# Patient Record
Sex: Female | Born: 1985 | Race: Black or African American | Hispanic: No | Marital: Single | Smoking: Never smoker
Health system: Southern US, Community
[De-identification: ages and names within clinical notes are randomized; demographics above are authoritative.]

## PROBLEM LIST (undated history)

## (undated) ENCOUNTER — Inpatient Hospital Stay (HOSPITAL_COMMUNITY)

## (undated) ENCOUNTER — Inpatient Hospital Stay (HOSPITAL_COMMUNITY): Payer: Self-pay

## (undated) DIAGNOSIS — R87629 Unspecified abnormal cytological findings in specimens from vagina: Secondary | ICD-10-CM

## (undated) DIAGNOSIS — D510 Vitamin B12 deficiency anemia due to intrinsic factor deficiency: Secondary | ICD-10-CM

## (undated) DIAGNOSIS — D219 Benign neoplasm of connective and other soft tissue, unspecified: Secondary | ICD-10-CM

## (undated) DIAGNOSIS — F419 Anxiety disorder, unspecified: Secondary | ICD-10-CM

## (undated) DIAGNOSIS — A549 Gonococcal infection, unspecified: Secondary | ICD-10-CM

## (undated) DIAGNOSIS — Z8719 Personal history of other diseases of the digestive system: Secondary | ICD-10-CM

## (undated) DIAGNOSIS — IMO0002 Reserved for concepts with insufficient information to code with codable children: Secondary | ICD-10-CM

## (undated) DIAGNOSIS — K219 Gastro-esophageal reflux disease without esophagitis: Secondary | ICD-10-CM

## (undated) DIAGNOSIS — B999 Unspecified infectious disease: Secondary | ICD-10-CM

## (undated) DIAGNOSIS — N83209 Unspecified ovarian cyst, unspecified side: Secondary | ICD-10-CM

## (undated) DIAGNOSIS — R51 Headache: Secondary | ICD-10-CM

## (undated) DIAGNOSIS — G35D Multiple sclerosis, unspecified: Secondary | ICD-10-CM

## (undated) DIAGNOSIS — E669 Obesity, unspecified: Secondary | ICD-10-CM

## (undated) DIAGNOSIS — N390 Urinary tract infection, site not specified: Secondary | ICD-10-CM

## (undated) DIAGNOSIS — O139 Gestational [pregnancy-induced] hypertension without significant proteinuria, unspecified trimester: Secondary | ICD-10-CM

## (undated) DIAGNOSIS — G35 Multiple sclerosis: Secondary | ICD-10-CM

## (undated) DIAGNOSIS — R519 Headache, unspecified: Secondary | ICD-10-CM

## (undated) DIAGNOSIS — G709 Myoneural disorder, unspecified: Secondary | ICD-10-CM

## (undated) DIAGNOSIS — R87619 Unspecified abnormal cytological findings in specimens from cervix uteri: Secondary | ICD-10-CM

## (undated) HISTORY — PX: WISDOM TOOTH EXTRACTION: SHX21

## (undated) HISTORY — DX: Multiple sclerosis: G35

## (undated) HISTORY — DX: Obesity, unspecified: E66.9

## (undated) HISTORY — DX: Multiple sclerosis, unspecified: G35.D

## (undated) HISTORY — DX: Vitamin B12 deficiency anemia due to intrinsic factor deficiency: D51.0

## (undated) HISTORY — PX: COLPOSCOPY: SHX161

## (undated) HISTORY — PX: TUBAL LIGATION: SHX77

---
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
## 2003-01-23 ENCOUNTER — Ambulatory Visit (HOSPITAL_COMMUNITY): Admission: RE | Admit: 2003-01-23 | Discharge: 2003-01-23 | Payer: Self-pay | Admitting: Family Medicine

## 2003-01-25 ENCOUNTER — Encounter: Admission: RE | Admit: 2003-01-25 | Discharge: 2003-01-25 | Payer: Self-pay | Admitting: Family Medicine

## 2003-02-26 ENCOUNTER — Encounter: Admission: RE | Admit: 2003-02-26 | Discharge: 2003-02-26 | Payer: Self-pay | Admitting: Family Medicine

## 2003-03-28 ENCOUNTER — Encounter: Admission: RE | Admit: 2003-03-28 | Discharge: 2003-03-28 | Payer: Self-pay | Admitting: Family Medicine

## 2003-04-10 ENCOUNTER — Encounter: Admission: RE | Admit: 2003-04-10 | Discharge: 2003-04-10 | Payer: Self-pay | Admitting: Sports Medicine

## 2003-04-17 ENCOUNTER — Encounter: Admission: RE | Admit: 2003-04-17 | Discharge: 2003-04-17 | Payer: Self-pay | Admitting: Family Medicine

## 2003-04-20 ENCOUNTER — Inpatient Hospital Stay (HOSPITAL_COMMUNITY): Admission: AD | Admit: 2003-04-20 | Discharge: 2003-04-20 | Payer: Self-pay | Admitting: Family Medicine

## 2003-04-21 ENCOUNTER — Inpatient Hospital Stay (HOSPITAL_COMMUNITY): Admission: AD | Admit: 2003-04-21 | Discharge: 2003-04-22 | Payer: Self-pay | Admitting: *Deleted

## 2003-04-27 ENCOUNTER — Encounter: Admission: RE | Admit: 2003-04-27 | Discharge: 2003-04-27 | Payer: Self-pay | Admitting: Family Medicine

## 2003-05-09 ENCOUNTER — Encounter: Admission: RE | Admit: 2003-05-09 | Discharge: 2003-05-09 | Payer: Self-pay | Admitting: Family Medicine

## 2003-05-24 ENCOUNTER — Encounter: Admission: RE | Admit: 2003-05-24 | Discharge: 2003-05-24 | Payer: Self-pay | Admitting: Family Medicine

## 2003-05-27 ENCOUNTER — Inpatient Hospital Stay (HOSPITAL_COMMUNITY): Admission: AD | Admit: 2003-05-27 | Discharge: 2003-05-27 | Payer: Self-pay | Admitting: Family Medicine

## 2003-05-30 ENCOUNTER — Inpatient Hospital Stay (HOSPITAL_COMMUNITY): Admission: AD | Admit: 2003-05-30 | Discharge: 2003-05-31 | Payer: Self-pay | Admitting: Obstetrics and Gynecology

## 2003-05-31 ENCOUNTER — Inpatient Hospital Stay (HOSPITAL_COMMUNITY): Admission: AD | Admit: 2003-05-31 | Discharge: 2003-05-31 | Payer: Self-pay | Admitting: Family Medicine

## 2003-05-31 ENCOUNTER — Encounter: Admission: RE | Admit: 2003-05-31 | Discharge: 2003-05-31 | Payer: Self-pay | Admitting: Family Medicine

## 2003-06-02 ENCOUNTER — Inpatient Hospital Stay (HOSPITAL_COMMUNITY): Admission: AD | Admit: 2003-06-02 | Discharge: 2003-06-06 | Payer: Self-pay | Admitting: Family Medicine

## 2003-06-08 ENCOUNTER — Inpatient Hospital Stay (HOSPITAL_COMMUNITY): Admission: AD | Admit: 2003-06-08 | Discharge: 2003-06-08 | Payer: Self-pay | Admitting: *Deleted

## 2003-06-11 ENCOUNTER — Inpatient Hospital Stay (HOSPITAL_COMMUNITY): Admission: AD | Admit: 2003-06-11 | Discharge: 2003-06-15 | Payer: Self-pay | Admitting: Obstetrics & Gynecology

## 2003-07-31 ENCOUNTER — Encounter (INDEPENDENT_AMBULATORY_CARE_PROVIDER_SITE_OTHER): Payer: Self-pay | Admitting: *Deleted

## 2003-07-31 ENCOUNTER — Encounter: Admission: RE | Admit: 2003-07-31 | Discharge: 2003-07-31 | Payer: Self-pay | Admitting: Sports Medicine

## 2003-07-31 ENCOUNTER — Other Ambulatory Visit: Admission: RE | Admit: 2003-07-31 | Discharge: 2003-07-31 | Payer: Self-pay | Admitting: Family Medicine

## 2003-09-17 ENCOUNTER — Encounter: Admission: RE | Admit: 2003-09-17 | Discharge: 2003-09-17 | Payer: Self-pay | Admitting: Family Medicine

## 2003-09-27 ENCOUNTER — Encounter: Admission: RE | Admit: 2003-09-27 | Discharge: 2003-09-27 | Payer: Self-pay | Admitting: Family Medicine

## 2004-01-17 ENCOUNTER — Encounter (INDEPENDENT_AMBULATORY_CARE_PROVIDER_SITE_OTHER): Payer: Self-pay | Admitting: Specialist

## 2004-01-17 ENCOUNTER — Other Ambulatory Visit: Admission: RE | Admit: 2004-01-17 | Discharge: 2004-01-17 | Payer: Self-pay | Admitting: Family Medicine

## 2004-01-17 ENCOUNTER — Encounter: Admission: RE | Admit: 2004-01-17 | Discharge: 2004-01-17 | Payer: Self-pay | Admitting: Family Medicine

## 2004-07-07 ENCOUNTER — Ambulatory Visit: Payer: Self-pay | Admitting: Family Medicine

## 2004-07-09 ENCOUNTER — Ambulatory Visit: Payer: Self-pay | Admitting: Family Medicine

## 2004-09-08 ENCOUNTER — Inpatient Hospital Stay (HOSPITAL_COMMUNITY): Admission: AD | Admit: 2004-09-08 | Discharge: 2004-09-08 | Payer: Self-pay | Admitting: Obstetrics & Gynecology

## 2004-09-11 ENCOUNTER — Inpatient Hospital Stay (HOSPITAL_COMMUNITY): Admission: AD | Admit: 2004-09-11 | Discharge: 2004-09-11 | Payer: Self-pay | Admitting: Obstetrics and Gynecology

## 2004-09-12 ENCOUNTER — Ambulatory Visit: Payer: Self-pay | Admitting: Sports Medicine

## 2004-09-26 ENCOUNTER — Emergency Department (HOSPITAL_COMMUNITY): Admission: EM | Admit: 2004-09-26 | Discharge: 2004-09-26 | Payer: Self-pay | Admitting: Emergency Medicine

## 2005-04-15 ENCOUNTER — Other Ambulatory Visit: Admission: RE | Admit: 2005-04-15 | Discharge: 2005-04-15 | Payer: Self-pay | Admitting: Family Medicine

## 2005-04-15 ENCOUNTER — Encounter (INDEPENDENT_AMBULATORY_CARE_PROVIDER_SITE_OTHER): Payer: Self-pay | Admitting: *Deleted

## 2005-04-15 ENCOUNTER — Ambulatory Visit: Payer: Self-pay | Admitting: Family Medicine

## 2005-04-15 LAB — CONVERTED CEMR LAB

## 2005-04-25 ENCOUNTER — Inpatient Hospital Stay (HOSPITAL_COMMUNITY): Admission: AD | Admit: 2005-04-25 | Discharge: 2005-04-25 | Payer: Self-pay | Admitting: Obstetrics and Gynecology

## 2005-08-21 ENCOUNTER — Inpatient Hospital Stay (HOSPITAL_COMMUNITY): Admission: AD | Admit: 2005-08-21 | Discharge: 2005-08-21 | Payer: Self-pay | Admitting: Obstetrics and Gynecology

## 2005-09-10 ENCOUNTER — Emergency Department (HOSPITAL_COMMUNITY): Admission: EM | Admit: 2005-09-10 | Discharge: 2005-09-10 | Payer: Self-pay | Admitting: Family Medicine

## 2005-10-15 ENCOUNTER — Inpatient Hospital Stay (HOSPITAL_COMMUNITY): Admission: AD | Admit: 2005-10-15 | Discharge: 2005-10-16 | Payer: Self-pay | Admitting: Gynecology

## 2005-10-30 ENCOUNTER — Ambulatory Visit: Payer: Self-pay | Admitting: Sports Medicine

## 2006-01-18 ENCOUNTER — Inpatient Hospital Stay (HOSPITAL_COMMUNITY): Admission: AD | Admit: 2006-01-18 | Discharge: 2006-01-18 | Payer: Self-pay | Admitting: Obstetrics & Gynecology

## 2006-01-22 ENCOUNTER — Ambulatory Visit: Payer: Self-pay | Admitting: Family Medicine

## 2006-08-12 DIAGNOSIS — E669 Obesity, unspecified: Secondary | ICD-10-CM | POA: Insufficient documentation

## 2006-08-13 ENCOUNTER — Encounter (INDEPENDENT_AMBULATORY_CARE_PROVIDER_SITE_OTHER): Payer: Self-pay | Admitting: *Deleted

## 2006-10-13 ENCOUNTER — Encounter (INDEPENDENT_AMBULATORY_CARE_PROVIDER_SITE_OTHER): Payer: Self-pay | Admitting: Specialist

## 2006-10-13 ENCOUNTER — Ambulatory Visit: Payer: Self-pay | Admitting: Family Medicine

## 2006-10-13 ENCOUNTER — Other Ambulatory Visit: Admission: RE | Admit: 2006-10-13 | Discharge: 2006-10-13 | Payer: Self-pay | Admitting: Family Medicine

## 2006-10-13 ENCOUNTER — Encounter (INDEPENDENT_AMBULATORY_CARE_PROVIDER_SITE_OTHER): Payer: Self-pay | Admitting: Family Medicine

## 2006-10-13 LAB — CONVERTED CEMR LAB
FSH: 3 milliintl units/mL
GC Probe Amp, Genital: NEGATIVE
LH: 2.2 milliintl units/mL
RBC: 3.86 M/uL
WBC: 5.4 10*3/uL
Whiff Test: POSITIVE

## 2006-10-14 ENCOUNTER — Encounter: Payer: Self-pay | Admitting: *Deleted

## 2006-10-15 ENCOUNTER — Ambulatory Visit: Payer: Self-pay | Admitting: Family Medicine

## 2006-10-15 ENCOUNTER — Encounter (INDEPENDENT_AMBULATORY_CARE_PROVIDER_SITE_OTHER): Payer: Self-pay | Admitting: Family Medicine

## 2006-10-15 ENCOUNTER — Encounter (INDEPENDENT_AMBULATORY_CARE_PROVIDER_SITE_OTHER): Payer: Self-pay | Admitting: *Deleted

## 2006-10-19 ENCOUNTER — Encounter (INDEPENDENT_AMBULATORY_CARE_PROVIDER_SITE_OTHER): Payer: Self-pay | Admitting: Family Medicine

## 2006-10-20 ENCOUNTER — Telehealth (INDEPENDENT_AMBULATORY_CARE_PROVIDER_SITE_OTHER): Payer: Self-pay | Admitting: *Deleted

## 2006-10-26 ENCOUNTER — Encounter: Admission: RE | Admit: 2006-10-26 | Discharge: 2006-10-26 | Payer: Self-pay | Admitting: Sports Medicine

## 2006-10-31 ENCOUNTER — Inpatient Hospital Stay (HOSPITAL_COMMUNITY): Admission: AD | Admit: 2006-10-31 | Discharge: 2006-10-31 | Payer: Self-pay | Admitting: Family Medicine

## 2006-11-01 ENCOUNTER — Ambulatory Visit: Payer: Self-pay | Admitting: Family Medicine

## 2006-11-01 ENCOUNTER — Telehealth: Payer: Self-pay | Admitting: *Deleted

## 2006-11-19 ENCOUNTER — Ambulatory Visit: Payer: Self-pay | Admitting: Family Medicine

## 2006-11-22 ENCOUNTER — Telehealth: Payer: Self-pay | Admitting: *Deleted

## 2006-11-29 ENCOUNTER — Encounter (INDEPENDENT_AMBULATORY_CARE_PROVIDER_SITE_OTHER): Payer: Self-pay | Admitting: *Deleted

## 2006-11-29 ENCOUNTER — Telehealth (INDEPENDENT_AMBULATORY_CARE_PROVIDER_SITE_OTHER): Payer: Self-pay | Admitting: *Deleted

## 2006-11-29 ENCOUNTER — Encounter (INDEPENDENT_AMBULATORY_CARE_PROVIDER_SITE_OTHER): Payer: Self-pay | Admitting: Family Medicine

## 2007-01-13 ENCOUNTER — Inpatient Hospital Stay (HOSPITAL_COMMUNITY): Admission: AD | Admit: 2007-01-13 | Discharge: 2007-01-13 | Payer: Self-pay | Admitting: Obstetrics and Gynecology

## 2007-06-27 ENCOUNTER — Inpatient Hospital Stay (HOSPITAL_COMMUNITY): Admission: AD | Admit: 2007-06-27 | Discharge: 2007-06-27 | Payer: Self-pay | Admitting: Family Medicine

## 2007-07-07 ENCOUNTER — Ambulatory Visit: Payer: Self-pay | Admitting: Family Medicine

## 2007-07-10 ENCOUNTER — Inpatient Hospital Stay (HOSPITAL_COMMUNITY): Admission: AD | Admit: 2007-07-10 | Discharge: 2007-07-10 | Payer: Self-pay | Admitting: Obstetrics & Gynecology

## 2007-07-29 ENCOUNTER — Ambulatory Visit: Payer: Self-pay | Admitting: Family Medicine

## 2007-07-29 DIAGNOSIS — R109 Unspecified abdominal pain: Secondary | ICD-10-CM | POA: Insufficient documentation

## 2007-08-01 ENCOUNTER — Telehealth: Payer: Self-pay | Admitting: *Deleted

## 2007-08-02 ENCOUNTER — Telehealth: Payer: Self-pay | Admitting: Family Medicine

## 2007-08-17 ENCOUNTER — Inpatient Hospital Stay (HOSPITAL_COMMUNITY): Admission: AD | Admit: 2007-08-17 | Discharge: 2007-08-18 | Payer: Self-pay | Admitting: Obstetrics & Gynecology

## 2007-08-18 ENCOUNTER — Telehealth: Payer: Self-pay | Admitting: Family Medicine

## 2007-08-19 ENCOUNTER — Ambulatory Visit: Payer: Self-pay | Admitting: Family Medicine

## 2007-09-05 ENCOUNTER — Encounter: Payer: Self-pay | Admitting: *Deleted

## 2007-10-11 ENCOUNTER — Inpatient Hospital Stay (HOSPITAL_COMMUNITY): Admission: AD | Admit: 2007-10-11 | Discharge: 2007-10-11 | Payer: Self-pay | Admitting: Gynecology

## 2007-10-14 ENCOUNTER — Telehealth: Payer: Self-pay | Admitting: *Deleted

## 2007-10-18 ENCOUNTER — Inpatient Hospital Stay (HOSPITAL_COMMUNITY): Admission: AD | Admit: 2007-10-18 | Discharge: 2007-10-18 | Payer: Self-pay | Admitting: Family Medicine

## 2007-10-20 ENCOUNTER — Telehealth: Payer: Self-pay | Admitting: *Deleted

## 2007-10-20 ENCOUNTER — Inpatient Hospital Stay (HOSPITAL_COMMUNITY): Admission: AD | Admit: 2007-10-20 | Discharge: 2007-10-20 | Payer: Self-pay | Admitting: Family Medicine

## 2007-10-25 ENCOUNTER — Encounter: Payer: Self-pay | Admitting: Family Medicine

## 2007-10-25 ENCOUNTER — Ambulatory Visit: Payer: Self-pay | Admitting: Family Medicine

## 2007-10-25 LAB — CONVERTED CEMR LAB
Antibody Screen: NEGATIVE
Basophils Relative: 0 % (ref 0–1)
Eosinophils Absolute: 0 10*3/uL (ref 0.0–0.7)
Eosinophils Relative: 1 % (ref 0–5)
HCT: 37.8 % (ref 36.0–46.0)
Lymphs Abs: 2.5 10*3/uL (ref 0.7–4.0)
MCHC: 31.7 g/dL (ref 30.0–36.0)
MCV: 96.4 fL (ref 78.0–100.0)
Monocytes Relative: 10 % (ref 3–12)
Platelets: 479 10*3/uL — ABNORMAL HIGH (ref 150–400)
RBC: 3.92 M/uL (ref 3.87–5.11)
Rh Type: POSITIVE
WBC: 7.4 10*3/uL (ref 4.0–10.5)

## 2007-10-29 ENCOUNTER — Inpatient Hospital Stay (HOSPITAL_COMMUNITY): Admission: AD | Admit: 2007-10-29 | Discharge: 2007-10-29 | Payer: Self-pay | Admitting: Gynecology

## 2007-11-01 ENCOUNTER — Encounter: Payer: Self-pay | Admitting: Family Medicine

## 2007-11-01 ENCOUNTER — Other Ambulatory Visit: Admission: RE | Admit: 2007-11-01 | Discharge: 2007-11-01 | Payer: Self-pay | Admitting: Family Medicine

## 2007-11-01 ENCOUNTER — Ambulatory Visit: Payer: Self-pay | Admitting: Family Medicine

## 2007-11-01 LAB — CONVERTED CEMR LAB: Pap Smear: NORMAL

## 2007-11-02 ENCOUNTER — Ambulatory Visit: Payer: Self-pay | Admitting: Family Medicine

## 2007-11-02 LAB — CONVERTED CEMR LAB
GC Probe Amp, Genital: POSITIVE — AB
hCG, Beta Chain, Quant, S: 66400.1 milliintl units/mL

## 2007-11-04 ENCOUNTER — Telehealth: Payer: Self-pay | Admitting: *Deleted

## 2007-11-04 ENCOUNTER — Encounter: Payer: Self-pay | Admitting: Family Medicine

## 2007-11-05 ENCOUNTER — Telehealth: Payer: Self-pay | Admitting: Family Medicine

## 2007-11-06 ENCOUNTER — Ambulatory Visit: Payer: Self-pay | Admitting: Family Medicine

## 2007-11-06 ENCOUNTER — Inpatient Hospital Stay (HOSPITAL_COMMUNITY): Admission: AD | Admit: 2007-11-06 | Discharge: 2007-11-09 | Payer: Self-pay | Admitting: Family Medicine

## 2007-11-15 ENCOUNTER — Telehealth: Payer: Self-pay | Admitting: *Deleted

## 2007-11-15 ENCOUNTER — Inpatient Hospital Stay (HOSPITAL_COMMUNITY): Admission: AD | Admit: 2007-11-15 | Discharge: 2007-11-22 | Payer: Self-pay | Admitting: Gynecology

## 2007-11-15 ENCOUNTER — Ambulatory Visit: Payer: Self-pay | Admitting: *Deleted

## 2007-11-21 ENCOUNTER — Telehealth: Payer: Self-pay | Admitting: Family Medicine

## 2007-11-22 ENCOUNTER — Encounter: Payer: Self-pay | Admitting: Family Medicine

## 2007-11-24 ENCOUNTER — Ambulatory Visit: Payer: Self-pay | Admitting: Family Medicine

## 2007-11-30 ENCOUNTER — Inpatient Hospital Stay (HOSPITAL_COMMUNITY): Admission: AD | Admit: 2007-11-30 | Discharge: 2007-11-30 | Payer: Self-pay | Admitting: Family Medicine

## 2007-11-30 ENCOUNTER — Ambulatory Visit: Payer: Self-pay | Admitting: Physician Assistant

## 2007-12-01 ENCOUNTER — Telehealth: Payer: Self-pay | Admitting: *Deleted

## 2007-12-01 ENCOUNTER — Ambulatory Visit: Payer: Self-pay | Admitting: Obstetrics & Gynecology

## 2007-12-06 ENCOUNTER — Ambulatory Visit: Payer: Self-pay | Admitting: Family Medicine

## 2007-12-06 LAB — CONVERTED CEMR LAB: Protein, U semiquant: NEGATIVE

## 2007-12-07 ENCOUNTER — Inpatient Hospital Stay (HOSPITAL_COMMUNITY): Admission: AD | Admit: 2007-12-07 | Discharge: 2007-12-08 | Payer: Self-pay | Admitting: Obstetrics and Gynecology

## 2007-12-15 ENCOUNTER — Inpatient Hospital Stay (HOSPITAL_COMMUNITY): Admission: AD | Admit: 2007-12-15 | Discharge: 2007-12-15 | Payer: Self-pay | Admitting: Obstetrics & Gynecology

## 2007-12-15 ENCOUNTER — Ambulatory Visit: Payer: Self-pay | Admitting: Obstetrics & Gynecology

## 2007-12-29 ENCOUNTER — Ambulatory Visit: Payer: Self-pay | Admitting: *Deleted

## 2008-01-09 ENCOUNTER — Ambulatory Visit: Payer: Self-pay | Admitting: Family Medicine

## 2008-01-09 ENCOUNTER — Encounter: Payer: Self-pay | Admitting: Family Medicine

## 2008-01-10 ENCOUNTER — Telehealth: Payer: Self-pay | Admitting: Family Medicine

## 2008-01-11 ENCOUNTER — Encounter: Payer: Self-pay | Admitting: Family Medicine

## 2008-01-12 ENCOUNTER — Encounter: Payer: Self-pay | Admitting: Family Medicine

## 2008-01-19 ENCOUNTER — Ambulatory Visit (HOSPITAL_COMMUNITY): Admission: RE | Admit: 2008-01-19 | Discharge: 2008-01-19 | Payer: Self-pay | Admitting: Family Medicine

## 2008-01-19 ENCOUNTER — Telehealth: Payer: Self-pay | Admitting: Family Medicine

## 2008-01-19 ENCOUNTER — Encounter: Payer: Self-pay | Admitting: Family Medicine

## 2008-01-20 ENCOUNTER — Ambulatory Visit: Payer: Self-pay | Admitting: Family Medicine

## 2008-01-20 ENCOUNTER — Encounter (INDEPENDENT_AMBULATORY_CARE_PROVIDER_SITE_OTHER): Payer: Self-pay | Admitting: *Deleted

## 2008-01-22 ENCOUNTER — Inpatient Hospital Stay (HOSPITAL_COMMUNITY): Admission: AD | Admit: 2008-01-22 | Discharge: 2008-01-22 | Payer: Self-pay | Admitting: Gynecology

## 2008-01-24 ENCOUNTER — Encounter: Payer: Self-pay | Admitting: Family Medicine

## 2008-02-02 ENCOUNTER — Ambulatory Visit (HOSPITAL_COMMUNITY): Admission: RE | Admit: 2008-02-02 | Discharge: 2008-02-02 | Payer: Self-pay | Admitting: Family Medicine

## 2008-02-07 ENCOUNTER — Ambulatory Visit: Payer: Self-pay | Admitting: Family Medicine

## 2008-02-07 LAB — CONVERTED CEMR LAB: Glucose, Urine, Semiquant: NEGATIVE

## 2008-02-24 ENCOUNTER — Ambulatory Visit: Payer: Self-pay | Admitting: Family Medicine

## 2008-02-24 LAB — CONVERTED CEMR LAB
Bilirubin Urine: NEGATIVE
Glucose, Urine, Semiquant: 100
Ketones, urine, test strip: NEGATIVE
pH: 7

## 2008-03-07 ENCOUNTER — Ambulatory Visit: Payer: Self-pay | Admitting: Family Medicine

## 2008-03-07 LAB — CONVERTED CEMR LAB: Glucose, Urine, Semiquant: NEGATIVE

## 2008-03-19 ENCOUNTER — Telehealth: Payer: Self-pay | Admitting: Family Medicine

## 2008-03-20 ENCOUNTER — Telehealth: Payer: Self-pay | Admitting: Family Medicine

## 2008-03-21 ENCOUNTER — Encounter: Payer: Self-pay | Admitting: Family Medicine

## 2008-03-21 ENCOUNTER — Ambulatory Visit: Payer: Self-pay | Admitting: Family Medicine

## 2008-03-21 DIAGNOSIS — IMO0002 Reserved for concepts with insufficient information to code with codable children: Secondary | ICD-10-CM | POA: Insufficient documentation

## 2008-03-21 LAB — CONVERTED CEMR LAB
Albumin: 3.5 g/dL (ref 3.5–5.2)
CO2: 21 meq/L (ref 19–32)
Glucose, Bld: 96 mg/dL (ref 70–99)
LDH: 124 units/L (ref 94–250)
MCV: 95.3 fL (ref 78.0–100.0)
Potassium: 4.3 meq/L (ref 3.5–5.3)
RBC: 3.22 M/uL — ABNORMAL LOW (ref 3.87–5.11)
Sodium: 138 meq/L (ref 135–145)
Total Bilirubin: 0.3 mg/dL (ref 0.3–1.2)
Total Protein: 6.5 g/dL (ref 6.0–8.3)
Uric Acid, Serum: 2.9 mg/dL (ref 2.4–7.0)
WBC: 8.7 10*3/uL (ref 4.0–10.5)

## 2008-03-23 ENCOUNTER — Encounter: Payer: Self-pay | Admitting: Family Medicine

## 2008-03-28 ENCOUNTER — Ambulatory Visit: Payer: Self-pay | Admitting: Family Medicine

## 2008-03-28 ENCOUNTER — Encounter: Payer: Self-pay | Admitting: Family Medicine

## 2008-04-03 ENCOUNTER — Inpatient Hospital Stay (HOSPITAL_COMMUNITY): Admission: AD | Admit: 2008-04-03 | Discharge: 2008-04-04 | Payer: Self-pay | Admitting: Obstetrics & Gynecology

## 2008-04-03 ENCOUNTER — Ambulatory Visit: Payer: Self-pay | Admitting: Advanced Practice Midwife

## 2008-04-04 ENCOUNTER — Telehealth: Payer: Self-pay | Admitting: *Deleted

## 2008-04-05 ENCOUNTER — Inpatient Hospital Stay (HOSPITAL_COMMUNITY): Admission: AD | Admit: 2008-04-05 | Discharge: 2008-04-05 | Payer: Self-pay | Admitting: Obstetrics and Gynecology

## 2008-04-09 ENCOUNTER — Telehealth: Payer: Self-pay | Admitting: *Deleted

## 2008-04-11 ENCOUNTER — Ambulatory Visit: Payer: Self-pay | Admitting: Family Medicine

## 2008-04-26 ENCOUNTER — Ambulatory Visit: Payer: Self-pay | Admitting: Family Medicine

## 2008-04-26 ENCOUNTER — Encounter: Payer: Self-pay | Admitting: Family Medicine

## 2008-04-26 LAB — CONVERTED CEMR LAB
ALT: <8 units/L (ref 0–35)
Albumin: 3.6 g/dL (ref 3.5–5.2)
Bilirubin Urine: NEGATIVE
Bilirubin, Direct: 0.1 mg/dL (ref 0.0–0.3)
Glucose, Urine, Semiquant: NEGATIVE
Hemoglobin: 9.7 g/dL — ABNORMAL LOW (ref 12.0–15.0)
Ketones, urine, test strip: NEGATIVE
MCHC: 30.1 g/dL (ref 30.0–36.0)
Nitrite: NEGATIVE
RDW: 13.5 % (ref 11.5–15.5)
Total Bilirubin: 0.5 mg/dL (ref 0.3–1.2)
Urobilinogen, UA: 1
WBC Urine, dipstick: NEGATIVE

## 2008-04-27 ENCOUNTER — Encounter: Payer: Self-pay | Admitting: Family Medicine

## 2008-04-27 ENCOUNTER — Inpatient Hospital Stay (HOSPITAL_COMMUNITY): Admission: AD | Admit: 2008-04-27 | Discharge: 2008-04-29 | Payer: Self-pay | Admitting: Obstetrics & Gynecology

## 2008-04-27 ENCOUNTER — Ambulatory Visit: Payer: Self-pay | Admitting: Obstetrics and Gynecology

## 2008-05-01 ENCOUNTER — Telehealth: Payer: Self-pay | Admitting: *Deleted

## 2008-05-01 ENCOUNTER — Ambulatory Visit: Payer: Self-pay | Admitting: Family Medicine

## 2008-05-08 ENCOUNTER — Encounter: Payer: Self-pay | Admitting: Family Medicine

## 2008-05-08 ENCOUNTER — Ambulatory Visit: Payer: Self-pay | Admitting: Family Medicine

## 2008-05-15 ENCOUNTER — Ambulatory Visit: Payer: Self-pay | Admitting: Advanced Practice Midwife

## 2008-05-15 ENCOUNTER — Telehealth: Payer: Self-pay | Admitting: *Deleted

## 2008-05-15 ENCOUNTER — Ambulatory Visit: Payer: Self-pay | Admitting: Family Medicine

## 2008-05-15 ENCOUNTER — Inpatient Hospital Stay (HOSPITAL_COMMUNITY): Admission: AD | Admit: 2008-05-15 | Discharge: 2008-05-15 | Payer: Self-pay | Admitting: Family Medicine

## 2008-05-17 ENCOUNTER — Telehealth: Payer: Self-pay | Admitting: *Deleted

## 2008-05-17 ENCOUNTER — Ambulatory Visit: Payer: Self-pay | Admitting: Physician Assistant

## 2008-05-17 ENCOUNTER — Inpatient Hospital Stay (HOSPITAL_COMMUNITY): Admission: AD | Admit: 2008-05-17 | Discharge: 2008-05-17 | Payer: Self-pay | Admitting: Obstetrics & Gynecology

## 2008-05-17 ENCOUNTER — Telehealth: Payer: Self-pay | Admitting: Family Medicine

## 2008-05-18 ENCOUNTER — Telehealth: Payer: Self-pay | Admitting: Family Medicine

## 2008-05-21 ENCOUNTER — Inpatient Hospital Stay (HOSPITAL_COMMUNITY): Admission: AD | Admit: 2008-05-21 | Discharge: 2008-05-23 | Payer: Self-pay | Admitting: Obstetrics & Gynecology

## 2008-05-21 ENCOUNTER — Ambulatory Visit: Payer: Self-pay | Admitting: Family Medicine

## 2008-05-21 ENCOUNTER — Encounter: Payer: Self-pay | Admitting: Family Medicine

## 2008-05-21 LAB — CONVERTED CEMR LAB

## 2008-05-22 LAB — CONVERTED CEMR LAB
BUN: 5 mg/dL — ABNORMAL LOW (ref 6–23)
CO2: 21 meq/L (ref 19–32)
Calcium: 8.7 mg/dL (ref 8.4–10.5)
Chloride: 105 meq/L (ref 96–112)
Creatinine, Ser: 0.56 mg/dL (ref 0.40–1.20)
HCT: 30 % — ABNORMAL LOW (ref 36.0–46.0)
Hemoglobin: 8.9 g/dL — ABNORMAL LOW (ref 12.0–15.0)
MCV: 98 fL (ref 78.0–100.0)
RDW: 13.9 % (ref 11.5–15.5)
Total Bilirubin: 0.4 mg/dL (ref 0.3–1.2)
Uric Acid, Serum: 3.5 mg/dL (ref 2.4–7.0)
WBC: 5.8 10*3/uL (ref 4.0–10.5)

## 2008-05-25 ENCOUNTER — Ambulatory Visit (HOSPITAL_COMMUNITY): Admission: RE | Admit: 2008-05-25 | Discharge: 2008-05-25 | Payer: Self-pay | Admitting: Obstetrics & Gynecology

## 2008-05-28 ENCOUNTER — Ambulatory Visit: Payer: Self-pay | Admitting: Family Medicine

## 2008-05-30 ENCOUNTER — Inpatient Hospital Stay (HOSPITAL_COMMUNITY): Admission: AD | Admit: 2008-05-30 | Discharge: 2008-06-02 | Payer: Self-pay | Admitting: Obstetrics and Gynecology

## 2008-05-30 ENCOUNTER — Ambulatory Visit: Payer: Self-pay | Admitting: Physician Assistant

## 2008-06-18 ENCOUNTER — Telehealth (INDEPENDENT_AMBULATORY_CARE_PROVIDER_SITE_OTHER): Payer: Self-pay | Admitting: *Deleted

## 2008-07-09 ENCOUNTER — Telehealth: Payer: Self-pay | Admitting: Family Medicine

## 2008-07-09 ENCOUNTER — Encounter: Payer: Self-pay | Admitting: Family Medicine

## 2008-07-10 ENCOUNTER — Encounter: Payer: Self-pay | Admitting: Family Medicine

## 2008-07-10 ENCOUNTER — Telehealth (INDEPENDENT_AMBULATORY_CARE_PROVIDER_SITE_OTHER): Payer: Self-pay | Admitting: *Deleted

## 2008-07-19 ENCOUNTER — Ambulatory Visit: Payer: Self-pay | Admitting: Family Medicine

## 2008-07-20 ENCOUNTER — Telehealth: Payer: Self-pay | Admitting: Family Medicine

## 2008-07-23 ENCOUNTER — Encounter: Payer: Self-pay | Admitting: Family Medicine

## 2008-07-23 ENCOUNTER — Ambulatory Visit: Payer: Self-pay | Admitting: Family Medicine

## 2008-07-23 DIAGNOSIS — R1115 Cyclical vomiting syndrome unrelated to migraine: Secondary | ICD-10-CM

## 2008-07-23 LAB — CONVERTED CEMR LAB: Beta hcg, urine, semiquantitative: NEGATIVE

## 2008-10-19 ENCOUNTER — Telehealth: Payer: Self-pay | Admitting: *Deleted

## 2008-10-29 ENCOUNTER — Telehealth: Payer: Self-pay | Admitting: Family Medicine

## 2008-11-26 ENCOUNTER — Inpatient Hospital Stay (HOSPITAL_COMMUNITY): Admission: AD | Admit: 2008-11-26 | Discharge: 2008-11-26 | Payer: Self-pay | Admitting: Obstetrics & Gynecology

## 2008-12-21 ENCOUNTER — Encounter: Payer: Self-pay | Admitting: Family Medicine

## 2008-12-25 ENCOUNTER — Encounter: Payer: Self-pay | Admitting: Family Medicine

## 2009-04-02 ENCOUNTER — Inpatient Hospital Stay (HOSPITAL_COMMUNITY): Admission: AD | Admit: 2009-04-02 | Discharge: 2009-04-02 | Payer: Self-pay | Admitting: Obstetrics & Gynecology

## 2009-05-07 ENCOUNTER — Encounter: Payer: Self-pay | Admitting: Family Medicine

## 2009-05-07 ENCOUNTER — Ambulatory Visit: Payer: Self-pay | Admitting: Family Medicine

## 2009-05-07 LAB — CONVERTED CEMR LAB: Beta hcg, urine, semiquantitative: NEGATIVE

## 2009-05-20 ENCOUNTER — Telehealth: Payer: Self-pay | Admitting: Family Medicine

## 2009-05-22 ENCOUNTER — Inpatient Hospital Stay (HOSPITAL_COMMUNITY): Admission: AD | Admit: 2009-05-22 | Discharge: 2009-05-22 | Payer: Self-pay | Admitting: Obstetrics & Gynecology

## 2009-05-22 ENCOUNTER — Encounter: Payer: Self-pay | Admitting: Family Medicine

## 2009-06-04 ENCOUNTER — Encounter: Payer: Self-pay | Admitting: Family Medicine

## 2009-06-11 ENCOUNTER — Telehealth: Payer: Self-pay | Admitting: Family Medicine

## 2009-07-25 ENCOUNTER — Ambulatory Visit: Payer: Self-pay | Admitting: Family Medicine

## 2009-07-25 DIAGNOSIS — N83209 Unspecified ovarian cyst, unspecified side: Secondary | ICD-10-CM

## 2009-07-30 ENCOUNTER — Emergency Department (HOSPITAL_COMMUNITY): Admission: EM | Admit: 2009-07-30 | Discharge: 2009-07-30 | Payer: Self-pay | Admitting: Emergency Medicine

## 2009-10-08 ENCOUNTER — Encounter: Payer: Self-pay | Admitting: Family Medicine

## 2009-10-23 ENCOUNTER — Inpatient Hospital Stay (HOSPITAL_COMMUNITY): Admission: AD | Admit: 2009-10-23 | Discharge: 2009-10-23 | Payer: Self-pay | Admitting: Obstetrics & Gynecology

## 2009-10-25 ENCOUNTER — Encounter: Payer: Self-pay | Admitting: Family Medicine

## 2009-10-25 ENCOUNTER — Ambulatory Visit: Payer: Self-pay | Admitting: Family Medicine

## 2009-10-25 LAB — CONVERTED CEMR LAB
AST: 16 units/L (ref 0–37)
Albumin: 4.5 g/dL (ref 3.5–5.2)
Alkaline Phosphatase: 38 units/L — ABNORMAL LOW (ref 39–117)
BUN: 10 mg/dL (ref 6–23)
Hemoglobin: 12.1 g/dL (ref 12.0–15.0)
MCHC: 30.6 g/dL (ref 30.0–36.0)
MCV: 93.4 fL (ref 78.0–100.0)
Potassium: 4.3 meq/L (ref 3.5–5.3)
RDW: 12.4 % (ref 11.5–15.5)
Total Bilirubin: 0.5 mg/dL (ref 0.3–1.2)

## 2009-10-31 ENCOUNTER — Ambulatory Visit (HOSPITAL_COMMUNITY): Admission: RE | Admit: 2009-10-31 | Discharge: 2009-10-31 | Payer: Self-pay | Admitting: Family Medicine

## 2009-10-31 ENCOUNTER — Telehealth: Payer: Self-pay | Admitting: *Deleted

## 2009-12-17 ENCOUNTER — Telehealth (INDEPENDENT_AMBULATORY_CARE_PROVIDER_SITE_OTHER): Payer: Self-pay | Admitting: Family Medicine

## 2010-01-04 ENCOUNTER — Emergency Department (HOSPITAL_COMMUNITY): Admission: EM | Admit: 2010-01-04 | Discharge: 2010-01-04 | Payer: Self-pay | Admitting: Emergency Medicine

## 2010-01-13 ENCOUNTER — Ambulatory Visit: Payer: Self-pay | Admitting: Nurse Practitioner

## 2010-01-13 ENCOUNTER — Inpatient Hospital Stay (HOSPITAL_COMMUNITY): Admission: AD | Admit: 2010-01-13 | Discharge: 2010-01-13 | Payer: Self-pay | Admitting: Obstetrics & Gynecology

## 2010-02-11 ENCOUNTER — Inpatient Hospital Stay (HOSPITAL_COMMUNITY): Admission: AD | Admit: 2010-02-11 | Discharge: 2010-02-11 | Payer: Self-pay | Admitting: Obstetrics & Gynecology

## 2010-02-11 ENCOUNTER — Ambulatory Visit: Payer: Self-pay | Admitting: Obstetrics and Gynecology

## 2010-02-13 ENCOUNTER — Encounter: Payer: Self-pay | Admitting: Family Medicine

## 2010-02-13 ENCOUNTER — Ambulatory Visit: Payer: Self-pay | Admitting: Family Medicine

## 2010-02-13 DIAGNOSIS — O30009 Twin pregnancy, unspecified number of placenta and unspecified number of amniotic sacs, unspecified trimester: Secondary | ICD-10-CM | POA: Insufficient documentation

## 2010-02-13 LAB — CONVERTED CEMR LAB
Antibody Screen: NEGATIVE
Basophils Relative: 0 % (ref 0–1)
Eosinophils Absolute: 0 10*3/uL (ref 0.0–0.7)
Eosinophils Relative: 0 % (ref 0–5)
HCT: 35.3 % — ABNORMAL LOW (ref 36.0–46.0)
Hemoglobin: 11.5 g/dL — ABNORMAL LOW (ref 12.0–15.0)
Lymphs Abs: 2.4 10*3/uL (ref 0.7–4.0)
MCHC: 32.6 g/dL (ref 30.0–36.0)
MCV: 94.1 fL (ref 78.0–100.0)
Monocytes Absolute: 0.4 10*3/uL (ref 0.1–1.0)
Monocytes Relative: 6 % (ref 3–12)
Neutrophils Relative %: 62 % (ref 43–77)
RBC: 3.75 M/uL — ABNORMAL LOW (ref 3.87–5.11)
Rh Type: POSITIVE
Sickle Cell Screen: NEGATIVE

## 2010-02-20 ENCOUNTER — Inpatient Hospital Stay (HOSPITAL_COMMUNITY): Admission: AD | Admit: 2010-02-20 | Discharge: 2010-02-20 | Payer: Self-pay | Admitting: Obstetrics & Gynecology

## 2010-02-24 ENCOUNTER — Ambulatory Visit: Payer: Self-pay | Admitting: Family Medicine

## 2010-02-24 ENCOUNTER — Telehealth: Payer: Self-pay | Admitting: Family Medicine

## 2010-02-24 LAB — CONVERTED CEMR LAB
Bilirubin Urine: NEGATIVE
Glucose, Urine, Semiquant: NEGATIVE
WBC Urine, dipstick: NEGATIVE
pH: 8.5

## 2010-02-26 ENCOUNTER — Observation Stay (HOSPITAL_COMMUNITY): Admission: AD | Admit: 2010-02-26 | Discharge: 2010-02-28 | Payer: Self-pay | Admitting: Obstetrics & Gynecology

## 2010-02-26 ENCOUNTER — Encounter: Payer: Self-pay | Admitting: Family Medicine

## 2010-02-26 LAB — CONVERTED CEMR LAB
Chlamydia, DNA Probe: NEGATIVE
Pap Smear: NEGATIVE

## 2010-02-28 ENCOUNTER — Encounter: Payer: Self-pay | Admitting: Family Medicine

## 2010-03-03 ENCOUNTER — Inpatient Hospital Stay (HOSPITAL_COMMUNITY): Admission: AD | Admit: 2010-03-03 | Discharge: 2010-03-05 | Payer: Self-pay | Admitting: Obstetrics & Gynecology

## 2010-03-03 ENCOUNTER — Telehealth: Payer: Self-pay | Admitting: Family Medicine

## 2010-03-06 ENCOUNTER — Ambulatory Visit: Payer: Self-pay | Admitting: Obstetrics and Gynecology

## 2010-03-06 ENCOUNTER — Encounter (INDEPENDENT_AMBULATORY_CARE_PROVIDER_SITE_OTHER): Payer: Self-pay | Admitting: Family Medicine

## 2010-03-09 ENCOUNTER — Inpatient Hospital Stay (HOSPITAL_COMMUNITY): Admission: AD | Admit: 2010-03-09 | Discharge: 2010-03-09 | Payer: Self-pay | Admitting: Obstetrics and Gynecology

## 2010-03-12 ENCOUNTER — Encounter: Payer: Self-pay | Admitting: Family Medicine

## 2010-03-12 ENCOUNTER — Ambulatory Visit: Payer: Self-pay | Admitting: Obstetrics and Gynecology

## 2010-03-12 ENCOUNTER — Inpatient Hospital Stay (HOSPITAL_COMMUNITY): Admission: AD | Admit: 2010-03-12 | Discharge: 2010-03-14 | Payer: Self-pay | Admitting: Family Medicine

## 2010-03-16 ENCOUNTER — Inpatient Hospital Stay (HOSPITAL_COMMUNITY): Admission: AD | Admit: 2010-03-16 | Discharge: 2010-03-17 | Payer: Self-pay | Admitting: Obstetrics & Gynecology

## 2010-03-16 ENCOUNTER — Ambulatory Visit: Payer: Self-pay | Admitting: Family

## 2010-03-18 ENCOUNTER — Ambulatory Visit: Payer: Self-pay | Admitting: Obstetrics and Gynecology

## 2010-03-18 ENCOUNTER — Ambulatory Visit: Payer: Self-pay | Admitting: Advanced Practice Midwife

## 2010-03-18 ENCOUNTER — Inpatient Hospital Stay (HOSPITAL_COMMUNITY): Admission: AD | Admit: 2010-03-18 | Discharge: 2010-03-21 | Payer: Self-pay | Admitting: Obstetrics & Gynecology

## 2010-03-24 ENCOUNTER — Ambulatory Visit: Payer: Self-pay | Admitting: Nurse Practitioner

## 2010-03-24 ENCOUNTER — Encounter (INDEPENDENT_AMBULATORY_CARE_PROVIDER_SITE_OTHER): Payer: Self-pay | Admitting: Family Medicine

## 2010-03-24 ENCOUNTER — Ambulatory Visit: Payer: Self-pay | Admitting: Obstetrics and Gynecology

## 2010-03-24 ENCOUNTER — Inpatient Hospital Stay (HOSPITAL_COMMUNITY): Admission: AD | Admit: 2010-03-24 | Discharge: 2010-03-24 | Payer: Self-pay | Admitting: Obstetrics & Gynecology

## 2010-03-26 ENCOUNTER — Inpatient Hospital Stay (HOSPITAL_COMMUNITY): Admission: AD | Admit: 2010-03-26 | Discharge: 2010-03-26 | Payer: Self-pay | Admitting: Obstetrics & Gynecology

## 2010-03-26 ENCOUNTER — Ambulatory Visit: Payer: Self-pay | Admitting: Obstetrics & Gynecology

## 2010-03-31 ENCOUNTER — Ambulatory Visit: Payer: Self-pay | Admitting: Obstetrics & Gynecology

## 2010-04-01 ENCOUNTER — Encounter: Payer: Self-pay | Admitting: Obstetrics & Gynecology

## 2010-04-14 ENCOUNTER — Ambulatory Visit: Payer: Self-pay | Admitting: Family Medicine

## 2010-04-21 ENCOUNTER — Ambulatory Visit: Payer: Self-pay | Admitting: Obstetrics & Gynecology

## 2010-04-29 ENCOUNTER — Ambulatory Visit (HOSPITAL_COMMUNITY): Admission: RE | Admit: 2010-04-29 | Discharge: 2010-04-29 | Payer: Self-pay | Admitting: Family Medicine

## 2010-04-29 ENCOUNTER — Encounter: Payer: Self-pay | Admitting: Family Medicine

## 2010-05-05 ENCOUNTER — Ambulatory Visit: Payer: Self-pay | Admitting: Obstetrics & Gynecology

## 2010-05-13 ENCOUNTER — Ambulatory Visit (HOSPITAL_COMMUNITY)
Admission: RE | Admit: 2010-05-13 | Discharge: 2010-05-13 | Payer: Self-pay | Source: Home / Self Care | Admitting: Family Medicine

## 2010-05-19 ENCOUNTER — Ambulatory Visit: Payer: Self-pay | Admitting: Obstetrics & Gynecology

## 2010-05-26 ENCOUNTER — Ambulatory Visit (HOSPITAL_COMMUNITY)
Admission: RE | Admit: 2010-05-26 | Discharge: 2010-05-26 | Payer: Self-pay | Source: Home / Self Care | Attending: Family Medicine | Admitting: Family Medicine

## 2010-06-02 ENCOUNTER — Ambulatory Visit: Payer: Self-pay | Admitting: Obstetrics and Gynecology

## 2010-06-02 ENCOUNTER — Encounter: Payer: Self-pay | Admitting: Obstetrics & Gynecology

## 2010-06-06 ENCOUNTER — Encounter: Payer: Self-pay | Admitting: Family Medicine

## 2010-06-06 ENCOUNTER — Ambulatory Visit (HOSPITAL_COMMUNITY)
Admission: RE | Admit: 2010-06-06 | Discharge: 2010-06-06 | Payer: Self-pay | Source: Home / Self Care | Attending: Family Medicine | Admitting: Family Medicine

## 2010-06-13 ENCOUNTER — Inpatient Hospital Stay (HOSPITAL_COMMUNITY)
Admission: AD | Admit: 2010-06-13 | Discharge: 2010-06-14 | Payer: Self-pay | Source: Home / Self Care | Attending: Obstetrics & Gynecology | Admitting: Obstetrics & Gynecology

## 2010-06-16 ENCOUNTER — Ambulatory Visit: Payer: Self-pay | Admitting: Obstetrics & Gynecology

## 2010-06-17 ENCOUNTER — Encounter (INDEPENDENT_AMBULATORY_CARE_PROVIDER_SITE_OTHER): Payer: Self-pay | Admitting: *Deleted

## 2010-06-17 ENCOUNTER — Encounter: Payer: Self-pay | Admitting: Obstetrics & Gynecology

## 2010-06-17 LAB — CONVERTED CEMR LAB
BUN: 4 mg/dL — ABNORMAL LOW (ref 6–23)
CO2: 21 meq/L (ref 19–32)
Calcium: 9.1 mg/dL (ref 8.4–10.5)
Collection Interval-CRCL: 24 hr
Creatinine Clearance: 188 mL/min — ABNORMAL HIGH (ref 75–115)
Creatinine, Ser: 0.65 mg/dL (ref 0.40–1.20)
Creatinine, Urine: 127.8 mg/dL
Glucose, Bld: 120 mg/dL — ABNORMAL HIGH (ref 70–99)
HCT: 29.3 % — ABNORMAL LOW (ref 36.0–46.0)
Hemoglobin: 8.9 g/dL — ABNORMAL LOW (ref 12.0–15.0)
MCV: 97.3 fL (ref 78.0–100.0)
Protein, 24H Urine: 83 mg/24hr (ref 50–100)
RBC: 3.01 M/uL — ABNORMAL LOW (ref 3.87–5.11)
Total Bilirubin: 0.3 mg/dL (ref 0.3–1.2)
WBC: 8.3 10*3/uL (ref 4.0–10.5)

## 2010-06-19 ENCOUNTER — Ambulatory Visit
Admission: RE | Admit: 2010-06-19 | Discharge: 2010-06-19 | Payer: Self-pay | Source: Home / Self Care | Attending: Obstetrics & Gynecology | Admitting: Obstetrics & Gynecology

## 2010-06-19 LAB — POCT URINALYSIS DIPSTICK
Bilirubin Urine: NEGATIVE
Ketones, ur: NEGATIVE mg/dL
Nitrite: NEGATIVE
Protein, ur: NEGATIVE mg/dL
Specific Gravity, Urine: 1.02 (ref 1.005–1.030)
Urine Glucose, Fasting: NEGATIVE mg/dL
Urobilinogen, UA: 0.2 mg/dL (ref 0.0–1.0)
pH: 7 (ref 5.0–8.0)

## 2010-06-20 ENCOUNTER — Encounter (INDEPENDENT_AMBULATORY_CARE_PROVIDER_SITE_OTHER): Payer: Self-pay | Admitting: Family Medicine

## 2010-06-23 ENCOUNTER — Encounter: Payer: Self-pay | Admitting: Family Medicine

## 2010-06-23 ENCOUNTER — Inpatient Hospital Stay (HOSPITAL_COMMUNITY)
Admission: AD | Admit: 2010-06-23 | Discharge: 2010-06-25 | Payer: Self-pay | Source: Home / Self Care | Attending: Obstetrics and Gynecology | Admitting: Obstetrics and Gynecology

## 2010-06-23 ENCOUNTER — Ambulatory Visit (HOSPITAL_COMMUNITY)
Admission: RE | Admit: 2010-06-23 | Discharge: 2010-06-23 | Payer: Self-pay | Source: Home / Self Care | Attending: Family Medicine | Admitting: Family Medicine

## 2010-06-25 ENCOUNTER — Encounter: Payer: Self-pay | Admitting: Family Medicine

## 2010-06-25 ENCOUNTER — Ambulatory Visit (HOSPITAL_COMMUNITY)
Admission: RE | Admit: 2010-06-25 | Discharge: 2010-06-25 | Payer: Self-pay | Source: Home / Self Care | Attending: Obstetrics and Gynecology | Admitting: Obstetrics and Gynecology

## 2010-06-26 ENCOUNTER — Ambulatory Visit: Admit: 2010-06-26 | Payer: Self-pay | Admitting: Obstetrics and Gynecology

## 2010-06-26 ENCOUNTER — Ambulatory Visit
Admission: RE | Admit: 2010-06-26 | Discharge: 2010-06-26 | Payer: Self-pay | Source: Home / Self Care | Attending: Obstetrics & Gynecology | Admitting: Obstetrics & Gynecology

## 2010-06-28 ENCOUNTER — Inpatient Hospital Stay (HOSPITAL_COMMUNITY)
Admission: AD | Admit: 2010-06-28 | Discharge: 2010-06-28 | Payer: Self-pay | Source: Home / Self Care | Attending: Obstetrics & Gynecology | Admitting: Obstetrics & Gynecology

## 2010-06-30 LAB — WET PREP, GENITAL
Trich, Wet Prep: NONE SEEN
Yeast Wet Prep HPF POC: NONE SEEN

## 2010-06-30 LAB — DIFFERENTIAL
Basophils Absolute: 0 10*3/uL (ref 0.0–0.1)
Basophils Relative: 0 % (ref 0–1)
Eosinophils Absolute: 0 10*3/uL (ref 0.0–0.7)
Eosinophils Relative: 0 % (ref 0–5)
Lymphocytes Relative: 18 % (ref 12–46)
Lymphs Abs: 1.7 10*3/uL (ref 0.7–4.0)
Monocytes Absolute: 0.6 10*3/uL (ref 0.1–1.0)
Monocytes Relative: 6 % (ref 3–12)
Neutro Abs: 7.2 10*3/uL (ref 1.7–7.7)
Neutrophils Relative %: 76 % (ref 43–77)

## 2010-06-30 LAB — PROTEIN, URINE, 24 HOUR
Collection Interval-UPROT: 24 hours
Protein, Ur: 117 mg/d — ABNORMAL HIGH (ref 50–100)
Protein, Urine: 6 mg/dL
Urine Total Volume-UPROT: 1950 mL

## 2010-06-30 LAB — CREATININE CLEARANCE, URINE, 24 HOUR
Collection Interval-CRCL: 24 hours
Creatinine Clearance: 196 mL/min — ABNORMAL HIGH (ref 75–115)
Creatinine, 24H Ur: 1661 mg/d (ref 700–1800)
Creatinine, Urine: 85.2 mg/dL
Creatinine: 0.59 mg/dL (ref 0.4–1.2)
Urine Total Volume-CRCL: 1950 mL

## 2010-06-30 LAB — COMPREHENSIVE METABOLIC PANEL
ALT: 10 U/L (ref 0–35)
AST: 19 U/L (ref 0–37)
Albumin: 2.7 g/dL — ABNORMAL LOW (ref 3.5–5.2)
Alkaline Phosphatase: 41 U/L (ref 39–117)
BUN: 3 mg/dL — ABNORMAL LOW (ref 6–23)
CO2: 23 mEq/L (ref 19–32)
Calcium: 8.9 mg/dL (ref 8.4–10.5)
Chloride: 101 mEq/L (ref 96–112)
Creatinine, Ser: 0.59 mg/dL (ref 0.4–1.2)
GFR calc Af Amer: >60 mL/min (ref >60)
GFR calc non Af Amer: >60 mL/min (ref >60)
Glucose, Bld: 116 mg/dL — ABNORMAL HIGH (ref 70–99)
Potassium: 3.7 mEq/L (ref 3.5–5.1)
Sodium: 133 mEq/L — ABNORMAL LOW (ref 135–145)
Total Bilirubin: 0.4 mg/dL (ref 0.3–1.2)
Total Protein: 6 g/dL (ref 6.0–8.3)

## 2010-06-30 LAB — POCT URINALYSIS DIPSTICK
Bilirubin Urine: NEGATIVE
Ketones, ur: NEGATIVE mg/dL
Nitrite: NEGATIVE
Protein, ur: NEGATIVE mg/dL
Specific Gravity, Urine: 1.02 (ref 1.005–1.030)
Urine Glucose, Fasting: NEGATIVE mg/dL
Urobilinogen, UA: 0.2 mg/dL (ref 0.0–1.0)
pH: 8.5 — ABNORMAL HIGH (ref 5.0–8.0)

## 2010-06-30 LAB — CBC
HCT: 27.3 % — ABNORMAL LOW (ref 36.0–46.0)
Hemoglobin: 8.6 g/dL — ABNORMAL LOW (ref 12.0–15.0)
MCH: 29.5 pg (ref 26.0–34.0)
MCHC: 31.5 g/dL (ref 30.0–36.0)
MCV: 93.5 fL (ref 78.0–100.0)
Platelets: 327 10*3/uL (ref 150–400)
RBC: 2.92 MIL/uL — ABNORMAL LOW (ref 3.87–5.11)
RDW: 13.3 % (ref 11.5–15.5)
WBC: 9.5 10*3/uL (ref 4.0–10.5)

## 2010-07-03 ENCOUNTER — Ambulatory Visit (HOSPITAL_COMMUNITY)
Admission: RE | Admit: 2010-07-03 | Discharge: 2010-07-03 | Payer: Self-pay | Source: Home / Self Care | Attending: Obstetrics and Gynecology | Admitting: Obstetrics and Gynecology

## 2010-07-03 ENCOUNTER — Inpatient Hospital Stay (HOSPITAL_COMMUNITY)
Admission: AD | Admit: 2010-07-03 | Discharge: 2010-07-03 | Payer: Self-pay | Source: Home / Self Care | Attending: Obstetrics & Gynecology | Admitting: Obstetrics & Gynecology

## 2010-07-03 ENCOUNTER — Ambulatory Visit
Admission: RE | Admit: 2010-07-03 | Discharge: 2010-07-03 | Payer: Self-pay | Source: Home / Self Care | Attending: Family Medicine | Admitting: Family Medicine

## 2010-07-05 ENCOUNTER — Encounter: Payer: Self-pay | Admitting: *Deleted

## 2010-07-05 ENCOUNTER — Other Ambulatory Visit: Payer: Self-pay | Admitting: Obstetrics and Gynecology

## 2010-07-05 ENCOUNTER — Encounter: Payer: Self-pay | Admitting: Sports Medicine

## 2010-07-05 DIAGNOSIS — O30009 Twin pregnancy, unspecified number of placenta and unspecified number of amniotic sacs, unspecified trimester: Secondary | ICD-10-CM

## 2010-07-06 ENCOUNTER — Inpatient Hospital Stay (HOSPITAL_COMMUNITY)
Admission: AD | Admit: 2010-07-06 | Discharge: 2010-07-15 | Disposition: A | Discharge status: Home / Self Care | Payer: Self-pay | Attending: Obstetrics and Gynecology | Admitting: Obstetrics and Gynecology

## 2010-07-06 ENCOUNTER — Encounter: Payer: Self-pay | Admitting: Family Medicine

## 2010-07-06 ENCOUNTER — Encounter: Payer: Self-pay | Admitting: Sports Medicine

## 2010-07-07 ENCOUNTER — Ambulatory Visit: Admit: 2010-07-07 | Payer: Self-pay | Admitting: Obstetrics & Gynecology

## 2010-07-07 LAB — CBC
HCT: 26.8 % — ABNORMAL LOW (ref 36.0–46.0)
MCH: 29.1 pg (ref 26.0–34.0)
MCV: 91.8 fL (ref 78.0–100.0)
Platelets: 349 10*3/uL (ref 150–400)
RDW: 13.9 % (ref 11.5–15.5)

## 2010-07-07 LAB — COMPREHENSIVE METABOLIC PANEL
Albumin: 2.8 g/dL — ABNORMAL LOW (ref 3.5–5.2)
BUN: 3 mg/dL — ABNORMAL LOW (ref 6–23)
Creatinine, Ser: 0.79 mg/dL (ref 0.4–1.2)
Glucose, Bld: 100 mg/dL — ABNORMAL HIGH (ref 70–99)
Total Bilirubin: 0.2 mg/dL — ABNORMAL LOW (ref 0.3–1.2)
Total Protein: 6.7 g/dL (ref 6.0–8.3)

## 2010-07-07 LAB — PROTEIN / CREATININE RATIO, URINE
Creatinine, Urine: 242.9 mg/dL
Protein Creatinine Ratio: 0.1 (ref 0.00–0.15)
Total Protein, Urine: 25 mg/dL

## 2010-07-07 LAB — POCT URINALYSIS DIPSTICK
Protein, ur: 30 mg/dL — AB
Specific Gravity, Urine: 1.02 (ref 1.005–1.030)
Urobilinogen, UA: 1 mg/dL (ref 0.0–1.0)
pH: 7 (ref 5.0–8.0)

## 2010-07-08 LAB — COMPREHENSIVE METABOLIC PANEL
Albumin: 2.6 g/dL — ABNORMAL LOW (ref 3.5–5.2)
Alkaline Phosphatase: 54 U/L (ref 39–117)
BUN: 5 mg/dL — ABNORMAL LOW (ref 6–23)
CO2: 24 mEq/L (ref 19–32)
Chloride: 101 mEq/L (ref 96–112)
Creatinine, Ser: 0.64 mg/dL (ref 0.4–1.2)
GFR calc non Af Amer: >60 mL/min (ref >60)
Glucose, Bld: 131 mg/dL — ABNORMAL HIGH (ref 70–99)
Potassium: 3.5 mEq/L (ref 3.5–5.1)
Total Bilirubin: 0.4 mg/dL (ref 0.3–1.2)

## 2010-07-08 LAB — URINALYSIS, ROUTINE W REFLEX MICROSCOPIC
Bilirubin Urine: NEGATIVE
Nitrite: NEGATIVE
Protein, ur: NEGATIVE mg/dL
Specific Gravity, Urine: 1.025 (ref 1.005–1.030)
Urobilinogen, UA: 0.2 mg/dL (ref 0.0–1.0)

## 2010-07-08 LAB — URINE MICROSCOPIC-ADD ON

## 2010-07-08 LAB — CREATININE CLEARANCE, URINE, 24 HOUR
Collection Interval-CRCL: 24 hours
Creatinine Clearance: 210 mL/min — ABNORMAL HIGH (ref 75–115)
Creatinine, Urine: 138 mg/dL
Urine Total Volume-CRCL: 1400 mL

## 2010-07-08 LAB — PROTEIN, URINE, 24 HOUR
Protein, Ur: 126 mg/d — ABNORMAL HIGH (ref 50–100)
Protein, Urine: 9 mg/dL

## 2010-07-08 LAB — CBC
HCT: 27 % — ABNORMAL LOW (ref 36.0–46.0)
Hemoglobin: 8.5 g/dL — ABNORMAL LOW (ref 12.0–15.0)
MCH: 28.6 pg (ref 26.0–34.0)
MCV: 90.9 fL (ref 78.0–100.0)
Platelets: 358 10*3/uL (ref 150–400)
RBC: 2.97 MIL/uL — ABNORMAL LOW (ref 3.87–5.11)
WBC: 10.2 10*3/uL (ref 4.0–10.5)

## 2010-07-10 ENCOUNTER — Ambulatory Visit: Admit: 2010-07-10 | Payer: Self-pay | Admitting: Family Medicine

## 2010-07-12 LAB — WET PREP, GENITAL
Trich, Wet Prep: NONE SEEN
Yeast Wet Prep HPF POC: NONE SEEN

## 2010-07-14 LAB — CBC
HCT: 27.7 % — ABNORMAL LOW (ref 36.0–46.0)
Hemoglobin: 8.7 g/dL — ABNORMAL LOW (ref 12.0–15.0)
MCH: 28.3 pg (ref 26.0–34.0)
MCHC: 31.4 g/dL (ref 30.0–36.0)
MCV: 90.2 fL (ref 78.0–100.0)
Platelets: 369 10*3/uL (ref 150–400)
RBC: 3.07 MIL/uL — ABNORMAL LOW (ref 3.87–5.11)
RDW: 14.8 % (ref 11.5–15.5)
WBC: 9.4 10*3/uL (ref 4.0–10.5)

## 2010-07-15 ENCOUNTER — Encounter (HOSPITAL_COMMUNITY): Payer: Self-pay | Admitting: Obstetrics and Gynecology

## 2010-07-15 LAB — RETICULOCYTES
RBC.: 2.64 MIL/uL — ABNORMAL LOW (ref 3.87–5.11)
Retic Count, Absolute: 95 10*3/uL (ref 19.0–186.0)
Retic Ct Pct: 3.6 % — ABNORMAL HIGH (ref 0.4–3.1)

## 2010-07-15 LAB — IRON AND TIBC
Iron: 121 ug/dL (ref 42–135)
Saturation Ratios: 25 % (ref 20–55)
TIBC: 475 ug/dL — ABNORMAL HIGH (ref 250–470)
UIBC: 354 ug/dL

## 2010-07-15 LAB — FERRITIN: Ferritin: 7 ng/mL — ABNORMAL LOW (ref 10–291)

## 2010-07-15 NOTE — Progress Notes (Signed)
Summary: Letter Needed  Phone Note Call from Patient Call back at Home Phone [phone]   Caller: Patient Summary of Call: Needs letter for medicaid today stating how far along she is and when she is due. Initial call taken by: Clydell Hakim,  February 24, 2010 1:48 PM  Follow-up for Phone Call        forwarded to pcp Follow-up by: Loralee Pacas CMA,  February 24, 2010 3:37 PM  Additional Follow-up for Phone Call Additional follow up Details #1::        LEtter done Additional Follow-up by: Sarah Swaziland MD,  February 24, 2010 10:43 PM

## 2010-07-15 NOTE — Progress Notes (Signed)
  Phone Note Outgoing Call   Call placed by: Lequita Asal  MD,  Oct 31, 2009 12:57 PM Summary of Call: patient doesnt have IUD any more. needs to continue depo or decide on alternative contraception. no gallstones or sludge. previous cyst has resolved. there is a small one on L, but likely doesnt explain pain.  Initial call taken by: Lequita Asal  MD,  Oct 31, 2009 12:57 PM  Follow-up for Phone Call        called and informed pt of results. also spoke to her about implanon Follow-up by: Loralee Pacas CMA,  Oct 31, 2009 5:06 PM

## 2010-07-15 NOTE — Letter (Signed)
Summary: Out of Work  [facility]  [address]   [place], [place] [address]   Phone: [phone]  Fax: [phone]    February 24, 2010   Employee:  AUDRIANNA DRISKILL    To Whom It May Concern:   For Medical reasons, please excuse the above named employee from work for the following dates:  Start:   02/24/10  End:   03/08/10  If you need additional information, please feel free to contact our office.         Sincerely,    Sarah Swaziland MD

## 2010-07-15 NOTE — Assessment & Plan Note (Signed)
 Summary: HA & spots before her eyes-ED last weekend   Vital Signs:  Patient Profile:   25 Years Old Female Height:     65 inches Weight:      234.7 pounds Temp:     98.0 degrees F oral Pulse rate:   127 / minute BP sitting:   122 / 73  (left arm)  Vitals Entered By: LETITIA REUSING (May 01, 2008 11:19 AM)                 PCP:  PRESTON BLOOD  MD  Chief Complaint:  h/a and blurred vision.  History of Present Illness: 57F at [redacted] weeks EGA has same visual disturbance as present at prior visit.  Also has HA similar to last visit.  Has tried OTC tylenol  to no avail.  Went to MAU 2 days ago with HA and visual disturbance and had negative 24h Urine protein, negative PIH labs as drawn previously here at [facility].  Feels baby moving well, no vaginal bleeding or discharge or fluid leakage, no contractions.    Current Allergies: No known allergies      Review of Systems  The patient denies vision loss, chest pain, syncope, peripheral edema, abdominal pain, difficulty walking, and abnormal bleeding.     Physical Exam  General:     Well-developed,well-nourished,in no acute distress; alert,appropriate and cooperative throughout examination Head:     Normocephalic and atraumatic without obvious abnormalities.  Eyes:     No corneal or conjunctival inflammation noted. EOMI. Perrla. Funduscopic exam benign, without hemorrhages, exudates or papilledema. Vision grossly normal. Lungs:     Normal respiratory effort, chest expands symmetrically. Lungs are clear to auscultation, no crackles or wheezes. Heart:     Normal rate and regular rhythm. S1 and S2 normal without gallop, murmur, click, rub or other extra sounds. Extremities:     No clubbing, cyanosis, or deformity noted.  Trace edema in lower ext. Neurologic:     Grossly Non-focal    Impression & Recommendations:  Problem # 1:  VISUAL SCOTOMATA (ICD-368.44) With normal BP, negative PIH labs, negative 24h urine protein, normal  fundoscopy, trace edema, this is unlikely pre-eclampsia and more likely normal symptoms during pregnancy.  Will likely persist until delivery.  Will recommend that she come back in one weel for routine 2 week visit. Orders: [facility]- Est Level  3 (00786)   Problem # 2:  HEADACHE (ICD-784.0) Nonfocal neuro and eye exam, no N/V.   Will stop Vicodin and try tylenol  650 for headache.  The following medications were removed from the medication list:    Hydrocodone -acetaminophen  5-325 Mg Tabs (Hydrocodone -acetaminophen ) ..... One tab by mouth q4-q6 as needed pain  Her updated medication list for this problem includes:    Tylenol  8 Hour 650 Mg Cr-tabs (Acetaminophen ) ..... One tab by mouth q8h as needed headache  Orders: [facility]- Est Level  3 (00786)   Problem # 3:  PREGNANCY, NORMAL (ICD-V22.2) No warning symptoms, bleeding, fluid leakage, feels baby move daily, no CTX.  Will proceed with routine prenatal care. Orders: [facility]- Est Level  3 (00786)   Complete Medication List: 1)  Omeprazole  20 Mg Tbec (Omeprazole ) .SABRA.. 1 tab by mouth daily for reflux 2)  Prenatal Vitamins 60-0.8 Mg Tabs (Prenatal multivit-min-fe-fa) .... Take one tab by mouth daily. may substitute for generic or patient choice of tablet. 3)  Promethazine  Hcl 25 Mg Supp (Promethazine  hcl) .... One per rectum q4-q6 hours as needed nausea. 4)  Zofran  Odt 4 Mg  Tbdp (Ondansetron ) .... One tab by mouth q6 hours as needed nausea 5)  Miralax  Powd (Polyethylene glycol 3350 ) .SABRA.. 17g mixed in 8 oz of fluid tid as needed constipation. dispense 10 packets 6)  Tylenol  8 Hour 650 Mg Cr-tabs (Acetaminophen ) .... One tab by mouth q8h as needed headache   Patient Instructions: 1)  Good to see you again today.  Your exam was normal today and it seems your visual changes are a normal symptom of this pregnancy.  You do not have pre-eclampsia at this time. 2)   We will try a stronger tylenol  for your headaches.  Come back in one week for your regular  34wk prenatal visit. 3)  -Dr. ONEIDA.   Prescriptions: TYLENOL  8 HOUR 650 MG CR-TABS (ACETAMINOPHEN ) One tab by mouth q8h as needed headache  #90 x 1   Entered by:   DEBBY PETTIES MD   Authorized by:   CAMIE MULCH MD   Signed by:   DEBBY PETTIES MD on 05/01/2008   Method used:   Electronically to        [facility] [phone]* (retail)       [address]       [place], [place]  [address]       Ph: [phone]       Fax: [phone]   RxID:   [id number]  ]

## 2010-07-15 NOTE — Letter (Signed)
Summary: Generic Letter  [facility]  [address]   [place], [place] [address]   Phone: [phone]  Fax: [phone]    02/24/2010  EDOM SCHMUHL [address] [place], [place]  [address]  To Whom It May Concern: Ms. Siebels is a patient at our practice.  She is currently 7 weeks and 6 days pregnant with a EDD of 10/06/09.  Please contact us if you have any questions.   Sincerely,   Sarah Swaziland MD

## 2010-07-15 NOTE — Progress Notes (Signed)
Summary: Ref Req  Phone Note Call from Patient Call back at Home Phone [phone]   Caller: Patient Summary of Call: Would like to go to the dental clinic for her wisdom teeth. Initial call taken by: Clydell Hakim,  December 17, 2009 8:33 AM  Follow-up for Phone Call        pt notified to call for apt to discuss with Provider, voiced understanding Follow-up by: Gladstone Pih,  December 17, 2009 9:15 AM

## 2010-07-15 NOTE — Progress Notes (Signed)
Summary: still with nausea and vomiting.   Phone Note Other Incoming Call back at [phone]   Summary of Call: [name] with [facility] went to patients home on Saturday and weighed her at 221.  Today came back to house and patient'sweight now is 213.2   Patient has been c/o of vomitting since Saturday night.  Has not eaten or drank anything since Sunday afternoon.  Have been cramping since Saturday.  Will have her place on MAU  to be eval.  Please contact [name] at number above for further instructions. Initial call taken by: Britta Mccreedy mcgregor  Follow-up for Phone Call        LM for rn Follow-up by: Golden Circle RN,  March 03, 2010 9:48 AM  Additional Follow-up for Phone Call Additional follow up Details #1::        Returned call. Additional Follow-up by: Clydell Hakim,  March 03, 2010 9:57 AM    Additional Follow-up for Phone Call Additional follow up Details #2::    sent to [facility] this am. she is unable to keep anything down. told rn I will let her md know Follow-up by: Golden Circle RN,  March 03, 2010 9:59 AM

## 2010-07-15 NOTE — Letter (Signed)
Summary: *Referral Letter  [facility]  [address]   [place], [place] [address]   Phone: [phone]  Fax: [phone]    02/26/2010  Thank you in advance for agreeing to see my patient:  [name] [address] [place], [place]  [address]  Phone: [phone]  Reason for Referral: Twin gestation, severe hyperemesis Ms Raczkowski is a 25 yo [id number] with a twin gestation at 21 and 1/7 by a 6 week sono, who also has severe nausea and vomiting and an 11 lb weight loss since the start of the pregnancy.  She has been seen in the MAU for IV fluids and meds, and her outpatient oral regimen is optimized.  Thank you for assuming care of this pt.  Records are attached, but please feel free to contact me if I can provide further information. Procedures Requested:   Current Medical Problems:   Current Medications:   Past Medical History: 1)  h/o multiple STIs in the past 2)  [id number] - delivery on 06/13/03, 06/01/08 (TABs x3) 3)  Hyperemesis and Preeclampsia with 1st completed pregnancy, Hyperemesis and PIH with 2nd completed pregnancy   Prior History of Blood Transfusions:   Pertinent Labs:    Thank you again for agreeing to see our patient; please contact us if you have any further questions or need additional information.  Sincerely,  Jahlen Bollman Swazil[place] MD

## 2010-07-15 NOTE — Letter (Signed)
Summary: Generic Letter  [facility]  [address]   [place], [place] [address]   Phone: [phone]  Fax: [phone]    10/08/2009  [name] [address] [place], [place]  [address]  Dear [name],  It has been my pleasure to take care of you, [name], and [name] over the last 3 years. I truly have enjoyed getting to know you and helping you with your healthcare needs. Over the next several weeks, you will be assigned a new provider at [facility]. He/She will have access to all of your records and continue to provide you with excellent care. If you have any questions, please feel free to contact our office.  Sincerely,   Lequita Asal  MD  Appended Document: Generic Letter mailed

## 2010-07-15 NOTE — Miscellaneous (Signed)
Summary: HR appt 9/22 at 8:45  Clinical Lists Changes Appt scheduled with High Risk for 03/06/10 at 8:45. Pt aware.  Sarah Swaziland MD  February 26, 2010 8:58 AM

## 2010-07-15 NOTE — Assessment & Plan Note (Signed)
Summary: NOB/TWINS/REFER TO HIGH RISK/DSL   Vital Signs:  Patient profile:   25 year old female LMP:     01/19/2010 Weight:      219.4 pounds Temp:     99.1 degrees F oral Pulse rate:   89 / minute Pulse rhythm:   regular BP sitting:   133 / 84  (right arm) Cuff size:   large  Vitals Entered By: Loralee Pacas CMA (February 24, 2010 10:07 AM) CC: NOB Is Patient Diabetic? No LMP (date): 01/19/2010 EDC by LMP==> 10/26/2010 EDC 10/07/2010 LMP - Character: normal LMP - Reliable? Yes Menarche (age onset years): 12   Menses interval (days): 28 Menstrual flow (days): 5 On BCP's at conception: no Enter LMP: 01/19/2010 Last PAP Result normal   CC:  NOB.  Habits & Providers  Alcohol-Tobacco-Diet     Alcohol drinks/day: 0     Alcohol type: beer     Tobacco Status: never     Tobacco Counseling: not indicated; no tobacco use     Cigarette Packs/Day: n/a     Passive Smoke Exposure: yes  Allergies: No Known Drug Allergies  Social History: Occupation:  drive Education:  1 year college  Physical Exam  General:  Obese,in no acute distress; alert,appropriate and cooperative throughout examination Eyes:  No conjunctival injection or discharge.  Vision grossly normal. Mouth:  Oral mucosa and oropharynx without lesions or exudates.  Teeth in good repair. MM slightly dry. Breasts:  No mass, nodules, thickening, tenderness, bulging, retraction, inflamation, nipple discharge or skin changes noted.   Lungs:  Normal respiratory effort, chest expands symmetrically. Lungs are clear to auscultation, no crackles or wheezes. Heart:  Normal rate and regular rhythm. S1 and S2 normal without gallop, murmur, click, rub or other extra sounds. Abdomen:  ,abdomen soft and non-tender without masses, organomegaly or hernias noted. Genitalia:  Pelvic Exam:        External: normal female genitalia without lesions or masses        Vagina: normal without lesions or masses        Cervix: normal  without lesions or masses        Adnexa: normal bimanual exam without masses or fullness        Uterus: Slightly enlarged, but exam limited due to body habitus.          Pap smear: performed Extremities:  N edema Psych:  Cognition and judgment appear intact. Alert and cooperative with normal attention span and concentration. No apparent delusions, illusions, hallucinations   Impression & Recommendations:  Problem # 1:  TWIN GESTATION (ICD-651.00) Pt noted to have mono-di twin gestation on ultrasound 02/11/10. Repeat ultrasound orders to assess viability given the low heart rates noted on that ultrasound.  Will refer to high risk for management of pregnancy. Left message for [facility].  Prenatal labs unremarkable.   Pt needs glucola, but unable to tolerate anything by mouth.  Will defer glucola for now.  May need to try jelly bean protocol if still with severe vomiting.   Declined genetic testing, Desires post partum tubal ligation.   Orders: Prenatal U/S < 14 weeks - [address]  (Prenatal U/S)  Problem # 2:  Hx of HYPEREMESIS, SEVERE (ICD-536.2) Pt maxed out on outpatient meds, and states she is taking them without relief.  MM slightly dry, but UA with normal spec grav and no ketones, so pt must be tolerating some by mouth intake.  However, with her last pregnancy, she required a Zofran pump to  control nausea, and pt with significant weight loss already in this pregnancy.  Will be followed by high risk. No emergent intervention required today, but will try to get to high risk by next week so weight and symptoms can be followed. Orders: Urinalysis-[facility] (00000)  Problem # 3:  Hx of PRE-ECLAMPSIA (ICD-642.40) Will need close follow up of BP during this pregnancy as she has a history of being induced with each term pregnancy for pre-x.    Other Orders: GC/Chlamydia-[facility] (87591/[id number]) Pap Smear-[facility] ([id number])  Prenatal Visit    FOB name: Jamar Capers Concerns noted: Vomiting all day.   Anything that she tries to eat or drink comes back up.  Went to MAU last week given Zofran, phenergan, reglan.   Taking all those meds, but they aren't staying down and don't help much.   EDC Confirmation:    New working [facility]: 10/07/2010    LMP reliable? Yes    Last menses onset (LMP) date: 01/19/2010    EDC by LMP: 10/26/2010 Ultrasound Dating Information:    First U/S on 02/11/2010   Gest age:  6 weeks 0 days   EDC: 10/07/2010.   Flowsheet View for Follow-up Visit    Estimated weeks of       gestation:     7 6/7    Weight:     219.4    Blood pressure:   133 / 84    Urine Protein:     trace    Urine Glucose:   negative    Urine Nitrite:     negative    Headache:     daily    Nausea/vomiting:   freq    Edema:     0    Vaginal bleeding:   no    Vaginal discharge:   no    Cx Dilation:     0    Cx Effacement:   0%    Cx Station:     high    Taking prenatal vits?   Y    Smoking:     n/a    Comment:     Needs glucola, but unable to tolerate until vomiting improves   Past Pregnancy History    Gravida:     6    Term Births:     2    Living Children:   2    Para:       2    Aborta:     3    Elect. 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 No DV.  Father of her son  Menstrual History    LMP (date): 01/19/2010    EDC by LMP: 10/26/2010    [facility] Working EDC: 10/07/2010    LMP - Character: normal    LMP - Reliable? : Yes    Menarche: 12 years    Menses interval: 28 days    Menstrual flow 5 days    On BCP's at conception: no    Pre Pregnancy Weight: 230 lbs.    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  Not close contact with them.  Laboratory Results   Urine Tests  Date/Time Received: February 24, 2010 11:59 AM  Date/Time Reported: February 24, 2010 12:21 PM   Routine Urinalysis   Color: yellow Appearance: Clear Glucose: negative   (Normal Range: Negative) Bilirubin: negative   (Normal Range: Negative) Ketone: negative   (Normal Range: Negative) Spec. Gravity: 1.020   (Normal Range: 1.003-1.035) Blood: trace-intact   (Normal Range: Negative) pH: 8.5   (Normal Range: 5.0-8.0) Protein: trace   (Normal Range: Negative) Urobilinogen: 1.0   (Normal Range: 0-1) Nitrite: negative   (Normal Range: Negative) Leukocyte Esterace: negative   (Normal Range: Negative)  Urine Microscopic WBC/HPF: rare RBC/HPF: 1-5 Bacteria/HPF: trace Epithelial/HPF: occ    Comments: ...............test performed by......Marland KitchenBonnie A. Swaziland, MLS (ASCP)cm      Appended Document: NOB/TWINS/REFER TO HIGH RISK/DSL PHQ 9: 6 (3 points for each anhedonia, decreased energy, pt reports due to nausea and vomiting) PMH Risk screening form completed and negative by pt.  Triggers for referral for twin gestation.   Appended Document: NOB/TWINS/REFER TO HIGH  RISK/DSL    Clinical Lists Changes  Orders: Added new Test order of Medicaid OB visit - [facility] [phone]) - Signed

## 2010-07-15 NOTE — Assessment & Plan Note (Signed)
Summary: f/up,tcb   Vital Signs:  Patient profile:   25 year old female Height:      65 inches Weight:      227.1 pounds BMI:     37.93 Pulse rate:   76 / minute BP sitting:   123 / 80  (right arm)  Vitals Entered By: Arlyss Repress CMA, (July 25, 2009 10:05 AM) CC: check IUD. can not feel strings. Is Patient Diabetic? No Pain Assessment Patient in pain? no        Primary Care Provider:  Lequita Asal  MD  CC:  check IUD. can not feel strings..  History of Present Illness: 25 y/o [id number] who presents for contraceptive management. 3rd IUD placed 04/2009. patient seen in ED in December for abdominal pain/bleeding. IUD visualized on ultrasound. some time thereafter patient reported being unable to feel IUD strings. LMP 06/21/09. unhappy with IUD and wants it removed 2/2 heavy bleeding. at same visit, patient noted to have 4 cm R ovarian cyst.   Habits & Providers  Alcohol-Tobacco-Diet     Tobacco Status: never  Current Medications (verified): 1)  None  Allergies (verified): No Known Drug Allergies  Past History:  Past Medical History: h/o multiple STIs in the past [id number] - delivery on 06/13/03, 06/01/08 (TABs x3) Hyperemesis and Preeclampsia with 1st completed pregnancy, Hyperemesis and PIH with 2nd completed pregnancy  Social History: [id number]. No smoking, no drug use. Social drinker.  Lives with daughter and son Colletta [place], [place]). currently involved with [employer]. Works at [employer].  Physical Exam  General:  obese female, NAD. vitals reviewed.  Genitalia:  Normal introitus for age, no external lesions, no vaginal discharge, mucosa pink and moist, no vaginal or cervical lesions, no vaginal atrophyunable to visualize IUD strings.    Impression & Recommendations:  Problem # 1:  CONTRACEPTIVE MANAGEMENT (ICD-V25.09) Assessment Unchanged u/s to confirm IUD placement. patient desires to have IUD removed. if IUD in uterus, will refer to gyn. if not,  then will refer for Implanon placement. given depo provera today.   Orders: U Preg-[facility] (81025) Ultrasound (Ultrasound) [facility]- Est Level  3 (95638)  Problem # 2:  OVARIAN CYST, RIGHT (ICD-620.2) Assessment: New U/S to eval R ovarian cyst per OB/GYN.   Orders: Ultrasound (Ultrasound) [facility]- Est Level  3 (75643)  Laboratory Results   Urine Tests  Date/Time Received: July 25, 2009 10:42 AM  Date/Time Reported: July 25, 2009 10:52 AM     Urine HCG: negative Comments: ...........test performed by...........Marland KitchenTerese Door, CMA     Appended Document: f/up,tcb     Allergies: No Known Drug Allergies   Other Orders: Depo-Provera 150mg  (P2951)    Medication Administration  Injection # 1:    Medication: Depo-Provera 150mg     Diagnosis: CONTRACEPTIVE MANAGEMENT (ICD-V25.09)    Route: IM    Site: R deltoid    Exp Date: 10/14/2011    Lot #: [id number]    Mfr: greenstone    Comments: pt to rtc 10/10/2009-10/24/2009    Patient tolerated injection without complications    Given by: Loralee Pacas CMA (July 26, 2009 5:06 PM)  Orders Added: 1)  Depo-Provera 150mg  [[id number]]

## 2010-07-15 NOTE — Assessment & Plan Note (Signed)
Summary: fu gall bladder/kh   Vital Signs:  Patient profile:   25 year old female Weight:      220 pounds Temp:     98.1 degrees F oral Pulse rate:   74 / minute Pulse rhythm:   regular BP sitting:   118 / 76 Cuff size:   large  Vitals Entered By: Loralee Pacas CMA (Oct 25, 2009 4:17 PM) CC: f/u abdominal pain Is Patient Diabetic? No   Primary Care Provider:  Lequita Asal  MD  CC:  f/u abdominal pain.  History of Present Illness: abdominal pain- chronic issue. seen in MAU recently, here for f/u. h/o gallbladder sludge. states this feels similar. unable to tolerate by mouth x 3 days. +N/V. R sided pain. h/o R ovarian cyst also.     Current Medications (verified): 1)  None  Allergies (verified): No Known Drug Allergies  Physical Exam  General:  obese female, NAD. vitals reviewed.  Lungs:  Normal respiratory effort, chest expands symmetrically. Lungs are clear to auscultation, no crackles or wheezes. Heart:  Normal rate and regular rhythm. S1 and S2 normal without gallop, murmur, click, rub or other extra sounds. Abdomen:  obese, mild RUQ and suprapubic TTP. no rebound or guarding. +BS   Impression & Recommendations:  Problem # 1:  ABDOMINAL PAIN, CHRONIC (ICD-789.00) Assessment Deteriorated  schedule for u/s. check labs. f/u in 2 weeks.   Orders: Comp Met-[facility] [phone]) Lipase-[facility] [phone]) CBC-[facility] (30865) Ultrasound (Ultrasound) [facility]- Est Level  3 (78469)  Problem # 2:  OVARIAN CYST, RIGHT (ICD-620.2) Assessment: Unchanged u/s

## 2010-07-15 NOTE — Letter (Signed)
Summary: Out of Work  [facility]  [address]   [place], [place] [address]   Phone: [phone]  Fax: [phone]    February 24, 2010   Employee:  LAWREN SEXSON    To Whom It May Concern:   For Medical reasons, please excuse the above named employee from work for the following dates:  Start:    End:    If you need additional information, please feel free to contact our office.         Sincerely,    Sarah Swazil[place] MD

## 2010-07-15 NOTE — Letter (Signed)
Summary: *Referral Letter  [facility]  [address]   [place], [place] [address]   Phone: [phone]  Fax: [phone]    02/26/2010  Thank you in advance for agreeing to see my patient:  [name] [address] [place], [place]  [address]  Phone: [phone]  Reason for Referral:   Procedures Requested:   Current Medical Problems: 1)  SCREENING FOR MALIGNANT NEOPLASM OF THE CERVIX (ICD-V76.2) 2)  SUPERVISION OF OTHER NORMAL PREGNANCY (ICD-V22.1) 3)  TWIN GESTATION (ICD-651.00) 4)  OVARIAN CYST, RIGHT (ICD-620.2) 5)  CONTRACEPTIVE MANAGEMENT (ICD-V25.09) 6)  Hx of HYPEREMESIS, SEVERE (ICD-536.2) 7)  Hx of PRE-ECLAMPSIA (ICD-642.40) 8)  Hx of PREGNANCY-INDUCED HYPERTENSION (ICD-642.90) 9)  ABDOMINAL PAIN, CHRONIC (ICD-789.00) 10)  OBESITY, NOS (ICD-278.00)   Current Medications:   Past Medical History: 1)  h/o multiple STIs in the past 2)  [id number] - delivery on 06/13/03, 06/01/08 (TABs x3) 3)  Hyperemesis and Preeclampsia with 1st completed pregnancy, Hyperemesis and PIH with 2nd completed pregnancy   Prior History of Blood Transfusions:   Pertinent Labs:    Thank you again for agreeing to see our patient; please contact us if you have any further questions or need additional information.  Sincerely,  Aleiya Rye [place] MD

## 2010-07-16 LAB — URINE CULTURE: Culture  Setup Time: 201201310321

## 2010-07-17 ENCOUNTER — Encounter: Payer: Self-pay | Admitting: Obstetrics & Gynecology

## 2010-07-17 ENCOUNTER — Other Ambulatory Visit: Payer: Self-pay

## 2010-07-17 DIAGNOSIS — O211 Hyperemesis gravidarum with metabolic disturbance: Secondary | ICD-10-CM

## 2010-07-17 DIAGNOSIS — O169 Unspecified maternal hypertension, unspecified trimester: Secondary | ICD-10-CM

## 2010-07-17 DIAGNOSIS — O26879 Cervical shortening, unspecified trimester: Secondary | ICD-10-CM

## 2010-07-17 DIAGNOSIS — O30009 Twin pregnancy, unspecified number of placenta and unspecified number of amniotic sacs, unspecified trimester: Secondary | ICD-10-CM

## 2010-07-17 LAB — CONVERTED CEMR LAB
HCT: 26.3 % — ABNORMAL LOW (ref 36.0–46.0)
Hemoglobin: 8.2 g/dL — ABNORMAL LOW (ref 12.0–15.0)
MCV: 91 fL (ref 78.0–100.0)
Platelets: 336 10*3/uL (ref 150–400)
WBC: 8.2 10*3/uL (ref 4.0–10.5)

## 2010-07-17 LAB — POCT URINALYSIS DIPSTICK
Protein, ur: 30 mg/dL — AB
Specific Gravity, Urine: >=1.03 (ref 1.005–1.030)
pH: 6 (ref 5.0–8.0)

## 2010-07-21 ENCOUNTER — Ambulatory Visit (HOSPITAL_COMMUNITY)
Admission: RE | Admit: 2010-07-21 | Discharge: 2010-07-21 | Disposition: A | Discharge status: Home / Self Care | Payer: Medicaid Other | Source: Ambulatory Visit | Attending: Obstetrics and Gynecology | Admitting: Obstetrics and Gynecology

## 2010-07-21 ENCOUNTER — Other Ambulatory Visit (HOSPITAL_COMMUNITY): Payer: Self-pay | Admitting: Maternal and Fetal Medicine

## 2010-07-21 ENCOUNTER — Other Ambulatory Visit (HOSPITAL_COMMUNITY): Payer: Self-pay | Admitting: Obstetrics and Gynecology

## 2010-07-21 DIAGNOSIS — IMO0001 Reserved for inherently not codable concepts without codable children: Secondary | ICD-10-CM

## 2010-07-21 DIAGNOSIS — O30009 Twin pregnancy, unspecified number of placenta and unspecified number of amniotic sacs, unspecified trimester: Secondary | ICD-10-CM

## 2010-07-21 DIAGNOSIS — O26879 Cervical shortening, unspecified trimester: Secondary | ICD-10-CM | POA: Insufficient documentation

## 2010-07-21 NOTE — Discharge Summary (Addendum)
NAMEKIMBERLLY, [name]               ACCOUNT NO.:  [id number]  MEDICAL RECORD NO.:  [id number]          PATIENT TYPE:  INP  LOCATION:  9155                          [facility]ILITY:  [facility]  PHYSICIAN:  Tanya S. Shawnie Pons, M.D.   DATE OF BIRTH:  December 23, 1985  DATE OF ADMISSION:  07/06/2010 DATE OF DISCHARGE:  07/15/2010                              DISCHARGE SUMMARY   ADMISSION DIAGNOSES: 1. Intrauterine pregnancy at 26 weeks and 5 days. 2. Cervical shortening/cervical incompetency.  ADDITIONAL ADMISSION DIAGNOSES: 1. Chronic hypertension. 2. Threatened preterm labor.  DISCHARGE DIAGNOSES: 1. Intrauterine pregnancy at 26 weeks and 5 days. 2. Cervical shortening/cervical incompetency. 3. Bacterial vaginosis treated with Flagyl. 4. Threatened preterm labor status post betamethasone. 5. A viable twin. 6. Intrauterine pregnancy at 28 weeks, the patient's cervix remained,     2 cm dilation.  FELLOW:  [name], MDDISCHARGE MEDICATIONS: 1. Labetalol 100 mg 1 tab p.o. b.i.d. for hypertension. 2. Flagyl 500 mg 1 tab p.o. b.i.d. x4 days to complete the course of 7-     day course for bacterial vaginosis. 3. Continue Prometrium 200 mg 1 tab p.o. per vagina nightly for     cervical shortening/incompetency. 4. The patient can continue otherwise her other home medications.  PERTINENT LABORATORY DATA:  On admission, the patient had a UA that was negative.  She had a fetal fibronectin which was positive.  CBC on admission was notable for white count of 10.2, hemoglobin was 8.5, platelets were 358.  Complete metabolic panel was normal except for glucose of 131.  The patient also had a 24-hour urine that was collected and a protein of 126 which was slightly elevated from previous 117.  She also had a wet prep during this hospitalization which was notable for a few clue cells which was treated with Flagyl and most recent CBC with a hemoglobin of 8.7 as of yesterday, July 14, 2010.  HOSPITAL  COURSE:  This was a 25 year old gravida 6, para 1-1-3-2, who presented at 26 weeks and 5 days with mono-di twins with cervical dilation as well as cervical incompetency.  The patient was not compliant with her bedrest and so the patient was admitted for continued observation and strict bedrest.  The patient was observed in that regard.  She did collect a 24-hour urine which is resulted as above. Her hospital course was essentially stable.  She was otherwise chronically stable.  Repeat cervical length was 1.4 cm which is essentially stable from the 19th as well as from the 9th which states that her cervix is 1.2-1.4 cm at that time and so, spoke with the patient, she is otherwise doing well and discussed with patient discharge home on close clinic followup, precautions, and strict bedrest and patient is agreeable to discharge.  DISPOSITION:  Discharge home.  DISCHARGE CONDITION:  Stable.  FOLLOWUP:  The patient is to follow up in the [facility] on Thursday, July 17, 2010, appointment is pending at this time. She also has a maternal fetal medicine appointment on Monday for which she is to keep.  ER WARNINGS:  The patient to return if she has any  fever, chills, nausea, vomiting, contractions, bleeding, spotting, rupture of membranes, or any other concerning symptoms or if she is not compliant with her medications or any type of abdominal or lower abdominal pressure.    ______________________________ [name], MD   ______________________________ Shelbie Proctor. Shawnie Pons, M.D.    LC/MEDQ  D:  07/15/2010  T:  07/16/2010  Job:  [id number]  Electronically Signed by Tinnie Gens M.D. on 07/21/2010 08:54:54 AM Electronically Signed by [name] MD on 07/29/2010 09:21:59 AM

## 2010-07-22 ENCOUNTER — Other Ambulatory Visit: Payer: Medicaid Other

## 2010-07-22 ENCOUNTER — Encounter: Payer: Self-pay | Admitting: Obstetrics & Gynecology

## 2010-07-22 DIAGNOSIS — Z0189 Encounter for other specified special examinations: Secondary | ICD-10-CM

## 2010-07-24 ENCOUNTER — Other Ambulatory Visit: Payer: Self-pay

## 2010-07-24 DIAGNOSIS — O30009 Twin pregnancy, unspecified number of placenta and unspecified number of amniotic sacs, unspecified trimester: Secondary | ICD-10-CM

## 2010-07-26 ENCOUNTER — Other Ambulatory Visit: Payer: Self-pay | Admitting: Obstetrics & Gynecology

## 2010-07-26 ENCOUNTER — Inpatient Hospital Stay (HOSPITAL_COMMUNITY)
Admission: AD | Admit: 2010-07-26 | Discharge: 2010-07-26 | Disposition: A | Discharge status: Home / Self Care | Payer: Medicaid Other | Source: Ambulatory Visit | Attending: Obstetrics & Gynecology | Admitting: Obstetrics & Gynecology

## 2010-07-26 DIAGNOSIS — R109 Unspecified abdominal pain: Secondary | ICD-10-CM

## 2010-07-26 DIAGNOSIS — O9989 Other specified diseases and conditions complicating pregnancy, childbirth and the puerperium: Secondary | ICD-10-CM

## 2010-07-26 DIAGNOSIS — O99891 Other specified diseases and conditions complicating pregnancy: Secondary | ICD-10-CM | POA: Insufficient documentation

## 2010-07-28 LAB — POCT URINALYSIS DIPSTICK
Nitrite: NEGATIVE
Protein, ur: 30 mg/dL — AB
Specific Gravity, Urine: >=1.03 (ref 1.005–1.030)
Specific Gravity, Urine: >=1.03 (ref 1.005–1.030)
Urine Glucose, Fasting: 100 mg/dL — AB
pH: 6 (ref 5.0–8.0)
pH: 5.5 (ref 5.0–8.0)

## 2010-07-31 ENCOUNTER — Inpatient Hospital Stay (HOSPITAL_COMMUNITY)
Admission: AD | Admit: 2010-07-31 | Discharge: 2010-07-31 | Disposition: A | Discharge status: Home / Self Care | Payer: Medicaid Other | Source: Ambulatory Visit | Attending: Obstetrics and Gynecology | Admitting: Obstetrics and Gynecology

## 2010-07-31 ENCOUNTER — Inpatient Hospital Stay (HOSPITAL_COMMUNITY): Payer: Medicaid Other

## 2010-07-31 DIAGNOSIS — O139 Gestational [pregnancy-induced] hypertension without significant proteinuria, unspecified trimester: Secondary | ICD-10-CM

## 2010-07-31 DIAGNOSIS — R52 Pain, unspecified: Secondary | ICD-10-CM

## 2010-07-31 LAB — COMPREHENSIVE METABOLIC PANEL
AST: 16 U/L (ref 0–37)
CO2: 22 mEq/L (ref 19–32)
Calcium: 9.8 mg/dL (ref 8.4–10.5)
Creatinine, Ser: 0.56 mg/dL (ref 0.4–1.2)
GFR calc Af Amer: >60 mL/min (ref >60)
GFR calc non Af Amer: >60 mL/min (ref >60)
Glucose, Bld: 68 mg/dL — ABNORMAL LOW (ref 70–99)
Total Protein: 7 g/dL (ref 6.0–8.3)

## 2010-07-31 LAB — URINALYSIS, ROUTINE W REFLEX MICROSCOPIC
Specific Gravity, Urine: 1.025 (ref 1.005–1.030)
Urine Glucose, Fasting: NEGATIVE mg/dL
Urobilinogen, UA: 0.2 mg/dL (ref 0.0–1.0)
pH: 8 (ref 5.0–8.0)

## 2010-07-31 LAB — PROTEIN / CREATININE RATIO, URINE
Creatinine, Urine: 158.7 mg/dL
Protein Creatinine Ratio: 0.09 (ref 0.00–0.15)

## 2010-07-31 LAB — WET PREP, GENITAL
Clue Cells Wet Prep HPF POC: NONE SEEN
Yeast Wet Prep HPF POC: NONE SEEN

## 2010-07-31 LAB — CBC
Hemoglobin: 8.6 g/dL — ABNORMAL LOW (ref 12.0–15.0)
MCH: 27.1 pg (ref 26.0–34.0)
MCHC: 30.6 g/dL (ref 30.0–36.0)
RDW: 15 % (ref 11.5–15.5)

## 2010-07-31 LAB — URINE MICROSCOPIC-ADD ON

## 2010-08-01 ENCOUNTER — Ambulatory Visit (HOSPITAL_COMMUNITY)
Admission: AD | Admit: 2010-08-01 | Discharge: 2010-08-01 | Disposition: A | Discharge status: Home / Self Care | Payer: Medicaid Other | Source: Ambulatory Visit | Attending: Obstetrics & Gynecology | Admitting: Obstetrics & Gynecology

## 2010-08-02 LAB — URINE CULTURE: Colony Count: 45000

## 2010-08-03 ENCOUNTER — Inpatient Hospital Stay (HOSPITAL_COMMUNITY)
Admission: AD | Admit: 2010-08-03 | Discharge: 2010-08-03 | Disposition: A | Discharge status: Home / Self Care | Payer: Medicaid Other | Source: Ambulatory Visit | Attending: Obstetrics & Gynecology | Admitting: Obstetrics & Gynecology

## 2010-08-03 DIAGNOSIS — O4700 False labor before 37 completed weeks of gestation, unspecified trimester: Secondary | ICD-10-CM | POA: Insufficient documentation

## 2010-08-03 LAB — URINALYSIS, ROUTINE W REFLEX MICROSCOPIC
Bilirubin Urine: NEGATIVE
Specific Gravity, Urine: 1.015 (ref 1.005–1.030)
Urine Glucose, Fasting: NEGATIVE mg/dL
pH: 6.5 (ref 5.0–8.0)

## 2010-08-03 LAB — AMNISURE RUPTURE OF MEMBRANE (ROM) NOT AT ARMC: Amnisure ROM: NEGATIVE

## 2010-08-03 LAB — URINE MICROSCOPIC-ADD ON

## 2010-08-04 ENCOUNTER — Ambulatory Visit (HOSPITAL_COMMUNITY)
Admission: RE | Admit: 2010-08-04 | Discharge: 2010-08-04 | Disposition: A | Discharge status: Home / Self Care | Payer: Medicaid Other | Source: Ambulatory Visit | Attending: Maternal and Fetal Medicine | Admitting: Maternal and Fetal Medicine

## 2010-08-04 ENCOUNTER — Other Ambulatory Visit: Payer: Self-pay | Admitting: Obstetrics & Gynecology

## 2010-08-04 DIAGNOSIS — O09299 Supervision of pregnancy with other poor reproductive or obstetric history, unspecified trimester: Secondary | ICD-10-CM | POA: Insufficient documentation

## 2010-08-04 DIAGNOSIS — IMO0001 Reserved for inherently not codable concepts without codable children: Secondary | ICD-10-CM

## 2010-08-04 DIAGNOSIS — O26879 Cervical shortening, unspecified trimester: Secondary | ICD-10-CM | POA: Insufficient documentation

## 2010-08-04 DIAGNOSIS — O30009 Twin pregnancy, unspecified number of placenta and unspecified number of amniotic sacs, unspecified trimester: Secondary | ICD-10-CM | POA: Insufficient documentation

## 2010-08-04 DIAGNOSIS — Z8751 Personal history of pre-term labor: Secondary | ICD-10-CM | POA: Insufficient documentation

## 2010-08-04 DIAGNOSIS — O3440 Maternal care for other abnormalities of cervix, unspecified trimester: Secondary | ICD-10-CM | POA: Insufficient documentation

## 2010-08-06 LAB — HERPES SIMPLEX VIRUS CULTURE

## 2010-08-06 LAB — CREATININE CLEARANCE, URINE, 24 HOUR
Collection Interval-CRCL: 24 hours
Urine Total Volume-CRCL: 1300 mL

## 2010-08-07 ENCOUNTER — Other Ambulatory Visit: Payer: Self-pay

## 2010-08-07 DIAGNOSIS — O169 Unspecified maternal hypertension, unspecified trimester: Secondary | ICD-10-CM

## 2010-08-07 DIAGNOSIS — O211 Hyperemesis gravidarum with metabolic disturbance: Secondary | ICD-10-CM

## 2010-08-07 DIAGNOSIS — O30009 Twin pregnancy, unspecified number of placenta and unspecified number of amniotic sacs, unspecified trimester: Secondary | ICD-10-CM

## 2010-08-07 DIAGNOSIS — O26879 Cervical shortening, unspecified trimester: Secondary | ICD-10-CM

## 2010-08-07 LAB — POCT URINALYSIS DIPSTICK
Nitrite: NEGATIVE
pH: 6 (ref 5.0–8.0)

## 2010-08-08 ENCOUNTER — Encounter (INDEPENDENT_AMBULATORY_CARE_PROVIDER_SITE_OTHER): Payer: Self-pay | Admitting: *Deleted

## 2010-08-10 ENCOUNTER — Observation Stay (HOSPITAL_COMMUNITY)
Admission: AD | Admit: 2010-08-10 | Discharge: 2010-08-12 | Disposition: A | Discharge status: Home / Self Care | Payer: Medicaid Other | Source: Ambulatory Visit | Attending: Obstetrics and Gynecology | Admitting: Obstetrics and Gynecology

## 2010-08-10 DIAGNOSIS — O30039 Twin pregnancy, monochorionic/diamniotic, unspecified trimester: Secondary | ICD-10-CM | POA: Insufficient documentation

## 2010-08-10 DIAGNOSIS — O30009 Twin pregnancy, unspecified number of placenta and unspecified number of amniotic sacs, unspecified trimester: Secondary | ICD-10-CM | POA: Insufficient documentation

## 2010-08-10 DIAGNOSIS — O139 Gestational [pregnancy-induced] hypertension without significant proteinuria, unspecified trimester: Secondary | ICD-10-CM

## 2010-08-10 DIAGNOSIS — A6000 Herpesviral infection of urogenital system, unspecified: Secondary | ICD-10-CM

## 2010-08-10 DIAGNOSIS — O98519 Other viral diseases complicating pregnancy, unspecified trimester: Secondary | ICD-10-CM | POA: Insufficient documentation

## 2010-08-10 DIAGNOSIS — O4700 False labor before 37 completed weeks of gestation, unspecified trimester: Principal | ICD-10-CM | POA: Insufficient documentation

## 2010-08-10 LAB — URINALYSIS, ROUTINE W REFLEX MICROSCOPIC
Nitrite: NEGATIVE
Specific Gravity, Urine: <1.005 — ABNORMAL LOW (ref 1.005–1.030)
pH: 6 (ref 5.0–8.0)

## 2010-08-10 LAB — CBC
Hemoglobin: 8.6 g/dL — ABNORMAL LOW (ref 12.0–15.0)
MCH: 26.4 pg (ref 26.0–34.0)
MCHC: 30.1 g/dL (ref 30.0–36.0)

## 2010-08-10 LAB — URINE MICROSCOPIC-ADD ON

## 2010-08-10 LAB — COMPREHENSIVE METABOLIC PANEL
AST: 48 U/L — ABNORMAL HIGH (ref 0–37)
CO2: 23 mEq/L (ref 19–32)
Calcium: 9.3 mg/dL (ref 8.4–10.5)
Creatinine, Ser: 0.57 mg/dL (ref 0.4–1.2)
GFR calc Af Amer: >60 mL/min (ref >60)
GFR calc non Af Amer: >60 mL/min (ref >60)
Glucose, Bld: 73 mg/dL (ref 70–99)

## 2010-08-11 DIAGNOSIS — O98519 Other viral diseases complicating pregnancy, unspecified trimester: Secondary | ICD-10-CM

## 2010-08-11 DIAGNOSIS — A6000 Herpesviral infection of urogenital system, unspecified: Secondary | ICD-10-CM

## 2010-08-11 DIAGNOSIS — O4700 False labor before 37 completed weeks of gestation, unspecified trimester: Secondary | ICD-10-CM

## 2010-08-11 DIAGNOSIS — O30009 Twin pregnancy, unspecified number of placenta and unspecified number of amniotic sacs, unspecified trimester: Secondary | ICD-10-CM

## 2010-08-11 LAB — CBC
MCH: 26.1 pg (ref 26.0–34.0)
MCHC: 29.8 g/dL — ABNORMAL LOW (ref 30.0–36.0)
Platelets: 288 10*3/uL (ref 150–400)

## 2010-08-11 LAB — COMPREHENSIVE METABOLIC PANEL
AST: 44 U/L — ABNORMAL HIGH (ref 0–37)
Albumin: 2.3 g/dL — ABNORMAL LOW (ref 3.5–5.2)
Calcium: 7.6 mg/dL — ABNORMAL LOW (ref 8.4–10.5)
Creatinine, Ser: 0.55 mg/dL (ref 0.4–1.2)
GFR calc Af Amer: >60 mL/min (ref >60)
GFR calc non Af Amer: >60 mL/min (ref >60)

## 2010-08-11 LAB — CREATININE CLEARANCE, URINE, 24 HOUR
Collection Interval-CRCL: 24 hours
Creatinine, Urine: 55.1 mg/dL
Creatinine: 0.55 mg/dL (ref 0.4–1.2)

## 2010-08-12 DIAGNOSIS — O4700 False labor before 37 completed weeks of gestation, unspecified trimester: Secondary | ICD-10-CM

## 2010-08-12 DIAGNOSIS — A6000 Herpesviral infection of urogenital system, unspecified: Secondary | ICD-10-CM

## 2010-08-12 DIAGNOSIS — O98519 Other viral diseases complicating pregnancy, unspecified trimester: Secondary | ICD-10-CM

## 2010-08-12 DIAGNOSIS — O30009 Twin pregnancy, unspecified number of placenta and unspecified number of amniotic sacs, unspecified trimester: Secondary | ICD-10-CM

## 2010-08-12 LAB — PROTEIN, URINE, 24 HOUR: Urine Total Volume-UPROT: 2400 mL

## 2010-08-14 ENCOUNTER — Other Ambulatory Visit: Payer: Self-pay

## 2010-08-14 ENCOUNTER — Inpatient Hospital Stay (HOSPITAL_COMMUNITY)
Admission: AD | Admit: 2010-08-14 | Discharge: 2010-08-16 | DRG: 778 | Disposition: A | Discharge status: Home / Self Care | Payer: Medicaid Other | Source: Ambulatory Visit | Attending: Obstetrics & Gynecology | Admitting: Obstetrics & Gynecology

## 2010-08-14 DIAGNOSIS — R030 Elevated blood-pressure reading, without diagnosis of hypertension: Secondary | ICD-10-CM | POA: Diagnosis present

## 2010-08-14 DIAGNOSIS — O30009 Twin pregnancy, unspecified number of placenta and unspecified number of amniotic sacs, unspecified trimester: Secondary | ICD-10-CM | POA: Diagnosis present

## 2010-08-14 DIAGNOSIS — O9989 Other specified diseases and conditions complicating pregnancy, childbirth and the puerperium: Secondary | ICD-10-CM

## 2010-08-14 DIAGNOSIS — O99891 Other specified diseases and conditions complicating pregnancy: Secondary | ICD-10-CM | POA: Diagnosis present

## 2010-08-14 DIAGNOSIS — O4700 False labor before 37 completed weeks of gestation, unspecified trimester: Principal | ICD-10-CM | POA: Diagnosis present

## 2010-08-14 LAB — CBC
MCH: 25.8 pg — ABNORMAL LOW (ref 26.0–34.0)
MCV: 86.8 fL (ref 78.0–100.0)
Platelets: 339 10*3/uL (ref 150–400)
RBC: 3.26 MIL/uL — ABNORMAL LOW (ref 3.87–5.11)
RDW: 15.9 % — ABNORMAL HIGH (ref 11.5–15.5)

## 2010-08-14 LAB — POCT URINALYSIS DIPSTICK
Ketones, ur: NEGATIVE mg/dL
Specific Gravity, Urine: 1.02 (ref 1.005–1.030)
Urine Glucose, Fasting: NEGATIVE mg/dL
Urobilinogen, UA: 0.2 mg/dL (ref 0.0–1.0)

## 2010-08-14 LAB — URINALYSIS, ROUTINE W REFLEX MICROSCOPIC
Bilirubin Urine: NEGATIVE
Ketones, ur: 15 mg/dL — AB
Leukocytes, UA: NEGATIVE
Nitrite: NEGATIVE
Protein, ur: NEGATIVE mg/dL
Urobilinogen, UA: 1 mg/dL (ref 0.0–1.0)
pH: 7.5 (ref 5.0–8.0)

## 2010-08-14 LAB — TYPE AND SCREEN
ABO/RH(D): A POS
Antibody Screen: NEGATIVE

## 2010-08-14 LAB — MAGNESIUM: Magnesium: 5.3 mg/dL — ABNORMAL HIGH (ref 1.5–2.5)

## 2010-08-15 LAB — MAGNESIUM: Magnesium: 7 mg/dL (ref 1.5–2.5)

## 2010-08-16 DIAGNOSIS — R030 Elevated blood-pressure reading, without diagnosis of hypertension: Secondary | ICD-10-CM

## 2010-08-16 DIAGNOSIS — O4700 False labor before 37 completed weeks of gestation, unspecified trimester: Secondary | ICD-10-CM

## 2010-08-16 DIAGNOSIS — O9989 Other specified diseases and conditions complicating pregnancy, childbirth and the puerperium: Secondary | ICD-10-CM

## 2010-08-16 DIAGNOSIS — O30009 Twin pregnancy, unspecified number of placenta and unspecified number of amniotic sacs, unspecified trimester: Secondary | ICD-10-CM

## 2010-08-16 LAB — COMPREHENSIVE METABOLIC PANEL
ALT: 54 U/L — ABNORMAL HIGH (ref 0–35)
AST: 61 U/L — ABNORMAL HIGH (ref 0–37)
CO2: 22 mEq/L (ref 19–32)
Chloride: 104 mEq/L (ref 96–112)
Creatinine, Ser: 0.58 mg/dL (ref 0.4–1.2)
GFR calc Af Amer: >60 mL/min (ref >60)
GFR calc non Af Amer: >60 mL/min (ref >60)
Glucose, Bld: 76 mg/dL (ref 70–99)
Sodium: 135 mEq/L (ref 135–145)
Total Bilirubin: 1 mg/dL (ref 0.3–1.2)

## 2010-08-16 LAB — URINALYSIS, MICROSCOPIC ONLY
Nitrite: NEGATIVE
Protein, ur: NEGATIVE mg/dL
Specific Gravity, Urine: 1.025 (ref 1.005–1.030)
Urobilinogen, UA: 0.2 mg/dL (ref 0.0–1.0)

## 2010-08-16 LAB — CBC
HCT: 25.9 % — ABNORMAL LOW (ref 36.0–46.0)
MCHC: 29.3 g/dL — ABNORMAL LOW (ref 30.0–36.0)
Platelets: 298 10*3/uL (ref 150–400)
RDW: 16.2 % — ABNORMAL HIGH (ref 11.5–15.5)
WBC: 5.6 10*3/uL (ref 4.0–10.5)

## 2010-08-16 LAB — PROTEIN / CREATININE RATIO, URINE: Creatinine, Urine: 118.9 mg/dL

## 2010-08-17 ENCOUNTER — Inpatient Hospital Stay (HOSPITAL_COMMUNITY)
Admission: AD | Admit: 2010-08-17 | Discharge: 2010-08-19 | DRG: 778 | Disposition: A | Discharge status: Home / Self Care | Payer: Medicaid Other | Source: Ambulatory Visit | Attending: Obstetrics and Gynecology | Admitting: Obstetrics and Gynecology

## 2010-08-17 DIAGNOSIS — O4700 False labor before 37 completed weeks of gestation, unspecified trimester: Principal | ICD-10-CM | POA: Diagnosis present

## 2010-08-17 DIAGNOSIS — O139 Gestational [pregnancy-induced] hypertension without significant proteinuria, unspecified trimester: Secondary | ICD-10-CM | POA: Diagnosis present

## 2010-08-17 DIAGNOSIS — O30009 Twin pregnancy, unspecified number of placenta and unspecified number of amniotic sacs, unspecified trimester: Secondary | ICD-10-CM | POA: Diagnosis present

## 2010-08-17 DIAGNOSIS — R1013 Epigastric pain: Secondary | ICD-10-CM | POA: Diagnosis present

## 2010-08-17 LAB — COMPREHENSIVE METABOLIC PANEL
ALT: 54 U/L — ABNORMAL HIGH (ref 0–35)
AST: 62 U/L — ABNORMAL HIGH (ref 0–37)
Albumin: 2.4 g/dL — ABNORMAL LOW (ref 3.5–5.2)
Alkaline Phosphatase: 130 U/L — ABNORMAL HIGH (ref 39–117)
BUN: 2 mg/dL — ABNORMAL LOW (ref 6–23)
CO2: 22 mEq/L (ref 19–32)
Calcium: 8.9 mg/dL (ref 8.4–10.5)
Chloride: 103 mEq/L (ref 96–112)
Creatinine, Ser: 0.62 mg/dL (ref 0.4–1.2)
GFR calc Af Amer: >60 mL/min (ref >60)
GFR calc non Af Amer: >60 mL/min (ref >60)
Glucose, Bld: 93 mg/dL (ref 70–99)
Potassium: 3.8 mEq/L (ref 3.5–5.1)
Sodium: 135 mEq/L (ref 135–145)
Total Bilirubin: 1 mg/dL (ref 0.3–1.2)
Total Protein: 5.9 g/dL — ABNORMAL LOW (ref 6.0–8.3)

## 2010-08-17 LAB — CBC
HCT: 27.7 % — ABNORMAL LOW (ref 36.0–46.0)
Hemoglobin: 8.3 g/dL — ABNORMAL LOW (ref 12.0–15.0)
MCH: 25.7 pg — ABNORMAL LOW (ref 26.0–34.0)
MCHC: 30 g/dL (ref 30.0–36.0)
MCV: 85.8 fL (ref 78.0–100.0)
Platelets: 324 10*3/uL (ref 150–400)
RBC: 3.23 MIL/uL — ABNORMAL LOW (ref 3.87–5.11)
RDW: 16.2 % — ABNORMAL HIGH (ref 11.5–15.5)
WBC: 7.1 10*3/uL (ref 4.0–10.5)

## 2010-08-17 LAB — URINALYSIS, ROUTINE W REFLEX MICROSCOPIC
Glucose, UA: 100 mg/dL — AB
Ketones, ur: NEGATIVE mg/dL
pH: 6.5 (ref 5.0–8.0)

## 2010-08-17 LAB — URINE MICROSCOPIC-ADD ON

## 2010-08-18 ENCOUNTER — Inpatient Hospital Stay (HOSPITAL_COMMUNITY): Payer: Medicaid Other

## 2010-08-18 LAB — COMPREHENSIVE METABOLIC PANEL
ALT: 43 U/L — ABNORMAL HIGH (ref 0–35)
Alkaline Phosphatase: 115 U/L (ref 39–117)
CO2: 24 mEq/L (ref 19–32)
Calcium: 8.6 mg/dL (ref 8.4–10.5)
GFR calc non Af Amer: >60 mL/min (ref >60)
Glucose, Bld: 97 mg/dL (ref 70–99)
Sodium: 134 mEq/L — ABNORMAL LOW (ref 135–145)

## 2010-08-18 LAB — LIPASE, BLOOD: Lipase: 32 U/L (ref 11–59)

## 2010-08-18 NOTE — Discharge Summary (Signed)
[name], [name]               ACCOUNT NO.:  [id number]  MEDICAL RECORD NO.:  [id number]           PATIENT TYPE:  LOCATION:                                 FACILITY:  PHYSICIAN:  Hernandez Losasso S. Shawnie Pons, M.D.   DATE OF BIRTH:  Oct 04, 1985  DATE OF ADMISSION:  08/14/2010 DATE OF DISCHARGE:  08/16/2010                              DISCHARGE SUMMARY   FINAL DIAGNOSES: 1. Monochorionic-diamniotic twin intrauterine pregnancy 32-2/7 weeks. 2. Preterm labor with advanced cervical dilation. 3. Hypertension. 4. Urinary tract infection with Escherichia coli. 5. History of herpes simplex virus outbreak 3 weeks ago. 6. History of preeclampsia x2. 7. Chronic anemia. 8. Abnormal LFTs, chronic.  PERTINENT LABORATORY FINDINGS:  Hemoglobin of 7.6, platelet count 298. A CMP that revealed a creatinine of 0.58 and elevated LFTs at 61 and 54. Urinalysis that revealed no protein, small amount of blood.  Blood type A positive.  Protein and creatinine ratio 0.13.  She had a mag level of 7.0.    OTHER PERTINENT FINDINGS:  include advanced cervical dilation of 4 cm on admission and contractions that were every 5 minutes.  REASON FOR HOSPITALIZATION:  Briefly, the patient is a 25 year old G6, P 1-1-3-2 who is at 32-2/7 weeks monochorionic-diamniotic twins.  She previously had been admitted the week prior to this admission and was found to be 2 cm dilated.  She is also chronically hypertensive, has a history of preeclampsia x2 and her blood pressures have been somewhat elevated and so she had been ruled out for superimposed preeclampsia x2 as well.  She presented on the morning of admission to the outpatient clinic having contractions every 5 minutes and her cervix was noted to be 4 cm, 70%, -1 with bulging bag of water and decision was made to admit her.  HOSPITAL COURSE:  The patient was admitted.  Her Procardia that she had been on did not seem to be holding her, so she was started on  magnesium sulfate to make her contractions stop.  She was also started on Rocephin for urinary tract infection.  She also has known GBS bacteriuria and have obtained penicillin prior to coming into the hospital.  She was restarted on her labetalol.  She was given IV fluid bolus.  Her magnesium had to be increased to 3 g per hour to stop her contractions but they did stop and so on hospital day #2, her magnesium was discontinued approximately 24 hours after starting the medicine and she was resumed back on her Procardia.  The next morning her blood pressure was mildly elevated.  She had not been contracting.  She was desiring to go home.  Repeat labs were done and are as noted above.  Her LFTs had been abnormal 2 weeks ago and had come back toward normal but not completely normalized and then were abnormal again.  She did not have low platelets.  She did not have an abnormal creatinine.  Her blood pressures were stable over the previous 4 hours.  She had no protein on urine dip and normal protein and creatinine ratio and her babies were reactive x2.  Her cervix  was reexamined.  She was now 3, 50, posterior and high and felt like she was improved enough for close outpatient followup.  She will return on Monday which is in 2 days with a 24-hour urine in hand, repeat labs and NST.  DISCHARGE DISPOSITION AND CONDITION:  The patient was discharged home in improved condition.  Followup will be in the [facility] on March 5 at 0900 hours.  She is given a 24-urine juf to collect prior to coming and repeat labs will be obtained at that time.  Additionally, PIH precautions are reviewed as well as preterm labor precautions.  DISCHARGE MEDICATIONS: 1. Valtrex 500 mg twice daily. 2. Lidocaine 2% cream apply to HSV lesions if needed. 3. Tylenol 325 mg for headache. 4. Protonix 40 mg daily for heartburn. 5. Prometrium 200 vaginally at nightly. 6. Prenatal vitamins daily. 7. Labetalol 100 mg  twice daily. 8. She will stop the amoxicillin.  She had previously been on for GBS     bacteria. New medications include Keflex 500 mg 3 times daily for 5 additional days to complete treatment of the E. coli UTI and Procardia, we will increase this to 60 mg XL twice daily for the next 3-4 weeks to keep her contractions away.     Shelbie Proctor. Shawnie Pons, M.D.     TSP/MEDQ  D:  08/16/2010  T:  08/17/2010  Job:  [id number]  Electronically Signed by Tinnie Gens M.D. on 08/18/2010 12:53:09 PM

## 2010-08-19 LAB — COMPREHENSIVE METABOLIC PANEL
ALT: 50 U/L — ABNORMAL HIGH (ref 0–35)
AST: 58 U/L — ABNORMAL HIGH (ref 0–37)
Alkaline Phosphatase: 127 U/L — ABNORMAL HIGH (ref 39–117)
CO2: 24 mEq/L (ref 19–32)
Chloride: 102 mEq/L (ref 96–112)
GFR calc Af Amer: >60 mL/min (ref >60)
GFR calc non Af Amer: >60 mL/min (ref >60)
Potassium: 3.7 mEq/L (ref 3.5–5.1)
Sodium: 132 mEq/L — ABNORMAL LOW (ref 135–145)
Total Bilirubin: 1 mg/dL (ref 0.3–1.2)

## 2010-08-19 LAB — CREATININE CLEARANCE, URINE, 24 HOUR: Creatinine, Urine: 166.4 mg/dL

## 2010-08-21 ENCOUNTER — Other Ambulatory Visit: Payer: Self-pay

## 2010-08-21 DIAGNOSIS — O30009 Twin pregnancy, unspecified number of placenta and unspecified number of amniotic sacs, unspecified trimester: Secondary | ICD-10-CM

## 2010-08-21 LAB — POCT URINALYSIS DIPSTICK
Bilirubin Urine: NEGATIVE
Glucose, UA: NEGATIVE mg/dL
Specific Gravity, Urine: 1.015 (ref 1.005–1.030)

## 2010-08-21 NOTE — Discharge Summary (Signed)
  NAMECASSIOPEIA, [name]               ACCOUNT NO.:  [id number]  MEDICAL RECORD NO.:  [id number]           PATIENT TYPE:  LOCATION:                                 FACILITY:  PHYSICIAN:  Horton Chin, MD DATE OF BIRTH:  [birth year]  DATE OF ADMISSION:  08/10/2010 DATE OF DISCHARGE:  08/12/2010                              DISCHARGE SUMMARY   ADMISSION DIAGNOSES: 1. Intrauterine pregnancy at 31 weeks and 6 days. 2. Monochorionic diamniotic twin gestation. 3. Threatened preterm labor. 4. Active genital herpes infection. 5. Gestational hypertension.  DISCHARGE DIAGNOSES: 1. Intrauterine twin gestation at 32-1/7 weeks' gestation. 2. No further evidence of preterm labor. 3. No evidence of superimposed preeclampsia.  PROCEDURES: 1. Tocolysis initially with magnesium sulfate then Procardia. 2. Treatment with Valtrex for her active genital herpes infection. 3. Treatment with amoxicillin for her urinary tract infection.  PERTINENT STUDIES:  White blood cell count of 8.0, hemoglobin of 7.4, platelet count of 288, ALT initially of 48, AST 41.  This decreased to AST of 44 and 38 at time of discharge.  24-hour urine protein of 72 mg.  BRIEF HOSPITAL COURSE:  The patient is a 25 year old, gravida 6, para 2- 0-3-2, who was admitted at 31-6/7 weeks' for a concern about preterm contraction.  The patient was initially evaluated and found to be 2-cm dilated.  She was immediately started on magnesium sulfate for tocolysis and cerebral palsy protection.  The patient was continued on the magnesium sulfate for 12 hours.  During this time, she also received penicillin for unknown GBS and she was carefully observed and had no further evidence of cervical change.  The patient did have some elevated blood pressures and had labs remarkable for elevated liver function tests.  A followup 24-hour urine protein that was done returned with a value of 72 mg and her LFTs were noted to be trending  down. The patient had been on Labetalol for a diagnosis of gestational hypertension, but had no signs or symptoms of superimposed preeclampsia during her stay.  The patient had a reactive fetal tracing for both twins during her stay and had no other problems.  A recent ultrasound that was done on August 04, 2010, showed that the babies were concordant.  After 48 hours of observation, no progress in preterm labor or any other concerning symptoms,  the patient was deemed stable for discharge to home.  DISCHARGE INSTRUCTIONS:  The patient was given routine prenatal discharge instructions including preterm labor precautions.  She was given a prescription of Procardia XL 30 mg b.i.d. to use as instructed and also told to follow up in the [facility] on August 14, 2010, at 9 a.m. for followup visit.  She was told to call or come back in if she has any concerns prior to this visit.     Horton Chin, MD     UAA/MEDQ  D:  08/12/2010  T:  08/13/2010  Job:  [id number]  Electronically Signed by Jaynie Collins MD on 08/20/2010 12:13:58 PM

## 2010-08-22 LAB — BILE ACIDS, TOTAL: Bile Acids Total: 33 umol/L — ABNORMAL HIGH (ref 0–19)

## 2010-08-23 ENCOUNTER — Inpatient Hospital Stay (HOSPITAL_COMMUNITY)
Admission: AD | Admit: 2010-08-23 | Discharge: 2010-08-26 | DRG: 781 | Disposition: A | Discharge status: Home / Self Care | Payer: Medicaid Other | Source: Ambulatory Visit | Attending: Obstetrics & Gynecology | Admitting: Obstetrics & Gynecology

## 2010-08-23 ENCOUNTER — Inpatient Hospital Stay (HOSPITAL_COMMUNITY)
Admission: AD | Admit: 2010-08-23 | Discharge: 2010-08-23 | Disposition: A | Discharge status: Home / Self Care | Payer: Medicaid Other | Source: Ambulatory Visit | Attending: Obstetrics & Gynecology | Admitting: Obstetrics & Gynecology

## 2010-08-23 DIAGNOSIS — O10019 Pre-existing essential hypertension complicating pregnancy, unspecified trimester: Principal | ICD-10-CM | POA: Diagnosis present

## 2010-08-23 DIAGNOSIS — O30009 Twin pregnancy, unspecified number of placenta and unspecified number of amniotic sacs, unspecified trimester: Secondary | ICD-10-CM

## 2010-08-23 DIAGNOSIS — O4700 False labor before 37 completed weeks of gestation, unspecified trimester: Secondary | ICD-10-CM

## 2010-08-23 LAB — CBC
HCT: 30.1 % — ABNORMAL LOW (ref 36.0–46.0)
MCV: 85.5 fL (ref 78.0–100.0)
Platelets: 310 10*3/uL (ref 150–400)
RBC: 3.52 MIL/uL — ABNORMAL LOW (ref 3.87–5.11)
WBC: 8.7 10*3/uL (ref 4.0–10.5)

## 2010-08-24 ENCOUNTER — Inpatient Hospital Stay (HOSPITAL_COMMUNITY): Payer: Medicaid Other

## 2010-08-24 LAB — URIC ACID: Uric Acid, Serum: 5.1 mg/dL (ref 2.4–7.0)

## 2010-08-24 LAB — COMPREHENSIVE METABOLIC PANEL
ALT: 35 U/L (ref 0–35)
Albumin: 2.4 g/dL — ABNORMAL LOW (ref 3.5–5.2)
Alkaline Phosphatase: 150 U/L — ABNORMAL HIGH (ref 39–117)
BUN: 3 mg/dL — ABNORMAL LOW (ref 6–23)
Chloride: 104 mEq/L (ref 96–112)
Glucose, Bld: 66 mg/dL — ABNORMAL LOW (ref 70–99)
Potassium: 3.9 mEq/L (ref 3.5–5.1)
Total Bilirubin: 1.8 mg/dL — ABNORMAL HIGH (ref 0.3–1.2)

## 2010-08-24 LAB — URINALYSIS, ROUTINE W REFLEX MICROSCOPIC
Bilirubin Urine: NEGATIVE
Nitrite: NEGATIVE
Specific Gravity, Urine: >1.03 — ABNORMAL HIGH (ref 1.005–1.030)
pH: 6 (ref 5.0–8.0)

## 2010-08-24 LAB — URINALYSIS, DIPSTICK ONLY
Nitrite: NEGATIVE
Protein, ur: NEGATIVE mg/dL
Specific Gravity, Urine: >1.03 — ABNORMAL HIGH (ref 1.005–1.030)
Urobilinogen, UA: 1 mg/dL (ref 0.0–1.0)

## 2010-08-24 LAB — PROTEIN / CREATININE RATIO, URINE: Creatinine, Urine: 150.2 mg/dL

## 2010-08-25 ENCOUNTER — Ambulatory Visit (HOSPITAL_COMMUNITY)
Admission: RE | Admit: 2010-08-25 | Discharge: 2010-08-25 | DRG: 778 | Disposition: A | Discharge status: Home / Self Care | Payer: Medicaid Other | Source: Ambulatory Visit | Attending: Obstetrics & Gynecology | Admitting: Obstetrics & Gynecology

## 2010-08-25 ENCOUNTER — Ambulatory Visit (HOSPITAL_COMMUNITY)
Admission: RE | Admit: 2010-08-25 | Discharge: 2010-08-25 | Disposition: A | Discharge status: Home / Self Care | Payer: Medicaid Other | Source: Ambulatory Visit | Attending: Obstetrics & Gynecology | Admitting: Obstetrics & Gynecology

## 2010-08-25 DIAGNOSIS — O3440 Maternal care for other abnormalities of cervix, unspecified trimester: Secondary | ICD-10-CM | POA: Diagnosis present

## 2010-08-25 DIAGNOSIS — O30009 Twin pregnancy, unspecified number of placenta and unspecified number of amniotic sacs, unspecified trimester: Secondary | ICD-10-CM | POA: Diagnosis present

## 2010-08-25 DIAGNOSIS — IMO0001 Reserved for inherently not codable concepts without codable children: Secondary | ICD-10-CM

## 2010-08-25 DIAGNOSIS — O10019 Pre-existing essential hypertension complicating pregnancy, unspecified trimester: Secondary | ICD-10-CM | POA: Diagnosis present

## 2010-08-25 DIAGNOSIS — O26879 Cervical shortening, unspecified trimester: Secondary | ICD-10-CM | POA: Diagnosis present

## 2010-08-25 DIAGNOSIS — O4700 False labor before 37 completed weeks of gestation, unspecified trimester: Secondary | ICD-10-CM | POA: Diagnosis present

## 2010-08-25 LAB — COMPREHENSIVE METABOLIC PANEL
ALT: 22 U/L (ref 0–35)
AST: 31 U/L (ref 0–37)
AST: 15 U/L (ref 0–37)
Albumin: 2.7 g/dL — ABNORMAL LOW (ref 3.5–5.2)
Alkaline Phosphatase: 109 U/L (ref 39–117)
BUN: 5 mg/dL — ABNORMAL LOW (ref 6–23)
CO2: 26 mEq/L (ref 19–32)
Calcium: 8.2 mg/dL — ABNORMAL LOW (ref 8.4–10.5)
Calcium: 8.6 mg/dL (ref 8.4–10.5)
Chloride: 103 mEq/L (ref 96–112)
Chloride: 102 mEq/L (ref 96–112)
Creatinine, Ser: 0.62 mg/dL (ref 0.4–1.2)
GFR calc Af Amer: >60 mL/min (ref >60)
GFR calc non Af Amer: >60 mL/min (ref >60)
Glucose, Bld: 92 mg/dL (ref 70–99)
Sodium: 134 mEq/L — ABNORMAL LOW (ref 135–145)
Total Bilirubin: 0.9 mg/dL (ref 0.3–1.2)
Total Bilirubin: 0.5 mg/dL (ref 0.3–1.2)

## 2010-08-25 LAB — POCT URINALYSIS DIPSTICK
Bilirubin Urine: NEGATIVE
Glucose, UA: NEGATIVE mg/dL
Glucose, UA: NEGATIVE mg/dL
Ketones, ur: NEGATIVE mg/dL
Nitrite: NEGATIVE
Protein, ur: 30 mg/dL — AB
Protein, ur: NEGATIVE mg/dL
Specific Gravity, Urine: 1.02 (ref 1.005–1.030)
Specific Gravity, Urine: 1.025 (ref 1.005–1.030)
Specific Gravity, Urine: 1.025 (ref 1.005–1.030)
Urobilinogen, UA: 0.2 mg/dL (ref 0.0–1.0)
pH: 7 (ref 5.0–8.0)
pH: 7 (ref 5.0–8.0)

## 2010-08-25 LAB — URINALYSIS, ROUTINE W REFLEX MICROSCOPIC
Bilirubin Urine: NEGATIVE
Nitrite: NEGATIVE
Specific Gravity, Urine: 1.025 (ref 1.005–1.030)
Urobilinogen, UA: 1 mg/dL (ref 0.0–1.0)
pH: 6.5 (ref 5.0–8.0)

## 2010-08-25 LAB — CREATININE CLEARANCE, URINE, 24 HOUR
Collection Interval-CRCL: 24 hours
Creatinine Clearance: 151 mL/min — ABNORMAL HIGH (ref 75–115)
Creatinine, 24H Ur: 1409 mg/d (ref 700–1800)
Creatinine, Urine: 67.1 mg/dL
Creatinine: 0.65 mg/dL (ref 0.4–1.2)

## 2010-08-25 LAB — CBC
MCH: 29.9 pg (ref 26.0–34.0)
MCHC: 31.8 g/dL (ref 30.0–36.0)
MCV: 94 fL (ref 78.0–100.0)
Platelets: 321 10*3/uL (ref 150–400)
RDW: 13 % (ref 11.5–15.5)

## 2010-08-25 LAB — PROTEIN / CREATININE RATIO, URINE
Creatinine, Urine: 252.1 mg/dL
Total Protein, Urine: 25 mg/dL

## 2010-08-25 LAB — URINE MICROSCOPIC-ADD ON

## 2010-08-26 LAB — POCT URINALYSIS DIPSTICK
Bilirubin Urine: NEGATIVE
Glucose, UA: NEGATIVE mg/dL
Nitrite: NEGATIVE
Protein, ur: NEGATIVE mg/dL
Protein, ur: NEGATIVE mg/dL
Specific Gravity, Urine: 1.025 (ref 1.005–1.030)
Urobilinogen, UA: 1 mg/dL (ref 0.0–1.0)
pH: 6 (ref 5.0–8.0)
pH: 7 (ref 5.0–8.0)

## 2010-08-26 LAB — PROTEIN, URINE, 24 HOUR: Urine Total Volume-UPROT: 2100 mL

## 2010-08-27 ENCOUNTER — Inpatient Hospital Stay (HOSPITAL_COMMUNITY)
Admission: AD | Admit: 2010-08-27 | Discharge: 2010-08-27 | Disposition: A | Discharge status: Home / Self Care | Payer: Medicaid Other | Source: Ambulatory Visit | Attending: Obstetrics & Gynecology | Admitting: Obstetrics & Gynecology

## 2010-08-27 DIAGNOSIS — O4700 False labor before 37 completed weeks of gestation, unspecified trimester: Secondary | ICD-10-CM | POA: Insufficient documentation

## 2010-08-27 LAB — URINE CULTURE
Colony Count: >=100000
Culture  Setup Time: 201110032154

## 2010-08-27 LAB — CBC
HCT: 28 % — ABNORMAL LOW (ref 36.0–46.0)
MCV: 92.9 fL (ref 78.0–100.0)
Platelets: 274 10*3/uL (ref 150–400)
RBC: 3.01 MIL/uL — ABNORMAL LOW (ref 3.87–5.11)
WBC: 7.9 10*3/uL (ref 4.0–10.5)

## 2010-08-27 LAB — URINALYSIS, ROUTINE W REFLEX MICROSCOPIC
Glucose, UA: NEGATIVE mg/dL
Glucose, UA: NEGATIVE mg/dL
Ketones, ur: >80 mg/dL — AB
Ketones, ur: >80 mg/dL — AB
Ketones, ur: NEGATIVE mg/dL
Nitrite: NEGATIVE
Protein, ur: 100 mg/dL — AB
Protein, ur: 30 mg/dL — AB
Protein, ur: 30 mg/dL — AB
Specific Gravity, Urine: >1.03 — ABNORMAL HIGH (ref 1.005–1.030)
Urobilinogen, UA: 4 mg/dL — ABNORMAL HIGH (ref 0.0–1.0)

## 2010-08-27 LAB — COMPREHENSIVE METABOLIC PANEL
Albumin: 3 g/dL — ABNORMAL LOW (ref 3.5–5.2)
Alkaline Phosphatase: 36 U/L — ABNORMAL LOW (ref 39–117)
BUN: 4 mg/dL — ABNORMAL LOW (ref 6–23)
Chloride: 101 mEq/L (ref 96–112)
Glucose, Bld: 96 mg/dL (ref 70–99)
Potassium: 3.3 mEq/L — ABNORMAL LOW (ref 3.5–5.1)
Total Bilirubin: 0.7 mg/dL (ref 0.3–1.2)

## 2010-08-27 LAB — POCT URINALYSIS DIPSTICK
Ketones, ur: 15 mg/dL — AB
Nitrite: NEGATIVE
Protein, ur: 100 mg/dL — AB
Protein, ur: NEGATIVE mg/dL
Specific Gravity, Urine: >=1.03 (ref 1.005–1.030)
Urobilinogen, UA: 0.2 mg/dL (ref 0.0–1.0)
Urobilinogen, UA: 1 mg/dL (ref 0.0–1.0)
Urobilinogen, UA: 2 mg/dL — ABNORMAL HIGH (ref 0.0–1.0)
pH: 6 (ref 5.0–8.0)
pH: 7.5 (ref 5.0–8.0)

## 2010-08-27 LAB — URINE MICROSCOPIC-ADD ON

## 2010-08-28 ENCOUNTER — Other Ambulatory Visit: Payer: Self-pay

## 2010-08-28 DIAGNOSIS — O169 Unspecified maternal hypertension, unspecified trimester: Secondary | ICD-10-CM

## 2010-08-28 DIAGNOSIS — O26879 Cervical shortening, unspecified trimester: Secondary | ICD-10-CM

## 2010-08-28 DIAGNOSIS — O30009 Twin pregnancy, unspecified number of placenta and unspecified number of amniotic sacs, unspecified trimester: Secondary | ICD-10-CM

## 2010-08-28 LAB — CBC
HCT: 29.6 % — ABNORMAL LOW (ref 36.0–46.0)
HCT: 35.6 % — ABNORMAL LOW (ref 36.0–46.0)
HCT: 34.3 % — ABNORMAL LOW (ref 36.0–46.0)
Hemoglobin: 10.4 g/dL — ABNORMAL LOW (ref 12.0–15.0)
Hemoglobin: 12.1 g/dL (ref 12.0–15.0)
Hemoglobin: 11.6 g/dL — ABNORMAL LOW (ref 12.0–15.0)
MCH: 32.3 pg (ref 26.0–34.0)
MCH: 31.6 pg (ref 26.0–34.0)
MCH: 31.7 pg (ref 26.0–34.0)
MCHC: 34.3 g/dL (ref 30.0–36.0)
MCHC: 35 g/dL (ref 30.0–36.0)
MCHC: 33.2 g/dL (ref 30.0–36.0)
MCHC: 33.7 g/dL (ref 30.0–36.0)
MCV: 93.4 fL (ref 78.0–100.0)
MCV: 93.8 fL (ref 78.0–100.0)
MCV: 94.1 fL (ref 78.0–100.0)
Platelets: 321 10*3/uL (ref 150–400)
Platelets: 355 10*3/uL (ref 150–400)
Platelets: 337 10*3/uL (ref 150–400)
Platelets: 344 10*3/uL (ref 150–400)
RBC: 3.65 MIL/uL — ABNORMAL LOW (ref 3.87–5.11)
RBC: 3.64 MIL/uL — ABNORMAL LOW (ref 3.87–5.11)
RDW: 12.6 % (ref 11.5–15.5)
RDW: 12.7 % (ref 11.5–15.5)
RDW: 12.9 % (ref 11.5–15.5)
RDW: 12.8 % (ref 11.5–15.5)
RDW: 12.9 % (ref 11.5–15.5)
WBC: 7.6 10*3/uL (ref 4.0–10.5)
WBC: 7.2 10*3/uL (ref 4.0–10.5)
WBC: 8.2 10*3/uL (ref 4.0–10.5)

## 2010-08-28 LAB — URINALYSIS, ROUTINE W REFLEX MICROSCOPIC
Bilirubin Urine: NEGATIVE
Bilirubin Urine: NEGATIVE
Bilirubin Urine: NEGATIVE
Glucose, UA: NEGATIVE mg/dL
Glucose, UA: NEGATIVE mg/dL
Glucose, UA: NEGATIVE mg/dL
Glucose, UA: NEGATIVE mg/dL
Ketones, ur: 15 mg/dL — AB
Ketones, ur: NEGATIVE mg/dL
Ketones, ur: 15 mg/dL — AB
Ketones, ur: NEGATIVE mg/dL
Leukocytes, UA: NEGATIVE
Leukocytes, UA: NEGATIVE
Leukocytes, UA: NEGATIVE
Leukocytes, UA: NEGATIVE
Nitrite: NEGATIVE
Nitrite: NEGATIVE
Nitrite: NEGATIVE
Nitrite: NEGATIVE
Nitrite: NEGATIVE
Protein, ur: NEGATIVE mg/dL
Protein, ur: NEGATIVE mg/dL
Protein, ur: NEGATIVE mg/dL
Specific Gravity, Urine: 1.02 (ref 1.005–1.030)
Specific Gravity, Urine: <1.005 — ABNORMAL LOW (ref 1.005–1.030)
Specific Gravity, Urine: 1.02 (ref 1.005–1.030)
Specific Gravity, Urine: 1.03 (ref 1.005–1.030)
Urobilinogen, UA: 1 mg/dL (ref 0.0–1.0)
pH: 5 (ref 5.0–8.0)
pH: 6.5 (ref 5.0–8.0)
pH: 7 (ref 5.0–8.0)
pH: 7.5 (ref 5.0–8.0)
pH: 8 (ref 5.0–8.0)

## 2010-08-28 LAB — HEPATIC FUNCTION PANEL
ALT: 23 U/L (ref 0–35)
AST: 22 U/L (ref 0–37)
Albumin: 3.7 g/dL (ref 3.5–5.2)
Alkaline Phosphatase: 36 U/L — ABNORMAL LOW (ref 39–117)
Bilirubin, Direct: 0.2 mg/dL (ref 0.0–0.3)
Indirect Bilirubin: 0.6 mg/dL (ref 0.3–0.9)
Total Bilirubin: 0.8 mg/dL (ref 0.3–1.2)
Total Protein: 6.9 g/dL (ref 6.0–8.3)

## 2010-08-28 LAB — COMPREHENSIVE METABOLIC PANEL
ALT: 20 U/L (ref 0–35)
ALT: 29 U/L (ref 0–35)
ALT: 14 U/L (ref 0–35)
AST: 15 U/L (ref 0–37)
AST: 17 U/L (ref 0–37)
AST: 22 U/L (ref 0–37)
Albumin: 3.7 g/dL (ref 3.5–5.2)
Albumin: 3.7 g/dL (ref 3.5–5.2)
Albumin: 4 g/dL (ref 3.5–5.2)
Alkaline Phosphatase: 38 U/L — ABNORMAL LOW (ref 39–117)
Alkaline Phosphatase: 41 U/L (ref 39–117)
BUN: 4 mg/dL — ABNORMAL LOW (ref 6–23)
CO2: 25 mEq/L (ref 19–32)
Calcium: 9.5 mg/dL (ref 8.4–10.5)
Calcium: 9.5 mg/dL (ref 8.4–10.5)
Chloride: 100 mEq/L (ref 96–112)
Chloride: 100 mEq/L (ref 96–112)
Chloride: 101 mEq/L (ref 96–112)
Creatinine, Ser: 0.64 mg/dL (ref 0.4–1.2)
Creatinine, Ser: 0.79 mg/dL (ref 0.4–1.2)
GFR calc Af Amer: >60 mL/min (ref >60)
GFR calc Af Amer: >60 mL/min (ref >60)
GFR calc Af Amer: >60 mL/min (ref >60)
GFR calc non Af Amer: >60 mL/min (ref >60)
GFR calc non Af Amer: >60 mL/min (ref >60)
Glucose, Bld: 84 mg/dL (ref 70–99)
Potassium: 4.2 mEq/L (ref 3.5–5.1)
Potassium: 3.7 mEq/L (ref 3.5–5.1)
Sodium: 133 mEq/L — ABNORMAL LOW (ref 135–145)
Sodium: 133 mEq/L — ABNORMAL LOW (ref 135–145)
Sodium: 133 mEq/L — ABNORMAL LOW (ref 135–145)
Total Bilirubin: 0.8 mg/dL (ref 0.3–1.2)
Total Protein: 7.2 g/dL (ref 6.0–8.3)
Total Protein: 7.5 g/dL (ref 6.0–8.3)

## 2010-08-28 LAB — POCT URINALYSIS DIPSTICK
Bilirubin Urine: NEGATIVE
Ketones, ur: 15 mg/dL — AB
Ketones, ur: NEGATIVE mg/dL
Protein, ur: NEGATIVE mg/dL
Specific Gravity, Urine: 1.015 (ref 1.005–1.030)
Specific Gravity, Urine: 1.025 (ref 1.005–1.030)
Urobilinogen, UA: 1 mg/dL (ref 0.0–1.0)
pH: 6 (ref 5.0–8.0)
pH: 6 (ref 5.0–8.0)

## 2010-08-28 LAB — URINE MICROSCOPIC-ADD ON

## 2010-08-28 LAB — BASIC METABOLIC PANEL WITH GFR
BUN: 3 mg/dL — ABNORMAL LOW (ref 6–23)
CO2: 26 meq/L (ref 19–32)
Calcium: 9.2 mg/dL (ref 8.4–10.5)
Chloride: 101 meq/L (ref 96–112)
Creatinine, Ser: 0.74 mg/dL (ref 0.4–1.2)
GFR calc non Af Amer: >60 mL/min
Glucose, Bld: 82 mg/dL (ref 70–99)
Potassium: 3.7 meq/L (ref 3.5–5.1)
Sodium: 132 meq/L — ABNORMAL LOW (ref 135–145)

## 2010-08-28 LAB — TSH: TSH: 0.313 u[IU]/mL — ABNORMAL LOW (ref 0.350–4.500)

## 2010-08-28 LAB — LIPASE, BLOOD: Lipase: 26 U/L (ref 11–59)

## 2010-08-28 LAB — AMYLASE: Amylase: 106 U/L — ABNORMAL HIGH (ref 0–105)

## 2010-08-28 LAB — DIFFERENTIAL
Basophils Absolute: 0 10*3/uL (ref 0.0–0.1)
Basophils Relative: 1 % (ref 0–1)
Eosinophils Relative: 2 % (ref 0–5)
Lymphocytes Relative: 41 % (ref 12–46)
Monocytes Absolute: 0.5 10*3/uL (ref 0.1–1.0)
Monocytes Relative: 9 % (ref 3–12)

## 2010-08-28 LAB — BASIC METABOLIC PANEL
BUN: 2 mg/dL — ABNORMAL LOW (ref 6–23)
Chloride: 104 mEq/L (ref 96–112)
Glucose, Bld: 74 mg/dL (ref 70–99)
Potassium: 3.8 mEq/L (ref 3.5–5.1)
Sodium: 135 mEq/L (ref 135–145)

## 2010-08-28 LAB — URINE CULTURE

## 2010-08-28 LAB — HCG, QUANTITATIVE, PREGNANCY

## 2010-08-28 LAB — GLUCOSE, CAPILLARY: Glucose-Capillary: 124 mg/dL — ABNORMAL HIGH (ref 70–99)

## 2010-08-29 ENCOUNTER — Inpatient Hospital Stay (HOSPITAL_COMMUNITY)
Admission: AD | Admit: 2010-08-29 | Discharge: 2010-09-01 | DRG: 767 | Disposition: A | Discharge status: Home / Self Care | Payer: Medicaid Other | Source: Ambulatory Visit | Attending: Obstetrics & Gynecology | Admitting: Obstetrics & Gynecology

## 2010-08-29 ENCOUNTER — Encounter (HOSPITAL_COMMUNITY): Payer: Medicaid Other

## 2010-08-29 ENCOUNTER — Inpatient Hospital Stay (HOSPITAL_COMMUNITY): Payer: Medicaid Other

## 2010-08-29 ENCOUNTER — Ambulatory Visit (HOSPITAL_COMMUNITY): Payer: Medicaid Other

## 2010-08-29 DIAGNOSIS — O30009 Twin pregnancy, unspecified number of placenta and unspecified number of amniotic sacs, unspecified trimester: Secondary | ICD-10-CM | POA: Diagnosis present

## 2010-08-29 DIAGNOSIS — Z302 Encounter for sterilization: Secondary | ICD-10-CM

## 2010-08-29 DIAGNOSIS — O411090 Infection of amniotic sac and membranes, unspecified, unspecified trimester, not applicable or unspecified: Principal | ICD-10-CM | POA: Diagnosis present

## 2010-08-29 DIAGNOSIS — O30039 Twin pregnancy, monochorionic/diamniotic, unspecified trimester: Secondary | ICD-10-CM

## 2010-08-29 LAB — URINALYSIS, ROUTINE W REFLEX MICROSCOPIC
Glucose, UA: NEGATIVE mg/dL
Glucose, UA: NEGATIVE mg/dL
Ketones, ur: NEGATIVE mg/dL
Ketones, ur: >80 mg/dL — AB
Leukocytes, UA: NEGATIVE
Leukocytes, UA: NEGATIVE
Nitrite: NEGATIVE
Nitrite: NEGATIVE
Protein, ur: NEGATIVE mg/dL
Specific Gravity, Urine: >1.03 — ABNORMAL HIGH (ref 1.005–1.030)
Specific Gravity, Urine: 1.02 (ref 1.005–1.030)
Urobilinogen, UA: 1 mg/dL (ref 0.0–1.0)
pH: 6 (ref 5.0–8.0)
pH: 7.5 (ref 5.0–8.0)

## 2010-08-29 LAB — INFLUENZA PANEL BY PCR (TYPE A & B)
H1N1 flu by pcr: NOT DETECTED — AB
Influenza A By PCR: NEGATIVE
Influenza B By PCR: NEGATIVE

## 2010-08-29 LAB — CBC
HCT: 26.8 % — ABNORMAL LOW (ref 36.0–46.0)
Hemoglobin: 8 g/dL — ABNORMAL LOW (ref 12.0–15.0)
MCH: 31.3 pg (ref 26.0–34.0)
MCH: 24.8 pg — ABNORMAL LOW (ref 26.0–34.0)
MCHC: 33.3 g/dL (ref 30.0–36.0)
MCHC: 29.9 g/dL — ABNORMAL LOW (ref 30.0–36.0)
MCV: 94 fL (ref 78.0–100.0)
MCV: 83.2 fL (ref 78.0–100.0)
Platelets: 463 10*3/uL — ABNORMAL HIGH (ref 150–400)
Platelets: 313 K/uL (ref 150–400)
RBC: 3.58 MIL/uL — ABNORMAL LOW (ref 3.87–5.11)
RBC: 3.22 MIL/uL — ABNORMAL LOW (ref 3.87–5.11)
RDW: 12.3 % (ref 11.5–15.5)
RDW: 17.2 % — ABNORMAL HIGH (ref 11.5–15.5)
WBC: 12.9 K/uL — ABNORMAL HIGH (ref 4.0–10.5)

## 2010-08-29 LAB — COMPREHENSIVE METABOLIC PANEL
AST: 34 U/L (ref 0–37)
BUN: 1 mg/dL — ABNORMAL LOW (ref 6–23)
CO2: 21 mEq/L (ref 19–32)
Calcium: 8.4 mg/dL (ref 8.4–10.5)
Chloride: 101 mEq/L (ref 96–112)
Creatinine, Ser: 0.72 mg/dL (ref 0.4–1.2)
GFR calc Af Amer: >60 mL/min (ref >60)
GFR calc non Af Amer: >60 mL/min (ref >60)
Total Bilirubin: 1.5 mg/dL — ABNORMAL HIGH (ref 0.3–1.2)

## 2010-08-29 LAB — URINE MICROSCOPIC-ADD ON

## 2010-08-29 LAB — GC/CHLAMYDIA PROBE AMP, GENITAL: GC Probe Amp, Genital: NEGATIVE

## 2010-08-30 ENCOUNTER — Other Ambulatory Visit: Payer: Self-pay | Admitting: Obstetrics & Gynecology

## 2010-08-30 DIAGNOSIS — O411090 Infection of amniotic sac and membranes, unspecified, unspecified trimester, not applicable or unspecified: Secondary | ICD-10-CM

## 2010-08-30 DIAGNOSIS — O30009 Twin pregnancy, unspecified number of placenta and unspecified number of amniotic sacs, unspecified trimester: Secondary | ICD-10-CM

## 2010-08-30 LAB — RAPID STREP SCREEN (MED CTR MEBANE ONLY): Streptococcus, Group A Screen (Direct): POSITIVE — AB

## 2010-08-30 LAB — CBC
HCT: 23.8 % — ABNORMAL LOW (ref 36.0–46.0)
Hemoglobin: 7.1 g/dL — ABNORMAL LOW (ref 12.0–15.0)
MCV: 82.6 fL (ref 78.0–100.0)
RBC: 2.88 MIL/uL — ABNORMAL LOW (ref 3.87–5.11)
RDW: 17.4 % — ABNORMAL HIGH (ref 11.5–15.5)
WBC: 16.1 10*3/uL — ABNORMAL HIGH (ref 4.0–10.5)

## 2010-08-30 LAB — DIFFERENTIAL
Basophils Absolute: 0 10*3/uL (ref 0.0–0.1)
Eosinophils Relative: 0 % (ref 0–5)
Lymphocytes Relative: 10 % — ABNORMAL LOW (ref 12–46)
Lymphs Abs: 1.7 10*3/uL (ref 0.7–4.0)
Neutro Abs: 13.3 10*3/uL — ABNORMAL HIGH (ref 1.7–7.7)
Neutrophils Relative %: 83 % — ABNORMAL HIGH (ref 43–77)

## 2010-08-30 LAB — TYPE AND SCREEN: Antibody Screen: NEGATIVE

## 2010-08-31 DIAGNOSIS — Z302 Encounter for sterilization: Secondary | ICD-10-CM

## 2010-08-31 LAB — CBC
HCT: 25 % — ABNORMAL LOW (ref 36.0–46.0)
Hemoglobin: 7.2 g/dL — ABNORMAL LOW (ref 12.0–15.0)
MCH: 24.1 pg — ABNORMAL LOW (ref 26.0–34.0)
RBC: 2.99 MIL/uL — ABNORMAL LOW (ref 3.87–5.11)

## 2010-08-31 LAB — URINE CULTURE
Colony Count: NO GROWTH
Special Requests: NEGATIVE

## 2010-09-02 LAB — URINALYSIS, ROUTINE W REFLEX MICROSCOPIC
Bilirubin Urine: NEGATIVE
Ketones, ur: NEGATIVE mg/dL
Nitrite: NEGATIVE
Protein, ur: NEGATIVE mg/dL
Urobilinogen, UA: 2 mg/dL — ABNORMAL HIGH (ref 0.0–1.0)

## 2010-09-04 LAB — CULTURE, BLOOD (ROUTINE X 2)
Culture  Setup Time: 201203161728
Culture: NO GROWTH

## 2010-09-05 LAB — CULTURE, BLOOD (ROUTINE X 2): Culture: NO GROWTH

## 2010-09-15 NOTE — Discharge Summary (Addendum)
  NAMECHANNING, [name]:  [id number]  MEDICAL RECORD NO.:  [id number]           PATIENT TYPE:  O  LOCATION:  MFM                           [facility]  [facility]  PHYSICIAN:  Scheryl Darter, MD       DATE OF BIRTH:  08/08/1985  DATE OF ADMISSION:  08/25/2010 DATE OF DISCHARGE:  08/25/2010                              DISCHARGE SUMMARY   DIAGNOSES: 1. Intrauterine pregnancy with twins of 34 weeks' gestation. 2. Threatened preterm labor.  HISTORY OF PRESENT ILLNESS:  The patient is a 25 year old black female gravida 6, para 1-1-3-2, last menstrual period January 19, 2010, estimated date of confinement October 07, 2010 by ultrasound.  The patient presented complaining of contractions on August 24, 2010 at 33 weeks 5 days gestation.  She has had contractions for every 2 minutes.  No rupture of membranes or bleeding.  She noted good fetal movement.  She had been admitted on several occasions in this pregnancy due to threatened preterm labor and she received betamethasone injections and tocolysis had been achieved using with magnesium sulfate and Procardia.  PAST MEDICAL HISTORY:  Chronic hypertension.  GYNECOLOGIC HISTORY:  History of genital herpes and gonorrhea.  PAST SURGICAL HISTORY:  Dilation and curettage in 2010.  SOCIAL HISTORY:  The patient is single.  LABORATORY DATA:  Blood type is A positive.  Group B strep is negative.  PHYSICAL EXAMINATION:  VITAL SIGNS:  Blood pressure 137-140/73-75. CHEST:  Clear. HEART:  Regular rhythm. ABDOMEN:  Gravid consistent with dates and nontender. GENITOURINARY:  Cervix was completely effaced, 4 cm dilated, cephalic -2 station.  HOSPITAL COURSE:  The patient was admitted for observation for preterm labor.  She was given nifedipine 30 mg p.o. q.12 h.  During hospitalization, her blood pressure was normal and her labetalol was held.  At 34 weeks, nifedipine was stopped.  Contractions had abated. She has no complaints of  regular contractions and no leaking of fluid.  She wished to be discharged home.  The patient was discharged on August 26, 2010.  She is to stop her Procardia.  She was given preterm labor precautions.  The patient is scheduled to follow up in [facility] on August 28, 2010.  Questions were answered.     Scheryl Darter, MD     JA/MEDQ  D:  08/26/2010  T:  08/27/2010  Job:  [id number]  Electronically Signed by Scheryl Darter MD on 10/14/2010 10:54:18 AM

## 2010-09-15 NOTE — Discharge Summary (Signed)
  [name], BYE NO.:  [id number]  MEDICAL RECORD NO.:  [id number]           PATIENT TYPE:  LOCATION:                                 FACILITY:  PHYSICIAN:  Scheryl Darter, MD       DATE OF BIRTH:  01-May-1986  DATE OF ADMISSION:  08/17/2010 DATE OF DISCHARGE:  08/19/3010                              DISCHARGE SUMMARY   DIAGNOSES: 1. Intrauterine pregnancy with twins 83 weeks' gestation. 2. Threatened preterm labor. 3. Chronic hypertension.  The patient is a 25 year old black female, gravida 6, para 2-0-3-2, admitted on August 17, 2010, presented at 32 weeks 5 days' gestation with twins.  She has previous episodes of preterm labor and premature cervical dilatation.  She had a problem with elevated blood pressure and is being treated for chronic hypertension.  She complained of headache, nausea and vomiting, blurry vision, and right upper quadrant pain.  She noted good fetal movement.  She also had some contractions up to every 5 minutes with no leaking fluid or bleeding.  Prenatal care was at [facility]. Ultrasound showed twin gestation.  MEDICATIONS: 1. Procardia XL 30 mg p.o. b.i.d. 2. Protonix 40 mg a day. 3. Amoxicillin. 4. Prenatal vitamins. 5. Labetalol 100 mg p.o. b.i.d.  ALLERGIES:  No known drug allergies.  SOCIAL HISTORY:  The patient does not smoke and does not drink.  PHYSICAL EXAMINATION:  VITAL SIGNS:  Blood pressure 146/80, pulse 104, and temperature 97.9. CHEST:  Clear. HEART:  Regular rate and rhythm. ABDOMEN:  Gravid.  Cervix was 4 cm dilated, 70% effaced,-2 station, with normal fetal heart tones, baby A and B, moderate variability.  Urinalysis positive for nitrites, 2-6 white blood cells.    The patient was admitted for evaluation of possible preterm labor with possible preeclampsia.  Liver function tests were elevated in the 50s. A 24-hour urine normal.  She was continued on her Procardia and contractions were  irregular and not painful.  Blood pressure was normal on her current medications.  She requested to be discharged home at 33 weeks' gestation.  She was discharged.  Because she was complaining of some itching, bile acid test was pending at the time of discharge.  She was to continue with her medications that she was taking this admission.  She was given precautions for preterm labor or preeclampsia.  We have instructed her to follow up at the [facility] in 2 days.     Scheryl Darter, MD     JA/MEDQ  D:  08/19/2010  T:  08/20/2010  Job:  [id number]  Electronically Signed by Scheryl Darter MD on 09/15/2010 11:34:38 AM

## 2010-09-15 NOTE — Op Note (Signed)
  [name], [name]:  [id number]  MEDICAL RECORD NO.:  [id number]         PATIENT TYPE:  WINP  LOCATION:  304                           [facility]ILITY:  [facility]  PHYSICIAN:  Scheryl Darter, MD       DATE OF BIRTH:  Dec 13, 1985  DATE OF PROCEDURE: DATE OF DISCHARGE:                              OPERATIVE REPORT   PROCEDURE:  Postpartum bilateral tubal ligation.  PREOPERATIVE DIAGNOSIS:  Undesired fertility.  POSTOPERATIVE DIAGNOSIS:  Tubal sterilization.  SURGEON:  Scheryl Darter, MD.  ANESTHESIA:  Epidural with local.  COMPLICATIONS:  None.  ESTIMATED BLOOD LOSS:  Minimal.  DRAINS:  None.  COUNTS:  Correct.  OPERATIVE COURSE:  The patient gave a written consent for postpartum bilateral tubal ligation after of twin delivery on August 30, 2010, at 34 weeks 4 days gestation.  The patient had epidural in place.  The patient's identification was confirmed.  She was brought to the OR and adequate epidural anesthesia was induced.  She was in dorsal supine position.  Bladder was drained with a red rubber catheter and abdomen was sterilely prepped and draped.  Marcaine 0.5% was infiltrated at the umbilicus.  A transverse infraumbilical skin incision was made with #11 blade approximately 2.5 cm across.  The fascia was elevated and incised with scissors and peritoneal cavity was entered.  Retractors were placed and a sponge was placed to pack the omentum.  The patient was placed in Trendelenburg position.  The right fallopian tube was identified and traced to its distal fimbriated end.  Midportion of the tube was elevated and Filshie clip was applied with good positioning seen.  Same procedure was done to ligate the left fallopian tube.  Both adnexa appeared normal except for some edema.  The fascia and peritoneum were closed after removal of sponge.  0 Vicryl suture was used in a running suture.  Skin was closed with interrupted subcuticular sutures with 4-0 Vicryl.   Sterile dressing was applied.  The patient tolerated the procedure well without complications, and she was brought in stable condition to the recovery room.     Scheryl Darter, MD     JA/MEDQ  D:  08/31/2010  T:  09/01/2010  Job:  [id number]  Electronically Signed by Scheryl Darter MD on 09/15/2010 11:34:58 AM

## 2010-09-16 LAB — GC/CHLAMYDIA PROBE AMP, GENITAL
Chlamydia, DNA Probe: NEGATIVE
GC Probe Amp, Genital: NEGATIVE

## 2010-09-16 LAB — WET PREP, GENITAL: Yeast Wet Prep HPF POC: NONE SEEN

## 2010-09-16 LAB — CBC
HCT: 35.6 % — ABNORMAL LOW (ref 36.0–46.0)
Hemoglobin: 11.9 g/dL — ABNORMAL LOW (ref 12.0–15.0)
MCHC: 33.4 g/dL (ref 30.0–36.0)
MCV: 93.9 fL (ref 78.0–100.0)
RBC: 3.79 MIL/uL — ABNORMAL LOW (ref 3.87–5.11)

## 2010-09-18 LAB — URINALYSIS, ROUTINE W REFLEX MICROSCOPIC
Glucose, UA: NEGATIVE mg/dL
Ketones, ur: NEGATIVE mg/dL
Leukocytes, UA: NEGATIVE
Protein, ur: NEGATIVE mg/dL

## 2010-09-18 LAB — WET PREP, GENITAL
Clue Cells Wet Prep HPF POC: NONE SEEN
Trich, Wet Prep: NONE SEEN
Yeast Wet Prep HPF POC: NONE SEEN

## 2010-09-18 LAB — GC/CHLAMYDIA PROBE AMP, GENITAL
Chlamydia, DNA Probe: NEGATIVE
GC Probe Amp, Genital: NEGATIVE

## 2010-09-18 LAB — URINE MICROSCOPIC-ADD ON

## 2010-09-18 LAB — POCT PREGNANCY, URINE: Preg Test, Ur: NEGATIVE

## 2010-09-22 LAB — WET PREP, GENITAL
Trich, Wet Prep: NONE SEEN
Yeast Wet Prep HPF POC: NONE SEEN

## 2010-09-22 LAB — URINALYSIS, ROUTINE W REFLEX MICROSCOPIC
Bilirubin Urine: NEGATIVE
Glucose, UA: NEGATIVE mg/dL
Ketones, ur: NEGATIVE mg/dL
Protein, ur: NEGATIVE mg/dL
Urobilinogen, UA: 0.2 mg/dL (ref 0.0–1.0)

## 2010-09-22 LAB — URINE MICROSCOPIC-ADD ON

## 2010-09-22 LAB — POCT PREGNANCY, URINE: Preg Test, Ur: NEGATIVE

## 2010-09-22 LAB — GC/CHLAMYDIA PROBE AMP, GENITAL: GC Probe Amp, Genital: NEGATIVE

## 2010-10-28 NOTE — Discharge Summary (Signed)
NAMEBENNY, [name]               ACCOUNT NO.:  [id number]   MEDICAL RECORD NO.:  [id number]          PATIENT TYPE:  INP   LOCATION:  9310                          [facility]   PHYSICIAN:  Norton Blizzard, MD    DATE OF BIRTH:  [birth year]   DATE OF ADMISSION:  11/06/2007  DATE OF DISCHARGE:  11/09/2007                               DISCHARGE SUMMARY   ADMISSION DIAGNOSES:  1. Intrauterine pregnancy at 7-4/7 weeks' gestation.  2. Hyperemesis gravidarum.   DISCHARGE DIAGNOSES:  1. Intrauterine pregnancy at 41 weeks' gestational age.  2. Hyperemesis gravidarum.   PROCEDURES:  None.   PERTINENT LABORATORY STUDIES:  On admission, the patient was noted to  have a potassium of 3.7, ALT of 113, AST of 70, creatinine of 0.96,  amylase of 194, lipase 31, TSH of 0.203, but normal T3 and T4, T3 was  2.7 and T4 1.07.  H. pylori antibody was negative.  At the time of  discharge, the patient's potassium was stable at 3.75, her ALT was down  to 94 and AST was 41, and creatinine was 0.87.   RADIOLOGY:  The patient had an OB ultrasound on Nov 06, 2007, which  showed single living intrauterine pregnancy, and this was consistent  with a possible vanishing twin gestation given that double yolk sacs  were seen on a prior ultrasound that was done around 4 weeks' gestation.  The patient also had a right upper quadrant scan, which was normal.   HOSPITAL COURSE:  The patient is a 25 year old G4, P 1-0-2-1, who  presented at 7 weeks and 4 days with hyperemesis gravidarum.  The  patient was admitted for observation and treated with initially  intravenous antiemetics, which were switched over to oral antiemetics by  hospital day #3.  She had the laboratory values as previously noted and  also had the ultrasound, which was negative for any abnormalities with  her liver or gallbladder.  Of note, on admission, the patient was noted  to weigh 208 pounds, and this went up to 213 pounds by discharge.  The  patient was seen by a nutritional consult and was doing well as per the  nutritional consult.  By hospital day #3, the patient was tolerating a  small amount of regular diet and fluid, and had minimal nausea and  emesis.  The patient was deemed stable for discharge to home.   DISCHARGE MEDICATIONS:  The patient was told to continue her Phenergan  and Zofran as previously prescribed.  She was also given a prescription  for Robinul Forte 1-2 mg p.o. t.i.d. p.r.n. nausea.   DISCHARGE INSTRUCTIONS:  The patient was told to call or return to the  MAU with any concerning symptoms or if she is unable to tolerate any  food or liquids.  She was told that when she comes back the next step if  she does  fail a trial of antiemetics would be a steroid taper and a possible  nasogastric tube placement.  She does understand this plan.  The patient  is followed at [facility] and has an  appointment at [facility]  with Dr. Lequita Asal on November 24, 2007.  Of note, she also has an  appointment on November 21, 2007, for a followup ultrasound.      Norton Blizzard, MD  Electronically Signed     UAD/MEDQ  D:  11/09/2007  T:  11/10/2007  Job:  [id number]

## 2010-10-28 NOTE — Discharge Summary (Signed)
[name], [name]               ACCOUNT NO.:  [id number]   MEDICAL RECORD NO.:  [id number]          PATIENT TYPE:  INP   LOCATION:  9308                          [facility]   PHYSICIAN:  Ginger Carne, MD  DATE OF BIRTH:  23-May-1986   DATE OF ADMISSION:  11/15/2007  DATE OF DISCHARGE:  11/22/2007                               DISCHARGE SUMMARY   DISCHARGE DIAGNOSES:  1. Intrauterine pregnancy at 9 weeks and 6 days gestational age.  2. Hyperemesis gravidarum.   PROCEDURES:  The patient is status post Zofran pump placement and  feeding tube placement on November 17, 2007.   PERTINENT LABS:  On admission, the patient had a urinalysis with 100 of  glucose, moderate bili, greater than 80 ketones, moderate blood, protein  of 100, urobilinogen of 2.0, many bacteria.  Hemoglobin 12.6 and  hematocrit 36.5.  Sodium 133, potassium 3.6, AST 46, ALT 69, and albumin  3.9.   DISCHARGE LABS:  Sodium 132, potassium 4.0, glucose 101, BUN 4,  creatinine 0.72, AST 18, ALT 92, and albumin 3.1.   RADIOGRAPHIC STUDIES:  On November 16, 2007, the patient had an ultrasound  done which showed a single living intrauterine pregnancy and near  complete resolution of previously seen hypoechoic area in uterus.   HOSPITAL COURSE:  The patient is a 25 year old G4, P1-0-2-1 who  presented at 8 weeks and 6 days gestation with hyperemesis.  She has  recently been seen in discharge for the same diagnosis on Nov 09, 2007.  She is admitted for observation treated initially with IV antiemetics  and IV fluids.  The patient did not seem to be tolerating this well and  had a Dobbhoff tube placed.  Two Jevity feeds were initiated; however,  the patient subsequently vomited up her feeding tube and decision made  not to replace it.  By hospital day #6, the patient had continued to be  fairly unable to tolerate p.o. and not had significant weight gain;  therefore, a Zofran pump was ordered.  This was placed by [facility] and the patient reported significant improvement in her  nausea and vomiting and was able to tolerate a normal diet p.o.  The  patient's lab values were as noted, and as in the past, she had elevated  transaminases without any evidence of acute hepatitis or gallbladder  inflammation.  It resolved or trended down as her nausea and vomiting  improved.  The patient's weight on admission was 198 pounds and by the  day of discharge had increased to approximately 204 pounds.  By the day  of discharge, the patient was tolerating a regular diet and had minimal  nausea and vomiting.  She is being stable for discharge home with follow  up with [facility] for Zofran pump management.   DISCHARGE MEDICATIONS:  1. Zofran pump 1 mg per hour.  The patient may bolus 0.5 mg every 3      hours as needed.  2. Phenergan 25 mg suppository per rectum, q.4-6 h. as needed.  3. Medrol taper until November 28, 2007.   DISCHARGE INSTRUCTIONS:  The patient has been counseled that should she  begin to have any difficulty with nausea and vomiting or such that she  is unable to tolerate significant oral fluids, she is to either call the  family practice center or return to the MAU for IV fluid and IV  antiemetics in order to potentially prevent necessity of further  hospitalizations.  The patient is also instructed to advance her diet  slowly with bland foods and liquids at  first, and to progress to a more normal diet as she is able to tolerate.  The patient has been given a note for the job she missed at work and is  to follow up with Dr. Lequita Asal at [facility] on  November 24, 2007, at 10:30.      Lauro Franklin, MD      Ginger Carne, MD  Electronically Signed    TCB/MEDQ  D:  11/22/2007  T:  11/22/2007  Job:  [id number]

## 2010-10-28 NOTE — Discharge Summary (Signed)
[name], [name]               ACCOUNT NO.:  [id number]   MEDICAL RECORD NO.:  [id number]          PATIENT TYPE:  INP   LOCATION:  9155                          [facility]   PHYSICIAN:  Norton Blizzard, MD    DATE OF BIRTH:  01-Jan-1986   DATE OF ADMISSION:  05/21/2008  DATE OF DISCHARGE:  05/23/2008                               DISCHARGE SUMMARY   DISCHARGE DIAGNOSES:  1. Intrauterine pregnancy at 47 weeks' gestational age.  2. Intrauterine growth restriction.  3. History of preeclampsia, previous pregnancy.  4. Intermittently elevated blood pressures.   REASON FOR ADMISSION:  Mrs. [name] is a 25 year old, gravida 4,  para 1-0-2-1, who was admitted 35-5/7th weeks' gestational age after an  evaluation from an elevated blood pressure on a clinic visit revealed an  IUGR, the estimated fetal weight less than 10%.  Further evaluation  including PIH labs were negative; however, given IUGR and the patient's  report of intermittent headaches, the patient was admitted for  observation and 24-hour urine collection.   HOSPITAL COURSE:  During the patient's hospital course, 24-hour urine  was collected which revealed a total protein of 75.  Her blood pressures  on serial monitoring and serial collections were essentially all normal  except for one at the very beginning of her admission which was 142/73.  She had continuous fetal monitoring during her stay, and the fetal heart  tracing was consistently reactive with baseline weight of 130 with  regular accelerations greater than 15 beats per minute, moderate  variability, and no decelerations.  The patient does have this  consistently intermittent headaches with reported scotoma.  However, she  does not have any epigastric pain.  After further discussion with the  service given her actually normal blood pressures and normal 24-hour  protein, decision was made to discharge the patient to home with close  monitoring and followup.   The patient will have NST, AFI, and Dopplers  in Internal Fetal Medicine on Friday May 25, 2008.  She will have a  followup appointment in the [facility] on Monday, May 28, 2008, and then plan is for induction on Wednesday, May 30, 2008,  when the patient is 25 weeks' gestational age.   PROCEDURES:  None.   FINDINGS:  As reported in the hospital course.   CONDITION AT DISCHARGE:  Good.   INSTRUCTIONS TO THE PATIENT:  Followup appointments as related in the  hospital course.  There were no medications prescribed.  The patient  will continue her prenatal vitamins.  The above instructions were  discussed with the patient who voiced understanding and all her  questions were answered.      Odie Sera, DO  Electronically Signed     ______________________________  Norton Blizzard, MD    MC/MEDQ  D:  05/23/2008  T:  05/23/2008  Job:  [id number]

## 2010-10-28 NOTE — Discharge Summary (Signed)
[name], ROADCAP               ACCOUNT NO.:  [id number]   MEDICAL RECORD NO.:  [id number]          PATIENT TYPE:  INP   LOCATION:  9156                          [facility]   PHYSICIAN:  Lazaro Arms, M.D.   DATE OF BIRTH:  February 28, 1986   DATE OF ADMISSION:  04/27/2008  DATE OF DISCHARGE:  04/29/2008                               DISCHARGE SUMMARY   ADMITTING DIAGNOSIS:  Pregnancy at 32 weeks and 2 days with  hypertension, rule out preeclampsia.   DISCHARGE DIAGNOSIS:  Headaches, normotensive.   HOSPITAL COURSE:  Has been uneventful.  The patient's blood pressures  have stabilized to 110-120/60-70.  Total 24-hour urine protein is 120.  Darvocet was given to the patient, seems to be working well for her  headaches.   PLAN:  In review, her vital signs were stable and her 24-hour urine  total protein is 120.  I am going to discharge her home to follow up in  office in 1 week, and gave her Darvocet-N 100 one to two p.o. q.4 h.  p.r.n. headache.  She should notify us if any problems or call the  office.      Zerita Boers, N.M.      Lazaro Arms, M.D.  Electronically Signed    DL/MEDQ  D:  86/57/8469  T:  04/30/2008  Job:  [id number]   cc:   Lazaro Arms, M.D.  Fax: [phone]

## 2010-10-31 NOTE — Discharge Summary (Signed)
[name], [name]                         ACCOUNT NO.:  [id number]   MEDICAL RECORD NO.:  [id number]                   PATIENT TYPE:  INP   LOCATION:  9302                                 [facility]   PHYSICIAN:  Lorne Skeens, D.O.                   DATE OF BIRTH:  [birth year]   DATE OF ADMISSION:  11/22/2002  DATE OF DISCHARGE:                                 DISCHARGE SUMMARY   DISCHARGE DIAGNOSES:  1. Pregnancy, currently 8 weeks and 4 days estimated gestational age.  2. Hyperemesis gravidarum.  3. Dehydration.   DISCHARGE MEDICATIONS:  Include:  1. Phenergan 12.5 mg both p.r. and p.o. q. 4h p.r.n. nausea.  2. Prenatal vitamins one tablet p.o. daily.   HOSPITAL COURSE:  [name] is an 25 year old African-American female,  currently G3, P0-0-2-0, that presented at 8 weeks and 3 days estimated  gestational age with hyperemesis and dehydration.  She had been seen in the  clinic for this problem and given prescription for p.o. Phenergan to use  p.r.n. for nausea.  She was unable to hold the pills down and continued to  have multiple episodes of nausea and vomiting.  The patient was seen in MAU  on November 14, 2002 for similar symptoms and given I.V. fluids and sent home  with p.o. Phenergan.   EXAM:  She was afebrile with a normal pulse of 87 and blood pressure of  127/80.  She had positive orthostatic blood pressures.  ABDOMEN:  Soft with normoactive bowel sounds, tender to palpation in the  epigastric area and right upper quadrant.  Speculum exam revealed white  discharge and uterus was about 8 weeks in size.  She had no lesions on the  external genitalia.  Cervix was long and closed.   LABS:  CBC showed a white count of 7.3.  Platelet count of 379.  Hemoglobin  12.1.  Hematocrit 35.4.  Her urinalysis on admission showed a specific  gravity greater than 1.30, small amount of blood, small bilirubin.  Ketones  40.  Protein 30.  Her H pylori was negative.  Her wet probe  showed moderate  clue cells, moderate white blood cells, and many bacteria.  Her amylase was  280 and lipase 43.  On recheck, lipase went up to 55.  Her CMET showed AST  of 54 and an ALT of 66.   For her hyperemesis with epigastric and right upper quadrant pain, the  patient was given I.V. fluid rehydration, antiemetics, and a gallbladder  ultrasound was performed showing biliary sludge.  There was no biliary duct  dilatation or stones visualized.  The patient was admitted for approximately  24 hours with I.V. fluid rehydration and repeat orthostatics are negative.  The patient has been able to hold down both clear and solid food.  Has no  complaints of weakness, dizziness, fevers, or chills.   Followup appointment is  to be with Dr. Alvira Philips on November 28, 2002 at  2:15 p.m.  She likely needs a followup appointment with surgery to discuss  biliary sludge of symptoms continue and labs do not improve.                                               Lorne Skeens, D.O.    KL/MEDQ  D:  11/24/2002  T:  11/24/2002  Job:  [id number]   cc:   Alvira Philips, M.D.  [facility]  Family Prac. Resident  [place]  [place] [address]  Fax: [phone]

## 2010-10-31 NOTE — Discharge Summary (Signed)
[name], [name]                         ACCOUNT NO.:  [id number]   MEDICAL RECORD NO.:  [id number]                   PATIENT TYPE:  INP   LOCATION:  9105                                 [facility]   PHYSICIAN:  Burnadette Peter, M.D.             DATE OF BIRTH:  1985-09-25   DATE OF ADMISSION:  12/02/2002  DATE OF DISCHARGE:  12/05/2002                                 DISCHARGE SUMMARY   DISCHARGE DIAGNOSES:  1. Hyperemesis gravidarum.  2. Urinary tract infection.  3. Dehydration.  4. First trimester pregnancy.   DISCHARGE MEDICATIONS:  1. Phenergan 25 mg suppositories one p.r. q.6h. p.r.n.  2. Macrobid 100 mg SR one tablet p.o. b.i.d. x7 days.   DISCHARGE INSTRUCTIONS:  The patient is encouraged to drink small amounts of  liquid throughout the day and stick to a bland diet.   HOSPITAL COURSE:  An 25 year old G3, P0-0-2-0 presented at approximately [redacted]  weeks gestation with persistent nausea and vomiting.  She had had numerous  visits to the MAU and a recent hospitalization for similar complaints of  nausea and vomiting and by laboratory work done at her recent admission this  month was found to have elevated liver enzymes and biliary sludge on a right  upper quadrant ultrasound.  She had been back to see her primary M.D. and  was to see a surgeon possibly in the second trimester for surgery.  She had  been taking Phenergan and Zofran at home without help with her nausea.  On  admission she was slightly tachycardic with a pulse of 102.  She was  afebrile and blood pressures were mildly elevated at 148/76.  She appeared  dehydrated on presentation.  Her abdomen was soft and nontender.  She had  mild left CVA tenderness.  Laboratory work showed a white blood cell count  5.2.  Her urine showed 100 glucose, small hemoglobin, large bilirubin, 40  ketones, and positive nitrite.  Her CMET showed an increase in SGOT at 89  and ALT of 110.  Her amylase and lipase were  obtained.  Lipase was normal.  Amylase is elevated same as her last presentation.  From June 13 GC and  Chlamydia were both negative.  An informal ultrasound showed an intrauterine  pregnancy with positive cardiac and fetal movement.  The patient was  admitted for persistent nausea and vomiting, untreated UTI, IV fluid  rehydration, and IV antibiotics.  His urine was sent to culture and grew out  multiple contaminants.  The patient was placed on IV cefotetan and is  currently on day three.  She had no fevers during her hospitalization.  Currently, has no CVA tenderness or abdominal pain or complaints of dysuria  or hematuria.  She received D5 LR at 150 mL/hour which was decreased each  hospital day.  She has been able to hold down food for greater than 48 hours  with the  assistance of antiemetics.  She was also seen by the nutritionist  who suggested changing to a bland diet, snacks t.i.d., push clear fluids,  consider multivitamins and IV fluids.  However, she was able to hold down  her prenatal vitamins during her stay.  On discharge day patient does appear  more comfortable.  Appears to be well hydrated.  Is comfortable and sleeping  well and intermittent Dopplers on the fetal heart rate are normal.  Her  blood pressure also came down to a normal range of 114/40 during her  admission.   FOLLOWUP ON ADMISSION:  1. Keeping regular follow-up appointments with her primary OB.  2. Request surgery consult in the future for biliary sludge and persistent     vomiting.  3.     Recommend slow p.o. hydration and a bland diet, possibly adding a PPI in the     future.  4. Compliance with antibiotics as patient was given antibiotics on June 17     in MAU for UTI and did not comply with taking these.  She should complete     seven days of Macrobid at discharge.     [name], D.O.                         Burnadette Peter, M.D.    [name]  D:  12/05/2002  T:  12/05/2002  Job:  [id number]    cc:   Alvira Philips, M.D.  [facility].  Family Prac. Resident  [place]  [place] [address]  Fax: [phone]

## 2010-10-31 NOTE — Discharge Summary (Signed)
[name], [name]                         ACCOUNT NO.:  [id number]   MEDICAL RECORD NO.:  [id number]                   PATIENT TYPE:  INP   LOCATION:  9167                                 [facility]   PHYSICIAN:  Alvira Philips, M.D.                DATE OF BIRTH:  01-Feb-1986   DATE OF ADMISSION:  06/02/2003  DATE OF DISCHARGE:  06/06/2003                                 DISCHARGE SUMMARY   DISCHARGE DIAGNOSES:  1. Intrauterine pregnancy.  2. Intrauterine growth retardation.  3. Transient hypertension.  4. Failed induction.   BRIEF HOSPITAL COURSE:  This is a 25 year old, G3, P0-0-2-0, who presented  at 35-5/7 weeks complaining of contractions, blurred vision, and headaches.  She said that she still noted good fetal activity, denied any vaginal  bleeding, denied any loss of fluids. She stated that these symptoms started  this morning and have gotten closer together, and she notes nausea and  vomiting.  On physical examination she was noted on digital cervix exam of  2+ and thick. Fetal heart rate in the 140s with average long-term  variabilities with positive accelerations occasionally, and some mild  variable decelerations. On ultrasound it was noted that estimated fetal  weight was 1976 gm which was less than the 5th percentile and the abdomen  circumference likened about 4 weeks was consistent with asymmetric IUGR. The  patient was admitted and had blood pressure of 140/82. Preeclamptic labs  were negative. The patient was admitted for IUGR for induction of labor.  The patient's blood pressure slowly returned to normal throughout the  hospital course.  The patient was brought in for induction of labor  secondary to IUGR and normal AFI. The patient was started on Cervidil for  cervical ripening and received several Cervidil placements over the next 24  to 48 hours. All in all the patient received three Cervidils before being  switched over to Cytotec 25 mcg times four.   The patient's ultrasound and  dates were reviewed. The patient showed appropriate growth on a 33 week and  now at 36 weeks with less than 50th percentile on [facility] and BPD for less than  one week difference, but growth at 33 weeks. The patient's cervix, after  receiving Cytotec times four was only 2 cm, 60, 75%, soft, and -3. The  patient's preeclamptic labs remained negative throughout hospital course.  The patient then underwent a repeat ultrasound that showed estimated fetal  weight of 2490 gm at 37 weeks, AFI of 11.6, AC 35 weeks and 0 days, FL 34  weeks and 4 days, HC 34 weeks and 2 days, mean gestational age of [redacted] weeks  and 5 days. UA Doppler S to D ratio of 3.44 and MCA PI of 1.82. These were  reassuring now based on fetal biometry and appropriate dating. At this point  in time we are considering all measures are now more congruently delayed  approximately two weeks, consider failed induction. The patient will  discharged on June 06, 2003. She will get an NST in two days to re-  evaluate and monitor fetal well being. The patient will return in  approximately five days for repeat induction at that point in time. The  patient was given labor precautions at the time of discharge. The patient  verbalized and agreed with assessment and plan. The case was discussed with  Dr. Elinor Dodge.                                               Alvira Philips, M.D.    RM/MEDQ  D:  08/27/2003  T:  08/28/2003  Job:  [id number]

## 2011-01-29 ENCOUNTER — Encounter (HOSPITAL_COMMUNITY): Payer: Self-pay

## 2011-01-29 ENCOUNTER — Inpatient Hospital Stay (HOSPITAL_COMMUNITY): Payer: Self-pay

## 2011-01-29 ENCOUNTER — Inpatient Hospital Stay (HOSPITAL_COMMUNITY)
Admission: AD | Admit: 2011-01-29 | Discharge: 2011-01-29 | Disposition: A | Discharge status: Home / Self Care | Payer: Self-pay | Source: Ambulatory Visit | Attending: Obstetrics and Gynecology | Admitting: Obstetrics and Gynecology

## 2011-01-29 DIAGNOSIS — N912 Amenorrhea, unspecified: Secondary | ICD-10-CM | POA: Insufficient documentation

## 2011-01-29 DIAGNOSIS — N910 Primary amenorrhea: Secondary | ICD-10-CM

## 2011-01-29 DIAGNOSIS — N925 Other specified irregular menstruation: Secondary | ICD-10-CM

## 2011-01-29 DIAGNOSIS — R102 Pelvic and perineal pain: Secondary | ICD-10-CM

## 2011-01-29 DIAGNOSIS — N949 Unspecified condition associated with female genital organs and menstrual cycle: Secondary | ICD-10-CM

## 2011-01-29 LAB — WET PREP, GENITAL
Trich, Wet Prep: NONE SEEN
Yeast Wet Prep HPF POC: NONE SEEN

## 2011-01-29 LAB — POCT PREGNANCY, URINE: Preg Test, Ur: NEGATIVE

## 2011-01-29 LAB — URINALYSIS, ROUTINE W REFLEX MICROSCOPIC
Glucose, UA: NEGATIVE mg/dL
Ketones, ur: NEGATIVE mg/dL
Leukocytes, UA: NEGATIVE
Protein, ur: NEGATIVE mg/dL
pH: 6 (ref 5.0–8.0)

## 2011-01-29 LAB — URINE MICROSCOPIC-ADD ON

## 2011-01-29 MED ORDER — MEDROXYPROGESTERONE ACETATE 10 MG PO TABS: 10.0000 mg | ORAL_TABLET | Freq: Every day | ORAL | Status: DC

## 2011-01-29 MED ORDER — KETOROLAC TROMETHAMINE 60 MG/2ML IM SOLN
60.0000 mg | Freq: Once | INTRAMUSCULAR | Status: AC
  Administered 2011-01-29: 60 mg via INTRAMUSCULAR
  Filled 2011-01-29: qty 2

## 2011-01-29 NOTE — Progress Notes (Signed)
Onset of abdominal pain x 1 week with vaginal discharge, had tubal ligation 4 months ago, no period in 3 months, has ?lump on abdomen, vaginal discharge no odor

## 2011-01-29 NOTE — ED Provider Notes (Signed)
History     Chief Complaint  Patient presents with  . Abdominal Pain  . Vaginal Discharge   HPI 25 y.o. 4 months s/p BTL following SVD of twins. Cramping pain x 1 week, little discharge, no period x 3 months, hasn't tried anything for pain. No n/v, constipation, diarrhea.    No past medical history on file.  No past surgical history on file.  No family history on file.  History  Substance Use Topics  . Smoking status: Not on file  . Smokeless tobacco: Not on file  . Alcohol Use: Not on file    Allergies: No Known Allergies  No prescriptions prior to admission    Review of Systems  Constitutional: Negative.   Respiratory: Negative.   Cardiovascular: Negative.   Gastrointestinal: Positive for abdominal pain. Negative for nausea, vomiting, diarrhea and constipation.  Genitourinary: Negative for dysuria, urgency, frequency, hematuria and flank pain.       Negative for vaginal bleeding, positive for vaginal discharge  Musculoskeletal: Negative.   Neurological: Negative.   Psychiatric/Behavioral: Negative.    Physical Exam   Blood pressure 122/78, pulse 63, temperature 99 F (37.2 C), temperature source Oral, resp. rate 16, height 5\' 4"  (1.626 m), weight 106.505 kg (234 lb 12.8 oz), not currently breastfeeding.  Physical Exam  Constitutional: She is oriented to person, place, and time. She appears well-developed and well-nourished. No distress.  HENT:  Head: Normocephalic and atraumatic.  Cardiovascular: Normal rate, regular rhythm and normal heart sounds.   Respiratory: Effort normal and breath sounds normal. No respiratory distress.  GI: Soft. Bowel sounds are normal. She exhibits no distension and no mass. There is no tenderness. There is no rebound and no guarding.  Genitourinary: There is no rash or lesion on the right labia. There is no rash or lesion on the left labia. Uterus is not deviated, not enlarged, not fixed and not tender. Cervix exhibits no motion  tenderness, no discharge and no friability. Right adnexum displays tenderness. Right adnexum displays no mass and no fullness. Left adnexum displays no mass, no tenderness and no fullness. No erythema, tenderness or bleeding around the vagina. No vaginal discharge found.  Neurological: She is alert and oriented to person, place, and time.  Skin: Skin is warm and dry.  Psychiatric: She has a normal mood and affect.    MAU Course  Procedures    Assessment and Plan  U/S ordered Toradol for pain Care assumed by E. Rice, PA  FRAZIER,NATALIE 01/29/2011, 7:48 AM

## 2011-01-29 NOTE — ED Provider Notes (Signed)
History   I accepted care of this pt from Georges Mouse, CNM.  Chief Complaint  Patient presents with  . Abdominal Pain  . Vaginal Discharge   HPI  OB History    Grav Para Term Preterm Abortions TAB SAB Ect Mult Living   1               No past medical history on file.  No past surgical history on file.  No family history on file.  History  Substance Use Topics  . Smoking status: Not on file  . Smokeless tobacco: Not on file  . Alcohol Use: Not on file    Allergies: No Known Allergies  No prescriptions prior to admission    ROS Physical Exam   Blood pressure 122/78, pulse 63, temperature 99 F (37.2 C), temperature source Oral, resp. rate 16, height 5\' 4"  (1.626 m), weight 234 lb 12.8 oz (106.505 kg), not currently breastfeeding.  Physical Exam  MAU Course  Procedures  Results for orders placed during the hospital encounter of 01/29/11 (from the past 24 hour(s))  URINALYSIS, ROUTINE W REFLEX MICROSCOPIC     Status: Abnormal   Collection Time   01/29/11  7:30 AM      Component Value Range   Color, Urine YELLOW  YELLOW    Appearance CLEAR  CLEAR    Specific Gravity, Urine 1.020  1.005 - 1.030    pH 6.0  5.0 - 8.0    Glucose, UA NEGATIVE  NEGATIVE (mg/dL)   Hgb urine dipstick SMALL (*) NEGATIVE    Bilirubin Urine NEGATIVE  NEGATIVE    Ketones, ur NEGATIVE  NEGATIVE (mg/dL)   Protein, ur NEGATIVE  NEGATIVE (mg/dL)   Urobilinogen, UA 0.2  0.0 - 1.0 (mg/dL)   Nitrite NEGATIVE  NEGATIVE    Leukocytes, UA NEGATIVE  NEGATIVE   URINE MICROSCOPIC-ADD ON     Status: Abnormal   Collection Time   01/29/11  7:30 AM      Component Value Range   Squamous Epithelial / LPF FEW (*) RARE    RBC / HPF 0-2  <3 (RBC/hpf)  POCT PREGNANCY, URINE     Status: Normal   Collection Time   01/29/11  7:41 AM      Component Value Range   Preg Test, Ur NEGATIVE    WET PREP, GENITAL     Status: Abnormal   Collection Time   01/29/11  7:50 AM      Component Value Range   Yeast,  Wet Prep NONE SEEN  NONE SEEN    Trich, Wet Prep NONE SEEN  NONE SEEN    Clue Cells, Wet Prep NONE SEEN  NONE SEEN    WBC, Wet Prep HPF POC FEW (*) NONE SEEN    US Transvaginal Non-ob  01/29/2011  *RADIOLOGY REPORT*  Clinical Data: Pelvic pain and right adnexal tenderness. TAB x 3 and BTL.  LMP 11/07/2010  TRANSABDOMINAL AND TRANSVAGINAL ULTRASOUND OF PELVIS Technique:  Both transabdominal and transvaginal ultrasound examinations of the pelvis were performed. Transabdominal technique was performed for global imaging of the pelvis including uterus, ovaries, adnexal regions, and pelvic cul-de-sac.  Comparison: None.   It was necessary to proceed with endovaginal exam following the transabdominal exam to visualize the endometrium and ovaries.  Findings:  Uterus: Demonstrates a sagittal length of 9.5 cm, AP depth of 4.3 cm and a transverse width of 5.5 cm.  The myometrium appears mildly heterogeneous.  Endometrium: Is echogenic with an AP width  of 12.3 mm.  A poorly defined endometrial myometrial interface and junctional zone are seen and this may indicate underlying adenomyosis or may be the result of prior instrumentation given the history of TAB x3  Right ovary:  Measures 2.9 x 2.2 x 1.4 cm and has a normal appearance  Left ovary: Measures 4.2 x 3.4 x 4.7 cm and contains a unilocular thin-walled simple cyst measuring 3.8 x 2.8 x 4.2 cm.  Other findings: A trace of simple free fluid is noted in the cul-de- sac.  No separate adnexal masses are seen.  IMPRESSION: Mildly heterogeneous uterine myometrium and poor endometrial myometrial interface with a poorly visualized junctional zone may indicate underlying adenomyosis.  The poorly visualized endometrial myometrial interface may also be the result of repeated episodes of instrumentation.  Benign simple appearing left ovarian cyst.  This is likely functional in nature but given the patient's history of pelvic pain, reassessment in the immediate postsecretory phase  of the cycle would be recommended to confirm resolution of this finding. Reassessment of the endometrial lining can also be undertaken at that time.  Original Report Authenticated By: Bertha Stakes, M.D.   US Pelvis Complete  01/29/2011  *RADIOLOGY REPORT*  Clinical Data: Pelvic pain and right adnexal tenderness. TAB x 3 and BTL.  LMP 11/07/2010  TRANSABDOMINAL AND TRANSVAGINAL ULTRASOUND OF PELVIS Technique:  Both transabdominal and transvaginal ultrasound examinations of the pelvis were performed. Transabdominal technique was performed for global imaging of the pelvis including uterus, ovaries, adnexal regions, and pelvic cul-de-sac.  Comparison: None.   It was necessary to proceed with endovaginal exam following the transabdominal exam to visualize the endometrium and ovaries.  Findings:  Uterus: Demonstrates a sagittal length of 9.5 cm, AP depth of 4.3 cm and a transverse width of 5.5 cm.  The myometrium appears mildly heterogeneous.  Endometrium: Is echogenic with an AP width of 12.3 mm.  A poorly defined endometrial myometrial interface and junctional zone are seen and this may indicate underlying adenomyosis or may be the result of prior instrumentation given the history of TAB x3  Right ovary:  Measures 2.9 x 2.2 x 1.4 cm and has a normal appearance  Left ovary: Measures 4.2 x 3.4 x 4.7 cm and contains a unilocular thin-walled simple cyst measuring 3.8 x 2.8 x 4.2 cm.  Other findings: A trace of simple free fluid is noted in the cul-de- sac.  No separate adnexal masses are seen.  IMPRESSION: Mildly heterogeneous uterine myometrium and poor endometrial myometrial interface with a poorly visualized junctional zone may indicate underlying adenomyosis.  The poorly visualized endometrial myometrial interface may also be the result of repeated episodes of instrumentation.  Benign simple appearing left ovarian cyst.  This is likely functional in nature but given the patient's history of pelvic pain,  reassessment in the immediate postsecretory phase of the cycle would be recommended to confirm resolution of this finding. Reassessment of the endometrial lining can also be undertaken at that time.  Original Report Authenticated By: Bertha Stakes, M.D.    Assessment and Plan  Pelvic pain/delayed menses: discussed with pt at length. Will start progesterone challenge. Will give Rx for provera. She will f/u in the GYN clinic. Discussed diet, activity, risks, and precautions.  Clinton Gallant. Aura Bibby III, DrHSc, MPAS, PA-C  01/29/2011, 9:11 AM

## 2011-01-30 LAB — GC/CHLAMYDIA PROBE AMP, GENITAL: Chlamydia, DNA Probe: NEGATIVE

## 2011-02-27 ENCOUNTER — Encounter: Payer: Medicaid Other | Admitting: Obstetrics and Gynecology

## 2011-03-04 LAB — CBC
Hemoglobin: 11.8 — ABNORMAL LOW
RBC: 3.73 — ABNORMAL LOW
WBC: 7.2

## 2011-03-04 LAB — WET PREP, GENITAL

## 2011-03-05 LAB — POCT PREGNANCY, URINE
Operator id: 202651
Preg Test, Ur: NEGATIVE

## 2011-03-05 LAB — CBC
HCT: 34.5 — ABNORMAL LOW
MCV: 92.3
Platelets: 382
RDW: 12.2
WBC: 3.9 — ABNORMAL LOW

## 2011-03-05 LAB — WET PREP, GENITAL
Trich, Wet Prep: NONE SEEN
Yeast Wet Prep HPF POC: NONE SEEN

## 2011-03-05 LAB — GC/CHLAMYDIA PROBE AMP, GENITAL
Chlamydia, DNA Probe: NEGATIVE
GC Probe Amp, Genital: NEGATIVE

## 2011-03-09 LAB — URINALYSIS, ROUTINE W REFLEX MICROSCOPIC
Bilirubin Urine: NEGATIVE
Ketones, ur: NEGATIVE
Leukocytes, UA: NEGATIVE
Nitrite: NEGATIVE
Urobilinogen, UA: 0.2
pH: 6

## 2011-03-09 LAB — CBC
HCT: 33.9 — ABNORMAL LOW
Hemoglobin: 11.9 — ABNORMAL LOW
MCV: 91.3
RBC: 3.72 — ABNORMAL LOW
WBC: 4.7

## 2011-03-09 LAB — POCT PREGNANCY, URINE: Operator id: 22333

## 2011-03-10 LAB — WET PREP, GENITAL
Clue Cells Wet Prep HPF POC: NONE SEEN
Trich, Wet Prep: NONE SEEN

## 2011-03-10 LAB — CBC
HCT: 37.6
Hemoglobin: 12.8
MCHC: 33.9
MCV: 92.8
RBC: 4.05
RDW: 12.5

## 2011-03-10 LAB — GC/CHLAMYDIA PROBE AMP, GENITAL: GC Probe Amp, Genital: NEGATIVE

## 2011-03-11 LAB — COMPREHENSIVE METABOLIC PANEL
ALT: 136 — ABNORMAL HIGH
ALT: 83 — ABNORMAL HIGH
AST: 41 — ABNORMAL HIGH
AST: 46 — ABNORMAL HIGH
Albumin: 3.9
Alkaline Phosphatase: 40
Alkaline Phosphatase: 46
BUN: 3 — ABNORMAL LOW
BUN: 6
CO2: 24
CO2: 25
CO2: 27
Calcium: 8.7
Calcium: 9
Calcium: 9.7
Chloride: 102
Chloride: 107
Creatinine, Ser: 0.87
Creatinine, Ser: 0.96
GFR calc Af Amer: >60
GFR calc non Af Amer: >60
GFR calc non Af Amer: >60
GFR calc non Af Amer: >60
Glucose, Bld: 87
Potassium: 3 — ABNORMAL LOW
Potassium: 3.6
Sodium: 135
Sodium: 135
Total Bilirubin: 0.8
Total Bilirubin: 1.2
Total Protein: 7.5

## 2011-03-11 LAB — URINALYSIS, ROUTINE W REFLEX MICROSCOPIC
Bilirubin Urine: NEGATIVE
Ketones, ur: NEGATIVE
Nitrite: NEGATIVE
Nitrite: NEGATIVE
Protein, ur: NEGATIVE
Specific Gravity, Urine: >1.03 — ABNORMAL HIGH
Urobilinogen, UA: 2 — ABNORMAL HIGH
pH: 5.5
pH: 6

## 2011-03-11 LAB — HCG, QUANTITATIVE, PREGNANCY: hCG, Beta Chain, Quant, S: 3260 — ABNORMAL HIGH

## 2011-03-11 LAB — URINE MICROSCOPIC-ADD ON

## 2011-03-11 LAB — CBC
HCT: 35.7 — ABNORMAL LOW
MCV: 91.2
Platelets: 354
RDW: 12.3

## 2011-03-11 LAB — HEPATITIS PANEL, ACUTE
HCV Ab: NEGATIVE
Hepatitis B Surface Ag: NEGATIVE

## 2011-03-12 LAB — CBC
HCT: 31.1 — ABNORMAL LOW
HCT: 36.5
Platelets: 348
Platelets: 391
RBC: 3.33 — ABNORMAL LOW
RDW: 12.2
WBC: 11.3 — ABNORMAL HIGH
WBC: 6.3

## 2011-03-12 LAB — COMPREHENSIVE METABOLIC PANEL
ALT: 127 — ABNORMAL HIGH
ALT: 56 — ABNORMAL HIGH
ALT: 69 — ABNORMAL HIGH
ALT: 92 — ABNORMAL HIGH
AST: 121 — ABNORMAL HIGH
AST: 27
AST: 46 — ABNORMAL HIGH
AST: 23
Albumin: 3 — ABNORMAL LOW
Albumin: 3 — ABNORMAL LOW
Albumin: 3.9
Albumin: 3.1 — ABNORMAL LOW
Alkaline Phosphatase: 32 — ABNORMAL LOW
Alkaline Phosphatase: 41
Alkaline Phosphatase: 43
BUN: 2 — ABNORMAL LOW
BUN: 3 — ABNORMAL LOW
BUN: 7
CO2: 24
CO2: 25
CO2: 27
CO2: 27
Calcium: 9.3
Calcium: 9.4
Calcium: 9.5
Calcium: 9.3
Chloride: 105
Chloride: 99
Creatinine, Ser: 0.62
Creatinine, Ser: 0.72
GFR calc Af Amer: >60
GFR calc Af Amer: >60
GFR calc Af Amer: >60
GFR calc Af Amer: >60
GFR calc non Af Amer: >60
GFR calc non Af Amer: >60
GFR calc non Af Amer: >60
GFR calc non Af Amer: >60
Glucose, Bld: 101 — ABNORMAL HIGH
Glucose, Bld: 116 — ABNORMAL HIGH
Glucose, Bld: 146 — ABNORMAL HIGH
Potassium: 3.5
Potassium: 3.6
Potassium: 3.7
Sodium: 131 — ABNORMAL LOW
Sodium: 132 — ABNORMAL LOW
Sodium: 133 — ABNORMAL LOW
Sodium: 134 — ABNORMAL LOW
Total Bilirubin: 0.8
Total Bilirubin: 1
Total Protein: 6.2
Total Protein: 6.5
Total Protein: 7.6
Total Protein: 6.3

## 2011-03-12 LAB — URINALYSIS, ROUTINE W REFLEX MICROSCOPIC
Bilirubin Urine: NEGATIVE
Glucose, UA: 100 — AB
Specific Gravity, Urine: 1.025
Specific Gravity, Urine: 1.01
Urobilinogen, UA: 2 — ABNORMAL HIGH
pH: 6

## 2011-03-12 LAB — URINE CULTURE

## 2011-03-12 LAB — URINE MICROSCOPIC-ADD ON

## 2011-03-12 LAB — POCT URINALYSIS DIP (DEVICE)
Ketones, ur: NEGATIVE
Ketones, ur: 15 — AB
Nitrite: POSITIVE — AB
Operator id: 297281
Operator id: 15968
Protein, ur: NEGATIVE
Protein, ur: 100 — AB
Urobilinogen, UA: 1
Urobilinogen, UA: 1

## 2011-03-12 LAB — PHOSPHORUS: Phosphorus: 4.4

## 2011-03-13 LAB — URINALYSIS, ROUTINE W REFLEX MICROSCOPIC
Ketones, ur: NEGATIVE
Leukocytes, UA: NEGATIVE
Nitrite: NEGATIVE
Specific Gravity, Urine: >1.03 — ABNORMAL HIGH
Urobilinogen, UA: 0.2

## 2011-03-13 LAB — POCT URINALYSIS DIP (DEVICE)
Bilirubin Urine: NEGATIVE
Nitrite: NEGATIVE
Operator id: 120861
Protein, ur: NEGATIVE
pH: 7

## 2011-03-16 LAB — URINALYSIS, ROUTINE W REFLEX MICROSCOPIC
Leukocytes, UA: NEGATIVE
Nitrite: NEGATIVE
Specific Gravity, Urine: 1.02
Urobilinogen, UA: 0.2
pH: 7

## 2011-03-16 LAB — STREP B DNA PROBE: Strep Group B Ag: NEGATIVE

## 2011-03-16 LAB — URINE MICROSCOPIC-ADD ON

## 2011-03-16 LAB — GLUCOSE, CAPILLARY: Glucose-Capillary: 94

## 2011-03-16 LAB — KLEIHAUER-BETKE STAIN: Quantitation Fetal Hemoglobin: 0

## 2011-03-16 LAB — GC/CHLAMYDIA PROBE AMP, GENITAL: GC Probe Amp, Genital: NEGATIVE

## 2011-03-17 LAB — URINE MICROSCOPIC-ADD ON

## 2011-03-17 LAB — URINALYSIS, ROUTINE W REFLEX MICROSCOPIC
Bilirubin Urine: NEGATIVE
Glucose, UA: 500 — AB
Ketones, ur: NEGATIVE
Leukocytes, UA: NEGATIVE
Nitrite: NEGATIVE
Protein, ur: 100 — AB

## 2011-03-17 LAB — COMPREHENSIVE METABOLIC PANEL
ALT: 9
Alkaline Phosphatase: 42
BUN: 4 — ABNORMAL LOW
CO2: 26
Chloride: 104
GFR calc non Af Amer: >60
Glucose, Bld: 146 — ABNORMAL HIGH
Potassium: 3.5
Sodium: 137
Total Bilirubin: 0.4
Total Protein: 6

## 2011-03-17 LAB — CBC
HCT: 27.1 — ABNORMAL LOW
Hemoglobin: 9.2 — ABNORMAL LOW
RBC: 2.88 — ABNORMAL LOW
RDW: 13.5

## 2011-03-17 LAB — PROTEIN, URINE, 24 HOUR
Collection Interval-UPROT: 24
Protein, 24H Urine: 120 — ABNORMAL HIGH
Protein, Urine: 5

## 2011-03-17 LAB — CREATININE CLEARANCE, URINE, 24 HOUR
Collection Interval-CRCL: 24
Creatinine, 24H Ur: 2004 — ABNORMAL HIGH
Urine Total Volume-CRCL: 2400

## 2011-03-19 LAB — POCT URINALYSIS DIP (DEVICE)
Bilirubin Urine: NEGATIVE
Glucose, UA: NEGATIVE mg/dL
Nitrite: NEGATIVE
pH: 8.5 — ABNORMAL HIGH (ref 5.0–8.0)

## 2011-03-19 LAB — URINALYSIS, ROUTINE W REFLEX MICROSCOPIC
Glucose, UA: 100 mg/dL — AB
Ketones, ur: 15 mg/dL — AB
Nitrite: NEGATIVE
Protein, ur: NEGATIVE mg/dL
Specific Gravity, Urine: >1.03 — ABNORMAL HIGH (ref 1.005–1.030)
Urobilinogen, UA: 0.2 mg/dL (ref 0.0–1.0)
pH: 6 (ref 5.0–8.0)

## 2011-03-19 LAB — CREATININE, SERUM
Creatinine, Ser: 0.55 mg/dL (ref 0.4–1.2)
GFR calc Af Amer: >60 mL/min (ref >60)
GFR calc non Af Amer: >60 mL/min (ref >60)

## 2011-03-19 LAB — CBC
HCT: 27.3 % — ABNORMAL LOW (ref 36.0–46.0)
Hemoglobin: 8.8 g/dL — ABNORMAL LOW (ref 12.0–15.0)
Hemoglobin: 9.2 g/dL — ABNORMAL LOW (ref 12.0–15.0)
Hemoglobin: 9.5 g/dL — ABNORMAL LOW (ref 12.0–15.0)
MCHC: 33.6 g/dL (ref 30.0–36.0)
MCHC: 34.3 g/dL (ref 30.0–36.0)
MCV: 91.1 fL (ref 78.0–100.0)
Platelets: 300 10*3/uL (ref 150–400)
RBC: 3.05 MIL/uL — ABNORMAL LOW (ref 3.87–5.11)
RDW: 14.1 % (ref 11.5–15.5)
RDW: 14.9 % (ref 11.5–15.5)
RDW: 14.9 % (ref 11.5–15.5)

## 2011-03-19 LAB — URINE CULTURE

## 2011-03-19 LAB — COMPREHENSIVE METABOLIC PANEL
ALT: 10 U/L (ref 0–35)
AST: 24 U/L (ref 0–37)
BUN: <1 mg/dL — ABNORMAL LOW (ref 6–23)
CO2: 22 mEq/L (ref 19–32)
CO2: 23 mEq/L (ref 19–32)
Calcium: 8.8 mg/dL (ref 8.4–10.5)
Calcium: 8.9 mg/dL (ref 8.4–10.5)
Creatinine, Ser: 0.58 mg/dL (ref 0.4–1.2)
GFR calc Af Amer: >60 mL/min (ref >60)
GFR calc non Af Amer: >60 mL/min (ref >60)
GFR calc non Af Amer: >60 mL/min (ref >60)
Glucose, Bld: 110 mg/dL — ABNORMAL HIGH (ref 70–99)
Sodium: 131 mEq/L — ABNORMAL LOW (ref 135–145)
Total Protein: 6.3 g/dL (ref 6.0–8.3)

## 2011-03-19 LAB — URINE MICROSCOPIC-ADD ON

## 2011-03-19 LAB — DIFFERENTIAL
Basophils Absolute: 0 10*3/uL (ref 0.0–0.1)
Basophils Relative: 1 % (ref 0–1)
Eosinophils Absolute: 0 10*3/uL (ref 0.0–0.7)
Eosinophils Relative: 0 % (ref 0–5)
Lymphocytes Relative: 24 % (ref 12–46)
Monocytes Absolute: 0.7 10*3/uL (ref 0.1–1.0)

## 2011-03-19 LAB — CREATININE CLEARANCE, URINE, 24 HOUR
Collection Interval-CRCL: 24 hours
Creatinine, Urine: 104.9 mg/dL
Creatinine: 0.55 mg/dL (ref 0.40–1.20)
Urine Total Volume-CRCL: 1500 mL

## 2011-03-19 LAB — PROTEIN, URINE, 24 HOUR: Collection Interval-UPROT: 24 hours

## 2011-03-30 LAB — URINALYSIS, ROUTINE W REFLEX MICROSCOPIC
Bilirubin Urine: NEGATIVE
Ketones, ur: NEGATIVE
Leukocytes, UA: NEGATIVE
Nitrite: NEGATIVE
Protein, ur: NEGATIVE
Urobilinogen, UA: 1

## 2011-03-30 LAB — WET PREP, GENITAL
Clue Cells Wet Prep HPF POC: NONE SEEN
Trich, Wet Prep: NONE SEEN
Yeast Wet Prep HPF POC: NONE SEEN

## 2011-03-30 LAB — GC/CHLAMYDIA PROBE AMP, GENITAL: Chlamydia, DNA Probe: NEGATIVE

## 2011-03-30 LAB — POCT PREGNANCY, URINE: Preg Test, Ur: NEGATIVE

## 2011-07-15 ENCOUNTER — Inpatient Hospital Stay (HOSPITAL_COMMUNITY)
Admission: AD | Admit: 2011-07-15 | Discharge: 2011-07-15 | Disposition: A | Discharge status: Home / Self Care | Payer: Self-pay | Source: Ambulatory Visit | Attending: Obstetrics and Gynecology | Admitting: Obstetrics and Gynecology

## 2011-07-15 ENCOUNTER — Encounter (HOSPITAL_COMMUNITY): Payer: Self-pay

## 2011-07-15 DIAGNOSIS — R112 Nausea with vomiting, unspecified: Secondary | ICD-10-CM | POA: Insufficient documentation

## 2011-07-15 DIAGNOSIS — R197 Diarrhea, unspecified: Secondary | ICD-10-CM | POA: Insufficient documentation

## 2011-07-15 DIAGNOSIS — R109 Unspecified abdominal pain: Secondary | ICD-10-CM | POA: Insufficient documentation

## 2011-07-15 LAB — URINE MICROSCOPIC-ADD ON

## 2011-07-15 LAB — CBC
Hemoglobin: 11 g/dL — ABNORMAL LOW (ref 12.0–15.0)
MCH: 29.3 pg (ref 26.0–34.0)
MCHC: 31.5 g/dL (ref 30.0–36.0)
MCV: 93.1 fL (ref 78.0–100.0)
RBC: 3.75 MIL/uL — ABNORMAL LOW (ref 3.87–5.11)

## 2011-07-15 LAB — WET PREP, GENITAL
Trich, Wet Prep: NONE SEEN
Yeast Wet Prep HPF POC: NONE SEEN

## 2011-07-15 LAB — URINALYSIS, ROUTINE W REFLEX MICROSCOPIC
Bilirubin Urine: NEGATIVE
Ketones, ur: NEGATIVE mg/dL
Nitrite: NEGATIVE
Protein, ur: NEGATIVE mg/dL
pH: 6 (ref 5.0–8.0)

## 2011-07-15 MED ORDER — NAPROXEN SODIUM 550 MG PO TABS: 550.0000 mg | ORAL_TABLET | Freq: Two times a day (BID) | ORAL | Status: DC

## 2011-07-15 NOTE — ED Provider Notes (Signed)
History   Patient is a 26 year old, non-pregnant female, with BTL in March 2012 who presents with chief complaint of vaginal bleeding and cramps x 2 days and white mucous discharge x 1 week.  LMP was 06/25/2011.  Vaginal bleeding is described as alternating between, spotting, light, and heavy, though she has not filled a pad or panty liner over the past 2 days.  Cramping is constant x 2 days and she points to both sides of her umbilicus when asked to locate the cramping.  Cramps are not relieved by Advil or warm shower.  White discharge is thin and mucous like, has no odor, and is not enough to fill a pad.  Patient has a history of irregular menses, but says her periods have been regular since BTL.  Reports genital herpes but denies current outbreak and antiviral-use and uses condoms with her sexual partner.  Reports vomiting 3x this morning and diarrhea last night.  Denies fever, headache, chest pain, shortness of breath.    Chief Complaint  Patient presents with  . Vaginal Bleeding   HPI  Past Medical History  Diagnosis Date  . Preterm labor     Past Surgical History  Procedure Date  . Tubal ligation     Family History  Problem Relation Age of Onset  . Anesthesia problems Neg Hx     History  Substance Use Topics  . Smoking status: Former Games developer  . Smokeless tobacco: Never Used  . Alcohol Use: No    Allergies: No Known Allergies  No prescriptions prior to admission    Review of Systems  Constitutional: Negative for fever, chills and diaphoresis.  Eyes: Negative for blurred vision.  Respiratory: Negative for shortness of breath.   Cardiovascular: Negative for chest pain.  Gastrointestinal: Positive for nausea, vomiting (see HPI) and diarrhea (see HPI). Negative for blood in stool.  Genitourinary: Positive for frequency. Negative for dysuria and urgency.  Neurological: Negative for loss of consciousness and headaches.   Physical Exam   Blood pressure 125/90, pulse 72,  temperature 98.4 F (36.9 C), temperature source Oral, resp. rate 16, height 5\' 4"  (1.626 m), weight 104.497 kg (230 lb 6 oz), last menstrual period 06/25/2011, SpO2 99.00%.  Physical Exam  Constitutional: She is oriented to person, place, and time. She appears well-developed and well-nourished. No distress.  HENT:  Head: Normocephalic and atraumatic.  Cardiovascular: Normal rate, regular rhythm and normal heart sounds.   Respiratory: Effort normal and breath sounds normal. No respiratory distress.  GI: Soft. Bowel sounds are normal. She exhibits no distension and no mass. There is tenderness (mild periumbilical). There is no rebound and no guarding.  Genitourinary: There is no rash, tenderness or lesion on the right labia. There is no rash, tenderness or lesion on the left labia. Cervix exhibits no motion tenderness, no discharge and no friability. Right adnexum displays no mass, no tenderness and no fullness. Left adnexum displays no mass, no tenderness and no fullness. There is bleeding (light bleeding and small pool in vault) around the vagina. No erythema or tenderness around the vagina. No foreign body around the vagina. No signs of injury around the vagina. No vaginal discharge found.  Neurological: She is alert and oriented to person, place, and time.  Skin: Skin is warm and dry. She is not diaphoretic.  Psychiatric: She has a normal mood and affect. Her behavior is normal.    MAU Course  Procedures Urinalysis UPT CBC Wet prep GC/Chlamydia Urine culture  Assessment and  Plan  Assessment 1) Abdominal cramps likely due to combination of viral gastroenteritis (nausea, vomiting, diarrhea) and menstrual cycle 2) Early menstrual cycle - 20 days since LMP 3) Many bacteria on UA Plan 1) Discussed symptoms of viral gastroenteritis with patient and importance of rest and fluids 2) Early menses discussed as source of abdominal cramps and vaginal bleeding 3) Rx Anaprox given for pain 4)  Urine sent for culture - results pending 5) Follow up with primary care provider for recurrent symptoms  PIERCE, BENJAMIN 07/15/2011, 2:04 PM   I was present for the exam and agree with the assessment and plan.  Clinton Gallant. Rice III, DrHSc, MPAS, PA-C   Henrietta Hoover, PA 07/15/11 1537

## 2011-07-15 NOTE — Progress Notes (Signed)
Pt states abnormal vaginal bleeding x2days. Cramping at present. Pain constant. White vag d/c. LMP 06/25/2011.

## 2011-07-17 LAB — URINE CULTURE: Colony Count: >=100000

## 2011-07-17 NOTE — ED Provider Notes (Signed)
Agree with above note.  [name] 07/17/2011 5:51 AM

## 2011-11-06 ENCOUNTER — Encounter (HOSPITAL_COMMUNITY): Payer: Self-pay | Admitting: *Deleted

## 2011-11-06 ENCOUNTER — Inpatient Hospital Stay (HOSPITAL_COMMUNITY)
Admission: AD | Admit: 2011-11-06 | Discharge: 2011-11-06 | Disposition: A | Discharge status: Home / Self Care | Payer: Self-pay | Source: Ambulatory Visit | Attending: Obstetrics and Gynecology | Admitting: Obstetrics and Gynecology

## 2011-11-06 DIAGNOSIS — N949 Unspecified condition associated with female genital organs and menstrual cycle: Secondary | ICD-10-CM | POA: Insufficient documentation

## 2011-11-06 DIAGNOSIS — R102 Pelvic and perineal pain: Secondary | ICD-10-CM

## 2011-11-06 DIAGNOSIS — R109 Unspecified abdominal pain: Secondary | ICD-10-CM | POA: Insufficient documentation

## 2011-11-06 HISTORY — DX: Gonococcal infection, unspecified: A54.9

## 2011-11-06 HISTORY — DX: Unspecified abnormal cytological findings in specimens from cervix uteri: R87.619

## 2011-11-06 HISTORY — DX: Reserved for concepts with insufficient information to code with codable children: IMO0002

## 2011-11-06 LAB — URINALYSIS, ROUTINE W REFLEX MICROSCOPIC
Leukocytes, UA: NEGATIVE
Nitrite: NEGATIVE
Protein, ur: NEGATIVE mg/dL
Specific Gravity, Urine: 1.01 (ref 1.005–1.030)
Urobilinogen, UA: 1 mg/dL (ref 0.0–1.0)

## 2011-11-06 LAB — POCT PREGNANCY, URINE: Preg Test, Ur: NEGATIVE

## 2011-11-06 MED ORDER — KETOROLAC TROMETHAMINE 60 MG/2ML IM SOLN
60.0000 mg | Freq: Once | INTRAMUSCULAR | Status: AC
  Administered 2011-11-06: 60 mg via INTRAMUSCULAR
  Filled 2011-11-06: qty 2

## 2011-11-06 NOTE — MAU Provider Note (Signed)
History     CSN: [id number]  Arrival date and time: 11/06/11 1711   First Provider Initiated Contact with Patient 11/06/11 1805      Chief Complaint  Patient presents with  . Abdominal Cramping   HPI  Pt is here with report of increased cramping that started three days ago.  Pain is rated 5/10.  Taking Ibuprofen with some relief.  No vaginal bleeding.   Patient's last menstrual period was 09/28/2011.  Menstrual cycles regular to tubal ligation, reports cycles last longer now, approximately 10 days.     Past Medical History  Diagnosis Date  . Preterm labor   . Gonorrhea   . Abnormal Pap smear     Colpo>normal    Past Surgical History  Procedure Date  . Tubal ligation     2012    Family History  Problem Relation Age of Onset  . Anesthesia problems Neg Hx   . Diabetes Mother   . Hypertension Mother   . Diabetes Father   . Hypertension Father     History  Substance Use Topics  . Smoking status: Never Smoker   . Smokeless tobacco: Never Used  . Alcohol Use: No    Allergies: No Known Allergies  No prescriptions prior to admission    Review of Systems  Gastrointestinal: Positive for abdominal pain (cramping).  All other systems reviewed and are negative.   Physical Exam   Blood pressure 123/58, pulse 73, temperature 98.8 F (37.1 C), temperature source Oral, resp. rate 16, height 5\' 4"  (1.626 m), weight 102.241 kg (225 lb 6.4 oz), last menstrual period 09/28/2011.  Physical Exam  Constitutional: She is oriented to person, place, and time. She appears well-developed and well-nourished. No distress.  HENT:  Head: Normocephalic.  Neck: Normal range of motion. Neck supple.  Cardiovascular: Normal rate and regular rhythm.   Respiratory: Effort normal and breath sounds normal. No respiratory distress.  GI: Soft. There is no tenderness.  Neurological: She is alert and oriented to person, place, and time. She has normal reflexes.  Skin: Skin is warm and dry.    Pt declines pelvic exam>last check in January negative and has not had intercourse since.  MAU Course  Procedures  Results for orders placed during the hospital encounter of 11/06/11 (from the past 24 hour(s))  URINALYSIS, ROUTINE W REFLEX MICROSCOPIC     Status: Abnormal   Collection Time   11/06/11  5:20 PM      Component Value Range   Color, Urine YELLOW  YELLOW    APPearance CLEAR  CLEAR    Specific Gravity, Urine 1.010  1.005 - 1.030    pH 7.0  5.0 - 8.0    Glucose, UA NEGATIVE  NEGATIVE (mg/dL)   Hgb urine dipstick SMALL (*) NEGATIVE    Bilirubin Urine NEGATIVE  NEGATIVE    Ketones, ur NEGATIVE  NEGATIVE (mg/dL)   Protein, ur NEGATIVE  NEGATIVE (mg/dL)   Urobilinogen, UA 1.0  0.0 - 1.0 (mg/dL)   Nitrite NEGATIVE  NEGATIVE    Leukocytes, UA NEGATIVE  NEGATIVE   URINE MICROSCOPIC-ADD ON     Status: Abnormal   Collection Time   11/06/11  5:20 PM      Component Value Range   Squamous Epithelial / LPF FEW (*) RARE    WBC, UA 0-2  <3 (WBC/hpf)   RBC / HPF 7-10  <3 (RBC/hpf)   Bacteria, UA MANY (*) RARE    Urine-Other MUCOUS PRESENT    POCT  PREGNANCY, URINE     Status: Normal   Collection Time   11/06/11  5:53 PM      Component Value Range   Preg Test, Ur NEGATIVE  NEGATIVE       Assessment and Plan  Pelvic Pain  Plan: DC home Ibuprofen as needed F/U prn  [facility] 11/06/2011, 6:10 PM

## 2011-11-06 NOTE — MAU Note (Signed)
Had tubal ligation about 1 year ago has a lot of cramping has irregular bleeding LMP 09/28/11 has not a period this morning, having cramping onset 2 days.

## 2011-11-06 NOTE — Discharge Instructions (Signed)
Abdominal Pain, Women Abdominal (stomach, pelvic, or belly) pain can be caused by many things. It is important to tell your doctor:  The location of the pain.   Does it come and go or is it present all the time?   Are there things that start the pain (eating certain foods, exercise)?   Are there other symptoms associated with the pain (fever, nausea, vomiting, diarrhea)?  All of this is helpful to know when trying to find the cause of the pain. CAUSES   Stomach: virus or bacteria infection, or ulcer.   Intestine: appendicitis (inflamed appendix), regional ileitis (Crohn's disease), ulcerative colitis (inflamed colon), irritable bowel syndrome, diverticulitis (inflamed diverticulum of the colon), or cancer of the stomach or intestine.   Gallbladder disease or stones in the gallbladder.   Kidney disease, kidney stones, or infection.   Pancreas infection or cancer.   Fibromyalgia (pain disorder).   Diseases of the female organs:   Uterus: fibroid (non-cancerous) tumors or infection.   Fallopian tubes: infection or tubal pregnancy.   Ovary: cysts or tumors.   Pelvic adhesions (scar tissue).   Endometriosis (uterus lining tissue growing in the pelvis and on the pelvic organs).   Pelvic congestion syndrome (female organs filling up with blood just before the menstrual period).   Pain with the menstrual period.   Pain with ovulation (producing an egg).   Pain with an IUD (intrauterine device, birth control) in the uterus.   Cancer of the female organs.   Functional pain (pain not caused by a disease, may improve without treatment).   Psychological pain.   Depression.  DIAGNOSIS  Your doctor will decide the seriousness of your pain by doing an examination.  Blood tests.   X-rays.   Ultrasound.   CT scan (computed tomography, special type of X-ray).   MRI (magnetic resonance imaging).   Cultures, for infection.   Barium enema (dye inserted in the large  intestine, to better view it with X-rays).   Colonoscopy (looking in intestine with a lighted tube).   Laparoscopy (minor surgery, looking in abdomen with a lighted tube).   Major abdominal exploratory surgery (looking in abdomen with a large incision).  TREATMENT  The treatment will depend on the cause of the pain.   Many cases can be observed and treated at home.   Over-the-counter medicines recommended by your caregiver.   Prescription medicine.   Antibiotics, for infection.   Birth control pills, for painful periods or for ovulation pain.   Hormone treatment, for endometriosis.   Nerve blocking injections.   Physical therapy.   Antidepressants.   Counseling with a psychologist or psychiatrist.   Minor or major surgery.  HOME CARE INSTRUCTIONS   Do not take laxatives, unless directed by your caregiver.   Take over-the-counter pain medicine only if ordered by your caregiver. Do not take aspirin because it can cause an upset stomach or bleeding.   Try a clear liquid diet (broth or water) as ordered by your caregiver. Slowly move to a bland diet, as tolerated, if the pain is related to the stomach or intestine.   Have a thermometer and take your temperature several times a day, and record it.   Bed rest and sleep, if it helps the pain.   Avoid sexual intercourse, if it causes pain.   Avoid stressful situations.   Keep your follow-up appointments and tests, as your caregiver orders.   If the pain does not go away with medicine or surgery, you may   try:   Acupuncture.   Relaxation exercises (yoga, meditation).   Group therapy.   Counseling.  SEEK MEDICAL CARE IF:   You notice certain foods cause stomach pain.   Your home care treatment is not helping your pain.   You need stronger pain medicine.   You want your IUD removed.   You feel faint or lightheaded.   You develop nausea and vomiting.   You develop a rash.   You are having side effects or  an allergy to your medicine.  SEEK IMMEDIATE MEDICAL CARE IF:   Your pain does not go away or gets worse.   You have a fever.   Your pain is felt only in portions of the abdomen. The right side could possibly be appendicitis. The left lower portion of the abdomen could be colitis or diverticulitis.   You are passing blood in your stools (bright red or black tarry stools, with or without vomiting).   You have blood in your urine.   You develop chills, with or without a fever.   You pass out.  MAKE SURE YOU:   Understand these instructions.   Will watch your condition.   Will get help right away if you are not doing well or get worse.  Document Released: 03/29/2007 Document Revised: 05/21/2011 Document Reviewed: 04/18/2009 [employer] Patient Information 2012 [employer]. 

## 2011-11-15 NOTE — MAU Provider Note (Signed)
Agree with above note.  [name] 11/15/2011 6:03 AM

## 2012-03-04 ENCOUNTER — Encounter (HOSPITAL_COMMUNITY): Payer: Self-pay | Admitting: *Deleted

## 2012-03-04 ENCOUNTER — Inpatient Hospital Stay (HOSPITAL_COMMUNITY)
Admission: AD | Admit: 2012-03-04 | Discharge: 2012-03-04 | Disposition: A | Discharge status: Home / Self Care | Payer: Self-pay | Source: Ambulatory Visit | Attending: Obstetrics and Gynecology | Admitting: Obstetrics and Gynecology

## 2012-03-04 DIAGNOSIS — M545 Low back pain, unspecified: Secondary | ICD-10-CM | POA: Insufficient documentation

## 2012-03-04 DIAGNOSIS — A499 Bacterial infection, unspecified: Secondary | ICD-10-CM | POA: Insufficient documentation

## 2012-03-04 DIAGNOSIS — R05 Cough: Secondary | ICD-10-CM | POA: Insufficient documentation

## 2012-03-04 DIAGNOSIS — R059 Cough, unspecified: Secondary | ICD-10-CM | POA: Insufficient documentation

## 2012-03-04 DIAGNOSIS — R109 Unspecified abdominal pain: Secondary | ICD-10-CM | POA: Insufficient documentation

## 2012-03-04 DIAGNOSIS — N760 Acute vaginitis: Secondary | ICD-10-CM | POA: Insufficient documentation

## 2012-03-04 DIAGNOSIS — J069 Acute upper respiratory infection, unspecified: Secondary | ICD-10-CM | POA: Insufficient documentation

## 2012-03-04 DIAGNOSIS — B9689 Other specified bacterial agents as the cause of diseases classified elsewhere: Secondary | ICD-10-CM | POA: Insufficient documentation

## 2012-03-04 DIAGNOSIS — N72 Inflammatory disease of cervix uteri: Secondary | ICD-10-CM | POA: Insufficient documentation

## 2012-03-04 HISTORY — DX: Urinary tract infection, site not specified: N39.0

## 2012-03-04 HISTORY — DX: Unspecified ovarian cyst, unspecified side: N83.209

## 2012-03-04 LAB — WET PREP, GENITAL: Yeast Wet Prep HPF POC: NONE SEEN

## 2012-03-04 LAB — URINALYSIS, ROUTINE W REFLEX MICROSCOPIC
Glucose, UA: NEGATIVE mg/dL
Specific Gravity, Urine: 1.025 (ref 1.005–1.030)
Urobilinogen, UA: 1 mg/dL (ref 0.0–1.0)
pH: 7 (ref 5.0–8.0)

## 2012-03-04 LAB — URINE MICROSCOPIC-ADD ON

## 2012-03-04 LAB — POCT PREGNANCY, URINE: Preg Test, Ur: NEGATIVE

## 2012-03-04 MED ORDER — AZITHROMYCIN 250 MG PO TABS
1000.0000 mg | ORAL_TABLET | Freq: Once | ORAL | Status: AC
  Administered 2012-03-04: 1000 mg via ORAL
  Filled 2012-03-04: qty 4

## 2012-03-04 MED ORDER — DM-GUAIFENESIN ER 30-600 MG PO TB12
1.0000 | ORAL_TABLET | Freq: Once | ORAL | Status: AC
  Administered 2012-03-04: 1 via ORAL
  Filled 2012-03-04: qty 1

## 2012-03-04 MED ORDER — KETOROLAC TROMETHAMINE 60 MG/2ML IM SOLN
60.0000 mg | Freq: Once | INTRAMUSCULAR | Status: AC
  Administered 2012-03-04: 60 mg via INTRAMUSCULAR
  Filled 2012-03-04: qty 2

## 2012-03-04 MED ORDER — METRONIDAZOLE 500 MG PO TABS: 500.0000 mg | ORAL_TABLET | Freq: Two times a day (BID) | ORAL | Status: DC

## 2012-03-04 MED ORDER — CEFTRIAXONE SODIUM 250 MG IJ SOLR
250.0000 mg | Freq: Once | INTRAMUSCULAR | Status: AC
  Administered 2012-03-04: 250 mg via INTRAMUSCULAR
  Filled 2012-03-04: qty 250

## 2012-03-04 MED ORDER — DM-GUAIFENESIN ER 30-600 MG PO TB12
1.0000 | ORAL_TABLET | Freq: Two times a day (BID) | ORAL | Status: DC
  Filled 2012-03-04: qty 1

## 2012-03-04 NOTE — MAU Note (Signed)
Called pharmacy regarding medication. 

## 2012-03-04 NOTE — MAU Provider Note (Signed)
History     CSN: [id number]  Arrival date and time: 03/04/12 4098   None     Chief Complaint  Patient presents with  . Abdominal Pain   HPI  Pt is not pregnant with complaint with low back pain and also lower abdominal pain for 2 days.  She denies fever, chills, constipation or diarrhea.  She has a new partner for 2 months.  She denies vaginal discharge. She also has cough and congestion- she is taking Dayquil and Nyquil.  Past Medical History  Diagnosis Date  . Preterm labor   . Gonorrhea   . Abnormal Pap smear     Colpo>normal  . Urinary tract infection   . Ovarian cyst     Past Surgical History  Procedure Date  . Tubal ligation     2012    Family History  Problem Relation Age of Onset  . Anesthesia problems Neg Hx   . Other Neg Hx   . Diabetes Mother   . Hypertension Mother   . Diabetes Father   . Hypertension Father     History  Substance Use Topics  . Smoking status: Never Smoker   . Smokeless tobacco: Never Used  . Alcohol Use: No    Allergies: No Known Allergies  No prescriptions prior to admission    Review of Systems  Constitutional: Negative for fever and chills.  Respiratory: Positive for cough. Negative for sputum production and wheezing.   Gastrointestinal: Positive for abdominal pain. Negative for nausea, vomiting, diarrhea and constipation.  Genitourinary: Negative for dysuria, urgency and frequency.  Musculoskeletal: Positive for back pain.   Physical Exam   Blood pressure 122/75, pulse 74, temperature 98.4 F (36.9 C), temperature source Oral, resp. rate 20, height 5' 4.5" (1.638 m), weight 97.07 kg (214 lb), last menstrual period 01/28/2012, SpO2 99.00%.  Physical Exam  Nursing note and vitals reviewed. Constitutional: She is oriented to person, place, and time. She appears well-developed and well-nourished.  HENT:  Head: Normocephalic.  Eyes: Pupils are equal, round, and reactive to light.  Neck: Normal range of motion.  Neck supple.  Cardiovascular: Normal rate, regular rhythm and normal heart sounds.   Respiratory: Effort normal and breath sounds normal.       Dry non productive cough  GI: Soft. She exhibits no distension. There is no tenderness. There is no rebound and no guarding.  Genitourinary:       Small amount of dark red blood in vault; cervix not visualized with Vonita Moss Speculum; bimanual cervix parous somewhat tender; bimanual diffusely tender- no palpable enlargement  Musculoskeletal: Normal range of motion.  Neurological: She is alert and oriented to person, place, and time.  Skin: Skin is warm and dry.  Psychiatric: She has a normal mood and affect.    MAU Course  Procedures Toradol 60mg  IM given for pain with relief Mucinex DM given for cough Since pt has new partner, has started menses and is tender with exam, will treat for cervicitis Rocephin and Zithromax Results for orders placed during the hospital encounter of 03/04/12 (from the past 24 hour(s))  URINALYSIS, ROUTINE W REFLEX MICROSCOPIC     Status: Abnormal   Collection Time   03/04/12  7:48 AM      Component Value Range   Color, Urine YELLOW  YELLOW   APPearance CLEAR  CLEAR   Specific Gravity, Urine 1.025  1.005 - 1.030   pH 7.0  5.0 - 8.0   Glucose, UA NEGATIVE  NEGATIVE mg/dL  Hgb urine dipstick MODERATE (*) NEGATIVE   Bilirubin Urine NEGATIVE  NEGATIVE   Ketones, ur NEGATIVE  NEGATIVE mg/dL   Protein, ur NEGATIVE  NEGATIVE mg/dL   Urobilinogen, UA 1.0  0.0 - 1.0 mg/dL   Nitrite NEGATIVE  NEGATIVE   Leukocytes, UA NEGATIVE  NEGATIVE  URINE MICROSCOPIC-ADD ON     Status: Normal   Collection Time   03/04/12  7:48 AM      Component Value Range   Squamous Epithelial / LPF RARE  RARE   RBC / HPF 7-10  <3 RBC/hpf  POCT PREGNANCY, URINE     Status: Normal   Collection Time   03/04/12  8:00 AM      Component Value Range   Preg Test, Ur NEGATIVE  NEGATIVE  WET PREP, GENITAL     Status: Abnormal   Collection Time    03/04/12  8:25 AM      Component Value Range   Yeast Wet Prep HPF POC NONE SEEN  NONE SEEN   Trich, Wet Prep NONE SEEN  NONE SEEN   Clue Cells Wet Prep HPF POC FEW (*) NONE SEEN   WBC, Wet Prep HPF POC FEW (*) NONE SEEN  GC/Chlamdyia pending Assessment and Plan  Cervicitis- treated in MAU Bacterial vaginosis- prescription for Flagyl 500mg  BID for 7 days URI- OTC preparations including but not limited to Mucinex DM, NSAIDs  Roshan Salamon 03/04/2012, 8:13 AM

## 2012-03-04 NOTE — MAU Note (Signed)
Real bad lower cramps, past 2 days.  Feel like has a urinary tract infection.  Starting to tingle when she pees.

## 2012-03-05 LAB — GC/CHLAMYDIA PROBE AMP, GENITAL: GC Probe Amp, Genital: NEGATIVE

## 2012-03-07 NOTE — MAU Provider Note (Signed)
Attestation of Attending Supervision of Advanced Practitioner (CNM/NP): Evaluation and management procedures were performed by the Advanced Practitioner under my supervision and collaboration.  I have reviewed the Advanced Practitioner's note and chart, and I agree with the management and plan.  Dosha Broshears 03/07/2012 9:00 AM

## 2012-10-14 ENCOUNTER — Encounter: Payer: Self-pay | Admitting: *Deleted

## 2012-11-14 ENCOUNTER — Inpatient Hospital Stay (HOSPITAL_COMMUNITY)
Admission: AD | Admit: 2012-11-14 | Discharge: 2012-11-14 | Disposition: A | Discharge status: Home / Self Care | Payer: 59 | Source: Ambulatory Visit | Attending: Obstetrics & Gynecology | Admitting: Obstetrics & Gynecology

## 2012-11-14 ENCOUNTER — Encounter (HOSPITAL_COMMUNITY): Payer: Self-pay

## 2012-11-14 DIAGNOSIS — N390 Urinary tract infection, site not specified: Secondary | ICD-10-CM | POA: Insufficient documentation

## 2012-11-14 DIAGNOSIS — N949 Unspecified condition associated with female genital organs and menstrual cycle: Secondary | ICD-10-CM | POA: Insufficient documentation

## 2012-11-14 DIAGNOSIS — B9689 Other specified bacterial agents as the cause of diseases classified elsewhere: Secondary | ICD-10-CM | POA: Insufficient documentation

## 2012-11-14 DIAGNOSIS — N760 Acute vaginitis: Secondary | ICD-10-CM | POA: Insufficient documentation

## 2012-11-14 DIAGNOSIS — A499 Bacterial infection, unspecified: Secondary | ICD-10-CM | POA: Insufficient documentation

## 2012-11-14 DIAGNOSIS — R109 Unspecified abdominal pain: Secondary | ICD-10-CM | POA: Insufficient documentation

## 2012-11-14 LAB — URINE MICROSCOPIC-ADD ON

## 2012-11-14 LAB — URINALYSIS, ROUTINE W REFLEX MICROSCOPIC
Nitrite: NEGATIVE
Specific Gravity, Urine: >1.03 — ABNORMAL HIGH (ref 1.005–1.030)
Urobilinogen, UA: 0.2 mg/dL (ref 0.0–1.0)
pH: 6 (ref 5.0–8.0)

## 2012-11-14 LAB — WET PREP, GENITAL
Trich, Wet Prep: NONE SEEN
Yeast Wet Prep HPF POC: NONE SEEN

## 2012-11-14 LAB — POCT PREGNANCY, URINE: Preg Test, Ur: NEGATIVE

## 2012-11-14 MED ORDER — KETOROLAC TROMETHAMINE 60 MG/2ML IM SOLN
60.0000 mg | Freq: Once | INTRAMUSCULAR | Status: AC
  Administered 2012-11-14: 60 mg via INTRAMUSCULAR
  Filled 2012-11-14: qty 2

## 2012-11-14 MED ORDER — CIPROFLOXACIN HCL 500 MG PO TABS: 500.0000 mg | ORAL_TABLET | Freq: Two times a day (BID) | ORAL | Status: AC

## 2012-11-14 MED ORDER — PHENAZOPYRIDINE HCL 200 MG PO TABS: 200.0000 mg | ORAL_TABLET | Freq: Three times a day (TID) | ORAL | Status: DC | PRN

## 2012-11-14 MED ORDER — PHENAZOPYRIDINE HCL 100 MG PO TABS
200.0000 mg | ORAL_TABLET | Freq: Once | ORAL | Status: AC
  Administered 2012-11-14: 200 mg via ORAL
  Filled 2012-11-14: qty 2

## 2012-11-14 MED ORDER — KETOROLAC TROMETHAMINE 10 MG PO TABS: 10.0000 mg | ORAL_TABLET | Freq: Four times a day (QID) | ORAL | Status: DC | PRN

## 2012-11-14 NOTE — MAU Provider Note (Signed)
Chief Complaint: Abdominal Cramping  First Provider Initiated Contact with Patient 11/14/12 2130     SUBJECTIVE HPI: [name] is a 27 y.o. [id number] female who presents with low abdominal cramping x3 days and white vaginal discharge with odor. LMP 10/30/2012.    Past Medical History  Diagnosis Date  . Preterm labor   . Gonorrhea   . Abnormal Pap smear     Colpo>normal  . Urinary tract infection   . Ovarian cyst    OB History   Grav Para Term Preterm Abortions TAB SAB Ect Mult Living   6 3 1 2 3 3   1 4      # Outc Date GA Lbr Len/2nd Wgt Sex Del Anes PTL Lv   1 TAB            2 TAB            3 TAB            4 PRE            5A PRE         Yes   5B          Yes   6 TRM              Past Surgical History  Procedure Laterality Date  . Tubal ligation      2012  . Colposcopy     History   Social History  . Marital Status: Single    Spouse Name: N/A    Number of Children: N/A  . Years of Education: N/A   Occupational History  . Not on file.   Social History Main Topics  . Smoking status: Never Smoker   . Smokeless tobacco: Never Used  . Alcohol Use: No  . Drug Use: No  . Sexually Active: Yes    Birth Control/ Protection: Surgical   Other Topics Concern  . Not on file   Social History Narrative  . No narrative on file   No current facility-administered medications on file prior to encounter.   No current outpatient prescriptions on file prior to encounter.   No Known Allergies  ROS: Pertinent items in HPI  OBJECTIVE Blood pressure 134/85, pulse 78, temperature 98.9 F (37.2 C), temperature source Oral, resp. rate 18, height 5\' 4"  (1.626 m), weight 100.245 kg (221 lb), last menstrual period 10/30/2012. GENERAL: Well-developed, well-nourished female in no acute distress.  HEENT: Normocephalic HEART: normal rate RESP: normal effort ABDOMEN: Soft, mild suprapubic tenderness. Positive bowel sounds x4. No rebound tenderness or mass. BACK: No CVA  tenderness EXTREMITIES: Nontender, no edema NEURO: Alert and oriented SPECULUM EXAM: NEFG, small amount of clear and white mildly malodorous discharge, no blood noted, cervix clean BIMANUAL: cervix closed; uterus normal size, no adnexal tenderness or masses. No cervical motion tenderness. Mild bladder and tenderness.  LAB RESULTS Results for orders placed during the hospital encounter of 11/14/12 (from the past 24 hour(s))  URINALYSIS, ROUTINE W REFLEX MICROSCOPIC     Status: Abnormal   Collection Time    11/14/12  8:10 PM      Result Value Range   Color, Urine YELLOW  YELLOW   APPearance CLEAR  CLEAR   Specific Gravity, Urine >1.030 (*) 1.005 - 1.030   pH 6.0  5.0 - 8.0   Glucose, UA NEGATIVE  NEGATIVE mg/dL   Hgb urine dipstick LARGE (*) NEGATIVE   Bilirubin Urine NEGATIVE  NEGATIVE   Ketones, ur NEGATIVE  NEGATIVE mg/dL   Protein, ur NEGATIVE  NEGATIVE mg/dL   Urobilinogen, UA 0.2  0.0 - 1.0 mg/dL   Nitrite NEGATIVE  NEGATIVE   Leukocytes, UA NEGATIVE  NEGATIVE  URINE MICROSCOPIC-ADD ON     Status: Abnormal   Collection Time    11/14/12  8:10 PM      Result Value Range   Squamous Epithelial / LPF FEW (*) RARE   WBC, UA 0-2  <3 WBC/hpf   RBC / HPF 3-6  <3 RBC/hpf   Bacteria, UA RARE  RARE   Urine-Other MUCOUS PRESENT    POCT PREGNANCY, URINE     Status: None   Collection Time    11/14/12  8:19 PM      Result Value Range   Preg Test, Ur NEGATIVE  NEGATIVE  WET PREP, GENITAL     Status: Abnormal   Collection Time    11/14/12  9:42 PM      Result Value Range   Yeast Wet Prep HPF POC NONE SEEN  NONE SEEN   Trich, Wet Prep NONE SEEN  NONE SEEN   Clue Cells Wet Prep HPF POC FEW (*) NONE SEEN   WBC, Wet Prep HPF POC FEW (*) NONE SEEN    IMAGING No results found.  MAU COURSE Pain resolved with Toradol and Pyridium.  ASSESSMENT 1. UTI (lower urinary tract infection)   2. BV (bacterial vaginosis)     PLAN Discharge home in stable condition. Increase fluids.      Follow-up Information   Follow up with primary care provider. (As needed if symptoms worsen)        Medication List    STOP taking these medications       ibuprofen 200 MG tablet  Commonly known as:  ADVIL,MOTRIN      TAKE these medications       ciprofloxacin 500 MG tablet  Commonly known as:  CIPRO  Take 1 tablet (500 mg total) by mouth 2 (two) times daily.     ketorolac 10 MG tablet  Commonly known as:  TORADOL  Take 1 tablet (10 mg total) by mouth every 6 (six) hours as needed for pain.     phenazopyridine 200 MG tablet  Commonly known as:  PYRIDIUM  Take 1 tablet (200 mg total) by mouth 3 (three) times daily as needed for pain.       [place], [place] 11/14/2012  10:54 PM

## 2012-11-14 NOTE — MAU Note (Signed)
Pt LMP 10/30/2012, cramping x 3 days.  White vaginal discharge with slight odor.

## 2012-11-30 NOTE — MAU Provider Note (Signed)
Attestation of Attending Supervision of Advanced Practitioner (CNM/NP): Evaluation and management procedures were performed by the Advanced Practitioner under my supervision and collaboration. I have reviewed the Advanced Practitioner's note and chart, and I agree with the management and plan.  LEGGETT,KELLY H. 11:47 AM   

## 2013-02-01 ENCOUNTER — Encounter (HOSPITAL_COMMUNITY): Payer: Self-pay | Admitting: Emergency Medicine

## 2013-02-01 ENCOUNTER — Inpatient Hospital Stay (HOSPITAL_COMMUNITY)
Admission: EM | Admit: 2013-02-01 | Discharge: 2013-02-04 | DRG: 059 | Disposition: A | Discharge status: Home Health | Payer: 59 | Attending: Internal Medicine | Admitting: Internal Medicine

## 2013-02-01 ENCOUNTER — Emergency Department (HOSPITAL_COMMUNITY): Payer: 59

## 2013-02-01 DIAGNOSIS — R209 Unspecified disturbances of skin sensation: Secondary | ICD-10-CM | POA: Diagnosis present

## 2013-02-01 DIAGNOSIS — R109 Unspecified abdominal pain: Secondary | ICD-10-CM

## 2013-02-01 DIAGNOSIS — D509 Iron deficiency anemia, unspecified: Secondary | ICD-10-CM | POA: Diagnosis present

## 2013-02-01 DIAGNOSIS — G35 Multiple sclerosis: Principal | ICD-10-CM | POA: Diagnosis present

## 2013-02-01 DIAGNOSIS — F411 Generalized anxiety disorder: Secondary | ICD-10-CM | POA: Diagnosis present

## 2013-02-01 DIAGNOSIS — R5381 Other malaise: Secondary | ICD-10-CM | POA: Diagnosis present

## 2013-02-01 DIAGNOSIS — G35D Multiple sclerosis, unspecified: Secondary | ICD-10-CM | POA: Diagnosis present

## 2013-02-01 DIAGNOSIS — R0789 Other chest pain: Secondary | ICD-10-CM | POA: Diagnosis present

## 2013-02-01 DIAGNOSIS — N390 Urinary tract infection, site not specified: Secondary | ICD-10-CM | POA: Diagnosis present

## 2013-02-01 DIAGNOSIS — E669 Obesity, unspecified: Secondary | ICD-10-CM

## 2013-02-01 HISTORY — DX: Myoneural disorder, unspecified: G70.9

## 2013-02-01 LAB — COMPREHENSIVE METABOLIC PANEL
AST: 18 U/L (ref 0–37)
Albumin: 3.9 g/dL (ref 3.5–5.2)
Alkaline Phosphatase: 42 U/L (ref 39–117)
Chloride: 102 mEq/L (ref 96–112)
Creatinine, Ser: 0.9 mg/dL (ref 0.50–1.10)
Potassium: 3.8 mEq/L (ref 3.5–5.1)
Sodium: 138 mEq/L (ref 135–145)
Total Bilirubin: 0.7 mg/dL (ref 0.3–1.2)

## 2013-02-01 LAB — POCT PREGNANCY, URINE: Preg Test, Ur: NEGATIVE

## 2013-02-01 LAB — CBC WITH DIFFERENTIAL/PLATELET
Basophils Absolute: 0.1 10*3/uL (ref 0.0–0.1)
Lymphocytes Relative: 37 % (ref 12–46)
Monocytes Relative: 9 % (ref 3–12)
Neutrophils Relative %: 52 % (ref 43–77)
Platelets: 384 10*3/uL (ref 150–400)
RDW: 12.1 % (ref 11.5–15.5)
WBC: 7.3 10*3/uL (ref 4.0–10.5)

## 2013-02-01 MED ORDER — GADOBENATE DIMEGLUMINE 529 MG/ML IV SOLN
20.0000 mL | Freq: Once | INTRAVENOUS | Status: AC | PRN
  Administered 2013-02-01: 20 mL via INTRAVENOUS

## 2013-02-01 MED ORDER — SODIUM CHLORIDE 0.9 % IV SOLN
500.0000 mg | Freq: Two times a day (BID) | INTRAVENOUS | Status: DC
  Administered 2013-02-02 – 2013-02-04 (×6): 500 mg via INTRAVENOUS
  Filled 2013-02-01 (×6): qty 4

## 2013-02-01 NOTE — ED Notes (Addendum)
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 States that about a week ago, she noticed that her feet would go to sleep.  States that now, when she bends her head down (chin to chest), from her knee to her toes, goes tingly.  States she missed her period.

## 2013-02-01 NOTE — ED Provider Notes (Signed)
TIME SEEN: 8:07 PM  CHIEF COMPLAINT: Lower extremity numbness and tingling  HPI: Patient is a 27 year old female with no significant past medical history who presents to the emergency department with one week of numbness and tingling in her bilateral lower extremities from her knees down. 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 She states it has made it difficult for her to drive. She states that also when she bends her neck and put her chin to her chest she begins to feel sharp shooting electric pains down the front of her legs bilaterally from the knee down. She denies any other numbness or tingling. She denies any focal weakness. No history of head injury or neck or back injury. She denies any neck or back pain. She has had a mild diffuse gradual onset headache that she has had in the past. She also reports she has had mild blurry vision and urinary frequency and incontinence. No fecal incontinence. No recent fever. No history of any neurologic illness. No family history of any neurologic illness.  ROS: See HPI Constitutional: no fever  Eyes: no drainage  ENT: no runny nose   Cardiovascular:  no chest pain  Resp: no SOB  GI: no vomiting GU: no dysuria Integumentary: no rash  Allergy: no hives  Musculoskeletal: no leg swelling  Neurological: no slurred speech ROS otherwise negative  PAST MEDICAL HISTORY/PAST SURGICAL HISTORY:  Past Medical History  Diagnosis Date  . Preterm labor   . Gonorrhea   . Abnormal Pap smear     Colpo>normal  . Urinary tract infection   . Ovarian cyst     MEDICATIONS:  Prior to Admission medications   Medication Sig Start Date End Date Taking? Authorizing Provider  ibuprofen (ADVIL,MOTRIN) 200 MG tablet Take by mouth every 6 (six) hours as needed for pain.   Yes Historical Provider, MD    ALLERGIES:  No Known  Allergies  SOCIAL HISTORY:  History  Substance Use Topics  . Smoking status: Never Smoker   . Smokeless tobacco: Never Used  . Alcohol Use: No    FAMILY HISTORY: Family History  Problem Relation Age of Onset  . Anesthesia problems Neg Hx   . Other Neg Hx   . Diabetes Mother   . Hypertension Mother   . Diabetes Father   . Hypertension Father     EXAM: BP 154/105  Pulse 80  Temp(Src) 99 F (37.2 C) (Oral)  Resp 18  SpO2 100%  LMP 01/22/2013 CONSTITUTIONAL: Alert and oriented and responds appropriately to questions. Well-appearing; well-nourished HEAD: Normocephalic EYES: Conjunctivae clear, PERRL, extraocular movements intact ENT: normal nose; no rhinorrhea; moist mucous membranes; pharynx without lesions noted NECK: Supple, no meningismus, no LAD , when patient flexes her neck she experiences electric pains in her lower extremities from the knees down, no midline spinal tenderness, step-off or deformity CARD: RRR; S1 and S2 appreciated; no murmurs, no clicks, no rubs, no gallops RESP: Normal chest excursion without splinting or tachypnea; breath sounds clear and equal bilaterally; no wheezes, no rhonchi, no rales,  ABD/GI: Normal bowel sounds; non-distended; soft, non-tender, no rebound, no guarding BACK:  The back appears normal and is non-tender to palpation, there is no CVA tenderness EXT: Normal ROM in all joints; non-tender to palpation; no edema; normal capillary refill; no cyanosis    SKIN: Normal  color for age and race; warm NEURO: Moves all extremities equally strength 5/5 in all 4 extremities, sensation to light touch slightly diminished from her knees to her toes in bilateral lower extremities, no footdrop, normal gait, reflexes in all 4 extremities are 4+, no clonus PSYCH: The patient's mood and manner are appropriate. Grooming and personal hygiene are appropriate.  MEDICAL DECISION MAKING: Patient was concerning symptoms for possible for cervical myelopathy. No  difficulty with proprioception. Given her hyper-reflexia, obtain an MRI of her brain and cervical spine. Less likely cauda equina given she is hyperreflexic. No recent infectious symptoms. No neck or back pain. No history of trauma. Discussed MRI technician that if they find anything abnormal on her imaging to perform a thoracic and lumbar MRI without contrast. We'll also obtain basic labs, urinalysis.    ED PROGRESS: Labs unremarkable. Urine pending. Patient is currently in MRI.  11:46 PM  Pt MRA of brain and cervical spine show enhancing lesions consistent with multiple sclerosis. Will discuss with neurology for management.  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 Recommends Solu-Medrol 500 mg twice daily.  Will admit to hospitatlist.  Pt has no PCP.  Layla Maw Rodman Recupero, DO 02/02/13 0001

## 2013-02-01 NOTE — ED Notes (Signed)
Pt to MRI at this time.

## 2013-02-02 ENCOUNTER — Encounter (HOSPITAL_COMMUNITY): Payer: Self-pay | Admitting: *Deleted

## 2013-02-02 ENCOUNTER — Inpatient Hospital Stay (HOSPITAL_COMMUNITY): Payer: 59

## 2013-02-02 DIAGNOSIS — G35 Multiple sclerosis: Principal | ICD-10-CM

## 2013-02-02 LAB — CBC
HCT: 37.9 % (ref 36.0–46.0)
Hemoglobin: 12.3 g/dL (ref 12.0–15.0)
MCH: 29.8 pg (ref 26.0–34.0)
RBC: 4.13 MIL/uL (ref 3.87–5.11)

## 2013-02-02 LAB — COMPREHENSIVE METABOLIC PANEL
AST: 16 U/L (ref 0–37)
CO2: 25 mEq/L (ref 19–32)
Calcium: 9.6 mg/dL (ref 8.4–10.5)
Creatinine, Ser: 0.84 mg/dL (ref 0.50–1.10)
GFR calc non Af Amer: >90 mL/min (ref >90)
Sodium: 135 mEq/L (ref 135–145)
Total Protein: 7.5 g/dL (ref 6.0–8.3)

## 2013-02-02 LAB — GRAM STAIN

## 2013-02-02 LAB — CSF CELL COUNT WITH DIFFERENTIAL
RBC Count, CSF: 0 /mm3
Tube #: 1
WBC, CSF: 13 /mm3 (ref 0–5)

## 2013-02-02 LAB — VITAMIN B12: Vitamin B-12: 309 pg/mL (ref 211–911)

## 2013-02-02 LAB — RETICULOCYTES
RBC.: 3.68 MIL/uL — ABNORMAL LOW (ref 3.87–5.11)
Retic Count, Absolute: 81 10*3/uL (ref 19.0–186.0)

## 2013-02-02 LAB — PROTEIN AND GLUCOSE, CSF: Glucose, CSF: 68 mg/dL (ref 43–76)

## 2013-02-02 LAB — IRON AND TIBC
Saturation Ratios: 25 % (ref 20–55)
TIBC: 350 ug/dL (ref 250–470)
UIBC: 261 ug/dL (ref 125–400)

## 2013-02-02 LAB — TROPONIN I
Troponin I: <0.3 ng/mL (ref <0.30)
Troponin I: <0.3 ng/mL (ref <0.30)

## 2013-02-02 LAB — FOLATE: Folate: 9.7 ng/mL

## 2013-02-02 MED ORDER — TRAMADOL HCL 50 MG PO TABS
50.0000 mg | ORAL_TABLET | Freq: Four times a day (QID) | ORAL | Status: DC | PRN
  Administered 2013-02-02: 100 mg via ORAL
  Administered 2013-02-02: 50 mg via ORAL
  Administered 2013-02-03 – 2013-02-04 (×2): 100 mg via ORAL
  Filled 2013-02-02: qty 2
  Filled 2013-02-02: qty 1
  Filled 2013-02-02 (×2): qty 2

## 2013-02-02 MED ORDER — HEPARIN SODIUM (PORCINE) 5000 UNIT/ML IJ SOLN
5000.0000 [IU] | Freq: Three times a day (TID) | INTRAMUSCULAR | Status: DC
  Filled 2013-02-02: qty 1

## 2013-02-02 MED ORDER — ACETAMINOPHEN 325 MG PO TABS
650.0000 mg | ORAL_TABLET | Freq: Four times a day (QID) | ORAL | Status: DC | PRN
  Administered 2013-02-02: 650 mg via ORAL
  Filled 2013-02-02: qty 2

## 2013-02-02 MED ORDER — ONDANSETRON HCL 4 MG/2ML IJ SOLN: 4.0000 mg | Freq: Four times a day (QID) | INTRAMUSCULAR | Status: DC | PRN

## 2013-02-02 MED ORDER — ACETAMINOPHEN 650 MG RE SUPP: 650.0000 mg | Freq: Four times a day (QID) | RECTAL | Status: DC | PRN

## 2013-02-02 NOTE — H&P (Signed)
Hospitalist Admission History and Physical  Patient name: [name] Medical record number: [id number] Date of birth: [birth year] Age: 27 y.o. Gender: female  Primary Care Provider: No primary provider on file.  Chief Complaint: MS  History of Present Illness:This is a 27 y.o. year old female with no significant prior medical history apart from preeclampsia her last pregnancy in 2009 presenting with recurrent lotion the tingling numbness weakness of the past 2 weeks with new onset diagnosis of multiple sclerosis. Patient drives a bus for work. Patient has noticed recurrent episodes of distal extremity tingling numbness and weakness. Patient is also has a mild numbness in her hands though she's not sure this is related to her driving. Patient states that she noticed some worsening leg pain and weakness whenever she flexes her neck over the past 24 hours and presented to the ER for further evaluation. Patient denies any shortness of breath or recent infections. Patient does report some intermittent chest pains/discomfort that she feels are related to anxiety. No chest pain currently or the past 24-48 hours.  Upon presentation to the ER an MRI of the brain was obtained showing hyperintense lesions had C1, C5, C6 concern for multiple sclerosis. Neurology is consult with recommendation for high dose steroids and inpatient admission.  Patient Active Problem List   Diagnosis Date Noted  . 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 OVARIAN CYST, RIGHT 07/25/2009  . HYPEREMESIS, SEVERE 07/23/2008  . PRE-ECLAMPSIA 03/21/2008  . PREGNANCY-INDUCED HYPERTENSION 03/21/2008  . ABDOMINAL PAIN, CHRONIC 07/29/2007  . OBESITY, NOS 08/12/2006   Past Medical History: Past Medical History  Diagnosis Date  . Preterm labor   . Gonorrhea   . Abnormal Pap smear     Colpo>normal  . Urinary tract infection   . Ovarian cyst     Past Surgical History: Past Surgical History  Procedure Laterality Date  . Tubal ligation       2012  . Colposcopy      Social History: History   Social History  . Marital Status: Single    Spouse Name: N/A    Number of Children: N/A  . Years of Education: N/A   Social History Main Topics  . Smoking status: Never Smoker   . Smokeless tobacco: Never Used  . Alcohol Use: No  . Drug Use: No  . Sexual Activity: Yes    Birth Control/ Protection: Surgical   Other Topics Concern  . None   Social History Narrative  . None    Family History: Family History  Problem Relation Age of Onset  . Anesthesia problems Neg Hx   . Other Neg Hx   . Diabetes Mother   . Hypertension Mother   . Diabetes Father   . Hypertension Father     Allergies: No Known Allergies  Current Facility-Administered Medications  Medication Dose Route Frequency Provider Last Rate Last Dose  . acetaminophen (TYLENOL) tablet 650 mg  650 mg Oral Q6H PRN Doree Albee, MD       Or  . acetaminophen (TYLENOL) suppository 650 mg  650 mg Rectal Q6H PRN Doree Albee, MD      . heparin injection 5,000 Units  5,000 Units Subcutaneous Q8H Doree Albee, MD      . methylPREDNISolone sodium succinate (SOLU-MEDROL) 500 mg in sodium chloride 0.9 % 50 mL IVPB  500 mg Intravenous Q12H Kristen N Ward, DO       Current Outpatient Prescriptions  Medication Sig Dispense Refill  . ibuprofen (ADVIL,MOTRIN) 200 MG tablet Take  by mouth every 6 (six) hours as needed for pain.       Review Of Systems: 12 point ROS negative except as noted above in HPI.  Physical Exam: Filed Vitals:   02/01/13 1845  BP: 154/105  Pulse: 80  Temp: 99 F (37.2 C)  Resp: 18    General: obese, NAD  HEENT: PERRLA and extra ocular movement intact Heart: S1, S2 normal, no murmur, rub or gallop, regular rate and rhythm Lungs: clear to auscultation, no wheezes or rales and unlabored breathing Abdomen: Obese abdomen, nontender Extremities: extremities normal, atraumatic, no cyanosis or edema Skin:no rashes Neurology: Positive  hyperreflexia, left greater than right. Strength intact. No distal numbness. Faint paresthesias and lotion knee distally bilaterally. Positive radiating distal lotion the pain bilaterally with passive neck flexion.  Labs and Imaging: Lab Results  Component Value Date/Time   NA 138 02/01/2013  8:15 PM   K 3.8 02/01/2013  8:15 PM   CL 102 02/01/2013  8:15 PM   CO2 27 02/01/2013  8:15 PM   BUN 9 02/01/2013  8:15 PM   CREATININE 0.90 02/01/2013  8:15 PM   CREATININE 0.65 08/24/2010  4:23 PM   GLUCOSE 85 02/01/2013  8:15 PM   Lab Results  Component Value Date   WBC 7.3 02/01/2013   HGB 11.2* 02/01/2013   HCT 35.2* 02/01/2013   MCV 93.1 02/01/2013   PLT 384 02/01/2013    Mr Brain W Wo Contrast  02/01/2013   *RADIOLOGY REPORT*  Clinical Data:  Bilateral lower extremity numbness and tingling  MRI HEAD WITHOUT AND WITH CONTRAST MRI CERVICAL SPINE WITHOUT AND WITH CONTRAST  Technique:  Multiplanar, multiecho pulse sequences of the brain and surrounding structures, and cervical spine, to include the craniocervical junction and cervicothoracic junction, were obtained without and with intravenous contrast.  Contrast: 20mL MULTIHANCE GADOBENATE DIMEGLUMINE 529 MG/ML IV SOLN,  Comparison:   None.  MRI HEAD  Findings:  A few small foci of hyperintensity T / FLAIR signal are seen involving the periventricular white matter.  Specifically, these are seen adjacent to the posterior horn of the right lateral ventricle (series 6, image 14), and adjacent to the lateral aspect of the left lateral ventricle (series 6, image 14).  The right- sided lesion is oriented perpendicular to the ventricles.  No juxta cortical lesions are identified.  No infratentorial lesions are seen.  These are nonspecific, but may be related to possible underlying demyelinating process given the patient's young age. A lesion located in the left centrum semi ovale demonstrates enhancement on post contrast sequences ( series 3, image 34).  No other  enhancing lesions identified.  The CSF containing spaces are normal without evidence of hydrocephalous.  There is no acute intracranial hemorrhage or infarct.  No diffusion weighted signal abnormality.  No extra-axial fluid collection.  The midline structures are intact.  Corpus callosum is normal. Pituitary gland and pituitary stalk are normal.  The craniocervical junction is within normal limits.  IMPRESSION: Periventricular T2 / FLAIR hyperintense lesions.  These findings are most consistent with multiple sclerosis. Subtly enhancing lesion within the left centrum semiovale most likely reflects active demyelination.  MRI CERVICAL SPINE  Findings: The vertebral bodies are normally aligned with preservation of the normal cervical lordosis.  The vertebral body heights are preserved.  Signal intensity from the vertebral body bone marrow is normal.  There is no significant canal or neural foraminal stenosis identified.  Patchy T2 hyperintense signal intensity is seen within the dorsal aspect  of the cord at the level of the the craniocervical junction and C1, as well as distally at C5-6.  A 7 mm mm enhancing lesion within the dorsal aspect of the spinal cord at the level of C1 (series 8, image 10).  A second 4 mm enhancing lesion seen within the dorsal aspect of the cord. These findings are consistent with underlying demyelinating process such as multiple sclerosis.  IMPRESSION:  Patchy T2 hyperintense signal intensity at the craniocervical junction and C5-6, worrisome for multiple sclerosis.  Enhancing lesions at C1 and C6 likely reflect active demyelination.   Original Report Authenticated By: Rise Mu, M.D.   Mr Cervical Spine W Wo Contrast  02/01/2013   *RADIOLOGY REPORT*  Clinical Data:  Bilateral lower extremity numbness and tingling  MRI HEAD WITHOUT AND WITH CONTRAST MRI CERVICAL SPINE WITHOUT AND WITH CONTRAST  Technique:  Multiplanar, multiecho pulse sequences of the brain and surrounding  structures, and cervical spine, to include the craniocervical junction and cervicothoracic junction, were obtained without and with intravenous contrast.  Contrast: 20mL MULTIHANCE GADOBENATE DIMEGLUMINE 529 MG/ML IV SOLN,  Comparison:   None.  MRI HEAD  Findings:  A few small foci of hyperintensity T / FLAIR signal are seen involving the periventricular white matter.  Specifically, these are seen adjacent to the posterior horn of the right lateral ventricle (series 6, image 14), and adjacent to the lateral aspect of the left lateral ventricle (series 6, image 14).  The right- sided lesion is oriented perpendicular to the ventricles.  No juxta cortical lesions are identified.  No infratentorial lesions are seen.  These are nonspecific, but may be related to possible underlying demyelinating process given the patient's young age. A lesion located in the left centrum semi ovale demonstrates enhancement on post contrast sequences ( series 3, image 34).  No other enhancing lesions identified.  The CSF containing spaces are normal without evidence of hydrocephalous.  There is no acute intracranial hemorrhage or infarct.  No diffusion weighted signal abnormality.  No extra-axial fluid collection.  The midline structures are intact.  Corpus callosum is normal. Pituitary gland and pituitary stalk are normal.  The craniocervical junction is within normal limits.  IMPRESSION: Periventricular T2 / FLAIR hyperintense lesions.  These findings are most consistent with multiple sclerosis. Subtly enhancing lesion within the left centrum semiovale most likely reflects active demyelination.  MRI CERVICAL SPINE  Findings: The vertebral bodies are normally aligned with preservation of the normal cervical lordosis.  The vertebral body heights are preserved.  Signal intensity from the vertebral body bone marrow is normal.  There is no significant canal or neural foraminal stenosis identified.  Patchy T2 hyperintense signal intensity is  seen within the dorsal aspect of the cord at the level of the the craniocervical junction and C1, as well as distally at C5-6.  A 7 mm mm enhancing lesion within the dorsal aspect of the spinal cord at the level of C1 (series 8, image 10).  A second 4 mm enhancing lesion seen within the dorsal aspect of the cord. These findings are consistent with underlying demyelinating process such as multiple sclerosis.  IMPRESSION:  Patchy T2 hyperintense signal intensity at the craniocervical junction and C5-6, worrisome for multiple sclerosis.  Enhancing lesions at C1 and C6 likely reflect active demyelination.   Original Report Authenticated By: Rise Mu, M.D.     Assessment and Plan: PARYS ELENBAAS is a 27 y.o. year old female presenting with MS  MS: Management plan per neuro  recs. High-dose Solu-Medrol at 500 mg IV every 12. Plan for LP given questionable nuchal rigidity. Discussed plan with neurology. No current clinical indications for antibiotics. Will defer to neuro.  Anemia: Likely chronic iron deficiency. We'll check anemia panel. No active signs of bleeding.  Chest discomfort: Likely secondary to anxiety. Very atypical. However, will cycle cardiac enzymes. EKG x1. No chest pain currently.   FEN/GI: Heart healthy diet Prophylaxis: PPI Disposition: Pending further Code Status: Full code       Doree Albee MD  Pager: [phone]

## 2013-02-02 NOTE — Care Management Note (Signed)
   CARE MANAGEMENT NOTE 02/02/2013  Patient:  [name], [name]   Account Number:  [id number]  Date Initiated:  02/02/2013  Documentation initiated by:  Mistey Hoffert  Subjective/Objective Assessment:   27 yo female admitted with acute flare of multiple sclerosis.     Action/Plan:   Home when stable   Anticipated DC Date:     Anticipated DC Plan:  HOME/SELF CARE      DC Planning Services  CM consult      Choice offered to / List presented to:  NA   DME arranged  NA      DME agency  NA     HH arranged  NA      HH agency  NA   Status of service:  Completed, signed off Medicare Important Message given?   (If response is "NO", the following Medicare IM given date fields will be blank) Date Medicare IM given:   Date Additional Medicare IM given:    Discharge Disposition:    Per UR Regulation:  Reviewed for med. necessity/level of care/duration of stay  If discussed at Long Length of Stay Meetings, dates discussed:    Comments:  02/02/13 1146 Serenitee Fuertes,RN,MSN [phone] Chart reviewed. PT recommends no further PT follow-up. NO PCP on record. Cm to provide pt with information concerning [facility] & other PCP providers within payor network.

## 2013-02-02 NOTE — Progress Notes (Signed)
CRITICAL VALUE ALERT  Critical value received:  CSF WBC-13.0  Date of notification:  02/02/13  Time of notification:  1130  Critical value read back:yes  Nurse who received alert:  Farley Ly  MD notified (1st page):  [place]  Time of first page:  1130

## 2013-02-02 NOTE — Procedures (Signed)
Lumbar puncture under fluoro guidance. Opening pressure 13 cm water. 11 cc clear csf collected.

## 2013-02-02 NOTE — Consult Note (Signed)
Neurology Consultation Reason for Consult: Lower extremity numbness Referring Physician: Zettie Cooley  CC: Lower extremity numbness  History is obtained from: Patient  HPI: RAYNA BRENNER is a 27 y.o. female with numbness of the lower extremities for the past week. She states that it recently got worse, and now when she looks down she has a sharp shooting pain that shoots down her legs. 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 She denies any previous episodes of numbness or weakness. She denies any history of vision loss. She denies any problems with her arms. She does feel subjectively that her legs have been weaker.  ROS: A 14 point ROS was performed and is negative except as noted in the HPI.  Past Medical History  Diagnosis Date  . Preterm labor   . Gonorrhea   . Abnormal Pap smear     Colpo>normal  . Urinary tract infection   . Ovarian cyst     Family History: No history of multiple sclerosis or other autoimmune conditions Positive for diabetes  Social History: Tob: Denies  Exam: Current vital signs: BP 154/105  Pulse 80  Temp(Src) 99 F (37.2 C) (Oral)  Resp 18  SpO2 100%  LMP 01/22/2013 Vital signs in last 24 hours: Temp:  [99 F (37.2 C)] 99 F (37.2 C) (08/20 1845) Pulse Rate:  [80] 80 (08/20 1845) Resp:  [18] 18 (08/20 1845) BP: (154)/(105) 154/105 mmHg (08/20 1845) SpO2:  [100 %] 100 % (08/20 1845)  General: In bed, NAD CV: Regular rate and rhythm Mental Status: Patient is awake, alert, oriented to person, place, month, year, and situation. Immediate and remote memory are intact. Patient is able to give a clear and coherent history. Able to spell world backwards without difficulty Able to give # of quarters in $2.75 Cranial Nerves: II: Visual Fields are full. Pupils are equal, round, and reactive to light.  Discs are difficult to visualize. III,IV, VI: EOMI without ptosis or diploplia.  V:  Facial sensation is symmetric to temperature VII: Facial movement is symmetric.  VIII: hearing is intact to voice X: Uvula elevates symmetrically XI: Shoulder shrug is symmetric. XII: tongue is midline without atrophy or fasciculations.  Motor: Tone is normal. Bulk is normal. 5/5 strength was present in all four extremities including proximal legs as well as distal eversion and eversion of the feet. Sensory: Sensation is symmetric to light touch and temperature in the arms. I nt he legs, she has paresthesia to palpation below the knees bilaterally, but is able to sense touch and has intact propriocetpion.  Deep Tendon Reflexes: 3+ in the arms and legs.   Cerebellar: FNF intact bialterally Gait: Patient has a stable casual gait.   I have reviewed labs in epic and the results pertinent to this consultation are: CMP and CBC unremarkable.   I have reviewed the images obtained: MRI brain and C-spine - there are T2 lesions both in bed and hands as well as nonenhancing lesions. These involve both the C-spine as well as brain.  Impression: 27 year old female with numbness of her lower extremities likely attributable to her C-spine enhancing lesion. Given the presence of both enhancing as well as nonenhancing lesions of the brain and spinal cord, multiple sclerosis is by far the most likely diagnosis. He may be helpful to check for oligoclonal bands as well and her CSF, but for now I would treat as an acute multiple sclerosis flare.  Given that her  symptoms are preventing her from a, she her job, I would favor admitting for IV steroids and physical therapy evaluation.  Recommendations: 1) high-dose Solu-Medrol, 500 mg twice a day x3 days 2) physical therapy evaluation 3)  Lumbar puncture for cells, protein glucose, oligoclonal bands, IgG index   Ritta Slot, MD [facility] [phone]  If 7pm- 7am, please page neurology on call at [phone].

## 2013-02-02 NOTE — Evaluation (Signed)
Physical Therapy Evaluation Patient Details Name: [name] MRN: [id number] DOB: April 21, 1986 Today's Date: 02/02/2013 Time: 7829-5621 PT Time Calculation (min): 9 min  PT Assessment / Plan / Recommendation History of Present Illness  History of Present Illness:This is a 27 y.o. year old female admitted 02/01/13 presenting with ttingling and numbness, weakness ofboth legs.. Patient has noticed recurrent episodes of distal extremity tingling numbness and weakness. Patient is also has a mild numbness in her hands though she's not sure this is related to her driving.. Pt has new diagnosis of possible multiple sclerosis.  Clinical Impression  Evaluation cut short  For trip to radiology for LP. Will further  Evaluate next visit, although pt is  Ambulating without an assistive device. Pt does request a RW for home in the event that she would need it.Pt will benefit from PT while in acute care. No f/u PT indicated after DC at this time.    PT Assessment  Patient needs continued PT services    Follow Up Recommendations  No PT follow up    Does the patient have the potential to tolerate intense rehabilitation      Barriers to Discharge        Equipment Recommendations  Rolling walker with 5" wheels (pt requests for DC.)    Recommendations for Other Services     Frequency Min 3X/week    Precautions / Restrictions Precautions Precautions: Fall Restrictions Weight Bearing Restrictions: No   Pertinent Vitals/Pain Reports pain related to tingling of feet.      Mobility  Bed Mobility Bed Mobility: Supine to Sit;Sit to Supine Supine to Sit: 7: Independent Sit to Supine: 7: Independent Transfers Transfers: Sit to Stand;Stand to Sit Sit to Stand: 5: Supervision Stand to Sit: 7: Independent Ambulation/Gait Ambulation/Gait Assistance: 5: Supervision Ambulation Distance (Feet): 50 Feet Assistive device: None Ambulation/Gait Assistance Details: Pt noted to  have weight tendency on   lateral R foot during  stance. Gait Pattern: Step-through pattern    Exercises     PT Diagnosis: Abnormality of gait  PT Problem List: Impaired sensation PT Treatment Interventions: DME instruction;Gait training;Stair training;Functional mobility training;Therapeutic exercise     PT Goals(Current goals can be found in the care plan section) Acute Rehab PT Goals Patient Stated Goal: I want to not have this tingling. PT Goal Formulation: With patient/family Time For Goal Achievement: 02/16/13 Potential to Achieve Goals: Good  Visit Information  Last PT Received On: 02/02/13 Assistance Needed: +1 History of Present Illness: History of Present Illness:This is a 27 y.o. year old female admitted 02/01/13 presenting with ttingling and numbness, weakness ofboth legs.. Patient has noticed recurrent episodes of distal extremity tingling numbness and weakness. Patient is also has a mild numbness in her hands though she's not sure this is related to her driving.. Pt has new diagnosis of possible multiple sclerosis.       Prior Functioning  Home Living Family/patient expects to be discharged to:: Private residence Living Arrangements: Spouse/significant other Available Help at Discharge: Family Type of Home: House Home Access: Stairs to enter Secretary/administrator of Steps: 3 Entrance Stairs-Rails: None Home Layout: One level Home Equipment: None Prior Function Level of Independence: Independent Comments: drives a bus. Communication Communication: No difficulties    Cognition  Cognition Arousal/Alertness: Awake/alert Behavior During Therapy: WFL for tasks assessed/performed Overall Cognitive Status: Within Functional Limits for tasks assessed    Extremity/Trunk Assessment Upper Extremity Assessment Upper Extremity Assessment: Overall WFL for tasks assessed Lower Extremity Assessment Lower Extremity Assessment:  RLE deficits/detail;LLE deficits/detail RLE Deficits / Details:  strength WFL, relates that foot and leg feels like it is asleep and waking up constnatly. LLE Deficits / Details: same as R Cervical / Trunk Assessment Cervical / Trunk Assessment: Normal   Balance Balance Balance Assessed: Yes Static Standing Balance Static Standing - Balance Support: No upper extremity supported Static Standing - Level of Assistance: 7: Independent Dynamic Standing Balance Dynamic Standing - Balance Support: No upper extremity supported Dynamic Standing - Level of Assistance: 5: Stand by assistance  End of Session PT - End of Session Activity Tolerance: Patient tolerated treatment well Patient left: in bed;with family/visitor present Nurse Communication: Mobility status  GP     Rada Hay 02/02/2013, 9:09 AM Blanchard Kelch PT [phone]

## 2013-02-02 NOTE — Progress Notes (Signed)
[facility] PROGRESS NOTE  [name] WUJ:[id number] DOB: 12-Nov-1985 DOA: 14-Feb-2013 PCP: No primary provider on file.  Assessment/Plan: 1-MS: Continue with IV solumedrol. Follow CSF fluids results. CSF with 13 WBC. No fever or clinical evidence of meningitis. Will hold on IV antibiotic, discussed with Dr Lu Duffel. If patient spike fever will start antibiotics.  2-Anemia: Likely chronic iron deficiency. anemia panel pending. No active signs of bleeding.  3-Chest discomfort: Very atypical. cardiac enzymes times 2 negative.. EKG x1. No chest pain currently.   Code Status: Full Family Communication:  Disposition Plan:    Consultants:  Neurology  Procedures:  LP 8-21  Antibiotics: none   HPI/Subjective: She is lying in bed. She just came from LP. She is still complaining of lower extremities numbness.  She was alert, appropriate.   Objective: Filed Vitals:   02/02/13 0604  BP: 115/63  Pulse: 84  Temp: 97.9 F (36.6 C)  Resp: 18    Intake/Output Summary (Last 24 hours) at 02/02/13 1004 Last data filed at 02/02/13 0200  Gross per 24 hour  Intake      0 ml  Output    100 ml  Net   -100 ml   Filed Weights   02/02/13 0200  Weight: 100.426 kg (221 lb 6.4 oz)    Exam:   General:  No distress.   Cardiovascular: S 1, S 2 RRR  Respiratory: CTA  Abdomen: Bs present, soft, NT  Musculoskeletal: no edema.   Data Reviewed: Basic Metabolic Panel:  Recent Labs Lab February 14, 2013 2015 02/02/13 0705  NA 138 135  K 3.8 3.9  CL 102 99  CO2 27 25  GLUCOSE 85 132*  BUN 9 9  CREATININE 0.90 0.84  CALCIUM 9.4 9.6  MG 1.8  --    Liver Function Tests:  Recent Labs Lab 2013-02-14 2015 02/02/13 0705  AST 18 16  ALT 21 19  ALKPHOS 42 46  BILITOT 0.7 0.7  PROT 7.3 7.5  ALBUMIN 3.9 3.9   No results found for this basename: LIPASE, AMYLASE,  in the last 168 hours No results found for this basename: AMMONIA,  in the last 168 hours CBC:  Recent Labs Lab  02-14-13 2015 02/02/13 0705  WBC 7.3 6.8  NEUTROABS 3.7  --   HGB 11.2* 12.3  HCT 35.2* 37.9  MCV 93.1 91.8  PLT 384 404*   Cardiac Enzymes:  Recent Labs Lab 02/02/13 0135 02/02/13 0705  TROPONINI <0.30 <0.30   BNP (last 3 results) No results found for this basename: PROBNP,  in the last 8760 hours CBG: No results found for this basename: GLUCAP,  in the last 168 hours  No results found for this or any previous visit (from the past 240 hour(s)).   Studies: Mr Laqueta Jean Wo Contrast  Feb 14, 2013   *RADIOLOGY REPORT*  Clinical Data:  Bilateral lower extremity numbness and tingling  MRI HEAD WITHOUT AND WITH CONTRAST MRI CERVICAL SPINE WITHOUT AND WITH CONTRAST  Technique:  Multiplanar, multiecho pulse sequences of the brain and surrounding structures, and cervical spine, to include the craniocervical junction and cervicothoracic junction, were obtained without and with intravenous contrast.  Contrast: 20mL MULTIHANCE GADOBENATE DIMEGLUMINE 529 MG/ML IV SOLN,  Comparison:   None.  MRI HEAD  Findings:  A few small foci of hyperintensity T / FLAIR signal are seen involving the periventricular white matter.  Specifically, these are seen adjacent to the posterior horn of the right lateral ventricle (series 6, image 14), and adjacent to  the lateral aspect of the left lateral ventricle (series 6, image 14).  The right- sided lesion is oriented perpendicular to the ventricles.  No juxta cortical lesions are identified.  No infratentorial lesions are seen.  These are nonspecific, but may be related to possible underlying demyelinating process given the patient's young age. A lesion located in the left centrum semi ovale demonstrates enhancement on post contrast sequences ( series 3, image 34).  No other enhancing lesions identified.  The CSF containing spaces are normal without evidence of hydrocephalous.  There is no acute intracranial hemorrhage or infarct.  No diffusion weighted signal abnormality.   No extra-axial fluid collection.  The midline structures are intact.  Corpus callosum is normal. Pituitary gland and pituitary stalk are normal.  The craniocervical junction is within normal limits.  IMPRESSION: Periventricular T2 / FLAIR hyperintense lesions.  These findings are most consistent with multiple sclerosis. Subtly enhancing lesion within the left centrum semiovale most likely reflects active demyelination.  MRI CERVICAL SPINE  Findings: The vertebral bodies are normally aligned with preservation of the normal cervical lordosis.  The vertebral body heights are preserved.  Signal intensity from the vertebral body bone marrow is normal.  There is no significant canal or neural foraminal stenosis identified.  Patchy T2 hyperintense signal intensity is seen within the dorsal aspect of the cord at the level of the the craniocervical junction and C1, as well as distally at C5-6.  A 7 mm mm enhancing lesion within the dorsal aspect of the spinal cord at the level of C1 (series 8, image 10).  A second 4 mm enhancing lesion seen within the dorsal aspect of the cord. These findings are consistent with underlying demyelinating process such as multiple sclerosis.  IMPRESSION:  Patchy T2 hyperintense signal intensity at the craniocervical junction and C5-6, worrisome for multiple sclerosis.  Enhancing lesions at C1 and C6 likely reflect active demyelination.   Original Report Authenticated By: Rise Mu, M.D.   Mr Cervical Spine W Wo Contrast  02/01/2013   *RADIOLOGY REPORT*  Clinical Data:  Bilateral lower extremity numbness and tingling  MRI HEAD WITHOUT AND WITH CONTRAST MRI CERVICAL SPINE WITHOUT AND WITH CONTRAST  Technique:  Multiplanar, multiecho pulse sequences of the brain and surrounding structures, and cervical spine, to include the craniocervical junction and cervicothoracic junction, were obtained without and with intravenous contrast.  Contrast: 20mL MULTIHANCE GADOBENATE DIMEGLUMINE 529  MG/ML IV SOLN,  Comparison:   None.  MRI HEAD  Findings:  A few small foci of hyperintensity T / FLAIR signal are seen involving the periventricular white matter.  Specifically, these are seen adjacent to the posterior horn of the right lateral ventricle (series 6, image 14), and adjacent to the lateral aspect of the left lateral ventricle (series 6, image 14).  The right- sided lesion is oriented perpendicular to the ventricles.  No juxta cortical lesions are identified.  No infratentorial lesions are seen.  These are nonspecific, but may be related to possible underlying demyelinating process given the patient's young age. A lesion located in the left centrum semi ovale demonstrates enhancement on post contrast sequences ( series 3, image 34).  No other enhancing lesions identified.  The CSF containing spaces are normal without evidence of hydrocephalous.  There is no acute intracranial hemorrhage or infarct.  No diffusion weighted signal abnormality.  No extra-axial fluid collection.  The midline structures are intact.  Corpus callosum is normal. Pituitary gland and pituitary stalk are normal.  The craniocervical junction is  within normal limits.  IMPRESSION: Periventricular T2 / FLAIR hyperintense lesions.  These findings are most consistent with multiple sclerosis. Subtly enhancing lesion within the left centrum semiovale most likely reflects active demyelination.  MRI CERVICAL SPINE  Findings: The vertebral bodies are normally aligned with preservation of the normal cervical lordosis.  The vertebral body heights are preserved.  Signal intensity from the vertebral body bone marrow is normal.  There is no significant canal or neural foraminal stenosis identified.  Patchy T2 hyperintense signal intensity is seen within the dorsal aspect of the cord at the level of the the craniocervical junction and C1, as well as distally at C5-6.  A 7 mm mm enhancing lesion within the dorsal aspect of the spinal cord at the  level of C1 (series 8, image 10).  A second 4 mm enhancing lesion seen within the dorsal aspect of the cord. These findings are consistent with underlying demyelinating process such as multiple sclerosis.  IMPRESSION:  Patchy T2 hyperintense signal intensity at the craniocervical junction and C5-6, worrisome for multiple sclerosis.  Enhancing lesions at C1 and C6 likely reflect active demyelination.   Original Report Authenticated By: Rise Mu, M.D.    Scheduled Meds: . [START ON 02/03/2013] heparin  5,000 Units Subcutaneous Q8H  . methylPREDNISolone (SOLU-MEDROL) injection  500 mg Intravenous Q12H   Continuous Infusions:   Active Problems:   * No active hospital problems. *    Time spent: 35 minutes.     Larie Mathes  [facility] Pager [phone]. If 7PM-7AM, please contact night-coverage at [email or web address], password [facility] 02/02/2013, 10:04 AM  LOS: 1 day

## 2013-02-03 DIAGNOSIS — E669 Obesity, unspecified: Secondary | ICD-10-CM

## 2013-02-03 LAB — URINALYSIS, ROUTINE W REFLEX MICROSCOPIC
Leukocytes, UA: NEGATIVE
Nitrite: NEGATIVE
Protein, ur: NEGATIVE mg/dL
Urobilinogen, UA: 0.2 mg/dL (ref 0.0–1.0)

## 2013-02-03 MED ORDER — ENOXAPARIN SODIUM 60 MG/0.6ML ~~LOC~~ SOLN
50.0000 mg | Freq: Every day | SUBCUTANEOUS | Status: DC
  Administered 2013-02-04: 50 mg via SUBCUTANEOUS
  Filled 2013-02-03: qty 0.6

## 2013-02-03 MED ORDER — SENNOSIDES-DOCUSATE SODIUM 8.6-50 MG PO TABS
1.0000 | ORAL_TABLET | Freq: Two times a day (BID) | ORAL | Status: DC
  Administered 2013-02-03 – 2013-02-04 (×3): 1 via ORAL
  Filled 2013-02-03 (×4): qty 1

## 2013-02-03 MED ORDER — HYDROCODONE-ACETAMINOPHEN 5-325 MG PO TABS
1.0000 | ORAL_TABLET | Freq: Four times a day (QID) | ORAL | Status: DC | PRN
  Administered 2013-02-03 – 2013-02-04 (×4): 1 via ORAL
  Filled 2013-02-03 (×4): qty 1

## 2013-02-03 MED ORDER — ENOXAPARIN SODIUM 40 MG/0.4ML ~~LOC~~ SOLN
40.0000 mg | SUBCUTANEOUS | Status: DC
  Administered 2013-02-03: 40 mg via SUBCUTANEOUS
  Filled 2013-02-03: qty 0.4

## 2013-02-03 NOTE — Progress Notes (Signed)
Physical Therapy Treatment Patient Details Name: [name] MRN: [id number] DOB: [birth year] Today's Date: 02/03/2013 Time: 6962-9528 PT Time Calculation (min): 25 min  PT Assessment / Plan / Recommendation  History of Present Illness pt reports she has pain in legs and has recently had a vicodin to help control the pain   PT Comments   Pt sleepy from pain med, but able to participate with exercise and begin to isolate core activation.  She understood that she would benefit from increasing core strength, but admits that committing to exercise program will be difficulty for her.  Pt said she could add some simple core activation and one legged standing activities into her daily life.  If she does not progress well, she will ask neuro MD for outpatient PT prescription.   Follow Up Recommendations  No PT follow up (pt to ask neuro md for outpt appt if she wants it)     Does the patient have the potential to tolerate intense rehabilitation     Barriers to Discharge        Equipment Recommendations       Recommendations for Other Services    Frequency Min 3X/week   Progress towards PT Goals Progress towards PT goals: Progressing toward goals  Plan Current plan remains appropriate    Precautions / Restrictions Precautions Precautions: Fall   Pertinent Vitals/Pain Pt with burning pain in LEs    Mobility  Bed Mobility Bed Mobility: Supine to Sit;Sit to Supine Supine to Sit: 7: Independent Sit to Supine: 7: Independent Transfers Transfers: Sit to Stand;Stand to Sit Sit to Stand: 5: Supervision Stand to Sit: 7: Independent Ambulation/Gait Ambulation/Gait Assistance: 5: Supervision Ambulation Distance (Feet): 20 Feet Assistive device: None Ambulation/Gait Assistance Details: safety Gait Pattern: Step-through pattern General Gait Details: Noted to have some unsteadiness in gait due to ? (pain med?)  Pt continues to request RW for home    Exercises General Exercises - Lower  Extremity Gluteal Sets: AROM;Both;5 reps;Standing;Supine Hip ABduction/ADduction: AROM;Both;5 reps;Standing Hip Flexion/Marching: AROM;Strengthening;Both;Supine;Standing;10 reps Heel Raises: AROM;Both;5 reps;Seated Other Exercises Other Exercises: Instructed pt on core activation and to "set" abdominal muscles to prepare fo extremity exercise Other Exercises: abd sets, bridges, knee to chest - all with emphasis on abdominal activation and core stability by keeping pelvis level Other Exercises: Instructed pt to try to include exercise activity in her day, but she states she is very busy with her 4 kids Other Exercises: pt given a flyer for community Pilates exercise at [facility] Other Exercises: Pt given a written copy of simple stretching exercises to do with core activation   PT Diagnosis:    PT Problem List:   PT Treatment Interventions:     PT Goals (current goals can now be found in the care plan section)    Visit Information  Last PT Received On: 02/03/13 Assistance Needed: +1 History of Present Illness: pt reports she has pain in legs and has recently had a vicodin to help control the pain    Subjective Data      Cognition  Cognition Arousal/Alertness: Awake/alert Behavior During Therapy: WFL for tasks assessed/performed Overall Cognitive Status: Within Functional Limits for tasks assessed    Balance  Balance Balance Assessed: Yes Static Standing Balance Static Standing - Balance Support: No upper extremity supported Static Standing - Level of Assistance: 7: Independent Dynamic Standing Balance Dynamic Standing - Balance Support: No upper extremity supported Dynamic Standing - Level of Assistance: 5: Stand by assistance  End  of Session PT - End of Session Activity Tolerance: Patient limited by fatigue;Patient limited by pain Patient left: in bed;with call bell/phone within reach Nurse Communication: Mobility status   GP    Bayard Hugger. [place],  [employer] [phone] 02/03/2013, 1:06 PM

## 2013-02-03 NOTE — Progress Notes (Signed)
NEURO HOSPITALIST PROGRESS NOTE   SUBJECTIVE:                                                                                                                        Patient has had 3 doses of steroids and feels no change in her symptoms. Now stating she has a sensation of burning in bilateral knees to feet and bilateral forearms.   OBJECTIVE:                                                                                                                           Vital signs in last 24 hours: Temp:  [97.6 F (36.4 C)-97.7 F (36.5 C)] 97.7 F (36.5 C) (08/22 0552) Pulse Rate:  [62-71] 62 (08/22 0552) Resp:  [16-18] 16 (08/22 0552) BP: (110-132)/(62-80) 110/64 mmHg (08/22 0552) SpO2:  [99 %-100 %] 99 % (08/22 0552)  Intake/Output from previous day: 08/21 0701 - 08/22 0700 In: 762 [P.O.:600; IV Piggyback:162] Out: 1000 [Urine:1000] Intake/Output this shift:   Nutritional status: General  Past Medical History  Diagnosis Date  . Preterm labor   . Gonorrhea   . Abnormal Pap smear     Colpo>normal  . Urinary tract infection   . Ovarian cyst   . Neuromuscular disorder     diagnosed with MS this visit     Neurologic Exam:   Mental Status: Alert, oriented, thought content appropriate.  Speech fluent without evidence of aphasia.  Able to follow 3 step commands without difficulty. Cranial Nerves: II: Discs flat bilaterally; Visual fields grossly normal, pupils equal, round, reactive to light and accommodation III,IV, VI: ptosis not present, extra-ocular motions intact bilaterally V,VII: smile symmetric, facial light touch sensation normal bilaterally VIII: hearing normal bilaterally IX,X: gag reflex present XI: bilateral shoulder shrug XII: midline tongue extension Motor: Right : Upper extremity   5/5    Left:     Upper extremity   5/5  Lower extremity   5/5     Lower extremity   5/5 Tone and bulk:normal tone throughout; no atrophy  noted Sensory: Pinprick and light touch intact throughout, bilaterally. No decreased sensation today Deep Tendon Reflexes:  Right: Upper Extremity   Left: Upper extremity   3+ bilaterally  Lower Extremity Lower Extremity  quadriceps (L-2 to L-4) 3/4   quadriceps (L-2 to L-4) 3/4 Achilles (S1) 2/4   Achilles (S1) 2/4  Plantars: Right: downgoing   Left: downgoing Cerebellar: normal finger-to-nose,  normal heel-to-shin test CV: pulses palpable throughout   Lab Results: No results found for this basename: cbc, bmp, coags, chol, tri, ldl, hga1c   Lipid Panel No results found for this basename: CHOL, TRIG, HDL, CHOLHDL, VLDL, LDLCALC,  in the last 72 hours  Studies/Results: Mr Laqueta Jean Wo Contrast  02/01/2013   *RADIOLOGY REPORT*  Clinical Data:  Bilateral lower extremity numbness and tingling  MRI HEAD WITHOUT AND WITH CONTRAST MRI CERVICAL SPINE WITHOUT AND WITH CONTRAST  Technique:  Multiplanar, multiecho pulse sequences of the brain and surrounding structures, and cervical spine, to include the craniocervical junction and cervicothoracic junction, were obtained without and with intravenous contrast.  Contrast: 20mL MULTIHANCE GADOBENATE DIMEGLUMINE 529 MG/ML IV SOLN,  Comparison:   None.  MRI HEAD  Findings:  A few small foci of hyperintensity T / FLAIR signal are seen involving the periventricular white matter.  Specifically, these are seen adjacent to the posterior horn of the right lateral ventricle (series 6, image 14), and adjacent to the lateral aspect of the left lateral ventricle (series 6, image 14).  The right- sided lesion is oriented perpendicular to the ventricles.  No juxta cortical lesions are identified.  No infratentorial lesions are seen.  These are nonspecific, but may be related to possible underlying demyelinating process given the patient's young age. A lesion located in the left centrum semi ovale demonstrates enhancement on post contrast sequences ( series 3, image 34).   No other enhancing lesions identified.  The CSF containing spaces are normal without evidence of hydrocephalous.  There is no acute intracranial hemorrhage or infarct.  No diffusion weighted signal abnormality.  No extra-axial fluid collection.  The midline structures are intact.  Corpus callosum is normal. Pituitary gland and pituitary stalk are normal.  The craniocervical junction is within normal limits.  IMPRESSION: Periventricular T2 / FLAIR hyperintense lesions.  These findings are most consistent with multiple sclerosis. Subtly enhancing lesion within the left centrum semiovale most likely reflects active demyelination.  MRI CERVICAL SPINE  Findings: The vertebral bodies are normally aligned with preservation of the normal cervical lordosis.  The vertebral body heights are preserved.  Signal intensity from the vertebral body bone marrow is normal.  There is no significant canal or neural foraminal stenosis identified.  Patchy T2 hyperintense signal intensity is seen within the dorsal aspect of the cord at the level of the the craniocervical junction and C1, as well as distally at C5-6.  A 7 mm mm enhancing lesion within the dorsal aspect of the spinal cord at the level of C1 (series 8, image 10).  A second 4 mm enhancing lesion seen within the dorsal aspect of the cord. These findings are consistent with underlying demyelinating process such as multiple sclerosis.  IMPRESSION:  Patchy T2 hyperintense signal intensity at the craniocervical junction and C5-6, worrisome for multiple sclerosis.  Enhancing lesions at C1 and C6 likely reflect active demyelination.   Original Report Authenticated By: Rise Mu, M.D.   Mr Cervical Spine W Wo Contrast  02/01/2013   *RADIOLOGY REPORT*  Clinical Data:  Bilateral lower extremity numbness and tingling  MRI HEAD WITHOUT AND WITH CONTRAST MRI CERVICAL SPINE WITHOUT AND WITH CONTRAST  Technique:  Multiplanar, multiecho pulse sequences of the brain and  surrounding structures, and  cervical spine, to include the craniocervical junction and cervicothoracic junction, were obtained without and with intravenous contrast.  Contrast: 20mL MULTIHANCE GADOBENATE DIMEGLUMINE 529 MG/ML IV SOLN,  Comparison:   None.  MRI HEAD  Findings:  A few small foci of hyperintensity T / FLAIR signal are seen involving the periventricular white matter.  Specifically, these are seen adjacent to the posterior horn of the right lateral ventricle (series 6, image 14), and adjacent to the lateral aspect of the left lateral ventricle (series 6, image 14).  The right- sided lesion is oriented perpendicular to the ventricles.  No juxta cortical lesions are identified.  No infratentorial lesions are seen.  These are nonspecific, but may be related to possible underlying demyelinating process given the patient's young age. A lesion located in the left centrum semi ovale demonstrates enhancement on post contrast sequences ( series 3, image 34).  No other enhancing lesions identified.  The CSF containing spaces are normal without evidence of hydrocephalous.  There is no acute intracranial hemorrhage or infarct.  No diffusion weighted signal abnormality.  No extra-axial fluid collection.  The midline structures are intact.  Corpus callosum is normal. Pituitary gland and pituitary stalk are normal.  The craniocervical junction is within normal limits.  IMPRESSION: Periventricular T2 / FLAIR hyperintense lesions.  These findings are most consistent with multiple sclerosis. Subtly enhancing lesion within the left centrum semiovale most likely reflects active demyelination.  MRI CERVICAL SPINE  Findings: The vertebral bodies are normally aligned with preservation of the normal cervical lordosis.  The vertebral body heights are preserved.  Signal intensity from the vertebral body bone marrow is normal.  There is no significant canal or neural foraminal stenosis identified.  Patchy T2 hyperintense signal  intensity is seen within the dorsal aspect of the cord at the level of the the craniocervical junction and C1, as well as distally at C5-6.  A 7 mm mm enhancing lesion within the dorsal aspect of the spinal cord at the level of C1 (series 8, image 10).  A second 4 mm enhancing lesion seen within the dorsal aspect of the cord. These findings are consistent with underlying demyelinating process such as multiple sclerosis.  IMPRESSION:  Patchy T2 hyperintense signal intensity at the craniocervical junction and C5-6, worrisome for multiple sclerosis.  Enhancing lesions at C1 and C6 likely reflect active demyelination.   Original Report Authenticated By: Rise Mu, M.D.   Dg Fluoro Guide Lumbar Puncture  02/02/2013   CLINICAL DATA:  Evaluate for multiple sclerosis.  EXAM: DIAGNOSTIC LUMBAR PUNCTURE UNDER FLUOROSCOPIC GUIDANCE  FLUOROSCOPY TIME:  0 min, 18 seconds.  PROCEDURE: Informed consent was obtained from the patient prior to the procedure, including potential complications of headache, allergy, and pain. With the patient prone, the lower back was prepped with Betadine. 1% Lidocaine was used for local anesthesia. Lumbar puncture was performed at the L2-3 level using a gauge needle with return of clearCSF with an opening pressure of cm water. 11ml of CSF were obtained for laboratory studies. The patient tolerated the procedure well and there were no apparent complications.  IMPRESSION: Technically successful lumbar puncture with fluoroscopic guidance.   Electronically Signed   By: Charlett Nose   On: 02/02/2013 10:08    MEDICATIONS  Scheduled: . methylPREDNISolone (SOLU-MEDROL) injection  500 mg Intravenous Q12H    ASSESSMENT/PLAN:                                                                                                            Impression: 27 year old female with  numbness of her lower extremities likely attributable to her C-spine enhancing lesion. Given the presence of both enhancing as well as nonenhancing lesions of the brain and spinal cord, multiple sclerosis is by far the most likely diagnosis.  LP demonstrated 13 WBC but no other abnormalities in addition patient is afebrile, showing no signes of meningismus or infection. No ABX or antivirals has been recommended. Patient has received 3 doses of steroids of total 6 doses with no subjective sensation of improvement.   Recommend: 1) Continue steroid treatment 2) PT/OT 3) will follow IgG and oligoclonal bands 4) Will need neurology follow up as out patient    Assessment and plan discussed with with attending physician and they are in agreement.    Felicie Morn PA-C [facility] [phone]  02/03/2013, 9:10 AM

## 2013-02-03 NOTE — Progress Notes (Signed)
OT Cancellation Note  Patient Details Name: [name] MRN: [id number] DOB: May 12, 1986   Cancelled Treatment:    Reason Eval/Treat Not Completed: OT screened, no needs identified, will sign off  Pt feels she will be independent with adls.  Has a high commode and will sponge bathe if she doesn't feel she has the energy for a shower in the tub.  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  Educated that she should hold this in her palm and keep her eyes attending to grip.    Micky Sheller 02/03/2013, 1:52 PM Marica Otter, OTR/L [phone] 02/03/2013

## 2013-02-03 NOTE — Progress Notes (Addendum)
[facility] PROGRESS NOTE  [name] XBJ:[id number] DOB: 1985/08/02 DOA: 02/01/2013 PCP: No primary provider on file.  Assessment/Plan:  1-MS: Continue with IV solumedrol  Day 2. CSF with 13 WBC. No fever or clinical evidence of meningitis. Will hold on IV antibiotic, discussed with Dr Lu Duffel. If patient spike fever will start antibiotics. CSF oligoclonal pending.  -Will check Vitamin D level.   2-Anemia: Likely chronic iron deficiency. anemia panel: B 12 at 309, Folate 9.7, Ferritin at 6, iron 89.  will check MMA level, . No active signs of bleeding.   3-Chest discomfort: episode one month ago. Very atypical. cardiac enzymes times 2 negative.. EKG x1. No chest pain currently. D dimer negative.  4-Dysuria: check UA.   Code Status: Full Family Communication:  Disposition Plan:    Consultants:  Neurology  Procedures:  LP 8-21  Antibiotics: none   HPI/Subjective: Denies chest pain or dyspnea.  Still relates LE numbness, not worse.  Relates now LE pain.   Objective: Filed Vitals:   02/03/13 0552  BP: 110/64  Pulse: 62  Temp: 97.7 F (36.5 C)  Resp: 16    Intake/Output Summary (Last 24 hours) at 02/03/13 1038 Last data filed at 02/03/13 0611  Gross per 24 hour  Intake    762 ml  Output    500 ml  Net    262 ml   Filed Weights   02/02/13 0200  Weight: 100.426 kg (221 lb 6.4 oz)    Exam:   General:  No distress.   Cardiovascular: S 1, S 2 RRR  Respiratory: CTA  Abdomen: Bs present, soft, NT  Musculoskeletal: no edema.   Data Reviewed: Basic Metabolic Panel:  Recent Labs Lab 02/01/13 2015 02/02/13 0705  NA 138 135  K 3.8 3.9  CL 102 99  CO2 27 25  GLUCOSE 85 132*  BUN 9 9  CREATININE 0.90 0.84  CALCIUM 9.4 9.6  MG 1.8  --    Liver Function Tests:  Recent Labs Lab 02/01/13 2015 02/02/13 0705  AST 18 16  ALT 21 19  ALKPHOS 42 46  BILITOT 0.7 0.7  PROT 7.3 7.5  ALBUMIN 3.9 3.9   No results found for this basename:  LIPASE, AMYLASE,  in the last 168 hours No results found for this basename: AMMONIA,  in the last 168 hours CBC:  Recent Labs Lab 02/01/13 2015 02/02/13 0705  WBC 7.3 6.8  NEUTROABS 3.7  --   HGB 11.2* 12.3  HCT 35.2* 37.9  MCV 93.1 91.8  PLT 384 404*   Cardiac Enzymes:  Recent Labs Lab 02/02/13 0135 02/02/13 0705 02/02/13 1245  TROPONINI <0.30 <0.30 <0.30   BNP (last 3 results) No results found for this basename: PROBNP,  in the last 8760 hours CBG: No results found for this basename: GLUCAP,  in the last 168 hours  Recent Results (from the past 240 hour(s))  GRAM STAIN     Status: None   Collection Time    02/02/13  9:40 AM      Result Value Range Status   Specimen Description CSF   Final   Special Requests NONE   Final   Gram Stain     Final   Value: WBC SEEN     NO ORGANISMS SEEN     CYTOSPUN SMEAR     Gram Stain Report Called to,Read Back By and Verified WithMarland Kitchen Raelene Bott [id number] @ 1119 BY J SCOTTON   Report Status 02/02/2013 FINAL  Final     Studies: Mr Laqueta Jean Wo Contrast  Feb 27, 2013   *RADIOLOGY REPORT*  Clinical Data:  Bilateral lower extremity numbness and tingling  MRI HEAD WITHOUT AND WITH CONTRAST MRI CERVICAL SPINE WITHOUT AND WITH CONTRAST  Technique:  Multiplanar, multiecho pulse sequences of the brain and surrounding structures, and cervical spine, to include the craniocervical junction and cervicothoracic junction, were obtained without and with intravenous contrast.  Contrast: 20mL MULTIHANCE GADOBENATE DIMEGLUMINE 529 MG/ML IV SOLN,  Comparison:   None.  MRI HEAD  Findings:  A few small foci of hyperintensity T / FLAIR signal are seen involving the periventricular white matter.  Specifically, these are seen adjacent to the posterior horn of the right lateral ventricle (series 6, image 14), and adjacent to the lateral aspect of the left lateral ventricle (series 6, image 14).  The right- sided lesion is oriented perpendicular to the ventricles.  No  juxta cortical lesions are identified.  No infratentorial lesions are seen.  These are nonspecific, but may be related to possible underlying demyelinating process given the patient's young age. A lesion located in the left centrum semi ovale demonstrates enhancement on post contrast sequences ( series 3, image 34).  No other enhancing lesions identified.  The CSF containing spaces are normal without evidence of hydrocephalous.  There is no acute intracranial hemorrhage or infarct.  No diffusion weighted signal abnormality.  No extra-axial fluid collection.  The midline structures are intact.  Corpus callosum is normal. Pituitary gland and pituitary stalk are normal.  The craniocervical junction is within normal limits.  IMPRESSION: Periventricular T2 / FLAIR hyperintense lesions.  These findings are most consistent with multiple sclerosis. Subtly enhancing lesion within the left centrum semiovale most likely reflects active demyelination.  MRI CERVICAL SPINE  Findings: The vertebral bodies are normally aligned with preservation of the normal cervical lordosis.  The vertebral body heights are preserved.  Signal intensity from the vertebral body bone marrow is normal.  There is no significant canal or neural foraminal stenosis identified.  Patchy T2 hyperintense signal intensity is seen within the dorsal aspect of the cord at the level of the the craniocervical junction and C1, as well as distally at C5-6.  A 7 mm mm enhancing lesion within the dorsal aspect of the spinal cord at the level of C1 (series 8, image 10).  A second 4 mm enhancing lesion seen within the dorsal aspect of the cord. These findings are consistent with underlying demyelinating process such as multiple sclerosis.  IMPRESSION:  Patchy T2 hyperintense signal intensity at the craniocervical junction and C5-6, worrisome for multiple sclerosis.  Enhancing lesions at C1 and C6 likely reflect active demyelination.   Original Report Authenticated By:  Rise Mu, M.D.   Mr Cervical Spine W Wo Contrast  Feb 27, 2013   *RADIOLOGY REPORT*  Clinical Data:  Bilateral lower extremity numbness and tingling  MRI HEAD WITHOUT AND WITH CONTRAST MRI CERVICAL SPINE WITHOUT AND WITH CONTRAST  Technique:  Multiplanar, multiecho pulse sequences of the brain and surrounding structures, and cervical spine, to include the craniocervical junction and cervicothoracic junction, were obtained without and with intravenous contrast.  Contrast: 20mL MULTIHANCE GADOBENATE DIMEGLUMINE 529 MG/ML IV SOLN,  Comparison:   None.  MRI HEAD  Findings:  A few small foci of hyperintensity T / FLAIR signal are seen involving the periventricular white matter.  Specifically, these are seen adjacent to the posterior horn of the right lateral ventricle (series 6, image 14), and adjacent to the lateral  aspect of the left lateral ventricle (series 6, image 14).  The right- sided lesion is oriented perpendicular to the ventricles.  No juxta cortical lesions are identified.  No infratentorial lesions are seen.  These are nonspecific, but may be related to possible underlying demyelinating process given the patient's young age. A lesion located in the left centrum semi ovale demonstrates enhancement on post contrast sequences ( series 3, image 34).  No other enhancing lesions identified.  The CSF containing spaces are normal without evidence of hydrocephalous.  There is no acute intracranial hemorrhage or infarct.  No diffusion weighted signal abnormality.  No extra-axial fluid collection.  The midline structures are intact.  Corpus callosum is normal. Pituitary gland and pituitary stalk are normal.  The craniocervical junction is within normal limits.  IMPRESSION: Periventricular T2 / FLAIR hyperintense lesions.  These findings are most consistent with multiple sclerosis. Subtly enhancing lesion within the left centrum semiovale most likely reflects active demyelination.  MRI CERVICAL SPINE   Findings: The vertebral bodies are normally aligned with preservation of the normal cervical lordosis.  The vertebral body heights are preserved.  Signal intensity from the vertebral body bone marrow is normal.  There is no significant canal or neural foraminal stenosis identified.  Patchy T2 hyperintense signal intensity is seen within the dorsal aspect of the cord at the level of the the craniocervical junction and C1, as well as distally at C5-6.  A 7 mm mm enhancing lesion within the dorsal aspect of the spinal cord at the level of C1 (series 8, image 10).  A second 4 mm enhancing lesion seen within the dorsal aspect of the cord. These findings are consistent with underlying demyelinating process such as multiple sclerosis.  IMPRESSION:  Patchy T2 hyperintense signal intensity at the craniocervical junction and C5-6, worrisome for multiple sclerosis.  Enhancing lesions at C1 and C6 likely reflect active demyelination.   Original Report Authenticated By: Rise Mu, M.D.   Dg Fluoro Guide Lumbar Puncture  02/02/2013   CLINICAL DATA:  Evaluate for multiple sclerosis.  EXAM: DIAGNOSTIC LUMBAR PUNCTURE UNDER FLUOROSCOPIC GUIDANCE  FLUOROSCOPY TIME:  0 min, 18 seconds.  PROCEDURE: Informed consent was obtained from the patient prior to the procedure, including potential complications of headache, allergy, and pain. With the patient prone, the lower back was prepped with Betadine. 1% Lidocaine was used for local anesthesia. Lumbar puncture was performed at the L2-3 level using a gauge needle with return of clearCSF with an opening pressure of cm water. 11ml of CSF were obtained for laboratory studies. The patient tolerated the procedure well and there were no apparent complications.  IMPRESSION: Technically successful lumbar puncture with fluoroscopic guidance.   Electronically Signed   By: Charlett Nose   On: 02/02/2013 10:08    Scheduled Meds: . Melene Muller ON 02/04/2013] enoxaparin (LOVENOX) injection  50  mg Subcutaneous Daily  . methylPREDNISolone (SOLU-MEDROL) injection  500 mg Intravenous Q12H  . senna-docusate  1 tablet Oral BID   Continuous Infusions:   Active Problems:   * No active hospital problems. *    Time spent: 35 minutes.     REGALADO,BELKYS  [facility] Pager [phone]. If 7PM-7AM, please contact night-coverage at [email or web address], password [facility] 02/03/2013, 10:38 AM  LOS: 2 days

## 2013-02-04 DIAGNOSIS — G35 Multiple sclerosis: Secondary | ICD-10-CM | POA: Diagnosis present

## 2013-02-04 DIAGNOSIS — R109 Unspecified abdominal pain: Secondary | ICD-10-CM

## 2013-02-04 LAB — URINE CULTURE

## 2013-02-04 LAB — CBC
HCT: 35.4 % — ABNORMAL LOW (ref 36.0–46.0)
Hemoglobin: 11.3 g/dL — ABNORMAL LOW (ref 12.0–15.0)
MCH: 30 pg (ref 26.0–34.0)
MCHC: 31.9 g/dL (ref 30.0–36.0)

## 2013-02-04 MED ORDER — CIPROFLOXACIN HCL 500 MG PO TABS: 500.0000 mg | ORAL_TABLET | Freq: Two times a day (BID) | ORAL | Status: DC

## 2013-02-04 MED ORDER — GABAPENTIN 100 MG PO CAPS: 100.0000 mg | ORAL_CAPSULE | Freq: Three times a day (TID) | ORAL | Status: DC

## 2013-02-04 MED ORDER — HYDROCODONE-ACETAMINOPHEN 5-325 MG PO TABS: 1.0000 | ORAL_TABLET | Freq: Four times a day (QID) | ORAL | Status: DC | PRN

## 2013-02-04 MED ORDER — GABAPENTIN 100 MG PO CAPS
100.0000 mg | ORAL_CAPSULE | Freq: Three times a day (TID) | ORAL | Status: DC
  Administered 2013-02-04: 100 mg via ORAL
  Filled 2013-02-04 (×2): qty 1

## 2013-02-04 MED ORDER — PREDNISONE (PAK) 10 MG PO TABS: 10.0000 mg | ORAL_TABLET | Freq: Every day | ORAL | Status: DC

## 2013-02-04 NOTE — Progress Notes (Signed)
Ptient d/c home, stable- Hulda Marin RN

## 2013-02-04 NOTE — Discharge Summary (Signed)
Physician Discharge Summary  [name] NWG:[id number] DOB: 12/12/1985 DOA: 02/01/2013  PCP: No primary provider on file.  Admit date: 02/01/2013 Discharge date: 02/04/2013  Time spent: 35 minutes  Recommendations for Outpatient Follow-up:  1. Needs to follow up with family practice medicine at [facility]. Labs that's need to be follow up: vitamin D level, MMA level, urine culture, CSF oligoclonal and IgG  2. Needs to follow up with neurology for further care of MS.   Discharge Diagnoses:  Multiple Sclerosis UTI.  Anemia  Discharge Condition: Stable  Diet recommendation: regular diet.   Filed Weights   02/02/13 0200  Weight: 100.426 kg (221 lb 6.4 oz)    History of present illness:  History of Present Illness:This is a 27 y.o. year old female with no significant prior medical history apart from preeclampsia her last pregnancy in 2009 presenting with recurrent lotion the tingling numbness weakness of the past 2 weeks with new onset diagnosis of multiple sclerosis. Patient drives a bus for work. Patient has noticed recurrent episodes of distal extremity tingling numbness and weakness. Patient is also has a mild numbness in her hands though she's not sure this is related to her driving. Patient states that she noticed some worsening leg pain and weakness whenever she flexes her neck over the past 24 hours and presented to the ER for further evaluation. Patient denies any shortness of breath or recent infections. Patient does report some intermittent chest pains/discomfort that she feels are related to anxiety. No chest pain currently or the past 24-48 hours.  Upon presentation to the ER an MRI of the brain was obtained showing hyperintense lesions had C1, C5, C6 concern for multiple sclerosis. Neurology is consult with recommendation for high dose steroids and inpatient admission.    Hospital Course:  Patient was admitted with numbness and pain of lower extremities. She had MRI brain  and cervical spine with finding consistent of MS. She underwent to LP, she had 23 WBC spinal fluid but negative gram stain and her presentation was not consistent with meningitis. She was afebrile, no confusion.  Patient was started on IV solumedrol. She received  3 days IV steroids. Neurology saw patient today and recommend discharge patient today on prednisone taper and start gabapentin.   1-MS: Continue with IV solumedrol Day 3. CSF with 13 WBC. No fever or clinical evidence of meningitis. CSF oligoclonal pending.  -Vitamin D level pending.  2-Anemia: Likely chronic iron deficiency. anemia panel: B 12 at 309, Folate 9.7, Ferritin at 6, iron 89.  MMA level pending, . No active signs of bleeding.  3-Chest discomfort: episode one month ago. Very atypical. cardiac enzymes times 2 negative.. EKG x1. No chest pain currently. D dimer negative.  4-Dysuria:  UA with negative nitrates and leukocytes. She is now on prednisone. urine culture pending. Will treat empirically for 3 days. .   Procedures: LP 8-21    Consultations:  neurology  Discharge Exam: Filed Vitals:   02/04/13 0659  BP: 124/75  Pulse: 59  Temp: 97.8 F (36.6 C)  Resp: 16    General: No distress.  Cardiovascular: S 1, S 2 RRR Respiratory: CTA  Discharge Instructions  Discharge Orders   Future Orders Complete By Expires   Diet general  As directed    Increase activity slowly  As directed        Medication List         ciprofloxacin 500 MG tablet  Commonly known as:  CIPRO  Take  1 tablet (500 mg total) by mouth 2 (two) times daily.     gabapentin 100 MG capsule  Commonly known as:  NEURONTIN  Take 1 capsule (100 mg total) by mouth 3 (three) times daily.     HYDROcodone-acetaminophen 5-325 MG per tablet  Commonly known as:  NORCO/VICODIN  Take 1 tablet by mouth every 6 (six) hours as needed.     ibuprofen 200 MG tablet  Commonly known as:  ADVIL,MOTRIN  Take by mouth every 6 (six) hours as needed for  pain.     predniSONE 10 MG tablet  Commonly known as:  STERAPRED UNI-PAK  Take 1 tablet (10 mg total) by mouth daily. Take 6 tablet by mouth for one day, then take 5 tablet for one day, then 4 tablet for one day, then 3 tablet for one day then 2 tablet for one day the one tablet then stop.       No Known Allergies     Follow-up Information   Please follow up. (please follow up with PCP in 3 days. )       Follow up with Joycelyn Schmid, MD In 1 week.   Specialties:  Neurology, Radiology   Contact information:   [address] [place] [address] [phone]        The results of significant diagnostics from this hospitalization (including imaging, microbiology, ancillary and laboratory) are listed below for reference.    Significant Diagnostic Studies: Mr Laqueta Jean Wo Contrast  02/28/2013   *RADIOLOGY REPORT*  Clinical Data:  Bilateral lower extremity numbness and tingling  MRI HEAD WITHOUT AND WITH CONTRAST MRI CERVICAL SPINE WITHOUT AND WITH CONTRAST  Technique:  Multiplanar, multiecho pulse sequences of the brain and surrounding structures, and cervical spine, to include the craniocervical junction and cervicothoracic junction, were obtained without and with intravenous contrast.  Contrast: 20mL MULTIHANCE GADOBENATE DIMEGLUMINE 529 MG/ML IV SOLN,  Comparison:   None.  MRI HEAD  Findings:  A few small foci of hyperintensity T / FLAIR signal are seen involving the periventricular white matter.  Specifically, these are seen adjacent to the posterior horn of the right lateral ventricle (series 6, image 14), and adjacent to the lateral aspect of the left lateral ventricle (series 6, image 14).  The right- sided lesion is oriented perpendicular to the ventricles.  No juxta cortical lesions are identified.  No infratentorial lesions are seen.  These are nonspecific, but may be related to possible underlying demyelinating process given the patient's young age. A lesion located in  the left centrum semi ovale demonstrates enhancement on post contrast sequences ( series 3, image 34).  No other enhancing lesions identified.  The CSF containing spaces are normal without evidence of hydrocephalous.  There is no acute intracranial hemorrhage or infarct.  No diffusion weighted signal abnormality.  No extra-axial fluid collection.  The midline structures are intact.  Corpus callosum is normal. Pituitary gland and pituitary stalk are normal.  The craniocervical junction is within normal limits.  IMPRESSION: Periventricular T2 / FLAIR hyperintense lesions.  These findings are most consistent with multiple sclerosis. Subtly enhancing lesion within the left centrum semiovale most likely reflects active demyelination.  MRI CERVICAL SPINE  Findings: The vertebral bodies are normally aligned with preservation of the normal cervical lordosis.  The vertebral body heights are preserved.  Signal intensity from the vertebral body bone marrow is normal.  There is no significant canal or neural foraminal stenosis identified.  Patchy T2 hyperintense signal intensity is  seen within the dorsal aspect of the cord at the level of the the craniocervical junction and C1, as well as distally at C5-6.  A 7 mm mm enhancing lesion within the dorsal aspect of the spinal cord at the level of C1 (series 8, image 10).  A second 4 mm enhancing lesion seen within the dorsal aspect of the cord. These findings are consistent with underlying demyelinating process such as multiple sclerosis.  IMPRESSION:  Patchy T2 hyperintense signal intensity at the craniocervical junction and C5-6, worrisome for multiple sclerosis.  Enhancing lesions at C1 and C6 likely reflect active demyelination.   Original Report Authenticated By: Rise Mu, M.D.   Mr Cervical Spine W Wo Contrast  02/01/2013   *RADIOLOGY REPORT*  Clinical Data:  Bilateral lower extremity numbness and tingling  MRI HEAD WITHOUT AND WITH CONTRAST MRI CERVICAL SPINE  WITHOUT AND WITH CONTRAST  Technique:  Multiplanar, multiecho pulse sequences of the brain and surrounding structures, and cervical spine, to include the craniocervical junction and cervicothoracic junction, were obtained without and with intravenous contrast.  Contrast: 20mL MULTIHANCE GADOBENATE DIMEGLUMINE 529 MG/ML IV SOLN,  Comparison:   None.  MRI HEAD  Findings:  A few small foci of hyperintensity T / FLAIR signal are seen involving the periventricular white matter.  Specifically, these are seen adjacent to the posterior horn of the right lateral ventricle (series 6, image 14), and adjacent to the lateral aspect of the left lateral ventricle (series 6, image 14).  The right- sided lesion is oriented perpendicular to the ventricles.  No juxta cortical lesions are identified.  No infratentorial lesions are seen.  These are nonspecific, but may be related to possible underlying demyelinating process given the patient's young age. A lesion located in the left centrum semi ovale demonstrates enhancement on post contrast sequences ( series 3, image 34).  No other enhancing lesions identified.  The CSF containing spaces are normal without evidence of hydrocephalous.  There is no acute intracranial hemorrhage or infarct.  No diffusion weighted signal abnormality.  No extra-axial fluid collection.  The midline structures are intact.  Corpus callosum is normal. Pituitary gland and pituitary stalk are normal.  The craniocervical junction is within normal limits.  IMPRESSION: Periventricular T2 / FLAIR hyperintense lesions.  These findings are most consistent with multiple sclerosis. Subtly enhancing lesion within the left centrum semiovale most likely reflects active demyelination.  MRI CERVICAL SPINE  Findings: The vertebral bodies are normally aligned with preservation of the normal cervical lordosis.  The vertebral body heights are preserved.  Signal intensity from the vertebral body bone marrow is normal.  There is  no significant canal or neural foraminal stenosis identified.  Patchy T2 hyperintense signal intensity is seen within the dorsal aspect of the cord at the level of the the craniocervical junction and C1, as well as distally at C5-6.  A 7 mm mm enhancing lesion within the dorsal aspect of the spinal cord at the level of C1 (series 8, image 10).  A second 4 mm enhancing lesion seen within the dorsal aspect of the cord. These findings are consistent with underlying demyelinating process such as multiple sclerosis.  IMPRESSION:  Patchy T2 hyperintense signal intensity at the craniocervical junction and C5-6, worrisome for multiple sclerosis.  Enhancing lesions at C1 and C6 likely reflect active demyelination.   Original Report Authenticated By: Rise Mu, M.D.   Dg Fluoro Guide Lumbar Puncture  02/02/2013   CLINICAL DATA:  Evaluate for multiple sclerosis.  EXAM: DIAGNOSTIC  LUMBAR PUNCTURE UNDER FLUOROSCOPIC GUIDANCE  FLUOROSCOPY TIME:  0 min, 18 seconds.  PROCEDURE: Informed consent was obtained from the patient prior to the procedure, including potential complications of headache, allergy, and pain. With the patient prone, the lower back was prepped with Betadine. 1% Lidocaine was used for local anesthesia. Lumbar puncture was performed at the L2-3 level using a gauge needle with return of clearCSF with an opening pressure of cm water. 11ml of CSF were obtained for laboratory studies. The patient tolerated the procedure well and there were no apparent complications.  IMPRESSION: Technically successful lumbar puncture with fluoroscopic guidance.   Electronically Signed   By: Charlett Nose   On: 02/02/2013 10:08    Microbiology: Recent Results (from the past 240 hour(s))  GRAM STAIN     Status: None   Collection Time    02/02/13  9:40 AM      Result Value Range Status   Specimen Description CSF   Final   Special Requests NONE   Final   Gram Stain     Final   Value: WBC SEEN     NO ORGANISMS SEEN      CYTOSPUN SMEAR     Gram Stain Report Called to,Read Back By and Verified With: Raelene Bott [phone] @ 1119 BY J SCOTTON   Report Status 02/02/2013 FINAL   Final     Labs: Basic Metabolic Panel:  Recent Labs Lab 02/01/13 2015 02/02/13 0705  NA 138 135  K 3.8 3.9  CL 102 99  CO2 27 25  GLUCOSE 85 132*  BUN 9 9  CREATININE 0.90 0.84  CALCIUM 9.4 9.6  MG 1.8  --    Liver Function Tests:  Recent Labs Lab 02/01/13 2015 02/02/13 0705  AST 18 16  ALT 21 19  ALKPHOS 42 46  BILITOT 0.7 0.7  PROT 7.3 7.5  ALBUMIN 3.9 3.9   No results found for this basename: LIPASE, AMYLASE,  in the last 168 hours No results found for this basename: AMMONIA,  in the last 168 hours CBC:  Recent Labs Lab 02/01/13 2015 02/02/13 0705 02/04/13 0352  WBC 7.3 6.8 17.2*  NEUTROABS 3.7  --   --   HGB 11.2* 12.3 11.3*  HCT 35.2* 37.9 35.4*  MCV 93.1 91.8 93.9  PLT 384 404* 427*   Cardiac Enzymes:  Recent Labs Lab 02/02/13 0135 02/02/13 0705 02/02/13 1245  TROPONINI <0.30 <0.30 <0.30   BNP: BNP (last 3 results) No results found for this basename: PROBNP,  in the last 8760 hours CBG: No results found for this basename: GLUCAP,  in the last 168 hours     Signed:  Nihal Doan  [facility] 02/04/2013, 12:15 PM

## 2013-02-04 NOTE — Progress Notes (Signed)
Patients d/c instruction and prescription given to patient, verbalized understanding.[facility] delivered her walker. Patient is stable,pain is controlled. Aleert and orientedx4.- Hulda Marin RN

## 2013-02-04 NOTE — Progress Notes (Signed)
Subjective: She complained of paresthesias involving upper extremities and burning sensation in the legs and feet.  Objective: Current vital signs: BP 124/75  Pulse 59  Temp(Src) 97.8 F (36.6 C) (Oral)  Resp 16  Ht 5\' 4"  (1.626 m)  Wt 100.426 kg (221 lb 6.4 oz)  BMI 37.98 kg/m2  SpO2 99%  LMP 12/22/2012  Neurologic Exam: Alert and in no acute distress. Mental status was normal. Strength of upper and lower extremities was normal proximally and distally. Coordination was normal. Deep tendon reflexes were 2+ and brisk and symmetrical. Coordination was normal. Perception of tactile stimulation and vibration was normal in both lower extremities.  Medications: I have reviewed the patient's current medications.  Assessment/Plan: Probable multiple sclerosis with acute exacerbation involving cervical cord with acute demyelinating myelopathy. She has improved symptomatically on high-dose steroids with reduction in numbness of her extremities, particularly lower extremities. Unfortunately she is able to better perceive paresthesias as well as pain which is exacerbated by pressure, particularly in the legs and feet.  Recommendations: 1. Trial of Neurontin for management of paresthesias and pain, 100 mg 3 times a day. 2. Followup outpatient neurology appointment is planned. 3. Physical therapy followup by home health service following discharge. 4. Steroid taper starting at 60 mg per day and tapering by 10 mg per day 5. No objection to discharge to home today.  C.R. Roseanne Reno, MD [facility] [phone]  02/04/2013  2:59 PM

## 2013-02-06 ENCOUNTER — Ambulatory Visit (INDEPENDENT_AMBULATORY_CARE_PROVIDER_SITE_OTHER): Payer: 59 | Admitting: Family Medicine

## 2013-02-06 ENCOUNTER — Encounter: Payer: Self-pay | Admitting: Family Medicine

## 2013-02-06 VITALS — BP 156/97 | HR 51 | Temp 98.8°F | Ht 64.0 in | Wt 223.4 lb

## 2013-02-06 DIAGNOSIS — G35 Multiple sclerosis: Secondary | ICD-10-CM

## 2013-02-06 MED ORDER — GABAPENTIN 100 MG PO CAPS: 300.0000 mg | ORAL_CAPSULE | Freq: Three times a day (TID) | ORAL | Status: DC

## 2013-02-06 NOTE — Assessment & Plan Note (Addendum)
Patient with recent diagnosis of multiple sclerosis.  Symptoms, imaging, oligoclonal bands all consistent with multiple sclerosis. Patient was scheduled to followup with neurology in September, however I have expedited this and got her an appointment on Wednesday 8/27 at 11 AM.   Patient is to be followed by neurology for discussions about treatment and prognosis.   I have increased her gabapentin today today in her numbness/tingling.  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 I will let  Neurology clear her regarding ability to return to work

## 2013-02-06 NOTE — Patient Instructions (Addendum)
It was nice seeing you today.  Please follow up with Neurology on Wed. 02/08/13 @ 11:00 am.    I have increased your dose of Gabapentin.  Please take it as prescribed.  Please follow up with your PCP as needed (in ~ 3 months.)

## 2013-02-06 NOTE — Progress Notes (Signed)
Subjective:     Patient ID: [name], female   DOB: 1986-03-15, 27 y.o.   MRN: [id number]  HPI [name] is a 27 year old female presenting for hospital follow up and to re-establish care at [facility].  Patient recently hospitalized on 8/20.  She presented to the hospital with extremity numbness and tingling of 2 weeks duration.  MRI was obtained and revealed lesions consistent with multiple sclerosis.  Lumbar puncture was done and oligoclonal bands were obtained.  Greater than 5 bands was noted and given MRI findings patient diagnosed with multiple sclerosis.  Patient was treated with steroids and was discharged on 8/23.   Today [name] reports that she is feeling well.  She does, however, continued to have lower extremity numbness and tingling.  Numbness and tingling is located from the elbows down and from the knees down.  She reports occasional associated weakness (for example, inability to handle on objects).  The numbness and tingling is associated with pain as well.  No recent fevers, chills, shortness of breath, chest pain, nausea/ vomiting/diarrhea, or headache.  She does endorse some blurred vision.   Review of Systems Per HPI     Objective:   Physical Exam Filed Vitals:   02/06/13 1020  BP: 156/97  Pulse: 51  Temp: 98.8 F (37.1 C)   Exam: General: well appearing female in NAD. HEENT: NCAT. Pupils equal and reactive to light and accommodation.  Extraocular muscles intact.  Cardiovascular: RRR. No murmurs, rubs, or gallops. Respiratory: CTAB. No rales, rhonchi, or wheeze. Abdomen: soft, nontender, nondistended. Extremities: warm, well perfused. No LE edema. Neuro: Cranial nerves 2-12 intact.  2+ reflexes (patellar, achilles, biceps, triceps, brachioradialis). 5/5 muscle strength in all extremities.      Assessment:     See Problem List     Plan:

## 2013-02-08 ENCOUNTER — Ambulatory Visit (INDEPENDENT_AMBULATORY_CARE_PROVIDER_SITE_OTHER): Payer: 59 | Admitting: Neurology

## 2013-02-08 ENCOUNTER — Encounter: Payer: Self-pay | Admitting: Neurology

## 2013-02-08 VITALS — BP 142/94 | HR 60 | Ht 66.5 in | Wt 228.0 lb

## 2013-02-08 DIAGNOSIS — G35 Multiple sclerosis: Secondary | ICD-10-CM

## 2013-02-08 LAB — VITAMIN D 1,25 DIHYDROXY: Vitamin D2 1, 25 (OH)2: <8 pg/mL

## 2013-02-08 NOTE — Progress Notes (Signed)
Reason for visit: Multiple sclerosis  [name] is a 27 y.o. female  History of present illness:  [name] is a 27 year old right-handed black female with a history of onset of sensory symptoms 2 weeks prior to this evaluation. The patient was admitted to the hospital around 02/01/2013. The patient spent about 3 days in the hospital, and the evaluation revealed evidence of several white matter changes in the brain, with 1 enhancing lesion. The patient also had at least 2 levels of the spinal cord lesions, one at the cervical medullary junction, one at the C5 level. The patient was given a three-day course of IV Solu-Medrol, and she has been converted to a tapering dose of prednisone. Within a day or 2 after coming out of the hospital, the patient developed some tinnitus in the left ear, and some visual blurring and pain on the left eye. The patient also reports ongoing issues with numbness of the legs bilaterally, and she gets tingling sensations down the legs with neck flexion. The patient reports that when she urinates, she also gets unusual sensory sensations down both legs. The patient denies any weakness of the extremities, and she denies any significant balance issues. The patient is able to control the bowels and bladder well. The patient has some fatigue issues. The patient has undergone lumbar puncture revealing 5 oligoclonal bands. The patient is sent to this office for followup with the multiple sclerosis.  Past Medical History  Diagnosis Date  . Preterm labor   . Gonorrhea   . Abnormal Pap smear     Colpo>normal  . Urinary tract infection   . Ovarian cyst   . Neuromuscular disorder     diagnosed with MS this visit  . Multiple sclerosis   . Obesity     Past Surgical History  Procedure Laterality Date  . Tubal ligation      2012  . Colposcopy      Family History  Problem Relation Age of Onset  . Anesthesia problems Neg Hx   . Other Neg Hx   . Diabetes Mother    . Hypertension Mother   . Diabetes Father   . Hypertension Father     Social history:  reports that she has never smoked. She has never used smokeless tobacco. She reports that  drinks alcohol. She reports that she does not use illicit drugs.  Medications:  Current Outpatient Prescriptions on File Prior to Visit  Medication Sig Dispense Refill  . ciprofloxacin (CIPRO) 500 MG tablet Take 1 tablet (500 mg total) by mouth 2 (two) times daily.  6 tablet  0  . gabapentin (NEURONTIN) 100 MG capsule Take 3 capsules (300 mg total) by mouth 3 (three) times daily.  90 capsule  1  . HYDROcodone-acetaminophen (NORCO/VICODIN) 5-325 MG per tablet Take 1 tablet by mouth every 6 (six) hours as needed.  30 tablet  0  . ibuprofen (ADVIL,MOTRIN) 200 MG tablet Take by mouth every 6 (six) hours as needed for pain.      . predniSONE (STERAPRED UNI-PAK) 10 MG tablet Take 1 tablet (10 mg total) by mouth daily. Take 6 tablet by mouth for one day, then take 5 tablet for one day, then 4 tablet for one day, then 3 tablet for one day then 2 tablet for one day the one tablet then stop.  21 tablet  0   No current facility-administered medications on file prior to visit.     No Known Allergies  ROS:  Out of a complete 14 system review of symptoms, the patient complains only of the following symptoms, and all other reviewed systems are negative.  Fevers, chills Fatigue Ringing in the ears Blurred vision, eye pain Constipation  Urination problems Increased thirst Joint pain, muscle cramps, achy muscles Headache, numbness, weakness, dizziness Racing thoughts Sleepiness, restless legs  Blood pressure 142/94, pulse 60, height 5' 6.5" (1.689 m), weight 228 lb (103.42 kg), last menstrual period 12/22/2012.  Physical Exam  General: The patient is alert and cooperative at the time of the examination. The patient is markedly obese.  Head: Pupils are equal, round, and reactive to light. Discs are flat  bilaterally.  Neck: The neck is supple, no carotid bruits are noted.  Respiratory: The respiratory examination is clear.  Cardiovascular: The cardiovascular examination reveals a regular rate and rhythm, no obvious murmurs or rubs are noted.  Skin: Extremities are without significant edema.  Neurologic Exam  Mental status:  Cranial nerves: Facial symmetry is present. There is good sensation of the face to pinprick and soft touch bilaterally. The strength of the facial muscles and the muscles to head turning and shoulder shrug are normal bilaterally. Speech is well enunciated, no aphasia or dysarthria is noted. Extraocular movements are full. Visual fields are full.  Motor: The motor testing reveals 5 over 5 strength of all 4 extremities. Good symmetric motor tone is noted throughout.  Sensory: Sensory testing is intact to pinprick, soft touch, vibration sensation, and position sense on all 4 extremities. No evidence of extinction is noted.  Coordination: Cerebellar testing reveals good finger-nose-finger and heel-to-shin bilaterally.  Gait and station: Gait is normal. Tandem gait is normal. Romberg is negative. No drift is seen.  Reflexes: Deep tendon reflexes are symmetric and normal bilaterally. Toes are downgoing bilaterally.   Assessment/Plan:  1. Multiple sclerosis  2. Obesity  The patient appears to have new symptoms since coming out of the hospital. The patient will be set up for a visual evoked response test to exclude a possible left optic neuritis. The patient has already been treated with Solu-Medrol and she is on a tapering dose of prednisone. The patient will undergo further blood work, and she will was given information regarding various treatments for multiple sclerosis. She will hopefully make a decision within the next week about her therapy, and we will initiate treatment. The patient will followup in about 4 months. The patient was given a note to return to work by  02/19/2013. Clinical examination is normal, all symptoms are subjective. The patient is already asking about disability.  Meghan Palau MD 02/08/2013 12:34 PM  [facility] [address] [place], [place] [address]  Phone [phone] Fax [phone]

## 2013-02-09 ENCOUNTER — Other Ambulatory Visit (INDEPENDENT_AMBULATORY_CARE_PROVIDER_SITE_OTHER): Payer: 59

## 2013-02-09 ENCOUNTER — Other Ambulatory Visit: Payer: Self-pay | Admitting: *Deleted

## 2013-02-09 DIAGNOSIS — Z0289 Encounter for other administrative examinations: Secondary | ICD-10-CM

## 2013-02-10 ENCOUNTER — Ambulatory Visit (INDEPENDENT_AMBULATORY_CARE_PROVIDER_SITE_OTHER): Payer: 59 | Admitting: Neurology

## 2013-02-10 ENCOUNTER — Telehealth: Payer: Self-pay | Admitting: Neurology

## 2013-02-10 VITALS — BP 122/72 | HR 86 | Temp 97.8°F

## 2013-02-10 DIAGNOSIS — G35 Multiple sclerosis: Secondary | ICD-10-CM

## 2013-02-10 MED ORDER — SODIUM CHLORIDE 0.9 % IV SOLN
500.0000 mg | INTRAVENOUS | Status: DC
  Administered 2013-02-10: 500 mg via INTRAVENOUS

## 2013-02-10 NOTE — Patient Instructions (Signed)
Patient will hear from [facility] to set appointment times for IV therapy.

## 2013-02-10 NOTE — Telephone Encounter (Signed)
I called patient. The patient is having an exacerbation of her MS. The patient has had incontinence of bladder since yesterday, and the left side the body feels heavy, and she is having shock sensations down the arms and legs with neck flexion. The patient will need another course of Solu-Medrol, we will defer a five-day course of 500 mg IV daily. We will have her come in today to get the first dose. We will have home health nursing to handle the rest.

## 2013-02-10 NOTE — Progress Notes (Signed)
Patient here for Solumedrol infusion.  Patient to treatment room.  IV started in left hand 24g angiocath, good blood return.  SoluMedrol 500mg /159ml NS started at 1430.  Patient has recently been diagnosed with MS and decided on Copaxone as her choice of treatment.  Patient signed enrollment form and given information about medication.  She will be continuing Solumedrol infusions with home health starting tomorrow.  IV was flushed and saline locked, wrapped in gauge and curlex.  Patient to checkout in NAD.

## 2013-02-10 NOTE — Telephone Encounter (Signed)
Patient came for Solumedrol infusion today and is set up with [facility] for completion of 5 day course.

## 2013-02-11 LAB — ANGIOTENSIN CONVERTING ENZYME: Angio Convert Enzyme: 18 U/L (ref 14–82)

## 2013-02-12 ENCOUNTER — Telehealth: Payer: Self-pay | Admitting: Neurology

## 2013-02-12 MED ORDER — DEXAMETHASONE 1 MG PO TABS: ORAL_TABLET | ORAL | Status: DC

## 2013-02-12 NOTE — Telephone Encounter (Signed)
HH nursing called. Unable to get IV access for the solumedrol. I will call in a decadron taper.

## 2013-02-14 ENCOUNTER — Telehealth: Payer: Self-pay

## 2013-02-14 LAB — METHYLMALONIC ACID(MMA), RND URINE: Creatinine, Urine mg/dL-MMAURN: 28.65 mmol/L — ABNORMAL HIGH (ref 2.38–26.55)

## 2013-02-14 NOTE — Telephone Encounter (Signed)
Message copied by [facility] on Tue Feb 14, 2013  9:43 AM ------      Message from: Stephanie Acre      Created: Sat Feb 11, 2013  7:23 PM       Please call the patient. The blood work results are unremarkable. Thank you.            ----- Message -----         From: [facility] Lab Results In Interface         Sent: 02/11/2013   6:35 AM           To: York Spaniel, MD                   ------

## 2013-02-14 NOTE — Telephone Encounter (Signed)
I called patient and let her know that her blood work results came back and were reviewed by Dr. Anne Hahn. He found that the blood work was unremarkable and there is nothing of concern to him. Patient thanked me.

## 2013-02-16 ENCOUNTER — Ambulatory Visit (INDEPENDENT_AMBULATORY_CARE_PROVIDER_SITE_OTHER): Payer: 59

## 2013-02-16 DIAGNOSIS — G35 Multiple sclerosis: Secondary | ICD-10-CM

## 2013-02-16 NOTE — Procedures (Signed)
History:   [name] is a 27 year old patient with a history of multiple sclerosis that was diagnosed recently. The patient reports new blurring of vision of the left eye, and she is being evaluated for a possible optic neuritis.  Description: The visual evoked response test was performed today using 32 x 32 check sizes. The absolute latencies for the N1 and the P100 wave forms were within normal limits bilaterally. The amplitudes for the P100 wave forms were also within normal limits bilaterally. The visual acuity was 20/30 OD and 20/50 OS uncorrected.  Impression:  The visual evoked response test above was within normal limits bilaterally. No evidence of conduction slowing was seen within the anterior visual pathways on either side on today's evaluation.

## 2013-02-20 NOTE — Progress Notes (Signed)
Quick Note:  I called pt and relayed the VER results (normal). She is taking the oral steroids (taper). Has noted that sx she had subsided and the last 2 days has noted when bends her neck down she receives electrical shocks down to her legs. I told her to keep taking steroids until finished. Copaxone nurse to come out when she receives her medication in the mail. ______

## 2013-02-22 ENCOUNTER — Emergency Department (HOSPITAL_COMMUNITY)
Admission: EM | Admit: 2013-02-22 | Discharge: 2013-02-22 | Disposition: A | Discharge status: Home / Self Care | Payer: 59 | Attending: Emergency Medicine | Admitting: Emergency Medicine

## 2013-02-22 ENCOUNTER — Telehealth: Payer: Self-pay | Admitting: Neurology

## 2013-02-22 ENCOUNTER — Encounter (HOSPITAL_COMMUNITY): Payer: Self-pay | Admitting: *Deleted

## 2013-02-22 DIAGNOSIS — E669 Obesity, unspecified: Secondary | ICD-10-CM | POA: Diagnosis not present

## 2013-02-22 DIAGNOSIS — Z8619 Personal history of other infectious and parasitic diseases: Secondary | ICD-10-CM | POA: Insufficient documentation

## 2013-02-22 DIAGNOSIS — Z79899 Other long term (current) drug therapy: Secondary | ICD-10-CM | POA: Insufficient documentation

## 2013-02-22 DIAGNOSIS — Z8751 Personal history of pre-term labor: Secondary | ICD-10-CM | POA: Insufficient documentation

## 2013-02-22 DIAGNOSIS — R209 Unspecified disturbances of skin sensation: Secondary | ICD-10-CM | POA: Insufficient documentation

## 2013-02-22 DIAGNOSIS — G35 Multiple sclerosis: Secondary | ICD-10-CM

## 2013-02-22 DIAGNOSIS — M79609 Pain in unspecified limb: Secondary | ICD-10-CM | POA: Diagnosis present

## 2013-02-22 DIAGNOSIS — Z87440 Personal history of urinary (tract) infections: Secondary | ICD-10-CM | POA: Insufficient documentation

## 2013-02-22 MED ORDER — SODIUM CHLORIDE 0.9 % IV SOLN
500.0000 mg | Freq: Once | INTRAVENOUS | Status: AC
  Administered 2013-02-22: 500 mg via INTRAVENOUS
  Filled 2013-02-22: qty 4

## 2013-02-22 MED ORDER — PREDNISONE 10 MG PO TABS: ORAL_TABLET | ORAL | Status: DC

## 2013-02-22 MED ORDER — GABAPENTIN 400 MG PO CAPS: 400.0000 mg | ORAL_CAPSULE | Freq: Three times a day (TID) | ORAL | Status: DC

## 2013-02-22 MED ORDER — MORPHINE SULFATE 4 MG/ML IJ SOLN
4.0000 mg | Freq: Once | INTRAMUSCULAR | Status: AC
  Administered 2013-02-22: 4 mg via INTRAVENOUS
  Filled 2013-02-22: qty 1

## 2013-02-22 NOTE — ED Notes (Signed)
Pt was upset at discharge.  This RN educated the Pt that after an assessment the PA and MD feel that her pain is being caused by her MS diagnoses.  She was also educated that it is a chronic condition and she needs to follow up with her Neurologist.

## 2013-02-22 NOTE — ED Notes (Signed)
Pt c/o electric shock feeling from waist down legs; states legs dont want to work today; feet feel numb; recently diagnosed with MS on 8/20--presented with same symptoms at that time; states worse now; on last day of a steroid taper from neurologist; last night had electric shock feeling thru tongue and face--tongue still feels numb; Dr. Anne Hahn at [facility]; pt spoke to nurse at office on Monday

## 2013-02-22 NOTE — ED Provider Notes (Signed)
Medical screening examination/treatment/procedure(s) were performed by non-physician practitioner and as supervising physician I was immediately available for consultation/collaboration.   Aylan Bayona, MD 02/22/13 1513 

## 2013-02-22 NOTE — ED Provider Notes (Signed)
CSN: [id number]     Arrival date & time 02/22/13  4098 History   First MD Initiated Contact with Patient 02/22/13 [phone]     Chief Complaint  Patient presents with  . Leg Pain  . Numbness   (Consider location/radiation/quality/duration/timing/severity/associated sxs/prior Treatment) HPI  27 y/o female who was recently diagnosed with multiple sclerosis on August 28th presenting complaining of sharp shooting pain.  Patient states she initially developed sharp shooting pain from her knees down to both legs and her legs gave out, which was when she initially seen and diagnosed with MS. She was started on prednisone burst and taper dose and is down to her last taper dose. She noticed that the steroid initially has helped with her symptoms but as she is tapering down on the medication she developed worsening symptoms. Symptoms include sharp shooting pain from her chest down to her leg worsening with certain movement or with palpation. Report having pain to both knees, described as "bone grinding on bone". Patient is now having chief use a walker to move around. She also report having electric like shocks going through the tongue and mouth, and currently complaining of numbing tongue. Reports having shortness of breath with ambulation because she feels so weak. No complaints of fever, chills, headache, vision changes, trouble swallowing, chest pain, back pain, urinary or bowel incontinence, saddle paresthesia, or rash. Patient has tried calling her neurologist office 2 days ago for her complaint but has not received a call back.  Past Medical History  Diagnosis Date  . Preterm labor   . Gonorrhea   . Abnormal Pap smear     Colpo>normal  . Urinary tract infection   . Ovarian cyst   . Neuromuscular disorder     diagnosed with MS this visit  . Multiple sclerosis   . Obesity    Past Surgical History  Procedure Laterality Date  . Tubal ligation      2012  . Colposcopy     Family History  Problem  Relation Age of Onset  . Anesthesia problems Neg Hx   . Other Neg Hx   . Diabetes Mother   . Hypertension Mother   . Diabetes Father   . Hypertension Father    History  Substance Use Topics  . Smoking status: Never Smoker   . Smokeless tobacco: Never Used  . Alcohol Use: Yes     Comment: occasional   OB History   Grav Para Term Preterm Abortions TAB SAB Ect Mult Living   6 3 1 2 3 3   1 4      Review of Systems  All other systems reviewed and are negative.    Allergies  Review of patient's allergies indicates no known allergies.  Home Medications   Current Outpatient Rx  Name  Route  Sig  Dispense  Refill  . dexamethasone (DECADRON) 1 MG tablet      Began taking 6 tablets daily, taper by one tablet every other day until off the medication.   42 tablet   0   . gabapentin (NEURONTIN) 100 MG capsule   Oral   Take 3 capsules (300 mg total) by mouth 3 (three) times daily.   90 capsule   1   . HYDROcodone-acetaminophen (NORCO/VICODIN) 5-325 MG per tablet   Oral   Take 1 tablet by mouth every 6 (six) hours as needed.   30 tablet   0   . ibuprofen (ADVIL,MOTRIN) 200 MG tablet   Oral   Take  by mouth every 6 (six) hours as needed for pain.          BP 150/93  Pulse 77  Temp(Src) 99.3 F (37.4 C) (Oral)  Resp 20  Ht 5\' 4"  (1.626 m)  SpO2 97%  LMP 02/22/2013 Physical Exam  Nursing note and vitals reviewed. Constitutional: She appears well-developed and well-nourished. No distress.  HENT:  Head: Atraumatic.  Mouth/Throat: Oropharynx is clear and moist.  Normal tongue movement  Eyes: Conjunctivae are normal. Pupils are equal, round, and reactive to light.  Neck: Normal range of motion. Neck supple.  Cardiovascular: Normal rate and regular rhythm.   Pulmonary/Chest: Effort normal and breath sounds normal.  Abdominal: Soft. There is no tenderness.  Neurological:  Speech clear, pupils equal round reactive to light, extraocular movements intact   Normal  peripheral visual fields Cranial nerves III through XII normal including no facial droop Follows commands, moves all extremities x4, normal strength to bilateral upper and lower extremities at all major muscle groups including grip Sensation normal to light touch  Patella DTR mildly hyperreflexic bilat.  No foot drop Coordination intact, no limb ataxia, finger-nose-finger normal Rapid alternating movements decreased No pronator drift Gait slow but steady   Skin: No rash noted.  Psychiatric: She has a normal mood and affect.    ED Course  Procedures (including critical care time)  9:07 AM Pt recently diagnosed with MS, here with worsening neuromuscular manifestation.  No obvious focal neuro deficits on exam.   9:32 AM I have consulted with neurologist, Dr. Anne Hahn who recommend starting pt with 500mg  Solumedrol via IV.  He will call in for prescription of steroid and neurontin and will have pt f/u outpt for further care.  Pt does not need admission at this time.    Labs Review Labs Reviewed - No data to display Imaging Review No results found.  MDM   1. Multiple sclerosis exacerbation    BP 150/93  Pulse 77  Temp(Src) 99.3 F (37.4 C) (Oral)  Resp 20  Ht 5\' 4"  (1.626 m)  SpO2 97%  LMP 02/22/2013     Fayrene Helper, PA-C 02/22/13 1048

## 2013-02-22 NOTE — Telephone Encounter (Signed)
Dr. Anne Hahn spoke to ED re: pt med management.

## 2013-02-22 NOTE — ED Notes (Signed)
PA at bedside.

## 2013-02-22 NOTE — ED Notes (Signed)
Pt escorted to discharge window. Verbalized understanding discharge instructions. In no acute distress.   

## 2013-02-22 NOTE — Telephone Encounter (Signed)
The emergency room called, the patient has gone in with increased pain in the legs. The pain is increased with the taper of steroids. The patient will be given another 500 mg Solu-Medrol dose. The gabapentin may need to be increased. I will call in another course of steroids. The patient will be going on Copaxone soon.

## 2013-02-23 ENCOUNTER — Telehealth: Payer: Self-pay | Admitting: Neurology

## 2013-02-24 ENCOUNTER — Telehealth: Payer: Self-pay

## 2013-02-24 ENCOUNTER — Telehealth: Payer: Self-pay | Admitting: Neurology

## 2013-02-24 ENCOUNTER — Ambulatory Visit: Payer: 59 | Admitting: Diagnostic Neuroimaging

## 2013-02-24 NOTE — Telephone Encounter (Signed)
I called patient. The patient has had ongoing problems with walking, sensory changes. We will need to keep her out of work for at least 3 more weeks. The patient indicates that Copaxone was denied through her insurance, but it appears that the request was just done in. I'll try to find out what is going on.

## 2013-02-24 NOTE — Telephone Encounter (Signed)
I have not gotten a denial for this patient.  I called [employer] at [phone]. Spoke with [employer].  She said we will have to appeal the denial and they will fax [place] the denial letter explaining the reasoning and what steps need to be taken.  Said she could not disclose info over the phone, and we would need to refer to denial letter.  Pending fax.

## 2013-02-24 NOTE — Telephone Encounter (Signed)
Patient requesting a letter stating condition/diagnosis for work.  Letter does not need to include any restrictions per patient. Patient just wants a letter on file with job. Patient works in transportation as a Hospital doctor. She says she woke up this a.m and her legs would not move. Patient says meds do not alleviate pain and numbness. Capaxone was not approved by her insurance and she would like to know of alternatives also.

## 2013-02-24 NOTE — Telephone Encounter (Signed)
[name] with [employer] called asking that we contact [employer] for Prior Auth on Copaxone at [phone].  I have completed all required paperwork and faxed it to the plan.  Pending response.

## 2013-02-27 ENCOUNTER — Telehealth: Payer: Self-pay | Admitting: Neurology

## 2013-02-27 NOTE — Telephone Encounter (Signed)
[name] I see other notes about Copaxone please see current note.

## 2013-02-28 NOTE — Telephone Encounter (Signed)
Copaxone 40 mg three times weekly dose is not covered under the patient's plan.  It is currently considered a drug "excluded" and no authorization can be obtained.  The patient's insurance has not updated their formulary, and at this time it appears only the 20 mg daily injection is covered.  We will have to change the patient to the 20 mg injection.  (I have previously spoken to Dr Anne Hahn, he agreed that in the event TIW dose was not covered, we would need to change patient from 40 mg to 20 mg)    I called the patient to advise.  Got no answer, left message.    I also called [employer] at [phone].  Spoke with Ramona.  She asked that we resent the enrollment form noting 20 mg rather than 40 mg.  I have re-faxed the application to [employer] stating 40 mg is not covered and we need to dispense 20 mg until the patient's insurance formulary is updated.  They are hoping this will take place before the end of the year.

## 2013-03-11 ENCOUNTER — Emergency Department (HOSPITAL_COMMUNITY): Payer: Medicaid Other

## 2013-03-11 ENCOUNTER — Emergency Department (HOSPITAL_COMMUNITY)
Admission: EM | Admit: 2013-03-11 | Discharge: 2013-03-11 | Disposition: A | Discharge status: Home / Self Care | Payer: Medicaid Other | Attending: Emergency Medicine | Admitting: Emergency Medicine

## 2013-03-11 ENCOUNTER — Encounter (HOSPITAL_COMMUNITY): Payer: Self-pay | Admitting: Emergency Medicine

## 2013-03-11 DIAGNOSIS — Z87440 Personal history of urinary (tract) infections: Secondary | ICD-10-CM | POA: Insufficient documentation

## 2013-03-11 DIAGNOSIS — E669 Obesity, unspecified: Secondary | ICD-10-CM | POA: Insufficient documentation

## 2013-03-11 DIAGNOSIS — G35 Multiple sclerosis: Secondary | ICD-10-CM | POA: Insufficient documentation

## 2013-03-11 DIAGNOSIS — Z3202 Encounter for pregnancy test, result negative: Secondary | ICD-10-CM | POA: Insufficient documentation

## 2013-03-11 DIAGNOSIS — Z8619 Personal history of other infectious and parasitic diseases: Secondary | ICD-10-CM | POA: Insufficient documentation

## 2013-03-11 DIAGNOSIS — Z8751 Personal history of pre-term labor: Secondary | ICD-10-CM | POA: Insufficient documentation

## 2013-03-11 DIAGNOSIS — Z79899 Other long term (current) drug therapy: Secondary | ICD-10-CM | POA: Insufficient documentation

## 2013-03-11 DIAGNOSIS — R531 Weakness: Secondary | ICD-10-CM

## 2013-03-11 DIAGNOSIS — R51 Headache: Secondary | ICD-10-CM | POA: Insufficient documentation

## 2013-03-11 DIAGNOSIS — R5381 Other malaise: Secondary | ICD-10-CM | POA: Insufficient documentation

## 2013-03-11 LAB — POCT I-STAT, CHEM 8
BUN: 9 mg/dL (ref 6–23)
Calcium, Ion: 1.2 mmol/L (ref 1.12–1.23)
Creatinine, Ser: 1 mg/dL (ref 0.50–1.10)
Glucose, Bld: 88 mg/dL (ref 70–99)
HCT: 40 % (ref 36.0–46.0)
Hemoglobin: 13.6 g/dL (ref 12.0–15.0)
Potassium: 3.9 mEq/L (ref 3.5–5.1)
TCO2: 25 mmol/L (ref 0–100)

## 2013-03-11 MED ORDER — GADOBENATE DIMEGLUMINE 529 MG/ML IV SOLN
20.0000 mL | Freq: Once | INTRAVENOUS | Status: AC | PRN
  Administered 2013-03-11: 20 mL via INTRAVENOUS

## 2013-03-11 MED ORDER — MORPHINE SULFATE 4 MG/ML IJ SOLN
4.0000 mg | Freq: Once | INTRAMUSCULAR | Status: AC
  Administered 2013-03-11: 4 mg via INTRAVENOUS
  Filled 2013-03-11: qty 1

## 2013-03-11 MED ORDER — HYDROCODONE-ACETAMINOPHEN 5-325 MG PO TABS: 1.0000 | ORAL_TABLET | Freq: Four times a day (QID) | ORAL | Status: DC | PRN

## 2013-03-11 NOTE — ED Notes (Signed)
MD aware of BP

## 2013-03-11 NOTE — ED Notes (Signed)
Pt IV flushed with 7ml of NS prior to morphine administration. After morphine administration, IV flushed with 3 ml of NS and small area of edema noted proximal to IV site. IV d/c.

## 2013-03-11 NOTE — ED Notes (Signed)
Per pt, was "recently diagnosed with MS." Pt states experiencing tingling in legs for a few months and numbness since last night. Pt states LOC x3 witnessed by daughter this am. Pt reports frontal h/a since last night and worsened. Pt states with movement "shooting like pain from neck through body". Pt denies any other symptoms at present time.

## 2013-03-11 NOTE — ED Provider Notes (Signed)
CSN: [id number]     Arrival date & time 03/11/13  1125 History   First MD Initiated Contact with Patient 03/11/13 1144     Chief Complaint  Patient presents with  . Loss of Consciousness   (Consider location/radiation/quality/duration/timing/severity/associated sxs/prior Treatment) Patient is a 27 y.o. female presenting with neurologic complaint. The history is provided by the patient.  Neurologic Problem This is a new problem. The current episode started 3 to 5 hours ago. Episode frequency: 3 times today. The problem has been resolved. Associated symptoms include headaches. Pertinent negatives include no chest pain, no abdominal pain and no shortness of breath. Nothing aggravates the symptoms. Nothing relieves the symptoms. She has tried nothing for the symptoms. The treatment provided no relief.    Past Medical History  Diagnosis Date  . Preterm labor   . Gonorrhea   . Abnormal Pap smear     Colpo>normal  . Urinary tract infection   . Ovarian cyst   . Neuromuscular disorder     diagnosed with MS this visit  . Multiple sclerosis   . Obesity    Past Surgical History  Procedure Laterality Date  . Tubal ligation      2012  . Colposcopy     Family History  Problem Relation Age of Onset  . Anesthesia problems Neg Hx   . Other Neg Hx   . Diabetes Mother   . Hypertension Mother   . Diabetes Father   . Hypertension Father    History  Substance Use Topics  . Smoking status: Never Smoker   . Smokeless tobacco: Never Used  . Alcohol Use: No     Comment: none since February 01, 2013 (diagnosed with MS)    OB History   Grav Para Term Preterm Abortions TAB SAB Ect Mult Living   6 3 1 2 3 3   1 4      Review of Systems  Respiratory: Negative for shortness of breath.   Cardiovascular: Negative for chest pain.  Gastrointestinal: Negative for abdominal pain.  Neurological: Positive for headaches.  All other systems reviewed and are negative.    Allergies  Review of  patient's allergies indicates no known allergies.  Home Medications   Current Outpatient Rx  Name  Route  Sig  Dispense  Refill  . gabapentin (NEURONTIN) 400 MG capsule   Oral   Take 1 capsule (400 mg total) by mouth 3 (three) times daily.   90 capsule   1    BP 180/111  Pulse 66  Temp(Src) 98.7 F (37.1 C) (Oral)  Resp 18  Ht 5\' 4"  (1.626 m)  Wt 221 lb (100.245 kg)  BMI 37.92 kg/m2  SpO2 100%  LMP 02/22/2013 Physical Exam  Nursing note and vitals reviewed. Constitutional: She is oriented to person, place, and time. She appears well-developed and well-nourished. No distress.  HENT:  Head: Normocephalic and atraumatic.  Eyes: EOM are normal. Pupils are equal, round, and reactive to light.  Neck: Normal range of motion. Neck supple.  Cardiovascular: Normal rate and regular rhythm.  Exam reveals no friction rub.   No murmur heard. Pulmonary/Chest: Effort normal and breath sounds normal. No respiratory distress. She has no wheezes. She has no rales.  Abdominal: Soft. She exhibits no distension. There is no tenderness. There is no rebound.  Musculoskeletal: Normal range of motion. She exhibits no edema.  Neurological: She is alert and oriented to person, place, and time. No cranial nerve deficit. She exhibits abnormal muscle tone (3/5  strength in lower extremities). Coordination normal.  Skin: She is not diaphoretic.    ED Course  Procedures (including critical care time) Labs Review Labs Reviewed  GLUCOSE, CAPILLARY  POCT I-STAT, CHEM 8  POCT PREGNANCY, URINE   Imaging Review Mr Lodema Pilot Contrast  03/11/2013   CLINICAL DATA:  27 year old female with recent diagnosis of multiple sclerosis. Extremity tingling, bilateral lower extremity weakness pain and difficulty walking. Loss of consciousness.  EXAM: MRI HEAD WITHOUT AND WITH CONTRAST  TECHNIQUE: Multiplanar, multiecho pulse sequences of the brain and surrounding structures were obtained according to standard protocol  without and with intravenous contrast  CONTRAST:  20mL MULTIHANCE GADOBENATE DIMEGLUMINE 529 MG/ML IV SOLN  COMPARISON:  Brain MRI 02/01/2013.  FINDINGS: Stable and normal cerebral volume. No restricted diffusion to suggest acute infarction. No midline shift, mass effect, evidence of mass lesion, ventriculomegaly, extra-axial collection or acute intracranial hemorrhage. Cervicomedullary junction and pituitary are within normal limits. Grossly negative visualized cervical spine. Major intracranial vascular flow voids are stable. Diminutive posterior circulation again noted.  Fewer scattered periventricular white matter T2 and FLAIR hyperintense lesions are visible today on axial FLAIR imaging. Sagittal FLAIR images likewise show decreased white matter lesions. Right periatrial white matter involvement now predominates. No new lesions identified. No definite enhancing lesions today.  Deep gray matter nuclei, brainstem and cerebellum remain normal. Grossly normal visualized internal auditory structures.  Normal bone marrow signal. Mastoids are clear. Paranasal sinuses are clear. Orbits soft tissues within normal limits. Negative scalp soft tissues.  IMPRESSION: 1. Interval regression of white matter lesions thought to reflect demyelinating disease. No new or enhancing lesion identified today.  2. No new intracranial abnormality.   Electronically Signed   By: Augusto Gamble M.D.   On: 03/11/2013 14:19     Date: 03/11/2013  Rate: 67  Rhythm: normal sinus rhythm  QRS Axis: normal  Intervals: normal  ST/T Wave abnormalities: normal  Conduction Disutrbances:none  Narrative Interpretation:   Old EKG Reviewed: unchanged   MDM   1. Weakness   2. Multiple sclerosis    28 year old female with recent diagnosis of multiple sclerosis presents with presyncopal episodes. Patient had 3 syncopal episodes A. with no preceding symptoms. She states her daughter found her on the floor this morning. Patient does room awaken  him and walking but does remember anything after that. She also is complaining of a headache has been going on for one week. Is gradually gotten worse with worse symptoms last night. She also reports improving for the past day or 2. She states her vision is fuzzy, she has normal color and can read, but is not as clear as normal. She denies any double vision. She denies any fevers, vomiting. She has been on steroid taper recently and is also not started her Copaxone that she supposed to take for her MS. She's also complaining of bilateral leg numbness that is worse since MS was diagnosed. Her MS began with leg numbness, but the patient states this is the worst numbness she's had. Here, she is hypertensive, vitals are stable. She has 3/5 strength in her lower chimney. She cannot move her legs well and cannot ambulate easily due to the weakness. She has normal cranial nerves and normal strength and sensation in her upper extremities. Consult neurology to see which imaging may like to pursue. MRI with improving lesions. No new lesions. Patient ambulatory, able to walk, has help at home. No need to admission at this time. Patient stable for discharge.  Instructed to f/u with Neurology.     Dagmar Hait, MD 03/13/13 [phone]

## 2013-03-11 NOTE — ED Notes (Signed)
Patient transported to MRI 

## 2013-03-13 ENCOUNTER — Ambulatory Visit (INDEPENDENT_AMBULATORY_CARE_PROVIDER_SITE_OTHER): Payer: 59 | Admitting: Family Medicine

## 2013-03-13 ENCOUNTER — Encounter: Payer: Self-pay | Admitting: Clinical

## 2013-03-13 VITALS — BP 130/84 | HR 76 | Temp 98.3°F | Ht 64.0 in | Wt 224.0 lb

## 2013-03-13 DIAGNOSIS — G35 Multiple sclerosis: Secondary | ICD-10-CM | POA: Insufficient documentation

## 2013-03-13 DIAGNOSIS — F4323 Adjustment disorder with mixed anxiety and depressed mood: Secondary | ICD-10-CM

## 2013-03-13 NOTE — Patient Instructions (Signed)
RESOURCE GUIDE ° °Chronic Pain Problems: °Contact [facility]  [phone] °Patients need to be referred by their primary care doctor. ° °Insufficient Money for Medicine: °Contact United Way:  call (888) 892-1162 ° °No Primary Care Doctor: °- Call [facility]  [phone] - can help you locate a primary care doctor that  accepts your insurance, provides certain services, etc. °- Physician Referral Service- 1-800-533-3463 ° °Agencies that provide inexpensive medical care: °- [facility]  [phone] °- [facility]  [phone] °- [facility]  [phone] °- [facility]  832-4777 °- [facility]  [phone] °- [facility]  272-1050 ° °Medicaid-accepting [place] Providers: °- [facility]- [address] ° [phone], Mon-Fri 9am-7pm, Sat 9am-1pm °- [facility]- [address], [address] ° [phone] °- [facility]- [address], [address] ° [phone] °- [facility]- [address] [place] [address] ° [phone] °- Veita Bland- [address], [phone] ° Only accepts [place] Access Medicaid patients after they have their name  applied to their card ° °Self Pay (no insurance) in [place]: °- Sickle Cell Patients - [facility] ° [address], [phone] °- [facility]- [address] ° [phone] °      -     [facility]- [address] °      -     [facility]- see information above (Speak to Pam H if you do not have insurance) °      -  [facility] [place]- [address],  [phone] °      -  [facility]- [address] [place] [address], [phone] °      -  Dr Osei-Bonsu-  [address], [place], [phone] °      -  [facility] - [address], [phone] °      -  [facility]- [address] [place] [address], [phone], also  [address], [phone] °      -     [facility]- [address], [phone], 1st & 3rd Saturday °        every month, 10am-1pm ° -     [facility] °  [address], [place]. °  Phone:  [phone], Fax:  [phone]. Hours of Operation:  9 am - 6 pm, M-F. ° -     [facility] °  [address], [place] °  Phone: [phone], Fax: [phone]. Hours of Operation:  8:30 am - 5:30 pm, M-F. ° °[facility] °[address] °, [place] [address] °(336) 832-4777 ° °[facility] °[address] °[place], [place] [address] °(336) [phone] ° °1) Find a Doctor and Pay Out of Pocket °Although you won't have to find out who is covered by your insurance plan, it is a good idea to ask around and get recommendations. You will then need to call the office and see if the doctor you have chosen will accept you as a new patient and what types of options they offer for patients who are self-pay. Some doctors offer discounts or will set up payment plans for their patients who do not have insurance, but   you will need to ask so you aren't surprised when you get to your appointment. ° °2) Contact Your Local Health Department °Not all health departments have doctors that can see patients for sick visits, but many do, so it is worth a call to see if yours does. If you don't know where your local health department is, you can check in your phone book. The CDC also has a tool to help you locate your state's health department, and many state websites also have listings of all of their local health departments. ° °3) Find a Walk-in Clinic °If your illness is not likely to be very severe or complicated, you may want to try a walk in clinic. These are popping up all over the country in pharmacies, drugstores, and shopping centers. They're usually staffed by nurse practitioners or physician assistants that have been trained  to treat common illnesses and complaints. They're usually fairly quick and inexpensive. However, if you have serious medical issues or chronic medical problems, these are probably not your best option ° °STD Testing °- [place] [facility] [place], [address], [place], phone [phone] or [phone].  Monday - Friday, call for an appointment. °- [place] [facility] [place], [facility], [address], [place], phone [phone] or [phone].  Monday - Friday, call for an appointment. ° °Abuse/Neglect: °- [place] Child Abuse Hotline (336) 641-3795 °- [place] Child Abuse Hotline 800-378-5315 (After Hours) ° °Emergency Shelter:  [facility] (336) 271-5985 ° °Maternity Homes: °- [facility] (336) 275-9566 °- [facility] (704) 372-4663 ° °MRSA Hotline #:   832-7006 ° °Dental Assistance °If unable to pay or uninsured, contact:  [place] [facility] to become qualified for the adult dental clinic. ° °Patients with Medicaid: [facility] °[address], [phone] °[address], [phone] ° °If unable to pay, or uninsured, contact [place] [facility] ([phone] in [place], 842-7733 in [place]) to become qualified for the adult dental clinic ° °[facility] °[address] °[place], [place] [address] °(336) 272-4177 °www.[facility].com ° °Other Low-Cost Community Dental Services: °- [facility]- [address], [place], [place], [address], [phone], Ext. 123, 2nd and 4th Thursday of the month at 6:30am.  10 clients each day by appointment, can sometimes see walk-in patients if someone does not show for an appointment. °- [facility]- [address], [place], [place], [address], [phone] °- [facility]- [address], [place], [place], [address], [phone] °- [place]  [facility]- 342-8273 °- [facility]- 703-3100 °- [facility]- 570-6415 ° °     Behavioral Health Resources in the Community ° °Intensive Outpatient Programs: °[facility]      °[address] °[place], [place] °336-878-6098 °Both a day and evening program °      °[facility]     °[address]        °[place], [place] [address] °336-832-9800        ° °[facility] °[address] °[place] °336-882-2125 ° °[place] [facility] °ACCESS LINE: 1-800-853-5163 or 336-641-4981 °[address] °[place],  [address] °Http://www.[email or web address].[email or web address]/adult.htm ° ° °Substance Abuse Resources: °- [facility]  336-882-2125 °- [facility] 336-784-9470 °- [facility] 336-285-9073 °- [facility] 336-845-3988 °-   Residential & Outpatient Substance Abuse Program  800-659-3381 ° °Psychological Services: °- [facility]  832-9600 °- [facility]  378-7881 °- [facility], [address], [place], ACCESS LINE: 1-800-853-5163 or 336-641-4981, Http://www.[facility].com/services/adult.htm ° °Mobile Crisis Teams:         °                               °[facility]         °Mobile Crisis Care Unit °1-877-626-1772       °      °Assertive °[facility] °[address] [place] °336-834-9664 °                                        °Interventionist °Sharon DeEsch °[address] °[place] °336-554-5454 ° °Self-Help/Support Groups: °[facility] Variety of support groups °373-1402 (call for more info) ° °Narcotics Anonymous (NA) °[facility] °[address] °[place] - 2 meetings at this location ° °Residential Treatment Programs:  °[facility]      °[address]        °[place]       °866-801-8205        ° °[facility] °[address] °[place], [place]  [address] °704-293-8524 ° °[facility]  °[address] °[place], [place] [address] °336-845-3988 °Admissions: 8am-3pm M-F ° °[facility]     °[address]        °[place], [place] [address]       °336-841-1104        ° °[facility] °[address] °[place], [place] °336-379-7146 ° °[facility] °[address] °[place] °336-285-9073 ° °[facility] - Intensive Outpatient      °[address]     °[place], [place]       °852-3033        ° °[facility]     °[address] °[place], [place] °877-615-2722 or 336-784-9470 ° °[facility] ([facility]), Medicaid °[address] °[place], [place] °336-227-7417 ° °[facility]                                               °[address] ° [place] °800-659-3381 ° °[place] [facility]: °[facility]- 1-888-581-9988              ° °General Therapy                                                °Julie Brannon, PhD        °[address]                                       °[place], [place] [address]         °336-349-5553   °Insurance ° °[facility]   °[address] °[place], [place] [address] °336-349-4454 ° °[facility] °[address] [place], [place] [address] °336-342-8316 °Insurance/Medicaid/sponsorship through [facility] ° °[facility]                                              °[address]                                        °[place], [place] [address]    °Therapy/tele-psych/case         °336-342-8316        °  °[facility] °[address]  ° [place], [place]  [address]  °Adolescent/group home/case management °336-349-2233  °                                         °Julia Brannon PhD       °General therapy       °Insurance   °336-951-0000        ° °Dr. Arfeen, Insurance, M-F °336- 349-4544 ° °[facility] [place]  [facility]  [employer] [place] [facility] °[address]                 [address]         [address]  °[place]                                               [place] °Phone:  349-3220                                  Phone:  [phone]                   Phone:  342-8140 ° °[facility], 342-8316 °- [place] [facility] - [facility]- 1-888-581-9988 °      -     [facility] in [place], [address], °            336-349-4454, Insurance ° °[place] Child Abuse Hotline °(336) 342-1394 or (336) 342-3537 (After Hours) ° ° °

## 2013-03-13 NOTE — Assessment & Plan Note (Addendum)
Pt with recent Dx MS as of 02/01/13 after hospitalization and MRI/CSF showing periventricular hyperintensity as well as oligoclonal bands.  She recently was seen in the ED due to fall and pre-syncopal episode two days ago (9/27) and had extensive w/u with MRI that showed possible decrease in her periventricular hyperintensity.  Was sent home with f/u today and has been stable since that point.  Pt Rx Copaxone about two weeks ago, and only filled it yesterday, starting to take it today.  She has had problem with bowel/bladder incontinence and retention, as well as blurred vision, and lower extremity weakness.  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  Due to ongoing financial struggles, currently not having insurance, and 4 children, discussed case with our Child psychotherapist, Theresia Bough.  Greatly appreciate her recommendations and evaluation, and she will look into possible judicial issue along with the insurance issues.  In all, she will need specialist f/u with neurology for optimal management of her MS.  I spent > 25 minutes face to face, > 50% included counseling patient, reviewing old records of her hospitalization, recommendations, and resources.

## 2013-03-13 NOTE — Progress Notes (Signed)
[name] is a 27 y.o. female who presents today for hospital f/u from recent pre-syncopal episode.    Seen in the ED on 9/17 for pre-syncopal episode with 3 previous episodes in the past couple of months.  Denies any trauma to her head or HA.  She does note increase in blurred vision over the past couple of weeks, and admits to not having her copaxone refilled.  Does not c/co diplopia, fevers, vomiting, change in visual color.  B/L numbness, weakness of her legs is stable and unchanged from ED visit.  MRI showed possible improvement from two days ago as well.  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  Has not been able to f/u with neurology due to cost and no insurance.    Past Medical History  Diagnosis Date  . Preterm labor   . Gonorrhea   . Abnormal Pap smear     Colpo>normal  . Urinary tract infection   . Ovarian cyst   . Neuromuscular disorder     diagnosed with MS this visit  . Multiple sclerosis   . Obesity     History  Smoking status  . Never Smoker   Smokeless tobacco  . Never Used    Family History  Problem Relation Age of Onset  . Anesthesia problems Neg Hx   . Other Neg Hx   . Diabetes Mother   . Hypertension Mother   . Diabetes Father   . Hypertension Father     Current Outpatient Prescriptions on File Prior to Visit  Medication Sig Dispense Refill  . gabapentin (NEURONTIN) 400 MG capsule Take 1 capsule (400 mg total) by mouth 3 (three) times daily.  90 capsule  1  . HYDROcodone-acetaminophen (NORCO/VICODIN) 5-325 MG per tablet Take 1 tablet by mouth every 6 (six) hours as needed for pain.  20 tablet  0   No current facility-administered medications on file prior to visit.    ROS: Per HPI.  All other systems reviewed and are negative.   Physical Exam Filed Vitals:   03/13/13 1519  BP: 130/84  Pulse: 76  Temp: 98.3 F (36.8 C)     Physical Examination: General appearance - alert, well appearing, and in no distress Mental status - alert, oriented to person, place, and time Neck - supple, no significant adenopathy Heart - normal rate and regular rhythm, no murmurs noted Neurological - Upper extremity MS 5/5 B/L, CN 2-12 intact, no internuclear ophthalmoplegia, 3/5 MS B/L LE, +3/4 DTR Patellar reflex B/L LE   Musculoskeletal - full range of motion without pain  03/11/13 MRI HEAD WITHOUT AND WITH CONTRAST  TECHNIQUE:  Multiplanar, multiecho pulse sequences of the brain and surrounding  structures were obtained according to standard protocol without and  with intravenous contrast  CONTRAST: 20mL MULTIHANCE GADOBENATE DIMEGLUMINE 529 MG/ML IV SOLN  COMPARISON: Brain MRI 02/01/2013.  FINDINGS:  Stable and normal cerebral volume. No restricted diffusion to  suggest acute infarction. No midline shift, mass effect, evidence of  mass lesion, ventriculomegaly, extra-axial collection or acute  intracranial hemorrhage. Cervicomedullary junction and pituitary are  within normal limits. Grossly negative visualized cervical spine.  Major intracranial vascular flow voids are stable. Diminutive  posterior circulation again noted.  Fewer scattered periventricular white matter T2 and FLAIR  hyperintense lesions are visible today on axial FLAIR imaging.  Sagittal FLAIR images likewise show decreased white matter  lesions.  Right periatrial white matter involvement now predominates. No new  lesions identified. No definite enhancing lesions today.  Deep gray matter nuclei, brainstem and cerebellum remain normal.  Grossly normal visualized internal auditory structures.  Normal bone marrow signal. Mastoids are clear. Paranasal sinuses are  clear. Orbits soft tissues within normal limits. Negative scalp soft  tissues.  IMPRESSION:  1. Interval regression of white matter lesions thought to reflect  demyelinating disease. No new or  enhancing lesion identified today.

## 2013-03-13 NOTE — Assessment & Plan Note (Signed)
Secondary to recent Dx of MS.  Denies SI/HI, and may be interested in discussing with psychiatry vs psychology in future.  Will think about this and get back about interest.

## 2013-03-13 NOTE — Progress Notes (Signed)
Clinical Child psychotherapist (CSW) met with pt after visit with her PCP. CSW informed that pt has been newly diagnosed with MS and terminated from her employer [employer]) due to no longer being able to drive. CSW validated pt feelings of frustration and worry with how she will pay her bills and take care of her 4 children. CSW explored whether pt has applied for Medicaid/Disability. Pt confirmed she applied for Medicaid on 03/10/13 and has a phone interview with Social Security for Disability on 110/21/2014. CSW informed pt of information that she will need to have when meeting with a disability worker (records and any letters from her physicians.) CSW also confirmed that pt is going to contact DSS regarding her recent job loss which should increase her food stamps. CSW also suggested pt think about counseling as pt is experiencing a life changing event which pt stated is bringing upon many emotions. CSW encouraged pt to discuss her feelings either with CSW (can schedule appt) or with another therapist. Pt agrees and is open to meeting with someone. CSWprovided pt with contact  Information for [employer] [place] to obtain legal advice pertaining to her termination at her job due to her illness. CSW also provided pt with scholarship information on taking water aerobics classes ([employer]) as pt is interested in doing some form of water therapy. CSW provided pt with resources for [employer] to assist with emergency financial assistance, pt aware and agreeable to use resource if need be. Pt confirms her children are safe and being watched by her mother. Pt at this moment has applied for necessary resources and will await a decision on Medicaid/Disability. CSW provided pt with CSW contact information as CSW remains available for additional support.   CSW discussed case with pt PCP.   Theresia Bough, MSW, LCSW [phone]

## 2013-03-14 ENCOUNTER — Other Ambulatory Visit: Payer: Self-pay | Admitting: Family Medicine

## 2013-03-14 ENCOUNTER — Telehealth: Payer: Self-pay | Admitting: Family Medicine

## 2013-03-14 ENCOUNTER — Telehealth: Payer: Self-pay | Admitting: Clinical

## 2013-03-14 DIAGNOSIS — G35 Multiple sclerosis: Secondary | ICD-10-CM

## 2013-03-14 NOTE — Telephone Encounter (Signed)
Clinical Child psychotherapist (CSW) received a call this morning from pt stating she was informed by DSS yesterday afternoon that her Medicaid has been approved. Pt is requesting a referral from PCP to see a specialist at [facility]. CSW informed pt to contact the FM main number and request for RN to notify MD & that CSW would do the same. Pt very appreciative.  Theresia Bough, MSW, LCSW [phone]

## 2013-03-14 NOTE — Telephone Encounter (Signed)
Pt is requesting a referral to [facility] for an appt for her MS dx.  Nelva Bush, the SW for [facility], spoke with patient yesterday and was going to send her request for patient to be seen there for condition.

## 2013-03-15 NOTE — Telephone Encounter (Signed)
I had put in a referral with instructions at the bottom to refer to [facility] neurology, per patient.  When I go into appointments for this request, however, it shows that it is referring her to [facility].  Can we fix that, or do you need another order?  Thanks, Twana First. Paulina Fusi, DO of [facility] 03/15/2013, 5:28 PM

## 2013-03-17 NOTE — Telephone Encounter (Signed)
Will schedule appointment with a Neurology at [facility]. [name], [name]

## 2013-03-19 ENCOUNTER — Encounter (HOSPITAL_COMMUNITY): Payer: Self-pay | Admitting: Emergency Medicine

## 2013-03-19 ENCOUNTER — Inpatient Hospital Stay (HOSPITAL_COMMUNITY)
Admission: EM | Admit: 2013-03-19 | Discharge: 2013-03-20 | DRG: 060 | Disposition: A | Discharge status: Home Health | Payer: Medicaid Other | Attending: Family Medicine | Admitting: Family Medicine

## 2013-03-19 ENCOUNTER — Inpatient Hospital Stay (HOSPITAL_COMMUNITY): Payer: Medicaid Other

## 2013-03-19 DIAGNOSIS — Z23 Encounter for immunization: Secondary | ICD-10-CM

## 2013-03-19 DIAGNOSIS — Z8249 Family history of ischemic heart disease and other diseases of the circulatory system: Secondary | ICD-10-CM

## 2013-03-19 DIAGNOSIS — E669 Obesity, unspecified: Secondary | ICD-10-CM | POA: Diagnosis present

## 2013-03-19 DIAGNOSIS — R29898 Other symptoms and signs involving the musculoskeletal system: Secondary | ICD-10-CM

## 2013-03-19 DIAGNOSIS — Z833 Family history of diabetes mellitus: Secondary | ICD-10-CM

## 2013-03-19 DIAGNOSIS — F4323 Adjustment disorder with mixed anxiety and depressed mood: Secondary | ICD-10-CM | POA: Diagnosis present

## 2013-03-19 DIAGNOSIS — G35 Multiple sclerosis: Principal | ICD-10-CM | POA: Diagnosis present

## 2013-03-19 DIAGNOSIS — Z6837 Body mass index (BMI) 37.0-37.9, adult: Secondary | ICD-10-CM

## 2013-03-19 DIAGNOSIS — Z87440 Personal history of urinary (tract) infections: Secondary | ICD-10-CM

## 2013-03-19 LAB — CBC WITH DIFFERENTIAL/PLATELET
Basophils Absolute: 0 10*3/uL (ref 0.0–0.1)
Basophils Relative: 1 % (ref 0–1)
Eosinophils Absolute: 0 10*3/uL (ref 0.0–0.7)
Eosinophils Relative: 1 % (ref 0–5)
MCH: 31.5 pg (ref 26.0–34.0)
MCHC: 33.7 g/dL (ref 30.0–36.0)
MCV: 93.5 fL (ref 78.0–100.0)
Platelets: 348 10*3/uL (ref 150–400)
RDW: 12.3 % (ref 11.5–15.5)

## 2013-03-19 LAB — BASIC METABOLIC PANEL
BUN: 12 mg/dL (ref 6–23)
CO2: 27 mEq/L (ref 19–32)
Calcium: 9.3 mg/dL (ref 8.4–10.5)
GFR calc non Af Amer: >90 mL/min (ref >90)
Potassium: 3.7 mEq/L (ref 3.5–5.1)
Sodium: 137 mEq/L (ref 135–145)

## 2013-03-19 MED ORDER — SODIUM CHLORIDE 0.9 % IJ SOLN: 3.0000 mL | INTRAMUSCULAR | Status: DC | PRN

## 2013-03-19 MED ORDER — INFLUENZA VAC SPLIT QUAD 0.5 ML IM SUSP
0.5000 mL | INTRAMUSCULAR | Status: AC
  Administered 2013-03-20: 0.5 mL via INTRAMUSCULAR
  Filled 2013-03-19: qty 0.5

## 2013-03-19 MED ORDER — OXYCODONE HCL 5 MG PO TABS
5.0000 mg | ORAL_TABLET | Freq: Four times a day (QID) | ORAL | Status: DC | PRN
  Administered 2013-03-19 – 2013-03-20 (×4): 5 mg via ORAL
  Filled 2013-03-19 (×4): qty 1

## 2013-03-19 MED ORDER — GLATIRAMER ACETATE 20 MG/ML ~~LOC~~ KIT
20.0000 mg | PACK | Freq: Every day | SUBCUTANEOUS | Status: DC
  Filled 2013-03-19 (×2): qty 1

## 2013-03-19 MED ORDER — GABAPENTIN 400 MG PO CAPS
400.0000 mg | ORAL_CAPSULE | Freq: Three times a day (TID) | ORAL | Status: DC
  Administered 2013-03-19 – 2013-03-20 (×4): 400 mg via ORAL
  Filled 2013-03-19 (×5): qty 1

## 2013-03-19 MED ORDER — DOCUSATE SODIUM 100 MG PO CAPS
100.0000 mg | ORAL_CAPSULE | Freq: Two times a day (BID) | ORAL | Status: DC
  Administered 2013-03-19 – 2013-03-20 (×3): 100 mg via ORAL
  Filled 2013-03-19 (×3): qty 1

## 2013-03-19 MED ORDER — PANTOPRAZOLE SODIUM 40 MG IV SOLR
40.0000 mg | Freq: Every day | INTRAVENOUS | Status: DC
  Administered 2013-03-19: 40 mg via INTRAVENOUS
  Filled 2013-03-19 (×2): qty 40

## 2013-03-19 MED ORDER — GADOBENATE DIMEGLUMINE 529 MG/ML IV SOLN
20.0000 mL | Freq: Once | INTRAVENOUS | Status: AC | PRN
  Administered 2013-03-19: 17:00:00 20 mL via INTRAVENOUS

## 2013-03-19 MED ORDER — SODIUM CHLORIDE 0.9 % IJ SOLN
3.0000 mL | Freq: Two times a day (BID) | INTRAMUSCULAR | Status: DC
  Administered 2013-03-19 – 2013-03-20 (×3): 3 mL via INTRAVENOUS

## 2013-03-19 MED ORDER — SODIUM CHLORIDE 0.9 % IV SOLN
500.0000 mg | Freq: Once | INTRAVENOUS | Status: AC
  Administered 2013-03-19: 14:00:00 500 mg via INTRAVENOUS
  Filled 2013-03-19: qty 4

## 2013-03-19 MED ORDER — HEPARIN SODIUM (PORCINE) 5000 UNIT/ML IJ SOLN
5000.0000 [IU] | Freq: Three times a day (TID) | INTRAMUSCULAR | Status: DC
  Administered 2013-03-19 – 2013-03-20 (×4): 5000 [IU] via SUBCUTANEOUS
  Filled 2013-03-19 (×7): qty 1

## 2013-03-19 MED ORDER — ONDANSETRON HCL 4 MG/2ML IJ SOLN
4.0000 mg | Freq: Once | INTRAMUSCULAR | Status: AC
  Administered 2013-03-19: 4 mg via INTRAVENOUS
  Filled 2013-03-19: qty 2

## 2013-03-19 MED ORDER — ONDANSETRON HCL 4 MG/2ML IJ SOLN: 4.0000 mg | Freq: Three times a day (TID) | INTRAMUSCULAR | Status: AC | PRN

## 2013-03-19 MED ORDER — HYDROMORPHONE HCL PF 1 MG/ML IJ SOLN
1.0000 mg | Freq: Once | INTRAMUSCULAR | Status: AC
  Administered 2013-03-19: 1 mg via INTRAVENOUS
  Filled 2013-03-19: qty 1

## 2013-03-19 MED ORDER — HYDROCODONE-ACETAMINOPHEN 5-325 MG PO TABS: 1.0000 | ORAL_TABLET | Freq: Four times a day (QID) | ORAL | Status: DC | PRN

## 2013-03-19 MED ORDER — SODIUM CHLORIDE 0.9 % IV SOLN: 250.0000 mL | INTRAVENOUS | Status: DC | PRN

## 2013-03-19 MED ORDER — MAGNESIUM HYDROXIDE 400 MG/5ML PO SUSP: 30.0000 mL | Freq: Every day | ORAL | Status: DC | PRN

## 2013-03-19 NOTE — ED Notes (Signed)
MD at bedside. 

## 2013-03-19 NOTE — ED Provider Notes (Signed)
CSN: [id number]     Arrival date & time 03/19/13  1041 History   First MD Initiated Contact with Patient 03/19/13 1106     Chief Complaint  Patient presents with  . Numbness   (Consider location/radiation/quality/duration/timing/severity/associated sxs/prior Treatment) HPI Comments: Patients with diagnosis of multiple sclerosis at the end of 01/2013, lesions in brain and spinal cord -- presents with complaint of continuing bilateral lower extremity paresthesias described as electrical shocks as well as progressively worsening lower extremity weakness. Patient is having difficulty walking with a walker. She was able to walk normally at the time of her diagnosis. She was gradually tapered off of steroids and she is no longer taking these. She was prescribed Copaxone by her neurologist. She does not feel like this is improving her symptoms. She otherwise denies symptoms of illness. Patient is a single mother of 4 children. MRI performed 1-2 weeks ago showed regression of lesions, she did not have her spinal cord imaged at that time.   The history is provided by the patient and medical records.    Past Medical History  Diagnosis Date  . Preterm labor   . Gonorrhea   . Abnormal Pap smear     Colpo>normal  . Urinary tract infection   . Ovarian cyst   . Neuromuscular disorder     diagnosed with MS this visit  . Multiple sclerosis   . Obesity    Past Surgical History  Procedure Laterality Date  . Tubal ligation      2012  . Colposcopy     Family History  Problem Relation Age of Onset  . Anesthesia problems Neg Hx   . Other Neg Hx   . Diabetes Mother   . Hypertension Mother   . Diabetes Father   . Hypertension Father    History  Substance Use Topics  . Smoking status: Never Smoker   . Smokeless tobacco: Never Used  . Alcohol Use: No     Comment: none since February 01, 2013 (diagnosed with MS)    OB History   Grav Para Term Preterm Abortions TAB SAB Ect Mult Living   6 3 1 2 3  3   1 4      Review of Systems  Constitutional: Negative for fever.  HENT: Negative for congestion, rhinorrhea, neck pain, neck stiffness, dental problem and sinus pressure.   Eyes: Negative for photophobia, discharge and redness.  Respiratory: Negative for shortness of breath.   Cardiovascular: Negative for chest pain and leg swelling.  Gastrointestinal: Negative for nausea and vomiting.  Musculoskeletal: Positive for myalgias and gait problem. Negative for arthralgias.  Skin: Negative for rash.  Neurological: Positive for weakness and numbness. Negative for syncope, speech difficulty, light-headedness and headaches.  Psychiatric/Behavioral: Negative for confusion.    Allergies  Review of patient's allergies indicates no known allergies.  Home Medications   Current Outpatient Rx  Name  Route  Sig  Dispense  Refill  . gabapentin (NEURONTIN) 400 MG capsule   Oral   Take 1 capsule (400 mg total) by mouth 3 (three) times daily.   90 capsule   1   . glatiramer (COPAXONE) 20 MG/ML injection   Subcutaneous   Inject 20 mg into the skin daily.         Marland Kitchen HYDROcodone-acetaminophen (NORCO/VICODIN) 5-325 MG per tablet   Oral   Take 1 tablet by mouth every 6 (six) hours as needed for pain.   20 tablet   0    BP 138/92  Pulse 68  Temp(Src) 98.2 F (36.8 C) (Oral)  Resp 22  Wt 220 lb (99.791 kg)  BMI 37.74 kg/m2  SpO2 100%  LMP 03/13/2013 Physical Exam  Nursing note and vitals reviewed. Constitutional: She is oriented to person, place, and time. She appears well-developed and well-nourished.  HENT:  Head: Normocephalic and atraumatic.  Right Ear: Tympanic membrane, external ear and ear canal normal.  Left Ear: Tympanic membrane, external ear and ear canal normal.  Nose: Nose normal.  Mouth/Throat: Uvula is midline, oropharynx is clear and moist and mucous membranes are normal.  Eyes: Conjunctivae, EOM and lids are normal. Pupils are equal, round, and reactive to light.  Right eye exhibits no nystagmus. Left eye exhibits no nystagmus.  Neck: Normal range of motion. Neck supple.  Cardiovascular: Normal rate and regular rhythm.   Pulmonary/Chest: Effort normal and breath sounds normal.  Abdominal: Soft. There is no tenderness.  Musculoskeletal:       Cervical back: She exhibits normal range of motion, no tenderness and no bony tenderness.  Neurological: She is alert and oriented to person, place, and time. She has normal reflexes. A sensory deficit is present. No cranial nerve deficit. She exhibits normal muscle tone. Coordination normal. GCS eye subscore is 4. GCS verbal subscore is 5. GCS motor subscore is 6.  3/5 strength R lower extremity, 3+/5 strength L lower extremity  Skin: Skin is warm and dry.  Psychiatric: She has a normal mood and affect.    ED Course  Procedures (including critical care time) Labs Review Labs Reviewed  CBC WITH DIFFERENTIAL - Abnormal; Notable for the following:    RBC 3.84 (*)    HCT 35.9 (*)    All other components within normal limits  BASIC METABOLIC PANEL  GLUCOSE, CAPILLARY  URINALYSIS, ROUTINE W REFLEX MICROSCOPIC  ANA  SJOGRENS SYNDROME-A EXTRACTABLE NUCLEAR ANTIBODY  SJOGRENS SYNDROME-B EXTRACTABLE NUCLEAR ANTIBODY   Imaging Review No results found.  12:08 PM Patient seen and examined. Work-up initiated. Spoke with Dr. Amada Jupiter of neurology who will see. Medications ordered. Will need admission for worsening MS symptoms.   Vital signs reviewed and are as follows: Filed Vitals:   03/19/13 1054  BP: 138/92  Pulse: 68  Temp: 98.2 F (36.8 C)  Resp:    Spoke with [facility] who will see for admission.    MDM   1. Multiple sclerosis exacerbation   2. Lower extremity weakness   3. Multiple sclerosis    Admit for above.     Renne Crigler, PA-C 03/19/13 [phone]

## 2013-03-19 NOTE — ED Notes (Signed)
Neuro MD at bedside

## 2013-03-19 NOTE — ED Provider Notes (Signed)
Medical screening examination/treatment/procedure(s) were performed by non-physician practitioner and as supervising physician I was immediately available for consultation/collaboration.  Flint Melter, MD 03/19/13 1710

## 2013-03-19 NOTE — Consult Note (Signed)
Neurology Consultation Reason for Consult: Worsening gait Referring Physician: Bernell List  CC: Worsening gait  History is obtained from: Patient  HPI: [name] is a 27 y.o. female with a recent diagnosis of multiple sclerosis that was made with imaging, as well as a local bands in the CSF. She has had a serum ACE which was negative as well as an NMO antibody and Lyme titer.   On presentation she had both enhancing and nonenhancing lesions of the spinal cord and brain making multiple sclerosis certainly the most likely diagnosis.  She had improvement following her initial episode, then more recently has had progressive weakness of her lower extremities. She also has a sharp pain that shoots down her neck when she bends her head forward.   ROS: A 14 point ROS was performed and is negative except as noted in the HPI.  Past Medical History  Diagnosis Date  . Preterm labor   . Gonorrhea   . Abnormal Pap smear     Colpo>normal  . Urinary tract infection   . Ovarian cyst   . Neuromuscular disorder     diagnosed with MS this visit  . Multiple sclerosis   . Obesity     Family History: No family history of autoimmune or neurologic disease  Social History: Tob: Denies  Exam: Current vital signs: BP 138/92  Pulse 68  Temp(Src) 98.2 F (36.8 C) (Oral)  Resp 22  Wt 99.791 kg (220 lb)  BMI 37.74 kg/m2  SpO2 100%  LMP 03/13/2013 Vital signs in last 24 hours: Temp:  [98.2 F (36.8 C)-98.3 F (36.8 C)] 98.2 F (36.8 C) (10/05 1054) Pulse Rate:  [68-75] 68 (10/05 1054) Resp:  [22] 22 (10/05 1044) BP: (130-138)/(74-92) 138/92 mmHg (10/05 1054) SpO2:  [96 %-100 %] 100 % (10/05 1054) Weight:  [99.791 kg (220 lb)] 99.791 kg (220 lb) (10/05 1054)  General: In bed, NAD CV: Regular rate and rhythm Mental Status: Patient is awake, alert, oriented to person, place, month, year, and situation. Immediate and remote memory are intact. Patient is able to give a clear and coherent  history. No signs of aphasia or neglect Cranial Nerves: II: Visual Fields are full. Pupils are equal, round, and reactive to light.  Discs are sharp bilaterally. III,IV, VI: EOMI without ptosis or diploplia.  V: Facial sensation is symmetric to temperature VII: Facial movement is symmetric.  VIII: hearing is intact to voice X: Uvula elevates symmetrically XI: Shoulder shrug is symmetric. XII: tongue is midline without atrophy or fasciculations.  Motor: Tone is normal. Bulk is normal. 5/5 strength was present in the right arm, she has 4/5 weakness in shoulder abduction as well as grip on the left arm, she is 2/5 hip flexion on the right 3/5 on the left Sensory: Sensation is decreased below the lower thoracic level to temperature Deep Tendon Reflexes: 2+ and symmetric in the biceps and patellae.  Cerebellar: FNF intact on right, difficulty on left consistent with weakness Gait: Not tested due to patient safety concerns   I have reviewed labs in epic and the results pertinent to this consultation are: CBC-unremarkable BMP-unremarkable  I have reviewed the images obtained: Previous MRI brain and C-spine shows multiple T2 lesions, with one enhancing lesion.  Impression: 27 year old female with symptoms of lower surety weakness and numbness. multiple sclerosis is by far the most likely diagnosis, but I would like to rule out lupus as well as Sjogren syndrome.  Recommendations: 1) ANA, ssa, ssb 2) UA to  look for infectious reasons for worsening 3) IV Solu-Medrol 500 mg twice a day x5 days 4) continue Copaxone 5) would ensure frequent blood glucose checks at least early on on high-dose steroids 6) GI prophylaxis while on steroids 7) MRI C-spine and T-spine with and without contrast 8) PT, OT   Ritta Slot, MD [facility] [phone]  If 7pm- 7am, please page neurology on call at [phone].

## 2013-03-19 NOTE — H&P (Signed)
Family Medicine Teaching [facility] Admission History and Physical Service Pager: [phone]  Patient name: [name] Medical record number: [id number] Date of birth: 1985/09/21 Age: 27 y.o. Gender: female  Primary Care Provider: Gildardo Cranker, DO Consultants: Neurology Code Status: Full  Chief Complaint: Worsening leg weakness  Assessment and Plan: [name] is a 27 y.o. female presenting with increasing weakness and pain in legs concerning for exacerbation of multiple sclerosis. Marland Kitchen PMH is significant for multiple sclerosis diagnosed in August 2014.   # Multiple sclerosis exacerbation (leg weakness and pain . DDx includes transverse myelitis, auto-immune, infectious causes. Dx during August 2014 hospitalization.  - Neurology consultation appreciated - MRI C-spine and T-spine pending  - Admit for administration of IV steroids - CBG q8h for steroid-induced hyperglycemia - Continue copaxone, gabapentin, vicodin prn - PT/OT  - Pending evaluation and recommendations of PT/OT, may be candidate to receive HH administration of IV steroids at home.  - DDx:  SLE: Will obtain ANA  Sjogren's: SS-A, SS-B  Infectious: U/A  # FEN/GI: Regular diet, SLIV, PPI  # Prophylaxis: Heparin [place]  Disposition: Admit to FMTS, attending Dr. Lum Babe  History of Present Illness: [name] is a 27 y.o. female with recently diagnosed multiple sclerosis presenting with acute worsening of leg pain/weakness and numbness.   [name] was diagnosed with multiple sclerosis on an admission in August of this year and has had 1 flare since then on 02/22/2013.  Patient dates current symptoms back to 2nd ED visit on 03/11/13 but states they have worsened.  Today she complains of electricity-type pain shooting into legs that has continued to get worse over the past 2 weeks, as well as numbness which is nearly complete below the knees which is stable. States she drags her feet when she walks. She has become  progressively more limited with ambulation, and can only get around with the use of a walker most days. She is right-handed, and complains of dropping objects periodically. She reports cold sensation and weakness on both arms. She started copaxone 6 days ago, after visiting the hospital and this has produced some nausea without emesis. She has been incontinent of urine and stool for the past 2 days similar to when she was cared for in august 2014. Denies saddle anesthesia. She has had blurry vision and seeing spots intermittently for the past month or so. She has had intermittent headaches that last several hours and takes vicodin for these, which helps minimally. Caffeine seems to help more (goody powders or sodas.   She describes electrical sensation traveling from her back into her legs when she flexes her neck.  She has trouble with the disability given her mobility limitations, but has been getting help from her mother with her 4 children. She also attends MS support groups.   Review Of Systems:  Otherwise 12 point review of systems was performed and was unremarkable.  Patient Active Problem List   Diagnosis Date Noted  . Adjustment disorder with mixed anxiety and depressed mood 03/13/2013  . Multiple sclerosis 03/13/2013  . Multiple sclerosis exacerbation 02/04/2013  . ABDOMINAL PAIN, CHRONIC 07/29/2007  . OBESITY, NOS 08/12/2006   Past Medical History: Past Medical History  Diagnosis Date  . Preterm labor   . Gonorrhea   . Abnormal Pap smear     Colpo>normal  . Urinary tract infection   . Ovarian cyst   . Neuromuscular disorder     diagnosed with MS this visit  . Multiple sclerosis   .  Obesity   HTN during pregnancy  Past Surgical History: Past Surgical History  Procedure Laterality Date  . Tubal ligation      2012  . Colposcopy     Social History: History  Substance Use Topics  . Smoking status: Never Smoker   . Smokeless tobacco: Never Used  . Alcohol Use: No      Comment: none since February 01, 2013 (diagnosed with MS)   Lives with 4 children. Good support group in her community including mother who lives close.   Please also refer to relevant sections of EMR.  Family History: Family History  Problem Relation Age of Onset  . Anesthesia problems Neg Hx   . Other Neg Hx   . Diabetes Mother   . Hypertension Mother   . Diabetes Father   . Hypertension Father    Allergies and Medications: No Known Allergies No current facility-administered medications on file prior to encounter.   Current Outpatient Prescriptions on File Prior to Encounter  Medication Sig Dispense Refill  . gabapentin (NEURONTIN) 400 MG capsule Take 1 capsule (400 mg total) by mouth 3 (three) times daily.  90 capsule  1  . HYDROcodone-acetaminophen (NORCO/VICODIN) 5-325 MG per tablet Take 1 tablet by mouth every 6 (six) hours as needed for pain.  20 tablet  0    Objective: BP 138/92  Pulse 68  Temp(Src) 98.2 F (36.8 C) (Oral)  Resp 22  Wt 220 lb (99.791 kg)  BMI 37.74 kg/m2  SpO2 100%  LMP 03/13/2013 Exam: General: 27 yo female in NAD HEENT: normal, MMM Cardiovascular: RRR, no M/R/G, 2+ pulses, no edema Respiratory: Nonlabored, CTAB Abdomen: Normoactive BS, soft, NTND Skin: Tattooed on lower back, bilateral arms and hands.  Neuro: AOx3, speech normal, CN II-XII intact, visual fields full to confrontation, gait not assessed, no sensation below knees, diminished on thigh bilaterally. Diminished strength throughout: 2/5 hip flexion, 4/5 foot plantarflexion, 4/5 grip strength.   Labs and Imaging: CBC BMET  No results found for this basename: WBC, HGB, HCT, PLT,  in the last 168 hours No results found for this basename: NA, K, CL, CO2, BUN, CREATININE, GLUCOSE, CALCIUM,  in the last 168 hours  BMP Normal H&H Normal UPreg: Negative  MRI Brain w/contrast 9/27 (previous admission) COMPARISON: Brain MRI 02/01/2013.  FINDINGS:  Stable and normal cerebral volume. No  restricted diffusion to  suggest acute infarction. No midline shift, mass effect, evidence of  mass lesion, ventriculomegaly, extra-axial collection or acute  intracranial hemorrhage. Cervicomedullary junction and pituitary are  within normal limits. Grossly negative visualized cervical spine.  Major intracranial vascular flow voids are stable. Diminutive  posterior circulation again noted.  Fewer scattered periventricular white matter T2 and FLAIR  hyperintense lesions are visible today on axial FLAIR imaging.  Sagittal FLAIR images likewise show decreased white matter lesions.  Right periatrial white matter involvement now predominates. No new  lesions identified. No definite enhancing lesions today.  Deep gray matter nuclei, brainstem and cerebellum remain normal.  Grossly normal visualized internal auditory structures.  Normal bone marrow signal. Mastoids are clear. Paranasal sinuses are  clear. Orbits soft tissues within normal limits. Negative scalp soft  tissues.  IMPRESSION:  1. Interval regression of white matter lesions thought to reflect  demyelinating disease. No new or enhancing lesion identified today.  2. No new intracranial abnormality.  Meghan Junker, MD 03/19/2013, 12:53 PM PGY-1, [facility] Family Medicine FPTS Intern pager: [phone], text pages welcome  Family Medicine Upper Level Addendum:  I have seen and examined the patient independently, discussed with Dr. Jarvis Newcomer, fully reviewed the H+P and agree with it's contents with the additions as noted in blue text. My independent exam is below.     Tana Conch, MD, PGY-3 03/19/2013 2:22 PM

## 2013-03-19 NOTE — ED Notes (Addendum)
Pt from home, c/o bilateral leg numbness and pain in lower extremities with neck movement for 2 weeks.

## 2013-03-19 NOTE — H&P (Signed)
FMTS Attending Admission Note: Meghan Eniola,MD I  have seen and examined this patient, reviewed their chart. I have discussed this patient with the resident. I agree with the resident's findings, assessment and care plan.  Briefly a 27 y/o female newly diagnosed with MS about a month ago present to the hospital today with worsening of her symptoms,she c/o b/l lower limb weakness and numbness,occassional headache and intermittent vision change,currently her vision is normal and no headache.Her LL weakness makes her leg give out at times,last week she fell. She also mentioned since her diagnosis of MS she has worsening of her urine incontinence,she denies dysuria,no change in urine color or odor,no blood in her urine.  Filed Vitals:   03/19/13 1044 03/19/13 1054  BP: 130/74 138/92  Pulse: 75 68  Temp: 98.3 F (36.8 C) 98.2 F (36.8 C)  TempSrc: Oral Oral  Resp: 22   Weight:  220 lb (99.791 kg)  SpO2: 96% 100%   Exam: Gen: Awake and alert,not in distress. Neuro: Gait not assessed due to B/L LL weakness,no sensory loss to touch,power in both lower limb is about 2/5 in her LL,++ DTR upper and LL. HEENT: PERRLA,EOMI. Resp: Air entry equal b/l. CV: S1 S2,no murmur,RRR. Abd: Benign. Ext: No edema. LL weakness as noted.  A/P:  27 Y/O with Multiple sclerosis exacerbation.         MRI Cervical spine showed: Small actively enhancing lesion in the central cervical cord dorsal to the C5 vertebra. This was present in her MRI from Aug.         Neuro consult recommended.         High dose steroid recommended.         Monitor CBG.         On Copaxone,to continue med.         PT/OT recommended.         Fall precaution.

## 2013-03-20 DIAGNOSIS — E669 Obesity, unspecified: Secondary | ICD-10-CM

## 2013-03-20 DIAGNOSIS — R29898 Other symptoms and signs involving the musculoskeletal system: Secondary | ICD-10-CM

## 2013-03-20 DIAGNOSIS — G35 Multiple sclerosis: Principal | ICD-10-CM

## 2013-03-20 LAB — GLUCOSE, CAPILLARY
Glucose-Capillary: 155 mg/dL — ABNORMAL HIGH (ref 70–99)
Glucose-Capillary: 181 mg/dL — ABNORMAL HIGH (ref 70–99)

## 2013-03-20 LAB — SJOGRENS SYNDROME-B EXTRACTABLE NUCLEAR ANTIBODY: SSB (La) (ENA) Antibody, IgG: 11 AU/mL (ref <30)

## 2013-03-20 MED ORDER — OXYCODONE HCL 5 MG PO TABS: 5.0000 mg | ORAL_TABLET | ORAL | Status: DC | PRN

## 2013-03-20 MED ORDER — DSS 100 MG PO CAPS: 100.0000 mg | ORAL_CAPSULE | Freq: Two times a day (BID) | ORAL | Status: DC

## 2013-03-20 MED ORDER — PANTOPRAZOLE SODIUM 40 MG PO TBEC
40.0000 mg | DELAYED_RELEASE_TABLET | Freq: Every day | ORAL | Status: DC
Start: 2013-03-20 — End: 2013-03-20

## 2013-03-20 MED ORDER — OMEPRAZOLE 40 MG PO CPDR: 40.0000 mg | DELAYED_RELEASE_CAPSULE | Freq: Every day | ORAL | Status: DC

## 2013-03-20 MED ORDER — SODIUM CHLORIDE 0.9 % IV SOLN
500.0000 mg | Freq: Two times a day (BID) | INTRAVENOUS | Status: DC
  Administered 2013-03-20 (×2): 500 mg via INTRAVENOUS
  Filled 2013-03-20 (×2): qty 4

## 2013-03-20 MED ORDER — SODIUM CHLORIDE 0.9 % IV SOLN: INTRAVENOUS | Status: DC

## 2013-03-20 NOTE — Progress Notes (Signed)
03/20/2013 influenza vaccine given in left deltoid at 1209. Lot #: [id number] and expired 12/12/2013. [facility] RN.

## 2013-03-20 NOTE — Progress Notes (Signed)
Family Medicine Teaching Service Daily Progress Note Intern Pager: [phone]  Patient name: [name] Medical record number: [id number] Date of birth: 04-Feb-1986 Age: 27 y.o. Gender: female  Primary Care Provider: Gildardo Cranker, DO Consultants: Neurology Code Status: Full  Pt Overview and Major Events to Date:  10/5: Started 5 day course IV solumedrol  10/6: PT to evaluate  Assessment and Plan: [name] is a 27 y.o. female presenting with increasing weakness and pain in legs concerning for exacerbation of multiple sclerosis. Marland Kitchen PMH is significant for multiple sclerosis diagnosed in August 2014.   # Multiple sclerosis exacerbation with new-onset leg weakness and pain. - Neurology consultation appreciated  - MRI C-spine showing small actively enhancing lesion dorsal to C5 and T-spine showing no acute, but evidence of previous demyelination - Day 2/5 IV Solumedrol - CBG q8h for steroid-induced hyperglycemia  - Continue copaxone (family to bring this from home to pharmacy), gabapentin, OxyIR prn - PT/OT  - Pending evaluation and recommendations of PT/OT, may be candidate to receive HH administration of IV steroids at home.  - DDx:  SLE: ANA in process Sjogren's: SS-A, SS-B in process Infectious: U/A not collected  # FEN/GI: Regular diet, SLIV, PPI  # Prophylaxis: Heparin [place]   Disposition: Day 2/5 IV steroids: may remain admitted for duration or other pending PT rec's  Subjective: Pt with improved pain overnight on oxyIR vs. hydrocodone. Stable leg pain and weakness. Got up with assistance to bathroom.   Objective: Temp:  [98.1 F (36.7 C)-98.3 F (36.8 C)] 98.1 F (36.7 C) (10/06 0435) Pulse Rate:  [68-79] 70 (10/06 0435) Resp:  [17-22] 17 (10/06 0435) BP: (102-139)/(54-92) 102/54 mmHg (10/06 0435) SpO2:  [92 %-100 %] 97 % (10/06 0435) Weight:  [220 lb (99.791 kg)] 220 lb (99.791 kg) (10/05 1054) Physical Exam: General: WDWN 27 yo female in NAD Cardiovascular: RRR  II/VI early SEM at RSB, no edema, pulses 2+ Respiratory: CTAB nonlabored Abdomen: Soft, NT ND normal BS Neuro: AOx3, speech normal, no sensation below knees, diminished on thigh bilaterally. Diminished strength throughout:3/5 hip flexion, 4/5 foot plantarflexion, 4/5 grip strength.   Laboratory:  Recent Labs Lab 03/19/13 1300  WBC 5.5  HGB 12.1  HCT 35.9*  PLT 348    Recent Labs Lab 03/19/13 1300  NA 137  K 3.7  CL 101  CO2 27  BUN 12  CREATININE 0.82  CALCIUM 9.3  GLUCOSE 84    UPreg negative Imaging/Diagnostic Tests: MRI CERVICAL SPINE WITHOUT AND WITH CONTRAST  COMPARISON: Thoracic spine MRI today. Brain MRI 03/11/2013.  FINDINGS:  Straightening of the normal cervical lordosis. The patient is tilted  in the scanner. Flow voids are present in both vertebral arteries.  The paraspinal soft tissues are within normal limits. Cervical cord  demonstrates increased T2 signal dorsal to C5. There is a small  enhancing intra medullary cord lesion dorsal the C5, likely  representing active demyelinating disease. Small focus of  nonenhancing myelomalacia is present dorsal to the C4-C5 disc space,  likely representing a prior episode of demyelinating disease. The  bone marrow signal is within normal limits. There is no degenerative  change in the cervical spine. Posterior fossa structures appear  similar to the prior MRI.  IMPRESSION:  Small actively enhancing lesion in the central cervical cord dorsal  to the C5 vertebra compatible with an active plaque/transverse  myelitis in a patient with a history of multiple sclerosis. This  could be seen with multiple sclerosis, NMO, vasculitis,  neurosarcoidosis, or ADEM among other causes.  MRI THORACIC SPINE WITHOUT AND WITH CONTRAST  FINDINGS:  No evidence of an active demyelinating lesion in the thoracic spine.  There is no cord swelling or edema. Tiny areas of heterogeneous  signal are present dorsal to the T10 vertebra,  likely representing  old episodes of demyelinating disease. These are only well  appreciated on the sagittal imaging. There is no thoracic spine  degenerative disease. Bone marrow signal is normal. Spinal  segmentation is anatomic. Counting was performed from the  craniocervical junction.  After gadolinium administration, and no enhancing thoracic spine  lesions are identified.  IMPRESSION:  No evidence of active demyelinating disease in the thoracic spine.  Miniscule areas of myelomalacia dorsal the T10 are compatible with  old demyelinating lesions.   Hazeline Junker, MD 03/20/2013, 9:06 AM PGY-1, [facility] Family Medicine FPTS Intern pager: [phone], text pages welcome

## 2013-03-20 NOTE — Progress Notes (Signed)
FMTS Attending Note  I personally saw and evaluated the patient. The plan of care was discussed with the resident team. I agree with the assessment and plan as documented by the resident.   1. MS Exacerbation - no improvement yet with Solumedrol, management per Neurology, PT to work with patient  Donnella Sham MD

## 2013-03-20 NOTE — Progress Notes (Addendum)
NEURO HOSPITALIST PROGRESS NOTE   SUBJECTIVE:                                                                                                                         Felling slightly stronger today but still having pain in bilateral arms and legs with pain located in the neck she she looks down/left/right.  OBJECTIVE:                                                                                                                           Vital signs in last 24 hours: Temp:  [98.1 F (36.7 C)-98.2 F (36.8 C)] 98.1 F (36.7 C) (10/06 0435) Pulse Rate:  [70-79] 70 (10/06 0435) Resp:  [17-19] 17 (10/06 0435) BP: (102-139)/(54-71) 102/54 mmHg (10/06 0435) SpO2:  [92 %-97 %] 97 % (10/06 0435)  Intake/Output from previous day: 10/05 0701 - 10/06 0700 In: 1200 [P.O.:1200] Out: -  Intake/Output this shift: Total I/O In: 240 [P.O.:240] Out: -  Nutritional status: General  Past Medical History  Diagnosis Date  . Preterm labor   . Gonorrhea   . Abnormal Pap smear     Colpo>normal  . Urinary tract infection   . Ovarian cyst   . Neuromuscular disorder     diagnosed with MS this visit  . Multiple sclerosis   . Obesity      Neurologic Exam:   Mental Status: Alert, oriented, thought content appropriate.  Speech fluent without evidence of aphasia.  Able to follow 3 step commands without difficulty. Cranial Nerves: II: Discs flat bilaterally; Visual fields grossly normal, pupils equal, round, reactive to light and accommodation III,IV, VI: ptosis not present, extra-ocular motions intact bilaterally V,VII: smile symmetric, facial light touch sensation normal bilaterally VIII: hearing normal bilaterally IX,X: gag reflex present XI: bilateral shoulder shrug XII: midline tongue extension Motor: Bilateral 4/5 strength in shoulder abduction, bilateral elbow flexion and extension 4/5, bilateral hip flexion 2/5 with difficulty getting her leg off  the chair, knee flexion and extension 3/5 with 4/5 DF and PF bilaterally.  Sensory:  decreased sensation from knee to foot with preserved sensation in upper legs and abdomen.  Deep Tendon Reflexes:  Right: Upper Extremity   Left: Upper extremity  biceps (C-5 to C-6) 2/4   biceps (C-5 to C-6) 2/4 tricep (C7) 2/4    triceps (C7) 2/4 Brachioradialis (C6) 2/4  Brachioradialis (C6) 2/4  Lower Extremity Lower Extremity  quadriceps (L-2 to L-4) 2/4   quadriceps (L-2 to L-4) 2/4 Achilles (S1) 2/4   Achilles (S1) 2/4  Plantars: Right: downgoing   Left: downgoing Cerebellar: normal finger-to-nose bilaterally today,  Decreased strength in heel-to-shin test but no dysmetria noted on exam.  Gait: not examined CV: pulses palpable throughout   Lab Results: No results found for this basename: cbc, bmp, coags, chol, tri, ldl, hga1c   Lipid Panel No results found for this basename: CHOL, TRIG, HDL, CHOLHDL, VLDL, LDLCALC,  in the last 72 hours  Studies/Results: Mr Cervical Spine W Wo Contrast  03/19/2013   CLINICAL DATA:  Recently diagnosed with multiple sclerosis. New lower extremity weakness.  EXAM: MRI CERVICAL SPINE WITHOUT AND WITH CONTRAST  TECHNIQUE: Multiplanar and multiecho pulse sequences of the cervical spine, to include the craniocervical junction and cervicothoracic junction, were obtained according to standard protocol without and with intravenous contrast.  CONTRAST:  20mL MULTIHANCE GADOBENATE DIMEGLUMINE 529 MG/ML IV SOLN  COMPARISON:  Thoracic spine MRI today. Brain MRI 03/11/2013.  FINDINGS: Straightening of the normal cervical lordosis. The patient is tilted in the scanner. Flow voids are present in both vertebral arteries. The paraspinal soft tissues are within normal limits. Cervical cord demonstrates increased T2 signal dorsal to C5. There is a small enhancing intra medullary cord lesion dorsal the C5, likely representing active demyelinating disease. Small focus of nonenhancing  myelomalacia is present dorsal to the C4-C5 disc space, likely representing a prior episode of demyelinating disease. The bone marrow signal is within normal limits. There is no degenerative change in the cervical spine. Posterior fossa structures appear similar to the prior MRI.  IMPRESSION: Small actively enhancing lesion in the central cervical cord dorsal to the C5 vertebra compatible with an active plaque/transverse myelitis in a patient with a history of multiple sclerosis. This could be seen with multiple sclerosis, NMO, vasculitis, neurosarcoidosis, or ADEM among other causes.   Electronically Signed   By: Andreas Newport M.D.   On: 03/19/2013 17:29   Mr Thoracic Spine W Wo Contrast  03/19/2013   CLINICAL DATA:  New lower extremity weakness. Recently diagnosed with multiple sclerosis.  EXAM: MRI THORACIC SPINE WITHOUT AND WITH CONTRAST  TECHNIQUE: Multiplanar and multiecho pulse sequences of the thoracic spine were obtained without and with intravenous contrast.  CONTRAST:  20mL MULTIHANCE GADOBENATE DIMEGLUMINE 529 MG/ML IV SOLN  COMPARISON:  Cervical spine MRI today.  FINDINGS: No evidence of an active demyelinating lesion in the thoracic spine. There is no cord swelling or edema. Tiny areas of heterogeneous signal are present dorsal to the T10 vertebra, likely representing old episodes of demyelinating disease. These are only well appreciated on the sagittal imaging. There is no thoracic spine degenerative disease. Bone marrow signal is normal. Spinal segmentation is anatomic. Counting was performed from the craniocervical junction.  After gadolinium administration, and no enhancing thoracic spine lesions are identified.  IMPRESSION: No evidence of active demyelinating disease in the thoracic spine. Miniscule areas of myelomalacia dorsal the T10 are compatible with old demyelinating lesions.   Electronically Signed   By: Andreas Newport M.D.   On: 03/19/2013 17:34    MEDICATIONS  Scheduled: . docusate sodium  100 mg Oral BID  . gabapentin  400 mg Oral TID  . glatiramer  20 mg Subcutaneous Daily  . heparin  5,000 Units Subcutaneous Q8H  . methylPREDNISolone (SOLU-MEDROL) injection  500 mg Intravenous Q12H  . pantoprazole  40 mg Oral QHS  . sodium chloride  3 mL Intravenous Q12H    ASSESSMENT/PLAN:                                                                                                            Impression: 27 year old female with symptoms of lower surety weakness and numbness. MRI shows active lesion at the level of C5.   Recommend: 1) ANA, ssa, ssb --in process 2) IV Solu-Medrol  x5 days  --(while in hospital 500 mg BID)--(1 gram daily as out patient)--(today would be 2/5) --if glucose is well controlled may have this as out patient.  3) continue Copaxone  4) would ensure frequent blood glucose checks at least early on on high-dose steroids  5) GI prophylaxis while on steroids  6) PT, OT while in hospital and as out patient.  7) Patient has a appointment with [facility] neurology in two weeks--she should keep this appointment.      Assessment and plan discussed with with attending physician and they are in agreement.    Felicie Morn PA-C [facility] [phone]  03/20/2013, 1:31 PM

## 2013-03-20 NOTE — Care Management Note (Signed)
   CARE MANAGEMENT NOTE 03/20/2013  Patient:  [name], [name]   Account Number:  [id number]  Date Initiated:  03/20/2013  Documentation initiated by:  Jossalin Chervenak  Subjective/Objective Assessment:   Pt for IV Solu-Medrol x 3 days at home and for outpatient PT after that 3 days.     Action/Plan:   Met with pt and setup with [facility] for IV solu-medrol x 3days to begin 03/21/2013.  Will fax prescription for outpatient PT to [facility] at [facility], tomorrow, pt informed.   Anticipated DC Date:  03/20/2013   Anticipated DC Plan:  HOME W HOME HEALTH SERVICES         Choice offered to / List presented to:          [facility] arranged  HH-1 RN      [facility] agency  [facility]   Status of service:  Completed, signed off Medicare Important Message given?   (If response is "NO", the following Medicare IM given date fields will be blank) Date Medicare IM given:   Date Additional Medicare IM given:    Discharge Disposition:  HOME W HOME HEALTH SERVICES  Per UR Regulation:    If discussed at Long Length of Stay Meetings, dates discussed:    Comments:

## 2013-03-20 NOTE — Evaluation (Signed)
Physical Therapy Evaluation Patient Details Name: [name] MRN: [id number] DOB: 04/06/1986 Today's Date: 03/20/2013 Time: 1040-1057 PT Time Calculation (min): 17 min  PT Assessment / Plan / Recommendation History of Present Illness  Pt is 27 y/o female admitted w/ dx MS exacerbation & generalized weakness.  Clinical Impression  Pt presents with deficits secondary to MS exacerbation and will benefit from skilled PT to address problems listed below. Pt reports she will have 24/7 (A) by mother and fiance upon acute D/C. Pt reports she will not be homebound for HHPT. Pt to benefit from OPPT upon acute D/C to manage MS symptoms.     PT Assessment  Patient needs continued PT services    Follow Up Recommendations  Outpatient PT;Supervision/Assistance - 24 hour;Other (comment) (pt reports she will not be homebound for HHPT)    Does the patient have the potential to tolerate intense rehabilitation      Barriers to Discharge        Equipment Recommendations  3in1 (PT)    Recommendations for Other Services     Frequency Min 3X/week    Precautions / Restrictions Precautions Precautions: Fall Restrictions Weight Bearing Restrictions: No   Pertinent Vitals/Pain No c/o pain; reports numbness in bil LE from knee and below       Mobility  Bed Mobility Bed Mobility: Not assessed Supine to Sit: 5: Supervision;HOB elevated Sitting - Scoot to Edge of Bed: 5: Supervision;With rail Details for Bed Mobility Assistance: pt sitting in recliner and returned to recliner  Transfers Transfers: Sit to Stand;Stand to Sit Sit to Stand: With upper extremity assist;From chair/3-in-1;4: Min guard;With armrests Stand to Sit: 4: Min guard;To chair/3-in-1;With armrests;With upper extremity assist Details for Transfer Assistance: cues for hand placement and safety; min guard to steady and for cues  Ambulation/Gait Ambulation/Gait Assistance: 4: Min guard Ambulation Distance (Feet): 12  Feet Assistive device: Rolling walker Ambulation/Gait Assistance Details: pt fatigues quickly; relies heavily on UEs; pt with bil hip hike and decreased ability to flex knees for swing phase; shuffled gt  Gait Pattern: Right hip hike;Left hip hike Gait velocity: decreased  Stairs: No Wheelchair Mobility Wheelchair Mobility: No    Exercises     PT Diagnosis: Abnormality of gait;Generalized weakness  PT Problem List: Decreased strength;Decreased range of motion;Decreased activity tolerance;Decreased mobility;Decreased balance;Decreased knowledge of use of DME PT Treatment Interventions: DME instruction;Gait training;Functional mobility training;Therapeutic activities;Neuromuscular re-education;Balance training;Therapeutic exercise;Patient/family education     PT Goals(Current goals can be found in the care plan section) Acute Rehab PT Goals Patient Stated Goal: To return home (PRN assist from mother during day & fiancee at noc/evening) PT Goal Formulation: With patient Time For Goal Achievement: 03/27/13 Potential to Achieve Goals: Good  Visit Information  Last PT Received On: 03/20/13 Assistance Needed: +1 History of Present Illness: Pt is 27 y/o female admitted w/ dx MS exacerbation & generalized weakness.       Prior Functioning  Home Living Family/patient expects to be discharged to:: Private residence Living Arrangements: Children;Other relatives;Other (Comment) Available Help at Discharge: Family;Available PRN/intermittently Type of Home: House Home Access: Stairs to enter Entrance Stairs-Number of Steps: 12 STE or pt reports she can enter from back of house - level entrance, must use RW on lawn for this option. 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 Pt reports that she is able to stay at her  mothers  house during day while finacee is at work. She states that her mothers house is all one level. Communication Communication: No difficulties Dominant Hand: Right    Cognition  Cognition Arousal/Alertness: Awake/alert Behavior During Therapy: WFL for tasks assessed/performed Overall Cognitive Status: Within Functional Limits for tasks assessed    Extremity/Trunk Assessment Upper Extremity Assessment Upper Extremity Assessment: Defer to OT evaluation Lower Extremity Assessment Lower Extremity Assessment: Generalized weakness; bil LE numbness and tingling below knees  Cervical / Trunk Assessment Cervical / Trunk Assessment: Normal   Balance Balance Balance Assessed: Yes Static Sitting Balance Static Sitting - Balance Support: Right upper extremity supported;Feet supported Static Standing Balance Static Standing - Balance Support: Bilateral upper extremity supported;During functional activity Static Standing - Level of Assistance: Other (comment) (min guard )  End of Session PT - End of Session Equipment Utilized During Treatment: Gait belt Activity Tolerance: Patient limited by fatigue Patient left: in chair;with call bell/phone within reach Nurse Communication: Mobility status  GP     Donell Sievert, [place] [phone] 03/20/2013, 12:46 PM

## 2013-03-20 NOTE — Evaluation (Signed)
Occupational Therapy Evaluation Patient Details Name: TERISSA HAFFEY MRN: [id number] DOB: 14-Mar-1986 Today's Date: 03/20/2013 Time: 6213-0865 OT Time Calculation (min): 29 min  OT Assessment / Plan / Recommendation History of present illness Pt is 27 y/o female admitted w/ dx MS exacerbation & generalized weakness.   Clinical Impression   Pt presents w/ deficits secondary to recent MS exacerbation and will benefit from acute OT to assist with maximizing independence w/ ADL's & functional transfers in preparation for d/c home w/ family PRN assistance.    OT Assessment  Patient needs continued OT Services    Follow Up Recommendations  No OT follow up    Barriers to Discharge      Equipment Recommendations  3 in 1 bedside comode    Recommendations for Other Services    Frequency  Min 2X/week    Precautions / Restrictions Precautions Precautions: Fall Restrictions Weight Bearing Restrictions: No   Pertinent Vitals/Pain Pt reports bilateral LE pain but did not rate. Repositioned and increased activity.    ADL  Eating/Feeding: Performed;Independent Where Assessed - Eating/Feeding: Chair Grooming: Performed;Wash/dry hands;Modified independent Where Assessed - Grooming: Supported sitting Upper Body Bathing: Simulated;Set up;Modified independent Where Assessed - Upper Body Bathing: Supported sitting Lower Body Bathing: Simulated;Minimal assistance Where Assessed - Lower Body Bathing: Supported sitting;Supported sit to stand Upper Body Dressing: Simulated;Modified independent;Set up Where Assessed - Upper Body Dressing: Supported sitting;Unsupported sitting Lower Body Dressing: Performed;Minimal assistance Where Assessed - Lower Body Dressing: Supported sit to stand Toilet Transfer: Research scientist (life sciences) Method: Sit to Barista: Comfort height toilet;Grab bars Toileting - Architect and Hygiene:  Simulated;Supervision/safety Where Assessed - Engineer, mining and Hygiene: Sit to stand from 3-in-1 or toilet Tub/Shower Transfer Method: Not assessed Equipment Used: Gait belt;Rolling walker Transfers/Ambulation Related to ADLs: Pt is overall supervision functional mobility and transfers, she does not bend her knees when ambulating (instead hikes her hips, w/ knees in extension) stating that "It hurts if I do, so I do it this way"  ADL Comments: Pt participated in therapeutic activity involving ADL transfer & functional mobility in room, bathroom and LB dressing activity seated at EOB. Discussed Role of OT w/ pt & she states that she plans to d/c home w/ PRN assist from her fiancee at night & will otherwise go to her mothers house during the day as needed. Note pt has 4 children ages 45, 15 & 2 y/o twins.    OT Diagnosis: Generalized weakness;Acute pain  OT Problem List: Decreased strength;Decreased activity tolerance;Impaired balance (sitting and/or standing);Decreased knowledge of use of DME or AE;Pain OT Treatment Interventions: Self-care/ADL training;Energy conservation;DME and/or AE instruction;Patient/family education;Therapeutic activities;Balance training   OT Goals(Current goals can be found in the care plan section) Acute Rehab OT Goals Patient Stated Goal: To return home (PRN assist from mother during day & fiancee at noc/evening) Time For Goal Achievement: 04/03/13 Potential to Achieve Goals: Good  Visit Information  Last OT Received On: 03/20/13 Assistance Needed: +1 History of Present Illness: Pt is 27 y/o female admitted w/ dx MS exacerbation & generalized weakness.       Prior Functioning     Home Living Family/patient expects to be discharged to:: Private residence Living Arrangements: Children;Other relatives;Other (Comment) Available Help at Discharge: Family;Available PRN/intermittently Type of Home: House Home Access: Stairs to enter Entrance  Stairs-Number of Steps: 12 STE or pt reports she can enter from back of house - level entrance, must use RW on lawn for this option. 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 She states that her mothers house is all one level. Communication Communication: No difficulties Dominant Hand: Right    Vision/Perception Vision - History Patient Visual Report: No change from baseline   Cognition  Cognition Arousal/Alertness: Awake/alert Behavior During Therapy: WFL for tasks assessed/performed Overall Cognitive Status: Within Functional Limits for tasks assessed    Extremity/Trunk Assessment Upper Extremity Assessment Upper Extremity Assessment: Generalized weakness Lower Extremity Assessment Lower Extremity Assessment: Defer to PT evaluation Cervical / Trunk Assessment Cervical / Trunk Assessment: Normal    Mobility Bed Mobility Bed Mobility: Supine to Sit;Sitting - Scoot to Edge of Bed Supine to Sit: 5: Supervision;HOB elevated Sitting - Scoot to Edge of Bed: 5: Supervision;With rail Transfers Transfers: Sit to Stand;Stand to Sit Sit to Stand: With upper extremity assist;From bed;From chair/3-in-1;4: Min guard;With armrests Stand to Sit: 4: Min guard;With upper extremity assist;With armrests;To chair/3-in-1 Details for Transfer Assistance: VC's for hand placement and safety w/ RW        Balance Balance Balance Assessed: Yes Static Sitting Balance Static Sitting - Balance Support: Right upper extremity supported;Feet supported Static Standing Balance Static Standing - Balance Support: Right upper extremity supported   End of Session OT - End of Session Equipment Utilized During Treatment: Gait belt;Rolling walker Activity Tolerance: Patient  tolerated treatment well Patient left: in chair;with call bell/phone within reach Nurse Communication: Mobility status  GO     Roselie Awkward Dixon 03/20/2013, 12:26 PM

## 2013-03-21 NOTE — Care Management Note (Signed)
Late Entry: 03/21/2013 Prescription and pt information ( demographics, H&P, and PT notes) faxed to [facility] at [facility]. Spoke with rep, Daisy yesterday and faxed info to her attention. Pt aware prior to d/c that info would be faxed today when prescription available. She anticipates a call from the rehab facility to schedule an appointment following  3 days of IV Solu-Medrol. Johny Shock RN MPH , [phone]

## 2013-03-21 NOTE — Discharge Summary (Signed)
Family Medicine [facility] Discharge Summary  Patient name: [name] Medical record number: [id number] Date of birth: 1986-02-08 Age: 27 y.o. Gender: female Date of Admission: 03/19/2013  Date of Discharge: 03/20/2013 Admitting Physician: Janit Pagan, MD  Primary Care Provider: Gildardo Cranker, DO Consultants: Neurology, Dr. Amada Jupiter  Indication for Hospitalization: Multiple sclerosis exacerbation  Discharge Diagnoses/Problem List:  Multiple sclerosis exacerbation Obesity  Disposition: To home  Discharge Condition: Stable  Discharge Exam:  Blood pressure 128/98, pulse 81, temperature 98.4 F (36.9 C), temperature source Oral, resp. rate 18, height 5\' 4"  (1.626 m), weight 220 lb (99.791 kg), last menstrual period 03/13/2013, SpO2 98.00%.  General: WDWN 27 yo female in NAD  Cardiovascular: RRR II/VI early SEM at RSB, no edema, pulses 2+  Respiratory: CTAB nonlabored  Abdomen: Soft, NT ND normal BS  Neuro: AOx3, DTRs 2+ speech normal, no sensation below knees, diminished on thigh bilaterally. Should abduction: 4/5. Diminished strength LE:2/5 hip flexion, 4/5 foot plantarflexion, 4/5 grip strength.   Brief Hospital Course:  ADRINNE [name] is a 27 y.o. female presenting on 10/5 with increasing weakness and pain in legs concerning for exacerbation of multiple sclerosis. PMH is significant for multiple sclerosis diagnosed 02/01/2013. She was admitted and started on a 5 day course of high dose IV steroids, per neurology consultant. MRI, full read below, showed small actively enhancing lesion at the level of C5 and myelomalacia indicating previous demyelination in the thoracic cord.  Gabapentin was continued, as was copaxone (which she had taken for only 6 days prior to admission). OxyIR 5mg  was used to control acute leg pain. She required a walker to ambulate with assistance. Case management organized outpatient PT on discharge, per PT recommendation. She was discharged  on 10/6 with IV in place and home health RN to administer solumedrol 1000mg  daily for the 3 days after discharge. Follow up is arranged with PCP as well as [facility] neurology.   Issues for Follow Up:  Assessment of extent of neurological impairment following exacerbation.   Significant Procedures: None  Significant Labs and Imaging:   Recent Labs Lab 03/19/13 1300  WBC 5.5  HGB 12.1  HCT 35.9*  PLT 348    Recent Labs Lab 03/19/13 1300  NA 137  K 3.7  CL 101  CO2 27  GLUCOSE 84  BUN 12  CREATININE 0.82  CALCIUM 9.3   ANA NEGATIVE Anti-SSA, SSB: wnl UPreg negative  Imaging/Diagnostic Tests:  MRI CERVICAL SPINE WITHOUT AND WITH CONTRAST  COMPARISON: Thoracic spine MRI today. Brain MRI 03/11/2013.  FINDINGS:  Straightening of the normal cervical lordosis. The patient is tilted  in the scanner. Flow voids are present in both vertebral arteries.  The paraspinal soft tissues are within normal limits. Cervical cord  demonstrates increased T2 signal dorsal to C5. There is a small  enhancing intra medullary cord lesion dorsal the C5, likely  representing active demyelinating disease. Small focus of  nonenhancing myelomalacia is present dorsal to the C4-C5 disc space,  likely representing a prior episode of demyelinating disease. The  bone marrow signal is within normal limits. There is no degenerative  change in the cervical spine. Posterior fossa structures appear  similar to the prior MRI.  IMPRESSION:  Small actively enhancing lesion in the central cervical cord dorsal  to the C5 vertebra compatible with an active plaque/transverse  myelitis in a patient with a history of multiple sclerosis. This  could be seen with multiple sclerosis, NMO, vasculitis,  neurosarcoidosis, or ADEM  among other causes.  MRI THORACIC SPINE WITHOUT AND WITH CONTRAST  FINDINGS:  No evidence of an active demyelinating lesion in the thoracic spine.  There is no cord swelling or edema. Tiny  areas of heterogeneous  signal are present dorsal to the T10 vertebra, likely representing  old episodes of demyelinating disease. These are only well  appreciated on the sagittal imaging. There is no thoracic spine  degenerative disease. Bone marrow signal is normal. Spinal  segmentation is anatomic. Counting was performed from the  craniocervical junction.  After gadolinium administration, and no enhancing thoracic spine  lesions are identified.  IMPRESSION:  No evidence of active demyelinating disease in the thoracic spine.  Miniscule areas of myelomalacia dorsal the T10 are compatible with  old demyelinating lesions.  Results/Tests Pending at Time of Discharge: None  Discharge Medications:    Medication List    STOP taking these medications       HYDROcodone-acetaminophen 5-325 MG per tablet  Commonly known as:  NORCO/VICODIN      TAKE these medications       DSS 100 MG Caps  Take 100 mg by mouth 2 (two) times daily.     gabapentin 400 MG capsule  Commonly known as:  NEURONTIN  Take 1 capsule (400 mg total) by mouth 3 (three) times daily.     glatiramer 20 MG/ML injection  Commonly known as:  COPAXONE  Inject 20 mg into the skin daily.     omeprazole 40 MG capsule  Commonly known as:  PRILOSEC  Take 1 capsule (40 mg total) by mouth daily. Take at dinnertime.     oxyCODONE 5 MG immediate release tablet  Commonly known as:  ROXICODONE  Take 1 tablet (5 mg total) by mouth every 4 (four) hours as needed for pain.     sodium chloride 0.9 % SOLN 50 mL with methylPREDNISolone sodium succinate 1000 MG SOLR  Administer 500mg  tonight 10/6, then 1000mg  daily 10/7-10/9, then 500mg  10/10 AM, then done.        Discharge Instructions: Please refer to Patient Instructions section of EMR for full details.  Patient was counseled important signs and symptoms that should prompt return to medical care, changes in medications, dietary instructions, activity restrictions, and follow  up appointments.   Follow-Up Appointments: Follow-up Information   Follow up with Gildardo Cranker, DO On 03/27/2013. (at 9:30AM for hospital follow-up - please come in at 9:15)    Specialty:  Family Medicine   Contact information:   [address] [place] [address] [phone]      Appointment with [phone] neurology 2 weeks after discharge.   Hazeline Junker, MD 03/21/2013, 1:32 PM PGY-1, [facility]

## 2013-03-22 NOTE — Discharge Summary (Signed)
I agree with the discharge summary as documented.   Julyan Gales MD  

## 2013-03-27 ENCOUNTER — Encounter: Payer: Self-pay | Admitting: Family Medicine

## 2013-03-27 ENCOUNTER — Ambulatory Visit (INDEPENDENT_AMBULATORY_CARE_PROVIDER_SITE_OTHER): Payer: Medicaid Other | Admitting: Family Medicine

## 2013-03-27 VITALS — BP 116/64 | HR 83 | Temp 98.9°F | Ht 64.0 in

## 2013-03-27 DIAGNOSIS — G35 Multiple sclerosis: Secondary | ICD-10-CM

## 2013-03-27 NOTE — Progress Notes (Signed)
[name] is a 27 y.o. female who presents today for hospital f/u for MS exacerbation.  Pt was seen in the ED and hospitalized on 10/5 for fall secondary to lower extremity weakness secondary to her MS.  Recently  was placed on methylprednisone for exacerbation which she completed on 10/10.  She is still feeling weak in her legs and felt like the medication did not help too much.   Not c/o diplopia, blurred vision, change in color vision but continues to have bowel/bladder incontinence.  She is scheduled to see [facility] neurology on 04/04/13.   Past Medical History  Diagnosis Date  . Preterm labor   . Gonorrhea   . Abnormal Pap smear     Colpo>normal  . Urinary tract infection   . Ovarian cyst   . Neuromuscular disorder     diagnosed with MS this visit  . Multiple sclerosis   . Obesity     History  Smoking status  . Never Smoker   Smokeless tobacco  . Never Used    Family History  Problem Relation Age of Onset  . Anesthesia problems Neg Hx   . Other Neg Hx   . Diabetes Mother   . Hypertension Mother   . Diabetes Father   . Hypertension Father     Current Outpatient Prescriptions on File Prior to Visit  Medication Sig Dispense Refill  . docusate sodium 100 MG CAPS Take 100 mg by mouth 2 (two) times daily.  10 capsule  0  . gabapentin (NEURONTIN) 400 MG capsule Take 1 capsule (400 mg total) by mouth 3 (three) times daily.  90 capsule  1  . glatiramer (COPAXONE) 20 MG/ML injection Inject 20 mg into the skin daily.      Marland Kitchen omeprazole (PRILOSEC) 40 MG capsule Take 1 capsule (40 mg total) by mouth daily. Take at dinnertime.  30 capsule  0  . oxyCODONE (ROXICODONE) 5 MG immediate release tablet Take 1 tablet (5 mg total) by mouth every 4 (four) hours as needed for pain.  30 tablet  0  . sodium chloride 0.9 % SOLN 50 mL with methylPREDNISolone sodium succinate 1000 MG SOLR Administer 500mg  tonight 10/6, then 1000mg  daily 10/7-10/9, then 500mg  10/10 AM, then done.  4000 mg  0   No  current facility-administered medications on file prior to visit.    ROS: Per HPI.  All other systems reviewed and are negative.   Physical Exam Filed Vitals:   03/27/13 0954  BP: 116/64  Pulse: 83  Temp: 98.9 F (37.2 C)   Physical Examination: General appearance - alert, well appearing, and in no distress  Mental status - alert, oriented to person, place, and time  Neck - supple, no significant adenopathy  Heart - normal rate and regular rhythm, no murmurs noted  Neurological - Upper extremity MS 5/5 B/L, CN 2-12 intact, no internuclear ophthalmoplegia, 3/5 MS B/L LE, +3/4 DTR Patellar reflex B/L LE  Musculoskeletal - full range of motion without pain  ANA - negative SSA A/B - negative

## 2013-03-27 NOTE — Patient Instructions (Signed)
[name], it was nice seeing you today.  We will see you back in one month, after you see the neurologist at [facility].  If you need anything in the meantime, please don't hesitate to call.  Thanks, Dr. Paulina Fusi

## 2013-03-27 NOTE — Assessment & Plan Note (Signed)
Pt unfortunately with classic S/Sx of MS along with the imaging and CSF features of the disease.  Recently hospitalized for this, was placed on methylprednisone for exacerbation which she completed on 10/10.  She is still feeling weak in her legs and felt like the medication did not help too much.   She is scheduled to see [facility] neurology on 04/04/13.  As well, will f/u in 4 weeks to see how she is doing and will discuss the case with our social work, Theresia Bough, who greatly appreciate her help.

## 2013-03-30 ENCOUNTER — Ambulatory Visit: Payer: Medicaid Other | Attending: Family Medicine | Admitting: Physical Therapy

## 2013-03-30 DIAGNOSIS — R209 Unspecified disturbances of skin sensation: Secondary | ICD-10-CM | POA: Insufficient documentation

## 2013-03-30 DIAGNOSIS — R5381 Other malaise: Secondary | ICD-10-CM | POA: Insufficient documentation

## 2013-03-30 DIAGNOSIS — M6281 Muscle weakness (generalized): Secondary | ICD-10-CM | POA: Insufficient documentation

## 2013-03-30 DIAGNOSIS — G35 Multiple sclerosis: Secondary | ICD-10-CM | POA: Insufficient documentation

## 2013-03-30 DIAGNOSIS — R269 Unspecified abnormalities of gait and mobility: Secondary | ICD-10-CM | POA: Insufficient documentation

## 2013-04-06 ENCOUNTER — Telehealth: Payer: Self-pay | Admitting: Family Medicine

## 2013-04-06 NOTE — Telephone Encounter (Signed)
NPI # given for [facility] Neurology

## 2013-05-02 ENCOUNTER — Encounter: Payer: Self-pay | Admitting: Family Medicine

## 2013-05-02 ENCOUNTER — Ambulatory Visit (INDEPENDENT_AMBULATORY_CARE_PROVIDER_SITE_OTHER): Payer: Medicaid Other | Admitting: Family Medicine

## 2013-05-02 DIAGNOSIS — D510 Vitamin B12 deficiency anemia due to intrinsic factor deficiency: Secondary | ICD-10-CM

## 2013-05-02 DIAGNOSIS — G35 Multiple sclerosis: Secondary | ICD-10-CM

## 2013-05-02 HISTORY — DX: Vitamin B12 deficiency anemia due to intrinsic factor deficiency: D51.0

## 2013-05-02 MED ORDER — CYANOCOBALAMIN 1000 MCG/ML IJ SOLN: 1000.0000 ug | Freq: Once | INTRAMUSCULAR | Status: DC

## 2013-05-02 MED ORDER — OXYCODONE-ACETAMINOPHEN 10-325 MG PO TABS: 1.0000 | ORAL_TABLET | ORAL | Status: DC | PRN

## 2013-05-02 NOTE — Progress Notes (Signed)
[name] is a 27 y.o. female who presents today for hospital f/u for MS exacerbation and B12 deficiency.  B12 Deficiency - Pt with dx pernicious anemia at [facility] from her most recent hospitalization.  Told she needs B12 replacement IM and per primary.  Does have some paresthesias down her legs unsure if related to her MS or this.  Has appointment with GI at [facility] in early January.    MS exacerbation - Seen and d/c from [facility] about one week ago for MS exacerbation with fall.  She will be tried on new medication PO called Tecfidera and will be following with neurology next week to see how she is doing on this medication.  She denies any current weakness, worsening fatigue, blurred vision or color changes.    Past Medical History  Diagnosis Date  . Preterm labor   . Gonorrhea   . Abnormal Pap smear     Colpo>normal  . Urinary tract infection   . Ovarian cyst   . Neuromuscular disorder     diagnosed with MS this visit  . Multiple sclerosis   . Obesity     History  Smoking status  . Never Smoker   Smokeless tobacco  . Never Used    Family History  Problem Relation Age of Onset  . Anesthesia problems Neg Hx   . Other Neg Hx   . Diabetes Mother   . Hypertension Mother   . Diabetes Father   . Hypertension Father     Current Outpatient Prescriptions on File Prior to Visit  Medication Sig Dispense Refill  . docusate sodium 100 MG CAPS Take 100 mg by mouth 2 (two) times daily.  10 capsule  0  . gabapentin (NEURONTIN) 400 MG capsule Take 1 capsule (400 mg total) by mouth 3 (three) times daily.  90 capsule  1  . glatiramer (COPAXONE) 20 MG/ML injection Inject 20 mg into the skin daily.      Marland Kitchen omeprazole (PRILOSEC) 40 MG capsule Take 1 capsule (40 mg total) by mouth daily. Take at dinnertime.  30 capsule  0  . oxyCODONE (ROXICODONE) 5 MG immediate release tablet Take 1 tablet (5 mg total) by mouth every 4 (four) hours as needed for pain.  30 tablet  0   No current  facility-administered medications on file prior to visit.    ROS: Per HPI.  All other systems reviewed and are negative.   Physical Exam There were no vitals filed for this visit.  Physical Examination: General appearance - alert, well appearing, and in no distress  Mental status - alert, oriented to person, place, and time  Neck - supple, no significant adenopathy  Heart - normal rate and regular rhythm, no murmurs noted  Neurological - no internuclear ophthalmoplegia,  +3/4 DTR Patellar reflex B/L LE  Musculoskeletal - full range of motion without pain

## 2013-05-02 NOTE — Assessment & Plan Note (Signed)
Pt with recent hospitalization at [facility] for her MS with fall.  She will be trying a new medication called Tecfidera, PO qd, and will be following up with neurology at [facility].  Will continue to follow along.

## 2013-05-02 NOTE — Assessment & Plan Note (Addendum)
Per [facility] d/c summary.  Will Rx B12 shots and will see GI in January at [facility].  She would like to give herself the shots, will recheck in one week after doing one mg per day, and if needs adjustment would do 1 mg every other day for one week and recheck, adjust accordingly.  Supplies given for injections and recheck B12 at next visit in approx 1 week.

## 2013-05-02 NOTE — Patient Instructions (Signed)
[name], it was nice seeing you as usual.  We will see you back in one week and check your B12 at that time.  If you have any problems, please call us.  Thanks, Dr. Paulina Fusi

## 2013-05-04 ENCOUNTER — Ambulatory Visit: Payer: Medicaid Other | Attending: Family Medicine | Admitting: Physical Therapy

## 2013-05-04 DIAGNOSIS — M6281 Muscle weakness (generalized): Secondary | ICD-10-CM | POA: Insufficient documentation

## 2013-05-04 DIAGNOSIS — R5381 Other malaise: Secondary | ICD-10-CM | POA: Insufficient documentation

## 2013-05-04 DIAGNOSIS — R209 Unspecified disturbances of skin sensation: Secondary | ICD-10-CM | POA: Insufficient documentation

## 2013-05-04 DIAGNOSIS — R269 Unspecified abnormalities of gait and mobility: Secondary | ICD-10-CM | POA: Insufficient documentation

## 2013-05-04 DIAGNOSIS — G35 Multiple sclerosis: Secondary | ICD-10-CM | POA: Insufficient documentation

## 2013-05-08 ENCOUNTER — Ambulatory Visit: Payer: Medicaid Other | Admitting: Physical Therapy

## 2013-05-09 ENCOUNTER — Other Ambulatory Visit: Payer: Self-pay | Admitting: Family Medicine

## 2013-05-09 ENCOUNTER — Ambulatory Visit (INDEPENDENT_AMBULATORY_CARE_PROVIDER_SITE_OTHER): Payer: Medicaid Other | Admitting: Family Medicine

## 2013-05-09 ENCOUNTER — Telehealth: Payer: Self-pay | Admitting: Family Medicine

## 2013-05-09 ENCOUNTER — Encounter: Payer: Self-pay | Admitting: Family Medicine

## 2013-05-09 VITALS — BP 128/59 | HR 74 | Temp 98.1°F | Ht 64.0 in | Wt 229.4 lb

## 2013-05-09 DIAGNOSIS — D510 Vitamin B12 deficiency anemia due to intrinsic factor deficiency: Secondary | ICD-10-CM

## 2013-05-09 DIAGNOSIS — F4323 Adjustment disorder with mixed anxiety and depressed mood: Secondary | ICD-10-CM

## 2013-05-09 MED ORDER — NEEDLES & SYRINGES MISC: Status: DC

## 2013-05-09 MED ORDER — IBUPROFEN 800 MG PO TABS: 800.0000 mg | ORAL_TABLET | Freq: Three times a day (TID) | ORAL | Status: DC | PRN

## 2013-05-09 NOTE — Patient Instructions (Signed)
Ajah, it was nice seeing you today.  Please continue with the B12 injections, once per day, until we see you again.  We will do some lab work on you today to further investigate this as well.  Please call if you need anything.  We'll see you back in 3-4 weeks as time is best for you.   Thanks, Dr. Paulina Fusi

## 2013-05-09 NOTE — Progress Notes (Signed)
[name] is a 27 y.o. female who presents today for B12 deficiency.   B12 Deficiency - Pt with dx pernicious anemia at [facility] from her most recent hospitalization. She has done B12 injections, 1 mg qd for the past week with no improvement in her Sx.  She is still having paresthesias, numbness, and ataxia of her lower extremities.  Appointment with GI at [facility] in early January.     Past Medical History  Diagnosis Date  . Preterm labor   . Gonorrhea   . Abnormal Pap smear     Colpo>normal  . Urinary tract infection   . Ovarian cyst   . Neuromuscular disorder     diagnosed with MS this visit  . Multiple sclerosis   . Obesity   . Pernicious anemia 05/02/2013    History  Smoking status  . Never Smoker   Smokeless tobacco  . Never Used    Family History  Problem Relation Age of Onset  . Anesthesia problems Neg Hx   . Other Neg Hx   . Diabetes Mother   . Hypertension Mother   . Diabetes Father   . Hypertension Father     Current Outpatient Prescriptions on File Prior to Visit  Medication Sig Dispense Refill  . cyanocobalamin (,VITAMIN B-12,) 1000 MCG/ML injection Inject 1 mL (1,000 mcg total) into the muscle once.  30 mL  1  . docusate sodium 100 MG CAPS Take 100 mg by mouth 2 (two) times daily.  10 capsule  0  . gabapentin (NEURONTIN) 400 MG capsule Take 1 capsule (400 mg total) by mouth 3 (three) times daily.  90 capsule  1  . glatiramer (COPAXONE) 20 MG/ML injection Inject 20 mg into the skin daily.      Marland Kitchen omeprazole (PRILOSEC) 40 MG capsule Take 1 capsule (40 mg total) by mouth daily. Take at dinnertime.  30 capsule  0  . oxyCODONE-acetaminophen (PERCOCET) 10-325 MG per tablet Take 1 tablet by mouth every 4 (four) hours as needed for pain.  90 tablet  0   No current facility-administered medications on file prior to visit.    ROS: Per HPI.  All other systems reviewed and are negative.   Physical Exam Filed Vitals:   05/09/13 1507  BP: 128/59  Pulse: 74  Temp:  98.1 F (36.7 C)    Physical Examination: General appearance - alert, well appearing, and in no distress Neurological - no internuclear ophthalmoplegia, +3/4 DTR Patellar reflex B/L LE

## 2013-05-09 NOTE — Assessment & Plan Note (Signed)
Pt has been performing the B12 injections, 1 mg qd, over the past week and compliant with the medication.  Will check B12, Folate, MMA, and Homocysteine today and see her back in 2-3 weeks to see how she is doing.  Discussed with the pharmacist from [facility] who will cover the B12 1 mg for 30 days along with B12 syringes/needles.

## 2013-05-09 NOTE — Telephone Encounter (Signed)
Her depression screening score is 14. She ranked for moderate depression. She is newly diagnosed for MS-3 months ago. She has history of ADD. She is restless and fidgity

## 2013-05-09 NOTE — Assessment & Plan Note (Signed)
Pt continues to have anxiety, secondary to her recent dx of MS.  She denies any current SI/HI but interested in psychologist to discuss her life situations.  Advised of places that accept medicaid, will call back if needs referral.

## 2013-05-10 LAB — VITAMIN B12: Vitamin B-12: >2000 pg/mL — ABNORMAL HIGH (ref 211–911)

## 2013-05-10 LAB — FOLATE: Folate: 12.2 ng/mL

## 2013-05-10 LAB — HOMOCYSTEINE: Homocysteine: 6.2 umol/L (ref 4.0–15.4)

## 2013-05-12 LAB — METHYLMALONIC ACID, SERUM: Methylmalonic Acid, Quant: <0.09 umol/L (ref <0.40)

## 2013-05-15 ENCOUNTER — Ambulatory Visit: Payer: Medicaid Other | Admitting: Physical Therapy

## 2013-05-16 ENCOUNTER — Ambulatory Visit: Payer: Medicaid Other | Attending: Family Medicine | Admitting: Physical Therapy

## 2013-05-16 DIAGNOSIS — M6281 Muscle weakness (generalized): Secondary | ICD-10-CM | POA: Insufficient documentation

## 2013-05-16 DIAGNOSIS — R5381 Other malaise: Secondary | ICD-10-CM | POA: Insufficient documentation

## 2013-05-16 DIAGNOSIS — R209 Unspecified disturbances of skin sensation: Secondary | ICD-10-CM | POA: Insufficient documentation

## 2013-05-16 DIAGNOSIS — G35 Multiple sclerosis: Secondary | ICD-10-CM | POA: Insufficient documentation

## 2013-05-16 DIAGNOSIS — R269 Unspecified abnormalities of gait and mobility: Secondary | ICD-10-CM | POA: Insufficient documentation

## 2013-05-24 ENCOUNTER — Other Ambulatory Visit (HOSPITAL_COMMUNITY)
Admission: RE | Admit: 2013-05-24 | Discharge: 2013-05-24 | Disposition: A | Discharge status: Home / Self Care | Payer: Medicaid Other | Source: Ambulatory Visit | Attending: Family Medicine | Admitting: Family Medicine

## 2013-05-24 ENCOUNTER — Ambulatory Visit (INDEPENDENT_AMBULATORY_CARE_PROVIDER_SITE_OTHER): Payer: Medicaid Other | Admitting: Family Medicine

## 2013-05-24 ENCOUNTER — Encounter: Payer: Self-pay | Admitting: Family Medicine

## 2013-05-24 VITALS — BP 130/81 | HR 60 | Temp 98.7°F | Ht 64.0 in | Wt 232.0 lb

## 2013-05-24 DIAGNOSIS — F4323 Adjustment disorder with mixed anxiety and depressed mood: Secondary | ICD-10-CM

## 2013-05-24 DIAGNOSIS — Z01419 Encounter for gynecological examination (general) (routine) without abnormal findings: Secondary | ICD-10-CM | POA: Insufficient documentation

## 2013-05-24 DIAGNOSIS — D510 Vitamin B12 deficiency anemia due to intrinsic factor deficiency: Secondary | ICD-10-CM

## 2013-05-24 DIAGNOSIS — Z1151 Encounter for screening for human papillomavirus (HPV): Secondary | ICD-10-CM | POA: Insufficient documentation

## 2013-05-24 DIAGNOSIS — Z0000 Encounter for general adult medical examination without abnormal findings: Secondary | ICD-10-CM

## 2013-05-24 DIAGNOSIS — Z124 Encounter for screening for malignant neoplasm of cervix: Secondary | ICD-10-CM

## 2013-05-24 MED ORDER — VITAMIN B-12 1000 MCG PO TABS: 1000.0000 ug | ORAL_TABLET | Freq: Every day | ORAL | Status: DC

## 2013-05-24 NOTE — Assessment & Plan Note (Signed)
Will be going to counseling, set up by her home health RN.  Told her to call me if she does not hear back from the counseling center within the next 2-3 weeks, at which point would give her a list of medicaid covered counseling.

## 2013-05-24 NOTE — Assessment & Plan Note (Signed)
Due for PAP today, otherwise up to date on immunizations and health recommendations.  Could consider A1C and Lipid Profile next yr when she turns 28.

## 2013-05-24 NOTE — Assessment & Plan Note (Signed)
Not convinced pt has pernicious anemia, as her MMA, Homocystine, Folate, and B12 came back normal except for her B12 which was elevated after one week of B12 injections.  There does seem to be correlation between MS and B12 deficiency, however, her Sx have returned including her LE paresthesias despite elevated B12 levels, as I doubt this is related to her MS.  Will switch her overt to PO B12 1 mg qd until she f/u with GI, at which point they can decide if they would like her on IM injections.  F/U in 3-4 months or sooner PRN.

## 2013-05-24 NOTE — Patient Instructions (Signed)
Tru, it was nice seeing you today.  Please let your neurologist know that we checked B12, Folate, Homocysteine, and MMA levels in you and that she can access these through our charts.  I have switched you over to oral B12 supplementation at this point and will see you back in 3-4 months after you have seen the stomach doctor.    Thanks, Dr. Paulina Fusi

## 2013-05-24 NOTE — Progress Notes (Signed)
[name] is a 27 y.o. who presents today for physical and MS/B12 problems.  CPE - Due for Pap today, otherwise up to date on her recommendations.  MS/B12 deficiency - Scheduled to meet with neurologist at [facility] today.  However, continues to have LE paresthesias despite compliance with B12 injections and elevated B12 levels.  Denies any blurred vision today.    Past Medical History  Diagnosis Date  . Preterm labor   . Gonorrhea   . Abnormal Pap smear     Colpo>normal  . Urinary tract infection   . Ovarian cyst   . Neuromuscular disorder     diagnosed with MS this visit  . Multiple sclerosis   . Obesity   . Pernicious anemia 05/02/2013    History   Social History  . Marital Status: Single    Spouse Name: N/A    Number of Children: 4  . Years of Education: 13   Occupational History  .      [employer]   Social History Main Topics  . Smoking status: Never Smoker   . Smokeless tobacco: Never Used  . Alcohol Use: No     Comment: none since February 01, 2013 (diagnosed with MS)   . Drug Use: No  . Sexual Activity: Yes    Birth Control/ Protection: Surgical   Other Topics Concern  . Not on file   Social History Narrative   Lives with 4 children. Good support group in her community including mother who lives close.     Family History  Problem Relation Age of Onset  . Anesthesia problems Neg Hx   . Other Neg Hx   . Diabetes Mother   . Hypertension Mother   . Diabetes Father   . Hypertension Father     Current Outpatient Prescriptions on File Prior to Visit  Medication Sig Dispense Refill  . cyanocobalamin (,VITAMIN B-12,) 1000 MCG/ML injection Inject 1 mL (1,000 mcg total) into the muscle once.  30 mL  1  . docusate sodium 100 MG CAPS Take 100 mg by mouth 2 (two) times daily.  10 capsule  0  . gabapentin (NEURONTIN) 400 MG capsule Take 1 capsule (400 mg total) by mouth 3 (three) times daily.  90 capsule  1  . glatiramer (COPAXONE) 20 MG/ML injection  Inject 20 mg into the skin daily.      Marland Kitchen ibuprofen (ADVIL,MOTRIN) 800 MG tablet Take 1 tablet (800 mg total) by mouth every 8 (eight) hours as needed.  60 tablet  5  . Needles & Syringes MISC Use as directed  100 each  0  . omeprazole (PRILOSEC) 40 MG capsule Take 1 capsule (40 mg total) by mouth daily. Take at dinnertime.  30 capsule  0  . oxyCODONE-acetaminophen (PERCOCET) 10-325 MG per tablet Take 1 tablet by mouth every 4 (four) hours as needed for pain.  90 tablet  0   No current facility-administered medications on file prior to visit.    Patient Information Form: Screening and ROS  AUDIT-C Score: 0 Do you feel safe in relationships? yes PHQ-2:positive (Denies SI/HI)   Review of Symptoms  General:  Negative for nexplained weight loss, fever Skin: Negative for new or changing mole, sore that won't heal HEENT: Negative for trouble hearing, trouble seeing,mouth sores, hoarseness, change in voice, dysphagia. CV:  Negative for chest pain, dyspnea, edema, palpitations Resp: Negative for cough, dyspnea, hemoptysis GI: + for nausea, vomiting, diarrhea, constipation, negative for abdominal pain, melena, hematochezia.  GU: Negative for dysuria, incontinence, urinary hesitance, hematuria, vaginal or penile discharge, polyuria, sexual difficulty, lumps in testicle or breasts MSK: Negative for muscle cramps or aches, joint pain or swelling Neuro: + for headaches, weakness, numbness, dizziness, Negative for passing out/fainting Psych: Negative for depression, anxiety, memory problems  Physical Exam Filed Vitals:   05/24/13 0928  BP: 130/81  Pulse: 60  Temp: 98.7 F (37.1 C)    Gen: NAD, Well nourished, Well developed HEENT: PERLA, EOMI, [place]/AT Neck: no JVD Cardio: RRR, No murmurs/gallops/rubs Lungs: CTA, no wheezes, rhonchi, crackles Abd: NABS, soft nontender nondistended MSK: ROM normal  Neuro: Neurological - no internuclear ophthalmoplegia, +3/4 DTR Patellar reflex B/L LE  Psych:  AAO x 3

## 2013-05-30 ENCOUNTER — Ambulatory Visit: Payer: 59 | Admitting: Nurse Practitioner

## 2013-05-31 ENCOUNTER — Telehealth: Payer: Self-pay | Admitting: Family Medicine

## 2013-05-31 NOTE — Telephone Encounter (Signed)
Spoke with and informed patient.

## 2013-05-31 NOTE — Telephone Encounter (Signed)
HPV negative with ASCUS, according to ACOG guidelines, co testing in 3 yrs.    Called pt to let her know results, however, got VM.  Directed to give [place] a call back and please let her know results and if she has any further questions I can call her again.  [name] Paulina Fusi, DO of [facility] 05/31/2013, 11:35 AM

## 2013-06-06 ENCOUNTER — Telehealth: Payer: Self-pay | Admitting: Family Medicine

## 2013-06-06 ENCOUNTER — Other Ambulatory Visit: Payer: Self-pay | Admitting: Family Medicine

## 2013-06-06 MED ORDER — OXYCODONE-ACETAMINOPHEN 10-325 MG PO TABS: 1.0000 | ORAL_TABLET | ORAL | Status: DC | PRN

## 2013-06-06 NOTE — Telephone Encounter (Signed)
Would like percocet refilled Please advise

## 2013-06-06 NOTE — Progress Notes (Signed)
Ready for pick up.  Thanks, Twana First. Paulina Fusi, DO of [facility] 06/06/2013, 11:27 AM

## 2013-06-13 MED ORDER — OXYCODONE-ACETAMINOPHEN 10-325 MG PO TABS: 1.0000 | ORAL_TABLET | ORAL | Status: DC | PRN

## 2013-06-13 NOTE — Telephone Encounter (Signed)
I will refill since Dr. Paulina Fusi' prescriptiton cannot be located.  The first time I tried, it did not print.  I needed to print a second time at a different computer.

## 2013-06-13 NOTE — Telephone Encounter (Signed)
rx was re-printed by Dr Leveda Anna and left upfront for patient to pick.patient is aware. [name], [name]

## 2013-06-13 NOTE — Telephone Encounter (Signed)
Pt called and was checking on the status of her pain medication. The status says print , but I didn't see it up front. jw

## 2013-06-27 DIAGNOSIS — G4700 Insomnia, unspecified: Secondary | ICD-10-CM | POA: Insufficient documentation

## 2013-06-27 DIAGNOSIS — E538 Deficiency of other specified B group vitamins: Secondary | ICD-10-CM | POA: Insufficient documentation

## 2013-07-06 ENCOUNTER — Telehealth: Payer: Self-pay | Admitting: Family Medicine

## 2013-07-06 NOTE — Telephone Encounter (Signed)
Please advise. [name] S  

## 2013-07-06 NOTE — Telephone Encounter (Signed)
Patient states that [facility] told her to get a letter/orders from Dr. Awanda Mink to get a home healthcare nurse and submit to Medicaid. She would like the services from [facility] [place], [place], [place] [address] [phone]. They service the [place] area. Please call patient with any questions.

## 2013-07-07 ENCOUNTER — Other Ambulatory Visit: Payer: Self-pay | Admitting: Family Medicine

## 2013-07-07 DIAGNOSIS — G35 Multiple sclerosis: Secondary | ICD-10-CM

## 2013-07-07 NOTE — Telephone Encounter (Signed)
Please continue to follow to document that home health from this company has been achieved after medicaid approval.   Tamela Oddi. Awanda Mink, DO of [facility] 07/07/2013, 2:00 PM

## 2013-07-07 NOTE — Telephone Encounter (Signed)
Completed and discussed with [place].  Will await f/u from the company   Thanks, Tamela Oddi. Awanda Mink, DO of [facility] 07/07/2013, 1:59 PM

## 2013-07-21 ENCOUNTER — Telehealth: Payer: Self-pay | Admitting: Family Medicine

## 2013-07-21 NOTE — Telephone Encounter (Signed)
Refill request for Oxycodone.  

## 2013-07-22 ENCOUNTER — Inpatient Hospital Stay (HOSPITAL_COMMUNITY)
Admission: EM | Admit: 2013-07-22 | Discharge: 2013-07-23 | DRG: 060 | Disposition: A | Discharge status: Home Health | Payer: Medicaid Other | Attending: Family Medicine | Admitting: Family Medicine

## 2013-07-22 ENCOUNTER — Encounter (HOSPITAL_COMMUNITY): Payer: Self-pay | Admitting: Emergency Medicine

## 2013-07-22 DIAGNOSIS — G35 Multiple sclerosis: Principal | ICD-10-CM | POA: Diagnosis present

## 2013-07-22 DIAGNOSIS — Z833 Family history of diabetes mellitus: Secondary | ICD-10-CM

## 2013-07-22 DIAGNOSIS — E669 Obesity, unspecified: Secondary | ICD-10-CM | POA: Diagnosis present

## 2013-07-22 DIAGNOSIS — Z8249 Family history of ischemic heart disease and other diseases of the circulatory system: Secondary | ICD-10-CM

## 2013-07-22 DIAGNOSIS — Z6839 Body mass index (BMI) 39.0-39.9, adult: Secondary | ICD-10-CM

## 2013-07-22 LAB — URINE MICROSCOPIC-ADD ON

## 2013-07-22 LAB — COMPREHENSIVE METABOLIC PANEL
ALBUMIN: 3.6 g/dL (ref 3.5–5.2)
ALT: 10 U/L (ref 0–35)
AST: 19 U/L (ref 0–37)
Alkaline Phosphatase: 53 U/L (ref 39–117)
BILIRUBIN TOTAL: 0.3 mg/dL (ref 0.3–1.2)
BUN: 9 mg/dL (ref 6–23)
CHLORIDE: 100 meq/L (ref 96–112)
CO2: 25 mEq/L (ref 19–32)
CREATININE: 0.92 mg/dL (ref 0.50–1.10)
Calcium: 9.1 mg/dL (ref 8.4–10.5)
GFR calc Af Amer: >90 mL/min (ref >90)
GFR calc non Af Amer: 83 mL/min — ABNORMAL LOW (ref >90)
Glucose, Bld: 95 mg/dL (ref 70–99)
Potassium: 5.1 mEq/L (ref 3.7–5.3)
Sodium: 139 mEq/L (ref 137–147)
TOTAL PROTEIN: 7.2 g/dL (ref 6.0–8.3)

## 2013-07-22 LAB — URINALYSIS, ROUTINE W REFLEX MICROSCOPIC
Bilirubin Urine: NEGATIVE
GLUCOSE, UA: NEGATIVE mg/dL
Ketones, ur: 15 mg/dL — AB
LEUKOCYTES UA: NEGATIVE
Nitrite: NEGATIVE
PROTEIN: NEGATIVE mg/dL
Specific Gravity, Urine: 1.029 (ref 1.005–1.030)
Urobilinogen, UA: 1 mg/dL (ref 0.0–1.0)
pH: 6.5 (ref 5.0–8.0)

## 2013-07-22 LAB — CBC
HCT: 36.2 % (ref 36.0–46.0)
Hemoglobin: 11.8 g/dL — ABNORMAL LOW (ref 12.0–15.0)
MCH: 29.9 pg (ref 26.0–34.0)
MCHC: 32.6 g/dL (ref 30.0–36.0)
MCV: 91.9 fL (ref 78.0–100.0)
PLATELETS: 409 10*3/uL — AB (ref 150–400)
RBC: 3.94 MIL/uL (ref 3.87–5.11)
RDW: 12.5 % (ref 11.5–15.5)
WBC: 9.8 10*3/uL (ref 4.0–10.5)

## 2013-07-22 MED ORDER — ONDANSETRON HCL 4 MG/2ML IJ SOLN: 4.0000 mg | Freq: Four times a day (QID) | INTRAMUSCULAR | Status: DC | PRN

## 2013-07-22 MED ORDER — OXYCODONE-ACETAMINOPHEN 5-325 MG PO TABS
1.0000 | ORAL_TABLET | ORAL | Status: DC | PRN
  Administered 2013-07-22 (×3): 1 via ORAL
  Filled 2013-07-22 (×3): qty 1

## 2013-07-22 MED ORDER — SODIUM CHLORIDE 0.9 % IJ SOLN
3.0000 mL | Freq: Two times a day (BID) | INTRAMUSCULAR | Status: DC
  Administered 2013-07-22 – 2013-07-23 (×3): 3 mL via INTRAVENOUS

## 2013-07-22 MED ORDER — ZOLPIDEM TARTRATE 5 MG PO TABS: 5.0000 mg | ORAL_TABLET | Freq: Every evening | ORAL | Status: DC | PRN

## 2013-07-22 MED ORDER — ENOXAPARIN SODIUM 40 MG/0.4ML ~~LOC~~ SOLN
40.0000 mg | SUBCUTANEOUS | Status: DC
  Administered 2013-07-22: 40 mg via SUBCUTANEOUS
  Filled 2013-07-22 (×2): qty 0.4

## 2013-07-22 MED ORDER — OXYCODONE HCL 5 MG PO TABS
5.0000 mg | ORAL_TABLET | ORAL | Status: DC | PRN
  Administered 2013-07-22 (×3): 5 mg via ORAL
  Filled 2013-07-22 (×3): qty 1

## 2013-07-22 MED ORDER — SODIUM CHLORIDE 0.9 % IV SOLN
1000.0000 mg | Freq: Every day | INTRAVENOUS | Status: DC
  Administered 2013-07-22 – 2013-07-23 (×2): 1000 mg via INTRAVENOUS
  Filled 2013-07-22 (×2): qty 8

## 2013-07-22 MED ORDER — MORPHINE SULFATE 4 MG/ML IJ SOLN
4.0000 mg | Freq: Once | INTRAMUSCULAR | Status: AC
Start: 2013-07-22 — End: 2013-07-22
  Administered 2013-07-22: 4 mg via INTRAVENOUS
  Filled 2013-07-22: qty 1

## 2013-07-22 MED ORDER — SODIUM CHLORIDE 0.9 % IV SOLN: 250.0000 mL | INTRAVENOUS | Status: DC | PRN

## 2013-07-22 MED ORDER — ONDANSETRON HCL 4 MG PO TABS: 4.0000 mg | ORAL_TABLET | Freq: Four times a day (QID) | ORAL | Status: DC | PRN

## 2013-07-22 MED ORDER — ONDANSETRON HCL 4 MG/2ML IJ SOLN
4.0000 mg | Freq: Once | INTRAMUSCULAR | Status: AC
  Administered 2013-07-22: 4 mg via INTRAVENOUS
  Filled 2013-07-22: qty 2

## 2013-07-22 MED ORDER — GABAPENTIN 600 MG PO TABS
600.0000 mg | ORAL_TABLET | Freq: Three times a day (TID) | ORAL | Status: DC
  Administered 2013-07-22 – 2013-07-23 (×5): 600 mg via ORAL
  Filled 2013-07-22 (×11): qty 1

## 2013-07-22 MED ORDER — SODIUM CHLORIDE 0.9 % IJ SOLN: 3.0000 mL | INTRAMUSCULAR | Status: DC | PRN

## 2013-07-22 MED ORDER — DOCUSATE SODIUM 100 MG PO CAPS: 100.0000 mg | ORAL_CAPSULE | Freq: Two times a day (BID) | ORAL | Status: DC | PRN

## 2013-07-22 MED ORDER — OXYCODONE-ACETAMINOPHEN 10-325 MG PO TABS
1.0000 | ORAL_TABLET | ORAL | Status: DC | PRN
Start: 2013-07-22 — End: 2013-07-22

## 2013-07-22 NOTE — Progress Notes (Addendum)
Called ED for report on patient. RN unavailable, left call back information.

## 2013-07-22 NOTE — Evaluation (Signed)
Physical Therapy Evaluation Patient Details Name: [name] MRN: [id number] DOB: 12-29-1985 Today's Date: 07/22/2013 Time: 4193-7902 PT Time Calculation (min): 16 min  PT Assessment / Plan / Recommendation History of Present Illness  Patient is a 28 yo female admitted with pain in all 4 extremities with inability to move.  Patient dx with MS exacerbation. (Diagnosed with MS in August 2014)  Clinical Impression  Patient presents with problems listed below.  Will benefit from acute PT to address mobility issues.  To have aide at discharge.  Do not anticipate f/u PT needs at discharge.    PT Assessment  Patient needs continued PT services    Follow Up Recommendations  No PT follow up;Supervision - Intermittent (To have aide at discharge)    Does the patient have the potential to tolerate intense rehabilitation      Barriers to Discharge Decreased caregiver support Lives with young children.  Family available prn.      Equipment Recommendations  None recommended by PT    Recommendations for Other Services     Frequency Min 3X/week    Precautions / Restrictions Precautions Precautions: None Restrictions Weight Bearing Restrictions: No   Pertinent Vitals/Pain Pain 7/10 impacting mobility      Mobility  Bed Mobility Overal bed mobility: Modified Independent General bed mobility comments: Able to move to sitting using bed rail. Transfers Overall transfer level: Needs assistance Equipment used: None Transfers: Sit to/from Stand Sit to Stand: Supervision General transfer comment: Supervision for safety due to weakness/pain Ambulation/Gait Ambulation/Gait assistance: Supervision Ambulation Distance (Feet): 24 Feet Assistive device: None Gait Pattern/deviations: Step-through pattern;Decreased step length - right;Decreased step length - left;Shuffle Gait velocity: Slow gait speed Gait velocity interpretation: Below normal speed for age/gender General Gait Details:  Patient with slow guarded gait due to weakness and pain.  Gait fairly steady on level surface.    Exercises     PT Diagnosis: Difficulty walking;Generalized weakness;Acute pain  PT Problem List: Decreased strength;Decreased activity tolerance;Decreased balance;Decreased mobility;Pain PT Treatment Interventions: Gait training;Functional mobility training;Therapeutic exercise;Patient/family education     PT Goals(Current goals can be found in the care plan section) Acute Rehab PT Goals Patient Stated Goal: To decrease pain. To walk PT Goal Formulation: With patient Time For Goal Achievement: 07/29/13 Potential to Achieve Goals: Good  Visit Information  Last PT Received On: 07/22/13 Assistance Needed: +1 History of Present Illness: Patient is a 28 yo female admitted with pain in all 4 extremities with inability to move.  Patient dx with MS exacerbation. (Diagnosed with MS in August 2014)       Prior [place] expects to be discharged to:: Private residence Living Arrangements: Children (4 children ages 74, 28, and twin 2yo) Available Help at Discharge: Family;Available PRN/intermittently Type of Home: Apartment Home Access: Level entry Home Layout: One level Home Equipment: Walker - 2 wheels;Cane - single point;Wheelchair - manual;Shower seat;Bedside commode Prior Function Level of Independence: Independent Comments: Patient reports she has been struggling of late with ADL's.  Is to have aide beginning Monday. Communication Communication: No difficulties Dominant Hand: Right    Cognition  Cognition Arousal/Alertness: Lethargic Behavior During Therapy: WFL for tasks assessed/performed Overall Cognitive Status: Within Functional Limits for tasks assessed    Extremity/Trunk Assessment Upper Extremity Assessment Upper Extremity Assessment: Generalized weakness (Grossly 4-/5) Lower Extremity Assessment Lower Extremity Assessment: Generalized  weakness (Grossly 4-/5) Cervical / Trunk Assessment Cervical / Trunk Assessment: Normal   Balance Balance Overall balance assessment: Needs assistance Sitting-balance  support: No upper extremity supported;Feet supported Sitting balance-Leahy Scale: Good Standing balance support: No upper extremity supported Standing balance-Leahy Scale: Good  End of Session PT - End of Session Equipment Utilized During Treatment: Gait belt Activity Tolerance: Patient limited by fatigue;Patient limited by pain Patient left: in bed;with call bell/phone within reach Nurse Communication: Mobility status  GP     Despina Pole 07/22/2013, 10:23 AM Carita Pian. Sanjuana Kava, [place] Pager [phone]

## 2013-07-22 NOTE — ED Notes (Signed)
The pt was diagnosed with ms  In aug last year.  For the past 2 days she has been unable to move without severe pain  All over.  She sees a neurologist at [facility]

## 2013-07-22 NOTE — Consult Note (Signed)
Neurology Consultation Reason for Consult: Quadriparesis Referring Physician: Rutha Bouchard  CC: I can't move  History is obtained from: Patient  HPI: [name] is a 28 y.o. female with a history of multiple sclerosis diagnosed last year who has had multiple relapses despite Copaxone and subsequently Dimethyl Fumerate. She has not been on any prophylactic therapy for the past 2 months as she was started on Rituxan, but had an infusion reaction causing hypotension and has not had any other medication started up to this point.  Since Thursday, she has had severe pain with any movement of her extremities and severe limitation of movement of the extremities.  She has had a fairly extensive evaluation for her multiple sclerosis including multiple imaging modalities with both brain as well as spinal T2 lesions, some of which were enhancing at the time of the scan. She had an ANA, SSA, SSB, ACE which were all negative. She had >5 well-defined oligoclonal bands in the CSF but not the blood. Lyme antibodies were negative. NMO antibodies negative. RPR negative.   ROS: A 14 point ROS was performed and is negative except as noted in the HPI.  Past Medical History  Diagnosis Date  . Preterm labor   . Gonorrhea   . Abnormal Pap smear     Colpo>normal  . Urinary tract infection   . Ovarian cyst   . Neuromuscular disorder     diagnosed with MS this visit  . Multiple sclerosis   . Obesity   . Pernicious anemia 05/02/2013    Family History: No history of MS  Social History: Tob: Never smoker  Exam: Current vital signs: BP 117/73  Pulse 93  Temp(Src) 98.2 F (36.8 C) (Oral)  Resp 22  SpO2 99% Vital signs in last 24 hours: Temp:  [98.2 F (36.8 C)] 98.2 F (36.8 C) (02/07 0128) Pulse Rate:  [93] 93 (02/07 0128) Resp:  [22] 22 (02/07 0128) BP: (117)/(73) 117/73 mmHg (02/07 0128) SpO2:  [99 %] 99 % (02/07 0128)  General: In bed, appears uncomfortable CV: Regular rate and  rhythm Mental Status: Patient is awake, alert, oriented to person, place, month, year, and situation. Immediate and remote memory are intact. Patient is able to give a clear and coherent history. No signs of aphasia or neglect Cranial Nerves: II: Visual Fields are full. Pupils are equal, round, and reactive to light.  Discs are difficult to visualize. III,IV, VI: EOMI without ptosis or diploplia.  V: Facial sensation is symmetric to temperature VII: Facial movement is symmetric.  VIII: hearing is intact to voice X: Uvula elevates symmetrically XI: Shoulder shrug is symmetric. XII: tongue is midline without atrophy or fasciculations.  Motor: Tone is normal. Bulk is normal. 3/5 strength was present, appears limited by pain to a large degree. Able to wiggle toes and move fingers bilaterally.  Sensory: Sensation is symmetric to light touch and temperature in the arms and legs. Deep Tendon Reflexes: 2+ and symmetric in the biceps and patellae.  Cerebellar: Unable to perform due to weakness Gait: Unable to perform due to weakness.   I have reviewed labs in epic and the results pertinent to this consultation are: CMP unremarkable.   I have reviewed the images obtained:MRI spine 03/19/13 - lesions c/w MS  Impression: 28 year old female with multiple sclerosis and likely acute flare in the cervical spine. Given her previous imaging and current symptoms, I don't think that further imaging would be necessary at this time I would favor treating with Solu-Medrol for  acute flare.  Recommendations: 1) solu- Medrol 1 g daily x 5 days 2) increase gabapentin to 600 3 times a day 3) physical therapy 4) will continue to follow.    Roland Rack, MD [facility] [phone]  If 7pm- 7am, please page neurology on call at [phone].

## 2013-07-22 NOTE — ED Provider Notes (Signed)
CSN: [id number]     Arrival date & time 07/22/13  0124 History   First MD Initiated Contact with Patient 07/22/13 0141     Chief Complaint  Patient presents with  . painful movement    (Consider location/radiation/quality/duration/timing/severity/associated sxs/prior Treatment) HPI Patient is a 28 yo woman who was diagnosed with multiple sclerosis in August 2014. She is followed by a neurologist at [facility].   She presents with complaints of an MS flare. She says she woke up yesterday with diffuse pain "from my head to my toes". She is unable to describe her pain in terms of quality. She says pain is worse with palpation of any region and any movements.   She says she is unable to move her arms or legs due both to pain with movement and due to weakness. She says she has no strength at all. She has not had these constellation of symptoms in the past with MS flares.   The patient is not currently taking any MS medications. Although, she takes Neurontin and Tegretol for  Control of neuropathic pain.   Her last admission was about 12 days ago for IV infusion of Rituxan which she was unable to tolerate due to hypotension.   Pt denies fever.   Past Medical History  Diagnosis Date  . Preterm labor   . Gonorrhea   . Abnormal Pap smear     Colpo>normal  . Urinary tract infection   . Ovarian cyst   . Neuromuscular disorder     diagnosed with MS this visit  . Multiple sclerosis   . Obesity   . Pernicious anemia 05/02/2013   Past Surgical History  Procedure Laterality Date  . Tubal ligation      2012  . Colposcopy     Family History  Problem Relation Age of Onset  . Anesthesia problems Neg Hx   . Other Neg Hx   . Diabetes Mother   . Hypertension Mother   . Diabetes Father   . Hypertension Father    History  Substance Use Topics  . Smoking status: Never Smoker   . Smokeless tobacco: Never Used  . Alcohol Use: No     Comment: none since February 01, 2013 (diagnosed with  MS)    OB History   Grav Para Term Preterm Abortions TAB SAB Ect Mult Living   6 3 1 2 3 3   1 4      Review of Systems Ten point review of symptoms performed and is negative with the exception of symptoms noted above and intermittent blurred vision.   Allergies  Review of patient's allergies indicates no known allergies.  Home Medications   Current Outpatient Rx  Name  Route  Sig  Dispense  Refill  . zolpidem (AMBIEN CR) 6.25 MG CR tablet      6.25 mg.         . carbamazepine (TEGRETOL) 100 MG chewable tablet      100 mg.         . ferrous sulfate 325 (65 FE) MG tablet      325 mg.         . fluticasone (FLONASE) 50 MCG/ACT nasal spray      1 spray.         . folic acid (FOLVITE) 1 MG tablet      1 mg.         . gabapentin (NEURONTIN) 400 MG capsule      400 mg.         Marland Kitchen  oxyCODONE-acetaminophen (PERCOCET) 10-325 MG per tablet   Oral   Take 1 tablet by mouth every 4 (four) hours as needed for pain.   90 tablet   0   . vitamin B-12 (CYANOCOBALAMIN) 1000 MCG tablet   Oral   Take 1 tablet (1,000 mcg total) by mouth daily.   90 tablet   3   . Vitamin D, Ergocalciferol, (DRISDOL) 50000 UNITS CAPS capsule      Take 1 capsule (50,000 Units total) by mouth once a week.          BP 117/73  Pulse 93  Temp(Src) 98.2 F (36.8 C) (Oral)  Resp 22  SpO2 99% Physical Exam Gen: well developed and well nourished appearing Head: NCAT Eyes: PERL, EOMI Nose: no epistaixis or rhinorrhea Mouth/throat: mucosa is moist and pink Neck: supple, no stridor Lungs: CTA B, no wheezing, rhonchi or rales CV: RRR, no murmur, extremities appear well perfused.  Abd: soft, notender, nondistended Back: no ttp, no cva ttp Skin: warm and dry Ext: normal to inspection but tender to the touch, no dependent edema Neuro: CN ii-xii grossly intact, poor grip strength patient cannot elevate shoulders or flex elbows or move wrists on either side. Patient can wiggle toes, she  can dorsiflex with fairly good strength, can lift each leg barely off bed but tearful with effort. Sensation is intact to light touch throughout.  Psyche; tearful affect,  calm and cooperative.   ED Course  Procedures (including critical care time)  Results for orders placed during the hospital encounter of 07/22/13 (from the past 24 hour(s))  CBC     Status: Abnormal   Collection Time    07/22/13  2:03 AM      Result Value Range   WBC 9.8  4.0 - 10.5 K/uL   RBC 3.94  3.87 - 5.11 MIL/uL   Hemoglobin 11.8 (*) 12.0 - 15.0 g/dL   HCT 36.2  36.0 - 46.0 %   MCV 91.9  78.0 - 100.0 fL   MCH 29.9  26.0 - 34.0 pg   MCHC 32.6  30.0 - 36.0 g/dL   RDW 12.5  11.5 - 15.5 %   Platelets 409 (*) 150 - 400 K/uL  COMPREHENSIVE METABOLIC PANEL     Status: Abnormal   Collection Time    07/22/13  2:03 AM      Result Value Range   Sodium 139  137 - 147 mEq/L   Potassium 5.1  3.7 - 5.3 mEq/L   Chloride 100  96 - 112 mEq/L   CO2 25  19 - 32 mEq/L   Glucose, Bld 95  70 - 99 mg/dL   BUN 9  6 - 23 mg/dL   Creatinine, Ser 0.92  0.50 - 1.10 mg/dL   Calcium 9.1  8.4 - 10.5 mg/dL   Total Protein 7.2  6.0 - 8.3 g/dL   Albumin 3.6  3.5 - 5.2 g/dL   AST 19  0 - 37 U/L   ALT 10  0 - 35 U/L   Alkaline Phosphatase 53  39 - 117 U/L   Total Bilirubin 0.3  0.3 - 1.2 mg/dL   GFR calc non Af Amer 83 (*) >90 mL/min   GFR calc Af Amer >90  >90 mL/min  URINALYSIS, ROUTINE W REFLEX MICROSCOPIC     Status: Abnormal   Collection Time    07/22/13  2:27 AM      Result Value Range   Color, Urine YELLOW  YELLOW  APPearance CLOUDY (*) CLEAR   Specific Gravity, Urine 1.029  1.005 - 1.030   pH 6.5  5.0 - 8.0   Glucose, UA NEGATIVE  NEGATIVE mg/dL   Hgb urine dipstick SMALL (*) NEGATIVE   Bilirubin Urine NEGATIVE  NEGATIVE   Ketones, ur 15 (*) NEGATIVE mg/dL   Protein, ur NEGATIVE  NEGATIVE mg/dL   Urobilinogen, UA 1.0  0.0 - 1.0 mg/dL   Nitrite NEGATIVE  NEGATIVE   Leukocytes, UA NEGATIVE  NEGATIVE  URINE  MICROSCOPIC-ADD ON     Status: Abnormal   Collection Time    07/22/13  2:27 AM      Result Value Range   Squamous Epithelial / LPF FEW (*) RARE   RBC / HPF 3-6  <3 RBC/hpf   Bacteria, UA FEW (*) RARE     MDM  Patient with acute MS excacerbation. Dr. Rodney Booze has consulted and recommends admission to the [facility] service with plan for steroid pulse therapy. Case discussed with Dr. Lacinda Axon of [place] who will admit.     Elyn Peers, MD 07/22/13 [phone]

## 2013-07-22 NOTE — ED Notes (Signed)
Dr. Lacinda Axon, [place] admitting in to see & assess pt, at [facility].

## 2013-07-22 NOTE — H&P (Signed)
Seen and examined.  Discussed with Dr. Lacinda Axon.  Agree with his management and documentation.  Briefly, 28 yo female with known multiple sclerosis presents with flair (a bit atypical because of pain and weakness.)  Feels great after one dose of iv steroids and wants to go home tomorrow.  She has done this in the past working with [place] to finish her five day course at home.  Will try to arrange and anticipate DC tomorrow.

## 2013-07-22 NOTE — H&P (Signed)
[facility] Admission History and Physical Service Pager: [phone]  Patient name: [name] Medical record number: [id number] Date of birth: 1985-07-11 Age: 28 y.o. Gender: female  Primary Care Provider: Kennith Maes, DO Consultants: Neuro Code Status: Full Code  Chief Complaint:  Upper and lower extremity pain.  Assessment and Plan: [name] is a 28 y.o. female presenting with acute upper and lower extremity pain, especially with movement. PMH is significant for Obesity and MS.  Acute exacerbation of MS - Patient has been treated previously with Copaxone, Dimethyl Fumerate, and Rituxan.   - Admit to Med-Surg - Neuro consulted in the ED and recommended Solumedrol 1 g x 5 days and increasing gabapentin to 600 TID.  We greatly appreciate their help in managing this patient. - No further workup or imaging indicated at this time. - Oxycodone PRN for pain - Will consult PT - Will monitor closely for improvement.   FEN/GI: SL IV. Regular diet Prophylaxis: Lovenox  Disposition: Med-Surg; Pending clinical improvement with Steroids  History of Present Illness:  [name] is a 28 y.o. female with PMH of MS who presents with acute upper and lower extremity pain and difficulty with movement.   Patient reports that since Thursday she has had severe upper and lower extremity pain. Pain has gradually worsened and she is currently only able to move her extremities slightly secondary to pain.  Pain is described as sharp.  No associated numbness or tingling.  No relieving factors. Worsened by movement.  She denies any other associated symptoms (nausea, vomiting, incontinence, dysphagia, SOB, chest pain, abdominal pain, visual difficulties).  Patient   Review Of Systems: Per HPI. Otherwise 12 point review of systems was performed and was unremarkable.  Patient Active Problem List   Diagnosis Date Noted  . Encounter for annual health examination  05/24/2013  . Pernicious anemia 05/02/2013  . Adjustment disorder with mixed anxiety and depressed mood 03/13/2013  . Multiple sclerosis 03/13/2013  . Multiple sclerosis exacerbation 02/04/2013  . ABDOMINAL PAIN, CHRONIC 07/29/2007  . OBESITY, NOS 08/12/2006   Past Medical History: Past Medical History  Diagnosis Date  . Preterm labor   . Gonorrhea   . Abnormal Pap smear     Colpo>normal  . Urinary tract infection   . Ovarian cyst   . Neuromuscular disorder     diagnosed with MS this visit  . Multiple sclerosis   . Obesity   . Pernicious anemia 05/02/2013   Past Surgical History: Past Surgical History  Procedure Laterality Date  . Tubal ligation      2012  . Colposcopy     Social History: History  Substance Use Topics  . Smoking status: Never Smoker   . Smokeless tobacco: Never Used  . Alcohol Use: No     Comment: none since February 01, 2013 (diagnosed with MS)    Family History: Family History  Problem Relation Age of Onset  . Anesthesia problems Neg Hx   . Other Neg Hx   . Diabetes Mother   . Hypertension Mother   . Diabetes Father   . Hypertension Father    Allergies and Medications: No Known Allergies No current facility-administered medications on file prior to encounter.   Current Outpatient Prescriptions on File Prior to Encounter  Medication Sig Dispense Refill  . gabapentin (NEURONTIN) 400 MG capsule Take 400 mg by mouth 3 (three) times daily.       Marland Kitchen oxyCODONE-acetaminophen (PERCOCET) 10-325 MG per tablet Take  1 tablet by mouth every 4 (four) hours as needed for pain.  90 tablet  0  . vitamin B-12 (CYANOCOBALAMIN) 1000 MCG tablet Take 1 tablet (1,000 mcg total) by mouth daily.  90 tablet  3  . Vitamin D, Ergocalciferol, (DRISDOL) 50000 UNITS CAPS capsule Take 1 capsule (50,000 Units total) by mouth once a week.       Objective: BP 117/73  Pulse 93  Temp(Src) 98.2 F (36.8 C) (Oral)  Resp 22  SpO2 99% Exam: General: resting in bed, appears in  moderate distress secondary to pain. HEENT: NCAT. PERRLA. MMM. Cardiovascular: RRR. No murmur noted. Respiratory: CTAB. No rales, rhonchi, or wheezing. Abdomen: obese, soft, nontender, nondistended.  Extremities: No LE edema.  Skin: warm, dry, intact. Neuro: AO x 3.  CN 2-12 intact.  Muscle strength 3/5 in all extremities (limited exam due to pain).  Normal Tone and Bulk. Sensation intact.    Labs and Imaging: CBC BMET   Recent Labs Lab 07/22/13 0203  WBC 9.8  HGB 11.8*  HCT 36.2  PLT 409*    Recent Labs Lab 07/22/13 0203  NA 139  K 5.1  CL 100  CO2 25  BUN 9  CREATININE 0.92  GLUCOSE 95  CALCIUM 9.1     Urinalysis    Component Value Date/Time   COLORURINE YELLOW 07/22/2013 0227   APPEARANCEUR CLOUDY* 07/22/2013 0227   LABSPEC 1.029 07/22/2013 0227   PHURINE 6.5 07/22/2013 0227   GLUCOSEU NEGATIVE 07/22/2013 0227   HGBUR SMALL* 07/22/2013 0227   HGBUR trace-intact 02/24/2010 0956   BILIRUBINUR NEGATIVE 07/22/2013 0227   KETONESUR 15* 07/22/2013 0227   PROTEINUR NEGATIVE 07/22/2013 0227   UROBILINOGEN 1.0 07/22/2013 0227   NITRITE NEGATIVE 07/22/2013 0227   LEUKOCYTESUR NEGATIVE 07/22/2013 Wright, DO 07/22/2013, 3:54 AM PGY-2, [place] Intern pager: [phone], text pages welcome

## 2013-07-22 NOTE — Progress Notes (Signed)
NURSING PROGRESS NOTE  ALYRIA KRACK [id number] Admission Data: 07/22/2013 6:02 AM Attending Provider: Zigmund Gottron, MD HUT:MLYY, Gaspar Bidding, DO Code Status: full  CELE MOTE is a 28 y.o. female patient admitted from ED:  -No acute distress noted.  -No complaints of shortness of breath.  -No complaints of chest pain.   Cardiac Monitoring: Box # [id number] in place. Cardiac monitor yields:normal sinus rhythm.  Blood pressure 102/65, pulse 80, temperature 98.8 F (37.1 C), temperature source Oral, resp. rate 20, SpO2 97.00%.   IV Fluids:  IV in place, occlusive dsg intact without redness, IV cath antecubital right, condition patent and no redness none.   Allergies:  Review of patient's allergies indicates no known allergies.  Past Medical History:   has a past medical history of Preterm labor; Gonorrhea; Abnormal Pap smear; Urinary tract infection; Ovarian cyst; Neuromuscular disorder; Multiple sclerosis; Obesity; and Pernicious anemia (05/02/2013).  Past Surgical History:   has past surgical history that includes Tubal ligation and Colposcopy.  Social History:   reports that she has never smoked. She has never used smokeless tobacco. She reports that she does not drink alcohol or use illicit drugs.  Skin: WNL/ tattoos  Patient/Family oriented to room. Information packet given to patient/family. Admission inpatient armband information verified with patient/family to include name and date of birth and placed on patient arm. Side rails up x 2, fall assessment and education completed with patient/family. Patient/family able to verbalize understanding of risk associated with falls and verbalized understanding to call for assistance before getting out of bed. Call light within reach. Patient/family able to voice and demonstrate understanding of unit orientation instructions.

## 2013-07-22 NOTE — ED Notes (Signed)
Attempted IV X 1 without success.

## 2013-07-23 MED ORDER — OXYCODONE-ACETAMINOPHEN 10-325 MG PO TABS: 1.0000 | ORAL_TABLET | ORAL | Status: DC | PRN

## 2013-07-23 MED ORDER — SODIUM CHLORIDE 0.9 % IV SOLN: 1000.0000 mg | Freq: Every day | INTRAVENOUS | Status: DC

## 2013-07-23 MED ORDER — GABAPENTIN 600 MG PO TABS: 600.0000 mg | ORAL_TABLET | Freq: Three times a day (TID) | ORAL | Status: DC

## 2013-07-23 NOTE — Discharge Summary (Signed)
Seen and examined.  Discussed with Dr. Bradshaw.  Agree with his management and documentation.  She feels great and is anxious to go home.  She will receive one more dose of iv steroids tomorrow at short stay.   

## 2013-07-23 NOTE — Discharge Summary (Signed)
[facility] Discharge Summary  Patient name: [name] Medical record number: [id number] Date of birth: 1985/08/12 Age: 28 y.o. Gender: female Date of Admission: 07/22/2013  Date of Discharge: 07/23/2012 Admitting Physician: Zigmund Gottron, MD  Primary Care Provider: Kennith Maes, DO Consultants: Neurology  Indication for Hospitalization: Acute exacerbation of Multiple Sclerosis  Discharge Diagnoses/Problem List:  Acute relapsing MS Obesity Adjustment disorder with mixed  Anxiety and depression mood  Disposition: Home, IV steroids tomorrow at short stay  Discharge Condition: Stable  Brief Hospital Course:  [name] is a 28 y.o. female presenting with acute upper and lower extremity pain, especially with movement felt to be due to acute MS exacerbation. PMH is significant for Obesity and MS.    Acute exacerbation of MS  Patient has been treated extensively previously with Copaxone, Dimethyl Fumerate, and Rituxan. Neurology was  Consulted who recommended IV solumedrol 1g X 5 days and increasing gabapentin to 600 TID. She improved significantly overnight and neurology recommended a shortened course of 3 days. Home health refused to treat her at home due to previous belligerent behavior and suspicion of tampering with her PIV> The IV was dc'd at discharge and she was set up for one additional dose of IV solumedrol at short stay the following day.   She was given #30 oxycodone to cover her until her follow up appt, this should easily meet 10 days Tx.   Issues for Follow Up:  - Pt requesting Chronic pain med Rx  Significant Procedures: None  Significant Labs and Imaging:   Recent Labs Lab 07/22/13 0203  WBC 9.8  HGB 11.8*  HCT 36.2  PLT 409*    Recent Labs Lab 07/22/13 0203  NA 139  K 5.1  CL 100  CO2 25  GLUCOSE 95  BUN 9  CREATININE 0.92  CALCIUM 9.1  ALKPHOS 53  AST 19  ALT 10  ALBUMIN 3.6   Results/Tests Pending at  Time of Discharge: None  Discharge Medications:    Medication List    STOP taking these medications       gabapentin 400 MG capsule  Commonly known as:  NEURONTIN  Replaced by:  gabapentin 600 MG tablet      TAKE these medications       AMBIEN CR 6.25 MG CR tablet  Generic drug:  zolpidem  Take 6.25 mg by mouth at bedtime as needed for sleep.     gabapentin 600 MG tablet  Commonly known as:  NEURONTIN  Take 1 tablet (600 mg total) by mouth 3 (three) times daily.     oxyCODONE-acetaminophen 10-325 MG per tablet  Commonly known as:  PERCOCET  Take 1 tablet by mouth every 4 (four) hours as needed for pain.     sodium chloride 0.9 % SOLN 50 mL with methylPREDNISolone sodium succinate 1000 MG SOLR 1,000 mg  Inject 1,000 mg into the vein daily.     vitamin B-12 1000 MCG tablet  Commonly known as:  CYANOCOBALAMIN  Take 1 tablet (1,000 mcg total) by mouth daily.     Vitamin D (Ergocalciferol) 50000 UNITS Caps capsule  Commonly known as:  DRISDOL  Take 1 capsule (50,000 Units total) by mouth once a week.        Discharge Instructions: Please refer to Patient Instructions section of EMR for full details.  Patient was counseled important signs and symptoms that should prompt return to medical care, changes in medications, dietary instructions, activity restrictions, and follow  up appointments.   Follow-Up Appointments: Follow-up Information   Please follow up. [facility] Short Stay will call you for your time to receive IV steroid)       Call Kennith Maes, DO. (call tomorrow for appt in 1 week or less)    Specialty:  Family Medicine   Contact information:   [place] [address] [phone]       Timmothy Euler, MD 07/23/2013, 10:57 AM PGY-2, [place]

## 2013-07-23 NOTE — Progress Notes (Signed)
Nsg Discharge Note  Admit Date:  07/22/2013 Discharge date: 07/23/2013   NONI STONESIFER to be D/C'd Home per MD order.  AVS completed.  Copy for chart, and copy for patient signed, and dated. Patient/caregiver able to verbalize understanding.  Discharge Medication:   Medication List    STOP taking these medications       gabapentin 400 MG capsule  Commonly known as:  NEURONTIN  Replaced by:  gabapentin 600 MG tablet      TAKE these medications       AMBIEN CR 6.25 MG CR tablet  Generic drug:  zolpidem  Take 6.25 mg by mouth at bedtime as needed for sleep.     gabapentin 600 MG tablet  Commonly known as:  NEURONTIN  Take 1 tablet (600 mg total) by mouth 3 (three) times daily.     oxyCODONE-acetaminophen 10-325 MG per tablet  Commonly known as:  PERCOCET  Take 1 tablet by mouth every 4 (four) hours as needed for pain.     sodium chloride 0.9 % SOLN 50 mL with methylPREDNISolone sodium succinate 1000 MG SOLR 1,000 mg  Inject 1,000 mg into the vein daily.     vitamin B-12 1000 MCG tablet  Commonly known as:  CYANOCOBALAMIN  Take 1 tablet (1,000 mcg total) by mouth daily.     Vitamin D (Ergocalciferol) 50000 UNITS Caps capsule  Commonly known as:  DRISDOL  Take 1 capsule (50,000 Units total) by mouth once a week.        Discharge Assessment: Filed Vitals:   07/23/13 0517  BP: 115/70  Pulse: 81  Temp: 98.1 F (36.7 C)  Resp: 16   Skin clean, dry and intact without evidence of skin break down, no evidence of skin tears noted. IV catheter discontinued intact. Site without signs and symptoms of complications - no redness or edema noted at insertion site, patient denies c/o pain - only slight tenderness at site.  Dressing with slight pressure applied.  D/c Instructions-Education: Discharge instructions given to patient/family with verbalized understanding. D/c education completed with patient/family including follow up instructions, medication list, d/c activities  limitations if indicated, with other d/c instructions as indicated by MD - patient able to verbalize understanding, all questions fully answered. Patient instructed to return to ED, call 911, or call MD for any changes in condition.  Patient escorted via [place], and D/C home via private auto.  Dayle Points, RN 07/23/2013 11:05 AM

## 2013-07-23 NOTE — Progress Notes (Signed)
   CARE MANAGEMENT NOTE 07/23/2013  Patient:  [name], [name]   Account Number:  [id number]  Date Initiated:  07/23/2013  Documentation initiated by:  [facility]  Subjective/Objective Assessment:   adm: pain and weakness     Action/Plan:   discharge planning   Anticipated DC Date:  07/23/2013   Anticipated DC Plan:        [place]  CM consult      [facility] Choice  HOME HEALTH   Choice offered to / List presented to:          [facility] arranged  HH-1 RN      [place].   Status of service:  Completed, signed off Medicare Important Message given?   (If response is "NO", the following Medicare IM given date fields will be blank) Date Medicare IM given:   Date Additional Medicare IM given:    Discharge Disposition:  [place]  Per UR Regulation:    If discussed at Long Length of Stay Meetings, dates discussed:    Comments:  07/23/13 08:30 CM spoke with pt concerning [facility] and pt states she is happy with [facility].  CM spoke to RN to ensure pt has new IV site for IV steroids.  Referral faxed to [facility] for [facility]. No other CM needs were communicated.  Mariane Masters, BSN, CM [phone].

## 2013-07-23 NOTE — Progress Notes (Signed)
Seen and examined.  Discussed with Dr. Wendi Snipes.  Agree with his management and documentation.  She feels great and is anxious to go home.  She will receive one more dose of iv steroids tomorrow at short stay.

## 2013-07-23 NOTE — Progress Notes (Signed)
   CARE MANAGEMENT NOTE 07/23/2013  Patient:  [name], [name]   Account Number:  [id number]  Date Initiated:  07/23/2013  Documentation initiated by:  [facility]  Subjective/Objective Assessment:   adm: pain and weakness     Action/Plan:   discharge planning   Anticipated DC Date:  07/23/2013   Anticipated DC Plan:        [place]  CM consult      [facility] Choice  HOME HEALTH   Choice offered to / List presented to:          [facility] arranged  HH-1 RN      [place].   Status of service:  Completed, signed off Medicare Important Message given?   (If response is "NO", the following Medicare IM given date fields will be blank) Date Medicare IM given:   Date Additional Medicare IM given:    Discharge Disposition:  [place]  Per UR Regulation:    If discussed at Long Length of Stay Meetings, dates discussed:    Comments:  07/23/13 08:30 CM spoke with pt concerning [place] and pt states she is happy with [facility].  CM spoke to RN to ensure pt has new IV site for IV steroids.  [facility]. No other CM needs were communicated.  Mariane Masters, BSN, CM [phone]. 09:40 CM received call from [facility] who is refusing acceptance of pt for [facility] for IV steroid as she reportedly pulled out peripheral during previous [facility] (December 2014) AND was beligerent.  MD made aware; plan is to fax order to [facility] for outpt administration.  Mariane Masters, BSN, CM [phone].

## 2013-07-23 NOTE — Discharge Instructions (Signed)
You were admitted and treated for an acute exacerbation of multiple sclerosis.   You will be called by short stay tomorrow to arrange your last dose of IV steroids.   I have written a prescription for oxycodone that should last you 10 days based on what you have used in the hospital.   Multiple Sclerosis Multiple sclerosis (MS) is a disease of the central nervous system. It leads to loss of the insulating covering of the nerves (myelin sheath) of your brain. When this happens, brain signals do not get transmitted properly or may not get transmitted at all. The symptoms of MS occur in episodes or attacks. These attacks may last weeks to months. There may be long periods of nearly no problems between attacks. The age of onset of MS varies.  CAUSES The cause of MS is unknown. However, it is more common in the [place] than in the [place]. RISK FACTORS There is a higher incidence of MS in women than in men. MS is not an inherited illness, although your risk of MS is higher if you have a relative with MS. SIGNS AND SYMPTOMS  The symptoms of MS occur in episodes or attacks. These attacks may last weeks to months. There may be long periods of almost no symptoms between attacks. The symptoms of MS vary. This is because of the many different ways it affects the central nervous system. The main symptoms of MS include:  Vision problems and eye pain.  Numbness.  Weakness.  Paralysis in your arms, hands, feet, and legs (extremities).  Balance problems.  Tremors. DIAGNOSIS  Your health care provider can diagnose MS with the help of imaging exams and lab tests. These may include specialized X-ray exams and spinal fluid tests. The best imaging exam to confirm a diagnosis of MS is MRI. TREATMENT  There is no known cure for MS, but there are medicines that can decrease the number and frequency of attacks. Steroids are often used for short-term relief. Physical and  occupational therapy may also help. HOME CARE INSTRUCTIONS   Take medicines as directed by your health care provider.  Exercise as directed by your health care provider. SEEK MEDICAL CARE IF: You begin to feel depressed. SEEK IMMEDIATE MEDICAL CARE IF:  You develop paralysis.  You develop problems with bladder, bowel, or sexual function.  You develop mental changes, such as forgetfulness or mood swings.  You have a seizure. Document Released: 05/29/2000 Document Revised: 03/22/2013 Document Reviewed: 02/06/2013 [facility] Patient Information 2014 [place].

## 2013-07-23 NOTE — Progress Notes (Signed)
Family Medicine Teaching Service Daily Progress Note Intern Pager: [phone]  Patient name: ROSHAWN LACINA Medical record number: [id number] Date of birth: 01-24-1986 Age: 28 y.o. Gender: female  Primary Care Provider: Kennith Maes, DO Consultants: Cardiology Code Status: Full  Pt Overview and Major Events to Date:  2/7- admitted and started on IV steroids  Assessment and Plan: MIKI LABUDA is a 28 y.o. female presenting with acute upper and lower extremity pain, especially with movement. PMH is significant for Obesity and MS.   Acute exacerbation of MS - Patient has been treated previously with Copaxone, Dimethyl Fumerate, and Rituxan.  - Improved - Neuro consulted - appreciate their help in managing this patient.   - Continue gabapentin 600 TID, solumedrol 1 gram X 3 days (initially 5 days but has responded well enough for reduced duration) - No further workup or imaging indicated at this time.  - Oxycodone PRN for pain, will give 1 week Rx on dc - PT - OP PT needed   FEN/GI: SL IV. Regular diet  Prophylaxis: Lovenox   Disposition: Home today with [place] for 1 additional dose of IV solumedrol  Subjective: Feeling much better, reports good pain control with current pain medications.   Objective: Temp:  [98.1 F (36.7 C)-98.2 F (36.8 C)] 98.1 F (36.7 C) (02/08 0517) Pulse Rate:  [77-103] 81 (02/08 0517) Resp:  [16-18] 16 (02/08 0517) BP: (115-130)/(70-84) 115/70 mmHg (02/08 0517) SpO2:  [98 %] 98 % (02/08 0517) Physical Exam: General: resting in bed, appears in moderate distress secondary to pain.  HEENT: NCAT Cardiovascular: RRR. No murmur noted.  Respiratory: CTAB. No rales, rhonchi, or wheezing.  Abdomen: obese, soft, nontender, nondistended.  Extremities: No LE edema.  Skin: warm, dry, intact.  Neuro: AO x 3.  Muscle strength 5/5 in all extremities Normal Tone and Bulk. Sensation intact in all 4 extremities    Laboratory:  Recent Labs Lab 07/22/13 0203   WBC 9.8  HGB 11.8*  HCT 36.2  PLT 409*    Recent Labs Lab 07/22/13 0203  NA 139  K 5.1  CL 100  CO2 25  BUN 9  CREATININE 0.92  CALCIUM 9.1  PROT 7.2  BILITOT 0.3  ALKPHOS 53  ALT 10  AST 19  GLUCOSE 95    Timmothy Euler, MD 07/23/2013, 9:14 AM PGY-2, [place] Intern pager: [phone], text pages welcome

## 2013-07-23 NOTE — Progress Notes (Signed)
Subjective: Patient had no new complaints. Pain and weakness involving extremities has markedly improved. She's ambulating independently. He is having no apparent side effects from steroid treatment.  Objective: Current vital signs: BP 115/70  Pulse 81  Temp(Src) 98.1 F (36.7 C) (Oral)  Resp 16  Ht 5\' 4"  (1.626 m)  Wt 104.6 kg (230 lb 9.6 oz)  BMI 39.56 kg/m2  SpO2 98%  Neurologic Exam: Alert and in no acute distress. Mental status was normal. Extraocular movements were intact and normal. Patient had no facial weakness. Speech was normal. She moved extremities equally with no signs of focal weakness.  Medications: I have reviewed the patient's current medications.  Assessment/Plan: MS exacerbation with marked improvement following initial dose of Solu-Medrol IV, 1 g.  Recommend continuing course of IV Solu-Medrol 1 g per day, but limiting duration of treatment to 3 days she's had such unremarkable improvement.  No objection to discharging patient following steroid treatment today in continuing treatment by home infusion service.  C.R. Nicole Kindred, MD [facility] [phone]  07/23/2013  9:27 AM

## 2013-07-27 ENCOUNTER — Telehealth: Payer: Self-pay | Admitting: Family Medicine

## 2013-07-27 NOTE — Telephone Encounter (Signed)
I need the last number of the Phone # to call back.  Thanks, Gaspar Bidding

## 2013-07-27 NOTE — Telephone Encounter (Signed)
Pt was in hospital and received streiods by IV. She only got 2 days instead of 3. She has to go back to hospital to receive the 3 day of steriod. She has never received the call scheduling this, Varney Biles at [employer] for [facility] needs to [facility] what to do [phone] Please advise

## 2013-08-09 ENCOUNTER — Emergency Department (HOSPITAL_COMMUNITY)
Admission: EM | Admit: 2013-08-09 | Discharge: 2013-08-09 | Disposition: A | Discharge status: Home / Self Care | Payer: Medicaid Other | Attending: Emergency Medicine | Admitting: Emergency Medicine

## 2013-08-09 ENCOUNTER — Encounter (HOSPITAL_COMMUNITY): Payer: Self-pay | Admitting: Emergency Medicine

## 2013-08-09 DIAGNOSIS — Z87440 Personal history of urinary (tract) infections: Secondary | ICD-10-CM | POA: Insufficient documentation

## 2013-08-09 DIAGNOSIS — X58XXXA Exposure to other specified factors, initial encounter: Secondary | ICD-10-CM | POA: Insufficient documentation

## 2013-08-09 DIAGNOSIS — Z79899 Other long term (current) drug therapy: Secondary | ICD-10-CM | POA: Insufficient documentation

## 2013-08-09 DIAGNOSIS — Y929 Unspecified place or not applicable: Secondary | ICD-10-CM | POA: Insufficient documentation

## 2013-08-09 DIAGNOSIS — Z8669 Personal history of other diseases of the nervous system and sense organs: Secondary | ICD-10-CM | POA: Insufficient documentation

## 2013-08-09 DIAGNOSIS — Z8619 Personal history of other infectious and parasitic diseases: Secondary | ICD-10-CM | POA: Insufficient documentation

## 2013-08-09 DIAGNOSIS — Z862 Personal history of diseases of the blood and blood-forming organs and certain disorders involving the immune mechanism: Secondary | ICD-10-CM | POA: Insufficient documentation

## 2013-08-09 DIAGNOSIS — Y939 Activity, unspecified: Secondary | ICD-10-CM | POA: Insufficient documentation

## 2013-08-09 DIAGNOSIS — Z8742 Personal history of other diseases of the female genital tract: Secondary | ICD-10-CM | POA: Insufficient documentation

## 2013-08-09 DIAGNOSIS — Z23 Encounter for immunization: Secondary | ICD-10-CM | POA: Insufficient documentation

## 2013-08-09 DIAGNOSIS — S01319A Laceration without foreign body of unspecified ear, initial encounter: Secondary | ICD-10-CM

## 2013-08-09 DIAGNOSIS — E669 Obesity, unspecified: Secondary | ICD-10-CM | POA: Insufficient documentation

## 2013-08-09 DIAGNOSIS — S01309A Unspecified open wound of unspecified ear, initial encounter: Secondary | ICD-10-CM | POA: Insufficient documentation

## 2013-08-09 MED ORDER — TETANUS-DIPHTH-ACELL PERTUSSIS 5-2.5-18.5 LF-MCG/0.5 IM SUSP
0.5000 mL | Freq: Once | INTRAMUSCULAR | Status: AC
  Administered 2013-08-09: 0.5 mL via INTRAMUSCULAR
  Filled 2013-08-09: qty 0.5

## 2013-08-09 NOTE — ED Provider Notes (Signed)
Medical screening examination/treatment/procedure(s) were performed by non-physician practitioner and as supervising physician I was immediately available for consultation/collaboration.  EKG Interpretation   None         Delice Bison Danity Schmelzer, DO 08/09/13 1348

## 2013-08-09 NOTE — ED Notes (Signed)
Pt was wearing hoop earrings , fell, catching the earring, tearing ear lobe.

## 2013-08-09 NOTE — ED Notes (Signed)
Pt accidentally caught hoop earring and tore left ear lobe.

## 2013-08-09 NOTE — ED Provider Notes (Signed)
CSN: [id number]     Arrival date & time 08/09/13  6440 History  This chart was scribed for Irena Cords PA-C, a non-physician practitioner working with [place], DO by Denice Bors, ED Scribe. This patient was seen in room TR10C/TR10C and the patient's care was started at 9:43 AM        No chief complaint on file.    (Consider location/radiation/quality/duration/timing/severity/associated sxs/prior Treatment) The history is provided by the patient. No language interpreter was used.   HPI Comments: [name] is a 28 y.o. female who presents to the Emergency Department complaining of constant mild left ear pain onset last night. Pt reports hoop earring was caught in left ear. Reports associated mild bleeding of small superficial laceration. States bleeding was controlled with pressure dressing. Denies associated fever, and other injuries.Reports tetanus status is not up to date.   Past Medical History  Diagnosis Date  . Preterm labor   . Gonorrhea   . Abnormal Pap smear     Colpo>normal  . Urinary tract infection   . Ovarian cyst   . Neuromuscular disorder     diagnosed with MS this visit  . Multiple sclerosis   . Obesity   . Pernicious anemia 05/02/2013   Past Surgical History  Procedure Laterality Date  . Tubal ligation      2012  . Colposcopy     Family History  Problem Relation Age of Onset  . Anesthesia problems Neg Hx   . Other Neg Hx   . Diabetes Mother   . Hypertension Mother   . Diabetes Father   . Hypertension Father    History  Substance Use Topics  . Smoking status: Never Smoker   . Smokeless tobacco: Never Used  . Alcohol Use: No     Comment: none since February 01, 2013 (diagnosed with MS)    OB History   Grav Para Term Preterm Abortions TAB SAB Ect Mult Living   6 3 1 2 3 3   1 4      Review of Systems  Constitutional: Negative for fever.  Skin: Positive for wound.  Psychiatric/Behavioral: Negative for confusion.       Allergies  Review of patient's allergies indicates no known allergies.  Home Medications   Current Outpatient Rx  Name  Route  Sig  Dispense  Refill  . gabapentin (NEURONTIN) 600 MG tablet   Oral   Take 1 tablet (600 mg total) by mouth 3 (three) times daily.   90 tablet   0   . oxyCODONE-acetaminophen (PERCOCET) 10-325 MG per tablet   Oral   Take 1 tablet by mouth every 4 (four) hours as needed for pain.   30 tablet   0   . sodium chloride 0.9 % SOLN 50 mL with methylPREDNISolone sodium succinate 1000 MG SOLR 1,000 mg   Intravenous   Inject 1,000 mg into the vein daily.   1000 mg   0   . vitamin B-12 (CYANOCOBALAMIN) 1000 MCG tablet   Oral   Take 1 tablet (1,000 mcg total) by mouth daily.   90 tablet   3   . Vitamin D, Ergocalciferol, (DRISDOL) 50000 UNITS CAPS capsule      Take 1 capsule (50,000 Units total) by mouth once a week.         Marland Kitchen EXPIRED: zolpidem (AMBIEN CR) 6.25 MG CR tablet   Oral   Take 6.25 mg by mouth at bedtime as needed for sleep.  LMP 07/25/2013 Physical Exam  Nursing note and vitals reviewed. Constitutional: She is oriented to person, place, and time. She appears well-developed and well-nourished. No distress.  HENT:  Head: Normocephalic and atraumatic.  Eyes: EOM are normal.  Neck: Neck supple. No tracheal deviation present.  Cardiovascular: Normal rate.   Pulmonary/Chest: Effort normal. No respiratory distress.  Musculoskeletal: Normal range of motion.  Neurological: She is alert and oriented to person, place, and time.  Skin: Skin is warm and dry.  2 mm superficial laceration flanked by the tragus and the antitragus of the left ear   Psychiatric: She has a normal mood and affect. Her behavior is normal.    ED Course  Procedures  COORDINATION OF CARE:  Nursing notes reviewed. Vital signs reviewed. Initial pt interview and examination performed.   9:43 AM-Discussed work up plan with pt at bedside. Pt agrees  with plan.   Treatment plan initiated:Medications - No data to display   I personally performed the services described in this documentation, which was scribed in my presence. The recorded information has been reviewed and is accurate.   LACERATION REPAIR Performed by: Brent General Authorized by: Brent General Consent: Verbal consent obtained. Risks and benefits: risks, benefits and alternatives were discussed Consent given by: patient Patient identity confirmed: provided demographic data Prepped and Draped in normal sterile fashion Wound explored  Laceration Location: L antitragus   Laceration Length: 1.3cm  No Foreign Bodies seen or palpated  Anesthesia: local infiltration  NA  Anesthetic total: NA  Irrigation method: syringe Amount of cleaning: standard  Skin closure: Dermabond  Number of sutures: NA  Technique: Dermanbond  Patient tolerance: Patient tolerated the procedure well with no immediate complications.   Brent General, PA-C 08/09/13 1020

## 2013-08-09 NOTE — Discharge Instructions (Signed)
Return here as needed. Follow up with your doctor as needed. The dermabond will come off on its own.

## 2013-08-29 DIAGNOSIS — K909 Intestinal malabsorption, unspecified: Secondary | ICD-10-CM | POA: Insufficient documentation

## 2013-09-11 ENCOUNTER — Encounter (HOSPITAL_COMMUNITY): Payer: Self-pay | Admitting: Emergency Medicine

## 2013-09-11 ENCOUNTER — Emergency Department (HOSPITAL_COMMUNITY)
Admission: EM | Admit: 2013-09-11 | Discharge: 2013-09-11 | Discharge status: Against Medical Advice | Payer: Medicaid Other | Attending: Emergency Medicine | Admitting: Emergency Medicine

## 2013-09-11 DIAGNOSIS — R51 Headache: Secondary | ICD-10-CM | POA: Insufficient documentation

## 2013-09-11 DIAGNOSIS — E669 Obesity, unspecified: Secondary | ICD-10-CM | POA: Insufficient documentation

## 2013-09-11 DIAGNOSIS — R110 Nausea: Secondary | ICD-10-CM | POA: Insufficient documentation

## 2013-09-11 NOTE — ED Notes (Signed)
Per pt sts that she has been having HA x 5 days associated with nausea. sts hx of the same with her MS. sts nothing is helping OTC.

## 2013-09-11 NOTE — ED Notes (Signed)
Called for vital sign update,  No answer

## 2013-09-18 ENCOUNTER — Telehealth: Payer: Self-pay | Admitting: *Deleted

## 2013-09-18 NOTE — Telephone Encounter (Signed)
Colletta [place] calling from [facility].  Patient has an appt with today Dr. Simona Huh Ang (Rheumatology).  Due to patient having Medicaid, office requesting NPI #  to authorize appt.  NPI # given.  Nolene Ebbs, RN

## 2013-09-19 ENCOUNTER — Telehealth: Payer: Self-pay | Admitting: Family Medicine

## 2013-09-19 NOTE — Telephone Encounter (Signed)
Needs refill on percocet Please call when ready for pickupt

## 2013-09-19 NOTE — Telephone Encounter (Signed)
Discussed case with pt and pt would like percocet for her migraines which are not well controlled on Ibuprofen.  Discussed that we normally do not do this and I would not refill this today as she is going to a neurologist at [facility], where preventative medications can be used for this.  Pt agrees to this plan and has been on Tegretol in the past, attempted to get in touch with Dr. George Hugh (her neurologist at [facility]) to discuss the case.  Appreciate her taking the phone call and suggest IM B12 shots q month to make sure she is getting B12 as could be having some proprioception deficit from this even if low normal.  As well, pt told me she was taken off the Topamax by Dr. George Hugh, however, after discussion with her neurologist, this is not the case and that pt seems to be desiring percocet for her HA.  Will continue to discuss with her neurologist as this case progresses, pt is due for EGD with Bx to evaluate for Celiac Sprue in the next week.    Tamela Oddi Awanda Mink, DO of [facility] 09/19/2013, 5:05 PM

## 2013-10-04 ENCOUNTER — Encounter: Payer: Self-pay | Admitting: Family Medicine

## 2013-10-04 ENCOUNTER — Ambulatory Visit (INDEPENDENT_AMBULATORY_CARE_PROVIDER_SITE_OTHER): Payer: Medicaid Other | Admitting: Family Medicine

## 2013-10-04 VITALS — BP 109/57 | HR 63 | Temp 98.1°F | Ht 64.0 in | Wt 234.0 lb

## 2013-10-04 DIAGNOSIS — G35 Multiple sclerosis: Secondary | ICD-10-CM

## 2013-10-04 DIAGNOSIS — E559 Vitamin D deficiency, unspecified: Secondary | ICD-10-CM | POA: Insufficient documentation

## 2013-10-04 DIAGNOSIS — E538 Deficiency of other specified B group vitamins: Secondary | ICD-10-CM

## 2013-10-04 LAB — CBC WITH DIFFERENTIAL/PLATELET
BASOS PCT: 0 % (ref 0–1)
Basophils Absolute: 0 10*3/uL (ref 0.0–0.1)
Eosinophils Absolute: 0 10*3/uL (ref 0.0–0.7)
Eosinophils Relative: 1 % (ref 0–5)
HEMATOCRIT: 33.9 % — AB (ref 36.0–46.0)
Hemoglobin: 11.3 g/dL — ABNORMAL LOW (ref 12.0–15.0)
Lymphocytes Relative: 24 % (ref 12–46)
Lymphs Abs: 1 10*3/uL (ref 0.7–4.0)
MCH: 29.5 pg (ref 26.0–34.0)
MCHC: 33.3 g/dL (ref 30.0–36.0)
MCV: 88.5 fL (ref 78.0–100.0)
MONO ABS: 0.5 10*3/uL (ref 0.1–1.0)
MONOS PCT: 11 % (ref 3–12)
NEUTROS ABS: 2.7 10*3/uL (ref 1.7–7.7)
Neutrophils Relative %: 64 % (ref 43–77)
Platelets: 405 10*3/uL — ABNORMAL HIGH (ref 150–400)
RBC: 3.83 MIL/uL — ABNORMAL LOW (ref 3.87–5.11)
RDW: 13.1 % (ref 11.5–15.5)
WBC: 4.2 10*3/uL (ref 4.0–10.5)

## 2013-10-04 LAB — VITAMIN B12: Vitamin B-12: 389 pg/mL (ref 211–911)

## 2013-10-04 MED ORDER — VITAMIN D (ERGOCALCIFEROL) 1.25 MG (50000 UNIT) PO CAPS: 50000.0000 [IU] | ORAL_CAPSULE | ORAL | Status: DC

## 2013-10-04 NOTE — Patient Instructions (Signed)
[name], it was nice seeing you as usual.  Please let Dr. George Hugh know about our discussions today.  1) Vitamin B 12 - Level was 233 on 2/27. EGD did not show any malabsorption process or evidence of sprue.  B12 level checked at today's visit and you have been on 1,000 mcg of oral B12 daily.  If still low normal, will switch back to IM injections.  Not on medications that would interfere with B12 absorption.   2) Vitamin D Deficiency - Checked level today.  Started back on 50,000 IU every week and will recheck in 6-8 weeks.  If normal at that time, will be on 800 IU daily.    Thanks, Dr. Awanda Mink

## 2013-10-04 NOTE — Assessment & Plan Note (Addendum)
Pt being followed by [facility], Dr. George Hugh, greatly appreciate her recommendations.  Being tried on different medications with success, however, currently being tx with Ritaxumib.  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

## 2013-10-04 NOTE — Assessment & Plan Note (Signed)
Unknown etiology at this point.  Last B12 levels 233, which are equivocal, stating she has been on B12 PO since that point.  Will check B12 levels again today along with CBC to evaluate for anemia and macrocytosis.  If equivocal again today, will consider switching back to IM injections.  She is scheduled to see the neurologist again in about 2 weeks and will continue to communicate with her about her condition.  Pt's GI studies were negative for malabsorption process and not on any medications affecting B12 absorption at this time.

## 2013-10-04 NOTE — Progress Notes (Signed)
[name] is a 28 y.o. who presents today for physical and MS/B12 problems.  Vitamin D Deficiency - Pt ran out of medication for this about 1-2 months ago, has not been taking since then.  Last levels checked were 9.7 on 08/11/13.   MS/B12 deficiency - Scheduled to meet with neurologist at [facility] on May 2.  Last level checked was 233 on 08/11/13 and denies any paresthesias today.  Denies any blurred vision today.  Had EGD and Duodenal ABx performed last week by GI at [facility] which did not show any evidence of sprue or malabsorption.    Past Medical History  Diagnosis Date  . Preterm labor   . Gonorrhea   . Abnormal Pap smear     Colpo>normal  . Urinary tract infection   . Ovarian cyst   . Neuromuscular disorder     diagnosed with MS this visit  . Multiple sclerosis   . Obesity   . Pernicious anemia 05/02/2013    History   Social History  . Marital Status: Single    Spouse Name: N/A    Number of Children: 4  . Years of Education: 13   Occupational History  .      [employer]   Social History Main Topics  . Smoking status: Never Smoker   . Smokeless tobacco: Never Used  . Alcohol Use: No     Comment: none since February 01, 2013 (diagnosed with MS)   . Drug Use: No  . Sexual Activity: Yes    Birth Control/ Protection: Surgical   Other Topics Concern  . Not on file   Social History Narrative   Lives with 4 children. Good support group in her community including mother who lives close.     Family History  Problem Relation Age of Onset  . Anesthesia problems Neg Hx   . Other Neg Hx   . Diabetes Mother   . Hypertension Mother   . Diabetes Father   . Hypertension Father     Current Outpatient Prescriptions on File Prior to Visit  Medication Sig Dispense Refill  . gabapentin (NEURONTIN) 300 MG capsule Take 300 mg by mouth.      Marland Kitchen ibuprofen (ADVIL,MOTRIN) 200 MG tablet Take 200 mg by mouth.      . vitamin B-12 (CYANOCOBALAMIN) 1000 MCG tablet Take 1,000 mcg by  mouth.       No current facility-administered medications on file prior to visit.    Review of Symptoms Reviewed and negative except for HPI Physical Exam Filed Vitals:   10/04/13 0903  BP: 109/57  Pulse: 63  Temp: 98.1 F (36.7 C)    Gen: NAD, Well nourished, Well developed HEENT: PERRLA, EOMI, [place]/AT Neck: no JVD Cardio: RRR, No murmurs/gallops/rubs Lungs: CTA, no wheezes, rhonchi, crackles Abd: NABS, soft nontender nondistended MSK: ROM normal  Neuro: Neurological - no internuclear ophthalmoplegia, +3/4 DTR Patellar reflex B/L LE  Psych: AAO x 3

## 2013-10-04 NOTE — Assessment & Plan Note (Signed)
Check Levels today, restart Vit D 50,000 U x 6-8 weeks and f/u at that time.  May need to be on 800 IU after that.

## 2013-10-05 LAB — VITAMIN D 25 HYDROXY (VIT D DEFICIENCY, FRACTURES): VIT D 25 HYDROXY: 16 ng/mL — AB (ref 30–89)

## 2013-11-24 ENCOUNTER — Telehealth: Payer: Self-pay | Admitting: *Deleted

## 2013-11-24 NOTE — Telephone Encounter (Signed)
Margreta Journey from [facility] called to request NPI number.  Pt seen April 2015 for multiple scolaris.  NPI number given x 1 visit.  Derl Barrow, RN

## 2014-01-21 ENCOUNTER — Encounter (HOSPITAL_COMMUNITY): Payer: Self-pay | Admitting: Emergency Medicine

## 2014-01-21 ENCOUNTER — Emergency Department (HOSPITAL_COMMUNITY)
Admission: EM | Admit: 2014-01-21 | Discharge: 2014-01-21 | Disposition: A | Discharge status: Home / Self Care | Payer: Medicaid Other | Attending: Emergency Medicine | Admitting: Emergency Medicine

## 2014-01-21 DIAGNOSIS — G35 Multiple sclerosis: Secondary | ICD-10-CM | POA: Insufficient documentation

## 2014-01-21 DIAGNOSIS — Z79899 Other long term (current) drug therapy: Secondary | ICD-10-CM | POA: Diagnosis not present

## 2014-01-21 DIAGNOSIS — Z8742 Personal history of other diseases of the female genital tract: Secondary | ICD-10-CM | POA: Insufficient documentation

## 2014-01-21 DIAGNOSIS — Z87440 Personal history of urinary (tract) infections: Secondary | ICD-10-CM | POA: Insufficient documentation

## 2014-01-21 DIAGNOSIS — Z3202 Encounter for pregnancy test, result negative: Secondary | ICD-10-CM | POA: Diagnosis not present

## 2014-01-21 DIAGNOSIS — Z8619 Personal history of other infectious and parasitic diseases: Secondary | ICD-10-CM | POA: Diagnosis not present

## 2014-01-21 DIAGNOSIS — M542 Cervicalgia: Secondary | ICD-10-CM | POA: Diagnosis present

## 2014-01-21 DIAGNOSIS — E669 Obesity, unspecified: Secondary | ICD-10-CM | POA: Insufficient documentation

## 2014-01-21 DIAGNOSIS — Z862 Personal history of diseases of the blood and blood-forming organs and certain disorders involving the immune mechanism: Secondary | ICD-10-CM | POA: Diagnosis not present

## 2014-01-21 DIAGNOSIS — G709 Myoneural disorder, unspecified: Secondary | ICD-10-CM | POA: Insufficient documentation

## 2014-01-21 LAB — CBC WITH DIFFERENTIAL/PLATELET
Basophils Absolute: 0 10*3/uL (ref 0.0–0.1)
Basophils Relative: 0 % (ref 0–1)
Eosinophils Absolute: 0.1 10*3/uL (ref 0.0–0.7)
Eosinophils Relative: 1 % (ref 0–5)
HCT: 32.6 % — ABNORMAL LOW (ref 36.0–46.0)
HEMOGLOBIN: 10.5 g/dL — AB (ref 12.0–15.0)
LYMPHS ABS: 1.5 10*3/uL (ref 0.7–4.0)
LYMPHS PCT: 25 % (ref 12–46)
MCH: 30.3 pg (ref 26.0–34.0)
MCHC: 32.2 g/dL (ref 30.0–36.0)
MCV: 93.9 fL (ref 78.0–100.0)
MONOS PCT: 8 % (ref 3–12)
Monocytes Absolute: 0.5 10*3/uL (ref 0.1–1.0)
NEUTROS ABS: 4 10*3/uL (ref 1.7–7.7)
NEUTROS PCT: 66 % (ref 43–77)
Platelets: 335 10*3/uL (ref 150–400)
RBC: 3.47 MIL/uL — AB (ref 3.87–5.11)
RDW: 12.6 % (ref 11.5–15.5)
WBC: 6 10*3/uL (ref 4.0–10.5)

## 2014-01-21 LAB — COMPREHENSIVE METABOLIC PANEL
ALK PHOS: 45 U/L (ref 39–117)
ALT: 12 U/L (ref 0–35)
ANION GAP: 9 (ref 5–15)
AST: 19 U/L (ref 0–37)
Albumin: 3.4 g/dL — ABNORMAL LOW (ref 3.5–5.2)
BILIRUBIN TOTAL: 0.4 mg/dL (ref 0.3–1.2)
BUN: 8 mg/dL (ref 6–23)
CO2: 26 mEq/L (ref 19–32)
Calcium: 8.8 mg/dL (ref 8.4–10.5)
Chloride: 104 mEq/L (ref 96–112)
Creatinine, Ser: 0.84 mg/dL (ref 0.50–1.10)
GFR calc non Af Amer: >90 mL/min (ref >90)
GLUCOSE: 80 mg/dL (ref 70–99)
Potassium: 4.4 mEq/L (ref 3.7–5.3)
Sodium: 139 mEq/L (ref 137–147)
Total Protein: 6.6 g/dL (ref 6.0–8.3)

## 2014-01-21 LAB — URINALYSIS, ROUTINE W REFLEX MICROSCOPIC
Bilirubin Urine: NEGATIVE
Glucose, UA: NEGATIVE mg/dL
Hgb urine dipstick: NEGATIVE
Ketones, ur: NEGATIVE mg/dL
LEUKOCYTES UA: NEGATIVE
NITRITE: NEGATIVE
PH: 7.5 (ref 5.0–8.0)
Protein, ur: NEGATIVE mg/dL
SPECIFIC GRAVITY, URINE: 1.018 (ref 1.005–1.030)
Urobilinogen, UA: 0.2 mg/dL (ref 0.0–1.0)

## 2014-01-21 LAB — PREGNANCY, URINE: Preg Test, Ur: NEGATIVE

## 2014-01-21 MED ORDER — METHYLPREDNISOLONE SODIUM SUCC 125 MG IJ SOLR
125.0000 mg | Freq: Once | INTRAMUSCULAR | Status: AC
  Administered 2014-01-21: 125 mg via INTRAVENOUS
  Filled 2014-01-21: qty 2

## 2014-01-21 MED ORDER — SODIUM CHLORIDE 0.9 % IV SOLN
Freq: Once | INTRAVENOUS | Status: AC
  Administered 2014-01-21: 12:00:00 via INTRAVENOUS

## 2014-01-21 NOTE — ED Provider Notes (Signed)
CSN: [id number]     Arrival date & time 01/21/14  1019 History   First MD Initiated Contact with Patient 01/21/14 1114     Chief Complaint  Patient presents with  . Neck Pain  . Numbness     (Consider location/radiation/quality/duration/timing/severity/associated sxs/prior Treatment) Patient is a 28 y.o. female presenting with neck pain. The history is provided by the patient. No language interpreter was used.  Neck Pain Pain location:  Generalized neck Quality:  Aching Pain radiates to:  L arm and R arm Pain severity:  Moderate Pain is:  Same all the time Onset quality:  Gradual Timing:  Constant Progression:  Worsening Chronicity:  New Relieved by:  Nothing Worsened by:  Nothing tried Ineffective treatments:  None tried Associated symptoms: paresis, tingling and weakness   Risk factors: no hx of spinal trauma and no recent head injury   Pt has a history of MS.   Pt reports this feels like previous exacerbations.   Pt reports she was hospitalized by her MD last time symptoms were this bad.   Pt is on oral prednisone.    Past Medical History  Diagnosis Date  . Preterm labor   . Gonorrhea   . Abnormal Pap smear     Colpo>normal  . Urinary tract infection   . Ovarian cyst   . Neuromuscular disorder     diagnosed with MS this visit  . Multiple sclerosis   . Obesity   . Pernicious anemia 05/02/2013   Past Surgical History  Procedure Laterality Date  . Tubal ligation      2012  . Colposcopy     Family History  Problem Relation Age of Onset  . Anesthesia problems Neg Hx   . Other Neg Hx   . Diabetes Mother   . Hypertension Mother   . Diabetes Father   . Hypertension Father    History  Substance Use Topics  . Smoking status: Never Smoker   . Smokeless tobacco: Never Used  . Alcohol Use: No     Comment: none since February 01, 2013 (diagnosed with MS)    OB History   Grav Para Term Preterm Abortions TAB SAB Ect Mult Living   6 3 1 2 3 3   1 4      Review of  Systems  Musculoskeletal: Positive for neck pain.  Neurological: Positive for tingling and weakness.  All other systems reviewed and are negative.     Allergies  Review of patient's allergies indicates no known allergies.  Home Medications   Prior to Admission medications   Medication Sig Start Date End Date Taking? Authorizing Provider  gabapentin (NEURONTIN) 300 MG capsule Take 300 mg by mouth 3 (three) times daily.    Yes Historical Provider, MD  ibuprofen (ADVIL,MOTRIN) 200 MG tablet Take 800 mg by mouth every 8 (eight) hours as needed for moderate pain.    Yes Historical Provider, MD  SUMAtriptan (IMITREX) 50 MG tablet Take 50 mg by mouth every 2 (two) hours as needed for migraine.  09/13/13 09/13/14 Yes Historical Provider, MD  topiramate (TOPAMAX SPRINKLE) 25 MG capsule Take 25 mg by mouth at bedtime.  09/13/13  Yes Historical Provider, MD   BP 125/69  Pulse 61  Temp(Src) 98.5 F (36.9 C)  Resp 17  SpO2 99%  LMP 12/22/2013 Physical Exam  Nursing note and vitals reviewed. Constitutional: She appears well-developed and well-nourished.  HENT:  Head: Normocephalic and atraumatic.  Right Ear: External ear normal.  Left Ear:  External ear normal.  Mouth/Throat: Oropharynx is clear and moist.  Eyes: Conjunctivae and EOM are normal. Pupils are equal, round, and reactive to light.  Neck: Normal range of motion. Neck supple.  Cardiovascular: Normal rate and normal heart sounds.   Pulmonary/Chest: Effort normal.  Abdominal: Soft. Bowel sounds are normal.  Musculoskeletal: Normal range of motion.  Neurological: She is alert.  Skin: Skin is warm.  Psychiatric: She has a normal mood and affect.    ED Course  Procedures (including critical care time) Labs Review Labs Reviewed  URINALYSIS, ROUTINE W REFLEX MICROSCOPIC  PREGNANCY, URINE  CBC WITH DIFFERENTIAL  COMPREHENSIVE METABOLIC PANEL    Imaging Review No results found.   EKG Interpretation None      MDM  Pt given  Iv solumedrol.   Pt advised to call her MD At [facility] tomorrow.  Pt advised to continue steroid dose tomorrow.  Pt agrees to return if symptoms worsen or cahnge.   Final diagnoses:  MS (multiple sclerosis)        Fransico Meadow, PA-C 01/21/14 1420

## 2014-01-21 NOTE — ED Notes (Signed)
Per pt sts that she is having electric shock feeling on her right side. sts that also some left hand numbness. sts she was started on a steroid taper but getting worse. Hx of MS.

## 2014-01-21 NOTE — ED Provider Notes (Signed)
Medical screening examination/treatment/procedure(s) were conducted as a shared visit with non-physician practitioner(s) and myself.  I personally evaluated the patient during the encounter.   EKG Interpretation None      MS flare.  IV steroids.  Patient would prefer to go home and continue her oral steroids at this time.  She understands to call her neurologist for followup or return to the ER for worsening symptoms  Hoy Morn, MD 01/21/14 1427

## 2014-01-21 NOTE — Discharge Instructions (Signed)

## 2014-03-12 ENCOUNTER — Telehealth: Payer: Self-pay | Admitting: Family Medicine

## 2014-03-12 NOTE — Telephone Encounter (Signed)
NPI given for one Ryan

## 2014-04-16 ENCOUNTER — Encounter (HOSPITAL_COMMUNITY): Payer: Self-pay | Admitting: Emergency Medicine

## 2014-06-22 ENCOUNTER — Encounter (HOSPITAL_COMMUNITY): Payer: Self-pay | Admitting: *Deleted

## 2014-06-22 ENCOUNTER — Emergency Department (HOSPITAL_COMMUNITY)
Admission: EM | Admit: 2014-06-22 | Discharge: 2014-06-22 | Disposition: A | Discharge status: Home / Self Care | Payer: Medicaid Other | Attending: Emergency Medicine | Admitting: Emergency Medicine

## 2014-06-22 DIAGNOSIS — Z3202 Encounter for pregnancy test, result negative: Secondary | ICD-10-CM | POA: Insufficient documentation

## 2014-06-22 DIAGNOSIS — Z862 Personal history of diseases of the blood and blood-forming organs and certain disorders involving the immune mechanism: Secondary | ICD-10-CM | POA: Diagnosis not present

## 2014-06-22 DIAGNOSIS — H9209 Otalgia, unspecified ear: Secondary | ICD-10-CM | POA: Insufficient documentation

## 2014-06-22 DIAGNOSIS — R253 Fasciculation: Secondary | ICD-10-CM | POA: Insufficient documentation

## 2014-06-22 DIAGNOSIS — Z791 Long term (current) use of non-steroidal anti-inflammatories (NSAID): Secondary | ICD-10-CM | POA: Diagnosis not present

## 2014-06-22 DIAGNOSIS — Z87440 Personal history of urinary (tract) infections: Secondary | ICD-10-CM | POA: Diagnosis not present

## 2014-06-22 DIAGNOSIS — Z8619 Personal history of other infectious and parasitic diseases: Secondary | ICD-10-CM | POA: Diagnosis not present

## 2014-06-22 DIAGNOSIS — E669 Obesity, unspecified: Secondary | ICD-10-CM | POA: Insufficient documentation

## 2014-06-22 DIAGNOSIS — G35 Multiple sclerosis: Secondary | ICD-10-CM | POA: Diagnosis not present

## 2014-06-22 DIAGNOSIS — H5710 Ocular pain, unspecified eye: Secondary | ICD-10-CM | POA: Diagnosis present

## 2014-06-22 DIAGNOSIS — Z79899 Other long term (current) drug therapy: Secondary | ICD-10-CM | POA: Insufficient documentation

## 2014-06-22 DIAGNOSIS — Z8742 Personal history of other diseases of the female genital tract: Secondary | ICD-10-CM | POA: Diagnosis not present

## 2014-06-22 LAB — BASIC METABOLIC PANEL
Anion gap: 10 (ref 5–15)
BUN: 11 mg/dL (ref 6–23)
CHLORIDE: 101 meq/L (ref 96–112)
CO2: 23 mmol/L (ref 19–32)
CREATININE: 0.88 mg/dL (ref 0.50–1.10)
Calcium: 9.8 mg/dL (ref 8.4–10.5)
GFR calc Af Amer: >90 mL/min (ref >90)
GFR, EST NON AFRICAN AMERICAN: 87 mL/min — AB (ref >90)
Glucose, Bld: 73 mg/dL (ref 70–99)
POTASSIUM: 4.1 mmol/L (ref 3.5–5.1)
SODIUM: 134 mmol/L — AB (ref 135–145)

## 2014-06-22 LAB — URINE MICROSCOPIC-ADD ON

## 2014-06-22 LAB — CBC WITH DIFFERENTIAL/PLATELET
Basophils Absolute: 0 10*3/uL (ref 0.0–0.1)
Basophils Relative: 0 % (ref 0–1)
Eosinophils Absolute: 0.1 10*3/uL (ref 0.0–0.7)
Eosinophils Relative: 1 % (ref 0–5)
HCT: 36.7 % (ref 36.0–46.0)
HEMOGLOBIN: 11.7 g/dL — AB (ref 12.0–15.0)
LYMPHS ABS: 2 10*3/uL (ref 0.7–4.0)
Lymphocytes Relative: 27 % (ref 12–46)
MCH: 29.5 pg (ref 26.0–34.0)
MCHC: 31.9 g/dL (ref 30.0–36.0)
MCV: 92.7 fL (ref 78.0–100.0)
Monocytes Absolute: 0.5 10*3/uL (ref 0.1–1.0)
Monocytes Relative: 7 % (ref 3–12)
NEUTROS ABS: 4.7 10*3/uL (ref 1.7–7.7)
NEUTROS PCT: 65 % (ref 43–77)
Platelets: 374 10*3/uL (ref 150–400)
RBC: 3.96 MIL/uL (ref 3.87–5.11)
RDW: 12.1 % (ref 11.5–15.5)
WBC: 7.3 10*3/uL (ref 4.0–10.5)

## 2014-06-22 LAB — PREGNANCY, URINE: Preg Test, Ur: NEGATIVE

## 2014-06-22 LAB — URINALYSIS, ROUTINE W REFLEX MICROSCOPIC
Bilirubin Urine: NEGATIVE
GLUCOSE, UA: NEGATIVE mg/dL
Ketones, ur: NEGATIVE mg/dL
Leukocytes, UA: NEGATIVE
NITRITE: NEGATIVE
PROTEIN: NEGATIVE mg/dL
Specific Gravity, Urine: 1.013 (ref 1.005–1.030)
Urobilinogen, UA: 0.2 mg/dL (ref 0.0–1.0)
pH: 6 (ref 5.0–8.0)

## 2014-06-22 MED ORDER — METHYLPREDNISOLONE SODIUM SUCC 125 MG IJ SOLR
125.0000 mg | Freq: Once | INTRAMUSCULAR | Status: AC
  Administered 2014-06-22: 125 mg via INTRAVENOUS
  Filled 2014-06-22: qty 2

## 2014-06-22 NOTE — Discharge Instructions (Signed)
Follow up with your doctor at [facility] as discussed Multiple Sclerosis Multiple sclerosis (MS) is a disease of the central nervous system. It leads to the loss of the insulating covering of the nerves (myelin sheath) of your brain. When this happens, brain signals do not get sent properly or may not get sent at all. The age of onset of MS varies.  CAUSES The cause of MS is unknown. However, it is more common in the [place] than in the [place]. RISK FACTORS There is a higher number of women with MS than men. MS is not an illness that is passed down to you from your family members (inherited). However, your risk of MS is higher if you have a relative with MS. SIGNS AND SYMPTOMS  The symptoms of MS occur in episodes or attacks. These attacks may last weeks to months. There may be long periods of almost no symptoms between attacks. The symptoms of MS vary. This is because of the many different ways it affects the central nervous system. The main symptoms of MS include:  Vision problems and eye pain.  Numbness.  Weakness.  Inability to move your arms, hands, feet, or legs (paralysis).  Balance problems.  Tremors. DIAGNOSIS  Your health care provider can diagnose MS with the help of imaging exams and lab tests. These may include specialized X-ray exams and spinal fluid tests. The best imaging exam to confirm a diagnosis of MS is an MRI. TREATMENT  There is no known cure for MS, but there are medicines that can decrease the number and frequency of attacks. Steroids are often used for short-term relief. Physical and occupational therapy may also help. There are also many new alternative or complementary treatments available to help control the symptoms of MS. Ask your health care provider if any of these other options are right for you. HOME CARE INSTRUCTIONS   Take medicines as directed by your health care provider.  Exercise as directed by your health care  provider. SEEK MEDICAL CARE IF: You begin to feel depressed. SEEK IMMEDIATE MEDICAL CARE IF:  You develop paralysis.  You have problems with bladder, bowel, or sexual function.  You develop mental changes, such as forgetfulness or mood swings.  You have a period of uncontrolled movements (seizure). Document Released: 05/29/2000 Document Revised: 06/06/2013 Document Reviewed: 02/06/2013 [facility] Patient Information 2015 [place], [place]. This information is not intended to replace advice given to you by your health care provider. Make sure you discuss any questions you have with your health care provider.

## 2014-06-22 NOTE — ED Provider Notes (Signed)
CSN: [id number]     Arrival date & time 06/22/14  1348 History   First MD Initiated Contact with Patient 06/22/14 1550     Chief Complaint  Patient presents with  . Spasms  . Otalgia  . Eye Pain     (Consider location/radiation/quality/duration/timing/severity/associated sxs/prior Treatment) HPI Comments: Pt comes in with complaints of generalized burning and pain. Pt states that she has ms and she feels like she is having flare . Denies fever, vomiting, diarrhea. She states that her doctor took her off her ms medications 3 months ago and she has been doing fine.she states that has also had twitching.  The history is provided by the patient. No language interpreter was used.    Past Medical History  Diagnosis Date  . Preterm labor   . Gonorrhea   . Abnormal Pap smear     Colpo>normal  . Urinary tract infection   . Ovarian cyst   . Neuromuscular disorder     diagnosed with MS this visit  . Multiple sclerosis   . Obesity   . Pernicious anemia 05/02/2013   Past Surgical History  Procedure Laterality Date  . Tubal ligation      2012  . Colposcopy     Family History  Problem Relation Age of Onset  . Anesthesia problems Neg Hx   . Other Neg Hx   . Diabetes Mother   . Hypertension Mother   . Diabetes Father   . Hypertension Father    History  Substance Use Topics  . Smoking status: Never Smoker   . Smokeless tobacco: Never Used  . Alcohol Use: No     Comment: none since February 01, 2013 (diagnosed with MS)    OB History    Gravida Para Term Preterm AB TAB SAB Ectopic Multiple Living   6 3 1 2 3 3   1 4      Review of Systems  All other systems reviewed and are negative.     Allergies  Review of patient's allergies indicates no known allergies.  Home Medications   Prior to Admission medications   Medication Sig Start Date End Date Taking? Authorizing Provider  gabapentin (NEURONTIN) 300 MG capsule Take 300 mg by mouth 3 (three) times daily.     Historical  Provider, MD  ibuprofen (ADVIL,MOTRIN) 200 MG tablet Take 800 mg by mouth every 8 (eight) hours as needed for moderate pain.     Historical Provider, MD  SUMAtriptan (IMITREX) 50 MG tablet Take 50 mg by mouth every 2 (two) hours as needed for migraine.  09/13/13 09/13/14  Historical Provider, MD  topiramate (TOPAMAX SPRINKLE) 25 MG capsule Take 25 mg by mouth at bedtime.  09/13/13   Historical Provider, MD   BP 125/83 mmHg  Pulse 71  Temp(Src) 98.3 F (36.8 C) (Oral)  Resp 20  Ht 5\' 4"  (1.626 m)  Wt 235 lb (106.595 kg)  BMI 40.32 kg/m2  SpO2 100% Physical Exam  Constitutional: She is oriented to person, place, and time. She appears well-developed and well-nourished.  HENT:  Head: Normocephalic and atraumatic.  Cardiovascular: Normal rate and regular rhythm.   Pulmonary/Chest: Effort normal and breath sounds normal.  Abdominal: Soft. Bowel sounds are normal. There is no tenderness.  Musculoskeletal: Normal range of motion.  Neurological: She is alert and oriented to person, place, and time. She exhibits normal muscle tone. Coordination normal.  Skin: Skin is warm and dry.  Psychiatric: She has a normal mood and affect.  Nursing  note and vitals reviewed.   ED Course  Procedures (including critical care time) Labs Review Labs Reviewed  CBC WITH DIFFERENTIAL - Abnormal; Notable for the following:    Hemoglobin 11.7 (*)    All other components within normal limits  BASIC METABOLIC PANEL - Abnormal; Notable for the following:    Sodium 134 (*)    GFR calc non Af Amer 87 (*)    All other components within normal limits  URINALYSIS, ROUTINE W REFLEX MICROSCOPIC - Abnormal; Notable for the following:    Hgb urine dipstick MODERATE (*)    All other components within normal limits  PREGNANCY, URINE  URINE MICROSCOPIC-ADD ON    Imaging Review No results found.   EKG Interpretation None      MDM   Final diagnoses:  MS (multiple sclerosis)    Pt given dose of solumedrol here  and instructed on follow up with her doctors at [facility], NP 06/22/14 [place]. Alvino Chapel, MD 06/23/14 1497

## 2014-06-22 NOTE — ED Notes (Signed)
Patient has hx of MS.  She states she noticed burning in her eyes and ears.   She is burning all over.  Patient states she has noticed twitching as well.  Patient reports this is how she knows her MS is acting up and she needs steriods.  Patient is seen by neuro in [place]

## 2014-07-16 ENCOUNTER — Inpatient Hospital Stay (HOSPITAL_COMMUNITY)
Admission: AD | Admit: 2014-07-16 | Discharge: 2014-07-16 | Disposition: A | Discharge status: Home / Self Care | Payer: Medicaid Other | Source: Ambulatory Visit | Attending: Obstetrics and Gynecology | Admitting: Obstetrics and Gynecology

## 2014-07-16 ENCOUNTER — Encounter (HOSPITAL_COMMUNITY): Payer: Self-pay

## 2014-07-16 DIAGNOSIS — A499 Bacterial infection, unspecified: Secondary | ICD-10-CM

## 2014-07-16 DIAGNOSIS — N760 Acute vaginitis: Secondary | ICD-10-CM

## 2014-07-16 DIAGNOSIS — B9689 Other specified bacterial agents as the cause of diseases classified elsewhere: Secondary | ICD-10-CM | POA: Diagnosis not present

## 2014-07-16 DIAGNOSIS — R1011 Right upper quadrant pain: Secondary | ICD-10-CM | POA: Insufficient documentation

## 2014-07-16 DIAGNOSIS — R109 Unspecified abdominal pain: Secondary | ICD-10-CM | POA: Diagnosis present

## 2014-07-16 LAB — URINE MICROSCOPIC-ADD ON

## 2014-07-16 LAB — URINALYSIS, ROUTINE W REFLEX MICROSCOPIC
BILIRUBIN URINE: NEGATIVE
Glucose, UA: NEGATIVE mg/dL
Ketones, ur: NEGATIVE mg/dL
LEUKOCYTES UA: NEGATIVE
NITRITE: NEGATIVE
Protein, ur: NEGATIVE mg/dL
Specific Gravity, Urine: 1.025 (ref 1.005–1.030)
UROBILINOGEN UA: 0.2 mg/dL (ref 0.0–1.0)
pH: 6 (ref 5.0–8.0)

## 2014-07-16 LAB — COMPREHENSIVE METABOLIC PANEL
ALK PHOS: 52 U/L (ref 39–117)
ALT: 15 U/L (ref 0–35)
AST: 19 U/L (ref 0–37)
Albumin: 3.9 g/dL (ref 3.5–5.2)
Anion gap: 4 — ABNORMAL LOW (ref 5–15)
BUN: 10 mg/dL (ref 6–23)
CO2: 29 mmol/L (ref 19–32)
CREATININE: 0.75 mg/dL (ref 0.50–1.10)
Calcium: 9.1 mg/dL (ref 8.4–10.5)
Chloride: 103 mmol/L (ref 96–112)
Glucose, Bld: 85 mg/dL (ref 70–99)
Potassium: 3.7 mmol/L (ref 3.5–5.1)
SODIUM: 136 mmol/L (ref 135–145)
Total Bilirubin: 0.6 mg/dL (ref 0.3–1.2)
Total Protein: 7.5 g/dL (ref 6.0–8.3)

## 2014-07-16 LAB — CBC WITH DIFFERENTIAL/PLATELET
Basophils Absolute: 0 10*3/uL (ref 0.0–0.1)
Basophils Relative: 1 % (ref 0–1)
EOS ABS: 0.1 10*3/uL (ref 0.0–0.7)
Eosinophils Relative: 1 % (ref 0–5)
HCT: 34.6 % — ABNORMAL LOW (ref 36.0–46.0)
Hemoglobin: 11.3 g/dL — ABNORMAL LOW (ref 12.0–15.0)
LYMPHS ABS: 1.9 10*3/uL (ref 0.7–4.0)
Lymphocytes Relative: 38 % (ref 12–46)
MCH: 30.5 pg (ref 26.0–34.0)
MCHC: 32.7 g/dL (ref 30.0–36.0)
MCV: 93.3 fL (ref 78.0–100.0)
MONOS PCT: 9 % (ref 3–12)
Monocytes Absolute: 0.5 10*3/uL (ref 0.1–1.0)
NEUTROS PCT: 51 % (ref 43–77)
Neutro Abs: 2.5 10*3/uL (ref 1.7–7.7)
PLATELETS: 406 10*3/uL — AB (ref 150–400)
RBC: 3.71 MIL/uL — ABNORMAL LOW (ref 3.87–5.11)
RDW: 12.1 % (ref 11.5–15.5)
WBC: 4.9 10*3/uL (ref 4.0–10.5)

## 2014-07-16 LAB — LIPASE, BLOOD: Lipase: 29 U/L (ref 11–59)

## 2014-07-16 LAB — WET PREP, GENITAL
Trich, Wet Prep: NONE SEEN
Yeast Wet Prep HPF POC: NONE SEEN

## 2014-07-16 LAB — POCT PREGNANCY, URINE: Preg Test, Ur: NEGATIVE

## 2014-07-16 MED ORDER — METRONIDAZOLE 500 MG PO TABS: 500.0000 mg | ORAL_TABLET | Freq: Two times a day (BID) | ORAL | Status: DC

## 2014-07-16 MED ORDER — IBUPROFEN 600 MG PO TABS: 600.0000 mg | ORAL_TABLET | Freq: Four times a day (QID) | ORAL | Status: DC | PRN

## 2014-07-16 MED ORDER — KETOROLAC TROMETHAMINE 60 MG/2ML IM SOLN
60.0000 mg | Freq: Once | INTRAMUSCULAR | Status: AC
  Administered 2014-07-16: 60 mg via INTRAMUSCULAR
  Filled 2014-07-16: qty 2

## 2014-07-16 NOTE — MAU Provider Note (Signed)
History     CSN: [id number]  Arrival date and time: 07/16/14 1101   First Provider Initiated Contact with Patient 07/16/14 1200      Chief Complaint  Patient presents with  . Abdominal Pain   HPI   [name] is a 29 year old [phone] with PMHx of MS, gallbladder sludge and ovarian cyst presents today with a 2 week history of RUQ and lower abdominal pain. The pain in the RUQ has occurred sporadically with no obvious triggers. She describes it as sharp and the pain lasts a few seconds before disappearing. There is no tenderness or discomfort of the RUQ between the episodes. Pain is not associated with eating. She reports that the lower abdominal pain is not similar to the RUQ pain. The lower abdominal pain has been constant over the past two weeks. It is cramping and "twisting". The pain is intensified with intercourse and insertion of tampons. LMP was 1/28 and she has BTL for contraception; 3 years ago.  She denies nausea, vomiting, changes in vaginal discharge color, volume or odor, dysuria or frank hematuria. Reports one episode of constipation last week with a painful bowel movement and a normal BM yesterday.   OB History    Gravida Para Term Preterm AB TAB SAB Ectopic Multiple Living   '6 3 1 2 3 3   1 4      '$ Past Medical History  Diagnosis Date  . Preterm labor   . Gonorrhea   . Abnormal Pap smear     Colpo>normal  . Urinary tract infection   . Ovarian cyst   . Neuromuscular disorder     diagnosed with MS this visit  . Multiple sclerosis   . Obesity   . Pernicious anemia 05/02/2013    Past Surgical History  Procedure Laterality Date  . Tubal ligation      2012  . Colposcopy      Family History  Problem Relation Age of Onset  . Anesthesia problems Neg Hx   . Other Neg Hx   . Diabetes Mother   . Hypertension Mother   . Diabetes Father   . Hypertension Father     History  Substance Use Topics  . Smoking status: Never Smoker   . Smokeless tobacco: Never  Used  . Alcohol Use: No     Comment: none since February 01, 2013 (diagnosed with MS)     Allergies: No Known Allergies  Prescriptions prior to admission  Medication Sig Dispense Refill Last Dose  . gabapentin (NEURONTIN) 300 MG capsule Take 300 mg by mouth 3 (three) times daily.    07/15/2014 at Unknown time  . ibuprofen (ADVIL,MOTRIN) 200 MG tablet Take 800 mg by mouth every 8 (eight) hours as needed for moderate pain.    07/15/2014 at Unknown time  . SUMAtriptan (IMITREX) 50 MG tablet Take 50 mg by mouth every 2 (two) hours as needed for migraine.    07/15/2014 at Unknown time  . topiramate (TOPAMAX SPRINKLE) 25 MG capsule Take 25 mg by mouth at bedtime.    07/15/2014 at Unknown time   Results for orders placed or performed during the hospital encounter of 07/16/14 (from the past 48 hour(s))  Urinalysis, Routine w reflex microscopic     Status: Abnormal   Collection Time: 07/16/14 11:18 AM  Result Value Ref Range   Color, Urine YELLOW YELLOW   APPearance CLEAR CLEAR   Specific Gravity, Urine 1.025 1.005 - 1.030   pH 6.0 5.0 -  8.0   Glucose, UA NEGATIVE NEGATIVE mg/dL   Hgb urine dipstick MODERATE (A) NEGATIVE   Bilirubin Urine NEGATIVE NEGATIVE   Ketones, ur NEGATIVE NEGATIVE mg/dL   Protein, ur NEGATIVE NEGATIVE mg/dL   Urobilinogen, UA 0.2 0.0 - 1.0 mg/dL   Nitrite NEGATIVE NEGATIVE   Leukocytes, UA NEGATIVE NEGATIVE  Pregnancy, urine POC     Status: None   Collection Time: 07/16/14 11:18 AM  Result Value Ref Range   Preg Test, Ur NEGATIVE NEGATIVE    Comment:        THE SENSITIVITY OF THIS METHODOLOGY IS >24 mIU/mL   Urine microscopic-add on     Status: Abnormal   Collection Time: 07/16/14 11:18 AM  Result Value Ref Range   Squamous Epithelial / LPF FEW (A) RARE   WBC, UA 0-2 <3 WBC/hpf   RBC / HPF 11-20 <3 RBC/hpf   Bacteria, UA FEW (A) RARE  CBC with Differential/Platelet     Status: Abnormal   Collection Time: 07/16/14 12:14 PM  Result Value Ref Range   WBC 4.9 4.0  - 10.5 K/uL   RBC 3.71 (L) 3.87 - 5.11 MIL/uL   Hemoglobin 11.3 (L) 12.0 - 15.0 g/dL   HCT 34.6 (L) 36.0 - 46.0 %   MCV 93.3 78.0 - 100.0 fL   MCH 30.5 26.0 - 34.0 pg   MCHC 32.7 30.0 - 36.0 g/dL   RDW 12.1 11.5 - 15.5 %   Platelets 406 (H) 150 - 400 K/uL   Neutrophils Relative % 51 43 - 77 %   Neutro Abs 2.5 1.7 - 7.7 K/uL   Lymphocytes Relative 38 12 - 46 %   Lymphs Abs 1.9 0.7 - 4.0 K/uL   Monocytes Relative 9 3 - 12 %   Monocytes Absolute 0.5 0.1 - 1.0 K/uL   Eosinophils Relative 1 0 - 5 %   Eosinophils Absolute 0.1 0.0 - 0.7 K/uL   Basophils Relative 1 0 - 1 %   Basophils Absolute 0.0 0.0 - 0.1 K/uL  Comprehensive metabolic panel     Status: Abnormal   Collection Time: 07/16/14 12:45 PM  Result Value Ref Range   Sodium 136 135 - 145 mmol/L   Potassium 3.7 3.5 - 5.1 mmol/L   Chloride 103 96 - 112 mmol/L   CO2 29 19 - 32 mmol/L   Glucose, Bld 85 70 - 99 mg/dL   BUN 10 6 - 23 mg/dL   Creatinine, Ser 0.75 0.50 - 1.10 mg/dL   Calcium 9.1 8.4 - 10.5 mg/dL   Total Protein 7.5 6.0 - 8.3 g/dL   Albumin 3.9 3.5 - 5.2 g/dL   AST 19 0 - 37 U/L   ALT 15 0 - 35 U/L   Alkaline Phosphatase 52 39 - 117 U/L   Total Bilirubin 0.6 0.3 - 1.2 mg/dL   GFR calc non Af Amer >90 >90 mL/min   GFR calc Af Amer >90 >90 mL/min    Comment: (NOTE) The eGFR has been calculated using the CKD EPI equation. This calculation has not been validated in all clinical situations. eGFR's persistently <90 mL/min signify possible Chronic Kidney Disease.    Anion gap 4 (L) 5 - 15  Wet prep, genital     Status: Abnormal   Collection Time: 07/16/14  1:25 PM  Result Value Ref Range   Yeast Wet Prep HPF POC NONE SEEN NONE SEEN   Trich, Wet Prep NONE SEEN NONE SEEN   Clue Cells Wet Prep HPF  POC FEW (A) NONE SEEN   WBC, Wet Prep HPF POC FEW (A) NONE SEEN    Comment: MODERATE BACTERIA SEEN    Review of Systems  Constitutional: Negative for fever and chills.  Gastrointestinal: Positive for abdominal pain  (Suprapubic and RUQ) and constipation (One episode of constipation 2 days ago; resolved). Negative for nausea, vomiting and diarrhea.  Genitourinary: Negative for dysuria and hematuria.   Physical Exam   Blood pressure 135/84, pulse 82, temperature 98.4 F (36.9 C), temperature source Oral, resp. rate 18, height $RemoveBe'5\' 4"'[id number]$  (1.626 m), weight 108.863 kg (240 lb), last menstrual period 07/02/2014.  Physical Exam  Constitutional: She appears well-developed and well-nourished.  Cardiovascular: Normal rate.   Respiratory: Effort normal.  GI: Soft. Bowel sounds are normal. She exhibits no distension and no mass. There is tenderness (Suprapubic). There is no rebound and no guarding.  Genitourinary: There is no rash, tenderness or lesion on the right labia. There is no rash, tenderness or lesion on the left labia. Uterus is not deviated, not enlarged and not tender. Cervix exhibits motion tenderness. Right adnexum displays no mass. Left adnexum displays no mass. No erythema in the vagina. Vaginal discharge (Small amount of clear/white bubbly discharge ) found.    MAU Course  Procedures  None  MDM  13:30 Pelvic exam performed due to suprapubic pain. Wet prep sent; GC pending   14:14 Wet prep positive for clue cells and WBC    Assessment and Plan  1. Bacterial Vaginosis - Urine dipstick positive for moderate blood and few bacteria. Urine culture pending/sent  - Wet prep positive for clue cells - Toradol 60 mg inj given in MAU for pain; patient states her pain has subsided  - Flagyl 500 mg BID x 7 days; no alcohol  - Ibuprofen 600 mg q6h PRN for pain  2. RUQ pain - Possibly related to PMHx of gallbladder sludge - Not currently in pain, will need to follow up with PCP for further evaluation of gallbladder  Barrett,Stevi M 07/16/2014, 12:30 PM    Evaluation and management procedures were performed by the PA student  under my supervision and collaboration. I have reviewed the note and chart,  and I agree with the management and plan.  Darrelyn Hillock Pecolia Marando, NP 07/16/2014 3:42 PM

## 2014-07-16 NOTE — Discharge Instructions (Signed)
Bacterial Vaginosis Bacterial vaginosis is a vaginal infection that occurs when the normal balance of bacteria in the vagina is disrupted. It results from an overgrowth of certain bacteria. This is the most common vaginal infection in women of childbearing age. Treatment is important to prevent complications, especially in pregnant women, as it can cause a premature delivery. CAUSES  Bacterial vaginosis is caused by an increase in harmful bacteria that are normally present in smaller amounts in the vagina. Several different kinds of bacteria can cause bacterial vaginosis. However, the reason that the condition develops is not fully understood. RISK FACTORS Certain activities or behaviors can put you at an increased risk of developing bacterial vaginosis, including:  Having a new sex partner or multiple sex partners.  Douching.  Using an intrauterine device (IUD) for contraception. Women do not get bacterial vaginosis from toilet seats, bedding, swimming pools, or contact with objects around them. SIGNS AND SYMPTOMS  Some women with bacterial vaginosis have no signs or symptoms. Common symptoms include:  Grey vaginal discharge.  A fishlike odor with discharge, especially after sexual intercourse.  Itching or burning of the vagina and vulva.  Burning or pain with urination. DIAGNOSIS  Your health care provider will take a medical history and examine the vagina for signs of bacterial vaginosis. A sample of vaginal fluid may be taken. Your health care provider will look at this sample under a microscope to check for bacteria and abnormal cells. A vaginal pH test may also be done.  TREATMENT  Bacterial vaginosis may be treated with antibiotic medicines. These may be given in the form of a pill or a vaginal cream. A second round of antibiotics may be prescribed if the condition comes back after treatment.  HOME CARE INSTRUCTIONS   Only take over-the-counter or prescription medicines as  directed by your health care provider.  If antibiotic medicine was prescribed, take it as directed. Make sure you finish it even if you start to feel better.  Do not have sex until treatment is completed.  Tell all sexual partners that you have a vaginal infection. They should see their health care provider and be treated if they have problems, such as a mild rash or itching.  Practice safe sex by using condoms and only having one sex partner. SEEK MEDICAL CARE IF:   Your symptoms are not improving after 3 days of treatment.  You have increased discharge or pain.  You have a fever. MAKE SURE YOU:   Understand these instructions.  Will watch your condition.  Will get help right away if you are not doing well or get worse. FOR MORE INFORMATION  Centers for Disease Control and Prevention, Division of STD Prevention: www.cdc.gov/std American Sexual Health Association (ASHA): www.ashastd.org  Document Released: 06/01/2005 Document Revised: 03/22/2013 Document Reviewed: 01/11/2013 [employer] Patient Information 2015 [employer]. This information is not intended to replace advice given to you by your health care provider. Make sure you discuss any questions you have with your health care provider.  

## 2014-07-16 NOTE — MAU Note (Signed)
Pt states pain in upper but mostly lower abdomen. Was dx'd with MS last year. Has had abd pain x1 week. Feels like a cramping in pelvis, however is unsure what the cause is.

## 2014-07-17 LAB — GC/CHLAMYDIA PROBE AMP (~~LOC~~) NOT AT ARMC
Chlamydia: NEGATIVE
NEISSERIA GONORRHEA: NEGATIVE

## 2014-07-18 LAB — URINE CULTURE: Colony Count: 25000

## 2014-09-10 ENCOUNTER — Encounter (HOSPITAL_COMMUNITY): Payer: Self-pay | Admitting: Emergency Medicine

## 2014-09-10 ENCOUNTER — Emergency Department (HOSPITAL_COMMUNITY)
Admission: EM | Admit: 2014-09-10 | Discharge: 2014-09-10 | Disposition: A | Discharge status: Home / Self Care | Payer: Medicaid Other | Attending: Emergency Medicine | Admitting: Emergency Medicine

## 2014-09-10 DIAGNOSIS — Z8619 Personal history of other infectious and parasitic diseases: Secondary | ICD-10-CM | POA: Diagnosis not present

## 2014-09-10 DIAGNOSIS — Z862 Personal history of diseases of the blood and blood-forming organs and certain disorders involving the immune mechanism: Secondary | ICD-10-CM | POA: Insufficient documentation

## 2014-09-10 DIAGNOSIS — Y998 Other external cause status: Secondary | ICD-10-CM | POA: Diagnosis not present

## 2014-09-10 DIAGNOSIS — S199XXA Unspecified injury of neck, initial encounter: Secondary | ICD-10-CM | POA: Diagnosis not present

## 2014-09-10 DIAGNOSIS — X58XXXA Exposure to other specified factors, initial encounter: Secondary | ICD-10-CM | POA: Diagnosis not present

## 2014-09-10 DIAGNOSIS — S4991XA Unspecified injury of right shoulder and upper arm, initial encounter: Secondary | ICD-10-CM | POA: Diagnosis not present

## 2014-09-10 DIAGNOSIS — Z79899 Other long term (current) drug therapy: Secondary | ICD-10-CM | POA: Insufficient documentation

## 2014-09-10 DIAGNOSIS — Z8669 Personal history of other diseases of the nervous system and sense organs: Secondary | ICD-10-CM | POA: Insufficient documentation

## 2014-09-10 DIAGNOSIS — R200 Anesthesia of skin: Secondary | ICD-10-CM | POA: Diagnosis not present

## 2014-09-10 DIAGNOSIS — E669 Obesity, unspecified: Secondary | ICD-10-CM | POA: Diagnosis not present

## 2014-09-10 DIAGNOSIS — Z87440 Personal history of urinary (tract) infections: Secondary | ICD-10-CM | POA: Insufficient documentation

## 2014-09-10 DIAGNOSIS — Y9389 Activity, other specified: Secondary | ICD-10-CM | POA: Diagnosis not present

## 2014-09-10 DIAGNOSIS — M25511 Pain in right shoulder: Secondary | ICD-10-CM

## 2014-09-10 DIAGNOSIS — Z792 Long term (current) use of antibiotics: Secondary | ICD-10-CM | POA: Insufficient documentation

## 2014-09-10 DIAGNOSIS — Z8742 Personal history of other diseases of the female genital tract: Secondary | ICD-10-CM | POA: Diagnosis not present

## 2014-09-10 DIAGNOSIS — Y929 Unspecified place or not applicable: Secondary | ICD-10-CM | POA: Insufficient documentation

## 2014-09-10 DIAGNOSIS — M542 Cervicalgia: Secondary | ICD-10-CM

## 2014-09-10 MED ORDER — KETOROLAC TROMETHAMINE 60 MG/2ML IM SOLN
60.0000 mg | Freq: Once | INTRAMUSCULAR | Status: AC
Start: 2014-09-10 — End: 2014-09-10
  Administered 2014-09-10: 60 mg via INTRAMUSCULAR
  Filled 2014-09-10: qty 2

## 2014-09-10 MED ORDER — DIAZEPAM 5 MG PO TABS
5.0000 mg | ORAL_TABLET | Freq: Once | ORAL | Status: AC
  Administered 2014-09-10: 5 mg via ORAL
  Filled 2014-09-10: qty 1

## 2014-09-10 MED ORDER — METHOCARBAMOL 500 MG PO TABS: 500.0000 mg | ORAL_TABLET | Freq: Two times a day (BID) | ORAL | Status: DC

## 2014-09-10 MED ORDER — NAPROXEN 500 MG PO TABS: 500.0000 mg | ORAL_TABLET | Freq: Two times a day (BID) | ORAL | Status: DC

## 2014-09-10 NOTE — ED Notes (Signed)
Declined W/C at D/C and was escorted to lobby by RN. 

## 2014-09-10 NOTE — Discharge Instructions (Signed)
Arthralgia °Your caregiver has diagnosed you as suffering from an arthralgia. Arthralgia means there is pain in a joint. This can come from many reasons including: °· Bruising the joint which causes soreness (inflammation) in the joint. °· Wear and tear on the joints which occur as we grow older (osteoarthritis). °· Overusing the joint. °· Various forms of arthritis. °· Infections of the joint. °Regardless of the cause of pain in your joint, most of these different pains respond to anti-inflammatory drugs and rest. The exception to this is when a joint is infected, and these cases are treated with antibiotics, if it is a bacterial infection. °HOME CARE INSTRUCTIONS  °· Rest the injured area for as long as directed by your caregiver. Then slowly start using the joint as directed by your caregiver and as the pain allows. Crutches as directed may be useful if the ankles, knees or hips are involved. If the knee was splinted or casted, continue use and care as directed. If an stretchy or elastic wrapping bandage has been applied today, it should be removed and re-applied every 3 to 4 hours. It should not be applied tightly, but firmly enough to keep swelling down. Watch toes and feet for swelling, bluish discoloration, coldness, numbness or excessive pain. If any of these problems (symptoms) occur, remove the ace bandage and re-apply more loosely. If these symptoms persist, contact your caregiver or return to this location. °· For the first 24 hours, keep the injured extremity elevated on pillows while lying down. °· Apply ice for 15-20 minutes to the sore joint every couple hours while awake for the first half day. Then 03-04 times per day for the first 48 hours. Put the ice in a plastic bag and place a towel between the bag of ice and your skin. °· Wear any splinting, casting, elastic bandage applications, or slings as instructed. °· Only take over-the-counter or prescription medicines for pain, discomfort, or fever as  directed by your caregiver. Do not use aspirin immediately after the injury unless instructed by your physician. Aspirin can cause increased bleeding and bruising of the tissues. °· If you were given crutches, continue to use them as instructed and do not resume weight bearing on the sore joint until instructed. °Persistent pain and inability to use the sore joint as directed for more than 2 to 3 days are warning signs indicating that you should see a caregiver for a follow-up visit as soon as possible. Initially, a hairline fracture (break in bone) may not be evident on X-rays. Persistent pain and swelling indicate that further evaluation, non-weight bearing or use of the joint (use of crutches or slings as instructed), or further X-rays are indicated. X-rays may sometimes not show a small fracture until a week or 10 days later. Make a follow-up appointment with your own caregiver or one to whom we have referred you. A radiologist (specialist in reading X-rays) may read your X-rays. Make sure you know how you are to obtain your X-ray results. Do not assume everything is normal if you do not hear from us. °SEEK MEDICAL CARE IF: °Bruising, swelling, or pain increases. °SEEK IMMEDIATE MEDICAL CARE IF:  °· Your fingers or toes are numb or blue. °· The pain is not responding to medications and continues to stay the same or get worse. °· The pain in your joint becomes severe. °· You develop a fever over 102° F (38.9° C). °· It becomes impossible to move or use the joint. °MAKE SURE YOU:  °·   Understand these instructions.  Will watch your condition.  Will get help right away if you are not doing well or get worse. Document Released: 06/01/2005 Document Revised: 08/24/2011 Document Reviewed: 01/18/2008 [facility] Patient Information 2015 [place], [place]. This information is not intended to replace advice given to you by your health care provider. Make sure you discuss any questions you have with your health care  provider.  Cervical Strain and Sprain (Whiplash) with Rehab Cervical strain and sprain are injuries that commonly occur with "whiplash" injuries. Whiplash occurs when the neck is forcefully whipped backward or forward, such as during a motor vehicle accident or during contact sports. The muscles, ligaments, tendons, discs, and nerves of the neck are susceptible to injury when this occurs. RISK FACTORS Risk of having a whiplash injury increases if:  Osteoarthritis of the spine.  Situations that make head or neck accidents or trauma more likely.  High-risk sports (football, rugby, wrestling, hockey, auto racing, gymnastics, diving, contact karate, or boxing).  Poor strength and flexibility of the neck.  Previous neck injury.  Poor tackling technique.  Improperly fitted or padded equipment. SYMPTOMS   Pain or stiffness in the front or back of neck or both.  Symptoms may present immediately or up to 24 hours after injury.  Dizziness, headache, nausea, and vomiting.  Muscle spasm with soreness and stiffness in the neck.  Tenderness and swelling at the injury site. PREVENTION  Learn and use proper technique (avoid tackling with the head, spearing, and head-butting; use proper falling techniques to avoid landing on the head).  Warm up and stretch properly before activity.  Maintain physical fitness:  Strength, flexibility, and endurance.  Cardiovascular fitness.  Wear properly fitted and padded protective equipment, such as padded soft collars, for participation in contact sports. PROGNOSIS  Recovery from cervical strain and sprain injuries is dependent on the extent of the injury. These injuries are usually curable in 1 week to 3 months with appropriate treatment.  RELATED COMPLICATIONS   Temporary numbness and weakness may occur if the nerve roots are damaged, and this may persist until the nerve has completely healed.  Chronic pain due to frequent recurrence of  symptoms.  Prolonged healing, especially if activity is resumed too soon (before complete recovery). TREATMENT  Treatment initially involves the use of ice and medication to help reduce pain and inflammation. It is also important to perform strengthening and stretching exercises and modify activities that worsen symptoms so the injury does not get worse. These exercises may be performed at home or with a therapist. For patients who experience severe symptoms, a soft, padded collar may be recommended to be worn around the neck.  Improving your posture may help reduce symptoms. Posture improvement includes pulling your chin and abdomen in while sitting or standing. If you are sitting, sit in a firm chair with your buttocks against the back of the chair. While sleeping, try replacing your pillow with a small towel rolled to 2 inches in diameter, or use a cervical pillow or soft cervical collar. Poor sleeping positions delay healing.  For patients with nerve root damage, which causes numbness or weakness, the use of a cervical traction apparatus may be recommended. Surgery is rarely necessary for these injuries. However, cervical strain and sprains that are present at birth (congenital) may require surgery. MEDICATION   If pain medication is necessary, nonsteroidal anti-inflammatory medications, such as aspirin and ibuprofen, or other minor pain relievers, such as acetaminophen, are often recommended.  Do not take pain  medication for 7 days before surgery.  Prescription pain relievers may be given if deemed necessary by your caregiver. Use only as directed and only as much as you need. HEAT AND COLD:   Cold treatment (icing) relieves pain and reduces inflammation. Cold treatment should be applied for 10 to 15 minutes every 2 to 3 hours for inflammation and pain and immediately after any activity that aggravates your symptoms. Use ice packs or an ice massage.  Heat treatment may be used prior to  performing the stretching and strengthening activities prescribed by your caregiver, physical therapist, or athletic trainer. Use a heat pack or a warm soak. SEEK MEDICAL CARE IF:   Symptoms get worse or do not improve in 2 weeks despite treatment.  New, unexplained symptoms develop (drugs used in treatment may produce side effects). EXERCISES RANGE OF MOTION (ROM) AND STRETCHING EXERCISES - Cervical Strain and Sprain These exercises may help you when beginning to rehabilitate your injury. In order to successfully resolve your symptoms, you must improve your posture. These exercises are designed to help reduce the forward-head and rounded-shoulder posture which contributes to this condition. Your symptoms may resolve with or without further involvement from your physician, physical therapist or athletic trainer. While completing these exercises, remember:   Restoring tissue flexibility helps normal motion to return to the joints. This allows healthier, less painful movement and activity.  An effective stretch should be held for at least 20 seconds, although you may need to begin with shorter hold times for comfort.  A stretch should never be painful. You should only feel a gentle lengthening or release in the stretched tissue. STRETCH- Axial Extensors  Lie on your back on the floor. You may bend your knees for comfort. Place a rolled-up hand towel or dish towel, about 2 inches in diameter, under the part of your head that makes contact with the floor.  Gently tuck your chin, as if trying to make a "double chin," until you feel a gentle stretch at the base of your head.  Hold __________ seconds. Repeat __________ times. Complete this exercise __________ times per day.  STRETCH - Axial Extension   Stand or sit on a firm surface. Assume a good posture: chest up, shoulders drawn back, abdominal muscles slightly tense, knees unlocked (if standing) and feet hip width apart.  Slowly retract your  chin so your head slides back and your chin slightly lowers. Continue to look straight ahead.  You should feel a gentle stretch in the back of your head. Be certain not to feel an aggressive stretch since this can cause headaches later.  Hold for __________ seconds. Repeat __________ times. Complete this exercise __________ times per day. STRETCH - Cervical Side Bend   Stand or sit on a firm surface. Assume a good posture: chest up, shoulders drawn back, abdominal muscles slightly tense, knees unlocked (if standing) and feet hip width apart.  Without letting your nose or shoulders move, slowly tip your right / left ear to your shoulder until your feel a gentle stretch in the muscles on the opposite side of your neck.  Hold __________ seconds. Repeat __________ times. Complete this exercise __________ times per day. STRETCH - Cervical Rotators   Stand or sit on a firm surface. Assume a good posture: chest up, shoulders drawn back, abdominal muscles slightly tense, knees unlocked (if standing) and feet hip width apart.  Keeping your eyes level with the ground, slowly turn your head until you feel a gentle stretch along  the back and opposite side of your neck.  Hold __________ seconds. Repeat __________ times. Complete this exercise __________ times per day. RANGE OF MOTION - Neck Circles   Stand or sit on a firm surface. Assume a good posture: chest up, shoulders drawn back, abdominal muscles slightly tense, knees unlocked (if standing) and feet hip width apart.  Gently roll your head down and around from the back of one shoulder to the back of the other. The motion should never be forced or painful.  Repeat the motion 10-20 times, or until you feel the neck muscles relax and loosen. Repeat __________ times. Complete the exercise __________ times per day. STRENGTHENING EXERCISES - Cervical Strain and Sprain These exercises may help you when beginning to rehabilitate your injury. They may  resolve your symptoms with or without further involvement from your physician, physical therapist, or athletic trainer. While completing these exercises, remember:   Muscles can gain both the endurance and the strength needed for everyday activities through controlled exercises.  Complete these exercises as instructed by your physician, physical therapist, or athletic trainer. Progress the resistance and repetitions only as guided.  You may experience muscle soreness or fatigue, but the pain or discomfort you are trying to eliminate should never worsen during these exercises. If this pain does worsen, stop and make certain you are following the directions exactly. If the pain is still present after adjustments, discontinue the exercise until you can discuss the trouble with your clinician. STRENGTH - Cervical Flexors, Isometric  Face a wall, standing about 6 inches away. Place a small pillow, a ball about 6-8 inches in diameter, or a folded towel between your forehead and the wall.  Slightly tuck your chin and gently push your forehead into the soft object. Push only with mild to moderate intensity, building up tension gradually. Keep your jaw and forehead relaxed.  Hold 10 to 20 seconds. Keep your breathing relaxed.  Release the tension slowly. Relax your neck muscles completely before you start the next repetition. Repeat __________ times. Complete this exercise __________ times per day. STRENGTH- Cervical Lateral Flexors, Isometric   Stand about 6 inches away from a wall. Place a small pillow, a ball about 6-8 inches in diameter, or a folded towel between the side of your head and the wall.  Slightly tuck your chin and gently tilt your head into the soft object. Push only with mild to moderate intensity, building up tension gradually. Keep your jaw and forehead relaxed.  Hold 10 to 20 seconds. Keep your breathing relaxed.  Release the tension slowly. Relax your neck muscles completely  before you start the next repetition. Repeat __________ times. Complete this exercise __________ times per day. STRENGTH - Cervical Extensors, Isometric   Stand about 6 inches away from a wall. Place a small pillow, a ball about 6-8 inches in diameter, or a folded towel between the back of your head and the wall.  Slightly tuck your chin and gently tilt your head back into the soft object. Push only with mild to moderate intensity, building up tension gradually. Keep your jaw and forehead relaxed.  Hold 10 to 20 seconds. Keep your breathing relaxed.  Release the tension slowly. Relax your neck muscles completely before you start the next repetition. Repeat __________ times. Complete this exercise __________ times per day. POSTURE AND BODY MECHANICS CONSIDERATIONS - Cervical Strain and Sprain Keeping correct posture when sitting, standing or completing your activities will reduce the stress put on different body tissues, allowing  injured tissues a chance to heal and limiting painful experiences. The following are general guidelines for improved posture. Your physician or physical therapist will provide you with any instructions specific to your needs. While reading these guidelines, remember:  The exercises prescribed by your provider will help you have the flexibility and strength to maintain correct postures.  The correct posture provides the optimal environment for your joints to work. All of your joints have less wear and tear when properly supported by a spine with good posture. This means you will experience a healthier, less painful body.  Correct posture must be practiced with all of your activities, especially prolonged sitting and standing. Correct posture is as important when doing repetitive low-stress activities (typing) as it is when doing a single heavy-load activity (lifting). PROLONGED STANDING WHILE SLIGHTLY LEANING FORWARD When completing a task that requires you to lean  forward while standing in one place for a long time, place either foot up on a stationary 2- to 4-inch high object to help maintain the best posture. When both feet are on the ground, the low back tends to lose its slight inward curve. If this curve flattens (or becomes too large), then the back and your other joints will experience too much stress, fatigue more quickly, and can cause pain.  RESTING POSITIONS Consider which positions are most painful for you when choosing a resting position. If you have pain with flexion-based activities (sitting, bending, stooping, squatting), choose a position that allows you to rest in a less flexed posture. You would want to avoid curling into a fetal position on your side. If your pain worsens with extension-based activities (prolonged standing, working overhead), avoid resting in an extended position such as sleeping on your stomach. Most people will find more comfort when they rest with their spine in a more neutral position, neither too rounded nor too arched. Lying on a non-sagging bed on your side with a pillow between your knees, or on your back with a pillow under your knees will often provide some relief. Keep in mind, being in any one position for a prolonged period of time, no matter how correct your posture, can still lead to stiffness. WALKING Walk with an upright posture. Your ears, shoulders, and hips should all line up. OFFICE WORK When working at a desk, create an environment that supports good, upright posture. Without extra support, muscles fatigue and lead to excessive strain on joints and other tissues. CHAIR:  A chair should be able to slide under your desk when your back makes contact with the back of the chair. This allows you to work closely.  The chair's height should allow your eyes to be level with the upper part of your monitor and your hands to be slightly lower than your elbows.  Body position:  Your feet should make contact with the  floor. If this is not possible, use a foot rest.  Keep your ears over your shoulders. This will reduce stress on your neck and low back. Document Released: 06/01/2005 Document Revised: 10/16/2013 Document Reviewed: 09/13/2008 [facility] Patient Information 2015 [place], [place]. This information is not intended to replace advice given to you by your health care provider. Make sure you discuss any questions you have with your health care provider.   There does not appear to be an emergent cause for your symptoms at this time. It is important for you to take your medications as prescribed. Do not take the methocarbamol while driving or operating machinery. Please  follow-up with her primary care for further evaluation and management of your symptoms. Return to ED for new or worsening symptoms including fever, numbness or weakness, headaches, changes in vision.

## 2014-09-10 NOTE — ED Notes (Addendum)
Pt woke this am, turn her neck and had sudden "ripping feeling". Now has right neck, shoulder and arm pain. Denies numbness. Pt has hx of MS.

## 2014-09-10 NOTE — ED Provider Notes (Signed)
CSN: [id number]     Arrival date & time 09/10/14  1057 History  This chart was scribed for non-physician practitioner, Comer Locket, PA-C, working with Ripley Fraise, MD by Ladene Artist, ED Scribe. This patient was seen in room TR05C/TR05C and the patient's care was started at 11:20 AM.   No chief complaint on file.  The history is provided by the patient. No language interpreter was used.   HPI Comments: [name] is a 29 y.o. female, with a h/o multiple sclerosis, who presents to the Emergency Department complaining of sudden onset of constant neck pain since 9:30 AM today. Pt heard a "rip" in her neck this morning while turning her neck. She reports aching neck pain, 9/10 at rest, exacerbated with movement. Pt repots that pain radiates into her R shoulder and R arm. Pt reports numbness in her R hand as well as a cold sensation down her R arm. She denies HA, visual disturbances, difficulty walking. No treatments tried PTA. No other alleviating or aggravating factors. No known allergies.   Past Medical History  Diagnosis Date  . Preterm labor   . Gonorrhea   . Abnormal Pap smear     Colpo>normal  . Urinary tract infection   . Ovarian cyst   . Neuromuscular disorder     diagnosed with MS this visit  . Multiple sclerosis   . Obesity   . Pernicious anemia 05/02/2013   Past Surgical History  Procedure Laterality Date  . Tubal ligation      2012  . Colposcopy     Family History  Problem Relation Age of Onset  . Anesthesia problems Neg Hx   . Other Neg Hx   . Diabetes Mother   . Hypertension Mother   . Diabetes Father   . Hypertension Father    History  Substance Use Topics  . Smoking status: Never Smoker   . Smokeless tobacco: Never Used  . Alcohol Use: No     Comment: none since February 01, 2013 (diagnosed with MS)    OB History    Gravida Para Term Preterm AB TAB SAB Ectopic Multiple Living   6 3 1 2 3 3   1 4      Review of Systems  Eyes: Negative for  visual disturbance.  Musculoskeletal: Positive for arthralgias and neck pain. Negative for gait problem.  Neurological: Negative for numbness and headaches.   Allergies  Review of patient's allergies indicates no known allergies.  Home Medications   Prior to Admission medications   Medication Sig Start Date End Date Taking? Authorizing Provider  gabapentin (NEURONTIN) 300 MG capsule Take 300 mg by mouth 3 (three) times daily.     Historical Provider, MD  ibuprofen (ADVIL,MOTRIN) 600 MG tablet Take 1 tablet (600 mg total) by mouth every 6 (six) hours as needed. 07/16/14   Lezlie Lye, NP  methocarbamol (ROBAXIN) 500 MG tablet Take 1 tablet (500 mg total) by mouth 2 (two) times daily. 09/10/14   Comer Locket, PA-C  metroNIDAZOLE (FLAGYL) 500 MG tablet Take 1 tablet (500 mg total) by mouth 2 (two) times daily. 07/16/14   Lezlie Lye, NP  naproxen (NAPROSYN) 500 MG tablet Take 1 tablet (500 mg total) by mouth 2 (two) times daily. 09/10/14   Comer Locket, PA-C  SUMAtriptan (IMITREX) 50 MG tablet Take 50 mg by mouth every 2 (two) hours as needed for migraine.  09/13/13 09/13/14  Historical Provider, MD  topiramate (TOPAMAX SPRINKLE) 25 MG capsule Take  25 mg by mouth at bedtime.  09/13/13   Historical Provider, MD   BP 133/84 mmHg  Pulse 74  Temp(Src) 98.4 F (36.9 C)  Resp 18  Ht 5\' 4"  (1.626 m)  Wt 239 lb 6 oz (108.58 kg)  BMI 41.07 kg/m2  SpO2 99%  LMP 09/10/2014 Physical Exam  Constitutional: She is oriented to person, place, and time. She appears well-developed and well-nourished. No distress.  HENT:  Head: Normocephalic and atraumatic.  Eyes: Conjunctivae and EOM are normal.  Neck: Neck supple. Carotid bruit is not present. No tracheal deviation present.  Tenderness diffusely throughout R trapezius. C-spine ROM decreased secondary to discomfort. No midline bony tenderness. No obvious lesions, deformities, crepitus or step offs.   Cardiovascular: Normal rate, regular rhythm  and normal heart sounds.   Pulmonary/Chest: Effort normal. No respiratory distress.  Musculoskeletal: Normal range of motion.  Neurological: She is alert and oriented to person, place, and time. She has normal strength. No sensory deficit.  Grip strength intact. Motor and sensation grossly intact in bilateral upper extremities.   Skin: Skin is warm and dry.  Psychiatric: She has a normal mood and affect. Her behavior is normal.  Nursing note and vitals reviewed.  ED Course  Procedures (including critical care time) DIAGNOSTIC STUDIES: Oxygen Saturation is 99% on RA, normal by my interpretation.    COORDINATION OF CARE: 11:27 AM-Discussed treatment plan which includes Valium and Toradol with pt at bedside and pt agreed to plan.   Labs Review Labs Reviewed - No data to display  Imaging Review No results found.   EKG Interpretation None     Meds given in ED:  Medications  ketorolac (TORADOL) injection 60 mg (60 mg Intramuscular Given 09/10/14 1136)  diazepam (VALIUM) tablet 5 mg (5 mg Oral Given 09/10/14 1136)    Discharge Medication List as of 09/10/2014 12:16 PM    START taking these medications   Details  methocarbamol (ROBAXIN) 500 MG tablet Take 1 tablet (500 mg total) by mouth 2 (two) times daily., Starting 09/10/2014, Until Discontinued, Print    naproxen (NAPROSYN) 500 MG tablet Take 1 tablet (500 mg total) by mouth 2 (two) times daily., Starting 09/10/2014, Until Discontinued, Print       Filed Vitals:   09/10/14 1104  BP: 133/84  Pulse: 74  Temp: 98.4 F (36.9 C)  Resp: 18  Height: 5\' 4"  (1.626 m)  Weight: 239 lb 6 oz (108.58 kg)  SpO2: 99%    MDM  Vitals stable - WNL -afebrile Pt resting comfortably in ED. reports medications received in ED have improved her discomfort.  PE-On reevaluation, patient maintains full active range of motion of bilateral upper extremities and cervical spine with only minimal discomfort. Majority of tenderness over right  trapezius muscle without any neck tenderness. Normal neuro exam. Reassuring that this is likely musculoskeletal strain. No carotid bruits, normal neuro exam, no headache, doubt vertebral/cervical/carotid artery dissection. Low concern for other acute or emergent pathology.  I discussed all relevant lab findings and imaging results with pt and they verbalized understanding. Discussed f/u with PCP within 48 hrs and return precautions, pt very amenable to plan. Patient stable, in good condition and ambulates out of the ED without difficulty.  Final diagnoses:  Neck discomfort  Shoulder pain, acute, right   I personally performed the services described in this documentation, which was scribed in my presence. The recorded information has been reviewed and is accurate.    Comer Locket, PA-C 09/10/14 1700  Ripley Fraise, MD 09/12/14 [phone]

## 2014-11-23 ENCOUNTER — Encounter: Payer: Medicaid Other | Admitting: Family Medicine

## 2014-11-27 ENCOUNTER — Inpatient Hospital Stay (HOSPITAL_COMMUNITY): Payer: Medicaid Other

## 2014-11-27 ENCOUNTER — Inpatient Hospital Stay (HOSPITAL_COMMUNITY)
Admission: AD | Admit: 2014-11-27 | Discharge: 2014-11-27 | Disposition: A | Discharge status: Home / Self Care | Payer: Medicaid Other | Source: Ambulatory Visit | Attending: Family Medicine | Admitting: Family Medicine

## 2014-11-27 ENCOUNTER — Encounter (HOSPITAL_COMMUNITY): Payer: Self-pay

## 2014-11-27 DIAGNOSIS — R1031 Right lower quadrant pain: Secondary | ICD-10-CM | POA: Insufficient documentation

## 2014-11-27 DIAGNOSIS — R109 Unspecified abdominal pain: Secondary | ICD-10-CM | POA: Diagnosis present

## 2014-11-27 DIAGNOSIS — R102 Pelvic and perineal pain: Secondary | ICD-10-CM | POA: Insufficient documentation

## 2014-11-27 LAB — WET PREP, GENITAL
Trich, Wet Prep: NONE SEEN
Yeast Wet Prep HPF POC: NONE SEEN

## 2014-11-27 LAB — COMPREHENSIVE METABOLIC PANEL
ALT: 16 U/L (ref 14–54)
AST: 15 U/L (ref 15–41)
Albumin: 4.1 g/dL (ref 3.5–5.0)
Alkaline Phosphatase: 49 U/L (ref 38–126)
Anion gap: 5 (ref 5–15)
BILIRUBIN TOTAL: 0.4 mg/dL (ref 0.3–1.2)
BUN: 7 mg/dL (ref 6–20)
CHLORIDE: 104 mmol/L (ref 101–111)
CO2: 28 mmol/L (ref 22–32)
Calcium: 9.4 mg/dL (ref 8.9–10.3)
Creatinine, Ser: 0.9 mg/dL (ref 0.44–1.00)
GFR calc Af Amer: >60 mL/min (ref >60)
Glucose, Bld: 90 mg/dL (ref 65–99)
Potassium: 4.2 mmol/L (ref 3.5–5.1)
Sodium: 137 mmol/L (ref 135–145)
Total Protein: 7.7 g/dL (ref 6.5–8.1)

## 2014-11-27 LAB — CBC WITH DIFFERENTIAL/PLATELET
Basophils Absolute: 0 10*3/uL (ref 0.0–0.1)
Basophils Relative: 1 % (ref 0–1)
Eosinophils Absolute: 0.1 10*3/uL (ref 0.0–0.7)
Eosinophils Relative: 1 % (ref 0–5)
HCT: 36.3 % (ref 36.0–46.0)
Hemoglobin: 12 g/dL (ref 12.0–15.0)
LYMPHS ABS: 2 10*3/uL (ref 0.7–4.0)
Lymphocytes Relative: 34 % (ref 12–46)
MCH: 30.8 pg (ref 26.0–34.0)
MCHC: 33.1 g/dL (ref 30.0–36.0)
MCV: 93.1 fL (ref 78.0–100.0)
MONOS PCT: 7 % (ref 3–12)
Monocytes Absolute: 0.4 10*3/uL (ref 0.1–1.0)
NEUTROS ABS: 3.3 10*3/uL (ref 1.7–7.7)
NEUTROS PCT: 57 % (ref 43–77)
Platelets: 397 10*3/uL (ref 150–400)
RBC: 3.9 MIL/uL (ref 3.87–5.11)
RDW: 12.4 % (ref 11.5–15.5)
WBC: 5.7 10*3/uL (ref 4.0–10.5)

## 2014-11-27 LAB — URINALYSIS, ROUTINE W REFLEX MICROSCOPIC
Bilirubin Urine: NEGATIVE
GLUCOSE, UA: NEGATIVE mg/dL
Ketones, ur: NEGATIVE mg/dL
Leukocytes, UA: NEGATIVE
Nitrite: NEGATIVE
PROTEIN: NEGATIVE mg/dL
Specific Gravity, Urine: 1.025 (ref 1.005–1.030)
Urobilinogen, UA: 0.2 mg/dL (ref 0.0–1.0)
pH: 6 (ref 5.0–8.0)

## 2014-11-27 LAB — URINE MICROSCOPIC-ADD ON

## 2014-11-27 LAB — POCT PREGNANCY, URINE: PREG TEST UR: NEGATIVE

## 2014-11-27 LAB — LIPASE, BLOOD: Lipase: 23 U/L (ref 22–51)

## 2014-11-27 MED ORDER — KETOROLAC TROMETHAMINE 60 MG/2ML IM SOLN
60.0000 mg | Freq: Once | INTRAMUSCULAR | Status: AC
  Administered 2014-11-27: 60 mg via INTRAMUSCULAR
  Filled 2014-11-27: qty 2

## 2014-11-27 MED ORDER — KETOROLAC TROMETHAMINE 10 MG PO TABS: 10.0000 mg | ORAL_TABLET | Freq: Four times a day (QID) | ORAL | Status: DC | PRN

## 2014-11-27 NOTE — MAU Note (Signed)
Pain in lower abd for the past week, cramping.  Last night had pain in low back, shot up to shoulder. Last month, had a short period, only a day, was dark, this month was the same.  Had tubal 4 yrs ago

## 2014-11-27 NOTE — Discharge Instructions (Signed)
Ovarian Cyst An ovarian cyst is a fluid-filled sac that forms on an ovary. The ovaries are small organs that produce eggs in women. Various types of cysts can form on the ovaries. Most are not cancerous. Many do not cause problems, and they often go away on their own. Some may cause symptoms and require treatment. Common types of ovarian cysts include:  Functional cysts--These cysts may occur every month during the menstrual cycle. This is normal. The cysts usually go away with the next menstrual cycle if the woman does not get pregnant. Usually, there are no symptoms with a functional cyst. Your ultrasound showed that you have several enlarged follicles that are probably causing your pain. No treatment is needed because they will most likely resolve on their own.   Endometrioma cysts--These cysts form from the tissue that lines the uterus. They are also called "chocolate cysts" because they become filled with blood that turns brown. This type of cyst can cause pain in the lower abdomen during intercourse and with your menstrual period.  Cystadenoma cysts--This type develops from the cells on the outside of the ovary. These cysts can get very big and cause lower abdomen pain and pain with intercourse. This type of cyst can twist on itself, cut off its blood supply, and cause severe pain. It can also easily rupture and cause a lot of pain.  Dermoid cysts--This type of cyst is sometimes found in both ovaries. These cysts may contain different kinds of body tissue, such as skin, teeth, hair, or cartilage. They usually do not cause symptoms unless they get very big.  Theca lutein cysts--These cysts occur when too much of a certain hormone (human chorionic gonadotropin) is produced and overstimulates the ovaries to produce an egg. This is most common after procedures used to assist with the conception of a baby (in vitro fertilization). CAUSES   Fertility drugs can cause a condition in which multiple large  cysts are formed on the ovaries. This is called ovarian hyperstimulation syndrome.  A condition called polycystic ovary syndrome can cause hormonal imbalances that can lead to nonfunctional ovarian cysts. SIGNS AND SYMPTOMS  Many ovarian cysts do not cause symptoms. If symptoms are present, they may include:  Pelvic pain or pressure.  Pain in the lower abdomen.  Pain during sexual intercourse.  Increasing girth (swelling) of the abdomen.  Abnormal menstrual periods.  Increasing pain with menstrual periods.  Stopping having menstrual periods without being pregnant. DIAGNOSIS  These cysts are commonly found during a routine or annual pelvic exam. Tests may be ordered to find out more about the cyst. These tests may include:  Ultrasound.  X-ray of the pelvis.  CT scan.  MRI.  Blood tests. TREATMENT  Many ovarian cysts go away on their own without treatment. Your health care provider may want to check your cyst regularly for 2-3 months to see if it changes. For women in menopause, it is particularly important to monitor a cyst closely because of the higher rate of ovarian cancer in menopausal women. When treatment is needed, it may include any of the following:  A procedure to drain the cyst (aspiration). This may be done using a long needle and ultrasound. It can also be done through a laparoscopic procedure. This involves using a thin, lighted tube with a tiny camera on the end (laparoscope) inserted through a small incision.  Surgery to remove the whole cyst. This may be done using laparoscopic surgery or an open surgery involving a larger incision  in the lower abdomen.  Hormone treatment or birth control pills. These methods are sometimes used to help dissolve a cyst. HOME CARE INSTRUCTIONS   Only take over-the-counter or prescription medicines as directed by your health care provider.  Follow up with your health care provider as directed.  Get regular pelvic exams and  Pap tests. SEEK MEDICAL CARE IF:   Your periods are late, irregular, or painful, or they stop.  Your pelvic pain or abdominal pain does not go away.  Your abdomen becomes larger or swollen.  You have pressure on your bladder or trouble emptying your bladder completely.  You have pain during sexual intercourse.  You have feelings of fullness, pressure, or discomfort in your stomach.  You lose weight for no apparent reason.  You feel generally ill.  You become constipated.  You lose your appetite.  You develop acne.  You have an increase in body and facial hair.  You are gaining weight, without changing your exercise and eating habits.  You think you are pregnant. SEEK IMMEDIATE MEDICAL CARE IF:   You have increasing abdominal pain.  You feel sick to your stomach (nauseous), and you throw up (vomit).  You develop a fever that comes on suddenly.  You have abdominal pain during a bowel movement.  Your menstrual periods become heavier than usual. MAKE SURE YOU:  Understand these instructions.  Will watch your condition.  Will get help right away if you are not doing well or get worse. Document Released: 06/01/2005 Document Revised: 06/06/2013 Document Reviewed: 02/06/2013 [facility] Patient Information 2015 [place], [place]. This information is not intended to replace advice given to you by your health care provider. Make sure you discuss any questions you have with your health care provider.

## 2014-11-27 NOTE — MAU Provider Note (Signed)
CSN: [id number]     Arrival date & time 11/27/14  1013 History   None    Chief Complaint  Patient presents with  . Abdominal Pain  . Back Pain     (Consider location/radiation/quality/duration/timing/severity/associated sxs/prior Treatment) Patient is a 29 y.o. female presenting with abdominal pain and back pain. The history is provided by the patient.  Abdominal Pain Pain location:  RLQ Pain quality: bloating and sharp   Pain radiates to:  Does not radiate Pain severity:  Moderate Onset quality:  Gradual Duration:  1 week Timing:  Constant Chronicity:  New Relieved by:  Nothing Worsened by:  Movement Ineffective treatments:  NSAIDs Back Pain Associated symptoms: abdominal pain    [name] is a 29 y.o. female who presents to the ED with abdominal pain. The pain is located in the RLQ. The pain started about a week ago and has gotten worse. [id number] she is not pregnant and has had a BTL for birth control. She has been with her current partner x 3 years. Hx of GC several years ago. Pain not associated with menses.   Past Medical History  Diagnosis Date  . Preterm labor   . Gonorrhea   . Abnormal Pap smear     Colpo>normal  . Urinary tract infection   . Ovarian cyst   . Neuromuscular disorder     diagnosed with MS this visit  . Multiple sclerosis   . Obesity   . Pernicious anemia 05/02/2013   Past Surgical History  Procedure Laterality Date  . Tubal ligation      2012  . Colposcopy     Family History  Problem Relation Age of Onset  . Anesthesia problems Neg Hx   . Other Neg Hx   . Diabetes Mother   . Hypertension Mother   . Diabetes Father   . Hypertension Father    History  Substance Use Topics  . Smoking status: Never Smoker   . Smokeless tobacco: Never Used  . Alcohol Use: No     Comment: none since February 01, 2013 (diagnosed with MS)    OB History    Gravida Para Term Preterm AB TAB SAB Ectopic Multiple Living   6 3 1 2 3 3   1 4      Review  of Systems  Gastrointestinal: Positive for abdominal pain.  Musculoskeletal: Positive for back pain.  all other systems negative    Allergies  Review of patient's allergies indicates no known allergies.  Home Medications   Prior to Admission medications   Medication Sig Start Date End Date Taking? Authorizing Provider  gabapentin (NEURONTIN) 300 MG capsule Take 300 mg by mouth 3 (three) times daily.    Yes Historical Provider, MD  ketorolac (TORADOL) 10 MG tablet Take 1 tablet (10 mg total) by mouth every 6 (six) hours as needed. 11/27/14   [place], CNM   BP 153/87 mmHg  Pulse 63  Temp(Src) 98.4 F (36.9 C) (Oral)  Resp 16  Ht 5\' 4"  (1.626 m)  Wt 236 lb (107.049 kg)  BMI 40.49 kg/m2  LMP 11/23/2014 (Exact Date) Physical Exam  Constitutional: She is oriented to person, place, and time. She appears well-developed and well-nourished.  HENT:  Head: Normocephalic.  Eyes: EOM are normal.  Neck: Neck supple.  Cardiovascular: Normal rate.   Pulmonary/Chest: Effort normal.  Abdominal: Soft. Bowel sounds are normal. There is tenderness in the right lower quadrant. There is no rigidity, no guarding and no CVA tenderness.  Genitourinary:  External genitalia without lesions, white d/c with scant blood vaginal vault, mild CMT, right adnexal tenderness. Uterus without palpable enlargement.   Musculoskeletal: Normal range of motion.  Neurological: She is alert and oriented to person, place, and time. No cranial nerve deficit.  Skin: Skin is warm and dry.  Psychiatric: She has a normal mood and affect. Her behavior is normal.  Nursing note and vitals reviewed.   ED Course  Procedures (including critical care time) Labs Review Results for orders placed or performed during the hospital encounter of 11/27/14 (from the past 24 hour(s))  Urinalysis, Routine w reflex microscopic (not at [facility])     Status: Abnormal   Collection Time: 11/27/14 10:20 AM  Result Value Ref Range   Color,  Urine YELLOW YELLOW   APPearance CLEAR CLEAR   Specific Gravity, Urine 1.025 1.005 - 1.030   pH 6.0 5.0 - 8.0   Glucose, UA NEGATIVE NEGATIVE mg/dL   Hgb urine dipstick MODERATE (A) NEGATIVE   Bilirubin Urine NEGATIVE NEGATIVE   Ketones, ur NEGATIVE NEGATIVE mg/dL   Protein, ur NEGATIVE NEGATIVE mg/dL   Urobilinogen, UA 0.2 0.0 - 1.0 mg/dL   Nitrite NEGATIVE NEGATIVE   Leukocytes, UA NEGATIVE NEGATIVE  Urine microscopic-add on     Status: Abnormal   Collection Time: 11/27/14 10:20 AM  Result Value Ref Range   Squamous Epithelial / LPF FEW (A) RARE   RBC / HPF 3-6 <3 RBC/hpf   Bacteria, UA RARE RARE   Urine-Other MUCOUS PRESENT   Pregnancy, urine POC     Status: None   Collection Time: 11/27/14 10:40 AM  Result Value Ref Range   Preg Test, Ur NEGATIVE NEGATIVE  CBC with Differential/Platelet     Status: None   Collection Time: 11/27/14 11:33 AM  Result Value Ref Range   WBC 5.7 4.0 - 10.5 K/uL   RBC 3.90 3.87 - 5.11 MIL/uL   Hemoglobin 12.0 12.0 - 15.0 g/dL   HCT 36.3 36.0 - 46.0 %   MCV 93.1 78.0 - 100.0 fL   MCH 30.8 26.0 - 34.0 pg   MCHC 33.1 30.0 - 36.0 g/dL   RDW 12.4 11.5 - 15.5 %   Platelets 397 150 - 400 K/uL   Neutrophils Relative % 57 43 - 77 %   Neutro Abs 3.3 1.7 - 7.7 K/uL   Lymphocytes Relative 34 12 - 46 %   Lymphs Abs 2.0 0.7 - 4.0 K/uL   Monocytes Relative 7 3 - 12 %   Monocytes Absolute 0.4 0.1 - 1.0 K/uL   Eosinophils Relative 1 0 - 5 %   Eosinophils Absolute 0.1 0.0 - 0.7 K/uL   Basophils Relative 1 0 - 1 %   Basophils Absolute 0.0 0.0 - 0.1 K/uL  Comprehensive metabolic panel     Status: None   Collection Time: 11/27/14 11:33 AM  Result Value Ref Range   Sodium 137 135 - 145 mmol/L   Potassium 4.2 3.5 - 5.1 mmol/L   Chloride 104 101 - 111 mmol/L   CO2 28 22 - 32 mmol/L   Glucose, Bld 90 65 - 99 mg/dL   BUN 7 6 - 20 mg/dL   Creatinine, Ser 0.90 0.44 - 1.00 mg/dL   Calcium 9.4 8.9 - 10.3 mg/dL   Total Protein 7.7 6.5 - 8.1 g/dL   Albumin 4.1  3.5 - 5.0 g/dL   AST 15 15 - 41 U/L   ALT 16 14 - 54 U/L   Alkaline  Phosphatase 49 38 - 126 U/L   Total Bilirubin 0.4 0.3 - 1.2 mg/dL   GFR calc non Af Amer >60 >60 mL/min   GFR calc Af Amer >60 >60 mL/min   Anion gap 5 5 - 15  Lipase, blood     Status: None   Collection Time: 11/27/14 11:33 AM  Result Value Ref Range   Lipase 23 22 - 51 U/L  Wet prep, genital     Status: Abnormal   Collection Time: 11/27/14 11:40 AM  Result Value Ref Range   Yeast Wet Prep HPF POC NONE SEEN NONE SEEN   Trich, Wet Prep NONE SEEN NONE SEEN   Clue Cells Wet Prep HPF POC FEW (A) NONE SEEN   WBC, Wet Prep HPF POC FEW (A) NONE SEEN     Imaging Review US Transvaginal Non-ob  11/27/2014   CLINICAL DATA:  29 year old female with right lower quadrant pain for 1 week. Irregular cycles. Cramping. Negative urine pregnancy test today. Initial encounter.  EXAM: TRANSABDOMINAL AND TRANSVAGINAL ULTRASOUND OF PELVIS  TECHNIQUE: Both transabdominal and transvaginal ultrasound examinations of the pelvis were performed. Transabdominal technique was performed for global imaging of the pelvis including uterus, ovaries, adnexal regions, and pelvic cul-de-sac. It was necessary to proceed with endovaginal exam following the transabdominal exam to visualize the ovaries.  COMPARISON:  Pelvis ultrasound 01/29/2011  FINDINGS: Uterus  Measurements: 10.3 x 4.5 x 5.2 cm. Small dystrophic calcifications suspected (image 13) and are chronic. No fibroids or other mass visualized.  Endometrium  Thickness: 8 mm. Mildly indistinct, similar to the prior study, with no focal abnormality.  Right ovary  Measurements: 3.8 x 2.7 x 2.8 cm. Multiple simple appearing follicles, up to 2.5 cm. Normal appearance/no adnexal mass.  Left ovary  Measurements: Not visualized despite trans abdominal and transvaginal attempts.  Other findings  No free fluid.  IMPRESSION: Essentially negative pelvis ultrasound; the left ovary could not be identified despite trans  abdominal and transvaginal imaging attempts.   Electronically Signed   By: Genevie Ann M.D.   On: 11/27/2014 12:34   US Pelvis Complete  11/27/2014   CLINICAL DATA:  29 year old female with right lower quadrant pain for 1 week. Irregular cycles. Cramping. Negative urine pregnancy test today. Initial encounter.  EXAM: TRANSABDOMINAL AND TRANSVAGINAL ULTRASOUND OF PELVIS  TECHNIQUE: Both transabdominal and transvaginal ultrasound examinations of the pelvis were performed. Transabdominal technique was performed for global imaging of the pelvis including uterus, ovaries, adnexal regions, and pelvic cul-de-sac. It was necessary to proceed with endovaginal exam following the transabdominal exam to visualize the ovaries.  COMPARISON:  Pelvis ultrasound 01/29/2011  FINDINGS: Uterus  Measurements: 10.3 x 4.5 x 5.2 cm. Small dystrophic calcifications suspected (image 13) and are chronic. No fibroids or other mass visualized.  Endometrium  Thickness: 8 mm. Mildly indistinct, similar to the prior study, with no focal abnormality.  Right ovary  Measurements: 3.8 x 2.7 x 2.8 cm. Multiple simple appearing follicles, up to 2.5 cm. Normal appearance/no adnexal mass.  Left ovary  Measurements: Not visualized despite trans abdominal and transvaginal attempts.  Other findings  No free fluid.  IMPRESSION: Essentially negative pelvis ultrasound; the left ovary could not be identified despite trans abdominal and transvaginal imaging attempts.   Electronically Signed   By: Genevie Ann M.D.   On: 11/27/2014 12:34   ULtrasound reviewed and there are tiny follicles right but no mass, no uterine fibroids.   MDM  29 y.o. female with pelvic pain, improved greatly with  Toradol IM. Stable for d/c with out patient treatment with NSAIDS. Discussed with the patient clinical, lab and u/s results and all questioned fully answered. She will return if any problems arise.  Final diagnoses:  Pelvic pain in female  RLQ abdominal pain

## 2014-11-27 NOTE — Progress Notes (Signed)
RLQ x 1 week, some light headedness.

## 2014-11-27 NOTE — MAU Note (Cosign Needed)
Chief Complaint:  Abdominal Pain and Back Pain   [name] is a 29 y.o.  [id number] with MS, h/o R ovarian cyst, gonorrhea, UTI, gallbladder sludge, and BTL presenting with middle lower and RLQ abdominal pain. The pain started 1 week ago as "irritating", but has been constant, worsening to 9/10 today, sharp, radiating to her back and left shoulder. Pain worsens with movement, ibuprofen has not improved pain. 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 The pain is not associated with her menstrual cycle. Periods are usually regular of 2 weeks duration. In the last 2 months, periods have lasted only 1 day with dark, thick spotting, irregular intervals in between. LMP 11/23/14. Pt denies significant menstrual cramping. Endorses feeling of abdominal fullness, like needing to pass gas. Denies constipation; regular daily BMs. Endorses nausea, no vomiting. Denies previous similar episodes. MS pain is "achy" in arms, legs. Denies h/o back pain.   Pt is in a monogamous relationship with the same female sexual partner for 3 years, does not use condoms. Pt has a h/o gonorrhea as a teenager; also UTI, yeast infection years ago. Denies dysuria, urinary frequency, abnormal vaginal discharge, intermenstrual bleeding.   Menstrual History: OB History    Gravida Para Term Preterm AB TAB SAB Ectopic Multiple Living   6 3 1 2 3 3   1 4       Patient's last menstrual period was 11/23/2014 (exact date).     Past Medical History  Diagnosis Date  . Preterm labor   . Gonorrhea   . Abnormal Pap smear     Colpo>normal  . Urinary tract infection   . Ovarian cyst   . Neuromuscular disorder     diagnosed with MS this visit  . Multiple sclerosis   . Obesity   . Pernicious anemia 05/02/2013    Past Surgical History  Procedure Laterality Date  . Tubal ligation      2012  . Colposcopy      Family History  Problem Relation Age of Onset  . Anesthesia problems Neg Hx   . Other Neg Hx   .  Diabetes Mother   . Hypertension Mother   . Diabetes Father   . Hypertension Father     History  Substance Use Topics  . Smoking status: Never Smoker   . Smokeless tobacco: Never Used  . Alcohol Use: No     Comment: none since February 01, 2013 (diagnosed with MS)      No Known Allergies  Prescriptions prior to admission  Medication Sig Dispense Refill Last Dose  . gabapentin (NEURONTIN) 300 MG capsule Take 300 mg by mouth 3 (three) times daily.    07/15/2014 at Unknown time  . ibuprofen (ADVIL,MOTRIN) 600 MG tablet Take 1 tablet (600 mg total) by mouth every 6 (six) hours as needed. 30 tablet 0   . methocarbamol (ROBAXIN) 500 MG tablet Take 1 tablet (500 mg total) by mouth 2 (two) times daily. 20 tablet 0   . metroNIDAZOLE (FLAGYL) 500 MG tablet Take 1 tablet (500 mg total) by mouth 2 (two) times daily. 14 tablet 0   . naproxen (NAPROSYN) 500 MG tablet Take 1 tablet (500 mg total) by mouth 2 (two) times daily. 30 tablet 0   . SUMAtriptan (IMITREX) 50 MG tablet Take 50 mg by mouth every 2 (two) hours as needed for migraine.    07/15/2014 at Unknown time  . topiramate (TOPAMAX SPRINKLE) 25 MG capsule Take  25 mg by mouth at bedtime.    07/15/2014 at Unknown time   Taken daily: Gabapentin Ibuprofen  Review of Systems - Negative except for what is mentioned in HPI.  Physical Exam  Blood pressure 155/89, pulse 91, temperature 98.4 F (36.9 C), temperature source Oral, resp. rate 18, height 5\' 4"  (1.626 m), weight 107.049 kg (236 lb), last menstrual period 11/23/2014. GENERAL: Well-developed, well-nourished female in no acute distress.  LUNGS: Clear to auscultation bilaterally.  HEART: Regular rate and rhythm, nl S1 S2 ABDOMEN: Soft, nondistended, very tender to palpation middle lower and RLQ, slightly tender to palpation RUQ, +bowel sounds EXTREMITIES: Nontender, no edema, 2+ distal pulses. VAGINAL EXAM: appropriate normal vaginal discharge, no cervical erythema, positive cervical  motion tenderness BIMANUAL EXAM: R abd pain, no L abd pain  Labs: Results for orders placed or performed during the hospital encounter of 11/27/14 (from the past 24 hour(s))  Pregnancy, urine POC   Collection Time: 11/27/14 10:40 AM  Result Value Ref Range   Preg Test, Ur NEGATIVE NEGATIVE    Imaging Studies:  No results found.  Assessment: [name] is  29 y.o. [phone] with MS, h/o R ovarian cyst, gallbladder sludge presents with RLQ abd pain of 1 week duration. Differential includes pain 2/2 R ovarian cyst, appendicitis, menstrual cramps, STI (GC/chlamydia), endometriosis, fibroids (would expect more pain, association with menstruation). Ovarian cyst is top of my differential. Appendicitis is less likely - pt appears comfortable, no rebound tenderness or guarding. STI's are possible given her history and lack of condom use. Endometriosis, fibroids less likely - periods are now less heavy.  US Transvaginal Non-ob  11/27/2014   CLINICAL DATA:  29 year old female with right lower quadrant pain for 1 week. Irregular cycles. Cramping. Negative urine pregnancy test today. Initial encounter.  EXAM: TRANSABDOMINAL AND TRANSVAGINAL ULTRASOUND OF PELVIS  TECHNIQUE: Both transabdominal and transvaginal ultrasound examinations of the pelvis were performed. Transabdominal technique was performed for global imaging of the pelvis including uterus, ovaries, adnexal regions, and pelvic cul-de-sac. It was necessary to proceed with endovaginal exam following the transabdominal exam to visualize the ovaries.  COMPARISON:  Pelvis ultrasound 01/29/2011  FINDINGS: Uterus  Measurements: 10.3 x 4.5 x 5.2 cm. Small dystrophic calcifications suspected (image 13) and are chronic. No fibroids or other mass visualized.  Endometrium  Thickness: 8 mm. Mildly indistinct, similar to the prior study, with no focal abnormality.  Right ovary  Measurements: 3.8 x 2.7 x 2.8 cm. Multiple simple appearing follicles, up to 2.5 cm.  Normal appearance/no adnexal mass.  Left ovary  Measurements: Not visualized despite trans abdominal and transvaginal attempts.  Other findings  No free fluid.  IMPRESSION: Essentially negative pelvis ultrasound; the left ovary could not be identified despite trans abdominal and transvaginal imaging attempts.   Electronically Signed   By: Genevie Ann M.D.   On: 11/27/2014 12:34   US Pelvis Complete  11/27/2014   CLINICAL DATA:  29 year old female with right lower quadrant pain for 1 week. Irregular cycles. Cramping. Negative urine pregnancy test today. Initial encounter.  EXAM: TRANSABDOMINAL AND TRANSVAGINAL ULTRASOUND OF PELVIS  TECHNIQUE: Both transabdominal and transvaginal ultrasound examinations of the pelvis were performed. Transabdominal technique was performed for global imaging of the pelvis including uterus, ovaries, adnexal regions, and pelvic cul-de-sac. It was necessary to proceed with endovaginal exam following the transabdominal exam to visualize the ovaries.  COMPARISON:  Pelvis ultrasound 01/29/2011  FINDINGS: Uterus  Measurements: 10.3 x 4.5 x 5.2 cm. Small dystrophic calcifications suspected (  image 13) and are chronic. No fibroids or other mass visualized.  Endometrium  Thickness: 8 mm. Mildly indistinct, similar to the prior study, with no focal abnormality.  Right ovary  Measurements: 3.8 x 2.7 x 2.8 cm. Multiple simple appearing follicles, up to 2.5 cm. Normal appearance/no adnexal mass.  Left ovary  Measurements: Not visualized despite trans abdominal and transvaginal attempts.  Other findings  No free fluid.  IMPRESSION: Essentially negative pelvis ultrasound; the left ovary could not be identified despite trans abdominal and transvaginal imaging attempts.   Electronically Signed   By: Genevie Ann M.D.   On: 11/27/2014 12:34    Toradol and GYN follow up. Jingpeng He Medical Student 6/14/201610:51 AM  I have examined this patient and assisted the student with assessment and plan.

## 2014-11-28 ENCOUNTER — Other Ambulatory Visit: Payer: Self-pay | Admitting: Psychiatry

## 2014-11-28 DIAGNOSIS — G35 Multiple sclerosis: Secondary | ICD-10-CM

## 2014-11-28 LAB — GC/CHLAMYDIA PROBE AMP (~~LOC~~) NOT AT ARMC
CHLAMYDIA, DNA PROBE: NEGATIVE
Neisseria Gonorrhea: NEGATIVE

## 2014-11-28 LAB — RPR: RPR: NONREACTIVE

## 2014-11-28 LAB — HIV ANTIBODY (ROUTINE TESTING W REFLEX): HIV Screen 4th Generation wRfx: NONREACTIVE

## 2014-12-01 ENCOUNTER — Ambulatory Visit
Admission: RE | Admit: 2014-12-01 | Discharge: 2014-12-01 | Disposition: A | Discharge status: Home / Self Care | Payer: Medicaid Other | Source: Ambulatory Visit | Attending: Psychiatry | Admitting: Psychiatry

## 2014-12-01 DIAGNOSIS — G35 Multiple sclerosis: Secondary | ICD-10-CM

## 2014-12-01 MED ORDER — GADOBENATE DIMEGLUMINE 529 MG/ML IV SOLN
20.0000 mL | Freq: Once | INTRAVENOUS | Status: AC | PRN
  Administered 2014-12-01: 20 mL via INTRAVENOUS

## 2015-03-11 ENCOUNTER — Encounter (HOSPITAL_COMMUNITY): Payer: Self-pay | Admitting: *Deleted

## 2015-03-11 ENCOUNTER — Inpatient Hospital Stay (HOSPITAL_COMMUNITY): Payer: Medicaid Other

## 2015-03-11 ENCOUNTER — Inpatient Hospital Stay (HOSPITAL_COMMUNITY)
Admission: AD | Admit: 2015-03-11 | Discharge: 2015-03-11 | Disposition: A | Discharge status: Home / Self Care | Payer: Medicaid Other | Source: Ambulatory Visit | Attending: Family Medicine | Admitting: Family Medicine

## 2015-03-11 DIAGNOSIS — E669 Obesity, unspecified: Secondary | ICD-10-CM | POA: Insufficient documentation

## 2015-03-11 DIAGNOSIS — R1030 Lower abdominal pain, unspecified: Secondary | ICD-10-CM | POA: Diagnosis not present

## 2015-03-11 DIAGNOSIS — R1909 Other intra-abdominal and pelvic swelling, mass and lump: Secondary | ICD-10-CM | POA: Diagnosis not present

## 2015-03-11 DIAGNOSIS — M549 Dorsalgia, unspecified: Secondary | ICD-10-CM | POA: Diagnosis present

## 2015-03-11 DIAGNOSIS — R312 Other microscopic hematuria: Secondary | ICD-10-CM | POA: Diagnosis not present

## 2015-03-11 DIAGNOSIS — N888 Other specified noninflammatory disorders of cervix uteri: Secondary | ICD-10-CM | POA: Diagnosis not present

## 2015-03-11 DIAGNOSIS — N8320 Unspecified ovarian cysts: Secondary | ICD-10-CM | POA: Diagnosis not present

## 2015-03-11 DIAGNOSIS — G35 Multiple sclerosis: Secondary | ICD-10-CM | POA: Diagnosis not present

## 2015-03-11 DIAGNOSIS — R102 Pelvic and perineal pain: Secondary | ICD-10-CM

## 2015-03-11 DIAGNOSIS — N83201 Unspecified ovarian cyst, right side: Secondary | ICD-10-CM

## 2015-03-11 DIAGNOSIS — R109 Unspecified abdominal pain: Secondary | ICD-10-CM | POA: Diagnosis present

## 2015-03-11 HISTORY — DX: Unspecified abnormal cytological findings in specimens from vagina: R87.629

## 2015-03-11 LAB — CBC WITH DIFFERENTIAL/PLATELET
BASOS PCT: 1 %
Basophils Absolute: 0 10*3/uL (ref 0.0–0.1)
Eosinophils Absolute: 0.1 10*3/uL (ref 0.0–0.7)
Eosinophils Relative: 1 %
HCT: 34.5 % — ABNORMAL LOW (ref 36.0–46.0)
Hemoglobin: 10.9 g/dL — ABNORMAL LOW (ref 12.0–15.0)
LYMPHS ABS: 2.3 10*3/uL (ref 0.7–4.0)
Lymphocytes Relative: 33 %
MCH: 29.6 pg (ref 26.0–34.0)
MCHC: 31.6 g/dL (ref 30.0–36.0)
MCV: 93.8 fL (ref 78.0–100.0)
MONO ABS: 0.6 10*3/uL (ref 0.1–1.0)
Monocytes Relative: 8 %
NEUTROS ABS: 4.2 10*3/uL (ref 1.7–7.7)
Neutrophils Relative %: 57 %
Platelets: 392 10*3/uL (ref 150–400)
RBC: 3.68 MIL/uL — ABNORMAL LOW (ref 3.87–5.11)
RDW: 12.4 % (ref 11.5–15.5)
WBC: 7.2 10*3/uL (ref 4.0–10.5)

## 2015-03-11 LAB — URINALYSIS, ROUTINE W REFLEX MICROSCOPIC
Bilirubin Urine: NEGATIVE
GLUCOSE, UA: NEGATIVE mg/dL
KETONES UR: NEGATIVE mg/dL
Leukocytes, UA: NEGATIVE
NITRITE: NEGATIVE
PROTEIN: NEGATIVE mg/dL
Specific Gravity, Urine: 1.02 (ref 1.005–1.030)
Urobilinogen, UA: 0.2 mg/dL (ref 0.0–1.0)
pH: 6.5 (ref 5.0–8.0)

## 2015-03-11 LAB — URINE MICROSCOPIC-ADD ON

## 2015-03-11 LAB — POCT PREGNANCY, URINE: Preg Test, Ur: NEGATIVE

## 2015-03-11 MED ORDER — KETOROLAC TROMETHAMINE 60 MG/2ML IM SOLN
60.0000 mg | Freq: Once | INTRAMUSCULAR | Status: AC
  Administered 2015-03-11: 60 mg via INTRAMUSCULAR
  Filled 2015-03-11: qty 2

## 2015-03-11 MED ORDER — IBUPROFEN 600 MG PO TABS: 600.0000 mg | ORAL_TABLET | Freq: Four times a day (QID) | ORAL | Status: DC | PRN

## 2015-03-11 MED ORDER — TRAMADOL HCL 50 MG PO TABS: 50.0000 mg | ORAL_TABLET | Freq: Four times a day (QID) | ORAL | Status: DC | PRN

## 2015-03-11 NOTE — Discharge Instructions (Signed)
Fibroids °Fibroids are lumps (tumors) that can occur any place in a woman's body. These lumps are not cancerous. Fibroids vary in size, weight, and where they grow. °HOME CARE °· Do not take aspirin. °· Write down the number of pads or tampons you use during your period. Tell your doctor. This can help determine the best treatment for you. °GET HELP RIGHT AWAY IF: °· You have pain in your lower belly (abdomen) that is not helped with medicine. °· You have cramps that are not helped with medicine. °· You have more bleeding between or during your period. °· You feel lightheaded or pass out (faint). °· Your lower belly pain gets worse. °MAKE SURE YOU: °· Understand these instructions. °· Will watch your condition. °· Will get help right away if you are not doing well or get worse. °Document Released: 07/04/2010 Document Revised: 08/24/2011 Document Reviewed: 07/04/2010 °[employer]® Patient Information ©2015 [employer]. This information is not intended to replace advice given to you by your health care provider. Make sure you discuss any questions you have with your health care provider. ° °Ovarian Cyst °An ovarian cyst is a fluid-filled sac that forms on an ovary. The ovaries are small organs that produce eggs in women. Various types of cysts can form on the ovaries. Most are not cancerous. Many do not cause problems, and they often go away on their own. Some may cause symptoms and require treatment. Common types of ovarian cysts include: °· Functional cysts--These cysts may occur every month during the menstrual cycle. This is normal. The cysts usually go away with the next menstrual cycle if the woman does not get pregnant. Usually, there are no symptoms with a functional cyst. °· Endometrioma cysts--These cysts form from the tissue that lines the uterus. They are also called "chocolate cysts" because they become filled with blood that turns brown. This type of cyst can cause pain in the lower abdomen during  intercourse and with your menstrual period. °· Cystadenoma cysts--This type develops from the cells on the outside of the ovary. These cysts can get very big and cause lower abdomen pain and pain with intercourse. This type of cyst can twist on itself, cut off its blood supply, and cause severe pain. It can also easily rupture and cause a lot of pain. °· Dermoid cysts--This type of cyst is sometimes found in both ovaries. These cysts may contain different kinds of body tissue, such as skin, teeth, hair, or cartilage. They usually do not cause symptoms unless they get very big. °· Theca lutein cysts--These cysts occur when too much of a certain hormone (human chorionic gonadotropin) is produced and overstimulates the ovaries to produce an egg. This is most common after procedures used to assist with the conception of a baby (in vitro fertilization). °CAUSES  °· Fertility drugs can cause a condition in which multiple large cysts are formed on the ovaries. This is called ovarian hyperstimulation syndrome. °· A condition called polycystic ovary syndrome can cause hormonal imbalances that can lead to nonfunctional ovarian cysts. °SIGNS AND SYMPTOMS  °Many ovarian cysts do not cause symptoms. If symptoms are present, they may include: °· Pelvic pain or pressure. °· Pain in the lower abdomen. °· Pain during sexual intercourse. °· Increasing girth (swelling) of the abdomen. °· Abnormal menstrual periods. °· Increasing pain with menstrual periods. °· Stopping having menstrual periods without being pregnant. °DIAGNOSIS  °These cysts are commonly found during a routine or annual pelvic exam. Tests may be ordered to find   out more about the cyst. These tests may include: °· Ultrasound. °· X-ray of the pelvis. °· CT scan. °· MRI. °· Blood tests. °TREATMENT  °Many ovarian cysts go away on their own without treatment. Your health care provider may want to check your cyst regularly for 2-3 months to see if it changes. For women in  menopause, it is particularly important to monitor a cyst closely because of the higher rate of ovarian cancer in menopausal women. When treatment is needed, it may include any of the following: °· A procedure to drain the cyst (aspiration). This may be done using a long needle and ultrasound. It can also be done through a laparoscopic procedure. This involves using a thin, lighted tube with a tiny camera on the end (laparoscope) inserted through a small incision. °· Surgery to remove the whole cyst. This may be done using laparoscopic surgery or an open surgery involving a larger incision in the lower abdomen. °· Hormone treatment or birth control pills. These methods are sometimes used to help dissolve a cyst. °HOME CARE INSTRUCTIONS  °· Only take over-the-counter or prescription medicines as directed by your health care provider. °· Follow up with your health care provider as directed. °· Get regular pelvic exams and Pap tests. °SEEK MEDICAL CARE IF:  °· Your periods are late, irregular, or painful, or they stop. °· Your pelvic pain or abdominal pain does not go away. °· Your abdomen becomes larger or swollen. °· You have pressure on your bladder or trouble emptying your bladder completely. °· You have pain during sexual intercourse. °· You have feelings of fullness, pressure, or discomfort in your stomach. °· You lose weight for no apparent reason. °· You feel generally ill. °· You become constipated. °· You lose your appetite. °· You develop acne. °· You have an increase in body and facial hair. °· You are gaining weight, without changing your exercise and eating habits. °· You think you are pregnant. °SEEK IMMEDIATE MEDICAL CARE IF:  °· You have increasing abdominal pain. °· You feel sick to your stomach (nauseous), and you throw up (vomit). °· You develop a fever that comes on suddenly. °· You have abdominal pain during a bowel movement. °· Your menstrual periods become heavier than usual. °MAKE SURE  YOU: °· Understand these instructions. °· Will watch your condition. °· Will get help right away if you are not doing well or get worse. °Document Released: 06/01/2005 Document Revised: 06/06/2013 Document Reviewed: 02/06/2013 °[employer]® Patient Information ©2015 [employer]. This information is not intended to replace advice given to you by your health care provider. Make sure you discuss any questions you have with your health care provider. ° °

## 2015-03-11 NOTE — MAU Provider Note (Signed)
History     CSN: [id number]  Arrival date and time: 03/11/15 1028   First Provider Initiated Contact with Patient 03/11/15 1147      Chief Complaint  Patient presents with  . Abdominal Pain  . Back Pain   This is a 29 y.o. female who presents with c/o Left lower abdominal pain which radiates to left lower back area. States it started yesterday. Hurts more with intercourse. Denies fever, N/V/D/C. Reports urinary frequency and dysuria. Has never had this before. States had one abnormal pap in past with normal colposcopy.    Pelvic Pain The patient's primary symptoms include pelvic pain. The patient's pertinent negatives include no genital itching, genital lesions, genital odor, genital rash, missed menses, vaginal bleeding or vaginal discharge. This is a new problem. The current episode started yesterday. The problem occurs constantly. The problem has been unchanged. The pain is moderate. The problem affects the left side. She is not pregnant. Associated symptoms include abdominal pain, back pain, dysuria, frequency and painful intercourse. Pertinent negatives include no chills, constipation, diarrhea, fever, flank pain, hematuria, nausea, rash, urgency or vomiting. The symptoms are aggravated by tactile pressure and intercourse. She has tried nothing for the symptoms. She is sexually active. It is unknown whether or not her partner has an STD. Her menstrual history has been regular.   RN Note: Pain in lower abd, now radiates around to back on left side. Started yesterday. Frequency, urgency and pain associated with urination. Neg CVA tenderness          OB History    Gravida Para Term Preterm AB TAB SAB Ectopic Multiple Living   6 3 1 2 3 3   1 4       Past Medical History  Diagnosis Date  . Preterm labor   . Gonorrhea   . Abnormal Pap smear     Colpo>normal  . Urinary tract infection   . Ovarian cyst   . Neuromuscular disorder     diagnosed with MS this visit  .  Multiple sclerosis   . Obesity   . Pernicious anemia 05/02/2013  . Vaginal Pap smear, abnormal     Past Surgical History  Procedure Laterality Date  . Tubal ligation      2012  . Colposcopy      Family History  Problem Relation Age of Onset  . Anesthesia problems Neg Hx   . Other Neg Hx   . Diabetes Mother   . Hypertension Mother   . Diabetes Father   . Hypertension Father     Social History  Substance Use Topics  . Smoking status: Never Smoker   . Smokeless tobacco: Never Used  . Alcohol Use: No     Comment: none since February 01, 2013 (diagnosed with MS)     Allergies: No Known Allergies  Prescriptions prior to admission  Medication Sig Dispense Refill Last Dose  . gabapentin (NEURONTIN) 300 MG capsule Take 300 mg by mouth 3 (three) times daily.    11/26/2014 at Unknown time  . ketorolac (TORADOL) 10 MG tablet Take 1 tablet (10 mg total) by mouth every 6 (six) hours as needed. 20 tablet 0     Review of Systems  Constitutional: Negative for fever, chills and malaise/fatigue.  Gastrointestinal: Positive for abdominal pain. Negative for nausea, vomiting, diarrhea and constipation.  Genitourinary: Positive for dysuria, frequency and pelvic pain. Negative for urgency, hematuria, flank pain, vaginal discharge and missed menses.  Musculoskeletal: Positive for back pain.  Skin:  Negative for rash.  Neurological: Negative for weakness.   Physical Exam   Blood pressure 135/69, pulse 64, temperature 97.9 F (36.6 C), temperature source Oral, resp. rate 18, height 5' 5.5" (1.664 m), weight 242 lb (109.77 kg), last menstrual period 02/23/2015.  Physical Exam  Constitutional: She is oriented to person, place, and time. She appears well-developed and well-nourished. No distress.  HENT:  Head: Normocephalic.  Cardiovascular: Normal rate, regular rhythm and normal heart sounds.  Exam reveals no gallop and no friction rub.   No murmur heard. Respiratory: Effort normal and  breath sounds normal. No respiratory distress. She has no wheezes. She has no rales.  GI: Soft. She exhibits no distension and no mass. There is tenderness (LLQ). There is no rebound and no guarding.  Genitourinary: Vagina normal. No vaginal discharge found.  Cervix closed There is a grape-sized smooth, round, soft pink mass to left of cervix.  Pressure on this mass reproduces LLQ pain. Uterus and adnexae difficult to palpate due to habitus   Musculoskeletal: Normal range of motion.  Neurological: She is alert and oriented to person, place, and time.  Skin: Skin is warm and dry.  Psychiatric: She has a normal mood and affect.    MAU Course  Procedures  MDM WIll check CBC and Ultrasound of pelvis and kidneys/ureters to evaluate for possible stone or pelvic problems Results for orders placed or performed during the hospital encounter of 03/11/15 (from the past 24 hour(s))  Urinalysis, Routine w reflex microscopic (not at [facility])     Status: Abnormal   Collection Time: 03/11/15 11:05 AM  Result Value Ref Range   Color, Urine YELLOW YELLOW   APPearance CLEAR CLEAR   Specific Gravity, Urine 1.020 1.005 - 1.030   pH 6.5 5.0 - 8.0   Glucose, UA NEGATIVE NEGATIVE mg/dL   Hgb urine dipstick MODERATE (A) NEGATIVE   Bilirubin Urine NEGATIVE NEGATIVE   Ketones, ur NEGATIVE NEGATIVE mg/dL   Protein, ur NEGATIVE NEGATIVE mg/dL   Urobilinogen, UA 0.2 0.0 - 1.0 mg/dL   Nitrite NEGATIVE NEGATIVE   Leukocytes, UA NEGATIVE NEGATIVE  Urine microscopic-add on     Status: Abnormal   Collection Time: 03/11/15 11:05 AM  Result Value Ref Range   Squamous Epithelial / LPF FEW (A) RARE   RBC / HPF 3-6 <3 RBC/hpf   Bacteria, UA FEW (A) RARE  Pregnancy, urine POC     Status: None   Collection Time: 03/11/15 11:11 AM  Result Value Ref Range   Preg Test, Ur NEGATIVE NEGATIVE  CBC with Differential     Status: Abnormal   Collection Time: 03/11/15 12:06 PM  Result Value Ref Range   WBC 7.2 4.0 - 10.5  K/uL   RBC 3.68 (L) 3.87 - 5.11 MIL/uL   Hemoglobin 10.9 (L) 12.0 - 15.0 g/dL   HCT 34.5 (L) 36.0 - 46.0 %   MCV 93.8 78.0 - 100.0 fL   MCH 29.6 26.0 - 34.0 pg   MCHC 31.6 30.0 - 36.0 g/dL   RDW 12.4 11.5 - 15.5 %   Platelets 392 150 - 400 K/uL   Neutrophils Relative % 57 %   Neutro Abs 4.2 1.7 - 7.7 K/uL   Lymphocytes Relative 33 %   Lymphs Abs 2.3 0.7 - 4.0 K/uL   Monocytes Relative 8 %   Monocytes Absolute 0.6 0.1 - 1.0 K/uL   Eosinophils Relative 1 %   Eosinophils Absolute 0.1 0.0 - 0.7 K/uL   Basophils Relative 1 %  Basophils Absolute 0.0 0.0 - 0.1 K/uL   US Transvaginal Non-ob  03/11/2015   ADDENDUM REPORT: 03/11/2015 14:49 ADDENDUM: Dictation error in the impression of the original report. The simple ovarian cysts are located in the RIGHT ovary, as corrected described in the findings section. The LEFT ovary is within normal limits. The remainder of the report is unchanged. Electronically Signed   By: Julian Hy M.D.   On: 03/11/2015 14:49  03/11/2015   CLINICAL DATA:  Left lower quadrant pain  EXAM: TRANSABDOMINAL AND TRANSVAGINAL ULTRASOUND OF PELVIS  TECHNIQUE: Both transabdominal and transvaginal ultrasound examinations of the pelvis were performed. Transabdominal technique was performed for global imaging of the pelvis including uterus, ovaries, adnexal regions, and pelvic cul-de-sac. It was necessary to proceed with endovaginal exam following the transabdominal exam to visualize the endometrium.  COMPARISON:  11/27/2014  FINDINGS: Uterus  Measurements: 10.1 x 5.0 x 5.5 cm. No fibroids or other mass visualized. Prominent soft tissue in the region of the cervix.  Endometrium  Thickness: 12 mm. 13 x 10 x 9 mm echogenic lesion in the endometrial fundus.  Right ovary  Measurements: 5.4 x 3.3 x 3.8 cm. Three simple cysts measuring up to 4.1 x 2.6 x 2.2 cm.  Left ovary  Measurements: 2.8 x 1.7 x 2.6 cm. Normal appearance/no adnexal mass.  Other findings  No free fluid.  IMPRESSION:  Three simple left ovarian cyst measuring up to 4.1 cm, likely physiologic.  13 mm echogenic lesion in the endometrial fundus, possibly reflecting an endometrial polyp. Consider hysteroscopy and/or endometrial sampling.  Prominent soft tissue in the region of the cervix. Consider direct visualization and/or Pap smear.  Electronically Signed: By: Julian Hy M.D. On: 03/11/2015 13:24   US Pelvis Complete  03/11/2015   ADDENDUM REPORT: 03/11/2015 14:49 ADDENDUM: Dictation error in the impression of the original report. The simple ovarian cysts are located in the RIGHT ovary, as corrected described in the findings section. The LEFT ovary is within normal limits. The remainder of the report is unchanged. Electronically Signed   By: Julian Hy M.D.   On: 03/11/2015 14:49  03/11/2015   CLINICAL DATA:  Left lower quadrant pain  EXAM: TRANSABDOMINAL AND TRANSVAGINAL ULTRASOUND OF PELVIS  TECHNIQUE: Both transabdominal and transvaginal ultrasound examinations of the pelvis were performed. Transabdominal technique was performed for global imaging of the pelvis including uterus, ovaries, adnexal regions, and pelvic cul-de-sac. It was necessary to proceed with endovaginal exam following the transabdominal exam to visualize the endometrium.  COMPARISON:  11/27/2014  FINDINGS: Uterus  Measurements: 10.1 x 5.0 x 5.5 cm. No fibroids or other mass visualized. Prominent soft tissue in the region of the cervix.  Endometrium  Thickness: 12 mm. 13 x 10 x 9 mm echogenic lesion in the endometrial fundus.  Right ovary  Measurements: 5.4 x 3.3 x 3.8 cm. Three simple cysts measuring up to 4.1 x 2.6 x 2.2 cm.  Left ovary  Measurements: 2.8 x 1.7 x 2.6 cm. Normal appearance/no adnexal mass.  Other findings  No free fluid.  IMPRESSION: Three simple left ovarian cyst measuring up to 4.1 cm, likely physiologic.  13 mm echogenic lesion in the endometrial fundus, possibly reflecting an endometrial polyp. Consider hysteroscopy and/or  endometrial sampling.  Prominent soft tissue in the region of the cervix. Consider direct visualization and/or Pap smear.  Electronically Signed: By: Julian Hy M.D. On: 03/11/2015 13:24   US Renal  03/11/2015   CLINICAL DATA:  Hematuria.  Lower abdominal pain and  back pain.  EXAM: RENAL / URINARY TRACT ULTRASOUND COMPLETE  COMPARISON:  None.  FINDINGS: Right Kidney:  Length: 10.2 cm. Echogenicity within normal limits. No mass or hydronephrosis visualized.  Left Kidney:  Length: 10.3 cm. Echogenicity within normal limits. No mass or hydronephrosis visualized.  Bladder:  Appears normal for degree of bladder distention.  IMPRESSION: Normal renal sonogram   Electronically Signed   By: Kerby Moors M.D.   On: 03/11/2015 13:21  [place] tech states the three simple ovarian cysts are on right, not left She states cervical mass looks a bit like fibroid   Assessment and Plan  A:  Lower abdominal/pelvic pain      Cervical mass, possibly fibroid, reproduces abdominal pain when pressed       Right ovarian simple cysts x 3       Fundal uterine endometrial polypoid mass      No leukocytosis      Microscopic hematuria   P:  Discussed findings      WIll refer to clinic with MD for evaluation of endometrial and cervical masses      Rx Tramadol and ibuprofen for pain      Urine to culture         [facility] 03/11/2015, 11:48 AM

## 2015-03-11 NOTE — MAU Note (Signed)
Pain in lower abd, now radiates around to back on left side. Started yesterday.   Frequency, urgency and pain associated with urination.  Neg CVA tenderness

## 2015-03-21 ENCOUNTER — Ambulatory Visit (INDEPENDENT_AMBULATORY_CARE_PROVIDER_SITE_OTHER): Payer: Medicaid Other | Admitting: Obstetrics & Gynecology

## 2015-03-21 ENCOUNTER — Encounter: Payer: Self-pay | Admitting: Obstetrics & Gynecology

## 2015-03-21 VITALS — BP 137/86 | HR 68 | Temp 98.0°F | Ht 64.0 in | Wt 247.6 lb

## 2015-03-21 DIAGNOSIS — G8929 Other chronic pain: Secondary | ICD-10-CM

## 2015-03-21 DIAGNOSIS — R102 Pelvic and perineal pain: Principal | ICD-10-CM

## 2015-03-21 DIAGNOSIS — N949 Unspecified condition associated with female genital organs and menstrual cycle: Secondary | ICD-10-CM

## 2015-03-21 MED ORDER — HYDROCODONE-ACETAMINOPHEN 5-325 MG PO TABS: 1.0000 | ORAL_TABLET | Freq: Four times a day (QID) | ORAL | Status: DC | PRN

## 2015-03-21 MED ORDER — NORGESTIMATE-ETH ESTRADIOL 0.25-35 MG-MCG PO TABS: 1.0000 | ORAL_TABLET | Freq: Every day | ORAL | Status: DC

## 2015-03-21 MED ORDER — IBUPROFEN 600 MG PO TABS: 600.0000 mg | ORAL_TABLET | Freq: Four times a day (QID) | ORAL | Status: DC | PRN

## 2015-03-21 MED ORDER — TAMSULOSIN HCL 0.4 MG PO CAPS: 0.4000 mg | ORAL_CAPSULE | Freq: Every day | ORAL | Status: DC

## 2015-03-21 NOTE — Progress Notes (Signed)
CLINIC ENCOUNTER NOTE  History:  29 y.o. [id number] here today for evaluation of left sided abdominal pain for several months.  She was seen in MAU on 03/11/15 and evaluated and found to have right ovarian cysts, endometrial polyp and cervical cyst/fibroid.  UA showed moderate blood, no sign of UTI, normal renal sonogram.  She was sent here for further discussion. Still reports having LLQ pain, attributes this to her cervical cyst; reports recurrent bilateral ovarian cysts.  She denies any abnormal vaginal discharge, bleeding  or other concerns.   Past Medical History  Diagnosis Date  . Preterm labor   . Gonorrhea   . Abnormal Pap smear     Colpo>normal  . Urinary tract infection   . Ovarian cyst   . Neuromuscular disorder ([place])     diagnosed with MS this visit  . Multiple sclerosis ([place])   . Obesity   . Pernicious anemia 05/02/2013    Past Surgical History  Procedure Laterality Date  . Tubal ligation      2012  . Colposcopy      The following portions of the patient's history were reviewed and updated as appropriate: allergies, current medications, past family history, past medical history, past social history, past surgical history and problem list.   Health Maintenance:  ASCUS pap and negative HRHPV on 05/24/13.    Review of Systems:  Pertinent items noted in HPI and remainder of comprehensive ROS otherwise negative.  Objective:  Physical Exam BP 137/86 mmHg  Pulse 68  Temp(Src) 98 F (36.7 C)  Ht 5\' 4"  (1.626 m)  Wt 247 lb 9.6 oz (112.311 kg)  BMI 42.48 kg/m2  LMP 02/23/2015 CONSTITUTIONAL: Well-developed, well-nourished female in no acute distress.  HENT:  Normocephalic, atraumatic. External right and left ear normal. Oropharynx is clear and moist EYES: Conjunctivae and EOM are normal. Pupils are equal, round, and reactive to light. No scleral icterus.  NECK: Normal range of motion, supple, no masses SKIN: Skin is warm and dry. No rash noted. Not diaphoretic. No  erythema. No pallor. [place]: Alert and oriented to person, place, and time. Normal reflexes, muscle tone coordination. No cranial nerve deficit noted. PSYCHIATRIC: Normal mood and affect. Normal behavior. Normal judgment and thought content. CARDIOVASCULAR: Normal heart rate noted RESPIRATORY: Effort and breath sounds normal, no problems with respiration noted ABDOMEN: Soft, no distention noted, pain is in mid left abdomen radiating to back/flank pain. No pain in suprapubic or adnexal area.    PELVIC: Deferred MUSCULOSKELETAL: Normal range of motion. No edema noted.  Labs and Imaging US Transvaginal Non-ob  03/11/2015   ADDENDUM REPORT: 03/11/2015 14:49 ADDENDUM: Dictation error in the impression of the original report. The simple ovarian cysts are located in the RIGHT ovary, as corrected described in the findings section. The LEFT ovary is within normal limits. The remainder of the report is unchanged. Electronically Signed   By: Julian Hy M.D.   On: 03/11/2015 14:49  03/11/2015   CLINICAL DATA:  Left lower quadrant pain  EXAM: TRANSABDOMINAL AND TRANSVAGINAL ULTRASOUND OF PELVIS  TECHNIQUE: Both transabdominal and transvaginal ultrasound examinations of the pelvis were performed. Transabdominal technique was performed for global imaging of the pelvis including uterus, ovaries, adnexal regions, and pelvic cul-de-sac. It was necessary to proceed with endovaginal exam following the transabdominal exam to visualize the endometrium.  COMPARISON:  11/27/2014  FINDINGS: Uterus  Measurements: 10.1 x 5.0 x 5.5 cm. No fibroids or other mass visualized. Prominent soft tissue in the region of the  cervix.  Endometrium  Thickness: 12 mm. 13 x 10 x 9 mm echogenic lesion in the endometrial fundus.  Right ovary  Measurements: 5.4 x 3.3 x 3.8 cm. Three simple cysts measuring up to 4.1 x 2.6 x 2.2 cm.  Left ovary  Measurements: 2.8 x 1.7 x 2.6 cm. Normal appearance/no adnexal mass.  Other findings  No free  fluid.  IMPRESSION: Three simple left ovarian cyst measuring up to 4.1 cm, likely physiologic.  13 mm echogenic lesion in the endometrial fundus, possibly reflecting an endometrial polyp. Consider hysteroscopy and/or endometrial sampling.  Prominent soft tissue in the region of the cervix. Consider direct visualization and/or Pap smear.  Electronically Signed: By: Julian Hy M.D. On: 03/11/2015 13:24   US Pelvis Complete  03/11/2015   ADDENDUM REPORT: 03/11/2015 14:49 ADDENDUM: Dictation error in the impression of the original report. The simple ovarian cysts are located in the RIGHT ovary, as corrected described in the findings section. The LEFT ovary is within normal limits. The remainder of the report is unchanged. Electronically Signed   By: Julian Hy M.D.   On: 03/11/2015 14:49  03/11/2015   CLINICAL DATA:  Left lower quadrant pain  EXAM: TRANSABDOMINAL AND TRANSVAGINAL ULTRASOUND OF PELVIS  TECHNIQUE: Both transabdominal and transvaginal ultrasound examinations of the pelvis were performed. Transabdominal technique was performed for global imaging of the pelvis including uterus, ovaries, adnexal regions, and pelvic cul-de-sac. It was necessary to proceed with endovaginal exam following the transabdominal exam to visualize the endometrium.  COMPARISON:  11/27/2014  FINDINGS: Uterus  Measurements: 10.1 x 5.0 x 5.5 cm. No fibroids or other mass visualized. Prominent soft tissue in the region of the cervix.  Endometrium  Thickness: 12 mm. 13 x 10 x 9 mm echogenic lesion in the endometrial fundus.  Right ovary  Measurements: 5.4 x 3.3 x 3.8 cm. Three simple cysts measuring up to 4.1 x 2.6 x 2.2 cm.  Left ovary  Measurements: 2.8 x 1.7 x 2.6 cm. Normal appearance/no adnexal mass.  Other findings  No free fluid.  IMPRESSION: Three simple left ovarian cyst measuring up to 4.1 cm, likely physiologic.  13 mm echogenic lesion in the endometrial fundus, possibly reflecting an endometrial polyp. Consider  hysteroscopy and/or endometrial sampling.  Prominent soft tissue in the region of the cervix. Consider direct visualization and/or Pap smear.  Electronically Signed: By: Julian Hy M.D. On: 03/11/2015 13:24   US Renal  03/11/2015   CLINICAL DATA:  Hematuria.  Lower abdominal pain and back pain.  EXAM: RENAL / URINARY TRACT ULTRASOUND COMPLETE  COMPARISON:  None.  FINDINGS: Right Kidney:  Length: 10.2 cm. Echogenicity within normal limits. No mass or hydronephrosis visualized.  Left Kidney:  Length: 10.3 cm. Echogenicity within normal limits. No mass or hydronephrosis visualized.  Bladder:  Appears normal for degree of bladder distention.  IMPRESSION: Normal renal sonogram   Electronically Signed   By: Kerby Moors M.D.   On: 03/11/2015 13:21    Assessment & Plan:  Chronic female pelvic pain Unlikely related to uterus, adnexa or cervix given location and radiation. Concerned about possible small kidney stone especially given persistent hematuria over several months (upon review of UAs in EPIC) vs inflammatory process in pelvis/abdomen (endometriosis, IBS, IC, PID etc). Will give analgesia for now and tamsulosin (kidney stones) to see if it helps her symptoms.  Also will do trial of OCPs (Sprintec). - HYDROcodone-acetaminophen (NORCO/VICODIN) 5-325 MG tablet; Take 1 tablet by mouth every 6 (six) hours as needed.  Dispense: 30 tablet; Refill: 0 - ibuprofen (ADVIL,MOTRIN) 600 MG tablet; Take 1 tablet (600 mg total) by mouth every 6 (six) hours as needed.  Dispense: 60 tablet; Refill: 2 - tamsulosin (FLOMAX) 0.4 MG CAPS capsule; Take 1 capsule (0.4 mg total) by mouth daily.  Dispense: 30 capsule; Refill: 2 - norgestimate-ethinyl estradiol (ORTHO-CYCLEN,SPRINTEC,PREVIFEM) 0.25-35 MG-MCG tablet; Take 1 tablet by mouth daily.  Dispense: 1 Package; Refill: 11 If pain worsens, she may need laparoscopic evaluation, and/or evaluation for IC or IBS Return in about 1 month (around 04/21/2015) for  reevaluation  Routine preventative health maintenance measures emphasized. Please refer to After Visit Summary for other counseling recommendations.    Total face-to-face time with patient: 20 minutes. Over 50% of encounter was spent on counseling and coordination of care.   Verita Schneiders, MD, [place] Attending Obstetrician & Gynecologist, [place] for [facility]

## 2015-03-21 NOTE — Patient Instructions (Signed)

## 2015-03-22 ENCOUNTER — Encounter: Payer: Self-pay | Admitting: Obstetrics & Gynecology

## 2015-03-25 ENCOUNTER — Encounter: Payer: Medicaid Other | Admitting: Obstetrics & Gynecology

## 2015-03-28 ENCOUNTER — Encounter: Payer: Medicaid Other | Admitting: Obstetrics & Gynecology

## 2015-05-01 ENCOUNTER — Ambulatory Visit (INDEPENDENT_AMBULATORY_CARE_PROVIDER_SITE_OTHER): Payer: Medicaid Other | Admitting: Obstetrics & Gynecology

## 2015-05-01 ENCOUNTER — Encounter: Payer: Self-pay | Admitting: Obstetrics & Gynecology

## 2015-05-01 VITALS — BP 108/54 | HR 60 | Temp 98.4°F | Resp 20 | Ht 64.0 in | Wt 245.1 lb

## 2015-05-01 DIAGNOSIS — R198 Other specified symptoms and signs involving the digestive system and abdomen: Secondary | ICD-10-CM

## 2015-05-01 DIAGNOSIS — R109 Unspecified abdominal pain: Secondary | ICD-10-CM | POA: Diagnosis not present

## 2015-05-01 NOTE — Patient Instructions (Signed)
Return to clinic for any scheduled appointments or for any gynecologic concerns as needed.   

## 2015-05-01 NOTE — Progress Notes (Signed)
Pt states she took only one month supply of OCP because they made her feel hungry and she is trying to lose weight. Also they were not helping with the pain or bleeding problem.

## 2015-05-01 NOTE — Progress Notes (Signed)
   CLINIC ENCOUNTER NOTE  History:  29 y.o. [id number] here today for follow up of abdominal pain; was last seen on 03/21/15.  She reports continued left sided abdominal pain radiating to her left flank and  Back.  Did trial of OCPs and Tamsulosin for possible GYN or GU etiologies; they did not help with pain.  Of note, had normal pelvic ultrasound and renal ultrasound.  She now reports associated nausea, belching ans reflux-like symptoms.  Also reports left sided pain that occurs after meals, but can occur before meals.  Had some constipation and diarrhea.  She is concerned about possible ulcer or  "something else wrong with my stomach".  She denies any abnormal vaginal discharge, bleeding, pelvic pain or other concerns.   Past Medical History  Diagnosis Date  . Preterm labor   . Gonorrhea   . Abnormal Pap smear     Colpo>normal  . Urinary tract infection   . Ovarian cyst   . Neuromuscular disorder ([place])     diagnosed with MS this visit  . Multiple sclerosis ([place])   . Obesity   . Pernicious anemia 05/02/2013    Past Surgical History  Procedure Laterality Date  . Tubal ligation      2012  . Colposcopy      The following portions of the patient's history were reviewed and updated as appropriate: allergies, current medications, past family history, past medical history, past social history, past surgical history and problem list.    Review of Systems:  Pertinent items noted in HPI and remainder of comprehensive ROS otherwise negative.  Objective:  Physical Exam BP 108/54 mmHg  Pulse 60  Temp(Src) 98.4 F (36.9 C) (Oral)  Resp 20  Ht 5\' 4"  (1.626 m)  Wt 245 lb 1.6 oz (111.177 kg)  BMI 42.05 kg/m2  LMP 03/25/2015 (Exact Date) CONSTITUTIONAL: Well-developed, well-nourished female in no acute distress.  HENT:  Normocephalic, atraumatic. External right and left ear normal. Oropharynx is clear and moist EYES: Conjunctivae and EOM are normal. Pupils are equal, round, and reactive to  light. No scleral icterus.  NECK: Normal range of motion, supple, no masses SKIN: Skin is warm and dry. No rash noted. Not diaphoretic. No erythema. No pallor. [place]: Alert and oriented to person, place, and time. Normal reflexes, muscle tone coordination. No cranial nerve deficit noted. PSYCHIATRIC: Normal mood and affect. Normal behavior. Normal judgment and thought content. CARDIOVASCULAR: Normal heart rate noted RESPIRATORY: Effort and breath sounds normal, no problems with respiration noted ABDOMEN: Soft, no distention noted, pain is in mid left abdomen radiating to back/flank pain. No pain in suprapubic or adnexal area. PELVIC: Deferred MUSCULOSKELETAL: Normal range of motion. No edema noted.   Assessment & Plan:   GI symptoms - Ambulatory referral to Gastroenterology ordered, but we could not do this referral due to insurance restrictions. Patient will have PCP refer her to GI. In the meantime, she can be evaluated for H.pylori, ulcers, irritable bowel syndrome or other GI pathology by her PCP or GI physician.  There are no acute GYN concerns. Routine preventative health maintenance measures emphasized. Please refer to After Visit Summary for other counseling recommendations.    Total face-to-face time with patient: 25 minutes. Over 50% of encounter was spent on counseling and coordination of care.   Verita Schneiders, MD, [place] Attending Obstetrician & Gynecologist, [place] for [facility]

## 2015-05-19 ENCOUNTER — Emergency Department (HOSPITAL_COMMUNITY)
Admission: EM | Admit: 2015-05-19 | Discharge: 2015-05-19 | Disposition: A | Discharge status: Home / Self Care | Payer: Medicaid Other | Attending: Emergency Medicine | Admitting: Emergency Medicine

## 2015-05-19 ENCOUNTER — Encounter (HOSPITAL_COMMUNITY): Payer: Self-pay | Admitting: Emergency Medicine

## 2015-05-19 DIAGNOSIS — Z8619 Personal history of other infectious and parasitic diseases: Secondary | ICD-10-CM | POA: Insufficient documentation

## 2015-05-19 DIAGNOSIS — R202 Paresthesia of skin: Secondary | ICD-10-CM | POA: Diagnosis not present

## 2015-05-19 DIAGNOSIS — Z862 Personal history of diseases of the blood and blood-forming organs and certain disorders involving the immune mechanism: Secondary | ICD-10-CM | POA: Diagnosis not present

## 2015-05-19 DIAGNOSIS — Z79899 Other long term (current) drug therapy: Secondary | ICD-10-CM | POA: Diagnosis not present

## 2015-05-19 DIAGNOSIS — Z87440 Personal history of urinary (tract) infections: Secondary | ICD-10-CM | POA: Insufficient documentation

## 2015-05-19 DIAGNOSIS — E669 Obesity, unspecified: Secondary | ICD-10-CM | POA: Diagnosis not present

## 2015-05-19 DIAGNOSIS — H9203 Otalgia, bilateral: Secondary | ICD-10-CM

## 2015-05-19 DIAGNOSIS — H9319 Tinnitus, unspecified ear: Secondary | ICD-10-CM | POA: Diagnosis not present

## 2015-05-19 DIAGNOSIS — G35 Multiple sclerosis: Secondary | ICD-10-CM | POA: Insufficient documentation

## 2015-05-19 DIAGNOSIS — Z8742 Personal history of other diseases of the female genital tract: Secondary | ICD-10-CM | POA: Diagnosis not present

## 2015-05-19 LAB — I-STAT CHEM 8, ED
BUN: 12 mg/dL (ref 6–20)
CALCIUM ION: 1.18 mmol/L (ref 1.12–1.23)
CHLORIDE: 101 mmol/L (ref 101–111)
Creatinine, Ser: 1 mg/dL (ref 0.44–1.00)
GLUCOSE: 88 mg/dL (ref 65–99)
HCT: 39 % (ref 36.0–46.0)
Hemoglobin: 13.3 g/dL (ref 12.0–15.0)
Potassium: 4 mmol/L (ref 3.5–5.1)
SODIUM: 138 mmol/L (ref 135–145)
TCO2: 28 mmol/L (ref 0–100)

## 2015-05-19 LAB — I-STAT BETA HCG BLOOD, ED (MC, WL, AP ONLY): I-stat hCG, quantitative: <5 m[IU]/mL (ref <5)

## 2015-05-19 MED ORDER — METHYLPREDNISOLONE SODIUM SUCC 125 MG IJ SOLR: 125.0000 mg | Freq: Once | INTRAMUSCULAR | Status: DC

## 2015-05-19 MED ORDER — METHYLPREDNISOLONE SODIUM SUCC 125 MG IJ SOLR
125.0000 mg | Freq: Once | INTRAMUSCULAR | Status: DC
Start: 2015-05-19 — End: 2015-05-19

## 2015-05-19 MED ORDER — METHYLPREDNISOLONE SODIUM SUCC 125 MG IJ SOLR
125.0000 mg | Freq: Once | INTRAMUSCULAR | Status: AC
  Administered 2015-05-19: 125 mg via INTRAMUSCULAR
  Filled 2015-05-19: qty 2

## 2015-05-19 NOTE — ED Provider Notes (Signed)
CSN: [id number]     Arrival date & time 05/19/15  0003 History   First MD Initiated Contact with Patient 05/19/15 [phone]     Chief Complaint  Patient presents with  . Otalgia     (Consider location/radiation/quality/duration/timing/severity/associated sxs/prior Treatment) HPI Comments: Patient is a 29 year old female with a history of multiple sclerosis. She presents to the emergency Department complaining of paresthesias from her elbow was and knees distally which began yesterday followed by onset of burning in her bilateral ears. Patient denies any trauma or injury. No fever, drainage from her ears, hearing loss, headache, vision changes, difficulty speaking or swallowing, neck stiffness, or extremity numbness or weakness. Patient states that her symptoms feel consistent with her past MS flares. She is followed by neurology at [facility]. No medications taken prior to arrival for symptoms. She reports that she usually has some improvement after receiving a steroid shot.  Patient is a 29 y.o. female presenting with ear pain. The history is provided by the patient. No language interpreter was used.  Otalgia Associated symptoms: tinnitus   Associated symptoms: no ear discharge, no fever, no headaches, no hearing loss and no vomiting     Past Medical History  Diagnosis Date  . Preterm labor   . Gonorrhea   . Abnormal Pap smear     Colpo>normal  . Urinary tract infection   . Ovarian cyst   . Neuromuscular disorder ([place])     diagnosed with MS this visit  . Multiple sclerosis ([place])   . Obesity   . Pernicious anemia 05/02/2013   Past Surgical History  Procedure Laterality Date  . Tubal ligation      2012  . Colposcopy     Family History  Problem Relation Age of Onset  . Anesthesia problems Neg Hx   . Other Neg Hx   . Diabetes Mother   . Hypertension Mother   . Diabetes Father   . Hypertension Father    Social History  Substance Use Topics  . Smoking status:  Never Smoker   . Smokeless tobacco: Never Used  . Alcohol Use: No     Comment:     OB History    Gravida Para Term Preterm AB TAB SAB Ectopic Multiple Living   6 3 1 2 3 3   1 4       Review of Systems  Constitutional: Negative for fever.  HENT: Positive for ear pain and tinnitus. Negative for ear discharge and hearing loss.   Gastrointestinal: Negative for nausea and vomiting.  Neurological: Negative for numbness and headaches.       +paresthesias b/l  All other systems reviewed and are negative.   Allergies  Review of patient's allergies indicates no known allergies.  Home Medications   Prior to Admission medications   Medication Sig Start Date End Date Taking? Authorizing Provider  gabapentin (NEURONTIN) 300 MG capsule Take 300 mg by mouth 3 (three) times daily.    Yes Historical Provider, MD  TECFIDERA 240 MG CPDR TAKE 1 CAPSULE BY MOUTH TWICE A DAY FOR 30 DAYS 02/06/15  Yes Historical Provider, MD  HYDROcodone-acetaminophen (NORCO/VICODIN) 5-325 MG tablet Take 1 tablet by mouth every 6 (six) hours as needed. Patient not taking: Reported on 05/01/2015 03/21/15   Osborne Oman, MD  ibuprofen (ADVIL,MOTRIN) 600 MG tablet Take 1 tablet (600 mg total) by mouth every 6 (six) hours as needed. Patient not taking: Reported on 05/01/2015 03/21/15   Osborne Oman, MD  norgestimate-ethinyl estradiol (ORTHO-CYCLEN,SPRINTEC,PREVIFEM) 0.25-35 MG-MCG tablet Take 1 tablet by mouth daily. Patient not taking: Reported on 05/01/2015 03/21/15   Osborne Oman, MD  tamsulosin (FLOMAX) 0.4 MG CAPS capsule Take 1 capsule (0.4 mg total) by mouth daily. Patient not taking: Reported on 05/01/2015 03/21/15   Osborne Oman, MD  traMADol (ULTRAM) 50 MG tablet Take 1 tablet (50 mg total) by mouth every 6 (six) hours as needed. Patient not taking: Reported on 05/01/2015 03/11/15   Seabron Spates, CNM   BP 121/76 mmHg  Pulse 70  Temp(Src) 97.8 F (36.6 C) (Oral)  Resp 20  Ht 5\' 4"  (1.626 m)   Wt 110.848 kg  BMI 41.93 kg/m2  SpO2 98%  LMP 04/25/2015   Physical Exam  Constitutional: She is oriented to person, place, and time. She appears well-developed and well-nourished. No distress.  Nontoxic/nonseptic appearing  HENT:  Head: Normocephalic and atraumatic.  Right Ear: Tympanic membrane, external ear and ear canal normal.  Left Ear: Tympanic membrane, external ear and ear canal normal.  Mouth/Throat: Uvula is midline and oropharynx is clear and moist.  Eyes: Conjunctivae and EOM are normal. Pupils are equal, round, and reactive to light. No scleral icterus.  Neck: Normal range of motion.  No nuchal rigidity or meningismus  Pulmonary/Chest: Effort normal. No respiratory distress.  Respirations even and unlabored  Musculoskeletal: Normal range of motion.  Neurological: She is alert and oriented to person, place, and time. No cranial nerve deficit. She exhibits normal muscle tone. Coordination normal.  GCS 15. Speech is goal oriented. No cranial nerve deficits appreciated; symmetric eyebrow raise, no facial drooping, tongue midline. Patient has equal grip strength bilaterally. Strength against resistance 5/5 in all major muscle groups bilaterally. Sensation to light touch intact. Patient ambulatory with steady gait.  Skin: Skin is warm and dry. No rash noted. She is not diaphoretic. No erythema. No pallor.  Psychiatric: She has a normal mood and affect. Her behavior is normal.  Nursing note and vitals reviewed.   ED Course  Procedures (including critical care time) Labs Review Labs Reviewed  I-STAT CHEM 8, ED  I-STAT BETA HCG BLOOD, ED ([facility], [facility], [facility] ONLY)    Imaging Review No results found.   I have personally reviewed and evaluated these images and lab results as part of my medical decision-making.   EKG Interpretation None      MDM   Final diagnoses:  Multiple sclerosis ([place])  Otalgia of both ears    29 year old female presents to the emergency department for  evaluation of paresthesias with burning otalgia. Symptoms began yesterday. Patient reports that symptoms are consistent with flare of multiple sclerosis. Patient has a nonfocal neurologic exam today. No concern for otitis media. No nuchal rigidity or meningismus or fever to suggest meningitis. Patient given Solu-Medrol. Will refer to her neurologist. Return precautions given at discharge. Patient discharged in satisfactory condition with no unaddressed concerns.   Filed Vitals:   05/19/15 0006 05/19/15 0130 05/19/15 0145 05/19/15 0200  BP: 136/89 110/75 109/80 121/76  Pulse: 109 73 65 70  Temp: 97.8 F (36.6 C)     TempSrc: Oral     Resp: 20     Height: 5\' 4"  (1.626 m)     Weight: 110.848 kg     SpO2: 100% 100% 98% 98%      Antonietta Breach, PA-C 05/19/15 0249  Ezequiel Essex, MD 05/19/15 [phone]

## 2015-05-19 NOTE — ED Notes (Signed)
Pt. reports bilateral ear ache onset yesterday , denies injury , no drainage or hearing loss.

## 2015-05-19 NOTE — Discharge Instructions (Signed)
Multiple Sclerosis °Multiple sclerosis (MS) is a disease of the central nervous system. It leads to the loss of the insulating covering of the nerves (myelin sheath) of your brain. When this happens, brain signals do not get sent properly or may not get sent at all. The age of onset of MS varies.  °CAUSES °The cause of MS is unknown. However, it is more common in the northern [place] than in the southern [place]. °RISK FACTORS °There is a higher number of women with MS than men. MS is not an illness that is passed down to you from your family members (inherited). However, your risk of MS is higher if you have a relative with MS. °SIGNS AND SYMPTOMS  °The symptoms of MS occur in episodes or attacks. These attacks may last weeks to months. There may be long periods of almost no symptoms between attacks. The symptoms of MS vary. This is because of the many different ways it affects the central nervous system. The main symptoms of MS include: °· Vision problems and eye pain. °· Numbness. °· Weakness. °· Inability to move your arms, hands, feet, or legs (paralysis). °· Balance problems. °· Tremors. °DIAGNOSIS  °Your health care provider can diagnose MS with the help of imaging exams and lab tests. These may include specialized X-ray exams and spinal fluid tests. The best imaging exam to confirm a diagnosis of MS is an MRI. °TREATMENT  °There is no known cure for MS, but there are medicines that can decrease the number and frequency of attacks. Steroids are often used for short-term relief. Physical and occupational therapy may also help. There are also many new alternative or complementary treatments available to help control the symptoms of MS. Ask your health care provider if any of these other options are right for you. °HOME CARE INSTRUCTIONS  °· Take medicines as directed by your health care provider. °· Exercise as directed by your health care provider. °SEEK MEDICAL CARE IF: °You begin to feel  depressed. °SEEK IMMEDIATE MEDICAL CARE IF: °· You develop paralysis. °· You have problems with bladder, bowel, or sexual function. °· You develop mental changes, such as forgetfulness or mood swings. °· You have a period of uncontrolled movements (seizure). °  °This information is not intended to replace advice given to you by your health care provider. Make sure you discuss any questions you have with your health care provider. °  °Document Released: 05/29/2000 Document Revised: 06/06/2013 Document Reviewed: 02/06/2013 °Elsevier Interactive Patient Education ©2016 Elsevier Inc. ° °

## 2015-07-08 ENCOUNTER — Emergency Department (HOSPITAL_COMMUNITY)
Admission: EM | Admit: 2015-07-08 | Discharge: 2015-07-08 | Disposition: A | Discharge status: Home / Self Care | Payer: Medicaid Other | Attending: Emergency Medicine | Admitting: Emergency Medicine

## 2015-07-08 ENCOUNTER — Encounter (HOSPITAL_COMMUNITY): Payer: Self-pay

## 2015-07-08 DIAGNOSIS — E669 Obesity, unspecified: Secondary | ICD-10-CM | POA: Diagnosis not present

## 2015-07-08 DIAGNOSIS — Z8751 Personal history of pre-term labor: Secondary | ICD-10-CM | POA: Insufficient documentation

## 2015-07-08 DIAGNOSIS — Z8742 Personal history of other diseases of the female genital tract: Secondary | ICD-10-CM | POA: Diagnosis not present

## 2015-07-08 DIAGNOSIS — B349 Viral infection, unspecified: Secondary | ICD-10-CM | POA: Insufficient documentation

## 2015-07-08 DIAGNOSIS — Z8619 Personal history of other infectious and parasitic diseases: Secondary | ICD-10-CM | POA: Insufficient documentation

## 2015-07-08 DIAGNOSIS — Z79899 Other long term (current) drug therapy: Secondary | ICD-10-CM | POA: Diagnosis not present

## 2015-07-08 DIAGNOSIS — R1110 Vomiting, unspecified: Secondary | ICD-10-CM | POA: Diagnosis present

## 2015-07-08 LAB — RAPID STREP SCREEN (MED CTR MEBANE ONLY): Streptococcus, Group A Screen (Direct): NEGATIVE

## 2015-07-08 MED ORDER — ACETAMINOPHEN 325 MG PO TABS
650.0000 mg | ORAL_TABLET | Freq: Once | ORAL | Status: AC
  Administered 2015-07-08: 650 mg via ORAL
  Filled 2015-07-08: qty 2

## 2015-07-08 MED ORDER — OSELTAMIVIR PHOSPHATE 75 MG PO CAPS: 75.0000 mg | ORAL_CAPSULE | Freq: Two times a day (BID) | ORAL | Status: DC

## 2015-07-08 MED ORDER — ONDANSETRON 8 MG PO TBDP: 8.0000 mg | ORAL_TABLET | Freq: Three times a day (TID) | ORAL | Status: DC | PRN

## 2015-07-08 MED ORDER — ONDANSETRON 4 MG PO TBDP
8.0000 mg | ORAL_TABLET | Freq: Once | ORAL | Status: AC
  Administered 2015-07-08: 8 mg via ORAL
  Filled 2015-07-08: qty 2

## 2015-07-08 NOTE — ED Provider Notes (Signed)
CSN: [id number]     Arrival date & time 07/08/15  1030 History   First MD Initiated Contact with Patient 07/08/15 1459     Chief Complaint  Patient presents with  . Emesis  . body aches      (Consider location/radiation/quality/duration/timing/severity/associated sxs/prior Treatment) HPI This is a 30 year old female history of multiple sclerosis and pernicious anemia who presents today with 24 hour history of body aches and sore throat. She also reports some yellowish emesis but no diarrhea. She began having chills and fever after arrival here. She has had her flu shot. She reports no known sick contacts but does have 4 children at home. She has been taking fluids and she denies any abnormal vaginal discharge. Last menstrual cycle was normal and she is status post tubal ligation Past Medical History  Diagnosis Date  . Preterm labor   . Gonorrhea   . Abnormal Pap smear     Colpo>normal  . Urinary tract infection   . Ovarian cyst   . Neuromuscular disorder ([place])     diagnosed with MS this visit  . Multiple sclerosis ([place])   . Obesity   . Pernicious anemia 05/02/2013   Past Surgical History  Procedure Laterality Date  . Tubal ligation      2012  . Colposcopy     Family History  Problem Relation Age of Onset  . Anesthesia problems Neg Hx   . Other Neg Hx   . Diabetes Mother   . Hypertension Mother   . Diabetes Father   . Hypertension Father    Social History  Substance Use Topics  . Smoking status: Never Smoker   . Smokeless tobacco: Never Used  . Alcohol Use: No     Comment:     OB History    Gravida Para Term Preterm AB TAB SAB Ectopic Multiple Living   6 3 1 2 3 3   1 4      Review of Systems  All other systems reviewed and are negative.     Allergies  Review of patient's allergies indicates no known allergies.  Home Medications   Prior to Admission medications   Medication Sig Start Date End Date Taking? Authorizing Provider  gabapentin (NEURONTIN)  300 MG capsule Take 300 mg by mouth 3 (three) times daily.     Historical Provider, MD  HYDROcodone-acetaminophen (NORCO/VICODIN) 5-325 MG tablet Take 1 tablet by mouth every 6 (six) hours as needed. Patient not taking: Reported on 05/01/2015 03/21/15   Osborne Oman, MD  ibuprofen (ADVIL,MOTRIN) 600 MG tablet Take 1 tablet (600 mg total) by mouth every 6 (six) hours as needed. Patient not taking: Reported on 05/01/2015 03/21/15   Osborne Oman, MD  norgestimate-ethinyl estradiol (ORTHO-CYCLEN,SPRINTEC,PREVIFEM) 0.25-35 MG-MCG tablet Take 1 tablet by mouth daily. Patient not taking: Reported on 05/01/2015 03/21/15   Osborne Oman, MD  tamsulosin (FLOMAX) 0.4 MG CAPS capsule Take 1 capsule (0.4 mg total) by mouth daily. Patient not taking: Reported on 05/01/2015 03/21/15   Osborne Oman, MD  TECFIDERA 240 MG CPDR TAKE 1 CAPSULE BY MOUTH TWICE A DAY FOR 30 DAYS 02/06/15   Historical Provider, MD  traMADol (ULTRAM) 50 MG tablet Take 1 tablet (50 mg total) by mouth every 6 (six) hours as needed. Patient not taking: Reported on 05/01/2015 03/11/15   Seabron Spates, CNM   BP 115/68 mmHg  Pulse 93  Temp(Src) 100.9 F (38.3 C) (Oral)  Resp 16  SpO2 99% Physical Exam  Constitutional: She is oriented to person, place, and time. She appears well-developed and well-nourished.  HENT:  Head: Normocephalic and atraumatic.  Right Ear: External ear normal.  Left Ear: External ear normal.  Nose: Nose normal.  Mouth/Throat: Oropharynx is clear and moist.  Eyes: Conjunctivae and EOM are normal. Pupils are equal, round, and reactive to light.  Neck: Normal range of motion. Neck supple.  Cardiovascular: Normal rate, regular rhythm, normal heart sounds and intact distal pulses.   Pulmonary/Chest: Effort normal and breath sounds normal.  Abdominal: Soft. Bowel sounds are normal.  Musculoskeletal: Normal range of motion.  Neurological: She is alert and oriented to person, place, and time. She has  normal reflexes.  Skin: Skin is warm and dry.  Psychiatric: She has a normal mood and affect. Her behavior is normal. Judgment and thought content normal.  Nursing note and vitals reviewed.   ED Course  Procedures (including critical care time) Labs Review Labs Reviewed  RAPID STREP SCREEN (NOT AT [facility])    Imaging Review No results found. I have personally reviewed and evaluated these images and lab results as part of my medical decision-making.   EKG Interpretation None      MDM   Final diagnoses:  Viral syndrome    30 year old female with MS who presents today with fever and chills with sore throat. I am strep screen, anti-emetics, and trial of oral fluid.  Pattricia Boss, MD 07/08/15 [phone]

## 2015-07-08 NOTE — ED Notes (Signed)
Patient here with generalized body aches, fever, chills, and vomiting x 1 day. Sore throat with same

## 2015-07-08 NOTE — Discharge Instructions (Signed)
Influenza like illness.  NO flu tests are obtained in stable patients.  Clinically you have flu and there is some flu in the community, so you will be treated with tamiflu.   Viral Infections A virus is a type of germ. Viruses can cause:  Minor sore throats.  Aches and pains.  Headaches.  Runny nose.  Rashes.  Watery eyes.  Tiredness.  Coughs.  Loss of appetite.  Feeling sick to your stomach (nausea).  Throwing up (vomiting).  Watery poop (diarrhea). HOME CARE   Only take medicines as told by your doctor.  Drink enough water and fluids to keep your pee (urine) clear or pale yellow. Sports drinks are a good choice.  Get plenty of rest and eat healthy. Soups and broths with crackers or rice are fine. GET HELP RIGHT AWAY IF:   You have a very bad headache.  You have shortness of breath.  You have chest pain or neck pain.  You have an unusual rash.  You cannot stop throwing up.  You have watery poop that does not stop.  You cannot keep fluids down.  You or your child has a temperature by mouth above 102 F (38.9 C), not controlled by medicine.  Your baby is older than 3 months with a rectal temperature of 102 F (38.9 C) or higher.  Your baby is 86 months old or younger with a rectal temperature of 100.4 F (38 C) or higher. MAKE SURE YOU:   Understand these instructions.  Will watch this condition.  Will get help right away if you are not doing well or get worse.   This information is not intended to replace advice given to you by your health care provider. Make sure you discuss any questions you have with your health care provider.   Document Released: 05/14/2008 Document Revised: 08/24/2011 Document Reviewed: 11/07/2014 Elsevier Interactive Patient Education 2016 [employer]. Influenza, Adult Influenza (flu) is an infection in the mouth, nose, and throat (respiratory tract) caused by a virus. The flu can make you feel very ill. Influenza spreads  easily from person to person (contagious).  HOME CARE   Only take medicines as told by your doctor.  Use a cool mist humidifier to make breathing easier.  Get plenty of rest until your fever goes away. This usually takes 3 to 4 days.  Drink enough fluids to keep your pee (urine) clear or pale yellow.  Cover your mouth and nose when you cough or sneeze.  Wash your hands well to avoid spreading the flu.  Stay home from work or school until your fever has been gone for at least 1 full day.  Get a flu shot every year. GET HELP RIGHT AWAY IF:   You have trouble breathing or feel short of breath.  Your skin or nails turn blue.  You have severe neck pain or stiffness.  You have a severe headache, facial pain, or earache.  Your fever gets worse or keeps coming back.  You feel sick to your stomach (nauseous), throw up (vomit), or have watery poop (diarrhea).  You have chest pain.  You have a deep cough that gets worse, or you cough up more thick spit (mucus). MAKE SURE YOU:   Understand these instructions.  Will watch your condition.  Will get help right away if you are not doing well or get worse.   This information is not intended to replace advice given to you by your health care provider. Make sure you discuss  any questions you have with your health care provider.   Document Released: 03/10/2008 Document Revised: 06/22/2014 Document Reviewed: 08/31/2011 Elsevier Interactive Patient Education [employer].

## 2015-07-09 LAB — CULTURE, GROUP A STREP (THRC)

## 2015-07-10 ENCOUNTER — Telehealth (HOSPITAL_BASED_OUTPATIENT_CLINIC_OR_DEPARTMENT_OTHER): Payer: Self-pay | Admitting: Emergency Medicine

## 2015-07-10 NOTE — Progress Notes (Signed)
ED Antimicrobial Stewardship Positive Culture Follow Up   [name] is an 30 y.o. female who presented to [facility] on 07/08/2015 with a chief complaint of  Chief Complaint  Patient presents with  . Emesis  . body aches     Recent Results (from the past 720 hour(s))  Rapid strep screen (not at [facility])     Status: None   Collection Time: 07/08/15  3:14 PM  Result Value Ref Range Status   Streptococcus, Group A Screen (Direct) NEGATIVE NEGATIVE Final    Comment: (NOTE) A Rapid Antigen test may result negative if the antigen level in the sample is below the detection level of this test. The FDA has not cleared this test as a stand-alone test therefore the rapid antigen negative result has reflexed to a Group A Strep culture.   Culture, group A strep     Status: None   Collection Time: 07/08/15  3:14 PM  Result Value Ref Range Status   Specimen Description THROAT  Final   Special Requests NONE Reflexed from [id number]  Final   Culture MODERATE GROUP A STREP (S.PYOGENES) ISOLATED  Final   Report Status 07/09/2015 FINAL  Final    [x]  Patient discharged originally without antimicrobial agent and treatment is now indicated  New antibiotic prescription: amoxicillin 1000 mg daily for 10 days  ED Provider: Lenn Sink, PA-C  Melburn Popper, PharmD Clinical Pharmacy Resident Pager: [phone] 07/10/2015 9:15 AM

## 2015-07-10 NOTE — Telephone Encounter (Signed)
Post ED Visit - Positive Culture Follow-up: Successful Patient Follow-Up  Culture assessed and recommendations reviewed by: []  Elenor Quinones, Pharm.D. []  Heide Guile, Pharm.D., BCPS []  Parks Neptune, Pharm.D. []  Alycia Rossetti, Pharm.D., BCPS []  [place], [place].D., BCPS, AAHIVP []  Legrand Como, Pharm.D., BCPS, AAHIVP []  Milus Glazier, Pharm.D. []  Stephens November, Pharm.D.  Positive strep culture  [x]  Patient discharged without antimicrobial prescription and treatment is now indicated []  Organism is resistant to prescribed ED discharge antimicrobial []  Patient with positive blood cultures  Changes discussed with ED provider: Lenn Sink PA New antibiotic prescription Amoxicillin 1000 mg po daily for 10 days  Attempting to notify patient   Contacted patient, 07/10/15 1438   [name] 07/10/2015, 2:33 PM

## 2015-07-13 ENCOUNTER — Telehealth (HOSPITAL_COMMUNITY): Payer: Self-pay

## 2015-07-13 NOTE — Telephone Encounter (Signed)
Pt calling after receiving letter.  Pt informed of dx and need for addl tx.  Rx called to [facility] [phone] and left on VM.

## 2015-08-22 ENCOUNTER — Encounter: Payer: Self-pay | Admitting: Certified Nurse Midwife

## 2015-08-22 ENCOUNTER — Ambulatory Visit (INDEPENDENT_AMBULATORY_CARE_PROVIDER_SITE_OTHER): Payer: Medicare Other | Admitting: Certified Nurse Midwife

## 2015-08-22 VITALS — BP 111/78 | HR 78 | Temp 97.8°F | Ht 64.0 in | Wt 242.0 lb

## 2015-08-22 DIAGNOSIS — N939 Abnormal uterine and vaginal bleeding, unspecified: Secondary | ICD-10-CM | POA: Diagnosis not present

## 2015-08-22 DIAGNOSIS — Z01419 Encounter for gynecological examination (general) (routine) without abnormal findings: Secondary | ICD-10-CM

## 2015-08-22 MED ORDER — TRANEXAMIC ACID 650 MG PO TABS: 1300.0000 mg | ORAL_TABLET | Freq: Three times a day (TID) | ORAL | Status: DC

## 2015-08-22 NOTE — Progress Notes (Signed)
Patient ID: [name], female   DOB: 03/04/1986, 30 y.o.   MRN: [id number]    Subjective:        [name] is a 30 y.o. female here for a routine exam.  Current complaints: chronic abdominal pain for 9 months has been evaluated multiple times in ED, notes reviwed.  No cause found for abdominal pain.  Had normal pelvic US in September of 2016.  She has a hx of BTL in 2012.  States had a pap smear at [facility] and it was normal last year.  Menses are irregular, sometimes every other month, when they come they are lasting 2-4 weeks, with menorrhagia, dysmenorrhea, quarter sized clots at the start of the period.  Uses tampons and uses 2 super plus tampons and changes them every 1-2 hours, for the entire cycle.  Tried OCPs for one month, is concerned with weight gain from the OCPs.  Just saw neurology yesterday for her MS.    Personal health questionnaire:  Is patient Ashkenazi Jewish, have a family history of breast and/or ovarian cancer: no Is there a family history of uterine cancer diagnosed at age < 81, gastrointestinal cancer, urinary tract cancer, family member who is a Field seismologist syndrome-associated carrier: no Is the patient overweight and hypertensive, family history of diabetes, personal history of gestational diabetes, preeclampsia or PCOS: yes Is patient over 80, have PCOS,  family history of premature CHD under age 1, diabetes, smoke, have hypertension or peripheral artery disease:  yes At any time, has a partner hit, kicked or otherwise hurt or frightened you?: no Over the past 2 weeks, have you felt down, depressed or hopeless?: no Over the past 2 weeks, have you felt little interest or pleasure in doing things?:no   Gynecologic History Patient's last menstrual period was 07/25/2015. Contraception: tubal ligation Last Pap: 05/24/13. Results were: abnormal ASCUS, neg HPV Last mammogram: N/A.   Obstetric History OB History  Gravida Para Term Preterm AB SAB TAB Ectopic  Multiple Living  6 3 1 2 3  3  1 4     # Outcome Date GA Lbr Len/2nd Weight Sex Delivery Anes PTL Lv  6 Term           5A Preterm         Y  5B Preterm         Y  4 Preterm           3 TAB           2 TAB           1 TAB               Past Medical History  Diagnosis Date  . Preterm labor   . Gonorrhea   . Abnormal Pap smear     Colpo>normal  . Urinary tract infection   . Ovarian cyst   . Neuromuscular disorder ([place])     diagnosed with MS this visit  . Multiple sclerosis ([place])   . Obesity   . Pernicious anemia 05/02/2013    Past Surgical History  Procedure Laterality Date  . Tubal ligation      2012  . Colposcopy       Current outpatient prescriptions:  .  Dimethyl Fumarate (TECFIDERA) 240 MG CPDR, Take 240 mg by mouth 2 (two) times daily., Disp: , Rfl:  .  gabapentin (NEURONTIN) 400 MG capsule, Take 400 mg by mouth 3 (three) times daily., Disp: , Rfl:  .  ibuprofen (ADVIL,MOTRIN) 600 MG tablet, Take 1 tablet (600 mg total) by mouth every 6 (six) hours as needed., Disp: 60 tablet, Rfl: 2 .  norgestimate-ethinyl estradiol (ORTHO-CYCLEN,SPRINTEC,PREVIFEM) 0.25-35 MG-MCG tablet, Take 1 tablet by mouth daily. (Patient not taking: Reported on 05/01/2015), Disp: 1 Package, Rfl: 11 No Known Allergies  Social History  Substance Use Topics  . Smoking status: Never Smoker   . Smokeless tobacco: Never Used  . Alcohol Use: No     Comment:      Family History  Problem Relation Age of Onset  . Anesthesia problems Neg Hx   . Other Neg Hx   . Diabetes Mother   . Hypertension Mother   . Diabetes Father   . Hypertension Father       Review of Systems  Constitutional: negative for fatigue and weight loss Respiratory: negative for cough and wheezing Cardiovascular: negative for chest pain, fatigue and palpitations Gastrointestinal: negative for abdominal pain and change in bowel habits Musculoskeletal:negative for myalgias Neurological: negative for gait problems and  tremors Behavioral/Psych: negative for abusive relationship, depression Endocrine: negative for temperature intolerance   Genitourinary:negative for abnormal menstrual periods, genital lesions, hot flashes, sexual problems and vaginal discharge Integument/breast: negative for breast lump, breast tenderness, nipple discharge and skin lesion(s)    Objective:       BP 111/78 mmHg  Pulse 78  Temp(Src) 97.8 F (36.6 C)  Ht 5\' 4"  (1.626 m)  Wt 242 lb (109.77 kg)  BMI 41.52 kg/m2  LMP 07/25/2015 General:   alert  Skin:   no rash or abnormalities  Lungs:   clear to auscultation bilaterally  Heart:   regular rate and rhythm, S1, S2 normal, no murmur, click, rub or gallop  Breasts:   normal without suspicious masses, skin or nipple changes or axillary nodes, bilateral nipple piercings  Abdomen:  normal findings: no organomegaly, soft, non-tender and no hernia obese  Pelvis:  External genitalia: normal general appearance, clitoral piercing Urinary system: urethral meatus normal and bladder without fullness, nontender Vaginal: normal without tenderness, induration or masses Cervix: no CMT Adnexa: normal bimanual exam Uterus: anteverted and non-tender, normal size   Lab Review Urine pregnancy test Labs reviewed yes Radiologic studies reviewed yes  50% of 30 min visit spent on counseling and coordination of care.   Assessment:    Healthy female exam.   H/O AUB & dysmenorrhea  Chronic abdominal pain  Plan:    Education reviewed: calcium supplements, depression evaluation, low fat, low cholesterol diet, safe sex/STD prevention, self breast exams, skin cancer screening and weight bearing exercise. Contraception: tubal ligation. Follow up in: 1 month.   No orders of the defined types were placed in this encounter.   No orders of the defined types were placed in this encounter.   Need to obtain previous records Possible management options include: Mirena IUD if we can get her  one.  Educated patient to get GI consult through her primary care provider [facility].  Follow up as needed.

## 2015-08-22 NOTE — Addendum Note (Signed)
Addended by: Bettye Boeck on: 08/22/2015 01:47 PM   Modules accepted: Orders

## 2015-08-22 NOTE — Addendum Note (Signed)
Addended by: Lewie Loron D on: 08/22/2015 01:50 PM   Modules accepted: Orders

## 2015-08-26 LAB — SURESWAB, VAGINOSIS/VAGINITIS PLUS
Atopobium vaginae: NOT DETECTED Log (cells/mL)
C. TROPICALIS, DNA: NOT DETECTED
C. albicans, DNA: NOT DETECTED
C. glabrata, DNA: NOT DETECTED
C. parapsilosis, DNA: NOT DETECTED
C. trachomatis RNA, TMA: NOT DETECTED
GARDNERELLA VAGINALIS: 5.3 Log (cells/mL)
LACTOBACILLUS SPECIES: NOT DETECTED Log (cells/mL)
MEGASPHAERA SPECIES: NOT DETECTED Log (cells/mL)
N. GONORRHOEAE RNA, TMA: NOT DETECTED
T. vaginalis RNA, QL TMA: NOT DETECTED

## 2016-01-18 ENCOUNTER — Encounter (HOSPITAL_COMMUNITY): Payer: Self-pay | Admitting: *Deleted

## 2016-01-18 ENCOUNTER — Emergency Department (HOSPITAL_COMMUNITY)
Admission: EM | Admit: 2016-01-18 | Discharge: 2016-01-18 | Disposition: A | Discharge status: Home / Self Care | Payer: Medicare Other | Attending: Emergency Medicine | Admitting: Emergency Medicine

## 2016-01-18 DIAGNOSIS — R200 Anesthesia of skin: Secondary | ICD-10-CM | POA: Diagnosis present

## 2016-01-18 DIAGNOSIS — G35 Multiple sclerosis: Secondary | ICD-10-CM | POA: Diagnosis not present

## 2016-01-18 LAB — URINALYSIS, ROUTINE W REFLEX MICROSCOPIC
Bilirubin Urine: NEGATIVE
Glucose, UA: NEGATIVE mg/dL
HGB URINE DIPSTICK: NEGATIVE
Ketones, ur: NEGATIVE mg/dL
Leukocytes, UA: NEGATIVE
NITRITE: NEGATIVE
PROTEIN: NEGATIVE mg/dL
SPECIFIC GRAVITY, URINE: 1.024 (ref 1.005–1.030)
pH: 7.5 (ref 5.0–8.0)

## 2016-01-18 LAB — POC URINE PREG, ED: PREG TEST UR: NEGATIVE

## 2016-01-18 MED ORDER — PREDNISONE 10 MG PO TABS: ORAL_TABLET | ORAL | 0 refills | Status: DC

## 2016-01-18 MED ORDER — METHYLPREDNISOLONE SODIUM SUCC 125 MG IJ SOLR
125.0000 mg | Freq: Once | INTRAMUSCULAR | Status: AC
  Administered 2016-01-18: 125 mg via INTRAMUSCULAR
  Filled 2016-01-18: qty 2

## 2016-01-18 NOTE — ED Triage Notes (Signed)
Patient presents with c/o feeling like her skin and everything is burning, eyes feel like they are swelling.  Has history of MS and stated this happens occassionally and needs to receive IV steriods

## 2016-01-18 NOTE — ED Provider Notes (Signed)
[facility] DEPT Provider Note   CSN: [id number] Arrival date & time: 01/18/16  [id number]  First Provider Contact:  First MD Initiated Contact with Patient 01/18/16 2138        History   Chief Complaint Chief Complaint  Patient presents with  . Other    Skin Burning    HPI [name] is a 30 y.o. female.  The history is provided by the patient.  She has a history of multiple sclerosis. She comes in with a feeling like everything on her skin is burning. There is some numbness have her thighs which is chronic. She denies weakness or dizziness. This is typical of her multiple sclerosis flareups. She states that if she gets a shot of a steroid and goes on a steroid taper, symptoms usually subside. Prednisone by itself usually is not effective.  Past Medical History:  Diagnosis Date  . Abnormal Pap smear    Colpo>normal  . Gonorrhea   . Multiple sclerosis ([place])   . Neuromuscular disorder [facility])    diagnosed with MS this visit  . Obesity   . Ovarian cyst   . Pernicious anemia 05/02/2013  . Preterm labor   . Urinary tract infection     Patient Active Problem List   Diagnosis Date Noted  . B12 deficiency 10/04/2013  . Unspecified vitamin D deficiency 10/04/2013  . 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 Adjustment disorder with mixed anxiety and depressed mood 03/13/2013  . Multiple sclerosis ([place]) 03/13/2013  . OBESITY, NOS 08/12/2006    Past Surgical History:  Procedure Laterality Date  . COLPOSCOPY    . TUBAL LIGATION     2012    OB History    Gravida Para Term Preterm AB Living   6 3 1 2 3 4    SAB TAB Ectopic Multiple Live Births     3   1 2        Home Medications    Prior to Admission medications   Medication Sig Start Date End Date Taking? Authorizing Provider  Dimethyl Fumarate (TECFIDERA) 240 MG CPDR Take 240 mg by mouth 2 (two) times daily.    Historical Provider, MD  gabapentin (NEURONTIN) 400 MG capsule Take 400 mg by mouth 3  (three) times daily.    Historical Provider, MD  ibuprofen (ADVIL,MOTRIN) 600 MG tablet Take 1 tablet (600 mg total) by mouth every 6 (six) hours as needed. 03/21/15   Osborne Oman, MD  predniSONE (DELTASONE) 10 MG tablet Take six tablets a day for two days, Then five tablets a day for two days, Then four tablets a day for two days, Then three tablets a day for two days, Then two tablets a day for two days, Then one tablet a day for two days 0000000   Delora Fuel, MD  tranexamic acid (LYSTEDA) 650 MG TABS tablet Take 2 tablets (1,300 mg total) by mouth 3 (three) times daily. 08/22/15   Morene Crocker, CNM    Family History Family History  Problem Relation Age of Onset  . Diabetes Mother   . Hypertension Mother   . Diabetes Father   . Hypertension Father   . Anesthesia problems Neg Hx   . Other Neg Hx     Social History Social History  Substance Use Topics  . Smoking status: Never Smoker  . Smokeless tobacco: Never Used  . Alcohol use No     Comment:       Allergies   Review of  patient's allergies indicates no known allergies.   Review of Systems Review of Systems  All other systems reviewed and are negative.    Physical Exam Updated Vital Signs BP 121/82 (BP Location: Right Arm)   Pulse 90   Temp 97.3 F (36.3 C) (Oral)   Resp 16   Ht 5\' 4"  (1.626 m)   Wt 233 lb (105.7 kg)   LMP 01/06/2016 (Approximate)   SpO2 97%   BMI 39.99 kg/m   Physical Exam  Nursing note and vitals reviewed.  30 year old female, resting comfortably and in no acute distress. Vital signs are normal. Oxygen saturation is 97%, which is normal. Head is normocephalic and atraumatic. PERRLA, EOMI. Oropharynx is clear. Neck is nontender and supple without adenopathy or JVD. Back is nontender and there is no CVA tenderness. Lungs are clear without rales, wheezes, or rhonchi. Chest is nontender. Heart has regular rate and rhythm without murmur. Abdomen is soft, flat, nontender without  masses or hepatosplenomegaly and peristalsis is normoactive. Extremities have no cyanosis or edema, full range of motion is present. Skin is warm and dry without rash. Neurologic: Mental status is normal, cranial nerves are intact, there are no motor or sensory deficits.  ED Treatments / Results  Labs (all labs ordered are listed, but only abnormal results are displayed) Labs Reviewed  URINALYSIS, ROUTINE W REFLEX MICROSCOPIC (NOT AT [facility])  POC URINE PREG, ED    Procedures Procedures (including critical care time)  Medications Ordered in ED Medications  methylPREDNISolone sodium succinate (SOLU-MEDROL) 125 mg/2 mL injection 125 mg (not administered)     Initial Impression / Assessment and Plan / ED Course  I have reviewed the triage vital signs and the nursing notes.  Pertinent labs that were available during my care of the patient were reviewed by me and considered in my medical decision making (see chart for details).  Clinical Course    Exacerbation of multiple sclerosis. Old records are reviewed and she has several ED visits with similar complaints. On review of records on care everywhere, her neurologist treats flareups with prednisone tapers over 2 weeks starting from 60 mg a day and going down by 10 mg a day every 2 days. She is given an injection of methylprednisolone and discharged with prescription for prednisone taper.  Final Clinical Impressions(s) / ED Diagnoses   Final diagnoses:  Exacerbation of multiple sclerosis ([facility])    New Prescriptions New Prescriptions   PREDNISONE (DELTASONE) 10 MG TABLET    Take six tablets a day for two days, Then five tablets a day for two days, Then four tablets a day for two days, Then three tablets a day for two days, Then two tablets a day for two days, Then one tablet a day for two days     Delora Fuel, MD [id number]

## 2016-01-28 ENCOUNTER — Inpatient Hospital Stay (HOSPITAL_COMMUNITY)
Admission: AD | Admit: 2016-01-28 | Discharge: 2016-01-28 | Disposition: A | Discharge status: Home / Self Care | Payer: Medicare Other | Source: Ambulatory Visit | Attending: Family Medicine | Admitting: Family Medicine

## 2016-01-28 ENCOUNTER — Encounter (HOSPITAL_COMMUNITY): Payer: Self-pay | Admitting: *Deleted

## 2016-01-28 ENCOUNTER — Other Ambulatory Visit: Payer: Self-pay | Admitting: Advanced Practice Midwife

## 2016-01-28 DIAGNOSIS — N611 Abscess of the breast and nipple: Secondary | ICD-10-CM

## 2016-01-28 DIAGNOSIS — N63 Unspecified lump in breast: Secondary | ICD-10-CM | POA: Insufficient documentation

## 2016-01-28 LAB — POCT PREGNANCY, URINE: PREG TEST UR: NEGATIVE

## 2016-01-28 MED ORDER — SULFAMETHOXAZOLE-TRIMETHOPRIM 800-160 MG PO TABS: 1.0000 | ORAL_TABLET | Freq: Two times a day (BID) | ORAL | 1 refills | Status: DC

## 2016-01-28 NOTE — MAU Note (Signed)
Breast pain started a few days ago, noted a green d/c.  Breasts are pierced.  Removed ring.  Drainage is not from piercing hole, but from nipple. Yesterday noted a lump behind left nipple.  Very tender to touch.

## 2016-01-28 NOTE — Discharge Instructions (Signed)
Breast Cyst A breast cyst is a sac in the breast that is filled with fluid. Breast cysts are common in women. Women can have one or many cysts. When the breasts contain many cysts, it is usually due to a noncancerous (benign) condition called fibrocystic change. These lumps form under the influence of female hormones (estrogen and progesterone). The lumps are most often located in the upper, outer portion of the breast. They are often more swollen, painful, and tender before your period starts. They usually disappear after menopause, unless you are on hormone therapy.  There are several types of cysts:  Macrocyst. This is a cyst that is about 2 in. (5.1 cm) in diameter.   Microcyst. This is a tiny cyst that you cannot feel but can be seen with a mammogram or an ultrasound.   Galactocele. This is a cyst containing milk that may develop if you suddenly stop breastfeeding.   Sebaceous cyst of the skin. This type of cyst is not in the breast tissue itself. Breast cysts do not increase your risk of breast cancer. However, they must be monitored closely because they can be cancerous.  CAUSES  It is not known exactly what causes a breast cyst to form. Possible causes include:  An overgrowth of milk glands and connective tissue in the breast can block the milk glands, causing them to fill with fluid.   Scar tissue in the breast from previous surgery may block the glands, causing a cyst.  RISK FACTORS Estrogen may influence the development of a breast cyst.  SIGNS AND SYMPTOMS   Feeling a smooth, round, soft lump (like a grape) in the breast that is easily moveable.   Breast discomfort or pain.  Increase in size of the lump before your menstrual period and decrease in its size after your menstrual period.  DIAGNOSIS  A cyst can be felt during a physical exam by your health care provider. A breast X-ray exam (mammogram) and ultrasonography will be done to confirm the diagnosis. Fluid may  be removed from the cyst with a needle (fine needle aspiration) to make sure the cyst is not cancerous.  TREATMENT  Treatment may not be necessary. Your health care provider may monitor the cyst to see if it goes away on its own. If treatment is needed, it may include:  Hormone treatment.   Needle aspiration. There is a chance of the cyst coming back after aspiration.   Surgery to remove the whole cyst.  HOME CARE INSTRUCTIONS   Keep all follow-up appointments with your health care provider.  See your health care provider regularly:  Get a yearly exam by your health care provider.  Have a clinical breast exam by a health care provider every 1-3 years if you are 4-49 years of age. After age 70 years, you should have the exam every year.   Get mammogram tests as directed by your health care provider.   Understand the normal appearance and feel of your breasts and perform breast self-exams.   Only take over-the-counter or prescription medicines as directed by your health care provider.   Wear a supportive bra, especially when exercising.   Avoid caffeine.   Reduce your salt intake, especially before your menstrual period. Too much salt can cause fluid retention, breast swelling, and discomfort.  SEEK MEDICAL CARE IF:   You feel, or think you feel, a lump in your breast.   You notice that both breasts look or feel different than usual.   Your  breast is still causing pain after your menstrual period is over.   You need medicine for breast pain and swelling that occurs with your menstrual period.  SEEK IMMEDIATE MEDICAL CARE IF:   You have severe pain, tenderness, redness, or warmth in your breast.   You have nipple discharge or bleeding.   Your breast lump becomes hard and painful.   You find new lumps or bumps that were not there before.   You feel lumps in your armpit (axilla).   You notice dimpling or wrinkling of the breast or nipple.   You  have a fever.  MAKE SURE YOU:  Understand these instructions.  Will watch your condition.  Will get help right away if you are not doing well or get worse.   This information is not intended to replace advice given to you by your health care provider. Make sure you discuss any questions you have with your health care provider.   Document Released: 06/01/2005 Document Revised: 02/01/2013 Document Reviewed: 12/29/2012 Elsevier Interactive Patient Education [employer].

## 2016-01-28 NOTE — MAU Provider Note (Signed)
Chief Complaint:  Breast Pain and Breast Mass   First Provider Initiated Contact with Patient 01/28/16 0959     HPI:  [name] is a 30 y.o. [id number] who presents to maternity admissions reporting pain, drainage and a lump behind left nipple.  Started with drainage a few days ago, lump appeared yesterday.  States has green discharge from nipple rings from time to time, but this is the first time she had a lump. She reports no vaginal bleeding, vaginal itching/burning, urinary symptoms, h/a, dizziness, n/v, or fever/chills.    Rash  This is a new problem. The current episode started in the past 7 days. The problem has been gradually worsening since onset. The affected locations include the chest (Left breast lump, painful, with green drainage from nipple). The rash is characterized by swelling. She was exposed to nothing. Pertinent negatives include no congestion, cough, fever, joint pain or shortness of breath. Past treatments include nothing. There is no history of allergies.    RN Note: Breast pain started a few days ago, noted a green d/c.  Breasts are pierced.  Removed ring.  Drainage is not from piercing hole, but from nipple. Yesterday noted a lump behind left nipple.  Very tender to touch.   Past Medical History: Past Medical History:  Diagnosis Date  . Abnormal Pap smear    Colpo>normal  . Gonorrhea   . Multiple sclerosis ([place])   . Neuromuscular disorder [facility])    diagnosed with MS this visit  . Obesity   . Ovarian cyst   . Pernicious anemia 05/02/2013  . Preterm labor   . Urinary tract infection   . Vaginal Pap smear, abnormal     Past obstetric history: OB History  Gravida Para Term Preterm AB Living  6 3 1 2 3 4   SAB TAB Ectopic Multiple Live Births    3   1 2     # Outcome Date GA Lbr Len/2nd Weight Sex Delivery Anes PTL Lv  6 Term           5A Preterm         LIV  5B Preterm         LIV  4 Preterm           3 TAB           2 TAB           1 TAB                Past Surgical History: Past Surgical History:  Procedure Laterality Date  . COLPOSCOPY    . TUBAL LIGATION     2012    Family History: Family History  Problem Relation Age of Onset  . Diabetes Mother   . Hypertension Mother   . Diabetes Father   . Hypertension Father   . Anesthesia problems Neg Hx   . Other Neg Hx     Social History: Social History  Substance Use Topics  . Smoking status: Never Smoker  . Smokeless tobacco: Never Used  . Alcohol use No     Comment:      Allergies: No Known Allergies  Meds:  Prescriptions Prior to Admission  Medication Sig Dispense Refill Last Dose  . Daclizumab (ZINBRYTA) 150 MG/ML SOSY Inject into the skin every 30 (thirty) days.   11/18/2015 at am  . gabapentin (NEURONTIN) 400 MG capsule Take 400 mg by mouth 3 (three) times daily.   01/17/2016 at pm  .  ibuprofen (ADVIL,MOTRIN) 600 MG tablet Take 1 tablet (600 mg total) by mouth every 6 (six) hours as needed. (Patient taking differently: Take 600 mg by mouth every 6 (six) hours as needed for mild pain or moderate pain. ) 60 tablet 2 Past Week at Unknown time  . predniSONE (DELTASONE) 10 MG tablet Take six tablets a day for two days, Then five tablets a day for two days, Then four tablets a day for two days, Then three tablets a day for two days, Then two tablets a day for two days, Then one tablet a day for two days 42 tablet 0   . SUMAtriptan (IMITREX) 100 MG tablet Take 1 tablet by mouth as needed for a migraine, do not take more than 3 times a week.   Past Month at Unknown time  . tranexamic acid (LYSTEDA) 650 MG TABS tablet Take 2 tablets (1,300 mg total) by mouth 3 (three) times daily. (Patient not taking: Reported on 01/18/2016) 30 tablet 2 Not Taking at Unknown time    I have reviewed patient's Past Medical Hx, Surgical Hx, Family Hx, Social Hx, medications and allergies.  ROS:  Review of Systems  Constitutional: Negative for fever.  HENT: Negative for congestion.    Respiratory: Negative for cough and shortness of breath.   Genitourinary: Negative for pelvic pain and vaginal bleeding.  Musculoskeletal: Negative for joint pain.  Skin: Positive for rash.       Left breast painful mass, green drainage    Other systems negative  Physical Exam  Patient Vitals for the past 24 hrs:  BP Temp Temp src Pulse Resp  01/28/16 0945 137/87 98.4 F (36.9 C) Oral 72 16   Constitutional: Well-developed, well-nourished female in no acute distress.  Cardiovascular: normal rate and rhythm Respiratory: normal effort, no distress.   Chest wall:   Left breast mass, about 1.5-2cm, just behind nipple, and slightly superior to it, round, firm, tender, mobile, not fluctuant yet                      Unable to express drainage for culture  GI: Abd soft, non-tender.  Nondistended.  No rebound, No guarding.  Bowel Sounds audible  MS: Extremities nontender, no edema, normal ROM Neurologic: Alert and oriented x 4.   Grossly nonfocal. GU: Neg CVAT. Skin:  Warm and Dry Psych:  Affect appropriate.    Labs: Results for orders placed or performed during the hospital encounter of 01/28/16 (from the past 24 hour(s))  Pregnancy, urine POC     Status: None   Collection Time: 01/28/16  9:55 AM  Result Value Ref Range   Preg Test, Ur NEGATIVE NEGATIVE      Imaging:  No results found.  MAU Course/MDM: I have ordered labs as follows:  UPT Imaging ordered: Outpatient breast diagnostic mammogram and ultrasound Results reviewed.   Consult Dr Nehemiah Settle, who recommends antibiotics and imaging.   Treatments in MAU included none.   Pt stable at time of discharge.  Assessment: Left breast mass Probable abscess due to nipple ring  Plan: Discharge home Recommend Keep rings out until resolved Rx sent for Septra DS for breast abscess Unable to express fluid for culture Will schedule outpatient mammogram and ultrasound Will schedule followup in our clinic in 2-4 weeks to check  for resolution   Encouraged to return here or to other Urgent Care/ED if she develops worsening of symptoms, increase in pain, fever, or other concerning symptoms.   Hansel Feinstein  CNM, MSN Certified Nurse-Midwife 01/28/2016 10:01 AM

## 2016-01-30 ENCOUNTER — Ambulatory Visit
Admit: 2016-01-30 | Discharge: 2016-01-30 | Disposition: A | Discharge status: Home / Self Care | Payer: Medicare Other | Attending: Advanced Practice Midwife | Admitting: Advanced Practice Midwife

## 2016-01-30 DIAGNOSIS — N611 Abscess of the breast and nipple: Secondary | ICD-10-CM

## 2016-02-01 ENCOUNTER — Encounter: Payer: Self-pay | Admitting: Family Medicine

## 2016-02-09 ENCOUNTER — Encounter: Payer: Self-pay | Admitting: Advanced Practice Midwife

## 2016-02-09 DIAGNOSIS — N63 Unspecified lump in unspecified breast: Secondary | ICD-10-CM | POA: Insufficient documentation

## 2016-02-13 ENCOUNTER — Encounter: Payer: Self-pay | Admitting: Internal Medicine

## 2016-02-13 ENCOUNTER — Ambulatory Visit (INDEPENDENT_AMBULATORY_CARE_PROVIDER_SITE_OTHER): Payer: Medicare Other | Admitting: Internal Medicine

## 2016-02-13 VITALS — BP 121/75 | HR 86 | Temp 97.9°F | Ht 64.0 in | Wt 250.8 lb

## 2016-02-13 DIAGNOSIS — R1084 Generalized abdominal pain: Secondary | ICD-10-CM | POA: Diagnosis not present

## 2016-02-13 DIAGNOSIS — K219 Gastro-esophageal reflux disease without esophagitis: Secondary | ICD-10-CM | POA: Diagnosis not present

## 2016-02-13 DIAGNOSIS — G35 Multiple sclerosis: Secondary | ICD-10-CM | POA: Diagnosis not present

## 2016-02-13 LAB — COMPLETE METABOLIC PANEL WITH GFR
ALBUMIN: 4 g/dL (ref 3.6–5.1)
ALK PHOS: 44 U/L (ref 33–115)
ALT: 9 U/L (ref 6–29)
AST: 13 U/L (ref 10–30)
BILIRUBIN TOTAL: 0.5 mg/dL (ref 0.2–1.2)
BUN: 6 mg/dL — AB (ref 7–25)
CO2: 30 mmol/L (ref 20–31)
Calcium: 9.5 mg/dL (ref 8.6–10.2)
Chloride: 103 mmol/L (ref 98–110)
Creat: 0.88 mg/dL (ref 0.50–1.10)
GFR, Est African American: >89 mL/min (ref >=60)
GFR, Est Non African American: 87 mL/min (ref >=60)
GLUCOSE: 94 mg/dL (ref 65–99)
Potassium: 4.1 mmol/L (ref 3.5–5.3)
SODIUM: 141 mmol/L (ref 135–146)
TOTAL PROTEIN: 7.1 g/dL (ref 6.1–8.1)

## 2016-02-13 LAB — POCT H PYLORI SCREEN: H Pylori Screen, POC: NEGATIVE

## 2016-02-13 MED ORDER — PANTOPRAZOLE SODIUM 20 MG PO TBEC: 20.0000 mg | DELAYED_RELEASE_TABLET | Freq: Every day | ORAL | 1 refills | Status: DC

## 2016-02-13 MED ORDER — GABAPENTIN 300 MG PO CAPS: 600.0000 mg | ORAL_CAPSULE | Freq: Three times a day (TID) | ORAL | 1 refills | Status: DC

## 2016-02-13 MED ORDER — SIMETHICONE 125 MG PO CHEW: 125.0000 mg | CHEWABLE_TABLET | Freq: Four times a day (QID) | ORAL | 0 refills | Status: DC | PRN

## 2016-02-13 NOTE — Assessment & Plan Note (Addendum)
-   Chronic. Suspect complication of MS. Will try to rule out other causes (GERD, lactose intolerance) by treating symptoms.  - POCT H pylori negative. - Prescribed protonix 20 mg  - Prescribed mylicon for gas pains - Advised trying to cut out dairy and assessing for improvement - Will obtain CMP

## 2016-02-13 NOTE — Progress Notes (Signed)
[facility] Progress Note  Subjective:  [name] is a 30-y/o female with history of MS who presents for abdominal pain x 1 year.  Abdominal pain: - Chronic in nature. Patient concerned she may have IBS because of cycles of constipation and loose stools. Has BMs daily.  - Pain from bloating bothers her most; can make her "ball up in knots" and has increased flatulence. OTC gas pill has provided relief. - Thinks dairy may worsen discomfort - Takes miralax as needed for constipation.  - Complains of reflux that does not respond to omeprazole; says protonix had helped better in the past - Was evaluated for fibroids last year and found to have left ovarian cyst (likely physiologic) and possible endometrial polyp but no fibroids.  - Ibuprofen has not helped and tramadol made her stomach upset - No family history of gastrointestinal problems that patient is aware of but says son has struggled with constipation and daughter had intussusception as a toddler - Last had MS flare earlier this month, requiring steroids - No recent travel ROS: No dark stools, no emesis, no dysuria  No Known Allergies   Social: Never smoker  Objective: Blood pressure 121/75, pulse 86, temperature 97.9 F (36.6 C), temperature source Oral, height 5\' 4"  (1.626 m), weight 250 lb 12.8 oz (113.8 kg), last menstrual period 02/07/2016. Constitutional: Well-appearing female in NAD Cardiovascular: RRR, S1, S2, no m/r/g.  Pulmonary/Chest: Effort normal and breath sounds normal. No respiratory distress.  Abdominal: Soft. +BS, generalized TTP, ND, no rebound or guarding.  Musculoskeletal:  Neurological: AOx3, no focal deficits. Skin: Skin is warm and dry. No rash noted. No erythema.  Psychiatric: Normal mood and affect.  Vitals reviewed  Assessment/Plan: Abdominal pain - Chronic. Suspect complication of MS. Will try to rule out other causes (GERD, lactose intolerance) by treating symptoms.  - POCT H  pylori negative. - Prescribed protonix 20 mg  - Prescribed mylicon for gas pains - Advised trying to cut out dairy and assessing for improvement - Will obtain CMP  Follow-up in about 1 month to re-assess symptoms.  Olene Floss, MD [place], PGY-2

## 2016-02-13 NOTE — Patient Instructions (Signed)
Ms. Antonini,  I will call you with the results of your labs.  Please try mylicon for gas.  I have prescribed protonix for reflux.  If neither of these help, please call and I can refer you to gastroenterology.  Best, Dr. Ola Spurr

## 2016-02-18 ENCOUNTER — Telehealth: Payer: Self-pay | Admitting: Internal Medicine

## 2016-02-18 DIAGNOSIS — G8929 Other chronic pain: Secondary | ICD-10-CM

## 2016-02-18 DIAGNOSIS — R109 Unspecified abdominal pain: Principal | ICD-10-CM

## 2016-02-18 NOTE — Telephone Encounter (Signed)
Called patient to inform her of recent normal CMP. She expressed concern that neither protonix nor simethicone were helping her abdominal pain symptoms and described recent severe pain after eating chicken nuggets. Patient requesting specialist referral, as she has been dealing with her symptoms for over a year. Placed GI referral and recommended limiting fried foods until further evaluated.

## 2016-02-24 ENCOUNTER — Encounter: Payer: Medicare Other | Admitting: Obstetrics and Gynecology

## 2016-02-25 ENCOUNTER — Encounter: Payer: Self-pay | Admitting: Obstetrics and Gynecology

## 2016-02-25 NOTE — Progress Notes (Signed)
Patient did not keep 02/24/2016 GYN visit  [name] MD Attending Center for [place] [employer])

## 2016-03-27 ENCOUNTER — Ambulatory Visit (INDEPENDENT_AMBULATORY_CARE_PROVIDER_SITE_OTHER): Payer: Medicare Other | Admitting: Psychology

## 2016-03-27 DIAGNOSIS — R4689 Other symptoms and signs involving appearance and behavior: Secondary | ICD-10-CM

## 2016-03-27 NOTE — Progress Notes (Signed)
Reason for follow-up:  The patient's twin daughters (32 y.o.) are getting into trouble in 1.  Issues discussed:  We discussed the twin's difficulty with fighting with each other at home and school, competing with each other, and not listening when mom asks them to follow instructions.   Identified goals:  The patient identified working on helping her twins play together with less fighting as her first goal. We will start by increasing attention to the positive behaviors through providing specific, immediate praise, and ignoring negative behaviors such as begging for snacks or other rewards. Patient will follow up and plans to bring her kids next week at 3-4pm learn other strategies.

## 2016-03-27 NOTE — Assessment & Plan Note (Signed)
Assessment/Plan/Recommendations: The patient reports that her twin daughters frequently argue with her, fight between each other, argue and don't listen to instructions at school, and are unresponsive to punishment. The patient also has a 32 year old son and a 18 year old daughter who do not display behavior difficulties. The patient frequently resorts to yelling, mild physical punishment, and taking away candy or snack privileges. We discussed giving attention to positive behaviors with specific, immediate praise and reward, as well as ignoring negative behaviors when appropriate. We also discussed expecting potential increases in negative behavior initially after patient begins ignoring negative behaviors. Patient identified competition between her twins as a future problem to work on, and is interested in learning ways to manage her own stress with her children. The patient will return to brainstorm other behavioral strategies and bring her children in next Friday 04/03/16.

## 2016-03-27 NOTE — Patient Instructions (Signed)
Hi [name] to see you today! Today we discussed  1) providing praise when your twins are playing together well -immediate, specific  2) Ignoring negative behaviors unless there are safety issues -please be aware that when you ignore initially, the intensity of the behavior might increase  We will meet again next week Friday at [facility]

## 2016-04-02 ENCOUNTER — Telehealth: Payer: Self-pay | Admitting: Internal Medicine

## 2016-04-02 NOTE — Telephone Encounter (Signed)
Received fax from [facility] to renew personal care services. Called patient to ask what type of services she had been receiving. She said services had already been renewed through Neurology, and fax had been sent to [facility] in error.  Olene Floss, MD [place], PGY-2

## 2016-04-03 ENCOUNTER — Encounter: Payer: Self-pay | Admitting: Psychology

## 2016-04-03 ENCOUNTER — Ambulatory Visit: Payer: Medicare Other | Admitting: Psychology

## 2016-04-03 DIAGNOSIS — R4689 Other symptoms and signs involving appearance and behavior: Secondary | ICD-10-CM

## 2016-04-03 NOTE — Progress Notes (Unsigned)
Reason for follow-up:  Patient's children have behavioral difficulties in school and at home. Patient came in to follow-up with praise and ignoring strategies  Issues discussed:  Patient has had no success with praise and ignoring strategies in last week and children have been punished for taking cash from bus driver's wallet. Children were present at appointment and were active and engaged with clinician. Mom seemed frustrated with them and is very interested in medication. She says the school is also recommending medication and will be providing a report to [place]. [facility] had patient sign Release and Disclosure form to communicate with school.  Identified goals:  Patient will bring report from school and opted to discuss parent training strategies in a week given difficulty communicating with children present.

## 2016-04-03 NOTE — Assessment & Plan Note (Signed)
Assessment/Plan/Recommendations: Patient's daughters were generally polite and engaged throughout appointment. Mom expressed that difficulty with daughter's was continuing and is very interested in medication. [facility] discussed what she why specifically she was interested in medication for children and provided some psychoeducation around ADHD. Patient says school is doing some sort of evaluation possibly related to ADHD and will provide a report next week. [facility] discussed parent training strategies are part of a broader toolkit and will take time and troubleshooting to work. Patient opted to reschedule for next week to discuss behavioral strategies given difficulty communicating with children present and important phone calls patient receiving from school.

## 2016-04-10 ENCOUNTER — Ambulatory Visit: Payer: Medicare Other

## 2016-07-29 ENCOUNTER — Encounter (HOSPITAL_COMMUNITY): Payer: Self-pay | Admitting: Emergency Medicine

## 2016-07-29 ENCOUNTER — Emergency Department (HOSPITAL_COMMUNITY): Payer: Medicare Other

## 2016-07-29 ENCOUNTER — Emergency Department (HOSPITAL_COMMUNITY)
Admission: EM | Admit: 2016-07-29 | Discharge: 2016-07-29 | Disposition: A | Discharge status: Home / Self Care | Payer: Medicare Other | Attending: Emergency Medicine | Admitting: Emergency Medicine

## 2016-07-29 DIAGNOSIS — Z5321 Procedure and treatment not carried out due to patient leaving prior to being seen by health care provider: Secondary | ICD-10-CM | POA: Diagnosis not present

## 2016-07-29 DIAGNOSIS — R0602 Shortness of breath: Secondary | ICD-10-CM | POA: Insufficient documentation

## 2016-07-29 NOTE — ED Notes (Signed)
Called for room x 1 with no answer.

## 2016-07-29 NOTE — ED Notes (Signed)
Called for room x 2 with no answer  

## 2016-07-29 NOTE — ED Notes (Signed)
Called for room x 3 with no answer 

## 2016-07-29 NOTE — ED Triage Notes (Signed)
Pt reports shortness of breath , productive cough and body aches x 1 day. Reports kids has been dx flu. Also reports Hx MS.

## 2016-07-29 NOTE — ED Notes (Signed)
Pt name called by this RN x2 w/ no response.

## 2016-12-14 ENCOUNTER — Encounter (HOSPITAL_COMMUNITY): Payer: Self-pay | Admitting: *Deleted

## 2016-12-14 ENCOUNTER — Inpatient Hospital Stay (HOSPITAL_COMMUNITY)
Admission: AD | Admit: 2016-12-14 | Discharge: 2016-12-14 | Disposition: A | Discharge status: Home Health | Payer: Medicare Other | Source: Ambulatory Visit | Attending: Obstetrics & Gynecology | Admitting: Obstetrics & Gynecology

## 2016-12-14 DIAGNOSIS — Z8249 Family history of ischemic heart disease and other diseases of the circulatory system: Secondary | ICD-10-CM | POA: Insufficient documentation

## 2016-12-14 DIAGNOSIS — Z79899 Other long term (current) drug therapy: Secondary | ICD-10-CM | POA: Insufficient documentation

## 2016-12-14 DIAGNOSIS — G35 Multiple sclerosis: Secondary | ICD-10-CM | POA: Diagnosis not present

## 2016-12-14 DIAGNOSIS — Z833 Family history of diabetes mellitus: Secondary | ICD-10-CM | POA: Diagnosis not present

## 2016-12-14 DIAGNOSIS — Z6839 Body mass index (BMI) 39.0-39.9, adult: Secondary | ICD-10-CM | POA: Diagnosis not present

## 2016-12-14 DIAGNOSIS — N946 Dysmenorrhea, unspecified: Secondary | ICD-10-CM

## 2016-12-14 DIAGNOSIS — R1030 Lower abdominal pain, unspecified: Secondary | ICD-10-CM | POA: Insufficient documentation

## 2016-12-14 DIAGNOSIS — E669 Obesity, unspecified: Secondary | ICD-10-CM | POA: Insufficient documentation

## 2016-12-14 DIAGNOSIS — R110 Nausea: Secondary | ICD-10-CM | POA: Diagnosis not present

## 2016-12-14 DIAGNOSIS — N889 Noninflammatory disorder of cervix uteri, unspecified: Secondary | ICD-10-CM | POA: Diagnosis not present

## 2016-12-14 DIAGNOSIS — R109 Unspecified abdominal pain: Secondary | ICD-10-CM | POA: Diagnosis present

## 2016-12-14 DIAGNOSIS — R102 Pelvic and perineal pain: Secondary | ICD-10-CM

## 2016-12-14 DIAGNOSIS — N888 Other specified noninflammatory disorders of cervix uteri: Secondary | ICD-10-CM

## 2016-12-14 LAB — CBC WITH DIFFERENTIAL/PLATELET
BASOS ABS: 0 10*3/uL (ref 0.0–0.1)
BASOS PCT: 1 %
EOS ABS: 0.1 10*3/uL (ref 0.0–0.7)
EOS PCT: 1 %
HCT: 36.2 % (ref 36.0–46.0)
Hemoglobin: 11.7 g/dL — ABNORMAL LOW (ref 12.0–15.0)
LYMPHS ABS: 2.3 10*3/uL (ref 0.7–4.0)
Lymphocytes Relative: 38 %
MCH: 30.3 pg (ref 26.0–34.0)
MCHC: 32.3 g/dL (ref 30.0–36.0)
MCV: 93.8 fL (ref 78.0–100.0)
Monocytes Absolute: 0.3 10*3/uL (ref 0.1–1.0)
Monocytes Relative: 5 %
NEUTROS PCT: 55 %
Neutro Abs: 3.3 10*3/uL (ref 1.7–7.7)
PLATELETS: 365 10*3/uL (ref 150–400)
RBC: 3.86 MIL/uL — ABNORMAL LOW (ref 3.87–5.11)
RDW: 12.8 % (ref 11.5–15.5)
WBC: 5.9 10*3/uL (ref 4.0–10.5)

## 2016-12-14 LAB — URINALYSIS, ROUTINE W REFLEX MICROSCOPIC
BACTERIA UA: NONE SEEN
Bilirubin Urine: NEGATIVE
Glucose, UA: NEGATIVE mg/dL
Ketones, ur: NEGATIVE mg/dL
Leukocytes, UA: NEGATIVE
Nitrite: NEGATIVE
PROTEIN: NEGATIVE mg/dL
SPECIFIC GRAVITY, URINE: 1.021 (ref 1.005–1.030)
pH: 6 (ref 5.0–8.0)

## 2016-12-14 LAB — POCT PREGNANCY, URINE: PREG TEST UR: NEGATIVE

## 2016-12-14 LAB — WET PREP, GENITAL
Clue Cells Wet Prep HPF POC: NONE SEEN
Sperm: NONE SEEN
TRICH WET PREP: NONE SEEN
YEAST WET PREP: NONE SEEN

## 2016-12-14 MED ORDER — KETOROLAC TROMETHAMINE 60 MG/2ML IM SOLN
60.0000 mg | Freq: Once | INTRAMUSCULAR | Status: AC
  Administered 2016-12-14: 60 mg via INTRAMUSCULAR
  Filled 2016-12-14: qty 2

## 2016-12-14 NOTE — MAU Provider Note (Signed)
History    Chief Complaint  Patient presents with  . Abdominal Pain   Patient is a 31 year old female [name] presenting for lower abdominal pain and nausea.  She states her pain and nausea started 3 days ago and has not improved.  She says it feels like a cramp that will not go away.  She rates the pain an 8/10 and says sometimes it radiates around her right side to her back.  The pain is aggravated with standing up straight and worse with intercourse.  Her last bowel movement was yesterday, it was slightly loose and non-bloody.  She has no pain or blood with urination.  She denies, fevers, chills, or diarrhea.  She has tried ibuprofen, gas relief pills, and acid reflux medication without any relief.     Pertinent Gynecological History: Menses: flow normally heavy during menses Bleeding: denies bleeding outside of cycle Contraception: tubal ligation Sexually transmitted diseases: husband only partner last 5 years    Past Medical History:  Diagnosis Date  . Abnormal Pap smear    Colpo>normal  . Gonorrhea   . Multiple sclerosis ([place])   . Neuromuscular disorder [facility])    diagnosed with MS this visit  . Obesity   . Ovarian cyst   . Pernicious anemia 05/02/2013  . Preterm labor   . Urinary tract infection   . Vaginal Pap smear, abnormal     Past Surgical History:  Procedure Laterality Date  . COLPOSCOPY    . TUBAL LIGATION     2012    Family History  Problem Relation Age of Onset  . Diabetes Mother   . Hypertension Mother   . Diabetes Father   . Hypertension Father   . Anesthesia problems Neg Hx   . Other Neg Hx     Social History  Substance Use Topics  . Smoking status: Never Smoker  . Smokeless tobacco: Never Used  . Alcohol use No     Comment:      Allergies: No Known Allergies  Prescriptions Prior to Admission  Medication Sig Dispense Refill Last Dose  . Daclizumab (ZINBRYTA) 150 MG/ML SOSY Inject into the skin every 30 (thirty) days.   11/18/2015 at am   . gabapentin (NEURONTIN) 300 MG capsule Take 2 capsules (600 mg total) by mouth 3 (three) times daily. 90 capsule 1   . ibuprofen (ADVIL,MOTRIN) 600 MG tablet Take 1 tablet (600 mg total) by mouth every 6 (six) hours as needed. (Patient taking differently: Take 600 mg by mouth every 6 (six) hours as needed for mild pain or moderate pain. ) 60 tablet 2 Past Week at Unknown time  . pantoprazole (PROTONIX) 20 MG tablet Take 1 tablet (20 mg total) by mouth daily. 30 tablet 1   . simethicone (MYLICON) 505 MG chewable tablet Chew 1 tablet (125 mg total) by mouth every 6 (six) hours as needed for flatulence. 120 tablet 0   . SUMAtriptan (IMITREX) 100 MG tablet Take 1 tablet by mouth as needed for a migraine, do not take more than 3 times a week.   Past Month at Unknown time    Review of Systems  Constitutional: Negative for chills and fever.  Gastrointestinal: Positive for abdominal pain and nausea. Negative for blood in stool, constipation, diarrhea and vomiting.  Genitourinary: Positive for flank pain. Negative for dysuria and hematuria.   Physical Exam Blood pressure 134/84, pulse 64, temperature 99.5 F (37.5 C), resp. rate 18, weight 104.3 kg (230 lb), last menstrual period  11/14/2016. Physical Exam  Constitutional: She appears well-developed. She is cooperative. No distress.  Cardiovascular: Normal rate and regular rhythm.   Respiratory: Effort normal. No respiratory distress.  GI: Soft. Bowel sounds are normal. She exhibits no distension. There is tenderness in the right lower quadrant and suprapubic area. There is no rebound, no guarding and no CVA tenderness.  Neurological: She is alert.  Skin: Skin is warm and dry. No rash noted.  Pelvic:    MAU Course Procedures  Arlana Lindau, PA-Student 12/14/2016  The patient was seen and examined by me also Agree with note  Her cervix feels abnormal as it did before in my exam on 03/11/15.  Pain is specifically located in lower uterine segment,  + cervical motion tenderness, but negative pain in adnexae.  There was a nonspecific soft tissue mass noted on [place] at that time.  Dr Roselie Awkward came and reexamined her and agrees it feels like a possible paracervical mass, likely fibroid-type.  Her last pap here in 2014 was ASCUS, but did not get colpo done. [facility] told her their 2016 pap was normal.   We will get an outpatient [place] and reevaluate her in clinic. Will also get pap that day, to rule out dysplasia.    She did feel better with Toradol.  Is due for menses this week, possible dysmenorrhea component.    Ready for discharge Will have her followup in clinic as scheduled  Encouraged to return here or to other Urgent Care/ED if she develops worsening of symptoms, increase in pain, fever, or other concerning symptoms.     Seabron Spates, CNM

## 2016-12-14 NOTE — Discharge Instructions (Signed)
Abdominal or Pelvic Ultrasound An ultrasound is a test that uses sound waves to take pictures of the inside of the body. An abdominal ultrasound takes pictures of the inside of the abdomen, and a pelvic ultrasound takes pictures of the inside of the pelvis. An abdominal or pelvic ultrasound may be done:  To provide information about a baby developing in the womb, such as the baby's position and heartbeat.  To check the shape or size of an organ.  To check for problems such as: ? Cysts. ? Masses. ? Inflammation. ? Kidney stones. ? Gallstones.  Tell a health care provider about:  Any allergies you have.  All medicines you are taking, including vitamins, herbs, eye drops, creams, and over-the-counter medicines.  Previous surgeries you have had.  Any medical conditions you have.  Whether you are pregnant or may be pregnant. What are the risks? There are no known risks or complications from having this test. What happens before the procedure?  Follow instructions from your health care provider about eating or drinking restrictions.  Wear clothing that is easily washable in case the gel used for the test gets on your clothes. What happens during the procedure?  A gel will be applied to your skin. It may feel cool.  A wand-like device called a transducer will be placed on the area to be examined.  The transducer will take pictures. These will be displayed on one or more monitors that look like small TV screens. What happens after the procedure?  It is your responsibility to get your test results. Ask your health care provider or the department performing the test when your results will be ready.  Keep follow-up visits as told by your health care provider. This is important. This information is not intended to replace advice given to you by your health care provider. Make sure you discuss any questions you have with your health care provider. Document Released: 05/29/2000  Document Revised: 02/12/2016 Document Reviewed: 02/22/2015 Elsevier Interactive Patient Education  2018 [employer]. Pap Test Why am I having this test? A pap test is sometimes called a pap smear. It is a screening test that is used to check for signs of cancer of the vagina, cervix, and uterus. The test can also identify the presence of infection or precancerous changes. Your health care provider will likely recommend you have this test done on a regular basis. This test may be done:  Every 3 years, starting at age 66.  Every 5 years, in combination with testing for the presence of human papillomavirus (HPV).  More or less often depending on other medical conditions.  What kind of sample is taken? Using a small cotton swab, plastic spatula, or brush, your health care provider will collect a sample of cells from the surface of your cervix. Your cervix is the opening to your uterus, also called a womb. Secretions from the cervix and vagina may also be collected. How do I prepare for this test?  Be aware of where you are in your menstrual cycle. You may be asked to reschedule the test if you are menstruating on the day of the test.  You may need to reschedule if you have a known vaginal infection on the day of the test.  You may be asked to avoid douching or taking a bath the day before or the day of the test.  Some medicines can cause abnormal test results, such as digitalis and tetracycline. Talk with your health care provider before  your test if you take one of these medicines. What do the results mean? Abnormal test results may indicate a number of health conditions. These may include:  Cancer. Although pap test results cannot be used to diagnose cancer of the cervix, vagina, or uterus, they may suggest the possibility of cancer. Further tests would be required to determine if cancer is present.  Sexually transmitted disease.  Fungal infection.  Parasite infection.  Herpes  infection.  A condition causing or contributing to infertility.  It is your responsibility to obtain your test results. Ask the lab or department performing the test when and how you will get your results. Contact your health care provider to discuss any questions you have about your results. Talk with your health care provider to discuss your results, treatment options, and if necessary, the need for more tests. Talk with your health care provider if you have any questions about your results. This information is not intended to replace advice given to you by your health care provider. Make sure you discuss any questions you have with your health care provider. Document Released: 08/22/2002 Document Revised: 02/05/2016 Document Reviewed: 10/23/2013 Elsevier Interactive Patient Education  [employer].

## 2016-12-14 NOTE — MAU Note (Signed)
Pt presents to MAU with complaints of lower abdominal cramping for 3 days. Denies any vaginal bleeding or abnormal discharge

## 2016-12-15 LAB — HIV ANTIBODY (ROUTINE TESTING W REFLEX): HIV Screen 4th Generation wRfx: NONREACTIVE

## 2016-12-15 LAB — GC/CHLAMYDIA PROBE AMP (~~LOC~~) NOT AT ARMC
Chlamydia: NEGATIVE
Neisseria Gonorrhea: NEGATIVE

## 2016-12-22 ENCOUNTER — Ambulatory Visit (HOSPITAL_COMMUNITY)
Admission: RE | Admit: 2016-12-22 | Discharge: 2016-12-22 | Disposition: A | Discharge status: Home / Self Care | Payer: Medicare Other | Source: Ambulatory Visit | Attending: Advanced Practice Midwife | Admitting: Advanced Practice Midwife

## 2016-12-22 DIAGNOSIS — R102 Pelvic and perineal pain: Secondary | ICD-10-CM | POA: Insufficient documentation

## 2016-12-22 DIAGNOSIS — N888 Other specified noninflammatory disorders of cervix uteri: Secondary | ICD-10-CM | POA: Insufficient documentation

## 2016-12-23 ENCOUNTER — Encounter (HOSPITAL_COMMUNITY): Payer: Self-pay | Admitting: Advanced Practice Midwife

## 2016-12-23 DIAGNOSIS — N888 Other specified noninflammatory disorders of cervix uteri: Secondary | ICD-10-CM | POA: Insufficient documentation

## 2017-01-15 ENCOUNTER — Emergency Department (HOSPITAL_COMMUNITY)
Admission: EM | Admit: 2017-01-15 | Discharge: 2017-01-15 | Disposition: A | Discharge status: Home / Self Care | Payer: Medicare Other | Attending: Emergency Medicine | Admitting: Emergency Medicine

## 2017-01-15 ENCOUNTER — Encounter (HOSPITAL_COMMUNITY): Payer: Self-pay | Admitting: Nurse Practitioner

## 2017-01-15 DIAGNOSIS — Z79899 Other long term (current) drug therapy: Secondary | ICD-10-CM | POA: Diagnosis not present

## 2017-01-15 DIAGNOSIS — Z791 Long term (current) use of non-steroidal anti-inflammatories (NSAID): Secondary | ICD-10-CM | POA: Diagnosis not present

## 2017-01-15 DIAGNOSIS — R197 Diarrhea, unspecified: Secondary | ICD-10-CM

## 2017-01-15 DIAGNOSIS — R112 Nausea with vomiting, unspecified: Secondary | ICD-10-CM | POA: Diagnosis present

## 2017-01-15 LAB — COMPREHENSIVE METABOLIC PANEL
ALBUMIN: 4 g/dL (ref 3.5–5.0)
ALK PHOS: 37 U/L — AB (ref 38–126)
ALT: 10 U/L — ABNORMAL LOW (ref 14–54)
AST: 16 U/L (ref 15–41)
Anion gap: 7 (ref 5–15)
BUN: 9 mg/dL (ref 6–20)
CALCIUM: 9.2 mg/dL (ref 8.9–10.3)
CHLORIDE: 105 mmol/L (ref 101–111)
CO2: 25 mmol/L (ref 22–32)
Creatinine, Ser: 1.05 mg/dL — ABNORMAL HIGH (ref 0.44–1.00)
GFR calc non Af Amer: >60 mL/min (ref >60)
Glucose, Bld: 87 mg/dL (ref 65–99)
Potassium: 3.7 mmol/L (ref 3.5–5.1)
SODIUM: 137 mmol/L (ref 135–145)
Total Bilirubin: 0.6 mg/dL (ref 0.3–1.2)
Total Protein: 7.6 g/dL (ref 6.5–8.1)

## 2017-01-15 LAB — CBC
HCT: 36.6 % (ref 36.0–46.0)
HEMOGLOBIN: 11.6 g/dL — AB (ref 12.0–15.0)
MCH: 29.6 pg (ref 26.0–34.0)
MCHC: 31.7 g/dL (ref 30.0–36.0)
MCV: 93.4 fL (ref 78.0–100.0)
Platelets: 459 10*3/uL — ABNORMAL HIGH (ref 150–400)
RBC: 3.92 MIL/uL (ref 3.87–5.11)
RDW: 12.6 % (ref 11.5–15.5)
WBC: 7.4 10*3/uL (ref 4.0–10.5)

## 2017-01-15 LAB — I-STAT BETA HCG BLOOD, ED (MC, WL, AP ONLY)

## 2017-01-15 MED ORDER — ONDANSETRON HCL 4 MG/2ML IJ SOLN
4.0000 mg | Freq: Once | INTRAMUSCULAR | Status: AC
Start: 2017-01-15 — End: 2017-01-15
  Administered 2017-01-15: 4 mg via INTRAVENOUS
  Filled 2017-01-15: qty 2

## 2017-01-15 MED ORDER — ONDANSETRON 4 MG PO TBDP: ORAL_TABLET | ORAL | 0 refills | Status: DC

## 2017-01-15 MED ORDER — SODIUM CHLORIDE 0.9 % IV BOLUS (SEPSIS)
1000.0000 mL | Freq: Once | INTRAVENOUS | Status: AC
  Administered 2017-01-15: 1000 mL via INTRAVENOUS

## 2017-01-15 MED ORDER — PANTOPRAZOLE SODIUM 40 MG IV SOLR
40.0000 mg | Freq: Once | INTRAVENOUS | Status: AC
  Administered 2017-01-15: 40 mg via INTRAVENOUS
  Filled 2017-01-15: qty 40

## 2017-01-15 MED ORDER — KETOROLAC TROMETHAMINE 30 MG/ML IJ SOLN
30.0000 mg | Freq: Once | INTRAMUSCULAR | Status: AC
  Administered 2017-01-15: 30 mg via INTRAVENOUS
  Filled 2017-01-15: qty 1

## 2017-01-15 NOTE — ED Triage Notes (Signed)
Pt presents with vomiting. The vomiting began after she bit into a raw chicken nugget at lunch today. She reports abdominal pain and  several episodes of vomiting and diarrhea since.

## 2017-01-15 NOTE — ED Provider Notes (Signed)
[place] DEPT Provider Note   CSN: [id number] Arrival date & time: 01/15/17  1539     History   Chief Complaint Chief Complaint  Patient presents with  . Emesis    HPI [name] is a 31 y.o. female.  Patient complains of nausea vomiting and diarrhea. She accidentally ate some" chicken   The history is provided by the patient.  Emesis   This is a new problem. The current episode started 12 to 24 hours ago. The problem occurs 2 to 4 times per day. The problem has not changed since onset.The emesis has an appearance of stomach contents. There has been no fever. Associated symptoms include diarrhea. Pertinent negatives include no abdominal pain, no chills, no cough and no headaches. Risk factors: Eating raw chicken.    Past Medical History:  Diagnosis Date  . Abnormal Pap smear    Colpo>normal  . Gonorrhea   . Multiple sclerosis ([place])   . Neuromuscular disorder [facility])    diagnosed with MS this visit  . Obesity   . Ovarian cyst   . Pernicious anemia 05/02/2013  . Preterm labor   . Urinary tract infection   . Vaginal Pap smear, abnormal     Patient Active Problem List   Diagnosis Date Noted  . Cervical mass 12/23/2016  . Child behavior problem 03/27/2016  . Breast mass 02/09/2016  . B12 deficiency 10/04/2013  . Unspecified vitamin D deficiency 10/04/2013  . Malabsorption 08/29/2013  . 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 Insomnia 06/27/2013  . Adjustment disorder with mixed anxiety and depressed mood 03/13/2013  . Multiple sclerosis ([place]) 03/13/2013  . Abdominal pain 07/29/2007  . OBESITY, NOS 08/12/2006    Past Surgical History:  Procedure Laterality Date  . COLPOSCOPY    . TUBAL LIGATION     2012    OB History    Gravida Para Term Preterm AB Living   6 3 1 2 3 4    SAB TAB Ectopic Multiple Live Births     3   1 2        Home Medications    Prior to Admission medications   Medication Sig Start Date End Date Taking?  Authorizing Provider  ibuprofen (ADVIL,MOTRIN) 800 MG tablet Take 800 mg by mouth every 8 (eight) hours as needed for mild pain or moderate pain.   Yes [provider]  ocrelizumab (OCREVUS) 300 MG/10ML injection Inject 600 mg into the vein every 6 (six) months.   Yes [provider]  ondansetron (ZOFRAN ODT) 4 MG disintegrating tablet 4mg  ODT q4 hours prn nausea/vomit 01/15/17   Milton Ferguson, MD    Family History Family History  Problem Relation Age of Onset  . Diabetes Mother   . Hypertension Mother   . Diabetes Father   . Hypertension Father   . Anesthesia problems Neg Hx   . Other Neg Hx     Social History Social History  Substance Use Topics  . Smoking status: Never Smoker  . Smokeless tobacco: Never Used  . Alcohol use No     Comment:       Allergies   Patient has no known allergies.   Review of Systems Review of Systems  Constitutional: Negative for appetite change, chills and fatigue.  HENT: Negative for congestion, ear discharge and sinus pressure.   Eyes: Negative for discharge.  Respiratory: Negative for cough.   Cardiovascular: Negative for chest pain.  Gastrointestinal: Positive for diarrhea and vomiting. Negative for abdominal  pain.  Genitourinary: Negative for frequency and hematuria.  Musculoskeletal: Negative for back pain.  Skin: Negative for rash.  Neurological: Negative for seizures and headaches.  Psychiatric/Behavioral: Negative for hallucinations.     Physical Exam Updated Vital Signs BP 134/90   Pulse 89   Temp 99 F (37.2 C) (Oral)   Resp 18   SpO2 100%   Physical Exam  Constitutional: She is oriented to person, place, and time. She appears well-developed.  HENT:  Head: Normocephalic.  Eyes: Conjunctivae and EOM are normal. No scleral icterus.  Neck: Neck supple. No thyromegaly present.  Cardiovascular: Normal rate and regular rhythm.  Exam reveals no gallop and no friction rub.   No murmur  heard. Pulmonary/Chest: No stridor. She has no wheezes. She has no rales. She exhibits no tenderness.  Abdominal: She exhibits no distension. There is tenderness. There is no rebound.  Musculoskeletal: Normal range of motion. She exhibits no edema.  Lymphadenopathy:    She has no cervical adenopathy.  Neurological: She is oriented to person, place, and time. She exhibits normal muscle tone. Coordination normal.  Skin: No rash noted. No erythema.  Psychiatric: She has a normal mood and affect. Her behavior is normal.     ED Treatments / Results  Labs (all labs ordered are listed, but only abnormal results are displayed) Labs Reviewed  COMPREHENSIVE METABOLIC PANEL - Abnormal; Notable for the following:       Result Value   Creatinine, Ser 1.05 (*)    ALT 10 (*)    Alkaline Phosphatase 37 (*)    All other components within normal limits  CBC - Abnormal; Notable for the following:    Hemoglobin 11.6 (*)    Platelets 459 (*)    All other components within normal limits  I-STAT BETA HCG BLOOD, ED ([facility], [facility], [facility] ONLY)    EKG  EKG Interpretation None       Radiology No results found.  Procedures Procedures (including critical care time)  Medications Ordered in ED Medications  ketorolac (TORADOL) 30 MG/ML injection 30 mg (30 mg Intravenous Given 01/15/17 1713)  pantoprazole (PROTONIX) injection 40 mg (40 mg Intravenous Given 01/15/17 1718)  ondansetron (ZOFRAN) injection 4 mg (4 mg Intravenous Given 01/15/17 1712)  sodium chloride 0.9 % bolus 1,000 mL (1,000 mLs Intravenous New Bag/Given 01/15/17 1711)     Initial Impression / Assessment and Plan / ED Course  I have reviewed the triage vital signs and the nursing notes.  Pertinent labs & imaging results that were available during my care of the patient were reviewed by me and considered in my medical decision making (see chart for details).     Patient with nausea vomiting diarrhea. Patient improved with Zofran and fluids and  will follow-up with her PCP as needed  Final Clinical Impressions(s) / ED Diagnoses   Final diagnoses:  Nausea vomiting and diarrhea    New Prescriptions New Prescriptions   ONDANSETRON (ZOFRAN ODT) 4 MG DISINTEGRATING TABLET    4mg  ODT q4 hours prn nausea/vomit     Milton Ferguson, MD 01/15/17 1829

## 2017-01-15 NOTE — ED Notes (Signed)
Patient able to ambulate independently  

## 2017-01-15 NOTE — Discharge Instructions (Signed)
Drink plenty of fluids and follow-up with your doctor if any problems.

## 2017-01-20 ENCOUNTER — Ambulatory Visit (INDEPENDENT_AMBULATORY_CARE_PROVIDER_SITE_OTHER): Payer: Medicare Other | Admitting: Obstetrics & Gynecology

## 2017-01-20 VITALS — BP 120/69 | HR 63 | Ht 64.0 in

## 2017-01-20 DIAGNOSIS — N888 Other specified noninflammatory disorders of cervix uteri: Secondary | ICD-10-CM

## 2017-01-21 ENCOUNTER — Encounter: Payer: Self-pay | Admitting: Obstetrics & Gynecology

## 2017-01-21 ENCOUNTER — Telehealth: Payer: Self-pay

## 2017-01-21 ENCOUNTER — Other Ambulatory Visit (HOSPITAL_COMMUNITY)
Admission: RE | Admit: 2017-01-21 | Discharge: 2017-01-21 | Disposition: A | Discharge status: Home / Self Care | Payer: Medicare Other | Source: Ambulatory Visit | Attending: Obstetrics & Gynecology | Admitting: Obstetrics & Gynecology

## 2017-01-21 DIAGNOSIS — N888 Other specified noninflammatory disorders of cervix uteri: Secondary | ICD-10-CM | POA: Diagnosis present

## 2017-01-21 NOTE — Telephone Encounter (Signed)
Called patient to inform her of her MRI appointment on August 20th at 5:00pm.Patient was instructed to arrive at 4:45pm. Patient repeated appointment time to me and verbalized understanding.

## 2017-01-22 ENCOUNTER — Encounter: Payer: Self-pay | Admitting: Obstetrics & Gynecology

## 2017-01-22 NOTE — Progress Notes (Signed)
GYNECOLOGY OFFICE VISIT NOTE  History:  31 y.o. [id number] here today for follow up of cervical mass found on exam and recent imaging.  She is very concerned about this, wants it removed. Associated with menorrhagia, dyspareunia and pelvic pain.  Was seen in [facility] on 12/14/2016 for pain.  Denies any current abnormal vaginal discharge, fevers, chills, sweats, dysuria, nausea, vomiting, other GI or GU symptoms or other general symptoms.  Past Medical History:  Diagnosis Date  . Abnormal Pap smear    Colpo>normal  . Gonorrhea   . Multiple sclerosis ([place])   . Neuromuscular disorder [facility])    diagnosed with MS this visit  . Obesity   . Ovarian cyst   . Pernicious anemia 05/02/2013  . Preterm labor   . Urinary tract infection   . Vaginal Pap smear, abnormal     Past Surgical History:  Procedure Laterality Date  . COLPOSCOPY    . TUBAL LIGATION     2012    The following portions of the patient's history were reviewed and updated as appropriate: allergies, current medications, past family history, past medical history, past social history, past surgical history and problem list.   Health Maintenance:  ASCUS pap and negative HRHPV on 05/24/2013.    Review of Systems:  Pertinent items noted in HPI and remainder of comprehensive ROS otherwise negative.   Objective:  Physical Exam BP 120/69   Pulse 63   Ht 5\' 4"  (1.626 m)   LMP 01/17/2017  CONSTITUTIONAL: Well-developed, well-nourished female in no acute distress.  HENT:  Normocephalic, atraumatic. External right and left ear normal. Oropharynx is clear and moist EYES: Conjunctivae and EOM are normal. Pupils are equal, round, and reactive to light. No scleral icterus.  NECK: Normal range of motion, supple, no masses SKIN: Skin is warm and dry. No rash noted. Not diaphoretic. No erythema. No pallor. NEUROLOGIC: Alert and oriented to person, place, and time. Normal reflexes, muscle tone coordination. No cranial nerve deficit  noted. PSYCHIATRIC: Normal mood and affect. Normal behavior. Normal judgment and thought content. CARDIOVASCULAR: Normal heart rate noted RESPIRATORY: Effort and breath sounds normal, no problems with respiration noted ABDOMEN: Soft, no distention noted.   MUSCULOSKELETAL: Normal range of motion. No edema noted. PELVIC: Normal appearing external genitalia; normal appearing vaginal mucosa.  About 3 cm solid round mass palpated on left side of the cervix, this deviated the entire cervix to the right.  Small amount of blood in vagina, no active bleeding. Pap smear obtained.  No abnormal discharge noted.  Normal uterine size, no other palpable masses, no uterine or adnexal tenderness.   Labs and Imaging 12/22/2016 TRANSABDOMINAL AND TRANSVAGINAL ULTRASOUND OF PELVIS CLINICAL DATA:  Dyspareunia, painful pelvic exam, cervical mass COMPARISON:  03/11/2015 FINDINGS: Uterus Measurements: 10.2 x 5.1 x 5.0 cm. Focal mass identified at LEFT lateral aspect of cervix, 2.5 x 3.2 x 2.3 cm. No additional uterine masses. Nabothian cysts at cervix as well. Endometrium Thickness: 11 mm thick, normal. No endometrial fluid or focal abnormality Right ovary Measurements: 2.9 x 1.8 x 1.6 cm. Normal morphology without mass Left ovary Measurements: 3.0 x 1.5 x 1.3 cm. Normal morphology without mass Other findings No free pelvic fluid or additional pelvic masses.  IMPRESSION:  Focal mass identified at LEFT lateral aspect of cervix, 2.5 x 3.2 x2.3 cm in size, could represent a cervical neoplasm or a low uterine leiomyoma.  Recommend correlation with direct visualization of the cervix and consider MR imaging for further assessment.  Electronically  Signed   By: Lavonia Dana M.D.   On: 12/22/2016 19:33  Assessment & Plan:  1. Cervical mass Most likely a cervical fibroid, will get MRI for for further characterization. Pap also done, will follow up results and manage accordingly. - Cytology - PAP - MR  PELVIS W WO CONTRAST; Future For surgery, recommended vaginal hysterectomy (history of SVD x 4). Patient is hesitant about a hysterectomy, interested in just removing the fibroid.  Discussed cervical myomectomy options, and increased risk of vaginal hysterectomy due bleeding or other complications.  Will schedule for Transvaginal removal of cervical fibroid, possible vaginal hysterectomy.  Patient agrees with this proposed surgery.  The risks of surgery were discussed in detail with the patient including but not limited to: bleeding which may require transfusion or reoperation; infection which may require antibiotics; injury to bowel, bladder, ureters or other surrounding organs; need for additional procedures including laparoscopy or laparotomy; thromboembolic phenomenon, incisional problems and other postoperative/anesthesia complications.  Patient was also advised that she will remain in house for 1-2 nights; and expected recovery time after a myomectomy is up to 4 weeks, after hysterectomy is 6-8 weeks.  Likelihood of success in alleviating the patient's symptoms was discussed.   She was told that she will be contacted by our surgical scheduler regarding the time and date of her surgery; routine preoperative instructions of having nothing to eat or drink after midnight on the day prior to surgery and also coming to the hospital 1.5 hours prior to her time of surgery were also emphasized.  She was told she may be called for a preoperative appointment about a week prior to surgery and will be given further preoperative instructions at that visit.  Routine postoperative instructions will be reviewed with the patient and her family in detail after surgery.  Printed patient education handouts about the procedure was given to the patient to review at home.   Return if symptoms worsen or fail to improve.   Total face-to-face time with patient: 25 minutes. Over 50% of encounter was spent on counseling and  coordination of care.   Verita Schneiders, MD, [place] [facility] [place], [facility] for [employer], [place]

## 2017-01-22 NOTE — Patient Instructions (Signed)
Myomectomy Myomectomy is surgery to remove a noncancerous tumor (myoma) from the uterus. Myomas are tumors made up of fibrous tissue. They are often called fibroid tumors. Fibroid tumors can range from the size of a pea to the size of a grapefruit. In a myomectomy, the fibroid tumor is removed without removing the uterus. Because these tumors are rarely cancerous, this surgery is usually done only if the tumor is growing or causing symptoms such as pain, pressure, bleeding, or pain with intercourse. LET YOUR HEALTH CARE PROVIDER KNOW ABOUT:  Any allergies you have.  All medicines you are taking, including vitamins, herbs, eye drops, creams, and over-the-counter medicines.  Previous problems you or members of your family have had with the use of anesthetics.  Any blood disorders you have.  Previous surgeries you have had.  Medical conditions you have. RISKS AND COMPLICATIONS Generally, this is a safe procedure. However, as with any procedure, complications can occur. Possible complications include:  Excessive bleeding.  Infection.  Injury to nearby organs.  Blood clots in the legs, chest, or brain.  Scar tissue on other organs and in the pelvis. This may require another surgery to remove the scar tissue.  BEFORE THE PROCEDURE  Ask your health care provider about changing or stopping your regular medicines. Avoid taking aspirin or blood thinners as directed by your health care provider.  Do not  eat or drink anything after midnight on the night before surgery.  If you smoke, do not  smoke for 2 weeks before the surgery.  Do not  drink alcohol the day before the surgery.  Arrange for someone to drive you home after the procedure or after your hospital stay. Also arrange for someone to help you with activities during your recovery. PROCEDURE You will be given medicine to make you sleep through the procedure (general anesthetic). Any of the following methods may be used to perform  a myomectomy:  Small monitors will be put on your body. They are used to check your heart, blood pressure, and oxygen level.  An IV access tube will be put into one of your veins. Medicine will be able to flow directly into your body through this IV tube.  You might be given a medicine to help you relax (sedative).  You will be given a medicine to make you sleep (general anesthetic). A breathing tube will be placed into your lungs during the procedure.  A thin, flexible tube (catheter) will be inserted into your bladder to collect urine.  Any of the following methods may be used to perform a myomectomy: ? Hysteroscopic myomectomy-This method may be used when the fibroid tumor is inside the cavity of the uterus. A long, thin tube that is like a telescope (hysteroscope) is inserted inside the uterus. A saline solution is put into your uterus. This expands the uterus and allows the surgeon to see the fibroids. Tools are passed through the hysteroscope to remove the fibroid tumor in pieces. ? Laparoscopic myomectomy-A few small cuts (incisions) are made in the lower abdomen. A thin, lighted tube with a tiny camera on the end (laparoscope) is inserted through one of the incisions. This gives the surgeon a good view of the area. The fibroid tumor is removed through the other incisions. The incisions are then closed with stitches (sutures) or staples. ? Abdominal myomectomy-This method is used when the fibroid tumor cannot be removed with a hysteroscope or laparoscope. The surgery is performed through a larger surgical incision in the abdomen. The   fibroid tumor is removed through this incision. The incision is closed with sutures or staples.  What to expect after the procedure  If you had a laparoscopic or hysteroscopic myomectomy, you may be able to go home the same day, or you may need to stay in the hospital overnight.  If you had an abdominal myomectomy, you may need to stay in the hospital for a  few days.  Your IV access tube and catheter will be removed in 1-2 days.  You may be given medicine for pain or to help you sleep.  You may be given an antibiotic medicine, if needed. This information is not intended to replace advice given to you by your health care provider. Make sure you discuss any questions you have with your health care provider. Document Released: 03/29/2007 Document Revised: 11/07/2015 Document Reviewed: 01/11/2013 Elsevier Interactive Patient Education  2017 Elsevier Inc.     Vaginal Hysterectomy A vaginal hysterectomy is a procedure to remove all or part of the uterus through a small incision in the vagina. In this procedure, your health care provider may remove your entire uterus, including the lower end (cervix). You may need a vaginal hysterectomy to treat:  Uterine fibroids.  A condition that causes the lining of the uterus to grow in other areas (endometriosis).  Problems with pelvic support.  Cancer of the cervix, ovaries, uterus, or tissue that lines the uterus (endometrium).  Excessive (dysfunctional) uterine bleeding.  When removing your uterus, your health care provider may also remove the organs that produce eggs (ovaries) and the tubes that carry eggs to your uterus (fallopian tubes). After a vaginal hysterectomy, you will no longer be able to have a baby. You will also no longer get your menstrual period. Tell a health care provider about:  Any allergies you have.  All medicines you are taking, including vitamins, herbs, eye drops, creams, and over-the-counter medicines.  Any problems you or family members have had with anesthetic medicines.  Any blood disorders you have.  Any surgeries you have had.  Any medical conditions you have.  Whether you are pregnant or may be pregnant. What are the risks? Generally, this is a safe procedure. However, problems may occur, including:  Bleeding.  Infection.  A blood clot that forms in  your leg and travels to your lungs (pulmonary embolism).  Damage to surrounding organs.  Pain during sex.  What happens before the procedure?  Ask your health care provider what organs will be removed during surgery.  Ask your health care provider about: ? Changing or stopping your regular medicines. This is especially important if you are taking diabetes medicines or blood thinners. ? Taking medicines such as aspirin and ibuprofen. These medicines can thin your blood. Do not take these medicines before your procedure if your health care provider instructs you not to.  Follow instructions from your health care provider about eating or drinking restrictions.  Do not use any tobacco products, such as cigarettes, chewing tobacco, and e-cigarettes. If you need help quitting, ask your health care provider.  Plan to have someone take you home after discharge from the hospital. What happens during the procedure?  To reduce your risk of infection: ? Your health care team will wash or sanitize their hands. ? Your skin will be washed with soap.  An IV tube will be inserted into one of your veins.  You may be given antibiotic medicine to help prevent infection.  You will be given one or more of  the following: ? A medicine to help you relax (sedative). ? A medicine to numb the area (local anesthetic). ? A medicine to make you fall asleep (general anesthetic). ? A medicine that is injected into an area of your body to numb everything beyond the injection site (regional anesthetic).  Your surgeon will make an incision in your vagina.  Your surgeon will locate and remove all or part of your uterus.  Your ovaries and fallopian tubes may be removed at the same time.  The incision will be closed with stitches (sutures) that dissolve over time. The procedure may vary among health care providers and hospitals. What happens after the procedure?  Your blood pressure, heart rate, breathing  rate, and blood oxygen level will be monitored often until the medicines you were given have worn off.  You will be encouraged to get up and walk around after a few hours to help prevent complications.  You may have IV tubes in place for a few days.  You will be given pain medicine as needed.  Do not drive for 24 hours if you were given a sedative. This information is not intended to replace advice given to you by your health care provider. Make sure you discuss any questions you have with your health care provider. Document Released: 09/23/2015 Document Revised: 11/07/2015 Document Reviewed: 06/16/2015 Elsevier Interactive Patient Education  [employer].

## 2017-01-26 LAB — CYTOLOGY - PAP
Diagnosis: NEGATIVE
HPV: NOT DETECTED

## 2017-02-01 ENCOUNTER — Encounter (HOSPITAL_COMMUNITY): Payer: Self-pay

## 2017-02-01 ENCOUNTER — Ambulatory Visit (HOSPITAL_COMMUNITY)
Admission: RE | Admit: 2017-02-01 | Discharge: 2017-02-01 | Disposition: A | Discharge status: Home / Self Care | Payer: Medicare Other | Source: Ambulatory Visit | Attending: Obstetrics & Gynecology | Admitting: Obstetrics & Gynecology

## 2017-02-01 DIAGNOSIS — N888 Other specified noninflammatory disorders of cervix uteri: Secondary | ICD-10-CM | POA: Diagnosis not present

## 2017-02-01 MED ORDER — GADOBENATE DIMEGLUMINE 529 MG/ML IV SOLN
20.0000 mL | Freq: Once | INTRAVENOUS | Status: AC
  Administered 2017-02-01: 20 mL via INTRAVENOUS

## 2017-02-23 ENCOUNTER — Telehealth: Payer: Self-pay | Admitting: Obstetrics & Gynecology

## 2017-02-23 NOTE — Telephone Encounter (Signed)
Patient is requesting a phone call from the nurse about her MRI results, and if she needs to make a follow-up appointment.

## 2017-03-01 NOTE — Telephone Encounter (Signed)
MRI showed cervical mass, likely fibroid.  Patient needs to come in to discuss upcoming surgery, she needs to be scheduled for appointment in the next 1-2 weeks.  Thank you.

## 2017-03-01 NOTE — Telephone Encounter (Signed)
Notified pt MRI results that they are most likely fibroids and that the provider would like for her have an appt scheduled in 1-2 weeks to discuss upcoming surgery.  I informed her that the front office will contact her with an appt. Pt stated understanding with no further questions.

## 2017-03-01 NOTE — Telephone Encounter (Signed)
Message routed to Dr. Harolyn Rutherford for recommendation.

## 2017-03-19 NOTE — Patient Instructions (Addendum)
Your procedure is scheduled on:  Thursday, Oct. 18 at 3:30 pm  Enter through the Main Entrance of [facility] at:  2 pm  Pick up the phone at the desk and dial [phone].  Call this number if you have problems the morning of surgery: [phone].  Remember: Do NOT eat food after midnight Wednesday, 10/17  Do NOT drink clear liquids (including water) after 9:30 am day of surgery  Take these medicines the morning of surgery with a SIP OF WATER:  none  Do NOT wear jewelry (body piercing), metal hair clips/bobby pins, make-up, or nail polish. Do NOT wear lotions, powders, or perfumes.  You may wear deoderant. Do NOT shave for 48 hours prior to surgery. Do NOT bring valuables to the hospital.  Leave suitcase in car.  After surgery it may be brought to your room.  For patients admitted to the hospital, checkout time is 11:00 AM the day of discharge. Have a responsible adult drive you home and stay with you for 24 hours after your procedure.  Home with friend Merrilee [place] cell [phone].

## 2017-03-22 ENCOUNTER — Encounter (HOSPITAL_COMMUNITY): Payer: Self-pay

## 2017-03-22 ENCOUNTER — Encounter (HOSPITAL_COMMUNITY)
Admission: RE | Admit: 2017-03-22 | Discharge: 2017-03-22 | Disposition: A | Discharge status: Home / Self Care | Payer: Medicare Other | Source: Ambulatory Visit | Attending: Obstetrics & Gynecology | Admitting: Obstetrics & Gynecology

## 2017-03-22 DIAGNOSIS — Z01812 Encounter for preprocedural laboratory examination: Secondary | ICD-10-CM | POA: Insufficient documentation

## 2017-03-22 HISTORY — DX: Headache, unspecified: R51.9

## 2017-03-22 HISTORY — DX: Personal history of other diseases of the digestive system: Z87.19

## 2017-03-22 HISTORY — DX: Headache: R51

## 2017-03-22 LAB — TYPE AND SCREEN
ABO/RH(D): A POS
ANTIBODY SCREEN: NEGATIVE

## 2017-03-22 LAB — CBC
HEMATOCRIT: 35 % — AB (ref 36.0–46.0)
HEMOGLOBIN: 11.4 g/dL — AB (ref 12.0–15.0)
MCH: 30.4 pg (ref 26.0–34.0)
MCHC: 32.6 g/dL (ref 30.0–36.0)
MCV: 93.3 fL (ref 78.0–100.0)
Platelets: 432 10*3/uL — ABNORMAL HIGH (ref 150–400)
RBC: 3.75 MIL/uL — ABNORMAL LOW (ref 3.87–5.11)
RDW: 12.3 % (ref 11.5–15.5)
WBC: 5.4 10*3/uL (ref 4.0–10.5)

## 2017-03-24 ENCOUNTER — Ambulatory Visit: Payer: Medicare Other | Admitting: Obstetrics & Gynecology

## 2017-04-01 ENCOUNTER — Ambulatory Visit (HOSPITAL_COMMUNITY): Payer: Medicare Other | Admitting: Anesthesiology

## 2017-04-01 ENCOUNTER — Encounter (HOSPITAL_COMMUNITY)
Admission: RE | Disposition: A | Discharge status: Home / Self Care | Payer: Self-pay | Source: Ambulatory Visit | Attending: Obstetrics & Gynecology

## 2017-04-01 ENCOUNTER — Ambulatory Visit (HOSPITAL_COMMUNITY)
Admission: RE | Admit: 2017-04-01 | Discharge: 2017-04-01 | Disposition: A | Discharge status: Home / Self Care | Payer: Medicare Other | Source: Ambulatory Visit | Attending: Obstetrics & Gynecology | Admitting: Obstetrics & Gynecology

## 2017-04-01 DIAGNOSIS — Z6841 Body Mass Index (BMI) 40.0 and over, adult: Secondary | ICD-10-CM | POA: Diagnosis not present

## 2017-04-01 DIAGNOSIS — D260 Other benign neoplasm of cervix uteri: Secondary | ICD-10-CM | POA: Insufficient documentation

## 2017-04-01 DIAGNOSIS — G35 Multiple sclerosis: Secondary | ICD-10-CM | POA: Diagnosis not present

## 2017-04-01 DIAGNOSIS — D259 Leiomyoma of uterus, unspecified: Secondary | ICD-10-CM

## 2017-04-01 HISTORY — PX: MYOMECTOMY: SHX85

## 2017-04-01 LAB — PREGNANCY, URINE: Preg Test, Ur: NEGATIVE

## 2017-04-01 SURGERY — MYOMECTOMY, UTERUS, VAGINAL APPROACH
Anesthesia: General | Site: Vagina

## 2017-04-01 MED ORDER — VASOPRESSIN 20 UNIT/ML IV SOLN
INTRAVENOUS | Status: DC | PRN
  Administered 2017-04-01: 5 mL via INTRAMUSCULAR

## 2017-04-01 MED ORDER — PROPOFOL 10 MG/ML IV BOLUS
INTRAVENOUS | Status: AC
  Filled 2017-04-01: qty 20

## 2017-04-01 MED ORDER — OXYCODONE-ACETAMINOPHEN 5-325 MG PO TABS: 1.0000 | ORAL_TABLET | Freq: Four times a day (QID) | ORAL | 0 refills | Status: DC | PRN

## 2017-04-01 MED ORDER — LACTATED RINGERS IV SOLN
INTRAVENOUS | Status: DC
  Administered 2017-04-01 (×2): via INTRAVENOUS

## 2017-04-01 MED ORDER — HYDROMORPHONE HCL 1 MG/ML IJ SOLN
INTRAMUSCULAR | Status: AC
  Filled 2017-04-01: qty 0.5

## 2017-04-01 MED ORDER — IBUPROFEN 600 MG PO TABS: 600.0000 mg | ORAL_TABLET | Freq: Four times a day (QID) | ORAL | 3 refills | Status: DC | PRN

## 2017-04-01 MED ORDER — VASOPRESSIN 20 UNIT/ML IV SOLN
INTRAVENOUS | Status: AC
  Filled 2017-04-01: qty 1

## 2017-04-01 MED ORDER — SCOPOLAMINE 1 MG/3DAYS TD PT72
1.0000 | MEDICATED_PATCH | Freq: Once | TRANSDERMAL | Status: DC
  Administered 2017-04-01: 1.5 mg via TRANSDERMAL

## 2017-04-01 MED ORDER — SCOPOLAMINE 1 MG/3DAYS TD PT72
MEDICATED_PATCH | TRANSDERMAL | Status: AC
  Administered 2017-04-01: 1.5 mg via TRANSDERMAL
  Filled 2017-04-01: qty 1

## 2017-04-01 MED ORDER — OXYCODONE HCL 5 MG PO TABS: 5.0000 mg | ORAL_TABLET | Freq: Once | ORAL | Status: DC | PRN

## 2017-04-01 MED ORDER — MIDAZOLAM HCL 2 MG/2ML IJ SOLN
INTRAMUSCULAR | Status: DC | PRN
  Administered 2017-04-01: 2 mg via INTRAVENOUS

## 2017-04-01 MED ORDER — CEFOTETAN DISODIUM-DEXTROSE 2-2.08 GM-%(50ML) IV SOLR
INTRAVENOUS | Status: AC
  Filled 2017-04-01: qty 50

## 2017-04-01 MED ORDER — LIDOCAINE HCL (CARDIAC) 20 MG/ML IV SOLN
INTRAVENOUS | Status: DC | PRN
  Administered 2017-04-01: 60 mg via INTRAVENOUS

## 2017-04-01 MED ORDER — HYDROMORPHONE HCL 1 MG/ML IJ SOLN
0.2500 mg | INTRAMUSCULAR | Status: DC | PRN
  Administered 2017-04-01: 0.5 mg via INTRAVENOUS

## 2017-04-01 MED ORDER — MIDAZOLAM HCL 2 MG/2ML IJ SOLN
INTRAMUSCULAR | Status: AC
  Filled 2017-04-01: qty 2

## 2017-04-01 MED ORDER — DEXAMETHASONE SODIUM PHOSPHATE 10 MG/ML IJ SOLN
INTRAMUSCULAR | Status: DC | PRN
  Administered 2017-04-01: 4 mg via INTRAVENOUS

## 2017-04-01 MED ORDER — ONDANSETRON HCL 4 MG/2ML IJ SOLN
INTRAMUSCULAR | Status: DC | PRN
  Administered 2017-04-01: 4 mg via INTRAVENOUS

## 2017-04-01 MED ORDER — SODIUM CHLORIDE 0.9 % IJ SOLN
INTRAMUSCULAR | Status: AC
  Filled 2017-04-01: qty 50

## 2017-04-01 MED ORDER — DOCUSATE SODIUM 100 MG PO CAPS: 100.0000 mg | ORAL_CAPSULE | Freq: Two times a day (BID) | ORAL | 2 refills | Status: DC | PRN

## 2017-04-01 MED ORDER — BUPIVACAINE-EPINEPHRINE (PF) 0.5% -1:200000 IJ SOLN
INTRAMUSCULAR | Status: AC
  Filled 2017-04-01: qty 30

## 2017-04-01 MED ORDER — KETOROLAC TROMETHAMINE 30 MG/ML IJ SOLN
INTRAMUSCULAR | Status: DC | PRN
  Administered 2017-04-01: 30 mg via INTRAVENOUS

## 2017-04-01 MED ORDER — GLYCOPYRROLATE 0.2 MG/ML IJ SOLN
INTRAMUSCULAR | Status: DC | PRN
  Administered 2017-04-01: 0.2 mg via INTRAVENOUS

## 2017-04-01 MED ORDER — ONDANSETRON HCL 4 MG/2ML IJ SOLN: 4.0000 mg | Freq: Four times a day (QID) | INTRAMUSCULAR | Status: DC | PRN

## 2017-04-01 MED ORDER — SCOPOLAMINE 1 MG/3DAYS TD PT72
MEDICATED_PATCH | TRANSDERMAL | Status: AC
  Filled 2017-04-01: qty 1

## 2017-04-01 MED ORDER — CEFOTETAN DISODIUM-DEXTROSE 2-2.08 GM-%(50ML) IV SOLR
2.0000 g | INTRAVENOUS | Status: AC
  Administered 2017-04-01: 2 g via INTRAVENOUS

## 2017-04-01 MED ORDER — LIDOCAINE HCL (CARDIAC) 20 MG/ML IV SOLN
INTRAVENOUS | Status: AC
  Filled 2017-04-01: qty 5

## 2017-04-01 MED ORDER — FENTANYL CITRATE (PF) 100 MCG/2ML IJ SOLN
INTRAMUSCULAR | Status: AC
  Filled 2017-04-01: qty 2

## 2017-04-01 MED ORDER — FENTANYL CITRATE (PF) 100 MCG/2ML IJ SOLN
INTRAMUSCULAR | Status: DC | PRN
Start: 2017-04-01 — End: 2017-04-01
  Administered 2017-04-01: 100 ug via INTRAVENOUS

## 2017-04-01 MED ORDER — PROPOFOL 10 MG/ML IV BOLUS
INTRAVENOUS | Status: DC | PRN
  Administered 2017-04-01: 200 mg via INTRAVENOUS

## 2017-04-01 MED ORDER — OXYCODONE HCL 5 MG/5ML PO SOLN
5.0000 mg | Freq: Once | ORAL | Status: DC | PRN
Start: 2017-04-01 — End: 2017-04-01

## 2017-04-01 SURGICAL SUPPLY — 27 items
CANISTER SUCT 3000ML PPV (MISCELLANEOUS) ×4 IMPLANT
CLOTH BEACON ORANGE TIMEOUT ST (SAFETY) ×4 IMPLANT
CONT PATH 16OZ SNAP LID 3702 (MISCELLANEOUS) ×3 IMPLANT
DECANTER SPIKE VIAL GLASS SM (MISCELLANEOUS) ×7 IMPLANT
GAUZE PACKING 2X5 YD STRL (GAUZE/BANDAGES/DRESSINGS) IMPLANT
GLOVE BIOGEL PI IND STRL 6.5 (GLOVE) ×2 IMPLANT
GLOVE BIOGEL PI IND STRL 7.0 (GLOVE) ×4 IMPLANT
GLOVE BIOGEL PI INDICATOR 6.5 (GLOVE) ×2
GLOVE BIOGEL PI INDICATOR 7.0 (GLOVE) ×4
GLOVE ECLIPSE 7.0 STRL STRAW (GLOVE) ×4 IMPLANT
GOWN STRL REUS W/TWL LRG LVL3 (GOWN DISPOSABLE) ×16 IMPLANT
NDL SPNL 22GX3.5 QUINCKE BK (NEEDLE) IMPLANT
NEEDLE HYPO 22GX1.5 SAFETY (NEEDLE) IMPLANT
NEEDLE SPNL 22GX3.5 QUINCKE BK (NEEDLE) ×4 IMPLANT
NS IRRIG 1000ML POUR BTL (IV SOLUTION) ×4 IMPLANT
PACK TRENDGUARD 600 HYBRD PROC (MISCELLANEOUS) IMPLANT
PACK VAGINAL WOMENS (CUSTOM PROCEDURE TRAY) ×4 IMPLANT
PAD OB MATERNITY 4.3X12.25 (PERSONAL CARE ITEMS) ×4 IMPLANT
SUT MON AB 2-0 CT1 36 (SUTURE) ×3 IMPLANT
SUT VIC AB 0 CT1 18XCR BRD8 (SUTURE) ×5 IMPLANT
SUT VIC AB 0 CT1 27 (SUTURE)
SUT VIC AB 0 CT1 27XBRD ANBCTR (SUTURE) ×2 IMPLANT
SUT VIC AB 0 CT1 8-18 (SUTURE) ×8
SUT VICRYL 0 TIES 12 18 (SUTURE) ×4 IMPLANT
TOWEL OR 17X24 6PK STRL BLUE (TOWEL DISPOSABLE) ×8 IMPLANT
TRAY FOLEY CATH SILVER 14FR (SET/KITS/TRAYS/PACK) ×4 IMPLANT
TRENDGUARD 600 HYBRID PROC PK (MISCELLANEOUS)

## 2017-04-01 NOTE — H&P (Signed)
Preoperative History and Physical  [name] is a 31 y.o. [id number] here for surgical management of cervical fibroid.   No significant preoperative concerns.  Proposed surgery: Transvaginal removal of cervical fibroid, possible vaginal hysterectomy.  Past Medical History:  Diagnosis Date  . Abnormal Pap smear    Colpo>normal  . Gonorrhea   . Headache    last migraine 01/2017  . History of IBS    watch diet, no meds  . Multiple sclerosis ([place])   . Neuromuscular disorder [facility])    diagnosed with MS this visit  . Obesity   . Ovarian cyst   . Pernicious anemia 05/02/2013  . Preterm labor    all 3 deliveries  . SVD (spontaneous vaginal delivery)    x 3  . Urinary tract infection   . Vaginal Pap smear, abnormal    Past Surgical History:  Procedure Laterality Date  . COLPOSCOPY    . TUBAL LIGATION     2012  . WISDOM TOOTH EXTRACTION     OB History  Gravida Para Term Preterm AB Living  6 3 1 2 3 4   SAB TAB Ectopic Multiple Live Births    3   1 2     # Outcome Date GA Lbr Len/2nd Weight Sex Delivery Anes PTL Lv  6 Term           5A Preterm         LIV  5B Preterm         LIV  4 Preterm           3 TAB           2 TAB           1 TAB             Patient denies any other pertinent gynecologic issues.   No current facility-administered medications on file prior to encounter.    Current Outpatient Prescriptions on File Prior to Encounter  Medication Sig Dispense Refill  . ocrelizumab (OCREVUS) 300 MG/10ML injection Inject 600 mg into the vein every 6 (six) months.     No Known Allergies  Social History:   reports that she has never smoked. She has never used smokeless tobacco. She reports that she does not drink alcohol or use drugs.  Family History  Problem Relation Age of Onset  . Diabetes Mother   . Hypertension Mother   . Diabetes Father   . Hypertension Father   . Anesthesia problems Neg Hx   . Other Neg Hx     Review of Systems:  Noncontributory  PHYSICAL EXAM: Blood pressure (!) 146/90, pulse 72, temperature 97.7 F (36.5 C), temperature source Oral, resp. rate 18, last menstrual period 03/13/2017, SpO2 100 %. CONSTITUTIONAL: Well-developed, well-nourished female in no acute distress.  HENT:  Normocephalic, atraumatic, External right and left ear normal. Oropharynx is clear and moist EYES: Conjunctivae and EOM are normal. Pupils are equal, round, and reactive to light. No scleral icterus.  NECK: Normal range of motion, supple, no masses SKIN: Skin is warm and dry. No rash noted. Not diaphoretic. No erythema. No pallor. NEUROLOGIC: Alert and oriented to person, place, and time. Normal reflexes, muscle tone coordination. No cranial nerve deficit noted. PSYCHIATRIC: Normal mood and affect. Normal behavior. Normal judgment and thought content. CARDIOVASCULAR: Normal heart rate noted, regular rhythm RESPIRATORY: Effort and breath sounds normal, no problems with respiration noted ABDOMEN: Soft, nontender, nondistended. PELVIC: Deferred MUSCULOSKELETAL: Normal range of motion. No edema  and no tenderness. 2+ distal pulses.  Labs: Results for orders placed or performed during the hospital encounter of 04/01/17 (from the past 336 hour(s))  Pregnancy, urine   Collection Time: 04/01/17  1:00 PM  Result Value Ref Range   Preg Test, Ur NEGATIVE NEGATIVE  Results for orders placed or performed during the hospital encounter of 03/22/17 (from the past 336 hour(s))  CBC   Collection Time: 03/22/17  1:29 PM  Result Value Ref Range   WBC 5.4 4.0 - 10.5 K/uL   RBC 3.75 (L) 3.87 - 5.11 MIL/uL   Hemoglobin 11.4 (L) 12.0 - 15.0 g/dL   HCT 35.0 (L) 36.0 - 46.0 %   MCV 93.3 78.0 - 100.0 fL   MCH 30.4 26.0 - 34.0 pg   MCHC 32.6 30.0 - 36.0 g/dL   RDW 12.3 11.5 - 15.5 %   Platelets 432 (H) 150 - 400 K/uL  Type and screen   Collection Time: 03/22/17  1:29 PM  Result Value Ref Range   ABO/RH(D) A POS    Antibody Screen NEG     Sample Expiration 04/05/2017    Extend sample reason NO TRANSFUSIONS OR PREGNANCY IN THE PAST 3 MONTHS     Imaging Studies: 12/22/2016 TRANSABDOMINAL AND TRANSVAGINAL ULTRASOUND OF PELVIS CLINICAL DATA: Dyspareunia, painful pelvic exam, cervical mass COMPARISON: 03/11/2015 FINDINGS: Uterus Measurements: 10.2 x 5.1 x 5.0 cm. Focal mass identified at LEFT lateral aspect of cervix, 2.5 x 3.2 x 2.3 cm. No additional uterine masses. Nabothian cysts at cervix as well. Endometrium Thickness: 11 mm thick, normal. No endometrial fluid or focal abnormality Right ovary Measurements: 2.9 x 1.8 x 1.6 cm. Normal morphology without mass Left ovary Measurements: 3.0 x 1.5 x 1.3 cm. Normal morphology without mass Other findings No free pelvic fluid or additional pelvic masses. IMPRESSION:  Focal mass identified at LEFT lateral aspect of cervix, 2.5 x 3.2 x2.3 cm in size, could represent a cervical neoplasm or a low uterine leiomyoma. Recommend correlation with direct visualization of the cervix and consider MR imaging for further assessment.   Assessment: Patient Active Problem List   Diagnosis Date Noted  . Cervical mass 12/23/2016  . Child behavior problem 03/27/2016  . Breast mass 02/09/2016  . B12 deficiency 10/04/2013  . Unspecified vitamin D deficiency 10/04/2013  . Malabsorption 08/29/2013  . 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 Insomnia 06/27/2013  . Adjustment disorder with mixed anxiety and depressed mood 03/13/2013  . Multiple sclerosis ([place]) 03/13/2013  . Abdominal pain 07/29/2007  . OBESITY, NOS 08/12/2006    Plan: Patient will undergo surgical management with transvaginal removal of cervical fibroid, possible vaginal hysterectomy.   The risks of surgery were discussed in detail with the patient including but not limited to: bleeding which may require transfusion or reoperation; infection which may require antibiotics; injury to surrounding organs which  may involve bowel, bladder, ureters ; need for additional procedures including laparoscopy or laparotomy; thromboembolic phenomenon, surgical site problems and other postoperative/anesthesia complications. Likelihood of success in alleviating the patient's condition was discussed. Routine postoperative instructions will be reviewed with the patient and her family in detail after surgery.  The patient concurred with the proposed plan, giving informed written consent for the surgery.  Patient has been NPO since last night and she will remain NPO for procedure.  Anesthesia and OR aware.  Preoperative prophylactic antibiotics and SCDs ordered on call to the OR.  To OR when ready.  Verita Schneiders, M.D. 04/01/2017 2:26  PM

## 2017-04-01 NOTE — Discharge Instructions (Addendum)
Vaginal Myomectomy, Care After Refer to this sheet in the next few weeks. These instructions provide you with information about caring for yourself after your procedure. Your health care provider may also give you more specific instructions. Your treatment has been planned according to current medical practices, but problems sometimes occur. Call your health care provider if you have any problems or questions after your procedure. What can I expect after the procedure? After the procedure, it is common to have:  Pain.  Soreness and numbness in your incision areas.  Vaginal bleeding and discharge.  Constipation.  Temporary problems emptying the bladder.  Follow these instructions at home: Medicines  Take over-the-counter and prescription medicines only as told by your health care provider.  If you were prescribed an antibiotic medicine, take it as told by your health care provider. Do not stop taking the antibiotic even if you start to feel better.  Do not drive or operate heavy machinery while taking prescription pain medicine. Activity  Return to your normal activities as told by your health care provider. Ask your health care provider what activities are safe for you.  Get regular exercise as told by your health care provider. You may be told to take short walks every day and go farther each time.  General instructions  Do not put anything in your vagina for 3 weeks after your surgery or as told by your health care provider. This includes tampons and douches.  Do not have sex until 3 weeks.  Do not take baths, swim, or use a hot tub for 3 weeks.  Drink enough fluid to keep your urine clear or pale yellow.  Do not drive for 24 hours if you were given a sedative.  Keep all follow-up visits as told by your health care provider. This is important. Contact a health care provider if:  Your pain medicine is not helping.  You have a fever.  You have redness, swelling, or  pain at your incision site.  You have blood, pus, or a bad-smelling discharge from your vagina.  You continue to have difficulty urinating. Get help right away/come to MAU if:  You have severe abdominal or back pain.  You have heavy bleeding from your vagina.  You have chest pain or shortness of breath. This information is not intended to replace advice given to you by your health care provider. Make sure you discuss any questions you have with your health care provider.   Post Anesthesia Home Care Instructions  Activity: Get plenty of rest for the remainder of the day. A responsible individual must stay with you for 24 hours following the procedure.  For the next 24 hours, DO NOT: -Drive a car -Paediatric nurse -Drink alcoholic beverages -Take any medication unless instructed by your physician -Make any legal decisions or sign important papers.  Meals: Start with liquid foods such as gelatin or soup. Progress to regular foods as tolerated. Avoid greasy, spicy, heavy foods. If nausea and/or vomiting occur, drink only clear liquids until the nausea and/or vomiting subsides. Call your physician if vomiting continues.  Special Instructions/Symptoms: Your throat may feel dry or sore from the anesthesia or the breathing tube placed in your throat during surgery. If this causes discomfort, gargle with warm salt water. The discomfort should disappear within 24 hours.  If you had a scopolamine patch placed behind your ear for the management of post- operative nausea and/or vomiting:  1. The medication in the patch is effective for 72 hours, after  which it should be removed.  Wrap patch in a tissue and discard in the trash. Wash hands thoroughly with soap and water. 2. You may remove the patch earlier than 72 hours if you experience unpleasant side effects which may include dry mouth, dizziness or visual disturbances. 3. Avoid touching the patch. Wash your hands with soap and water after  contact with the patch.

## 2017-04-01 NOTE — Op Note (Addendum)
[name] PROCEDURE DATE: 04/01/2017  PREOPERATIVE DIAGNOSIS: Symptomatic cervical leioyoma POSTOPERATIVE DIAGNOSIS: The same PROCEDURE: Vaginal Myomectomy SURGEON:  Dr. Verita Schneiders ASSISTANT: Dr. Darron Doom  INDICATIONS: 31 y.o. [id number] here for vaginal myomectomy for a symptomatic cervical leiomyoma.  Risks of surgery were discussed with the patient including but not limited to: bleeding which may require transfusion or reoperation and hysterectomy (3-4% risk of intraoperative conversion to hysterectomy); infection which may require antibiotics; injury to bowel, bladder, ureters or other surrounding organs; need for additional procedures; and other postoperative/anesthesia complications.   Written informed consent was obtained.    FINDINGS:  3 cm subserosal cervical leiomyoma in left lateral aspect of cervix, visible in vaginal portion of cervix.  Cervical canal was not breached; also did not breach parametrium. Total procedure occurred within left lateral side of cervix.  ANESTHESIA:  General ESTIMATED BLOOD LOSS: 10 ml SPECIMENS: Leiomyoma sent to pathology COMPLICATIONS: None immediate  PROCEDURE IN DETAIL:  The patient received preoperative antibiotics approximately 30 minutes prior to procedure.  She was then taken to the operating room and general anesthesia was administered without difficulty and SCDs were placed on the lower extremities.   She was placed in dorsal supine position and prepped and draped in a sterile manner.  A Foley catheter was inserted into the bladder and attached to constant drainage.  After an adequate timeout was performed, attention was turned to her pelvis.  A weighted speculum was then placed in the vagina, and the anterior lip of the cervix was grasped with a tenaculum.  The fibroid was identified easily.  Vasopressin solution was injected over the surface of the leiomyoma to aid with hemostasis.   A vertical incision was made over the cervical  leiomyoma into the leiomyoma, and the capsule was recognized.  Using blunt methods, the leiomyoma was freed from the surrounding cervical tissue and removed intact. The incision was closed in multiple layers using 0 Vicryl running interlocking stitches, and the serosa was reapproximated using a 2-0 Monocryl running, interlocking stitch. Overall good hemostasis was noted. All instruments were then removed from the vagina  The patient tolerated the procedure well.  All instruments, needles, and sponge counts were correct x 2. The patient was taken to the recovery room in stable condition.  The patient will be discharged to home as per PACU criteria.  Routine postoperative instructions given.  She was prescribed Percocet, Ibuprofen and Colace.  She will follow up in the clinic in 3-4 weeks for postoperative evaluation .     Verita Schneiders, MD, [place] Attending [facility], [facility]

## 2017-04-01 NOTE — Anesthesia Postprocedure Evaluation (Signed)
Anesthesia Post Note  Patient: [name]  Procedure(s) Performed: Vaginal Myomectomy (N/A Vagina )     Patient location during evaluation: PACU Anesthesia Type: General Level of consciousness: awake and alert Pain management: pain level controlled Vital Signs Assessment: post-procedure vital signs reviewed and stable Respiratory status: spontaneous breathing, nonlabored ventilation, respiratory function stable and patient connected to nasal cannula oxygen Cardiovascular status: blood pressure returned to baseline and stable Postop Assessment: no apparent nausea or vomiting Anesthetic complications: no    Last Vitals:  Vitals:   04/01/17 1615 04/01/17 1650  BP: 134/82 129/80  Pulse: 61 65  Resp: 16 18  Temp: 36.7 C 36.9 C  SpO2: 98% 100%    Last Pain:  Vitals:   04/01/17 1650  TempSrc:   PainSc: 2    Pain Goal: Patients Stated Pain Goal: 4 (04/01/17 1650)               Claudia Greenley S

## 2017-04-01 NOTE — Transfer of Care (Signed)
Immediate Anesthesia Transfer of Care Note  Patient: [name]  Procedure(s) Performed: Vaginal Myomectomy (N/A Vagina )  Patient Location: PACU  Anesthesia Type:General  Level of Consciousness: awake, alert , oriented, drowsy and patient cooperative  Airway & Oxygen Therapy: Patient Spontanous Breathing and Patient connected to nasal cannula oxygen  Post-op Assessment: Report given to RN and Post -op Vital signs reviewed and stable  Post vital signs: Reviewed and stable  Last Vitals:  Vitals:   04/01/17 1313  BP: (!) 146/90  Pulse: 72  Resp: 18  Temp: 36.5 C  SpO2: 100%    Last Pain:  Vitals:   04/01/17 1313  TempSrc: Oral      Patients Stated Pain Goal: 4 (48/01/65 5374)  Complications: No apparent anesthesia complications

## 2017-04-01 NOTE — Anesthesia Preprocedure Evaluation (Signed)
Anesthesia Evaluation  Patient identified by MRN, date of birth, ID band Patient awake    Reviewed: Allergy & Precautions, H&P , NPO status , Patient's Chart, lab work & pertinent test results  Airway Mallampati: II   Neck ROM: full    Dental   Pulmonary neg pulmonary ROS,    breath sounds clear to auscultation       Cardiovascular negative cardio ROS   Rhythm:regular Rate:Normal     Neuro/Psych  Headaches, Multiple sclerosis  Neuromuscular disease    GI/Hepatic   Endo/Other  Morbid obesity  Renal/GU      Musculoskeletal   Abdominal   Peds  Hematology   Anesthesia Other Findings   Reproductive/Obstetrics                             Anesthesia Physical Anesthesia Plan  ASA: II  Anesthesia Plan: General   Post-op Pain Management:    Induction: Intravenous  PONV Risk Score and Plan: 3 and Ondansetron, Dexamethasone, Midazolam and Treatment may vary due to age or medical condition  Airway Management Planned: LMA  Additional Equipment:   Intra-op Plan:   Post-operative Plan:   Informed Consent: I have reviewed the patients History and Physical, chart, labs and discussed the procedure including the risks, benefits and alternatives for the proposed anesthesia with the patient or authorized representative who has indicated his/her understanding and acceptance.     Plan Discussed with: CRNA, Anesthesiologist and Surgeon  Anesthesia Plan Comments:         Anesthesia Quick Evaluation

## 2017-04-01 NOTE — Anesthesia Procedure Notes (Signed)
Procedure Name: LMA Insertion Date/Time: 04/01/2017 2:44 PM Performed by: Jonna Munro Pre-anesthesia Checklist: Patient identified, Emergency Drugs available, Suction available, Patient being monitored and Timeout performed Patient Re-evaluated:Patient Re-evaluated prior to induction Oxygen Delivery Method: Circle system utilized Preoxygenation: Pre-oxygenation with 100% oxygen Induction Type: IV induction LMA: LMA with gastric port inserted LMA Size: 4.0 Number of attempts: 1 Placement Confirmation: positive ETCO2 and breath sounds checked- equal and bilateral Tube secured with: Tape Dental Injury: Teeth and Oropharynx as per pre-operative assessment

## 2017-04-02 ENCOUNTER — Encounter (HOSPITAL_COMMUNITY): Payer: Self-pay | Admitting: Obstetrics & Gynecology

## 2017-04-28 ENCOUNTER — Other Ambulatory Visit (HOSPITAL_COMMUNITY)
Admission: RE | Admit: 2017-04-28 | Discharge: 2017-04-28 | Disposition: A | Discharge status: Home / Self Care | Payer: Medicare Other | Source: Ambulatory Visit | Attending: Obstetrics & Gynecology | Admitting: Obstetrics & Gynecology

## 2017-04-28 ENCOUNTER — Ambulatory Visit (INDEPENDENT_AMBULATORY_CARE_PROVIDER_SITE_OTHER): Payer: Medicare Other | Admitting: Obstetrics & Gynecology

## 2017-04-28 ENCOUNTER — Encounter: Payer: Self-pay | Admitting: Obstetrics & Gynecology

## 2017-04-28 VITALS — BP 133/85 | HR 72 | Ht 64.0 in | Wt 234.9 lb

## 2017-04-28 DIAGNOSIS — Z09 Encounter for follow-up examination after completed treatment for conditions other than malignant neoplasm: Secondary | ICD-10-CM | POA: Insufficient documentation

## 2017-04-28 DIAGNOSIS — Z113 Encounter for screening for infections with a predominantly sexual mode of transmission: Secondary | ICD-10-CM | POA: Diagnosis not present

## 2017-04-28 DIAGNOSIS — N898 Other specified noninflammatory disorders of vagina: Secondary | ICD-10-CM

## 2017-04-28 DIAGNOSIS — D260 Other benign neoplasm of cervix uteri: Secondary | ICD-10-CM | POA: Diagnosis present

## 2017-04-28 NOTE — Patient Instructions (Signed)
Return to clinic for any scheduled appointments or for any gynecologic concerns as needed.   

## 2017-04-28 NOTE — Progress Notes (Signed)
Subjective:   [name] is a 31 y.o. [id number] female who presents to the clinic 4 weeks status post vaginal myomectomy for a cervical fibroid. Eating a regular diet without difficulty. Bowel movements are normal. The patient is not having any pain.  The following portions of the patient's history were reviewed and updated as appropriate: allergies, current medications, past family history, past medical history, past social history, past surgical history and problem list.  Normal pap 01/21/2017.  Review of Systems Pertinent items noted in HPI and remainder of comprehensive ROS otherwise negative.    Objective:    BP 133/85   Pulse 72   Ht 5\' 4"  (1.626 m)   Wt 234 lb 14.4 oz (106.5 kg)   LMP 04/10/2017 (Exact Date)   BMI 40.32 kg/m  General:  alert and no distress  Abdomen: soft, bowel sounds active, non-tender  Cervical Incision:   healing well, no drainage, no dehiscence, incision well approximated. Yellow, malodorous vaginal discharge, testing sample obtained.    04/05/17 Surgical Diagnosis Fibroid, cervical - LEIOMYOMA. - NO EVIDENCE OF MALIGNANCY. Assessment:  Doing well postoperatively. Operative findings again reviewed. Pathology report discussed. Vaginal discharge on exam.   Plan:  1. Continue any current medications. 2. Wound care discussed.  Will follow up vaginal discharge test and manage accordingly.  3. Activity restrictions: none 4. Anticipated return to work: not applicable. 5. Follow up as needed.    Verita Schneiders, MD, [place] Attending [place], [facility] for [employer], [place]

## 2017-04-30 LAB — CERVICOVAGINAL ANCILLARY ONLY
Bacterial vaginitis: NEGATIVE
CHLAMYDIA, DNA PROBE: NEGATIVE
Candida vaginitis: NEGATIVE
NEISSERIA GONORRHEA: NEGATIVE
TRICH (WINDOWPATH): NEGATIVE

## 2017-05-16 ENCOUNTER — Emergency Department (HOSPITAL_COMMUNITY)
Admission: EM | Admit: 2017-05-16 | Discharge: 2017-05-16 | Disposition: A | Discharge status: Home / Self Care | Payer: Medicare Other | Attending: Emergency Medicine | Admitting: Emergency Medicine

## 2017-05-16 ENCOUNTER — Encounter (HOSPITAL_COMMUNITY): Payer: Self-pay | Admitting: Emergency Medicine

## 2017-05-16 DIAGNOSIS — F4323 Adjustment disorder with mixed anxiety and depressed mood: Secondary | ICD-10-CM | POA: Diagnosis not present

## 2017-05-16 DIAGNOSIS — G35 Multiple sclerosis: Secondary | ICD-10-CM | POA: Diagnosis not present

## 2017-05-16 DIAGNOSIS — Z79899 Other long term (current) drug therapy: Secondary | ICD-10-CM | POA: Diagnosis not present

## 2017-05-16 DIAGNOSIS — R202 Paresthesia of skin: Secondary | ICD-10-CM | POA: Diagnosis present

## 2017-05-16 MED ORDER — KETOROLAC TROMETHAMINE 30 MG/ML IJ SOLN
30.0000 mg | Freq: Once | INTRAMUSCULAR | Status: DC
  Filled 2017-05-16: qty 1

## 2017-05-16 MED ORDER — METHYLPREDNISOLONE SODIUM SUCC 125 MG IJ SOLR
125.0000 mg | Freq: Once | INTRAMUSCULAR | Status: DC
  Filled 2017-05-16: qty 2

## 2017-05-16 MED ORDER — PROCHLORPERAZINE EDISYLATE 5 MG/ML IJ SOLN
10.0000 mg | Freq: Once | INTRAMUSCULAR | Status: DC
  Filled 2017-05-16: qty 2

## 2017-05-16 MED ORDER — SODIUM CHLORIDE 0.9 % IV BOLUS (SEPSIS): 500.0000 mL | Freq: Once | INTRAVENOUS | Status: DC

## 2017-05-16 MED ORDER — KETOROLAC TROMETHAMINE 60 MG/2ML IM SOLN
30.0000 mg | Freq: Once | INTRAMUSCULAR | Status: AC
Start: 2017-05-16 — End: 2017-05-16
  Administered 2017-05-16: 30 mg via INTRAMUSCULAR

## 2017-05-16 MED ORDER — METHYLPREDNISOLONE SODIUM SUCC 125 MG IJ SOLR
125.0000 mg | Freq: Once | INTRAMUSCULAR | Status: AC
  Administered 2017-05-16: 125 mg via INTRAMUSCULAR

## 2017-05-16 MED ORDER — PREDNISONE 10 MG PO TABS: ORAL_TABLET | ORAL | 0 refills | Status: DC

## 2017-05-16 NOTE — ED Notes (Signed)
Unsuccessful IV attempt by this RN 

## 2017-05-16 NOTE — ED Provider Notes (Signed)
[facility] DEPT Provider Note   CSN: [id number] Arrival date & time: 05/16/17  1317     History   Chief Complaint Chief Complaint  Patient presents with  . MS Pain    HPI [name] is a 31 y.o. female.  The history is provided by the patient and medical records. No language interpreter was used.   [name] is a 31 y.o. female  with a PMH of multiple sclerosis who presents to the Emergency Department complaining of bilateral upper and lower extremity tingling and burning c/w her typical MS flares. Patient states that this has been going on for 3-4 days now. Sometimes, burning pain will go away on it's own, so she waited a few days, hoping symptoms would resolve, however pain is getting worse. Associated with headaches. Denies visual changes, neck pain, numbness or weakness. No fever, chills, cough, congestion, chest pain, shortness of breath, abdominal pain, n/v. No medications taken prior to arrival for symptoms other than her typical home gabapentin which she takes TID. She states that she typically calls her doctor when this happens so they can call in rx for steroids, however given it is the weekend, she came to ER instead. She notes that she did call and leave a message at the clinic, so she is hopeful they will call her in the morning when their office opens.   Past Medical History:  Diagnosis Date  . Abnormal Pap smear    Colpo>normal  . Gonorrhea   . Headache    last migraine 01/2017  . History of IBS    watch diet, no meds  . Multiple sclerosis ([place])   . Neuromuscular disorder [facility])    diagnosed with MS this visit  . Obesity   . Ovarian cyst   . Pernicious anemia 05/02/2013  . Preterm labor    all 3 deliveries  . SVD (spontaneous vaginal delivery)    x 3  . Urinary tract infection   . Vaginal Pap smear, abnormal     Patient Active Problem List   Diagnosis Date Noted  . Fibroid of cervix   . Cervical mass 12/23/2016    . Child behavior problem 03/27/2016  . Breast mass 02/09/2016  . B12 deficiency 10/04/2013  . Unspecified vitamin D deficiency 10/04/2013  . Malabsorption 08/29/2013  . 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 Insomnia 06/27/2013  . Adjustment disorder with mixed anxiety and depressed mood 03/13/2013  . Multiple sclerosis ([place]) 03/13/2013  . Abdominal pain 07/29/2007  . OBESITY, NOS 08/12/2006    Past Surgical History:  Procedure Laterality Date  . COLPOSCOPY    . MYOMECTOMY N/A 04/01/2017   Procedure: Vaginal Myomectomy;  Surgeon: Osborne Oman, MD;  Location: [facility];  Service: Gynecology;  Laterality: N/A;  . TUBAL LIGATION     2012  . WISDOM TOOTH EXTRACTION      OB History    Gravida Para Term Preterm AB Living   6 3 1 2 3 4    SAB TAB Ectopic Multiple Live Births     3   1 2        Home Medications    Prior to Admission medications   Medication Sig Start Date End Date Taking? Authorizing Provider  docusate sodium (COLACE) 100 MG capsule Take 1 capsule (100 mg total) by mouth 2 (two) times daily as needed. 04/01/17   Anyanwu, Sallyanne Havers, MD  ibuprofen (ADVIL,MOTRIN) 600 MG tablet Take 1 tablet (600 mg  total) by mouth every 6 (six) hours as needed. 04/01/17   Anyanwu, Sallyanne Havers, MD  ocrelizumab (OCREVUS) 300 MG/10ML injection Inject 600 mg into the vein every 6 (six) months.    [provider]  oxyCODONE-acetaminophen (PERCOCET/ROXICET) 5-325 MG tablet Take 1 tablet by mouth every 6 (six) hours as needed for severe pain. Patient not taking: Reported on 04/28/2017 04/01/17   Anyanwu, Sallyanne Havers, MD  predniSONE (DELTASONE) 10 MG tablet Take six tablets a day for two days, Then five tablets a day for two days, Then four tablets a day for two days, Then three tablets a day for two days, Then two tablets a day for two days, Then one tablet a day for two days 05/16/17   Jerrie Gullo, Ozella Almond, PA-C    Family History Family History  Problem Relation  Age of Onset  . Diabetes Mother   . Hypertension Mother   . Diabetes Father   . Hypertension Father   . Anesthesia problems Neg Hx   . Other Neg Hx     Social History Social History   Tobacco Use  . Smoking status: Never Smoker  . Smokeless tobacco: Never Used  Substance Use Topics  . Alcohol use: No    Alcohol/week: 0.0 oz    Comment:    . Drug use: No     Allergies   Patient has no known allergies.   Review of Systems Review of Systems  Eyes: Negative for visual disturbance.  Musculoskeletal: Negative for neck pain.  Neurological: Positive for headaches. Negative for weakness and numbness.       + tingling, burning.  All other systems reviewed and are negative.    Physical Exam Updated Vital Signs BP (!) 148/95   Pulse 71   Temp 98.4 F (36.9 C) (Oral)   Resp 18   Ht 5\' 4"  (1.626 m)   Wt 106.6 kg (235 lb)   LMP 05/09/2017   SpO2 100%   BMI 40.34 kg/m   Physical Exam  Constitutional: She is oriented to person, place, and time. She appears well-developed and well-nourished. No distress.  HENT:  Head: Normocephalic and atraumatic.  Neck:  No midline or paraspinal tenderness.  Cardiovascular: Normal rate, regular rhythm and normal heart sounds.  No murmur heard. Pulmonary/Chest: Effort normal and breath sounds normal. No respiratory distress.  Abdominal: Soft. She exhibits no distension. There is no tenderness.  Musculoskeletal:  5/5 muscle strength and full ROM in all four extremities.  Neurological: She is alert and oriented to person, place, and time.  Alert, oriented, thought content appropriate, able to give a coherent history. Speech is clear and goal oriented, able to follow commands.  Cranial Nerves:  II:  Peripheral visual fields grossly normal, pupils equal, round, reactive to light III, IV, VI: EOM intact bilaterally, ptosis not present V,VII: smile symmetric, eyes kept closed tightly against resistance, facial light touch sensation  equal VIII: hearing grossly normal IX, X: symmetric soft palate movement, uvula elevates symmetrically  XI: bilateral shoulder shrug symmetric and strong XII: midline tongue extension Sensory to light touch intact and equal in all four extremities, however does reproduce a burning sensation with light touch. Normal finger-to-nose and rapid alternating movements; normal gait and balance. No drift.  Skin: Skin is warm and dry.  Nursing note and vitals reviewed.    ED Treatments / Results  Labs (all labs ordered are listed, but only abnormal results are displayed) Labs Reviewed - No data to display  EKG  EKG Interpretation None       Radiology No results found.  Procedures Procedures (including critical care time)  Medications Ordered in ED Medications  ketorolac (TORADOL) injection 30 mg (30 mg Intramuscular Given 05/16/17 1650)  methylPREDNISolone sodium succinate (SOLU-MEDROL) 125 mg/2 mL injection 125 mg (125 mg Intramuscular Given 05/16/17 1650)     Initial Impression / Assessment and Plan / ED Course  I have reviewed the triage vital signs and the nursing notes.  Pertinent labs & imaging results that were available during my care of the patient were reviewed by me and considered in my medical decision making (see chart for details).    [name] is a 31 y.o. female with hx of MS who presents to ED for bilateral upper and lower extremity tingling and burning c/w her MS flares. CN 2-12 intact. No neck pain. Sensation intact. 5/5 muscle strength in all four extremities. Given solumedrol in ED. She is followed by [facility] and has left a message at their office. Encouraged to get in touch with them tomorrow. Reasons to return to ER discussed and all questions answered.   Final Clinical Impressions(s) / ED Diagnoses   Final diagnoses:  Multiple sclerosis exacerbation [facility])    ED Discharge Orders        Ordered    predniSONE (DELTASONE) 10 MG tablet     05/16/17  1708       Oshen Wlodarczyk, Ozella Almond, PA-C 05/16/17 1752    Julianne Rice, MD 05/25/17 1230

## 2017-05-16 NOTE — Discharge Instructions (Signed)
Take Prednisone as directed.  Please call your doctor in the morning to arrange follow up appointment.   Return to ER for new or worsening symptoms, any additional concerns.

## 2017-05-16 NOTE — ED Triage Notes (Signed)
Pt comes in with complaints of MS pain for the past 2 days.  Endorses the pain as "feeling on fire". Goes to [facility] for her MS. Endorses being on a 6 month injection. Ambulatory into triage.  A&O x4.

## 2017-05-16 NOTE — ED Notes (Signed)
Unsuccessful IV attempt x1. Kristen RN asked to attempt.

## 2017-05-18 ENCOUNTER — Encounter (HOSPITAL_COMMUNITY): Payer: Self-pay | Admitting: Emergency Medicine

## 2017-05-18 ENCOUNTER — Emergency Department (HOSPITAL_COMMUNITY): Payer: Medicare Other

## 2017-05-18 ENCOUNTER — Emergency Department (HOSPITAL_COMMUNITY)
Admission: EM | Admit: 2017-05-18 | Discharge: 2017-05-18 | Disposition: A | Discharge status: Home / Self Care | Payer: Medicare Other | Attending: Emergency Medicine | Admitting: Emergency Medicine

## 2017-05-18 ENCOUNTER — Other Ambulatory Visit: Payer: Self-pay

## 2017-05-18 DIAGNOSIS — Y9389 Activity, other specified: Secondary | ICD-10-CM | POA: Insufficient documentation

## 2017-05-18 DIAGNOSIS — X509XXA Other and unspecified overexertion or strenuous movements or postures, initial encounter: Secondary | ICD-10-CM | POA: Diagnosis not present

## 2017-05-18 DIAGNOSIS — S3210XA Unspecified fracture of sacrum, initial encounter for closed fracture: Secondary | ICD-10-CM

## 2017-05-18 DIAGNOSIS — S3992XA Unspecified injury of lower back, initial encounter: Secondary | ICD-10-CM | POA: Diagnosis present

## 2017-05-18 DIAGNOSIS — Y929 Unspecified place or not applicable: Secondary | ICD-10-CM | POA: Diagnosis not present

## 2017-05-18 DIAGNOSIS — Y999 Unspecified external cause status: Secondary | ICD-10-CM | POA: Insufficient documentation

## 2017-05-18 DIAGNOSIS — S322XXA Fracture of coccyx, initial encounter for closed fracture: Secondary | ICD-10-CM | POA: Insufficient documentation

## 2017-05-18 LAB — POC URINE PREG, ED: Preg Test, Ur: NEGATIVE

## 2017-05-18 MED ORDER — DEXAMETHASONE SODIUM PHOSPHATE 10 MG/ML IJ SOLN
10.0000 mg | Freq: Once | INTRAMUSCULAR | Status: AC
  Administered 2017-05-18: 10 mg via INTRAMUSCULAR
  Filled 2017-05-18: qty 1

## 2017-05-18 MED ORDER — KETOROLAC TROMETHAMINE 30 MG/ML IJ SOLN
30.0000 mg | Freq: Once | INTRAMUSCULAR | Status: AC
  Administered 2017-05-18: 30 mg via INTRAMUSCULAR
  Filled 2017-05-18: qty 1

## 2017-05-18 MED ORDER — IBUPROFEN 600 MG PO TABS: 600.0000 mg | ORAL_TABLET | Freq: Four times a day (QID) | ORAL | 0 refills | Status: DC | PRN

## 2017-05-18 MED ORDER — HYDROCODONE-ACETAMINOPHEN 5-325 MG PO TABS: 1.0000 | ORAL_TABLET | ORAL | 0 refills | Status: DC | PRN

## 2017-05-18 NOTE — ED Triage Notes (Signed)
Pt states she missed the chair when attempting to sit  in a rolling chair and landed on buttocks. C/o low back pain radiating down legs. Hx of MS.

## 2017-05-18 NOTE — ED Provider Notes (Signed)
[name] DEPT Provider Note   CSN: [id number] Arrival date & time: 05/18/17  0751     History   Chief Complaint Chief Complaint  Patient presents with  . Fall    HPI [name] is a 31 y.o. female.  Pt presents to the ED today with numbness in both legs and pain in her tailbone.  Pt said that yesterday she was trying to sit down on a rolling chair, it moved, she missed the chair, and she fell.  She was here on 12/2 for a MS flare.  After the steroid injection, her numbness in her legs improved, but now the numbness is back.  She is able to ambulate.  No problems urinating.        Past Medical History:  Diagnosis Date  . Abnormal Pap smear    Colpo>normal  . Gonorrhea   . Headache    last migraine 01/2017  . History of IBS    watch diet, no meds  . Multiple sclerosis ([place])   . Neuromuscular disorder [facility])    diagnosed with MS this visit  . Obesity   . Ovarian cyst   . Pernicious anemia 05/02/2013  . Preterm labor    all 3 deliveries  . SVD (spontaneous vaginal delivery)    x 3  . Urinary tract infection   . Vaginal Pap smear, abnormal     Patient Active Problem List   Diagnosis Date Noted  . Fibroid of cervix   . Cervical mass 12/23/2016  . Child behavior problem 03/27/2016  . Breast mass 02/09/2016  . B12 deficiency 10/04/2013  . Unspecified vitamin D deficiency 10/04/2013  . Malabsorption 08/29/2013  . 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 Insomnia 06/27/2013  . Adjustment disorder with mixed anxiety and depressed mood 03/13/2013  . Multiple sclerosis ([place]) 03/13/2013  . Abdominal pain 07/29/2007  . OBESITY, NOS 08/12/2006    Past Surgical History:  Procedure Laterality Date  . COLPOSCOPY    . MYOMECTOMY N/A 04/01/2017   Procedure: Vaginal Myomectomy;  Surgeon: Osborne Oman, MD;  Location: [facility];  Service: Gynecology;  Laterality: N/A;  . TUBAL LIGATION     2012  . WISDOM TOOTH  EXTRACTION      OB History    Gravida Para Term Preterm AB Living   6 3 1 2 3 4    SAB TAB Ectopic Multiple Live Births     3   1 2        Home Medications    Prior to Admission medications   Medication Sig Start Date End Date Taking? Authorizing Provider  docusate sodium (COLACE) 100 MG capsule Take 1 capsule (100 mg total) by mouth 2 (two) times daily as needed. 04/01/17   Anyanwu, Sallyanne Havers, MD  HYDROcodone-acetaminophen (NORCO/VICODIN) 5-325 MG tablet Take 1 tablet by mouth every 4 (four) hours as needed. 05/18/17   Isla Pence, MD  ibuprofen (ADVIL,MOTRIN) 600 MG tablet Take 1 tablet (600 mg total) by mouth every 6 (six) hours as needed. 05/18/17   Isla Pence, MD  ocrelizumab (OCREVUS) 300 MG/10ML injection Inject 600 mg into the vein every 6 (six) months.    [provider]  oxyCODONE-acetaminophen (PERCOCET/ROXICET) 5-325 MG tablet Take 1 tablet by mouth every 6 (six) hours as needed for severe pain. Patient not taking: Reported on 04/28/2017 04/01/17   Anyanwu, Sallyanne Havers, MD  predniSONE (DELTASONE) 10 MG tablet Take six tablets a day for two days, Then five  tablets a day for two days, Then four tablets a day for two days, Then three tablets a day for two days, Then two tablets a day for two days, Then one tablet a day for two days 05/16/17   Ward, Ozella Almond, PA-C    Family History Family History  Problem Relation Age of Onset  . Diabetes Mother   . Hypertension Mother   . Diabetes Father   . Hypertension Father   . Anesthesia problems Neg Hx   . Other Neg Hx     Social History Social History   Tobacco Use  . Smoking status: Never Smoker  . Smokeless tobacco: Never Used  Substance Use Topics  . Alcohol use: No    Alcohol/week: 0.0 oz    Comment:    . Drug use: No     Allergies   Patient has no known allergies.   Review of Systems Review of Systems  Musculoskeletal: Positive for back pain.  Neurological: Positive for numbness.  All  other systems reviewed and are negative.    Physical Exam Updated Vital Signs BP (!) 147/74 (BP Location: Right Arm)   Pulse 70   Temp 98.1 F (36.7 C) (Oral)   Resp 16   Ht 5\' 4"  (1.626 m)   Wt 106.6 kg (235 lb)   LMP 05/09/2017   SpO2 100%   BMI 40.34 kg/m   Physical Exam  Constitutional: She is oriented to person, place, and time. She appears well-developed and well-nourished.  HENT:  Head: Normocephalic and atraumatic.  Right Ear: External ear normal.  Left Ear: External ear normal.  Nose: Nose normal.  Mouth/Throat: Oropharynx is clear and moist.  Eyes: Conjunctivae and EOM are normal. Pupils are equal, round, and reactive to light.  Neck: Normal range of motion. Neck supple.  Cardiovascular: Normal rate, regular rhythm, normal heart sounds and intact distal pulses.  Pulmonary/Chest: Effort normal and breath sounds normal.  Abdominal: Soft. Bowel sounds are normal.  Musculoskeletal:       Lumbar back: She exhibits tenderness.  Neurological: She is alert and oriented to person, place, and time.  Skin: Skin is warm and dry. Capillary refill takes less than 2 seconds.  Psychiatric: She has a normal mood and affect. Her behavior is normal. Judgment and thought content normal.  Nursing note and vitals reviewed.    ED Treatments / Results  Labs (all labs ordered are listed, but only abnormal results are displayed) Labs Reviewed  POC URINE PREG, ED    EKG  EKG Interpretation None       Radiology Dg Lumbar Spine Complete  Result Date: 05/18/2017 CLINICAL DATA:  Pt states she missed the chair when attempting to sit in a rolling chair yesterday and landed on buttocks. Low back pain radiating down legs. EXAM: LUMBAR SPINE - COMPLETE 4+ VIEW COMPARISON:  None. FINDINGS: There are 5 nonrib bearing lumbar-type vertebral bodies. The vertebral body heights are maintained. The alignment is anatomic. There is no static listhesis. There is no spondylolysis. There is no  acute lumbar fracture. There is cortical irregularity of the S5 vertebral body concerning for a nondisplaced fracture. There is minimal degenerative disc disease at T11-12. The remainder of the disc spaces are maintained. The SI joints are unremarkable. IMPRESSION: Nondisplaced fracture of the S5 vertebral body. Electronically Signed   By: Kathreen Devoid   On: 05/18/2017 10:12   Dg Sacrum/coccyx  Result Date: 05/18/2017 CLINICAL DATA:  Patient fell when its emptying to sit in a  chair that was rolling out from under her. Persistent low back pain radiating into both legs. EXAM: SACRUM AND COCCYX - 2+ VIEW COMPARISON:  Limited views of the sacrum and coccyx from a pelvic MRI of February 01, 2017 FINDINGS: The sacrum is subjectively adequately mineralized. There are at least 4 intact sacral struts. The SI joints are grossly normal. There is an abnormal appearance of the sacrococcygeal junction with possible fracture of the proximal coccygeal segment. The presacral soft tissues are grossly normal. IMPRESSION: Probable fracture at the sacrococcygeal junction with mild anterior displacement of the coccyx. Elsewhere the sacrum is unremarkable. Electronically Signed   By: David  Martinique M.D.   On: 05/18/2017 10:08    Procedures Procedures (including critical care time)  Medications Ordered in ED Medications  dexamethasone (DECADRON) injection 10 mg (10 mg Intramuscular Given 05/18/17 0821)  ketorolac (TORADOL) 30 MG/ML injection 30 mg (30 mg Intramuscular Given 05/18/17 6144)     Initial Impression / Assessment and Plan / ED Course  I have reviewed the triage vital signs and the nursing notes.  Pertinent labs & imaging results that were available during my care of the patient were reviewed by me and considered in my medical decision making (see chart for details).     Pt is feeling better.  She is instructed to f/u if worse.  Final Clinical Impressions(s) / ED Diagnoses   Final diagnoses:  Closed  fracture of sacrum and coccyx, initial encounter [facility])    ED Discharge Orders        Ordered    HYDROcodone-acetaminophen (NORCO/VICODIN) 5-325 MG tablet  Every 4 hours PRN     05/18/17 1021    ibuprofen (ADVIL,MOTRIN) 600 MG tablet  Every 6 hours PRN     05/18/17 1021       Isla Pence, MD 05/18/17 1024

## 2017-05-31 ENCOUNTER — Ambulatory Visit (INDEPENDENT_AMBULATORY_CARE_PROVIDER_SITE_OTHER): Payer: Medicare Other | Admitting: Orthopaedic Surgery

## 2017-05-31 DIAGNOSIS — S322XXA Fracture of coccyx, initial encounter for closed fracture: Secondary | ICD-10-CM | POA: Diagnosis not present

## 2017-05-31 MED ORDER — HYDROCODONE-ACETAMINOPHEN 5-325 MG PO TABS: 1.0000 | ORAL_TABLET | Freq: Every day | ORAL | 0 refills | Status: DC | PRN

## 2017-05-31 NOTE — Progress Notes (Signed)
Office Visit Note   Patient: [name]           Date of Birth: 21-May-1986           MRN: [id number] Visit Date: 05/31/2017              Requested by: Rogue Bussing, MD [address] [place], [place] [address] PCP: Rogue Bussing, MD   Assessment & Plan: Visit Diagnoses:  1. Closed fracture of coccyx, initial encounter [facility])     Plan: Impression is minimally displaced coccyx fracture.  Discussed with patient supportive treatment and the expected recovery time.  Prescription for Norco.  Follow-up as needed.  Follow-Up Instructions: Return if symptoms worsen or fail to improve.   Orders:  No orders of the defined types were placed in this encounter.  Meds ordered this encounter  Medications  . HYDROcodone-acetaminophen (NORCO) 5-325 MG tablet    Sig: Take 1-2 tablets by mouth daily as needed.    Dispense:  30 tablet    Refill:  0      Procedures: No procedures performed   Clinical Data: No additional findings.   Subjective: Chief Complaint  Patient presents with  . Lower Back - Pain    Patient is a 31 year old female who sustained a minimally displaced coccyx fracture from a mechanical fall on 05/17/2017.  She has multiple sclerosis at baseline.  She has had to walk with a walker secondary to pain.  Denies any bowel or bladder dysfunction or numbness and tingling.  Without signal relief.  The pain does not radiate.    Review of Systems  Constitutional: Negative.   HENT: Negative.   Eyes: Negative.   Respiratory: Negative.   Cardiovascular: Negative.   Endocrine: Negative.   Musculoskeletal: Negative.   Neurological: Negative.   Hematological: Negative.   Psychiatric/Behavioral: Negative.   All other systems reviewed and are negative.    Objective: Vital Signs: LMP 05/09/2017   Physical Exam  Constitutional: She is oriented to person, place, and time. She appears well-developed and well-nourished.  HENT:  Head:  Normocephalic and atraumatic.  Eyes: EOM are normal.  Neck: Neck supple.  Pulmonary/Chest: Effort normal.  Abdominal: Soft.  Neurological: She is alert and oriented to person, place, and time.  Skin: Skin is warm. Capillary refill takes less than 2 seconds.  Psychiatric: She has a normal mood and affect. Her behavior is normal. Judgment and thought content normal.  Nursing note and vitals reviewed.   Ortho Exam Physical exam shows tenderness of the coccyx.  No other findings. Specialty Comments:  No specialty comments available.  Imaging: No results found.   PMFS History: Patient Active Problem List   Diagnosis Date Noted  . Fibroid of cervix   . Cervical mass 12/23/2016  . Child behavior problem 03/27/2016  . Breast mass 02/09/2016  . B12 deficiency 10/04/2013  . Unspecified vitamin D deficiency 10/04/2013  . Malabsorption 08/29/2013  . 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 Insomnia 06/27/2013  . Adjustment disorder with mixed anxiety and depressed mood 03/13/2013  . Multiple sclerosis ([place]) 03/13/2013  . Abdominal pain 07/29/2007  . OBESITY, NOS 08/12/2006   Past Medical History:  Diagnosis Date  . Abnormal Pap smear    Colpo>normal  . Gonorrhea   . Headache    last migraine 01/2017  . History of IBS    watch diet, no meds  . Multiple sclerosis ([place])   . Neuromuscular disorder ([place])    diagnosed  with MS this visit  . Obesity   . Ovarian cyst   . Pernicious anemia 05/02/2013  . Preterm labor    all 3 deliveries  . SVD (spontaneous vaginal delivery)    x 3  . Urinary tract infection   . Vaginal Pap smear, abnormal     Family History  Problem Relation Age of Onset  . Diabetes Mother   . Hypertension Mother   . Diabetes Father   . Hypertension Father   . Anesthesia problems Neg Hx   . Other Neg Hx     Past Surgical History:  Procedure Laterality Date  . COLPOSCOPY    . MYOMECTOMY N/A 04/01/2017   Procedure: Vaginal Myomectomy;   Surgeon: Osborne Oman, MD;  Location: [facility];  Service: Gynecology;  Laterality: N/A;  . TUBAL LIGATION     2012  . WISDOM TOOTH EXTRACTION     Social History   Occupational History    Employer: [employer]    Comment: [employer]  Tobacco Use  . Smoking status: Never Smoker  . Smokeless tobacco: Never Used  Substance and Sexual Activity  . Alcohol use: No    Alcohol/week: 0.0 oz    Comment:    . Drug use: No  . Sexual activity: Yes    Partners: Male    Birth control/protection: Surgical

## 2017-10-04 ENCOUNTER — Encounter (INDEPENDENT_AMBULATORY_CARE_PROVIDER_SITE_OTHER): Payer: Self-pay | Admitting: Physician Assistant

## 2017-10-04 ENCOUNTER — Ambulatory Visit (INDEPENDENT_AMBULATORY_CARE_PROVIDER_SITE_OTHER): Payer: Medicare Other | Admitting: Physician Assistant

## 2017-10-04 ENCOUNTER — Ambulatory Visit (INDEPENDENT_AMBULATORY_CARE_PROVIDER_SITE_OTHER): Payer: Medicare Other

## 2017-10-04 DIAGNOSIS — M25562 Pain in left knee: Secondary | ICD-10-CM | POA: Diagnosis not present

## 2017-10-04 NOTE — Progress Notes (Signed)
Office Visit Note   Patient: [name]           Date of Birth: 02/09/1986           MRN: [id number] Visit Date: 10/04/2017              Requested by: Rogue Bussing, MD [address] [place], [place] [address] PCP: Rogue Bussing, MD   Assessment & Plan: Visit Diagnoses:  1. Acute pain of left knee     Plan: Impression is left knee patella stress fracture versus exostosis from the injury.  Due to her underlying multiple sclerosis, we will go ahead and obtain an MRI of the left knee to assess this.  She will follow-up with [place] once this is completed.  In the meantime, I have given her samples of Pennsaid as well as called in a prescription of Voltaren gel.  She will call with concerns or questions in the meantime.  Follow-Up Instructions: Return in about 2 weeks (around 10/18/2017).   Orders:  Orders Placed This Encounter  Procedures  . XR Knee Complete 4 Views Left  . MR Knee Left w/o contrast   No orders of the defined types were placed in this encounter.     Procedures: No procedures performed   Clinical Data: No additional findings.   Subjective: Chief Complaint  Patient presents with  . Left Knee - Pain    HPI patient is a pleasant 32 year old female presents to our clinic today with an injury to her left knee.  She states that approximately 2 months ago she fell after tripping over a curb.  She landed on the anterior aspect of her left knee.  Since then she has had marked pain that has somewhat improved.  All the pain is over the patella.  She describes this as a constant ache with occasional sharp shooting pains which do occur with kneeling on the knee or walking.  She has been taking ibuprofen with no relief of symptoms.  She does have multiple sclerosis and has a history of bilateral lower extremity numbness.  She was seen by her neurologist where she was told that any injury to either lower extremity could acutely exacerbate her multiple  sclerosis.  No previous injury to the left knee.  Review of Systems as detailed in HPI.  All others are negative.   Objective: Vital Signs: There were no vitals taken for this visit.  Physical Exam well-developed well-nourished female no acute distress.  Alert and oriented x3.  Ortho Exam examination of her left knee shows no effusion.  Range of motion 0 to 120 degrees.  Marked tenderness over the patella.  She does have a bony prominence where she is exquisitely tender.  Minimal medial joint line tenderness.  Negative anterior drawer.  She is stable valgus varus stress.  No focal weakness.  She is neurovascularly intact distally.  Specialty Comments:  No specialty comments available.  Imaging: Xr Knee Complete 4 Views Left  Result Date: 10/04/2017 X-rays are negative for structural abnormality    PMFS History: Patient Active Problem List   Diagnosis Date Noted  . Acute pain of left knee 10/04/2017  . Fibroid of cervix   . Cervical mass 12/23/2016  . Child behavior problem 03/27/2016  . Breast mass 02/09/2016  . B12 deficiency 10/04/2013  . Unspecified vitamin D deficiency 10/04/2013  . Malabsorption 08/29/2013  . 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 Insomnia 06/27/2013  . Adjustment disorder with  mixed anxiety and depressed mood 03/13/2013  . Multiple sclerosis ([place]) 03/13/2013  . Abdominal pain 07/29/2007  . OBESITY, NOS 08/12/2006   Past Medical History:  Diagnosis Date  . Abnormal Pap smear    Colpo>normal  . Gonorrhea   . Headache    last migraine 01/2017  . History of IBS    watch diet, no meds  . Multiple sclerosis ([place])   . Neuromuscular disorder [facility])    diagnosed with MS this visit  . Obesity   . Ovarian cyst   . Pernicious anemia 05/02/2013  . Preterm labor    all 3 deliveries  . SVD (spontaneous vaginal delivery)    x 3  . Urinary tract infection   . Vaginal Pap smear, abnormal     Family History  Problem Relation Age of  Onset  . Diabetes Mother   . Hypertension Mother   . Diabetes Father   . Hypertension Father   . Anesthesia problems Neg Hx   . Other Neg Hx     Past Surgical History:  Procedure Laterality Date  . COLPOSCOPY    . MYOMECTOMY N/A 04/01/2017   Procedure: Vaginal Myomectomy;  Surgeon: Osborne Oman, MD;  Location: [facility];  Service: Gynecology;  Laterality: N/A;  . TUBAL LIGATION     2012  . WISDOM TOOTH EXTRACTION     Social History   Occupational History    Employer: [employer]    Comment: [employer]  Tobacco Use  . Smoking status: Never Smoker  . Smokeless tobacco: Never Used  Substance and Sexual Activity  . Alcohol use: No    Alcohol/week: 0.0 oz    Comment:    . Drug use: No  . Sexual activity: Yes    Partners: Male    Birth control/protection: Surgical

## 2017-10-05 ENCOUNTER — Ambulatory Visit (INDEPENDENT_AMBULATORY_CARE_PROVIDER_SITE_OTHER): Payer: Medicare Other | Admitting: Internal Medicine

## 2017-10-05 ENCOUNTER — Encounter: Payer: Self-pay | Admitting: Internal Medicine

## 2017-10-05 ENCOUNTER — Other Ambulatory Visit: Payer: Self-pay

## 2017-10-05 VITALS — BP 120/70 | HR 72 | Temp 98.6°F | Ht 64.0 in | Wt 240.4 lb

## 2017-10-05 DIAGNOSIS — D510 Vitamin B12 deficiency anemia due to intrinsic factor deficiency: Secondary | ICD-10-CM

## 2017-10-05 DIAGNOSIS — E538 Deficiency of other specified B group vitamins: Secondary | ICD-10-CM

## 2017-10-05 MED ORDER — CYANOCOBALAMIN 1000 MCG/ML IJ SOLN: 1000.0000 ug | INTRAMUSCULAR | 0 refills | Status: DC

## 2017-10-05 MED ORDER — SYRINGE (DISPOSABLE) 1 ML MISC: 1.0000 | 0 refills | Status: DC

## 2017-10-07 ENCOUNTER — Encounter: Payer: Self-pay | Admitting: Internal Medicine

## 2017-10-07 NOTE — Patient Instructions (Signed)
Return in 2 weeks for repeat labs.

## 2017-10-07 NOTE — Assessment & Plan Note (Signed)
-   Patient with recent low level and presumably symptomatic with lower extremity sensation of coldness, warranting treatment. Also reports fatigue and GI upset. 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 - Ordered B12 injections, 1000 mcg every other day x 2 weeks with plan for lab recheck in 2 weeks--if improved will switch to once monthly dosing. Will also check folate at that time.  - Will refer back to GI for recommendations on monitoring, as at increased risk for gastric cancer, and possible further work-up for malabsorptive disease

## 2017-10-07 NOTE — Progress Notes (Signed)
[facility] Progress Note  Subjective:  [name] is a 32 y.o. female with history of multiple sclerosis, B12 deficiency, and obesity who presents for discussion of B12 deficiency. She was recently seen by her Neurologist, as she was concerned she might be having an MS flare due to cold sensation of her lower legs and arms. Recent brain scan stable but was found to have B12 level of 123 pg/ml. Patient has been on B12 shots in the past after trying PO supplementation without much improvement. She reports she has seen GI in the past but did not have much of a work-up. Records show she had EGD and duodenal biopsy done at [facility] in 2015, not showing celiac disease or gastritis that could cause pernicious anemia. She continues to have abdominal pains. She has not been taking protonix. She would like to resume B12 shots. ROS: No falls, no numbness of extremities or vision changes  No Known Allergies  Social History   Tobacco Use  . Smoking status: Never Smoker  . Smokeless tobacco: Never Used  Substance Use Topics  . Alcohol use: No    Alcohol/week: 0.0 oz    Comment:      Objective: Blood pressure 120/70, pulse 72, temperature 98.6 F (37 C), temperature source Oral, height 5\' 4"  (1.626 m), weight 240 lb 6.4 oz (109 kg), last menstrual period 09/13/2017, SpO2 98 %. Body mass index is 41.26 kg/m. Constitutional: Well-appearing female in NAD Abdominal: Soft. +BS, NT Neurological: Upper and lower extremity strength 5/5 bilaterally.  Skin: Skin is warm and dry. No rash noted.  Psychiatric: Normal mood and affect.  Vitals reviewed  Assessment/Plan: B12 deficiency - Patient with recent low level and presumably symptomatic with lower extremity sensation of coldness, warranting treatment. Also reports fatigue and GI upset. 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 - Ordered B12 injections, 1000 mcg every other day x 2 weeks with plan for lab recheck in 2  weeks--if improved will switch to once monthly dosing. Will also check folate at that time.  - Will refer back to GI for recommendations on monitoring, as at increased risk for gastric cancer, and possible further work-up for malabsorptive disease  Follow-up in 2 weeks for lab check.  Olene Floss, MD [place], PGY-3

## 2017-10-12 ENCOUNTER — Other Ambulatory Visit: Payer: Medicare Other

## 2017-10-22 ENCOUNTER — Other Ambulatory Visit: Payer: Medicare Other

## 2017-10-22 DIAGNOSIS — E538 Deficiency of other specified B group vitamins: Secondary | ICD-10-CM

## 2017-10-23 LAB — VITAMIN B12: VITAMIN B 12: 735 pg/mL (ref 232–1245)

## 2017-10-23 LAB — FOLATE: Folate: 5.1 ng/mL (ref >3.0)

## 2017-10-25 ENCOUNTER — Telehealth: Payer: Self-pay | Admitting: Internal Medicine

## 2017-10-25 DIAGNOSIS — E538 Deficiency of other specified B group vitamins: Secondary | ICD-10-CM

## 2017-10-25 MED ORDER — CYANOCOBALAMIN 1000 MCG/ML IJ SOLN: 1000.0000 ug | INTRAMUSCULAR | 0 refills | Status: DC

## 2017-10-25 NOTE — Telephone Encounter (Signed)
Discussed lab results with patient. B12 is now in normal range. Patient, however, notices return of leg pain since stopping every other day dosing. Recommended continuing treatment with once monthly injections x 3 months then return for labs. To have GI referral in June given concern for malabsorptive process.  Olene Floss, MD [place], PGY-3

## 2017-11-24 ENCOUNTER — Inpatient Hospital Stay (HOSPITAL_COMMUNITY): Payer: Medicare Other

## 2017-11-24 ENCOUNTER — Inpatient Hospital Stay (HOSPITAL_COMMUNITY)
Admission: AD | Admit: 2017-11-24 | Discharge: 2017-11-24 | Disposition: A | Discharge status: Home Health | Payer: Medicare Other | Source: Ambulatory Visit | Attending: Obstetrics and Gynecology | Admitting: Obstetrics and Gynecology

## 2017-11-24 ENCOUNTER — Encounter (HOSPITAL_COMMUNITY): Payer: Self-pay

## 2017-11-24 DIAGNOSIS — R1010 Upper abdominal pain, unspecified: Secondary | ICD-10-CM | POA: Diagnosis present

## 2017-11-24 DIAGNOSIS — K8020 Calculus of gallbladder without cholecystitis without obstruction: Secondary | ICD-10-CM | POA: Diagnosis not present

## 2017-11-24 DIAGNOSIS — R109 Unspecified abdominal pain: Secondary | ICD-10-CM

## 2017-11-24 DIAGNOSIS — K810 Acute cholecystitis: Secondary | ICD-10-CM | POA: Diagnosis not present

## 2017-11-24 DIAGNOSIS — K8000 Calculus of gallbladder with acute cholecystitis without obstruction: Secondary | ICD-10-CM | POA: Insufficient documentation

## 2017-11-24 DIAGNOSIS — R1011 Right upper quadrant pain: Secondary | ICD-10-CM

## 2017-11-24 LAB — COMPREHENSIVE METABOLIC PANEL
ALK PHOS: 39 U/L (ref 38–126)
ALT: 11 U/L — AB (ref 14–54)
ANION GAP: 10 (ref 5–15)
AST: 16 U/L (ref 15–41)
Albumin: 4 g/dL (ref 3.5–5.0)
BILIRUBIN TOTAL: 0.8 mg/dL (ref 0.3–1.2)
BUN: 9 mg/dL (ref 6–20)
CALCIUM: 9.2 mg/dL (ref 8.9–10.3)
CO2: 25 mmol/L (ref 22–32)
CREATININE: 0.92 mg/dL (ref 0.44–1.00)
Chloride: 102 mmol/L (ref 101–111)
GFR calc Af Amer: >60 mL/min (ref >60)
Glucose, Bld: 128 mg/dL — ABNORMAL HIGH (ref 65–99)
Potassium: 3.5 mmol/L (ref 3.5–5.1)
SODIUM: 137 mmol/L (ref 135–145)
TOTAL PROTEIN: 7.2 g/dL (ref 6.5–8.1)

## 2017-11-24 LAB — URINALYSIS, ROUTINE W REFLEX MICROSCOPIC
Bacteria, UA: NONE SEEN
Bilirubin Urine: NEGATIVE
Glucose, UA: NEGATIVE mg/dL
Ketones, ur: NEGATIVE mg/dL
Leukocytes, UA: NEGATIVE
Nitrite: NEGATIVE
Protein, ur: NEGATIVE mg/dL
Specific Gravity, Urine: 1.019 (ref 1.005–1.030)
pH: 9 — ABNORMAL HIGH (ref 5.0–8.0)

## 2017-11-24 LAB — AMYLASE: Amylase: 106 U/L — ABNORMAL HIGH (ref 28–100)

## 2017-11-24 LAB — LIPASE, BLOOD: LIPASE: 40 U/L (ref 11–51)

## 2017-11-24 LAB — CBC
HEMATOCRIT: 34.3 % — AB (ref 36.0–46.0)
Hemoglobin: 10.9 g/dL — ABNORMAL LOW (ref 12.0–15.0)
MCH: 29.5 pg (ref 26.0–34.0)
MCHC: 31.8 g/dL (ref 30.0–36.0)
MCV: 93 fL (ref 78.0–100.0)
Platelets: 430 10*3/uL — ABNORMAL HIGH (ref 150–400)
RBC: 3.69 MIL/uL — AB (ref 3.87–5.11)
RDW: 12.6 % (ref 11.5–15.5)
WBC: 9 10*3/uL (ref 4.0–10.5)

## 2017-11-24 MED ORDER — HYDROMORPHONE HCL 1 MG/ML IJ SOLN: 1.0000 mg | Freq: Once | INTRAMUSCULAR | Status: DC

## 2017-11-24 MED ORDER — HYDROMORPHONE HCL 2 MG PO TABS: 1.0000 mg | ORAL_TABLET | Freq: Four times a day (QID) | ORAL | 0 refills | Status: DC | PRN

## 2017-11-24 MED ORDER — NALBUPHINE HCL 10 MG/ML IJ SOLN
10.0000 mg | Freq: Once | INTRAMUSCULAR | Status: DC
  Filled 2017-11-24: qty 1

## 2017-11-24 MED ORDER — HYDROMORPHONE HCL 1 MG/ML IJ SOLN
1.0000 mg | INTRAMUSCULAR | Status: DC | PRN
  Administered 2017-11-24: 1 mg via INTRAMUSCULAR
  Filled 2017-11-24 (×2): qty 1

## 2017-11-24 MED ORDER — NALBUPHINE HCL 10 MG/ML IJ SOLN
10.0000 mg | Freq: Once | INTRAMUSCULAR | Status: AC
  Administered 2017-11-24: 10 mg via INTRAVENOUS

## 2017-11-24 MED ORDER — GI COCKTAIL ~~LOC~~
30.0000 mL | Freq: Once | ORAL | Status: AC
  Administered 2017-11-24: 30 mL via ORAL
  Filled 2017-11-24: qty 30

## 2017-11-24 MED ORDER — PROMETHAZINE HCL 25 MG/ML IJ SOLN: 25.0000 mg | Freq: Once | INTRAMUSCULAR | Status: DC

## 2017-11-24 MED ORDER — SODIUM CHLORIDE 0.9 % IV SOLN
INTRAVENOUS | Status: DC
  Administered 2017-11-24: 22:00:00 via INTRAVENOUS

## 2017-11-24 MED ORDER — PROMETHAZINE HCL 25 MG/ML IJ SOLN
12.5000 mg | Freq: Four times a day (QID) | INTRAMUSCULAR | Status: DC | PRN
  Administered 2017-11-24: 12.5 mg via INTRAVENOUS
  Filled 2017-11-24: qty 1

## 2017-11-24 MED ORDER — HYDROMORPHONE HCL 1 MG/ML IJ SOLN: 1.0000 mg | INTRAMUSCULAR | Status: DC | PRN

## 2017-11-24 MED ORDER — PROMETHAZINE HCL 12.5 MG PO TABS: 12.5000 mg | ORAL_TABLET | Freq: Four times a day (QID) | ORAL | 0 refills | Status: DC | PRN

## 2017-11-24 NOTE — MAU Note (Signed)
Pt here with c/o severe epigastric pain that wraps around to her back on the left side. Hurting for about 3 hours now.

## 2017-11-24 NOTE — Progress Notes (Addendum)
Assumed care of pt from [employer].   Provider ordered for labs, GI cocktail, and U/S   2104: lab in unit and made aware of 10 needing labs.  2106: Lab at bs  2108: Medicated per order.   2110 Notified/paged U/s  2115 In U/S  2132: back from u/s  Medicated per order.   2308: provider at bs reassessing and D/c instructions given with pt understanding. Pt left unit via ambulatory. Being picked up by family. Informed pt not to eat past midnight due to possible surgery tomorrow. Pt verbalizes understanding.

## 2017-11-24 NOTE — MAU Provider Note (Addendum)
Patient [name] is a 32 y.o.  [phone] non-pregnant female here with complaints of sudden onset of upper abdominal pain. She also has endorsed some green stools yesterday. She denies dysuria, low back pain, lower abdominal pain, chest pain or difficulty breathing.  History     CSN: [id number]  Arrival date and time: 11/24/17 1949   First Provider Initiated Contact with Patient 11/24/17 2053      Chief Complaint  Patient presents with  . Abdominal Pain   Abdominal Pain  This is a new problem. The current episode started today. The problem occurs constantly. The problem has been unchanged. The pain is located in the LUQ and epigastric region. The pain is at a severity of 9/10. The quality of the pain is burning, sharp and tearing. The abdominal pain radiates to the back. Associated symptoms include diarrhea. Exacerbated by: hurts more when she leans back.   Patient states that she started having excruciating pain at 5 pm after eating rice. It continued to get worse and worse until she decided to come here.   Patient states that she has always had a feeling of bloating and fullness; she has seen a GI doctor in the past and was diagnosed with IBS. She has a GI appt next month with a gastroenterologist, which patient states is to "make sure I don't have stomach cancer".    OB History    Gravida  6   Para  3   Term  1   Preterm  2   AB  3   Living  4     SAB      TAB  3   Ectopic      Multiple  1   Live Births  2           Past Medical History:  Diagnosis Date  . Abnormal Pap smear    Colpo>normal  . Gonorrhea   . Headache    last migraine 01/2017  . History of IBS    watch diet, no meds  . Multiple sclerosis ([place])   . Neuromuscular disorder [facility])    diagnosed with MS this visit  . Obesity   . Ovarian cyst   . Pernicious anemia 05/02/2013  . Preterm labor    all 3 deliveries  . SVD (spontaneous vaginal delivery)    x 3  . Urinary tract infection    . Vaginal Pap smear, abnormal     Past Surgical History:  Procedure Laterality Date  . COLPOSCOPY    . MYOMECTOMY N/A 04/01/2017   Procedure: Vaginal Myomectomy;  Surgeon: Osborne Oman, MD;  Location: [facility];  Service: Gynecology;  Laterality: N/A;  . TUBAL LIGATION     2012  . WISDOM TOOTH EXTRACTION      Family History  Problem Relation Age of Onset  . Diabetes Mother   . Hypertension Mother   . Diabetes Father   . Hypertension Father   . Anesthesia problems Neg Hx   . Other Neg Hx     Social History   Tobacco Use  . Smoking status: Never Smoker  . Smokeless tobacco: Never Used  Substance Use Topics  . Alcohol use: No    Alcohol/week: 0.0 oz    Comment:    . Drug use: No    Allergies: No Known Allergies  Medications Prior to Admission  Medication Sig Dispense Refill Last Dose  . cyanocobalamin (,VITAMIN B-12,) 1000 MCG/ML injection Inject 1 mL (1,000 mcg total)  into the muscle every 30 (thirty) days. Continue for 3 months, then stop and return for labs. 3 mL 0   . docusate sodium (COLACE) 100 MG capsule Take 1 capsule (100 mg total) by mouth 2 (two) times daily as needed. 30 capsule 2 Taking  . HYDROcodone-acetaminophen (NORCO) 5-325 MG tablet Take 1-2 tablets by mouth daily as needed. 30 tablet 0   . HYDROcodone-acetaminophen (NORCO/VICODIN) 5-325 MG tablet Take 1 tablet by mouth every 4 (four) hours as needed. (Patient not taking: Reported on 05/31/2017) 10 tablet 0 Not Taking  . ibuprofen (ADVIL,MOTRIN) 600 MG tablet Take 1 tablet (600 mg total) by mouth every 6 (six) hours as needed. 30 tablet 0 Taking  . ocrelizumab (OCREVUS) 300 MG/10ML injection Inject 600 mg into the vein every 6 (six) months.   Taking  . oxyCODONE-acetaminophen (PERCOCET/ROXICET) 5-325 MG tablet Take 1 tablet by mouth every 6 (six) hours as needed for severe pain. (Patient not taking: Reported on 04/28/2017) 20 tablet 0 Not Taking  . predniSONE (DELTASONE) 10 MG tablet Take six tablets  a day for two days, Then five tablets a day for two days, Then four tablets a day for two days, Then three tablets a day for two days, Then two tablets a day for two days, Then one tablet a day for two days (Patient not taking: Reported on 05/31/2017) 42 tablet 0 Not Taking  . Syringe, Disposable, 1 ML MISC 1 Device by Does not apply route every other day. 25 each 0     Review of Systems  Constitutional: Negative.   HENT: Negative.   Eyes: Negative.   Gastrointestinal: Positive for abdominal pain and diarrhea.  Endocrine: Negative.   Genitourinary: Negative for vaginal bleeding, vaginal discharge and vaginal pain.  Musculoskeletal: Negative.   Neurological: Negative.   Psychiatric/Behavioral: Negative.    Physical Exam   Blood pressure (!) 150/93, pulse 91, temperature 98.5 F (36.9 C), temperature source Oral, resp. rate 20, height 5\' 4"  (1.626 m), weight 238 lb (108 kg), last menstrual period 11/06/2017, SpO2 100 %.  Physical Exam  Constitutional: She is oriented to person, place, and time. She appears well-developed and well-nourished.  HENT:  Head: Normocephalic.  Neck: Normal range of motion.  Respiratory: Effort normal.  GI: Soft.  Musculoskeletal: Normal range of motion.  Neurological: She is alert and oriented to person, place, and time.  Skin: Skin is warm and dry.  Psychiatric: She has a normal mood and affect.    MAU Course  Procedures  MDM RUQ [place] pending CBC and CMP, amylase and lipase.  1 mg of Dilaudid IM and GI cocktail; pain is now a 6/10.   2150 patient still feels nauseated; will start IV and give phenergan and dilaudid again.   Consider discharge home with Dilaudid for 5 days and/or augmentin if WBC count over 12 and/or fever.  -Follow up with [facility] in the morning.   Patient care endorsed to [facility] at 2208.   Mervyn Skeeters Kooistra 11/24/2017, 9:12 PM   [place] results reviewed: US Abdomen Limited Ruq  Result Date:  11/24/2017 CLINICAL DATA:  Epigastric pain EXAM: ULTRASOUND ABDOMEN LIMITED RIGHT UPPER QUADRANT COMPARISON:  None. FINDINGS: Gallbladder: Multiple stones are identified within the gallbladder measuring up to 1.2 cm. Negative sonographic Murphy's sign. Areas of mild gallbladder wall thickening measuring up to 4 mm noted. There is a small amount of pericholecystic fluid. Common bile duct: Diameter: 6 mm. Liver: No focal lesion identified. Within normal limits in  parenchymal echogenicity. Portal vein is patent on color Doppler imaging with normal direction of blood flow towards the liver. IMPRESSION: 1. Gallstone, pericholecystic fluid and mild gallbladder wall thickening. Suspicious for acute cholecystitis. If further imaging is clinically indicated consider nuclear medicine hepatic biliary to assess patency of the cystic duct. Electronically Signed   By: Kerby Moors M.D.   On: 11/24/2017 21:57   IV fluid bolus with phenergan and nubain given to patient prior to discharge home. Patient reports decreased abdominal pain from 9/10 to 4/10.   Discussed results with patient. Educated on gallstones and acute cholecystitis. Discussed POC with patient with discharge home tonight and f/u tomorrow at [facility] for cholecystitis. Pt verbalizes understanding. Okay to discharge home. Pt stable at time of discharge- afebrile.   Assessment and Plan   1. Gallstones   2. Abdominal pain   3. Acute cholecystitis    Discharge home  F/u tomorrow with [facility] for management of acute cholecystitis  Return to [facility] or [facility] ED for worsening abdominal pain and nausea/vomiting  Rx for Phenergan and Dilaudid sent to pharmacy of choice  Pt stable prior to discharge   Allergies as of 11/24/2017   No Known Allergies     Medication List    STOP taking these medications   HYDROcodone-acetaminophen 5-325 MG tablet Commonly known as:  NORCO   oxyCODONE-acetaminophen 5-325 MG tablet Commonly  known as:  PERCOCET/ROXICET   predniSONE 10 MG tablet Commonly known as:  DELTASONE     TAKE these medications   cyanocobalamin 1000 MCG/ML injection Commonly known as:  (VITAMIN B-12) Inject 1 mL (1,000 mcg total) into the muscle every 30 (thirty) days. Continue for 3 months, then stop and return for labs.   docusate sodium 100 MG capsule Commonly known as:  COLACE Take 1 capsule (100 mg total) by mouth 2 (two) times daily as needed.   HYDROmorphone 2 MG tablet Commonly known as:  DILAUDID Take 0.5 tablets (1 mg total) by mouth every 6 (six) hours as needed for up to 3 days for severe pain.   ibuprofen 600 MG tablet Commonly known as:  ADVIL,MOTRIN Take 1 tablet (600 mg total) by mouth every 6 (six) hours as needed.   OCREVUS 300 MG/10ML injection Generic drug:  ocrelizumab Inject 600 mg into the vein every 6 (six) months.   promethazine 12.5 MG tablet Commonly known as:  PHENERGAN Take 1 tablet (12.5 mg total) by mouth every 6 (six) hours as needed for nausea or vomiting.   Syringe (Disposable) 1 ML Misc 1 Device by Does not apply route every other day.      Lajean Manes, CNM 11/25/17, 12:23 AM

## 2017-11-25 ENCOUNTER — Other Ambulatory Visit: Payer: Self-pay | Admitting: General Surgery

## 2017-11-25 ENCOUNTER — Observation Stay (HOSPITAL_COMMUNITY): Payer: Medicare Other | Admitting: Certified Registered Nurse Anesthetist

## 2017-11-25 ENCOUNTER — Other Ambulatory Visit: Payer: Self-pay

## 2017-11-25 ENCOUNTER — Observation Stay (HOSPITAL_COMMUNITY): Payer: Medicare Other

## 2017-11-25 ENCOUNTER — Encounter (HOSPITAL_COMMUNITY): Admission: AD | Disposition: A | Discharge status: Home / Self Care | Payer: Self-pay | Source: Ambulatory Visit

## 2017-11-25 ENCOUNTER — Observation Stay (HOSPITAL_COMMUNITY)
Admission: AD | Admit: 2017-11-25 | Discharge: 2017-11-26 | Disposition: A | Discharge status: Home / Self Care | Payer: Medicare Other | Source: Ambulatory Visit | Attending: Surgery | Admitting: Surgery

## 2017-11-25 ENCOUNTER — Encounter (HOSPITAL_COMMUNITY): Payer: Self-pay

## 2017-11-25 DIAGNOSIS — K219 Gastro-esophageal reflux disease without esophagitis: Secondary | ICD-10-CM | POA: Insufficient documentation

## 2017-11-25 DIAGNOSIS — Z6841 Body Mass Index (BMI) 40.0 and over, adult: Secondary | ICD-10-CM | POA: Insufficient documentation

## 2017-11-25 DIAGNOSIS — E538 Deficiency of other specified B group vitamins: Secondary | ICD-10-CM | POA: Insufficient documentation

## 2017-11-25 DIAGNOSIS — Z79899 Other long term (current) drug therapy: Secondary | ICD-10-CM | POA: Insufficient documentation

## 2017-11-25 DIAGNOSIS — G35 Multiple sclerosis: Secondary | ICD-10-CM | POA: Diagnosis not present

## 2017-11-25 DIAGNOSIS — K8000 Calculus of gallbladder with acute cholecystitis without obstruction: Secondary | ICD-10-CM | POA: Diagnosis present

## 2017-11-25 DIAGNOSIS — K810 Acute cholecystitis: Secondary | ICD-10-CM

## 2017-11-25 DIAGNOSIS — R51 Headache: Secondary | ICD-10-CM | POA: Insufficient documentation

## 2017-11-25 DIAGNOSIS — E559 Vitamin D deficiency, unspecified: Secondary | ICD-10-CM | POA: Diagnosis not present

## 2017-11-25 DIAGNOSIS — N63 Unspecified lump in unspecified breast: Secondary | ICD-10-CM | POA: Diagnosis not present

## 2017-11-25 DIAGNOSIS — K8010 Calculus of gallbladder with chronic cholecystitis without obstruction: Secondary | ICD-10-CM | POA: Diagnosis present

## 2017-11-25 DIAGNOSIS — G4700 Insomnia, unspecified: Secondary | ICD-10-CM | POA: Diagnosis not present

## 2017-11-25 DIAGNOSIS — K828 Other specified diseases of gallbladder: Secondary | ICD-10-CM | POA: Insufficient documentation

## 2017-11-25 DIAGNOSIS — K909 Intestinal malabsorption, unspecified: Secondary | ICD-10-CM | POA: Insufficient documentation

## 2017-11-25 HISTORY — PX: CHOLECYSTECTOMY: SHX55

## 2017-11-25 LAB — CBC WITH DIFFERENTIAL/PLATELET
BASOS PCT: 0 %
Basophils Absolute: 0 10*3/uL (ref 0.0–0.1)
Eosinophils Absolute: 0 10*3/uL (ref 0.0–0.7)
Eosinophils Relative: 1 %
HEMATOCRIT: 34 % — AB (ref 36.0–46.0)
HEMOGLOBIN: 10.6 g/dL — AB (ref 12.0–15.0)
Lymphocytes Relative: 25 %
Lymphs Abs: 1.6 10*3/uL (ref 0.7–4.0)
MCH: 29.8 pg (ref 26.0–34.0)
MCHC: 31.2 g/dL (ref 30.0–36.0)
MCV: 95.5 fL (ref 78.0–100.0)
MONOS PCT: 12 %
Monocytes Absolute: 0.7 10*3/uL (ref 0.1–1.0)
NEUTROS ABS: 4 10*3/uL (ref 1.7–7.7)
NEUTROS PCT: 62 %
Platelets: 411 10*3/uL — ABNORMAL HIGH (ref 150–400)
RBC: 3.56 MIL/uL — ABNORMAL LOW (ref 3.87–5.11)
RDW: 12.5 % (ref 11.5–15.5)
WBC: 6.4 10*3/uL (ref 4.0–10.5)

## 2017-11-25 LAB — COMPREHENSIVE METABOLIC PANEL
ALBUMIN: 3.7 g/dL (ref 3.5–5.0)
ALT: 11 U/L — ABNORMAL LOW (ref 14–54)
ANION GAP: 7 (ref 5–15)
AST: 18 U/L (ref 15–41)
Alkaline Phosphatase: 40 U/L (ref 38–126)
BILIRUBIN TOTAL: 0.5 mg/dL (ref 0.3–1.2)
BUN: 9 mg/dL (ref 6–20)
CALCIUM: 8.8 mg/dL — AB (ref 8.9–10.3)
CO2: 28 mmol/L (ref 22–32)
Chloride: 105 mmol/L (ref 101–111)
Creatinine, Ser: 0.97 mg/dL (ref 0.44–1.00)
GFR calc Af Amer: >60 mL/min (ref >60)
GFR calc non Af Amer: >60 mL/min (ref >60)
GLUCOSE: 90 mg/dL (ref 65–99)
Potassium: 4.4 mmol/L (ref 3.5–5.1)
SODIUM: 140 mmol/L (ref 135–145)
TOTAL PROTEIN: 6.8 g/dL (ref 6.5–8.1)

## 2017-11-25 LAB — MRSA PCR SCREENING: MRSA by PCR: NEGATIVE

## 2017-11-25 LAB — POCT PREGNANCY, URINE: Preg Test, Ur: NEGATIVE

## 2017-11-25 SURGERY — LAPAROSCOPIC CHOLECYSTECTOMY WITH INTRAOPERATIVE CHOLANGIOGRAM
Anesthesia: General | Site: Abdomen

## 2017-11-25 MED ORDER — DEXAMETHASONE SODIUM PHOSPHATE 10 MG/ML IJ SOLN
INTRAMUSCULAR | Status: DC | PRN
  Administered 2017-11-25: 10 mg via INTRAVENOUS

## 2017-11-25 MED ORDER — BUPIVACAINE-EPINEPHRINE (PF) 0.5% -1:200000 IJ SOLN
INTRAMUSCULAR | Status: AC
  Filled 2017-11-25: qty 30

## 2017-11-25 MED ORDER — HYDROCODONE-ACETAMINOPHEN 5-325 MG PO TABS
1.0000 | ORAL_TABLET | ORAL | Status: DC | PRN
  Administered 2017-11-25: 1 via ORAL
  Administered 2017-11-26: 2 via ORAL
  Filled 2017-11-25: qty 1
  Filled 2017-11-25: qty 2

## 2017-11-25 MED ORDER — FENTANYL CITRATE (PF) 100 MCG/2ML IJ SOLN
INTRAMUSCULAR | Status: AC
  Filled 2017-11-25: qty 2

## 2017-11-25 MED ORDER — PROPOFOL 10 MG/ML IV BOLUS
INTRAVENOUS | Status: DC | PRN
  Administered 2017-11-25: 170 mg via INTRAVENOUS

## 2017-11-25 MED ORDER — ONDANSETRON HCL 4 MG/2ML IJ SOLN
INTRAMUSCULAR | Status: AC
  Filled 2017-11-25: qty 2

## 2017-11-25 MED ORDER — ONDANSETRON HCL 4 MG/2ML IJ SOLN: 4.0000 mg | Freq: Four times a day (QID) | INTRAMUSCULAR | Status: DC | PRN

## 2017-11-25 MED ORDER — ONDANSETRON HCL 4 MG/2ML IJ SOLN
INTRAMUSCULAR | Status: DC | PRN
  Administered 2017-11-25: 4 mg via INTRAVENOUS

## 2017-11-25 MED ORDER — HYDROMORPHONE HCL 1 MG/ML IJ SOLN
1.0000 mg | INTRAMUSCULAR | Status: DC | PRN
  Administered 2017-11-25 – 2017-11-26 (×4): 1 mg via INTRAVENOUS
  Filled 2017-11-25 (×4): qty 1

## 2017-11-25 MED ORDER — DIPHENHYDRAMINE HCL 12.5 MG/5ML PO ELIX
12.5000 mg | ORAL_SOLUTION | Freq: Four times a day (QID) | ORAL | Status: DC | PRN
Start: 2017-11-25 — End: 2017-11-25

## 2017-11-25 MED ORDER — FENTANYL CITRATE (PF) 100 MCG/2ML IJ SOLN
25.0000 ug | INTRAMUSCULAR | Status: DC | PRN
  Administered 2017-11-25 (×3): 50 ug via INTRAVENOUS

## 2017-11-25 MED ORDER — ONDANSETRON HCL 4 MG/2ML IJ SOLN: 4.0000 mg | Freq: Once | INTRAMUSCULAR | Status: DC | PRN

## 2017-11-25 MED ORDER — ONDANSETRON 4 MG PO TBDP: 4.0000 mg | ORAL_TABLET | Freq: Four times a day (QID) | ORAL | Status: DC | PRN

## 2017-11-25 MED ORDER — MIDAZOLAM HCL 2 MG/2ML IJ SOLN
INTRAMUSCULAR | Status: AC
  Filled 2017-11-25: qty 2

## 2017-11-25 MED ORDER — ROCURONIUM BROMIDE 10 MG/ML (PF) SYRINGE
PREFILLED_SYRINGE | INTRAVENOUS | Status: AC
  Filled 2017-11-25: qty 5

## 2017-11-25 MED ORDER — FENTANYL CITRATE (PF) 100 MCG/2ML IJ SOLN
INTRAMUSCULAR | Status: DC | PRN
  Administered 2017-11-25 (×5): 50 ug via INTRAVENOUS

## 2017-11-25 MED ORDER — 0.9 % SODIUM CHLORIDE (POUR BTL) OPTIME
TOPICAL | Status: DC | PRN
  Administered 2017-11-25: 1000 mL

## 2017-11-25 MED ORDER — SODIUM CHLORIDE 0.9 % IV SOLN
2.0000 g | INTRAVENOUS | Status: DC
  Administered 2017-11-25: 2 g via INTRAVENOUS
  Filled 2017-11-25: qty 20
  Filled 2017-11-25: qty 2

## 2017-11-25 MED ORDER — LACTATED RINGERS IV SOLN
INTRAVENOUS | Status: DC
  Administered 2017-11-25: 13:00:00 via INTRAVENOUS

## 2017-11-25 MED ORDER — ACETAMINOPHEN 160 MG/5ML PO SOLN: 325.0000 mg | ORAL | Status: DC | PRN

## 2017-11-25 MED ORDER — ALUM & MAG HYDROXIDE-SIMETH 200-200-20 MG/5ML PO SUSP: 30.0000 mL | Freq: Four times a day (QID) | ORAL | Status: DC | PRN

## 2017-11-25 MED ORDER — OXYCODONE HCL 5 MG/5ML PO SOLN
5.0000 mg | Freq: Once | ORAL | Status: DC | PRN
  Filled 2017-11-25: qty 5

## 2017-11-25 MED ORDER — SUGAMMADEX SODIUM 500 MG/5ML IV SOLN
INTRAVENOUS | Status: DC | PRN
  Administered 2017-11-25: 450 mg via INTRAVENOUS

## 2017-11-25 MED ORDER — MIDAZOLAM HCL 5 MG/5ML IJ SOLN
INTRAMUSCULAR | Status: DC | PRN
  Administered 2017-11-25: 2 mg via INTRAVENOUS

## 2017-11-25 MED ORDER — ACETAMINOPHEN 325 MG PO TABS: 325.0000 mg | ORAL_TABLET | ORAL | Status: DC | PRN

## 2017-11-25 MED ORDER — ACETAMINOPHEN 650 MG RE SUPP: 650.0000 mg | Freq: Four times a day (QID) | RECTAL | Status: DC | PRN

## 2017-11-25 MED ORDER — LIDOCAINE 2% (20 MG/ML) 5 ML SYRINGE
INTRAMUSCULAR | Status: AC
  Filled 2017-11-25: qty 5

## 2017-11-25 MED ORDER — ACETAMINOPHEN 325 MG PO TABS: 650.0000 mg | ORAL_TABLET | Freq: Four times a day (QID) | ORAL | Status: DC | PRN

## 2017-11-25 MED ORDER — BUPIVACAINE-EPINEPHRINE 0.5% -1:200000 IJ SOLN
INTRAMUSCULAR | Status: DC | PRN
  Administered 2017-11-25: 30 mL

## 2017-11-25 MED ORDER — IOPAMIDOL (ISOVUE-300) INJECTION 61%
INTRAVENOUS | Status: DC | PRN
  Administered 2017-11-25: 6.5 mL

## 2017-11-25 MED ORDER — KCL IN DEXTROSE-NACL 20-5-0.45 MEQ/L-%-% IV SOLN: INTRAVENOUS | Status: DC

## 2017-11-25 MED ORDER — DIPHENHYDRAMINE HCL 50 MG/ML IJ SOLN: 12.5000 mg | Freq: Four times a day (QID) | INTRAMUSCULAR | Status: DC | PRN

## 2017-11-25 MED ORDER — LACTATED RINGERS IR SOLN
Status: DC | PRN
  Administered 2017-11-25: 1000 mL

## 2017-11-25 MED ORDER — EPHEDRINE 5 MG/ML INJ
INTRAVENOUS | Status: AC
  Filled 2017-11-25: qty 10

## 2017-11-25 MED ORDER — SIMETHICONE 80 MG PO CHEW: 40.0000 mg | CHEWABLE_TABLET | Freq: Four times a day (QID) | ORAL | Status: DC | PRN

## 2017-11-25 MED ORDER — ALUM & MAG HYDROXIDE-SIMETH 200-200-20 MG/5ML PO SUSP
30.0000 mL | Freq: Four times a day (QID) | ORAL | Status: DC | PRN
  Administered 2017-11-25 – 2017-11-26 (×2): 30 mL via ORAL
  Filled 2017-11-25 (×2): qty 30

## 2017-11-25 MED ORDER — FENTANYL CITRATE (PF) 250 MCG/5ML IJ SOLN
INTRAMUSCULAR | Status: AC
  Filled 2017-11-25: qty 5

## 2017-11-25 MED ORDER — SODIUM CHLORIDE 0.9 % IJ SOLN
INTRAMUSCULAR | Status: AC
  Filled 2017-11-25: qty 10

## 2017-11-25 MED ORDER — EPHEDRINE SULFATE 50 MG/ML IJ SOLN
INTRAMUSCULAR | Status: DC | PRN
  Administered 2017-11-25: 5 mg via INTRAVENOUS

## 2017-11-25 MED ORDER — MORPHINE SULFATE (PF) 2 MG/ML IV SOLN: 1.0000 mg | INTRAVENOUS | Status: DC | PRN

## 2017-11-25 MED ORDER — ROCURONIUM BROMIDE 50 MG/5ML IV SOSY
PREFILLED_SYRINGE | INTRAVENOUS | Status: DC | PRN
  Administered 2017-11-25: 60 mg via INTRAVENOUS

## 2017-11-25 MED ORDER — IOPAMIDOL (ISOVUE-300) INJECTION 61%
INTRAVENOUS | Status: AC
  Filled 2017-11-25: qty 50

## 2017-11-25 MED ORDER — DEXAMETHASONE SODIUM PHOSPHATE 10 MG/ML IJ SOLN
INTRAMUSCULAR | Status: AC
  Filled 2017-11-25: qty 1

## 2017-11-25 MED ORDER — MEPERIDINE HCL 50 MG/ML IJ SOLN: 6.2500 mg | INTRAMUSCULAR | Status: DC | PRN

## 2017-11-25 MED ORDER — TRAMADOL HCL 50 MG PO TABS: 50.0000 mg | ORAL_TABLET | Freq: Four times a day (QID) | ORAL | Status: DC | PRN

## 2017-11-25 MED ORDER — OXYCODONE HCL 5 MG PO TABS: 5.0000 mg | ORAL_TABLET | Freq: Once | ORAL | Status: DC | PRN

## 2017-11-25 MED ORDER — LIDOCAINE 2% (20 MG/ML) 5 ML SYRINGE
INTRAMUSCULAR | Status: DC | PRN
  Administered 2017-11-25: 100 mg via INTRAVENOUS

## 2017-11-25 MED ORDER — PROPOFOL 10 MG/ML IV BOLUS
INTRAVENOUS | Status: AC
  Filled 2017-11-25: qty 20

## 2017-11-25 MED ORDER — KCL IN DEXTROSE-NACL 20-5-0.45 MEQ/L-%-% IV SOLN
INTRAVENOUS | Status: DC
  Administered 2017-11-25: 16:00:00 via INTRAVENOUS
  Filled 2017-11-25: qty 1000

## 2017-11-25 SURGICAL SUPPLY — 33 items
APPLIER CLIP ROT 10 11.4 M/L (STAPLE) ×3
APR CLP MED LRG 11.4X10 (STAPLE) ×1
BAG SPEC RTRVL LRG 6X4 10 (ENDOMECHANICALS) ×1
CABLE HIGH FREQUENCY MONO STRZ (ELECTRODE) ×3 IMPLANT
CHLORAPREP W/TINT 26ML (MISCELLANEOUS) ×4 IMPLANT
CLIP APPLIE ROT 10 11.4 M/L (STAPLE) ×1 IMPLANT
CLOSURE WOUND 1/2 X4 (GAUZE/BANDAGES/DRESSINGS) ×1
COVER MAYO STAND STRL (DRAPES) ×3 IMPLANT
COVER SURGICAL LIGHT HANDLE (MISCELLANEOUS) ×3 IMPLANT
DECANTER SPIKE VIAL GLASS SM (MISCELLANEOUS) ×1 IMPLANT
DRAPE C-ARM 42X120 X-RAY (DRAPES) ×3 IMPLANT
ELECT REM PT RETURN 15FT ADLT (MISCELLANEOUS) ×3 IMPLANT
GAUZE SPONGE 2X2 8PLY STRL LF (GAUZE/BANDAGES/DRESSINGS) ×1 IMPLANT
GLOVE SURG ORTHO 8.0 STRL STRW (GLOVE) ×3 IMPLANT
GOWN STRL REUS W/TWL XL LVL3 (GOWN DISPOSABLE) ×6 IMPLANT
HEMOSTAT SURGICEL 4X8 (HEMOSTASIS) IMPLANT
KIT BASIN OR (CUSTOM PROCEDURE TRAY) ×3 IMPLANT
POUCH SPECIMEN RETRIEVAL 10MM (ENDOMECHANICALS) ×3 IMPLANT
SCISSORS LAP 5X35 DISP (ENDOMECHANICALS) ×3 IMPLANT
SET CHOLANGIOGRAPH MIX (MISCELLANEOUS) ×3 IMPLANT
SET IRRIG TUBING LAPAROSCOPIC (IRRIGATION / IRRIGATOR) ×3 IMPLANT
SLEEVE XCEL OPT CAN 5 100 (ENDOMECHANICALS) ×3 IMPLANT
SPONGE GAUZE 2X2 STER 10/PKG (GAUZE/BANDAGES/DRESSINGS) ×2
STRIP CLOSURE SKIN 1/2X4 (GAUZE/BANDAGES/DRESSINGS) ×2 IMPLANT
SUT MNCRL AB 4-0 PS2 18 (SUTURE) ×3 IMPLANT
TAPE CLOTH SURG 4X10 WHT LF (GAUZE/BANDAGES/DRESSINGS) ×2 IMPLANT
TOWEL OR 17X26 10 PK STRL BLUE (TOWEL DISPOSABLE) ×3 IMPLANT
TOWEL OR NON WOVEN STRL DISP B (DISPOSABLE) ×3 IMPLANT
TRAY LAPAROSCOPIC (CUSTOM PROCEDURE TRAY) ×3 IMPLANT
TROCAR BLADELESS OPT 5 100 (ENDOMECHANICALS) ×3 IMPLANT
TROCAR XCEL BLUNT TIP 100MML (ENDOMECHANICALS) ×3 IMPLANT
TROCAR XCEL NON-BLD 11X100MML (ENDOMECHANICALS) ×3 IMPLANT
TUBING INSUF HEATED (TUBING) ×2 IMPLANT

## 2017-11-25 NOTE — H&P (Signed)
[name] Documented: 11/25/2017 9:16 AM Location: [facility] Patient #: [id number] DOB: [birth year] Single / Language: [name] / Race: Black or African American Female  History of Present Illness Meghan Hiss M. Daiki Dicostanzo MD; 11/25/2017 12:09 PM) The patient is a 32 year old female who presents for evaluation of gall stones. She was sent by [facility] emergency room to our clinic this morning after being seen there last night for severe epigastric pain. She states that she developed epigastric pain that radiated to her back. It was a 10 out of 10. She never had anything as severe as that. She also had nausea and subjective chills. But no fever. She was given medications and a GI cocktail which she said helped some. She states that she still having discomfort today. She hasn't felt like eating or drinking anything. She rates her pain as a 6 out of 10 this morning. She states over the past few months she's been having pain after eating. She describes it as a tightening up sensation in her upper abdomen along with nausea. It'll last 1-2 hours and then subside. She also reports some green stools. She takes Advil couple times a week. She denies any unplanned weight loss. She denies any melena or hematochezia. She has multiple sclerosis and gets an injection every 6 months.  In the emergency room she had an ultrasound which showed gallstones and mild wall thickening and mild fluid around the gallbladder. Her blood work was for the most part normal   Problem List/Past Medical Meghan Hiss M. Redmond Pulling, MD; 11/25/2017 12:10 PM) ACUTE CALCULOUS CHOLECYSTITIS (K80.00) MULTIPLE SCLEROSIS (G35)  Past Surgical History [name], CMA; 11/25/2017 11:57 AM) No pertinent past surgical history  Diagnostic Studies History [name], CMA; 11/25/2017 11:57 AM) Colonoscopy never Mammogram 1-3 years ago  Allergies [name], CMA; 11/25/2017 9:21 AM) No Known Drug Allergies  [11/25/2017]:  Medication History ([name], CMA; 11/25/2017 9:22 AM) De Nurse (300MG Lowella Grip Solution, Intravenous) Active. Medications Reconciled  Pregnancy / Birth History [name], CMA; 11/25/2017 11:57 AM) Age at menarche 39 years. Gravida 5 Irregular periods Maternal age 34-20 Para 3  Other Problems Meghan Hiss M. Redmond Pulling, MD; 11/25/2017 12:10 PM) Back Pain Gastroesophageal Reflux Disease Other disease, cancer, significant illness     Review of Systems ([name] CMA; 11/25/2017 11:57 AM) General Present- Appetite Loss, Chills, Fatigue and Weight Gain. Not Present- Fever, Night Sweats and Weight Loss. HEENT Not Present- Earache, Hearing Loss, Hoarseness, Nose Bleed, Oral Ulcers, Ringing in the Ears, Seasonal Allergies, Sinus Pain, Sore Throat, Visual Disturbances, Wears glasses/contact lenses and Yellow Eyes. Respiratory Not Present- Bloody sputum, Chronic Cough, Difficulty Breathing, Snoring and Wheezing. Breast Not Present- Breast Mass, Breast Pain, Nipple Discharge and Skin Changes. Gastrointestinal Present- Abdominal Pain, Bloating, Chronic diarrhea, Constipation, Excessive gas, Gets full quickly at meals, Indigestion, Nausea and Vomiting. Not Present- Bloody Stool, Change in Bowel Habits, Difficulty Swallowing, Hemorrhoids and Rectal Pain. Female Genitourinary Present- Pelvic Pain. Not Present- Frequency, Nocturia, Painful Urination and Urgency. Musculoskeletal Present- Back Pain. Not Present- Joint Pain, Joint Stiffness, Muscle Pain, Muscle Weakness and Swelling of Extremities. Neurological Present- Numbness and Tingling. Not Present- Decreased Memory, Fainting, Headaches, Seizures, Tremor, Trouble walking and Weakness. Psychiatric Not Present- Anxiety, Bipolar, Change in Sleep Pattern, Depression, Fearful and Frequent crying. Endocrine Not Present- Cold Intolerance, Excessive Hunger, Hair Changes, Heat Intolerance, Hot flashes and New Diabetes. Hematology  Not Present- Blood Thinners, Easy Bruising, Excessive bleeding, Gland problems, HIV and Persistent Infections.  Vitals ([name] CMA; 11/25/2017 9:21 AM) 11/25/2017  9:21 AM Weight: 238 lb Height: 64in Body Surface Area: 2.11 m Body Mass Index: 40.85 kg/m  Pulse: 70 (Regular)  BP: 118/82 (Sitting, Left Arm, Standard)      Physical Exam Meghan Hiss M. Meghan Credit MD; 11/25/2017 12:06 PM)  General Mental Status-Alert. General Appearance-Consistent with stated age. Hydration-Well hydrated. Voice-Normal.  Head and Neck Head-normocephalic, atraumatic with no lesions or palpable masses. Trachea-midline. Thyroid Gland Characteristics - normal size and consistency.  Eye Eyeball - Bilateral-Extraocular movements intact. Sclera/Conjunctiva - Bilateral-No scleral icterus.  ENMT Ears -Note:normal ext ears.  Mouth and Throat -Note:lips intact.   Chest and Lung Exam Chest and lung exam reveals -quiet, even and easy respiratory effort with no use of accessory muscles and on auscultation, normal breath sounds, no adventitious sounds and normal vocal resonance. Inspection Chest Wall - Normal. Back - normal.  Breast - Did not examine.  Cardiovascular Cardiovascular examination reveals -normal heart sounds, regular rate and rhythm with no murmurs and normal pedal pulses bilaterally.  Abdomen Inspection Inspection of the abdomen reveals - No Hernias. Skin - Scar - no surgical scars. Palpation/Percussion Palpation and Percussion of the abdomen reveal - Soft, No Rebound tenderness, No Rigidity (guarding) and No hepatosplenomegaly. Note: some TTP in epigastrium and slightly in RUQ. Auscultation Auscultation of the abdomen reveals - Bowel sounds normal.  Peripheral Vascular Upper Extremity Palpation - Pulses bilaterally normal.  Neurologic Neurologic evaluation reveals -alert and oriented x 3 with no impairment of recent or remote memory. Mental  Status-Normal.  Neuropsychiatric The patient's mood and affect are described as -normal. Judgment and Insight-insight is appropriate concerning matters relevant to self.  Musculoskeletal Normal Exam - Left-Upper Extremity Strength Normal and Lower Extremity Strength Normal. Normal Exam - Right-Upper Extremity Strength Normal and Lower Extremity Strength Normal.  Lymphatic Head & Neck  General Head & Neck Lymphatics: Bilateral - Description - Normal. Axillary - Did not examine. Femoral & Inguinal - Did not examine.    Assessment & Plan Meghan Hiss M. Aristotelis Vilardi MD; 11/25/2017 12:10 PM)  ACUTE CALCULOUS CHOLECYSTITIS (K80.00) Impression: I reviewed her ER records. I believe she deftly has symptomatic cholelithiasis and I'm concerned that she is developing cholecystitis since she is still having pain and is still tender on exam after being seen last night in the emergency room. Therefore I don't believe outpatient management is an option. I have recommended that she be admitted to our surgical service at [facility] for IV antibiotics and cholecystectomy. We briefly discussed gallbladder surgery and she was given educational material. I will let the hospital team discuss surgery further. I instructed the patient to get a [place] long admitting and not to eat or drink anything in route. I discussed the case with Dr. Harlow Asa  Current Plans Pt Education - [facility] Free Text Education/Instructions: discussed with patient and provided information. Pt Education - Pamphlet Given - Laparoscopic Gallbladder Surgery: discussed with patient and provided information.  MULTIPLE SCLEROSIS (G35)  Leighton Ruff. Redmond Pulling, MD, FACS General, Bariatric, & Minimally Invasive Surgery [facility], [place]

## 2017-11-25 NOTE — Interval H&P Note (Signed)
History and Physical Interval Note:  11/25/2017 12:14 PM  [name]  has presented today for surgery, with the diagnosis of acute cholecystitis.  The various methods of treatment have been discussed with the patient and family. After consideration of risks, benefits and other options for treatment, the patient has consented to    Procedure(s): LAPAROSCOPIC CHOLECYSTECTOMY WITH INTRAOPERATIVE CHOLANGIOGRAM (N/A) as a surgical intervention.   The patient's history has been reviewed, patient examined, no change in status, stable for surgery.  I have reviewed the patient's chart and labs.  Questions were answered to the patient's satisfaction.    Armandina Gemma, [place] [facility] Office: [place]

## 2017-11-25 NOTE — Anesthesia Procedure Notes (Signed)
Procedure Name: Intubation Date/Time: 11/25/2017 1:02 PM Performed by: West Pugh, CRNA Pre-anesthesia Checklist: Patient identified, Emergency Drugs available, Suction available, Patient being monitored and Timeout performed Patient Re-evaluated:Patient Re-evaluated prior to induction Oxygen Delivery Method: Circle system utilized Preoxygenation: Pre-oxygenation with 100% oxygen Induction Type: IV induction Ventilation: Mask ventilation without difficulty Laryngoscope Size: Mac and 4 Grade View: Grade I Tube type: Oral Tube size: 7.0 mm Number of attempts: 1 Airway Equipment and Method: Stylet Placement Confirmation: ETT inserted through vocal cords under direct vision,  positive ETCO2,  CO2 detector and breath sounds checked- equal and bilateral Secured at: 22 cm Tube secured with: Tape Dental Injury: Teeth and Oropharynx as per pre-operative assessment

## 2017-11-25 NOTE — Transfer of Care (Signed)
Immediate Anesthesia Transfer of Care Note  Patient: [name]  Procedure(s) Performed: LAPAROSCOPIC CHOLECYSTECTOMY WITH INTRAOPERATIVE CHOLANGIOGRAM (N/A Abdomen)  Patient Location: PACU  Anesthesia Type:General  Level of Consciousness: awake, alert  and patient cooperative  Airway & Oxygen Therapy: Patient Spontanous Breathing and Patient connected to face mask oxygen  Post-op Assessment: Report given to RN and Post -op Vital signs reviewed and stable  Post vital signs: Reviewed and stable  Last Vitals:  Vitals Value Taken Time  BP 119/72 11/25/2017  2:22 PM  Temp 36.4 C 11/25/2017  2:22 PM  Pulse 61 11/25/2017  2:27 PM  Resp 14 11/25/2017  2:27 PM  SpO2 100 % 11/25/2017  2:27 PM  Vitals shown include unvalidated device data.  Last Pain:  Vitals:   11/25/17 1226  TempSrc:   PainSc: 6       Patients Stated Pain Goal: 3 (54/65/03 [id number])  Complications: No apparent anesthesia complications

## 2017-11-25 NOTE — Discharge Instructions (Signed)

## 2017-11-25 NOTE — H&P (View-Only) (Signed)
Sheran Fava Documented: 11/25/2017 9:16 AM Location: [facility] Patient #: [id number] DOB: 01/23/1986 Single / Language: [name] / Race: Black or African American Female  History of Present Illness Randall Hiss M. Paislyn Domenico MD; 11/25/2017 12:09 PM) The patient is a 32 year old female who presents for evaluation of gall stones. She was sent by [facility] emergency room to our clinic this morning after being seen there last night for severe epigastric pain. She states that she developed epigastric pain that radiated to her back. It was a 10 out of 10. She never had anything as severe as that. She also had nausea and subjective chills. But no fever. She was given medications and a GI cocktail which she said helped some. She states that she still having discomfort today. She hasn't felt like eating or drinking anything. She rates her pain as a 6 out of 10 this morning. She states over the past few months she's been having pain after eating. She describes it as a tightening up sensation in her upper abdomen along with nausea. It'll last 1-2 hours and then subside. She also reports some green stools. She takes Advil couple times a week. She denies any unplanned weight loss. She denies any melena or hematochezia. She has multiple sclerosis and gets an injection every 6 months.  In the emergency room she had an ultrasound which showed gallstones and mild wall thickening and mild fluid around the gallbladder. Her blood work was for the most part normal   Problem List/Past Medical [name], MD; 11/25/2017 12:10 PM) ACUTE CALCULOUS CHOLECYSTITIS (K80.00) MULTIPLE SCLEROSIS (G35)  Past Surgical History [name], CMA; 11/25/2017 11:57 AM) No pertinent past surgical history  Diagnostic Studies History [name], CMA; 11/25/2017 11:57 AM) Colonoscopy never Mammogram 1-3 years ago  Allergies [name], CMA; 11/25/2017 9:21 AM) No Known Drug Allergies  [11/25/2017]:  Medication History ([name], CMA; 11/25/2017 9:22 AM) De Nurse (300MG Lowella Grip Solution, Intravenous) Active. Medications Reconciled  Pregnancy / Birth History [name], CMA; 11/25/2017 11:57 AM) Age at menarche 89 years. Gravida 5 Irregular periods Maternal age 19-20 Para 3  Other Problems [name], MD; 11/25/2017 12:10 PM) Back Pain Gastroesophageal Reflux Disease Other disease, cancer, significant illness     Review of Systems ([name] CMA; 11/25/2017 11:57 AM) General Present- Appetite Loss, Chills, Fatigue and Weight Gain. Not Present- Fever, Night Sweats and Weight Loss. HEENT Not Present- Earache, Hearing Loss, Hoarseness, Nose Bleed, Oral Ulcers, Ringing in the Ears, Seasonal Allergies, Sinus Pain, Sore Throat, Visual Disturbances, Wears glasses/contact lenses and Yellow Eyes. Respiratory Not Present- Bloody sputum, Chronic Cough, Difficulty Breathing, Snoring and Wheezing. Breast Not Present- Breast Mass, Breast Pain, Nipple Discharge and Skin Changes. Gastrointestinal Present- Abdominal Pain, Bloating, Chronic diarrhea, Constipation, Excessive gas, Gets full quickly at meals, Indigestion, Nausea and Vomiting. Not Present- Bloody Stool, Change in Bowel Habits, Difficulty Swallowing, Hemorrhoids and Rectal Pain. Female Genitourinary Present- Pelvic Pain. Not Present- Frequency, Nocturia, Painful Urination and Urgency. Musculoskeletal Present- Back Pain. Not Present- Joint Pain, Joint Stiffness, Muscle Pain, Muscle Weakness and Swelling of Extremities. Neurological Present- Numbness and Tingling. Not Present- Decreased Memory, Fainting, Headaches, Seizures, Tremor, Trouble walking and Weakness. Psychiatric Not Present- Anxiety, Bipolar, Change in Sleep Pattern, Depression, Fearful and Frequent crying. Endocrine Not Present- Cold Intolerance, Excessive Hunger, Hair Changes, Heat Intolerance, Hot flashes and New Diabetes. Hematology  Not Present- Blood Thinners, Easy Bruising, Excessive bleeding, Gland problems, HIV and Persistent Infections.  Vitals ([name] CMA; 11/25/2017 9:21 AM) 11/25/2017  9:21 AM Weight: 238 lb Height: 64in Body Surface Area: 2.11 m Body Mass Index: 40.85 kg/m  Pulse: 70 (Regular)  BP: 118/82 (Sitting, Left Arm, Standard)      Physical Exam Randall Hiss M. Shaleigh Laubscher MD; 11/25/2017 12:06 PM)  General Mental Status-Alert. General Appearance-Consistent with stated age. Hydration-Well hydrated. Voice-Normal.  Head and Neck Head-normocephalic, atraumatic with no lesions or palpable masses. Trachea-midline. Thyroid Gland Characteristics - normal size and consistency.  Eye Eyeball - Bilateral-Extraocular movements intact. Sclera/Conjunctiva - Bilateral-No scleral icterus.  ENMT Ears -Note:normal ext ears.  Mouth and Throat -Note:lips intact.   Chest and Lung Exam Chest and lung exam reveals -quiet, even and easy respiratory effort with no use of accessory muscles and on auscultation, normal breath sounds, no adventitious sounds and normal vocal resonance. Inspection Chest Wall - Normal. Back - normal.  Breast - Did not examine.  Cardiovascular Cardiovascular examination reveals -normal heart sounds, regular rate and rhythm with no murmurs and normal pedal pulses bilaterally.  Abdomen Inspection Inspection of the abdomen reveals - No Hernias. Skin - Scar - no surgical scars. Palpation/Percussion Palpation and Percussion of the abdomen reveal - Soft, No Rebound tenderness, No Rigidity (guarding) and No hepatosplenomegaly. Note: some TTP in epigastrium and slightly in RUQ. Auscultation Auscultation of the abdomen reveals - Bowel sounds normal.  Peripheral Vascular Upper Extremity Palpation - Pulses bilaterally normal.  Neurologic Neurologic evaluation reveals -alert and oriented x 3 with no impairment of recent or remote memory. Mental  Status-Normal.  Neuropsychiatric The patient's mood and affect are described as -normal. Judgment and Insight-insight is appropriate concerning matters relevant to self.  Musculoskeletal Normal Exam - Left-Upper Extremity Strength Normal and Lower Extremity Strength Normal. Normal Exam - Right-Upper Extremity Strength Normal and Lower Extremity Strength Normal.  Lymphatic Head & Neck  General Head & Neck Lymphatics: Bilateral - Description - Normal. Axillary - Did not examine. Femoral & Inguinal - Did not examine.    Assessment & Plan Randall Hiss M. Kesi Perrow MD; 11/25/2017 12:10 PM)  ACUTE CALCULOUS CHOLECYSTITIS (K80.00) Impression: I reviewed her ER records. I believe she deftly has symptomatic cholelithiasis and I'm concerned that she is developing cholecystitis since she is still having pain and is still tender on exam after being seen last night in the emergency room. Therefore I don't believe outpatient management is an option. I have recommended that she be admitted to our surgical service at [facility] for IV antibiotics and cholecystectomy. We briefly discussed gallbladder surgery and she was given educational material. I will let the hospital team discuss surgery further. I instructed the patient to get a [place] long admitting and not to eat or drink anything in route. I discussed the case with Dr. Harlow Asa  Current Plans Pt Education - [facility] Free Text Education/Instructions: discussed with patient and provided information. Pt Education - Pamphlet Given - Laparoscopic Gallbladder Surgery: discussed with patient and provided information.  MULTIPLE SCLEROSIS (G35)  Leighton Ruff. Redmond Pulling, MD, FACS General, Bariatric, & Minimally Invasive Surgery [facility], [place]

## 2017-11-25 NOTE — Op Note (Signed)
Procedure Note  Pre-operative Diagnosis:  Acute cholecystitis, cholelithiasis  Post-operative Diagnosis:  same  Surgeon:  Armandina Gemma, MD  Assistant:  none   Procedure:  Laparoscopic cholecystectomy with intra-operative cholangiography  Anesthesia:  General  Estimated Blood Loss:  minimal  Drains: none         Specimen: Gallbladder to pathology  Indications:  The patient is a 32 year old female who presents for evaluation of gall stones. She was sent by [facility] emergency room to our clinic this morning after being seen there last night for severe epigastric pain. She states that she developed epigastric pain that radiated to her back. It was a 10 out of 10. She never had anything as severe as that. She also had nausea and subjective chills. But no fever. She was given medications and a GI cocktail which she said helped some. She states that she still having discomfort today. She hasn't felt like eating or drinking anything. She rates her pain as a 6 out of 10 this morning. She states over the past few months she's been having pain after eating. She describes it as a tightening up sensation in her upper abdomen along with nausea. It'll last 1-2 hours and then subside. She also reports some green stools. She takes Advil couple times a week. She denies any unplanned weight loss. She denies any melena or hematochezia. She has multiple sclerosis and gets an injection every 6 months.  In the emergency room she had an ultrasound which showed gallstones and mild wall thickening and mild fluid around the gallbladder. Her blood work was for the most part normal  Procedure Details:  The patient was seen in the pre-op holding area. The risks, benefits, complications, treatment options, and expected outcomes were previously discussed with the patient. The patient agreed with the proposed plan and has signed the informed consent form.  The patient was brought to the Operating  Room, identified as JADEN BATCHELDER and the procedure verified as laparoscopic cholecystectomy with intraoperative cholangiography. A "time out" was completed and the above information confirmed.  The patient was placed in the supine position. Following induction of general anesthesia, the abdomen was prepped and draped in the usual aseptic fashion.  An incision was made in the skin near the umbilicus. The midline fascia was incised and the peritoneal cavity was entered and a Hasson canula was introduced under direct vision.  The Hasson canula was secured with a 0-Vicryl pursestring suture. Pneumoperitoneum was established with carbon dioxide. Additional trocars were introduced under direct vision along the right costal margin in the midline, mid-clavicular line, and anterior axillary line.   The gallbladder was identified and the fundus grasped and retracted cephalad. Adhesions were taken down bluntly and the electrocautery was utilized as needed, taking care not to injure any adjacent structures. The infundibulum was grasped and retracted laterally, exposing the peritoneum overlying the triangle of Calot. The peritoneum was incised and structures exposed with blunt dissection. The cystic duct was clearly identified, bluntly dissected circumferentially, and clipped at the neck of the gallbladder.  An incision was made in the cystic duct and the cholangiogram catheter introduced. The catheter was secured using an ligaclip.  Real-time cholangiography was performed using C-arm fluoroscopy.  There was rapid filling of a normal caliber common bile duct.  There was reflux of contrast into the left and right hepatic ductal systems.  There was free flow distally into the duodenum without filling defect or obstruction.  The catheter was removed from  the peritoneal cavity.  The cystic duct was then ligated with surgical clips and divided. The cystic artery was identified, dissected circumferentially, ligated with  ligaclips, and divided.  The gallbladder was dissected away from the liver bed using the electrocautery for hemostasis. The gallbladder was completely removed from the liver and placed into an endocatch bag. The gallbladder was removed in the endocatch bag through the umbilical port site and submitted to pathology for review.  The right upper quadrant was irrigated and the gallbladder bed was inspected. Hemostasis was achieved with the electrocautery.  Pneumoperitoneum was released after viewing removal of the trocars with good hemostasis noted. The umbilical wound was irrigated and the fascia was then closed with the pursestring suture.  Local anesthetic was infiltrated at all port sites. The skin incisions were closed with 4-0 Monocril subcuticular sutures and steri-strips and dressings were applied.  Instrument, sponge, and needle counts were correct at the conclusion of the case.  The patient was awakened from anesthesia and brought to the recovery room in stable condition.  The patient tolerated the procedure well.   Armandina Gemma, MD [facility] Office: [phone]

## 2017-11-25 NOTE — Anesthesia Postprocedure Evaluation (Signed)
Anesthesia Post Note  Patient: [name]  Procedure(s) Performed: LAPAROSCOPIC CHOLECYSTECTOMY WITH INTRAOPERATIVE CHOLANGIOGRAM (N/A Abdomen)     Patient location during evaluation: PACU Anesthesia Type: General Level of consciousness: awake and alert Pain management: pain level controlled Vital Signs Assessment: post-procedure vital signs reviewed and stable Respiratory status: spontaneous breathing, nonlabored ventilation and respiratory function stable Cardiovascular status: blood pressure returned to baseline and stable Postop Assessment: no apparent nausea or vomiting Anesthetic complications: no    Last Vitals:  Vitals:   11/25/17 1445 11/25/17 1500  BP: 122/78 120/67  Pulse: 64 65  Resp: 14 13  Temp:  (!) 36.4 C  SpO2: 99% 100%    Last Pain:  Vitals:   11/25/17 1500  TempSrc:   PainSc: Asleep                 Catalina Gravel

## 2017-11-25 NOTE — Anesthesia Preprocedure Evaluation (Signed)
Anesthesia Evaluation  Patient identified by MRN, date of birth, ID band Patient awake    Reviewed: Allergy & Precautions, H&P , NPO status , Patient's Chart, lab work & pertinent test results  Airway Mallampati: II   Neck ROM: full    Dental   Pulmonary neg pulmonary ROS,    breath sounds clear to auscultation       Cardiovascular negative cardio ROS   Rhythm:regular Rate:Normal     Neuro/Psych  Headaches, PSYCHIATRIC DISORDERS Multiple sclerosis Acute relapsing multiple sclerosis     Neuromuscular disease    GI/Hepatic   Endo/Other  Morbid obesity  Renal/GU      Musculoskeletal   Abdominal   Peds  Hematology   Anesthesia Other Findings   Reproductive/Obstetrics                             Anesthesia Physical  Anesthesia Plan  ASA: II  Anesthesia Plan: General   Post-op Pain Management:    Induction: Intravenous  PONV Risk Score and Plan: 3 and Ondansetron, Dexamethasone, Midazolam and Treatment may vary due to age or medical condition  Airway Management Planned: Oral ETT  Additional Equipment:   Intra-op Plan:   Post-operative Plan: Extubation in OR  Informed Consent: I have reviewed the patients History and Physical, chart, labs and discussed the procedure including the risks, benefits and alternatives for the proposed anesthesia with the patient or authorized representative who has indicated his/her understanding and acceptance.     Plan Discussed with: CRNA, Anesthesiologist and Surgeon  Anesthesia Plan Comments: (  )        Anesthesia Quick Evaluation

## 2017-11-26 ENCOUNTER — Encounter (HOSPITAL_COMMUNITY): Payer: Self-pay | Admitting: Surgery

## 2017-11-26 DIAGNOSIS — K8010 Calculus of gallbladder with chronic cholecystitis without obstruction: Secondary | ICD-10-CM | POA: Diagnosis not present

## 2017-11-26 MED ORDER — HYDROCODONE-ACETAMINOPHEN 5-325 MG PO TABS: 1.0000 | ORAL_TABLET | ORAL | 0 refills | Status: DC | PRN

## 2017-11-26 MED ORDER — IBUPROFEN 800 MG PO TABS: 400.0000 mg | ORAL_TABLET | Freq: Three times a day (TID) | ORAL | 0 refills | Status: DC | PRN

## 2017-11-26 MED ORDER — ACETAMINOPHEN 325 MG PO TABS: 650.0000 mg | ORAL_TABLET | Freq: Four times a day (QID) | ORAL | Status: DC | PRN

## 2017-11-26 NOTE — Discharge Summary (Signed)
[facility] Discharge Summary   Patient ID: [name] MRN: [id number] DOB/AGE: 1986-03-06 32 y.o.  Admit date: 11/25/2017 Discharge date: 11/26/2017  Discharge Diagnosis Patient Active Problem List   Diagnosis Date Noted  . Cholecystitis, acute with cholelithiasis 11/25/2017  . Acute pain of left knee 10/04/2017  . Fibroid of cervix   . Cervical mass 12/23/2016  . Child behavior problem 03/27/2016  . Breast mass 02/09/2016  . B12 deficiency 10/04/2013  . Unspecified vitamin D deficiency 10/04/2013  . Malabsorption 08/29/2013  . 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 Insomnia 06/27/2013  . Adjustment disorder with mixed anxiety and depressed mood 03/13/2013  . Multiple sclerosis ([place]) 03/13/2013  . Abdominal pain 07/29/2007  . OBESITY, NOS 08/12/2006    Consultants None   Imaging: Dg Cholangiogram Operative  Result Date: 11/25/2017 CLINICAL DATA:  32 year old female undergoing laparoscopic cholecystectomy. EXAM: INTRAOPERATIVE CHOLANGIOGRAM TECHNIQUE: Cholangiographic images from the C-arm fluoroscopic device were submitted for interpretation post-operatively. Please see the procedural report for the amount of contrast and the fluoroscopy time utilized. COMPARISON:  Abdominal ultrasound 11/24/2017 FINDINGS: A single cine loop is submitted for review. The images demonstrate cannulation of the cystic duct remanent and opacification of the biliary tree. No evidence of biliary ductal dilatation, stenosis, stricture or choledocholithiasis. Contrast material passes freely through the ampulla and into the duodenum. IMPRESSION: Negative intraoperative cholangiogram. Electronically Signed   By: Jacqulynn Cadet M.D.   On: 11/25/2017 15:44   US Abdomen Limited Ruq  Result Date: 11/24/2017 CLINICAL DATA:  Epigastric pain EXAM: ULTRASOUND ABDOMEN LIMITED RIGHT UPPER QUADRANT COMPARISON:  None. FINDINGS: Gallbladder: Multiple stones are identified within the  gallbladder measuring up to 1.2 cm. Negative sonographic Murphy's sign. Areas of mild gallbladder wall thickening measuring up to 4 mm noted. There is a small amount of pericholecystic fluid. Common bile duct: Diameter: 6 mm. Liver: No focal lesion identified. Within normal limits in parenchymal echogenicity. Portal vein is patent on color Doppler imaging with normal direction of blood flow towards the liver. IMPRESSION: 1. Gallstone, pericholecystic fluid and mild gallbladder wall thickening. Suspicious for acute cholecystitis. If further imaging is clinically indicated consider nuclear medicine hepatic biliary to assess patency of the cystic duct. Electronically Signed   By: Kerby Moors M.D.   On: 11/24/2017 21:57    Procedures Dr. Armandina Gemma (11/25/17) - Laparoscopic Cholecystectomy with [place] Hospital Course:  The patient is a 32 year old female who presented to [place] office for evaluation of gall stones. She states that she developed epigastric pain that radiated to her back. It was a 10 out of 10. She never had anything as severe as that. She also had nausea and subjective chills. She had an ultrasound which showed gallstones and mild wall thickening and mild fluid around the gallbladder and was directly admitted to [facility] for surgical management of acute cholecystitis. She underwent the above operative by Dr. Harlow Asa which she tolerated well. On POD#1 the patients vitals were stable, pain controlled, tolerated PO, mobilizing, urinating without symptoms, and medically stable for discharge home. She denied the need for a work note. She will follow up as below and knows to call with questions or concerns.   Physical Exam: General:  Alert, NAD, pleasant, comfortable Abd:  Soft, ND, appropriately tender, incisions C/D/I  Allergies as of 11/26/2017   No Known Allergies     Medication List    STOP taking these medications   docusate sodium 100 MG capsule Commonly known  as:   COLACE   HYDROmorphone 2 MG tablet Commonly known as:  DILAUDID     TAKE these medications   acetaminophen 325 MG tablet Commonly known as:  TYLENOL Take 2 tablets (650 mg total) by mouth every 6 (six) hours as needed for mild pain (or Fever >/= 101).   cyanocobalamin 1000 MCG/ML injection Commonly known as:  (VITAMIN B-12) Inject 1 mL (1,000 mcg total) into the muscle every 30 (thirty) days. Continue for 3 months, then stop and return for labs.   HYDROcodone-acetaminophen 5-325 MG tablet Commonly known as:  NORCO/VICODIN Take 1 tablet by mouth every 4 (four) hours as needed for moderate pain.   ibuprofen 800 MG tablet Commonly known as:  ADVIL,MOTRIN Take 0.5 tablets (400 mg total) by mouth every 8 (eight) hours as needed for mild pain. What changed:    medication strength  how much to take  when to take this  reasons to take this   OCREVUS 300 MG/10ML injection Generic drug:  ocrelizumab Inject 600 mg into the vein every 6 (six) months.   promethazine 12.5 MG tablet Commonly known as:  PHENERGAN Take 1 tablet (12.5 mg total) by mouth every 6 (six) hours as needed for nausea or vomiting.   Syringe (Disposable) 1 ML Misc 1 Device by Does not apply route every other day.       Follow-up [facility], [place]. Go on 12/07/2017.   Specialty:  General Surgery Why:  Your appointment is 12/07/17 at 8:45 am. Please arrive 30 minutesp rior to your appointment to check in and fill out paperwork. Bring photo ID and insurance information. Contact information: [address] [place] [phone]          Signed: Obie Dredge, [facility] 11/26/2017, 10:48 AM Pager: [phone] Consults: [phone] Mon-Fri 7:00 am-4:30 pm Sat-Sun 7:00 am-11:30 am

## 2017-11-26 NOTE — Progress Notes (Signed)
Discharge instructions reviewed with patient. All questions answered. Patient wheeled to vehicle with belongings by nurse tech. 

## 2017-11-29 ENCOUNTER — Encounter: Payer: Medicare Other | Admitting: Internal Medicine

## 2018-01-03 NOTE — Progress Notes (Signed)
  Subjective:   Patient ID: [name]    DOB: [birth year], 32 y.o. female   MRN: [id number]  [name] is a 32 y.o. female with a history of B12 deficiency, MS, adjustment disorder, cervical fibroid, obesity here for   VAGINAL DISCHARGE  Endorses having pelvic pain off and on since cervical fibroid. Has improved with fibroid removal although still has pain with occasional abnormal vaginal discharge off and on. Wants to be checked for STIs. Sometimes will have a funny smell to it and thinks pH balance might be off. Has h/o STIs years ago as a teenager. No new sexual partners, been with the same partner for 6 years. No missed periods. Discharge consistency: normal Discharge color: white Medications tried: none  Recent antibiotic use: none Sex in last month: night before last Possible STD exposure:no  Symptoms Fever: no Dysuria:no Vaginal bleeding: no Abdomen or Pelvic pain: yes Back pain: no Genital sores or ulcers:no Rash: no Pain during sex: yes Missed menstrual period: no  Review of Systems:  Per HPI. Denies CP, SOB, palpitations, N/V, diarrhea, constipation. [place], medications and smoking status reviewed.  Objective:   BP 136/80   Pulse (!) 55   Temp 98.5 F (36.9 C) (Oral)   Ht 5\' 4"  (1.626 m)   Wt 239 lb 9.6 oz (108.7 kg)   SpO2 97%   BMI 41.13 kg/m  Vitals and nursing note reviewed.  General: obese female, in no acute distress with non-toxic appearance HEENT: normocephalic, atraumatic, moist mucous membranes CV: regular rate and rhythm without murmurs, rubs, or gallops, no lower extremity edema Lungs: clear to auscultation bilaterally with normal work of breathing GYN:  External genitalia within normal limits.  Vaginal mucosa pink, moist, normal rugae.  Nonfriable cervix without lesions, no discharge or bleeding noted on speculum exam.  Bimanual exam revealed normal, nongravid uterus.  No cervical motion tenderness. No adnexal masses bilaterally.   Skin:  warm, dry, no rashes or lesions Extremities: warm and well perfused, normal tone MSK: ROM grossly intact, strength intact, gait normal Neuro: Alert and oriented, speech normal  Assessment & Plan:   Vaginal discharge Wet prep with few clue cells and bacteria although with negative whiff, however will treat for BV given symptoms. Will also obtain GC/CT as well as RPR and HIV per patient request and treat as indicated. Safe sex practices reviewed. Advised patient if results are normal and she continues to have pain to RTC for further workup with possible ultrasound to look for additional structural pathology.  Orders Placed This Encounter  Procedures  . RPR  . HIV antibody (with reflex)  . POCT Wet Prep [facility])   Meds ordered this encounter  Medications  . metroNIDAZOLE (FLAGYL) 500 MG tablet    Sig: Take 1 tablet (500 mg total) by mouth 2 (two) times daily.    Dispense:  14 tablet    Refill:  0   Rory Percy, DO PGY-2, [facility] 01/04/2018 2:07 PM

## 2018-01-04 ENCOUNTER — Encounter: Payer: Self-pay | Admitting: Family Medicine

## 2018-01-04 ENCOUNTER — Ambulatory Visit (INDEPENDENT_AMBULATORY_CARE_PROVIDER_SITE_OTHER): Payer: Medicare Other | Admitting: Family Medicine

## 2018-01-04 ENCOUNTER — Other Ambulatory Visit (HOSPITAL_COMMUNITY)
Admission: RE | Admit: 2018-01-04 | Discharge: 2018-01-04 | Disposition: A | Discharge status: Home / Self Care | Payer: Medicare Other | Source: Ambulatory Visit | Attending: Family Medicine | Admitting: Family Medicine

## 2018-01-04 ENCOUNTER — Other Ambulatory Visit: Payer: Self-pay

## 2018-01-04 VITALS — BP 136/80 | HR 55 | Temp 98.5°F | Ht 64.0 in | Wt 239.6 lb

## 2018-01-04 DIAGNOSIS — Z113 Encounter for screening for infections with a predominantly sexual mode of transmission: Secondary | ICD-10-CM

## 2018-01-04 DIAGNOSIS — Z114 Encounter for screening for human immunodeficiency virus [HIV]: Secondary | ICD-10-CM | POA: Diagnosis not present

## 2018-01-04 DIAGNOSIS — N898 Other specified noninflammatory disorders of vagina: Secondary | ICD-10-CM

## 2018-01-04 LAB — POCT WET PREP (WET MOUNT)
CLUE CELLS WET PREP WHIFF POC: NEGATIVE
TRICHOMONAS WET PREP HPF POC: ABSENT

## 2018-01-04 MED ORDER — METRONIDAZOLE 500 MG PO TABS: 500.0000 mg | ORAL_TABLET | Freq: Two times a day (BID) | ORAL | 0 refills | Status: DC

## 2018-01-04 NOTE — Patient Instructions (Signed)
It was great to see you!  Our plans for today:  - We obtained vaginal swab to test for any infections. We also tested for syphilis and HIV. We will call you with these results.  - Be sure to use a barrier method such as condoms when you have sex to protect against STIs.  Take care and seek immediate care sooner if you develop any concerns.   Dr. [facility]

## 2018-01-04 NOTE — Assessment & Plan Note (Addendum)
Wet prep with few clue cells and bacteria although with negative whiff, however will treat for BV given symptoms. Will also obtain GC/CT as well as RPR and HIV per patient request and treat as indicated. Safe sex practices reviewed. Advised patient if results are normal and she continues to have pain to RTC for further workup with possible ultrasound to look for additional structural pathology.

## 2018-01-04 NOTE — Addendum Note (Signed)
Addended by: Myles Gip on: 01/04/2018 02:58 PM   Modules accepted: Orders

## 2018-01-05 LAB — CERVICOVAGINAL ANCILLARY ONLY
Chlamydia: NEGATIVE
Neisseria Gonorrhea: NEGATIVE

## 2018-01-05 LAB — RPR: RPR: NONREACTIVE

## 2018-01-05 LAB — HIV ANTIBODY (ROUTINE TESTING W REFLEX): HIV Screen 4th Generation wRfx: NONREACTIVE

## 2018-05-08 ENCOUNTER — Emergency Department (HOSPITAL_COMMUNITY)
Admission: EM | Admit: 2018-05-08 | Discharge: 2018-05-08 | Disposition: A | Discharge status: Home / Self Care | Payer: Medicare Other | Attending: Emergency Medicine | Admitting: Emergency Medicine

## 2018-05-08 ENCOUNTER — Encounter (HOSPITAL_COMMUNITY): Payer: Self-pay | Admitting: *Deleted

## 2018-05-08 DIAGNOSIS — Y93G1 Activity, food preparation and clean up: Secondary | ICD-10-CM | POA: Insufficient documentation

## 2018-05-08 DIAGNOSIS — X118XXA Contact with other hot tap-water, initial encounter: Secondary | ICD-10-CM | POA: Diagnosis not present

## 2018-05-08 DIAGNOSIS — T24211A Burn of second degree of right thigh, initial encounter: Secondary | ICD-10-CM | POA: Insufficient documentation

## 2018-05-08 DIAGNOSIS — T300 Burn of unspecified body region, unspecified degree: Secondary | ICD-10-CM

## 2018-05-08 DIAGNOSIS — G35 Multiple sclerosis: Secondary | ICD-10-CM | POA: Diagnosis not present

## 2018-05-08 DIAGNOSIS — T2122XA Burn of second degree of abdominal wall, initial encounter: Secondary | ICD-10-CM | POA: Diagnosis not present

## 2018-05-08 DIAGNOSIS — Y999 Unspecified external cause status: Secondary | ICD-10-CM | POA: Insufficient documentation

## 2018-05-08 DIAGNOSIS — Y92000 Kitchen of unspecified non-institutional (private) residence as  the place of occurrence of the external cause: Secondary | ICD-10-CM | POA: Insufficient documentation

## 2018-05-08 MED ORDER — SILVER SULFADIAZINE 1 % EX CREA
TOPICAL_CREAM | Freq: Once | CUTANEOUS | Status: AC
Start: 2018-05-08 — End: 2018-05-08
  Administered 2018-05-08: 1 via TOPICAL
  Filled 2018-05-08: qty 50

## 2018-05-08 NOTE — ED Triage Notes (Signed)
Pt spilled boiling water on her right leg and lower abdomen 3 days ago. Pt was wearing clothes while she was cooking. Pt is concerned because the skin is beginning to peel in the area. Pt has been using neosporin on the wound.

## 2018-05-08 NOTE — Discharge Instructions (Addendum)
Follow up with your doctor or return here in 2 days for recheck of the burn. Clean the wound with antibacterial soap and warm water, pat dry. Apply small amount of the antibiotic cream we give you and apply dressing. You will need to do this every 12 hours.

## 2018-05-08 NOTE — ED Provider Notes (Signed)
[facility] DEPT Provider Note   CSN: [id number] Arrival date & time: 05/08/18  1752     History   Chief Complaint Chief Complaint  Patient presents with  . Burn    HPI [name] is a 32 y.o. female who presents to the ED for a burn. Patient reports she was boiling noodles and the hot water spilled on her. The injury occurred 6 days ago. Initially she did nothing and thought it would just heal. The blisters have opened and the area is more painful. Patient reports being up to date on tetanus. Patient denies fever or other problems.   HPI  Past Medical History:  Diagnosis Date  . Abnormal Pap smear    Colpo>normal  . Gonorrhea   . Headache    last migraine 01/2017  . History of IBS    watch diet, no meds  . Multiple sclerosis ([employer] [place])   . Neuromuscular disorder [facility])    diagnosed with MS this visit  . Obesity   . Ovarian cyst   . Pernicious anemia 05/02/2013  . Preterm labor    all 3 deliveries  . SVD (spontaneous vaginal delivery)    x 3  . Urinary tract infection   . Vaginal Pap smear, abnormal     Patient Active Problem List   Diagnosis Date Noted  . Vaginal discharge 01/04/2018  . Cholecystitis, acute with cholelithiasis 11/25/2017  . Acute pain of left knee 10/04/2017  . Fibroid of cervix   . Cervical mass 12/23/2016  . Child behavior problem 03/27/2016  . Breast mass 02/09/2016  . Unspecified vitamin D deficiency 10/04/2013  . Malabsorption 08/29/2013  . 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 B12 deficiency 06/27/2013  . Insomnia 06/27/2013  . Adjustment disorder with mixed anxiety and depressed mood 03/13/2013  . Multiple sclerosis ([place]) 03/13/2013  . Abdominal pain 07/29/2007  . OBESITY, NOS 08/12/2006    Past Surgical History:  Procedure Laterality Date  . CHOLECYSTECTOMY N/A 11/25/2017   Procedure: LAPAROSCOPIC CHOLECYSTECTOMY WITH INTRAOPERATIVE CHOLANGIOGRAM;  Surgeon: Armandina Gemma, MD;   Location: [facility];  Service: General;  Laterality: N/A;  . COLPOSCOPY    . MYOMECTOMY N/A 04/01/2017   Procedure: Vaginal Myomectomy;  Surgeon: Osborne Oman, MD;  Location: [facility];  Service: Gynecology;  Laterality: N/A;  . TUBAL LIGATION     2012  . WISDOM TOOTH EXTRACTION       OB History    Gravida  6   Para  3   Term  1   Preterm  2   AB  3   Living  4     SAB      TAB  3   Ectopic      Multiple  1   Live Births  2            Home Medications    Prior to Admission medications   Medication Sig Start Date End Date Taking? Authorizing Provider  cyanocobalamin (,VITAMIN B-12,) 1000 MCG/ML injection Inject 1 mL (1,000 mcg total) into the muscle every 30 (thirty) days. Continue for 3 months, then stop and return for labs. 10/25/17   Rogue Bussing, MD  ibuprofen (ADVIL,MOTRIN) 800 MG tablet Take 0.5 tablets (400 mg total) by mouth every 8 (eight) hours as needed for mild pain. 11/26/17   Jill Alexanders, PA-C  metroNIDAZOLE (FLAGYL) 500 MG tablet Take 1 tablet (500 mg total) by mouth 2 (two) times daily. 01/04/18  Rory Percy, DO  ocrelizumab (OCREVUS) 300 MG/10ML injection Inject 600 mg into the vein every 6 (six) months.    [provider]    Family History Family History  Problem Relation Age of Onset  . Diabetes Mother   . Hypertension Mother   . Diabetes Father   . Hypertension Father   . Anesthesia problems Neg Hx   . Other Neg Hx     Social History Social History   Tobacco Use  . Smoking status: Never Smoker  . Smokeless tobacco: Never Used  Substance Use Topics  . Alcohol use: No    Alcohol/week: 0.0 standard drinks    Comment:    . Drug use: No     Allergies   Patient has no known allergies.   Review of Systems Review of Systems  Skin: Positive for color change and wound.  All other systems reviewed and are negative.    Physical Exam Updated Vital Signs BP 118/66 (BP Location: Left Arm)   Pulse  85   Temp 98.1 F (36.7 C) (Oral)   Resp 18   LMP 04/15/2018   SpO2 97%   Physical Exam  Constitutional: She appears well-developed and well-nourished. No distress.  HENT:  Head: Normocephalic.  Eyes: EOM are normal.  Neck: Neck supple.  Cardiovascular: Normal rate.  Pulmonary/Chest: Effort normal.  Musculoskeletal:       Legs: Neurological: She is alert.  Skin:  Healing burn to the right lower abdomen and right upper thigh. Blisters have ruptured and skin is healing. There is no red streaking.   Psychiatric: She has a normal mood and affect. Her behavior is normal.  Nursing note and vitals reviewed.    ED Treatments / Results  Labs (all labs ordered are listed, but only abnormal results are displayed) Labs Reviewed - No data to display  Radiology No results found.  Procedures wound cleaned, silvadene burn dressing applied. Patient will clean burn and change dressing every 12 hours and return in 2 days for recheck. She will return sooner for any problems. Stable for d/c without red streaking or fever.   Procedures (including critical care time)  Medications Ordered in ED Medications  silver sulfADIAZINE (SILVADENE) 1 % cream (1 application Topical Given 05/08/18 1844)     Initial Impression / Assessment and Plan / ED Course  I have reviewed the triage vital signs and the nursing notes.  Final Clinical Impressions(s) / ED Diagnoses   Final diagnoses:  Burn    ED Discharge Orders    None       [name] [place], [place] 05/08/18 2037    [name], Meghan Allegra, MD 05/08/18 [phone]

## 2018-05-09 ENCOUNTER — Other Ambulatory Visit: Payer: Self-pay

## 2018-05-09 ENCOUNTER — Encounter (HOSPITAL_COMMUNITY): Payer: Self-pay

## 2018-05-09 ENCOUNTER — Inpatient Hospital Stay (HOSPITAL_COMMUNITY)
Admission: AD | Admit: 2018-05-09 | Discharge: 2018-05-09 | Disposition: A | Discharge status: Home / Self Care | Payer: Medicare Other | Source: Ambulatory Visit | Attending: Obstetrics and Gynecology | Admitting: Obstetrics and Gynecology

## 2018-05-09 DIAGNOSIS — Z833 Family history of diabetes mellitus: Secondary | ICD-10-CM | POA: Insufficient documentation

## 2018-05-09 DIAGNOSIS — Z9049 Acquired absence of other specified parts of digestive tract: Secondary | ICD-10-CM | POA: Insufficient documentation

## 2018-05-09 DIAGNOSIS — Z9889 Other specified postprocedural states: Secondary | ICD-10-CM | POA: Insufficient documentation

## 2018-05-09 DIAGNOSIS — E669 Obesity, unspecified: Secondary | ICD-10-CM | POA: Diagnosis not present

## 2018-05-09 DIAGNOSIS — R102 Pelvic and perineal pain unspecified side: Secondary | ICD-10-CM | POA: Diagnosis present

## 2018-05-09 LAB — URINALYSIS, ROUTINE W REFLEX MICROSCOPIC
BACTERIA UA: NONE SEEN
Bilirubin Urine: NEGATIVE
Glucose, UA: NEGATIVE mg/dL
KETONES UR: NEGATIVE mg/dL
LEUKOCYTES UA: NEGATIVE
NITRITE: NEGATIVE
PROTEIN: NEGATIVE mg/dL
Specific Gravity, Urine: 1.015 (ref 1.005–1.030)
pH: 5 (ref 5.0–8.0)

## 2018-05-09 LAB — WET PREP, GENITAL
CLUE CELLS WET PREP: NONE SEEN
Sperm: NONE SEEN
TRICH WET PREP: NONE SEEN
WBC, Wet Prep HPF POC: NONE SEEN
Yeast Wet Prep HPF POC: NONE SEEN

## 2018-05-09 LAB — POCT PREGNANCY, URINE: PREG TEST UR: NEGATIVE

## 2018-05-09 NOTE — MAU Provider Note (Signed)
History     CSN: [id number]  Arrival date and time: 05/09/18 1357   First Provider Initiated Contact with Patient 05/09/18 1449      Chief Complaint  Patient presents with  . Vaginal Pain   HPI  Ms.  [name] is a 32 y.o. year old [phone] female who presents to MAU reporting deep internal vaginal "burning & cramping" x 3 days. She denies VB. She reports that she had this same type of pain before she was dx'd with fibroids. She had fibroids surgically removed early in 2019; which caused the vaginal pains to "go away." She is here today, because the vaginal pains returned 3 days ago and are not getting any better. She take ibuprofen 800 mg "regularly"; which minimally helps the pain. She reports having 1 sexual partner x 6 yrs; last SI was 2-3 wks ago. She reports her vaginal d/c appears normal -- "almost clear", but would like STI testing today.   Past Medical History:  Diagnosis Date  . Abnormal Pap smear    Colpo>normal  . Gonorrhea   . Headache    last migraine 01/2017  . History of IBS    watch diet, no meds  . Multiple sclerosis ([place])   . Neuromuscular disorder [facility])    diagnosed with MS this visit  . Obesity   . Ovarian cyst   . Pernicious anemia 05/02/2013  . Preterm labor    all 3 deliveries  . SVD (spontaneous vaginal delivery)    x 3  . Urinary tract infection   . Vaginal Pap smear, abnormal     Past Surgical History:  Procedure Laterality Date  . CHOLECYSTECTOMY N/A 11/25/2017   Procedure: LAPAROSCOPIC CHOLECYSTECTOMY WITH INTRAOPERATIVE CHOLANGIOGRAM;  Surgeon: Armandina Gemma, MD;  Location: [facility];  Service: General;  Laterality: N/A;  . COLPOSCOPY    . MYOMECTOMY N/A 04/01/2017   Procedure: Vaginal Myomectomy;  Surgeon: Osborne Oman, MD;  Location: [facility];  Service: Gynecology;  Laterality: N/A;  . TUBAL LIGATION     2012  . WISDOM TOOTH EXTRACTION      Family History  Problem Relation Age of Onset  . Diabetes Mother   . Hypertension  Mother   . Diabetes Father   . Hypertension Father   . Anesthesia problems Neg Hx   . Other Neg Hx     Social History   Tobacco Use  . Smoking status: Never Smoker  . Smokeless tobacco: Never Used  Substance Use Topics  . Alcohol use: No    Alcohol/week: 0.0 standard drinks    Comment:    . Drug use: No    Allergies: No Known Allergies  Medications Prior to Admission  Medication Sig Dispense Refill Last Dose  . ibuprofen (ADVIL,MOTRIN) 800 MG tablet Take 0.5 tablets (400 mg total) by mouth every 8 (eight) hours as needed for mild pain. 30 tablet 0 Past Week at Unknown time  . ocrelizumab (OCREVUS) 300 MG/10ML injection Inject 600 mg into the vein every 6 (six) months.   Past Month at Unknown time  . cyanocobalamin (,VITAMIN B-12,) 1000 MCG/ML injection Inject 1 mL (1,000 mcg total) into the muscle every 30 (thirty) days. Continue for 3 months, then stop and return for labs. 3 mL 0 More than a month at Unknown time  . metroNIDAZOLE (FLAGYL) 500 MG tablet Take 1 tablet (500 mg total) by mouth 2 (two) times daily. 14 tablet 0 More than a month at Unknown time  Review of Systems  Constitutional: Negative.   HENT: Negative.   Eyes: Negative.   Respiratory: Negative.   Cardiovascular: Negative.   Endocrine: Negative.   Genitourinary: Positive for vaginal pain (deep inside since before fibroids, now returning since fibroids being surgically removed).  Musculoskeletal: Negative.   Skin: Negative.   Allergic/Immunologic: Negative.   Neurological: Negative.   Hematological: Negative.   Psychiatric/Behavioral: Negative.    Physical Exam   Blood pressure (!) 140/93, pulse 72, temperature 98.3 F (36.8 C), temperature source Oral, resp. rate 17, weight 111.1 kg, last menstrual period 04/15/2018, SpO2 98 %.  Physical Exam  Nursing note and vitals reviewed. Constitutional: She is oriented to person, place, and time. She appears well-developed and well-nourished.  HENT:  Head:  Normocephalic and atraumatic.  Eyes: Pupils are equal, round, and reactive to light.  Neck: Normal range of motion.  Cardiovascular: Normal rate.  Respiratory: Effort normal.  GI: Soft.  Genitourinary:  Genitourinary Comments: Uterus: non-tender, SE: cervix is smooth, pink, no lesions, moderate amt of egg white like clear vaginal d/c c/w ovulatory phase -- WP, GC/CT done, closed/long/firm, no CMT or friability, no adnexal tenderness   Musculoskeletal: Normal range of motion.  Neurological: She is alert and oriented to person, place, and time.  Skin: Skin is warm and dry.  Psychiatric: She has a normal mood and affect. Her behavior is normal. Judgment and thought content normal.    MAU Course  Procedures  MDM CCUA UPT Wet Prep GC/CT -- pending  Results for orders placed or performed during the hospital encounter of 05/09/18 (from the past 24 hour(s))  Urinalysis, Routine w reflex microscopic     Status: Abnormal   Collection Time: 05/09/18  2:13 PM  Result Value Ref Range   Color, Urine YELLOW YELLOW   APPearance CLEAR CLEAR   Specific Gravity, Urine 1.015 1.005 - 1.030   pH 5.0 5.0 - 8.0   Glucose, UA NEGATIVE NEGATIVE mg/dL   Hgb urine dipstick LARGE (A) NEGATIVE   Bilirubin Urine NEGATIVE NEGATIVE   Ketones, ur NEGATIVE NEGATIVE mg/dL   Protein, ur NEGATIVE NEGATIVE mg/dL   Nitrite NEGATIVE NEGATIVE   Leukocytes, UA NEGATIVE NEGATIVE   RBC / HPF 6-10 0 - 5 RBC/hpf   WBC, UA 0-5 0 - 5 WBC/hpf   Bacteria, UA NONE SEEN NONE SEEN   Squamous Epithelial / LPF 0-5 0 - 5   Mucus PRESENT   Pregnancy, urine POC     Status: None   Collection Time: 05/09/18  2:30 PM  Result Value Ref Range   Preg Test, Ur NEGATIVE NEGATIVE  Wet prep, genital     Status: None   Collection Time: 05/09/18  3:10 PM  Result Value Ref Range   Yeast Wet Prep HPF POC NONE SEEN NONE SEEN   Trich, Wet Prep NONE SEEN NONE SEEN   Clue Cells Wet Prep HPF POC NONE SEEN NONE SEEN   WBC, Wet Prep HPF POC  NONE SEEN NONE SEEN   Sperm NONE SEEN      Assessment and Plan  Vaginal pain - Plan: Discharge patient - Advised there is not much that can be done for her sx's in an emergent setting  - In the light of negative testing today, advised to F/U with MD in [place] prn // msg sent to [facility] to get patient scheduled - Patient verbalized an understanding of the plan of care and agrees.   Laury Deep, MSN, CNM 05/09/2018, 2:49 PM

## 2018-05-09 NOTE — MAU Note (Signed)
Vaginal pain for 3 days

## 2018-05-09 NOTE — Discharge Instructions (Signed)
In late February 2020, the [facility] will be moving to the [facility] At that time, the MAU (Maternity Admissions Unit), where you are being seen today, will no longer take care of non-pregnant patients. We strongly encourage you to find a doctor's office before that time, so that you can be seen with any GYN concerns, like vaginal discharge, urinary tract infection, etc.. in a timely manner.  In order to make an office visit more convenient, the Center for [place] at [facility] will be offering evening hours with same-day appointments, walk-in appointments and scheduled appointments available during this time.  Center for [facility] @ [facility] Hours: Monday - 8am - 7:30 pm with walk-in between 4pm- 7:30 pm Tuesday - 8 am - 5 pm open late and accepting walk-ins from 4pm - 7:30pm) Wednesday - 8 am - 5 pm open late and accepting walk-ins from 4pm - 7:30pm) Thursday 8 am - 5 pm open late and accepting walk-ins from 4pm - 7:30pm) Friday 8 am - 12 pm  For an appointment please call the Center for [place] @ [facility] at [phone]  For urgent needs, [facility] is also available for management of urgent GYN complaints such as vaginal discharge or urinary tract infections.

## 2018-05-09 NOTE — MAU Note (Signed)
Having burning and cramping in vag area.  Started 3 days ago.  Denies bleeding, ? D/c- appears normal

## 2018-05-10 LAB — GC/CHLAMYDIA PROBE AMP (~~LOC~~) NOT AT ARMC
Chlamydia: NEGATIVE
Neisseria Gonorrhea: NEGATIVE

## 2018-05-22 NOTE — Progress Notes (Signed)
  Subjective:   Patient ID: [name]    DOB: 06-Aug-1985, 32 y.o. female   MRN: [id number]  [name] is a 32 y.o. female with a history of MS, obesity, adjustment d/o, B12 deficiency here for   Abdominal Pain Patient reports longstanding history of lower abdominal pain for years.  States that she has had her gallbladder removal and cervical fibroid removal with improvement in gaseous abdominal pain and dyspareunia respectively, however still has persistent crampy and burning lower abdominal pain.  States sometimes her pain feels "hot." Additional abdominal surgery includes BTL in 2012.  She is concerned that the clamps on her tubes is causing her issues.  She has MS and was told by her neurologist that certain metals can flare autoimmune issues.  Was previously told she had IBS, however denies changes or difficulties in bowel habits.  Denies urinary symptoms or blood in urine or stool.  States abdominal pain is worse with menses.  She has irregular menses, unsure of LMP the had 2 cycles in 04/2018.  Has had 1 sexual partner for the last 7 years without concern for STIs.  Normal Pap smear 01/2017.  Went to MAU 05/09/2018 for similar pain with negative wet prep and STI testing, vaginal exam normal at that time.  Denies any known family or personal history of endometriosis.  She has tried 800 mg Motrin for the pain which does not help.  Currently not on contraception given BTL.  Review of Systems:  Per HPI.  [place], medications and smoking status reviewed.  Objective:   BP 122/80 (BP Location: Right Arm, Patient Position: Sitting, Cuff Size: Large)   Pulse 82   Temp 98.3 F (36.8 C) (Oral)   Ht 5\' 4"  (1.626 m)   Wt 246 lb (111.6 kg)   LMP  (Within Weeks) Comment: had period 04/2018; denies pregnancy  SpO2 99%   BMI 42.23 kg/m  Vitals and nursing note reviewed.  General: Obese female, in no acute distress with non-toxic appearance CV: regular rate and rhythm without murmurs, rubs, or  gallops Lungs: clear to auscultation bilaterally with normal work of breathing Abdomen: soft, tender to palpation in lower quadrants L>R, obese abdomen, no masses or organomegaly palpable, normoactive bowel sounds, no rebound or guarding noted. MSK: Strength intact, gait normal Neuro: Alert and oriented, speech normal  Assessment & Plan:   Endometriosis Longstanding lower abdominal pain with increase in pain with menses likely related to endometriosis.  Deferred vaginal exam today given recent normal vaginal exam and patient preference.  Patient desires surgical consultation for possible laparoscopic removal of implants, referral made to OB/GYN.  Reviewed possible utility of OCPs or IUD for regulation of periods and pain control, patient declines at this time.  Could also consider initiation of metformin given obesity and likely contribution of PCOS.    Orders Placed This Encounter  Procedures  . Flu Vaccine QUAD 36+ mos IM  . Ambulatory referral to Obstetrics / Gynecology    Referral Priority:   Routine    Referral Type:   Consultation    Referral Reason:   Specialty Services Required    Requested Specialty:   Obstetrics and Gynecology    Number of Visits Requested:   1   No orders of the defined types were placed in this encounter.   Rory Percy, DO PGY-2, [facility] 05/23/2018 5:11 PM

## 2018-05-23 ENCOUNTER — Encounter: Payer: Self-pay | Admitting: Family Medicine

## 2018-05-23 ENCOUNTER — Ambulatory Visit (INDEPENDENT_AMBULATORY_CARE_PROVIDER_SITE_OTHER): Payer: Medicare Other | Admitting: Family Medicine

## 2018-05-23 VITALS — BP 122/80 | HR 82 | Temp 98.3°F | Ht 64.0 in | Wt 246.0 lb

## 2018-05-23 DIAGNOSIS — N809 Endometriosis, unspecified: Secondary | ICD-10-CM | POA: Diagnosis present

## 2018-05-23 DIAGNOSIS — Z23 Encounter for immunization: Secondary | ICD-10-CM | POA: Diagnosis not present

## 2018-05-23 NOTE — Assessment & Plan Note (Addendum)
Longstanding lower abdominal pain with increase in pain with menses likely related to endometriosis.  Deferred vaginal exam today given recent normal vaginal exam and patient preference.  Patient desires surgical consultation for possible laparoscopic removal of implants, referral made to OB/GYN.  Reviewed possible utility of OCPs or IUD for regulation of periods and pain control, patient declines at this time.  Could also consider initiation of metformin given obesity and likely contribution of PCOS.

## 2018-07-06 ENCOUNTER — Ambulatory Visit: Payer: Medicare Other | Admitting: Obstetrics & Gynecology

## 2018-08-01 ENCOUNTER — Encounter: Payer: Self-pay | Admitting: *Deleted

## 2018-08-08 IMAGING — US US ABDOMEN LIMITED
1 series · 15 of 25 positions shown · non-contrast
Comparison: None.

CLINICAL DATA: Epigastric pain

EXAM:
ULTRASOUND ABDOMEN LIMITED RIGHT UPPER QUADRANT

[Series 1: us abdomen limited · 15 of 57 slices shown]
[im 1/57]
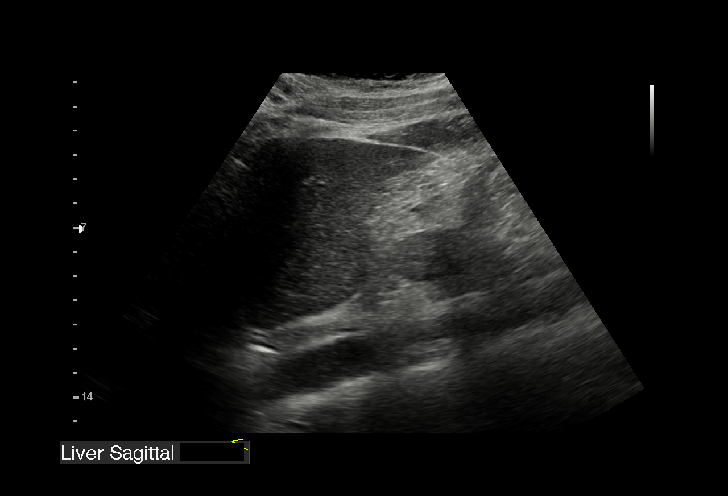
[im 5/57]
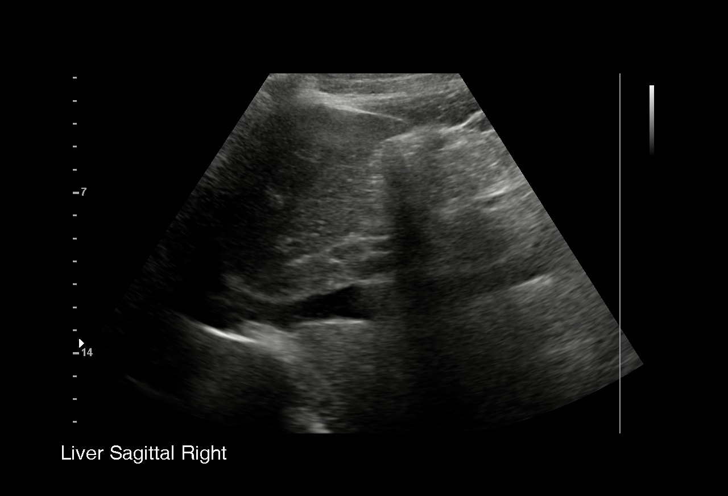
[im 10/57]
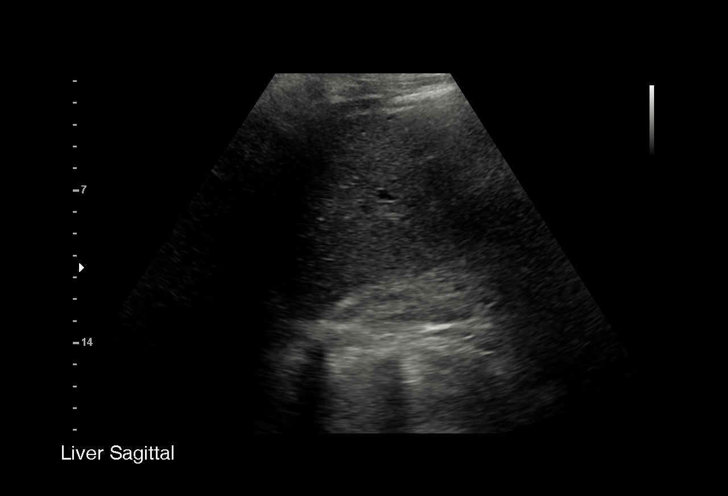
[im 12/57]
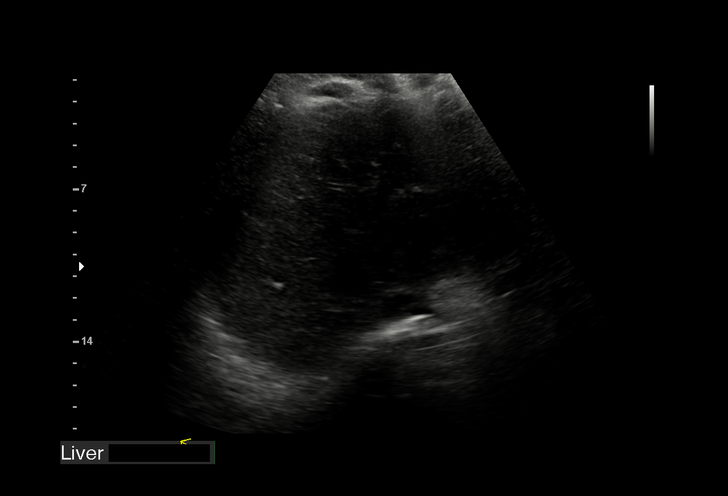
[im 17/57]
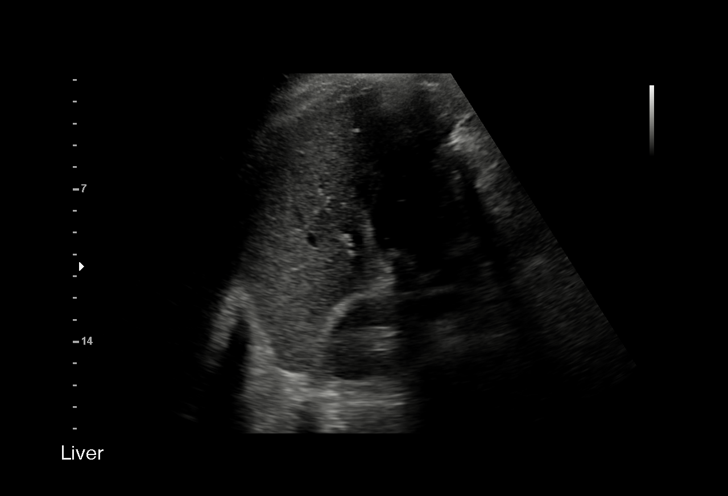
[im 22/57]
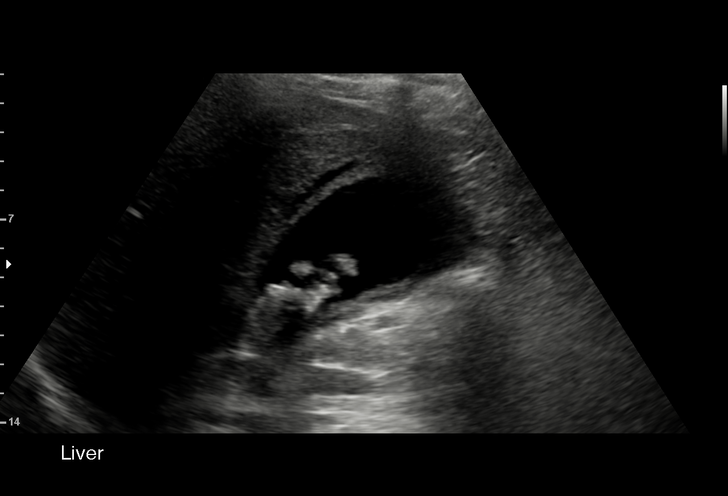
[im 24/57]
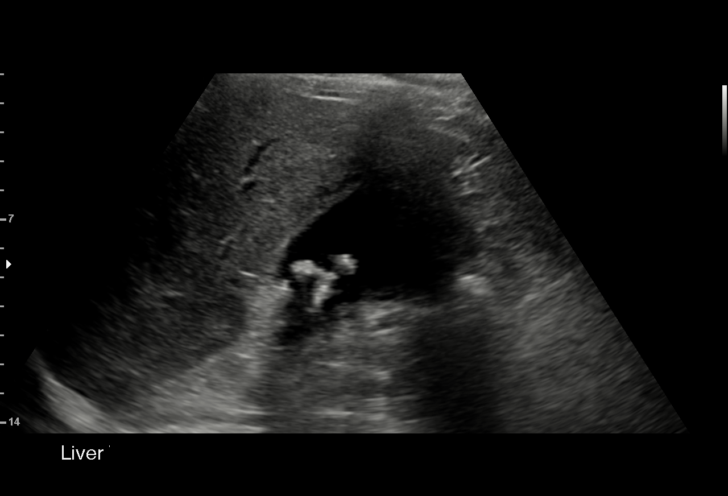
[im 29/57]
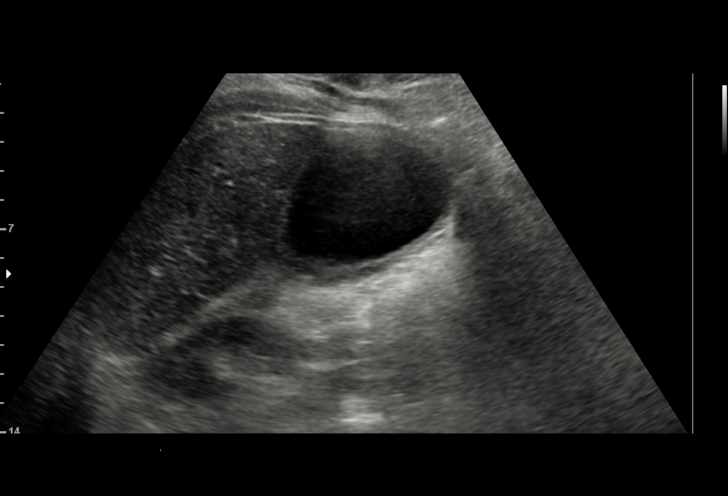
[im 33/57]
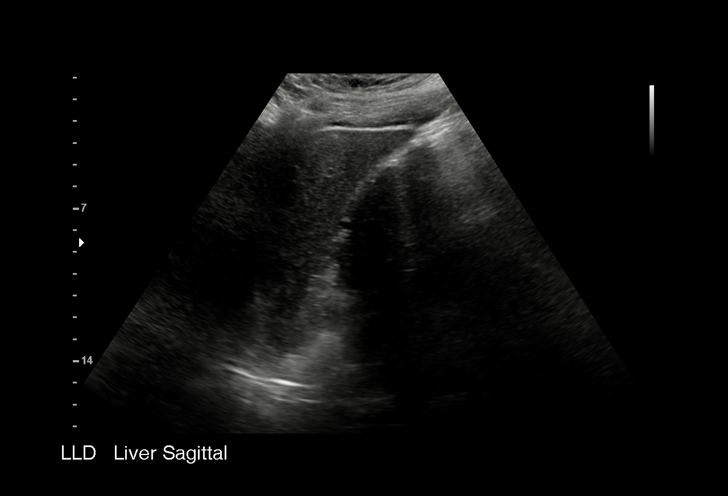
[im 36/57]
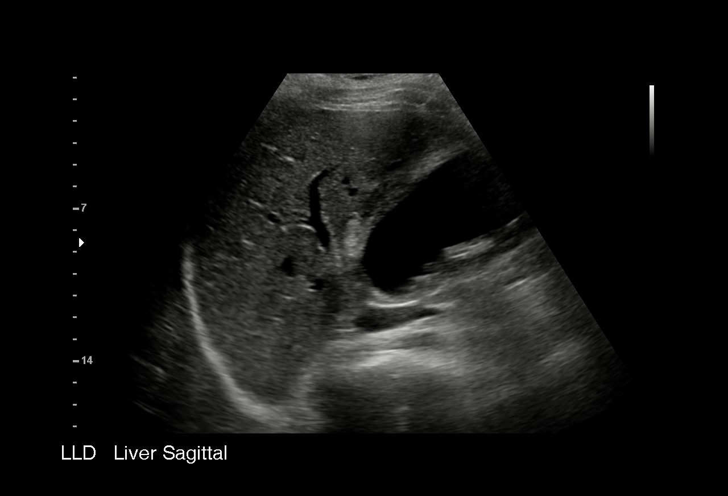
[im 40/57]
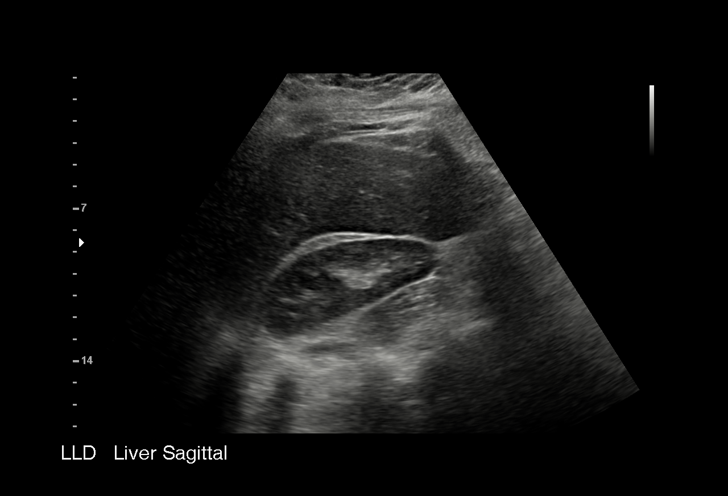
[im 45/57]
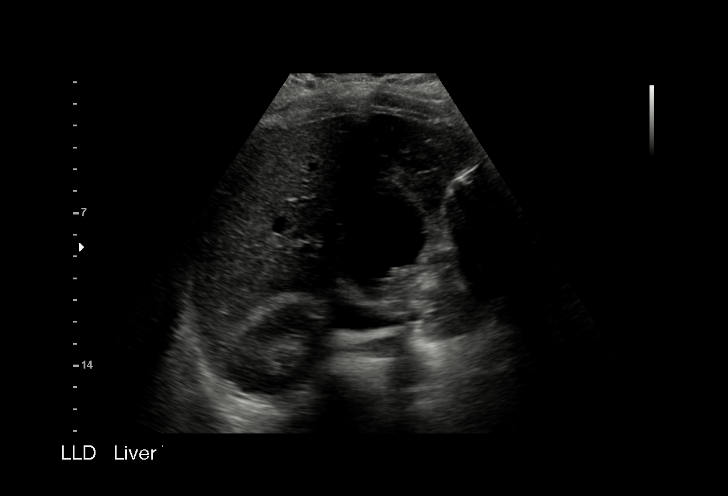
[im 47/57]
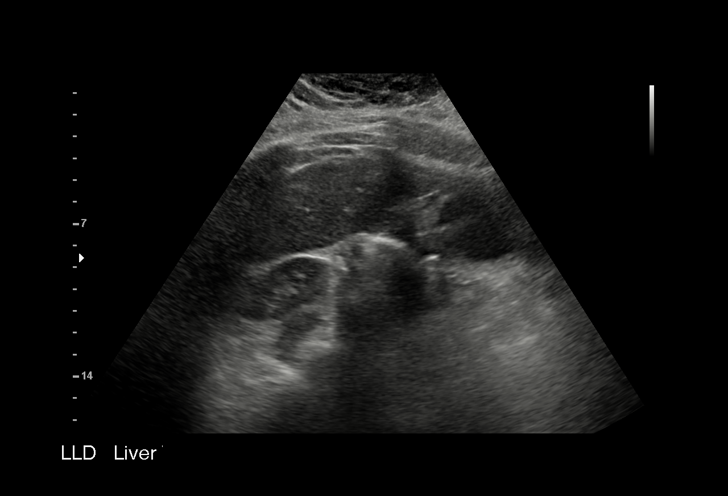
[im 52/57]
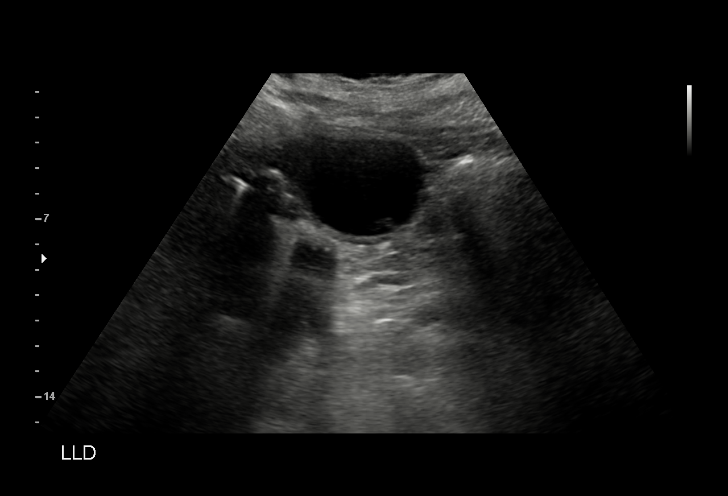
[im 57/57]
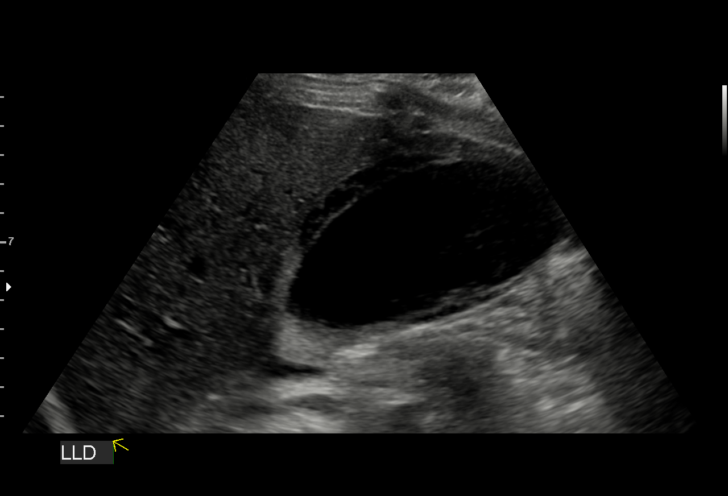

[15 of 25 positions shown; findings below may reference images not displayed]

FINDINGS: Gallbladder:

Multiple stones are identified within the gallbladder measuring up
to 1.2 cm. Negative sonographic Murphy's sign. Areas of mild
gallbladder wall thickening measuring up to 4 mm noted. There is a
small amount of pericholecystic fluid.

Common bile duct:

Diameter: 6 mm.

Liver:

No focal lesion identified. Within normal limits in parenchymal
echogenicity. Portal vein is patent on color Doppler imaging with
normal direction of blood flow towards the liver.
IMPRESSION: 1. Gallstone, pericholecystic fluid and mild gallbladder wall
thickening. Suspicious for acute cholecystitis. If further imaging
is clinically indicated consider nuclear medicine hepatic biliary to
assess patency of the cystic duct.

## 2018-08-17 ENCOUNTER — Ambulatory Visit (INDEPENDENT_AMBULATORY_CARE_PROVIDER_SITE_OTHER): Payer: Medicare HMO | Admitting: Obstetrics & Gynecology

## 2018-08-17 ENCOUNTER — Encounter: Payer: Self-pay | Admitting: Obstetrics & Gynecology

## 2018-08-17 VITALS — BP 126/87 | HR 95 | Wt 238.0 lb

## 2018-08-17 DIAGNOSIS — R102 Pelvic and perineal pain: Secondary | ICD-10-CM | POA: Diagnosis not present

## 2018-08-17 NOTE — Progress Notes (Signed)
   Subjective:    Patient ID: [name], female    DOB: [birth year], 33 y.o.   MRN: [id number]  HPI  33 yo engaged P4 (37, 60, and 35 yo twins) referred here from [place] for cramping. The cramps have resolved since them. She has also had some feelings of being bloated and gassy, reports daily BMs.  She has had ultrasounds with her RE recently. She has another appt with him on 09-06-18. This appt here was made prior to her decision to work with the RE.   Review of Systems She has had a BTL. She is getting a BTL reversal in [place] next month.   She had a normal pap in 2018.  She has had a lap chole in 2019. She has MS and doesn't work. Periods are monthly, varies when it comes.   Objective:   Physical Exam Breathing, conversing, and ambulating normally Well nourished, well hydrated Black female, no apparent distress Abd- benign, obese     Assessment & Plan:  Abdominopelvic discomfort with bloating- now better I won't order gyn u/s today since she will be seeing the reproductive endocrinologist this month and she will get an [place] then.

## 2018-11-23 ENCOUNTER — Telehealth: Payer: Self-pay

## 2018-11-23 NOTE — Telephone Encounter (Signed)
The patient called in for assistance with making an appointment for a reverse tubal. She had the procedure done in [place], [place]. Informed the patient of Dr. Kerin Perna information.

## 2018-11-28 ENCOUNTER — Ambulatory Visit (INDEPENDENT_AMBULATORY_CARE_PROVIDER_SITE_OTHER): Payer: Medicare HMO | Admitting: Family Medicine

## 2018-11-28 ENCOUNTER — Encounter: Payer: Self-pay | Admitting: Family Medicine

## 2018-11-28 ENCOUNTER — Other Ambulatory Visit: Payer: Self-pay

## 2018-11-28 VITALS — BP 110/75 | HR 65 | Temp 98.4°F | Wt 236.0 lb

## 2018-11-28 DIAGNOSIS — F419 Anxiety disorder, unspecified: Secondary | ICD-10-CM | POA: Insufficient documentation

## 2018-11-28 DIAGNOSIS — R1084 Generalized abdominal pain: Secondary | ICD-10-CM | POA: Diagnosis not present

## 2018-11-28 MED ORDER — SERTRALINE HCL 25 MG PO TABS: 25.0000 mg | ORAL_TABLET | Freq: Every day | ORAL | 0 refills | Status: DC

## 2018-11-28 NOTE — Assessment & Plan Note (Signed)
GAD elevated today, 19.  Suspect this is contributing to abdominal gas and bloating. Most current stressor identified is COVID outbreak.  After much discussion and shared decision making, patient elects to start medication as well as return to counseling, information provided.  Discussed coping mechanisms, see AVS. follow-up in 1 month.

## 2018-11-28 NOTE — Assessment & Plan Note (Signed)
Chronic.  Has questionable history of IBS and anxiety likely also contributing, GAD elevated today.  Also with history of MS which could be a complication, has follow-up with neurology in September.  No red flags on history or exam to warrant urgent GI referral or imaging.  Counseled on conservative measures including looking for triggering foods or exposures as well as controlling her anxiety, see separate problem list item.

## 2018-11-28 NOTE — Patient Instructions (Addendum)
It was great to see you!  Our plans for today:  - We are starting a medication for your anxiety. See below for suggestions for therapists in the area and apps you can download to your phone that can help calm you. - Make sure to eat a balanced diet with lots of fruits and vegetables. - Follow up in one month. Come back to see [place] if you develop nausea, vomiting, or inability to tolerate food.   Take care and seek immediate care sooner if you develop any concerns.   Dr. [facility]  Psychiatry and [place], [place] [phone]  Psychiatrists  [facility] [address] [place] [address], [place], [place] [address] [place], [place] [address] [phone]  Dr. Norma Fredrickson [facility] [address] [place] [address] [place] [address] [phone]  Sheralyn Boatman, [employer] [address] [place] [address] [place] [address] [phone]  Therapists  [facility] [address] [place], [place] [phone]  [facility] [address] [place] [address] [place], [place] [phone]  [facility] [address] [place], [place] [address] [place], [place] [address] [phone]  [facility] [address] [place], [place] [address] [place], [place] [phone]  These referrals have been provided to you as appropriate for your  clinical needs while taking into account your financial concerns. Please be aware that agencies, practitioners and insurance companies sometimes change contracts. When calling to make an appointment have your insurance information available so the professional you are going to see can confirm whether they are covered by your plan. Take this form with you in case the person you are seeing needs a copy or to contact us.   Mental Health Apps and Websites Here are a few free apps meant to help you to help yourself.  To find, try searching on the internet to see if the app is offered on Apple/Android devices. If your first choice doesn't come up on your device, the good news is that there are many choices! Play around with different apps to see which ones are helpful to you . Calm This is an app meant to help increase calm feelings. Includes info, strategies, and tools for tracking your feelings.   Calm Harm  This app is meant to help with self-harm. Provides many 5-minute or 15-min coping strategies for doing instead of hurting yourself.    [place] is a problem-solving tool to help deal with emotions and cope with stress you encounter wherever you are.    MindShift This app can help people cope with anxiety. Rather than trying to avoid anxiety, you can make an important shift and face it.    MY3  MY3 features a support system, safety plan and resources with the goal of offering a tool to use in a time of need.    My Life My Voice  This mood journal offers a simple solution for tracking your thoughts, feelings and moods. Animated emoticons can help identify your mood.  Relax Melodies Designed to help with sleep, on this app you can mix sounds and meditations for relaxation.    Smiling Mind Smiling Mind is meditation made easy: it's a simple tool that helps put a smile on your mind.    Stop, Breathe & Think  A friendly, simple guide for people through meditations for  mindfulness and compassion.  Stop, Breathe and Think Kids Enter your current feelings and choose a "mission" to help you cope. Offers videos for certain moods instead of just sound recordings.     The [place] Box The [place] Box (VHB) contains simple tools to help patients with coping, relaxation, distraction, and positive thinking.

## 2018-11-28 NOTE — Progress Notes (Signed)
Subjective:   Patient ID: [name]    DOB: 01-21-1986, 33 y.o. female   MRN: [id number]  ALLIENE [name] is a 33 y.o. female with a history of MS, obesity, adjustment d/o, B12 deficiency here for   Gas - Patient endorses gas and bloating located mainly in the bottom of her stomach ongoing for the past year.   - She thought this was due to her tubal ligation and had a reversal 2 weeks ago in [place] and hopes to soon get pregnant.  She has not been sexually active since her surgery.   - Has previously seen gastroenterology but this is been multiple years ago, was told she had IBS.   - Previously had cholecystectomy which helped her symptoms for a little while.   - Also has history of MS and was told her symptoms could be due to this.  Follows up with neurologist in September.   - She denies nausea, vomiting, diarrhea, fevers, blood in her stool.  Does endorse some constipation although states she usually has 1 soft bowel movement per day, type IV on the [place] stool chart.  Denies any abnormal vaginal discharge or bleeding.  Endorses some dyspareunia. - She takes over-the-counter gas pills which help some.    -Has a history of acid reflux for which she takes omeprazole.  Does not notice any particular foods that worsen her symptoms but thinks spicy foods may be contributing. -Has a history of anxiety which has recently gotten worse given the current COVID outbreak.  She previously saw a therapist but is not recently.  She does not have any identified coping mechanisms but does like music.  Review of Systems:  Per HPI.  [place], medications and smoking status reviewed.  Objective:   BP 110/75   Pulse 65   Temp 98.4 F (36.9 C) (Oral)   Wt 236 lb (107 kg)   LMP 11/17/2018   SpO2 99%   BMI 40.51 kg/m  Vitals and nursing note reviewed.  General: Obese female, in no acute distress with non-toxic appearance CV: regular rate and rhythm without murmurs, rubs, or gallops Lungs: clear  to auscultation bilaterally with normal work of breathing Abdomen: soft, non-tender, obese abdomen, no masses or organomegaly palpable, normoactive bowel sounds Skin: warm, dry, no rashes or lesions Extremities: warm and well perfused, normal tone MSK: ROM grossly intact, strength intact, gait normal Neuro: Alert and oriented, speech normal  GAD 7 : Generalized Anxiety Score 11/28/2018 04/28/2017 01/21/2017  Nervous, Anxious, on Edge 3 0 0  Control/stop worrying 3 0 0  Worry too much - different things 3 2 0  Trouble relaxing 3 2 3   Restless 3 2 0  Easily annoyed or irritable 3 2 0  Afraid - awful might happen 1 0 0  Total GAD 7 Score 19 8 3   Anxiety Difficulty Very difficult - -    Assessment & Plan:   Abdominal pain Chronic.  Has questionable history of IBS and anxiety likely also contributing, GAD elevated today.  Also with history of MS which could be a complication, has follow-up with neurology in September.  No red flags on history or exam to warrant urgent GI referral or imaging.  Counseled on conservative measures including looking for triggering foods or exposures as well as controlling her anxiety, see separate problem list item.  Anxiety GAD elevated today, 19.  Suspect this is contributing to abdominal gas and bloating. Most current stressor identified is COVID outbreak.  After much discussion  and shared decision making, patient elects to start medication as well as return to counseling, information provided.  Discussed coping mechanisms, see AVS. follow-up in 1 month.  No orders of the defined types were placed in this encounter.  Meds ordered this encounter  Medications  . sertraline (ZOLOFT) 25 MG tablet    Sig: Take 1 tablet (25 mg total) by mouth daily.    Dispense:  30 tablet    Refill:  0    Rory Percy, DO PGY-2, [facility] 11/28/2018 4:32 PM

## 2018-12-05 ENCOUNTER — Ambulatory Visit: Payer: Medicare HMO | Admitting: Family Medicine

## 2018-12-26 ENCOUNTER — Other Ambulatory Visit: Payer: Self-pay

## 2018-12-26 ENCOUNTER — Inpatient Hospital Stay (HOSPITAL_COMMUNITY)
Admission: AD | Admit: 2018-12-26 | Discharge: 2018-12-26 | Disposition: A | Discharge status: Home / Self Care | Payer: Medicare HMO | Attending: Obstetrics and Gynecology | Admitting: Obstetrics and Gynecology

## 2018-12-26 DIAGNOSIS — N926 Irregular menstruation, unspecified: Secondary | ICD-10-CM | POA: Diagnosis not present

## 2018-12-26 DIAGNOSIS — Z3202 Encounter for pregnancy test, result negative: Secondary | ICD-10-CM

## 2018-12-26 DIAGNOSIS — O021 Missed abortion: Secondary | ICD-10-CM | POA: Diagnosis present

## 2018-12-26 LAB — URINALYSIS, ROUTINE W REFLEX MICROSCOPIC
Bilirubin Urine: NEGATIVE
Glucose, UA: NEGATIVE mg/dL
Ketones, ur: NEGATIVE mg/dL
Leukocytes,Ua: NEGATIVE
Nitrite: NEGATIVE
Protein, ur: NEGATIVE mg/dL
Specific Gravity, Urine: 1.018 (ref 1.005–1.030)
pH: 5 (ref 5.0–8.0)

## 2018-12-26 LAB — POCT PREGNANCY, URINE: Preg Test, Ur: NEGATIVE

## 2018-12-26 NOTE — MAU Note (Signed)
June 3 had a tubal reversal.  Missed her period.  Then she started having low cramping 3 days ago. Jac [place] d/c started 3 days ago, today it is pinkish. Neg HPT yesterday.

## 2018-12-26 NOTE — MAU Provider Note (Signed)
First Provider Initiated Contact with Patient 12/26/18 1351      S [name] is a 33 y.o. [phone] non-pregnant female who presents to MAU today with complaint of missed period, and brown vaginal discharge. Had tubal reversal and is concerned about pregnancy.    O BP 136/87 (BP Location: Right Arm)   Pulse 73   Temp 98.3 F (36.8 C) (Oral)   Resp 18   Ht 5\' 4"  (1.626 m)   Wt 107.5 kg   LMP 11/17/2018   SpO2 100%   BMI 40.70 kg/m  Physical Exam  A Non pregnant female Medical screening exam complete Missed Menses vs. Abnormal menstrual cycle.   P Discharge from MAU in stable condition Patient given the option of transfer to [facility] for further evaluation or seek care in outpatient facility of choice List of options for follow-up given  Warning signs for worsening condition that would warrant emergency follow-up discussed Patient may return to MAU as needed for pregnancy related complaints  Jozy Mcphearson, Artist Pais, NP 12/26/2018 1:58 PM

## 2018-12-28 ENCOUNTER — Other Ambulatory Visit: Payer: Self-pay | Admitting: Family Medicine

## 2018-12-28 NOTE — Telephone Encounter (Signed)
Please help patient make appt to f/u for her anxiety. Refill provided.

## 2019-01-13 ENCOUNTER — Encounter: Payer: Self-pay | Admitting: *Deleted

## 2019-01-27 ENCOUNTER — Other Ambulatory Visit: Payer: Self-pay | Admitting: Family Medicine

## 2019-01-27 NOTE — Telephone Encounter (Signed)
Will refill med once patient makes appointment. Please let patient know and help her make appointment. Thanks!!

## 2019-01-27 NOTE — Telephone Encounter (Signed)
Spoke with pt. Made appt for 02/13/2019 at 9:30. Salvatore Marvel, CMA

## 2019-02-09 ENCOUNTER — Telehealth: Payer: Self-pay | Admitting: Obstetrics & Gynecology

## 2019-02-09 NOTE — Telephone Encounter (Signed)
Spoke to patient about her appointment on 8/28 @ 10:35. Patient instructed to wear a face mask for the entire appointment and no visitors are allowed with her during the visit. Patient screened for covid symptoms and denied having any

## 2019-02-10 ENCOUNTER — Encounter: Payer: Self-pay | Admitting: Obstetrics & Gynecology

## 2019-02-10 ENCOUNTER — Ambulatory Visit: Payer: Medicare HMO | Admitting: Obstetrics and Gynecology

## 2019-02-10 ENCOUNTER — Other Ambulatory Visit: Payer: Self-pay

## 2019-02-10 ENCOUNTER — Ambulatory Visit (INDEPENDENT_AMBULATORY_CARE_PROVIDER_SITE_OTHER): Payer: Medicare HMO | Admitting: Obstetrics & Gynecology

## 2019-02-10 VITALS — BP 138/85 | HR 67 | Ht 64.0 in | Wt 236.0 lb

## 2019-02-10 DIAGNOSIS — N946 Dysmenorrhea, unspecified: Secondary | ICD-10-CM

## 2019-02-10 MED ORDER — IBUPROFEN 800 MG PO TABS: 800.0000 mg | ORAL_TABLET | Freq: Three times a day (TID) | ORAL | 1 refills | Status: DC | PRN

## 2019-02-10 NOTE — Progress Notes (Signed)
   Subjective:    Patient ID: [name], female    DOB: [birth year], 32 y.o.   MRN: [id number]  HPI 33 yo engaged P4 (25, 44 and 35yo twins) here today to discuss severe cramping. She has always had this severe cramping.  She has not had any pain with sex recently. Since I saw her last she had a tubal reversal (6/20 by a doctor in [place]). She says that the RE did not see any endometriosis with her surgery 2 months ago. She has a prescription for IBU and this helps some.  She has not started taking MVIs yet.  She did not have a period in July and did have a period this month from 50 to the 21st. It was light, lighter than ever in her past. She is having unprotected sex.  Review of Systems Pap normal 8/19    Objective:   Physical Exam Breathing, conversing, and ambulating normally Well nourished, well hydrated Black female, no apparent distress Abd- benign     Assessment & Plan:  Severe cramping- I will order a gyn u/s I have explained that treatment for cramping/dysmenorrhea is OCPs, orilissa, depo provera- all of which would either prevent pregnancy or be contraindicated. I will refill her IBU.  I rec'd that she start MVI now.

## 2019-02-13 ENCOUNTER — Ambulatory Visit (INDEPENDENT_AMBULATORY_CARE_PROVIDER_SITE_OTHER): Payer: Medicare HMO | Admitting: Family Medicine

## 2019-02-13 ENCOUNTER — Other Ambulatory Visit: Payer: Self-pay

## 2019-02-13 ENCOUNTER — Encounter: Payer: Self-pay | Admitting: Family Medicine

## 2019-02-13 VITALS — BP 118/78 | HR 74

## 2019-02-13 DIAGNOSIS — Z319 Encounter for procreative management, unspecified: Secondary | ICD-10-CM

## 2019-02-13 DIAGNOSIS — F419 Anxiety disorder, unspecified: Secondary | ICD-10-CM

## 2019-02-13 MED ORDER — SERTRALINE HCL 25 MG PO TABS: 25.0000 mg | ORAL_TABLET | Freq: Every day | ORAL | 0 refills | Status: DC

## 2019-02-13 NOTE — Progress Notes (Signed)
  Subjective:   Patient ID: [name]    DOB: [birth year], 33 y.o. female   MRN: [id number]  [name] is a 33 y.o. female with a history of MS, fibroid, adjustment d/o, endometriosis, obesity, insomnia here for   Anxiety - Medications: zoloft 25mg  - Taking: In the morning.  Tolerating well with some sleepiness.  Denies nausea. - Current stressors: 4 kids (ages 34, 47, 67, 35) with remote learning due to COVID.  - Coping Mechanisms: taking deep breaths when she feels anxious.  [facility] but has not seen in the past few years.  Wants to establish somewhere else.  Desire for pregnancy Had tubal reversal June 2020.  LMP 01/29/2019, skipped July, had regular cycle after reversal.  Is G6 P1-2-3-4.  Is not currently taking prenatal vitamins.  Recently saw OB/GYN for dysmenorrhea and cramping, has transvaginal ultrasound scheduled for 02/2019.   Review of Systems:  Per HPI.  [place], medications and smoking status reviewed.  Objective:   BP 118/78   Pulse 74   LMP 01/29/2019 (Exact Date)   SpO2 97%  Vitals and nursing note reviewed.  General: Obese female, in no acute distress with non-toxic appearance CV: regular rate and rhythm without murmurs, rubs, or gallops Lungs: clear to auscultation bilaterally with normal work of breathing Abdomen: soft, non-tender, non-distended, normoactive bowel sounds Skin: warm, dry, no rashes or lesions Extremities: warm and well perfused, normal tone MSK: ROM grossly intact, strength intact, gait normal Neuro: Alert and oriented, speech normal Psych: Mood and affect euthymic, appropriately dressed.  GAD 7 : Generalized Anxiety Score 02/13/2019 11/28/2018 04/28/2017 01/21/2017  Nervous, Anxious, on Edge 1 3 0 0  Control/stop worrying 1 3 0 0  Worry too much - different things 1 3 2  0  Trouble relaxing 1 3 2 3   Restless 0 3 2 0  Easily annoyed or irritable 0 3 2 0  Afraid - awful might happen 1 1 0 0  Total GAD 7  Score 5 19 8 3   Anxiety Difficulty - Very difficult - -   Depression screen [facility] 2/9 02/13/2019 11/28/2018 05/23/2018  Decreased Interest 0 0 0  Down, Depressed, Hopeless 0 0 0  PHQ - 2 Score 0 0 0  Altered sleeping 0 - -  Tired, decreased energy 1 - -  Change in appetite 1 - -  Feeling bad or failure about yourself  1 - -  Trouble concentrating 1 - -  Moving slowly or fidgety/restless 0 - -  Suicidal thoughts 1 - -  PHQ-9 Score 5 - -  Some recent data might be hidden    Assessment & Plan:   Anxiety Much improved with initiation of Zoloft, GAD 5 today.  Will continue at current dose.  Resources provided for area counseling and expressed benefit with conjection of medication.  Follow-up in 3 months unless issues arise.  Desire for pregnancy Had tubal reversal in June 2020. Would need to see high-risk obstetrics given history of MS on biologic infusions and prior BTL, already established with OB/GYN.  Instructed to notify neurology of desire for pregnancy.  Advised to take prenatal vitamins.  No orders of the defined types were placed in this encounter.  No orders of the defined types were placed in this encounter.   Rory Percy, DO PGY-3, [facility] 02/13/2019 10:00 AM

## 2019-02-13 NOTE — Addendum Note (Signed)
Addended by: Myles Gip on: 02/13/2019 12:02 PM   Modules accepted: Orders

## 2019-02-13 NOTE — Assessment & Plan Note (Signed)
Had tubal reversal in June 2020. Would need to see high-risk obstetrics given history of MS on biologic infusions and prior BTL, already established with OB/GYN.  Instructed to notify neurology of desire for pregnancy.  Advised to take prenatal vitamins.

## 2019-02-13 NOTE — Assessment & Plan Note (Signed)
Much improved with initiation of Zoloft, GAD 5 today.  Will continue at current dose.  Resources provided for area counseling and expressed benefit with conjection of medication.  Follow-up in 3 months unless issues arise.

## 2019-02-13 NOTE — Patient Instructions (Addendum)
It was great to see you!  Our plans for today:  -We will continue your Zoloft at the current dose.  Contact your pharmacy if you need a refill. -See below for resources for counseling. -Take prenatal vitamins and let your neurologist know for your desire for pregnancy.  Take care and seek immediate care sooner if you develop any concerns.   Dr. [facility]  Therapy and Counseling Resources Most providers on this list will take Medicaid. Patients with commercial insurance or Medicare should contact their insurance company to get a list of in network providers.  [place] [address] [place], [place] [address]       [phone]     [facility] Services [address] [place], [place]    Jinny Blossom [facility] [address] [place], [place]  Family Solutions:  [place]. [place]  [facility] Counseling:  [place] [address]oni [place], [place]  [facility] (under & uninsured) [address], [place] [phone]    [email or web address]    [facility] [place]     Phone:  [phone]     [place]  [place] [address]      [place], [place] ext [place]: [place], [place], [place]   [place] ([place] therapist) [address] [place] [address]   [place] [address]    [phone]    [facility]   [address],  [place], [place]   [place]  [place]  [facility]  [address] [place], [place]        [phone]  Open Access/Walk In Clinic under & uninsured [place],  [address], [place]  [phone]):  Mon - Fri from 8 AM - 3 PM  [facility] the [place],  ([place])   [place], [place]: [phone]) 8:30 - 12; 1 - 2:30  Family Service of the [place],  [place], [place]    ([phone]):8:30 - 12; 2 - 3PM  [facility] [employer],  [address],  [place]; [phone]):   Mon - Fri 8 AM - 5 PM  [facility] [place]  MWF 12:30 to 3:00 or call to schedule an appointment  [phone]  Specific Provider options Psychology Today  [email or web address]/us 1. click on find a therapist  2. enter your zip code 3. left side and select or tailor a therapist for your specific need.   [facility] Provider Directory [email or web address]  (Medicaid)   Follow all drop down to find a provider  [place] or [email or web address] [address], [place], [place] Recovery support and educational   In home counseling [place] Telephone: [phone]  office in [place] [email or web address]   Does not take reg. Medicaid or Medicare private insurance B[facility], [place] health Choice, [facility], [place], [place], [place], [place] Health Choice  24- Hour Availability:  . [place] or [phone]  . Family Service of the [employer] [phone]  [facility] Crisis Service  [phone]   .  [place]  [phone] (after hours)  . Therapeutic Alternative/Mobile Crisis   6062748604  . [place] National Suicide Hotline  8203260750 ([place])  . Call 911 or go to emergency room  . [employer]  [phone]);  [place] and [employer]   . [place] ACCESS  [phone]); [place], [place], [place], [employer], [place], [place], [place]

## 2019-02-21 ENCOUNTER — Inpatient Hospital Stay (HOSPITAL_COMMUNITY)
Admission: AD | Admit: 2019-02-21 | Discharge: 2019-02-21 | Disposition: A | Discharge status: Home / Self Care | Payer: Medicare HMO | Attending: Family Medicine | Admitting: Family Medicine

## 2019-02-21 ENCOUNTER — Other Ambulatory Visit: Payer: Self-pay

## 2019-02-21 DIAGNOSIS — Z3202 Encounter for pregnancy test, result negative: Secondary | ICD-10-CM | POA: Insufficient documentation

## 2019-02-21 DIAGNOSIS — N926 Irregular menstruation, unspecified: Secondary | ICD-10-CM | POA: Diagnosis not present

## 2019-02-21 DIAGNOSIS — R102 Pelvic and perineal pain: Secondary | ICD-10-CM | POA: Diagnosis not present

## 2019-02-21 LAB — URINALYSIS, ROUTINE W REFLEX MICROSCOPIC
Bacteria, UA: NONE SEEN
Bilirubin Urine: NEGATIVE
Glucose, UA: NEGATIVE mg/dL
Ketones, ur: NEGATIVE mg/dL
Leukocytes,Ua: NEGATIVE
Nitrite: NEGATIVE
Protein, ur: NEGATIVE mg/dL
Specific Gravity, Urine: 1.023 (ref 1.005–1.030)
pH: 5 (ref 5.0–8.0)

## 2019-02-21 LAB — POCT PREGNANCY, URINE: Preg Test, Ur: NEGATIVE

## 2019-02-21 NOTE — Discharge Instructions (Signed)
Abnormal Uterine Bleeding Abnormal uterine bleeding is unusual bleeding from the uterus. It includes:  Bleeding or spotting between periods.  Bleeding after sex.  Bleeding that is heavier than normal.  Periods that last longer than usual.  Bleeding after menopause. Abnormal uterine bleeding can affect women at various stages in life, including teenagers, women in their reproductive years, pregnant women, and women who have reached menopause. Common causes of abnormal uterine bleeding include:  Pregnancy.  Growths of tissue (polyps).  A noncancerous tumor in the uterus (fibroid).  Infection.  Cancer.  Hormonal imbalances. Any type of abnormal bleeding should be evaluated by a health care provider. Many cases are minor and simple to treat, while others are more serious. Treatment will depend on the cause of the bleeding. Follow these instructions at home:  Monitor your condition for any changes.  Do not use tampons, douche, or have sex if told by your health care provider.  Change your pads often.  Get regular exams that include pelvic exams and cervical cancer screening.  Keep all follow-up visits as told by your health care provider. This is important. Contact a health care provider if:  Your bleeding lasts for more than one week.  You feel dizzy at times.  You feel nauseous or you vomit. Get help right away if:  You pass out.  Your bleeding soaks through a pad every hour.  You have abdominal pain.  You have a fever.  You become sweaty or weak.  You pass large blood clots from your vagina. Summary  Abnormal uterine bleeding is unusual bleeding from the uterus.  Any type of abnormal bleeding should be evaluated by a health care provider. Many cases are minor and simple to treat, while others are more serious.  Treatment will depend on the cause of the bleeding. This information is not intended to replace advice given to you by your health care provider.  Make sure you discuss any questions you have with your health care provider. Document Released: 06/01/2005 Document Revised: 09/08/2017 Document Reviewed: 07/03/2016 Elsevier Patient Education  2020 Elsevier Inc.  

## 2019-02-21 NOTE — MAU Note (Signed)
Having bad pelvic pain(started about 4 days ago) and a flushed feeling. Had tubal reversal in June.  Called dr, was told pain might be where she is pregnant in her tubes, has not done a preg test.  Yesterday had some bleeding when she wiped, none today.

## 2019-02-21 NOTE — MAU Provider Note (Signed)
First Provider Initiated Contact with Patient 02/21/19 1752      S [name] is a 33 y.o. [phone] non-pregnant female who presents to MAU today with complaint of irregular cycles and pelvic pain. She had a tubal reversal in June 2020 and is concerned about pregnancy.   O BP (!) 169/96 (BP Location: Right Arm)   Pulse 82   Temp 98.2 F (36.8 C) (Oral)   Resp 16   Ht 5\' 4"  (1.626 m)   Wt 107.2 kg   LMP 01/29/2019 (Exact Date)   SpO2 100%   BMI 40.56 kg/m  Physical Exam  Constitutional: She appears well-developed and well-nourished. No distress.  Respiratory: Effort normal. No respiratory distress.  Skin: She is not diaphoretic.  Psychiatric: She has a normal mood and affect. Her behavior is normal.    A Non pregnant female Medical screening exam complete 1. Menstrual periods irregular   2. Negative pregnancy test   3. Pelvic pain     P Discharge from MAU in stable condition Patient given the option of transfer to [facility] for further evaluation or seek care in outpatient facility of choice. Patient desires to follow up with her provider. List of options for follow-up given  Warning signs for worsening condition that would warrant emergency follow-up discussed Patient may return to MAU as needed for pregnancy related complaints  Wende Mott, CNM 02/21/2019 5:53 PM

## 2019-02-23 ENCOUNTER — Other Ambulatory Visit: Payer: Self-pay

## 2019-02-23 ENCOUNTER — Ambulatory Visit (HOSPITAL_COMMUNITY)
Admission: RE | Admit: 2019-02-23 | Discharge: 2019-02-23 | Disposition: A | Discharge status: Home / Self Care | Payer: Medicare HMO | Source: Ambulatory Visit | Attending: Obstetrics & Gynecology | Admitting: Obstetrics & Gynecology

## 2019-02-23 DIAGNOSIS — N946 Dysmenorrhea, unspecified: Secondary | ICD-10-CM | POA: Diagnosis not present

## 2019-02-26 ENCOUNTER — Other Ambulatory Visit: Payer: Self-pay | Admitting: Family Medicine

## 2019-05-08 ENCOUNTER — Inpatient Hospital Stay (HOSPITAL_COMMUNITY)
Admission: AD | Admit: 2019-05-08 | Discharge: 2019-05-08 | Disposition: A | Discharge status: Home / Self Care | Payer: Medicare HMO | Attending: Obstetrics & Gynecology | Admitting: Obstetrics & Gynecology

## 2019-05-08 ENCOUNTER — Other Ambulatory Visit: Payer: Self-pay

## 2019-05-08 DIAGNOSIS — R109 Unspecified abdominal pain: Secondary | ICD-10-CM | POA: Diagnosis not present

## 2019-05-08 DIAGNOSIS — R030 Elevated blood-pressure reading, without diagnosis of hypertension: Secondary | ICD-10-CM | POA: Diagnosis not present

## 2019-05-08 DIAGNOSIS — Z3202 Encounter for pregnancy test, result negative: Secondary | ICD-10-CM | POA: Insufficient documentation

## 2019-05-08 DIAGNOSIS — R1110 Vomiting, unspecified: Secondary | ICD-10-CM | POA: Diagnosis not present

## 2019-05-08 LAB — HCG, QUANTITATIVE, PREGNANCY: hCG, Beta Chain, Quant, S: <1 m[IU]/mL (ref <5)

## 2019-05-08 LAB — POCT PREGNANCY, URINE: Preg Test, Ur: NEGATIVE

## 2019-05-08 NOTE — MAU Note (Addendum)
Patient presents to MAU c/o abdominal cramps and vomiting "yellow stuff" and headache. Patient reports "faint line" on home preg test. Patient denies bleeding at this time. Patient reports tubal reversal in June.

## 2019-05-08 NOTE — MAU Provider Note (Addendum)
None     S [name] is a 33 y.o. [phone] non-pregnant female who presents to MAU today with complaint of abdominal pain and vomiting. She reports she had a normal menstrual cycle the 26th of October.  She denies usage of birth control and reports having a tubal reversal in June.  She reports that she is not planning a pregnancy. However, she states she took a pregnancy test this morning that was faintly positive.  She also reports that her blood pressure is normally elevated, but that she does not have a primary care provider who is managing it.   O BP (!) 158/100 (BP Location: Left Arm)   Pulse (!) 116   Resp 17   Ht 5\' 4"  (1.626 m)   Wt 109.5 kg   BMI 41.42 kg/m  Physical Exam  Constitutional: She is oriented to person, place, and time.  Obese  HENT:  Head: Normocephalic and atraumatic.  Eyes: Conjunctivae are normal.  Neck: Normal range of motion.  Cardiovascular: Normal rate, regular rhythm and normal heart sounds.  Respiratory: Effort normal and breath sounds normal.  GI: Soft.  Musculoskeletal: Normal range of motion.  Neurological: She is alert and oriented to person, place, and time.  Psychiatric: She has a normal mood and affect. Her behavior is normal.    A Non pregnant female Medical screening exam complete Abdominal Pain  P -Discussed importance of follow up and management for elevated blood pressure and how worsening condition could lead to stroke or death.  -Offered and accepts hCG for evaluation of pregnancy status. -Patient informed of wait time with this test being up to 2.5 hours after collection. -Patient given option to wait, have provider call her, or view her results on mychart. -Patient reports that she is okay with the wait and would like to stay because she is cramping bad and would like it evaluated. -Informed that if results return negative she would be transferred to [facility] for evaluation.  -Patient denies questions or concerns at  current. -hCG ordered and will await results.   Gavin Pound, CNM 05/08/2019 7:14 AM     HCG <1 Pt not in lobby Called patient, verified by name & DOB - notified of negative HCG Can follow up with her PCP, urgent care, or the ED.   Jorje Guild, NP  05/08/2019 8:49 AM

## 2019-05-08 NOTE — MAU Note (Signed)
Patient reports bleeding just now with urination.

## 2019-05-09 ENCOUNTER — Ambulatory Visit (INDEPENDENT_AMBULATORY_CARE_PROVIDER_SITE_OTHER): Payer: Medicare HMO | Admitting: Family Medicine

## 2019-05-09 VITALS — BP 140/90 | HR 92 | Ht 64.0 in | Wt 242.5 lb

## 2019-05-09 DIAGNOSIS — N800 Endometriosis of uterus: Secondary | ICD-10-CM | POA: Diagnosis not present

## 2019-05-09 DIAGNOSIS — R109 Unspecified abdominal pain: Secondary | ICD-10-CM

## 2019-05-09 DIAGNOSIS — R030 Elevated blood-pressure reading, without diagnosis of hypertension: Secondary | ICD-10-CM

## 2019-05-09 DIAGNOSIS — N8003 Adenomyosis of the uterus: Secondary | ICD-10-CM

## 2019-05-09 LAB — POCT URINALYSIS DIP (MANUAL ENTRY)
Bilirubin, UA: NEGATIVE
Glucose, UA: NEGATIVE mg/dL
Ketones, POC UA: NEGATIVE mg/dL
Leukocytes, UA: NEGATIVE
Nitrite, UA: NEGATIVE
Protein Ur, POC: NEGATIVE mg/dL
Spec Grav, UA: 1.025 (ref 1.010–1.025)
Urobilinogen, UA: 0.2 E.U./dL
pH, UA: 5.5 (ref 5.0–8.0)

## 2019-05-09 LAB — POCT UA - MICROSCOPIC ONLY

## 2019-05-09 MED ORDER — NORGESTIMATE-ETH ESTRADIOL 0.25-35 MG-MCG PO TABS: 1.0000 | ORAL_TABLET | Freq: Every day | ORAL | 11 refills | Status: DC

## 2019-05-09 NOTE — Progress Notes (Signed)
Subjective:  [name] is a 33 y.o. female who presents to the [facility] today with a chief complaint of lower abdominal pain.   HPI: Endometriosis/adenomyosis [name] has had a long history of abdominal pain that accompanies her menstrual periods.  This typically presents as a sharp 8/10 lower abdominal pain with radiation toward her pelvis.  Can be associated with nausea and vomiting.  Yesterday, she experienced significant sharp lower abdominal pain and went to be assessed in the MAU.  This pain was slightly different from her past lower abdominal pain because of radiation around her right side toward her back.  She has had one episode of nausea with NB/NB vomiting.  In the MAU, she was found to have a negative pregnancy test was told to follow-up with her primary care provider.  Today, she reports that her pain is mildly diminished compared to yesterday.  She notes that her period did begin yesterday with only a small amount of vaginal bleeding.  She denies fever, chest pain, vaginal discharge, pain with urination, back pain.  She is sexually active with her fianc.  She recently had a tubal ligation reversal, anticipating starting a family with her current partner.  She would like to be able to conceive in the future although at the moment her priority is control of her current pain.   Chief Complaint noted Review of Symptoms - see HPI PMH -ultrasound from 02/2019 with evidence of adenomyosis   Objective:  Physical Exam: BP 140/90   Pulse 92   Ht 5\' 4"  (1.626 m)   Wt 242 lb 8 oz (110 kg)   LMP 04/08/2019   SpO2 97%   BMI 41.63 kg/m    Gen: NAD, resting comfortably CV: RRR with no murmurs appreciated Pulm: NWOB, CTAB with no crackles, wheezes, or rhonchi GI: Normal bowel sounds present.  Mild tenderness to palpation of right lower quadrant with deep palpation.   Results for orders placed or performed in visit on 05/09/19 (from the past 72 hour(s))  POCT urinalysis  dipstick     Status: Abnormal   Collection Time: 05/09/19  8:30 AM  Result Value Ref Range   Color, UA yellow yellow   Clarity, UA clear clear   Glucose, UA negative negative mg/dL   Bilirubin, UA negative negative   Ketones, POC UA negative negative mg/dL   Spec Grav, UA 1.025 1.010 - 1.025   Blood, UA large (A) negative   pH, UA 5.5 5.0 - 8.0   Protein Ur, POC negative negative mg/dL   Urobilinogen, UA 0.2 0.2 or 1.0 E.U./dL   Nitrite, UA Negative Negative   Leukocytes, UA Negative Negative  POCT UA - Microscopic Only     Status: None   Collection Time: 05/09/19  8:30 AM  Result Value Ref Range   WBC, Ur, HPF, POC NONE    RBC, urine, microscopic 1-3    Bacteria, U Microscopic TRACE    Mucus, UA 1+    Epithelial cells, urine per micros 0-3      Assessment/Plan:  Adenomyosis History, physical and ultrasound findings consistent with adenomyosis.  Endometriosis cannot be excluded based on her current information.  Pregnancy test yesterday ruled out ectopic pregnancy.  No concern for STI, UTI at this time.  Blood in UA consistent with her current menstrual period.  We discussed that it can be difficult to conceive with a diagnosis with adenomyosis though this is not impossible.  She understands and is not actively trying  to conceive would like to be able to come pregnant in the future.  The best treatment for her pain at this time is NSAIDs and oral combined contraceptives. -Take Motrin on the first day of her menstrual period and continue until the pain subsides -Start oral combined contraceptives today  Elevated blood pressure reading Blood pressure in clinic today 140/90.  Chart review shows consistently elevated blood pressures in the past year.  She did have elevated blood pressures with her last pregnancy but does not otherwise take medication.  We will not start any antihypertensives today because other medication was started today.  Plan to follow-up in 1 month for further  assessment of her blood pressure possibly starting antihypertensive therapy.  Her elevated blood pressure today and yesterday may be related to her discomfort and anxiety about her pelvic pain/adenomyosis.

## 2019-05-09 NOTE — Assessment & Plan Note (Addendum)
History, physical and ultrasound findings consistent with adenomyosis.  Endometriosis cannot be excluded based on her current information.  Pregnancy test yesterday ruled out ectopic pregnancy.  No concern for STI, UTI at this time.  Blood in UA consistent with her current menstrual period.  We discussed that it can be difficult to conceive with a diagnosis with adenomyosis though this is not impossible.  She understands and is not actively trying to conceive would like to be able to come pregnant in the future.  The best treatment for her pain at this time is NSAIDs and oral combined contraceptives. -Take Motrin on the first day of her menstrual period and continue until the pain subsides -Start oral combined contraceptives today

## 2019-05-09 NOTE — Patient Instructions (Addendum)
The best way to control you pain for now is oral contraceptives and motrin.    For Motrin, start taking motrin as soon as you period begins.  For the birth control, you can start taking it today.    For your elevated blood pressure, I would like to follow up in one month.  It may be better to avoid starting too many medications today.  The other term I used in clinic today was Adenomyosis.  I'm sorry, I wasn't able to find quick information on that.   Endometriosis  Endometriosis is a condition in which the tissue that lines the uterus (endometrium) grows outside of its normal location. The tissue may grow in many locations close to the uterus, but it commonly grows on the ovaries, fallopian tubes, vagina, or bowel. When the uterus sheds the endometrium every menstrual cycle, there is bleeding wherever the endometrial tissue is located. This can cause pain because blood is irritating to tissues that are not normally exposed to it. What are the causes? The cause of endometriosis is not known. What increases the risk? You may be more likely to develop endometriosis if you:  Have a family history of endometriosis.  Have never given birth.  Started your period at age 100 or younger.  Have high levels of estrogen in your body.  Were exposed to a certain medicine (diethylstilbestrol) before you were born (in utero).  Had low birth weight.  Were born as a twin, triplet, or other multiple.  Have a BMI of less than 25. BMI is an estimate of body fat and is calculated from height and weight. What are the signs or symptoms? Often, there are no symptoms of this condition. If you do have symptoms, they may:  Vary depending on where your endometrial tissue is growing.  Occur during your menstrual period (most common) or midcycle.  Come and go, or you may go months with no symptoms at all.  Stop with menopause. Symptoms may include:  Pain in the back or abdomen.  Heavier bleeding  during periods.  Pain during sex.  Painful bowel movements.  Infertility.  Pelvic pain.  Bleeding more than once a month. How is this diagnosed? This condition is diagnosed based on your symptoms and a physical exam. You may have tests, such as:  Blood tests and urine tests. These may be done to help rule out other possible causes of your symptoms.  Ultrasound, to look for abnormal tissues.  An X-ray of the lower bowel (barium enema).  An ultrasound that is done through the vagina (transvaginally).  CT scan.  MRI.  Laparoscopy. In this procedure, a lighted, pencil-sized instrument called a laparoscope is inserted into your abdomen through an incision. The laparoscope allows your health care provider to look at the organs inside your body and check for abnormal tissue to confirm the diagnosis. If abnormal tissue is found, your health care provider may remove a small piece of tissue (biopsy) to be examined under a microscope. How is this treated? Treatment for this condition may include:  Medicines to relieve pain, such as NSAIDs.  Hormone therapy. This involves using artificial (synthetic) hormones to reduce endometrial tissue growth. Your health care provider may recommend using a hormonal form of birth control, or other medicines.  Surgery. This may be done to remove abnormal endometrial tissue. ? In some cases, tissue may be removed using a laparoscope and a laser (laparoscopic laser treatment). ? In severe cases, surgery may be done to remove the  fallopian tubes, uterus, and ovaries (hysterectomy). Follow these instructions at home:  Take over-the-counter and prescription medicines only as told by your health care provider.  Do not drive or use heavy machinery while taking prescription pain medicine.  Try to avoid activities that cause pain, including sexual activity.  Keep all follow-up visits as told by your health care provider. This is important. Contact a health  care provider if:  You have pain in the area between your hip bones (pelvic area) that occurs: ? Before, during, or after your period. ? In between your period and gets worse during your period. ? During or after sex. ? With bowel movements or urination, especially during your period.  You have problems getting pregnant.  You have a fever. Get help right away if:  You have severe pain that does not get better with medicine.  You have severe nausea and vomiting, or you cannot eat without vomiting.  You have pain that affects only the lower, right side of your abdomen.  You have abdominal pain that gets worse.  You have abdominal swelling.  You have blood in your stool. This information is not intended to replace advice given to you by your health care provider. Make sure you discuss any questions you have with your health care provider. Document Released: 05/29/2000 Document Revised: 05/14/2017 Document Reviewed: 11/02/2015 Elsevier Patient Education  2020 [employer].

## 2019-05-09 NOTE — Assessment & Plan Note (Signed)
Blood pressure in clinic today 140/90.  Chart review shows consistently elevated blood pressures in the past year.  She did have elevated blood pressures with her last pregnancy but does not otherwise take medication.  We will not start any antihypertensives today because other medication was started today.  Plan to follow-up in 1 month for further assessment of her blood pressure possibly starting antihypertensive therapy.  Her elevated blood pressure today and yesterday may be related to her discomfort and anxiety about her pelvic pain/adenomyosis.

## 2019-07-10 ENCOUNTER — Other Ambulatory Visit: Payer: Self-pay | Admitting: Family Medicine

## 2019-09-24 ENCOUNTER — Encounter (HOSPITAL_COMMUNITY): Payer: Self-pay

## 2019-09-24 ENCOUNTER — Inpatient Hospital Stay (HOSPITAL_COMMUNITY)
Admission: AD | Admit: 2019-09-24 | Discharge: 2019-09-25 | Discharge status: Against Medical Advice | Payer: Medicare HMO | Attending: Obstetrics & Gynecology | Admitting: Obstetrics & Gynecology

## 2019-09-24 ENCOUNTER — Other Ambulatory Visit: Payer: Self-pay

## 2019-09-24 DIAGNOSIS — Z5329 Procedure and treatment not carried out because of patient's decision for other reasons: Secondary | ICD-10-CM | POA: Insufficient documentation

## 2019-09-24 DIAGNOSIS — R109 Unspecified abdominal pain: Secondary | ICD-10-CM | POA: Insufficient documentation

## 2019-09-24 DIAGNOSIS — R1110 Vomiting, unspecified: Secondary | ICD-10-CM | POA: Insufficient documentation

## 2019-09-24 NOTE — MAU Note (Addendum)
Tubal reversal last June.  Period didn't come this month, took pregnancy test last week, light positive. Vomiting started today and cramps. No bleeding. Fever of 101 at 6 pm. Took Tylenol Liquid at that same time. Took Covid vaccine last month and will be going for second shot on this Wednesday.   Having to pee frequently, voiding on self several times today. Doesn't hurt to pee. Right side flank pain started today.

## 2019-09-25 DIAGNOSIS — Z5329 Procedure and treatment not carried out because of patient's decision for other reasons: Secondary | ICD-10-CM | POA: Diagnosis not present

## 2019-09-25 DIAGNOSIS — R1110 Vomiting, unspecified: Secondary | ICD-10-CM | POA: Diagnosis present

## 2019-09-25 DIAGNOSIS — R109 Unspecified abdominal pain: Secondary | ICD-10-CM | POA: Diagnosis not present

## 2019-09-25 LAB — URINALYSIS, ROUTINE W REFLEX MICROSCOPIC
Bacteria, UA: NONE SEEN
Bilirubin Urine: NEGATIVE
Glucose, UA: NEGATIVE mg/dL
Ketones, ur: NEGATIVE mg/dL
Leukocytes,Ua: NEGATIVE
Nitrite: NEGATIVE
Protein, ur: NEGATIVE mg/dL
Specific Gravity, Urine: 1.015 (ref 1.005–1.030)
pH: 8 (ref 5.0–8.0)

## 2019-09-25 LAB — HCG, QUANTITATIVE, PREGNANCY: hCG, Beta Chain, Quant, S: <1 m[IU]/mL (ref <5)

## 2019-09-25 LAB — POCT PREGNANCY, URINE: Preg Test, Ur: NEGATIVE

## 2019-09-25 NOTE — MAU Note (Signed)
Patient had blood work done in MAU, I let her know her urine pregnancy test was negative but that we were doing a blood pregnancy test and that her urine was sent for urinalysis, to check for UTI.  Told pt if her blood pregnancy test is negative, then she would be transferred to ED. Pt voiced understanding.  Instructed pt to wait for results.  MAU registration called and said pt left with her visitor.  Notified Julianne Handler CNM.  Pt left AMA.

## 2019-09-26 ENCOUNTER — Encounter (HOSPITAL_COMMUNITY): Payer: Self-pay | Admitting: *Deleted

## 2019-09-26 ENCOUNTER — Other Ambulatory Visit: Payer: Self-pay

## 2019-09-26 ENCOUNTER — Emergency Department (HOSPITAL_COMMUNITY): Payer: Medicare HMO

## 2019-09-26 DIAGNOSIS — Z79899 Other long term (current) drug therapy: Secondary | ICD-10-CM | POA: Insufficient documentation

## 2019-09-26 DIAGNOSIS — R05 Cough: Secondary | ICD-10-CM | POA: Diagnosis present

## 2019-09-26 DIAGNOSIS — U071 COVID-19: Secondary | ICD-10-CM | POA: Diagnosis not present

## 2019-09-26 DIAGNOSIS — J22 Unspecified acute lower respiratory infection: Secondary | ICD-10-CM | POA: Insufficient documentation

## 2019-09-26 MED ORDER — ACETAMINOPHEN 325 MG PO TABS
650.0000 mg | ORAL_TABLET | Freq: Once | ORAL | Status: AC | PRN
  Administered 2019-09-26: 21:00:00 650 mg via ORAL
  Filled 2019-09-26: qty 2

## 2019-09-26 NOTE — ED Triage Notes (Signed)
Pt reporting that she has had cough (yellow productive), shortness of breath, body aches, fever,  congestion and headache for 3 days

## 2019-09-27 ENCOUNTER — Encounter (HOSPITAL_COMMUNITY): Payer: Self-pay | Admitting: Emergency Medicine

## 2019-09-27 ENCOUNTER — Emergency Department (HOSPITAL_COMMUNITY)
Admission: EM | Admit: 2019-09-27 | Discharge: 2019-09-27 | Disposition: A | Discharge status: Home / Self Care | Payer: Medicare HMO | Attending: Emergency Medicine | Admitting: Emergency Medicine

## 2019-09-27 DIAGNOSIS — J069 Acute upper respiratory infection, unspecified: Secondary | ICD-10-CM

## 2019-09-27 DIAGNOSIS — U071 COVID-19: Secondary | ICD-10-CM | POA: Diagnosis not present

## 2019-09-27 LAB — SARS CORONAVIRUS 2 (TAT 6-24 HRS): SARS Coronavirus 2: POSITIVE — AB

## 2019-09-27 MED ORDER — NAPROXEN 500 MG PO TABS
500.0000 mg | ORAL_TABLET | Freq: Once | ORAL | Status: AC
  Administered 2019-09-27: 500 mg via ORAL
  Filled 2019-09-27: qty 1

## 2019-09-27 MED ORDER — ONDANSETRON 8 MG PO TBDP: 8.0000 mg | ORAL_TABLET | Freq: Three times a day (TID) | ORAL | 0 refills | Status: DC | PRN

## 2019-09-27 MED ORDER — ONDANSETRON 8 MG PO TBDP
8.0000 mg | ORAL_TABLET | Freq: Once | ORAL | Status: AC
  Administered 2019-09-27: 8 mg via ORAL
  Filled 2019-09-27: qty 1

## 2019-09-27 MED ORDER — ALBUTEROL SULFATE HFA 108 (90 BASE) MCG/ACT IN AERS
2.0000 | INHALATION_SPRAY | RESPIRATORY_TRACT | Status: DC | PRN
  Administered 2019-09-27: 04:00:00 2 via RESPIRATORY_TRACT
  Filled 2019-09-27: qty 6.7

## 2019-09-27 NOTE — ED Provider Notes (Signed)
[facility] DEPT Provider Note: Georgena Spurling, MD, FACEP  CSN: [id number] MRN: [id number] ARRIVAL: 09/26/19 at [place]: [place]  09/27/19 3:57 AM [name] is a 34 y.o. female with a 3-day history of fever to 103, chills, body aches, shortness of breath, cough productive of green sputum, nausea and vomiting.  She is also had loss of taste and smell.  She rates her body aches as a 10 out of 10.  She was given acetaminophen on arrival with improvement in her fever.  She is having difficulty taking a deep breath due to pain in her chest.   Past Medical History:  Diagnosis Date  . Abnormal Pap smear    Colpo>normal  . Gonorrhea   . Headache    last migraine 01/2017  . History of IBS    watch diet, no meds  . Multiple sclerosis ([place])   . Neuromuscular disorder [facility])    diagnosed with MS this visit  . Obesity   . Ovarian cyst   . Pernicious anemia 05/02/2013  . Preterm labor    all 3 deliveries  . SVD (spontaneous vaginal delivery)    x 3  . Urinary tract infection   . Vaginal Pap smear, abnormal     Past Surgical History:  Procedure Laterality Date  . CHOLECYSTECTOMY N/A 11/25/2017   Procedure: LAPAROSCOPIC CHOLECYSTECTOMY WITH INTRAOPERATIVE CHOLANGIOGRAM;  Surgeon: Armandina Gemma, MD;  Location: [facility] ORS;  Service: General;  Laterality: N/A;  . COLPOSCOPY    . MYOMECTOMY N/A 04/01/2017   Procedure: Vaginal Myomectomy;  Surgeon: Osborne Oman, MD;  Location: [facility];  Service: Gynecology;  Laterality: N/A;  . TUBAL LIGATION     2012  . WISDOM TOOTH EXTRACTION      Family History  Problem Relation Age of Onset  . Diabetes Mother   . Hypertension Mother   . Diabetes Father   . Hypertension Father   . Anesthesia problems Neg Hx   . Other Neg Hx     Social History   Tobacco Use  . Smoking status: Never Smoker  . Smokeless tobacco: Never Used  Substance Use Topics  . Alcohol use: No   Alcohol/week: 0.0 standard drinks    Comment:    . Drug use: No    Prior to Admission medications   Medication Sig Start Date End Date Taking? Authorizing Provider  norgestimate-ethinyl estradiol (SPRINTEC 28) 0.25-35 MG-MCG tablet Take 1 tablet by mouth daily. 05/09/19   Matilde Haymaker, MD  ocrelizumab (OCREVUS) 300 MG/10ML injection Inject 600 mg into the vein every 6 (six) months.    [provider]  sertraline (ZOLOFT) 25 MG tablet TAKE 1 TABLET BY MOUTH EVERY DAY 07/10/19   Rory Percy, DO    Allergies Patient has no known allergies.   REVIEW OF SYSTEMS  Negative except as noted here or in the History of Present Illness.   PHYSICAL EXAMINATION  Initial Vital Signs Blood pressure (!) 141/94, pulse 86, temperature 100.1 F (37.8 C), resp. rate 20, height 5\' 4"  (1.626 m), weight 108.9 kg, last menstrual period 09/14/2019, SpO2 99 %.  Examination General: Well-developed, well-nourished female in no acute distress; appearance consistent with age of record HENT: normocephalic; atraumatic Eyes: pupils equal, round and reactive to light; extraocular muscles intact Neck: supple Heart: regular rate and rhythm Lungs: clear to auscultation bilaterally; shallow breaths Abdomen: soft; nondistended; nontender; bowel sounds present Extremities: No deformity; full range  of motion; pulses normal Neurologic: Awake, alert and oriented; motor function intact in all extremities and symmetric; no facial droop Skin: Warm and dry Psychiatric: Normal mood and affect   RESULTS  Summary of this visit's results, reviewed and interpreted by myself:   EKG Interpretation  Date/Time:    Ventricular Rate:    PR Interval:    QRS Duration:   QT Interval:    QTC Calculation:   R Axis:     Text Interpretation:        Laboratory Studies: No results found for this or any previous visit (from the past 24 hour(s)). Imaging Studies: DG Chest Portable 1 View  Result Date:  09/26/2019 CLINICAL DATA:  Cough, fever EXAM: PORTABLE CHEST 1 VIEW COMPARISON:  07/29/2016 FINDINGS: The heart size and mediastinal contours are within normal limits. Both lungs are clear. The visualized skeletal structures are unremarkable. IMPRESSION: No active disease. Electronically Signed   By: Randa Ngo M.D.   On: 09/26/2019 21:18    ED COURSE and MDM  Nursing notes, initial and subsequent vitals signs, including pulse oximetry, reviewed and interpreted by myself.  Vitals:   09/26/19 2046 09/26/19 2047 09/27/19 0356  BP: (!) 142/101  (!) 141/94  Pulse: 94  86  Resp: 18  20  Temp: (!) 103 F (39.4 C)  100.1 F (37.8 C)  SpO2: 96%  99%  Weight:  108.9 kg   Height:  5\' 4"  (1.626 m)    Medications  albuterol (VENTOLIN HFA) 108 (90 Base) MCG/ACT inhaler 2 puff (has no administration in time range)  naproxen (NAPROSYN) tablet 500 mg (has no administration in time range)  ondansetron (ZOFRAN-ODT) disintegrating tablet 8 mg (has no administration in time range)  acetaminophen (TYLENOL) tablet 650 mg (650 mg Oral Given 09/26/19 2054)   The patient almost certainly has an acute COVID-19 infection given her symptomatology.  Her chest x-ray is clear and her oxygen saturation is 99%.  She does not meet criteria for inpatient treatment or outpatient infusion treatment at this time.  We will provide the patient with an inhaler and treat her nausea.   PROCEDURES  Procedures   ED DIAGNOSES     ICD-10-CM   1. Acute respiratory disease due to COVID-19 virus  U07.1    J06.9        Nuri Larmer, MD 09/27/19 [phone]

## 2019-09-29 ENCOUNTER — Ambulatory Visit (HOSPITAL_COMMUNITY)
Admission: RE | Admit: 2019-09-29 | Discharge: 2019-09-29 | Disposition: A | Discharge status: Home / Self Care | Payer: Medicare Other | Source: Ambulatory Visit | Attending: Pulmonary Disease | Admitting: Pulmonary Disease

## 2019-09-29 ENCOUNTER — Emergency Department (HOSPITAL_BASED_OUTPATIENT_CLINIC_OR_DEPARTMENT_OTHER): Payer: Medicare HMO

## 2019-09-29 ENCOUNTER — Other Ambulatory Visit: Payer: Self-pay | Admitting: Unknown Physician Specialty

## 2019-09-29 ENCOUNTER — Encounter (HOSPITAL_BASED_OUTPATIENT_CLINIC_OR_DEPARTMENT_OTHER): Payer: Self-pay | Admitting: *Deleted

## 2019-09-29 ENCOUNTER — Telehealth: Payer: Self-pay | Admitting: Unknown Physician Specialty

## 2019-09-29 ENCOUNTER — Emergency Department (HOSPITAL_BASED_OUTPATIENT_CLINIC_OR_DEPARTMENT_OTHER)
Admission: EM | Admit: 2019-09-29 | Discharge: 2019-09-30 | Disposition: A | Discharge status: Home / Self Care | Payer: Medicare HMO | Attending: Emergency Medicine | Admitting: Emergency Medicine

## 2019-09-29 ENCOUNTER — Other Ambulatory Visit: Payer: Self-pay

## 2019-09-29 DIAGNOSIS — R112 Nausea with vomiting, unspecified: Secondary | ICD-10-CM | POA: Insufficient documentation

## 2019-09-29 DIAGNOSIS — U071 COVID-19: Secondary | ICD-10-CM

## 2019-09-29 DIAGNOSIS — Z79899 Other long term (current) drug therapy: Secondary | ICD-10-CM | POA: Diagnosis not present

## 2019-09-29 DIAGNOSIS — R0602 Shortness of breath: Secondary | ICD-10-CM | POA: Diagnosis not present

## 2019-09-29 DIAGNOSIS — J1289 Other viral pneumonia: Secondary | ICD-10-CM | POA: Insufficient documentation

## 2019-09-29 DIAGNOSIS — Z6841 Body Mass Index (BMI) 40.0 and over, adult: Secondary | ICD-10-CM | POA: Diagnosis present

## 2019-09-29 DIAGNOSIS — Z23 Encounter for immunization: Secondary | ICD-10-CM | POA: Diagnosis not present

## 2019-09-29 DIAGNOSIS — J1282 Pneumonia due to coronavirus disease 2019: Secondary | ICD-10-CM

## 2019-09-29 LAB — CBC WITH DIFFERENTIAL/PLATELET
Abs Immature Granulocytes: 0.02 10*3/uL (ref 0.00–0.07)
Basophils Absolute: 0 10*3/uL (ref 0.0–0.1)
Basophils Relative: 0 %
Eosinophils Absolute: 0.1 10*3/uL (ref 0.0–0.5)
Eosinophils Relative: 1 %
HCT: 40.3 % (ref 36.0–46.0)
Hemoglobin: 12.6 g/dL (ref 12.0–15.0)
Immature Granulocytes: 0 %
Lymphocytes Relative: 17 %
Lymphs Abs: 0.9 10*3/uL (ref 0.7–4.0)
MCH: 30 pg (ref 26.0–34.0)
MCHC: 31.3 g/dL (ref 30.0–36.0)
MCV: 96 fL (ref 80.0–100.0)
Monocytes Absolute: 0.3 10*3/uL (ref 0.1–1.0)
Monocytes Relative: 6 %
Neutro Abs: 4 10*3/uL (ref 1.7–7.7)
Neutrophils Relative %: 76 %
Platelets: 359 10*3/uL (ref 150–400)
RBC: 4.2 MIL/uL (ref 3.87–5.11)
RDW: 12 % (ref 11.5–15.5)
WBC: 5.3 10*3/uL (ref 4.0–10.5)
nRBC: 0 % (ref 0.0–0.2)

## 2019-09-29 LAB — COMPREHENSIVE METABOLIC PANEL
ALT: 33 U/L (ref 0–44)
AST: 41 U/L (ref 15–41)
Albumin: 4 g/dL (ref 3.5–5.0)
Alkaline Phosphatase: 61 U/L (ref 38–126)
Anion gap: 11 (ref 5–15)
BUN: 8 mg/dL (ref 6–20)
CO2: 28 mmol/L (ref 22–32)
Calcium: 9 mg/dL (ref 8.9–10.3)
Chloride: 95 mmol/L — ABNORMAL LOW (ref 98–111)
Creatinine, Ser: 1.08 mg/dL — ABNORMAL HIGH (ref 0.44–1.00)
GFR calc Af Amer: >60 mL/min (ref >60)
GFR calc non Af Amer: >60 mL/min (ref >60)
Glucose, Bld: 103 mg/dL — ABNORMAL HIGH (ref 70–99)
Potassium: 3.6 mmol/L (ref 3.5–5.1)
Sodium: 134 mmol/L — ABNORMAL LOW (ref 135–145)
Total Bilirubin: 1 mg/dL (ref 0.3–1.2)
Total Protein: 8.1 g/dL (ref 6.5–8.1)

## 2019-09-29 LAB — TROPONIN I (HIGH SENSITIVITY): Troponin I (High Sensitivity): 5 ng/L (ref <18)

## 2019-09-29 MED ORDER — EPINEPHRINE 0.3 MG/0.3ML IJ SOAJ: 0.3000 mg | Freq: Once | INTRAMUSCULAR | Status: DC | PRN

## 2019-09-29 MED ORDER — ACETAMINOPHEN 325 MG PO TABS
650.0000 mg | ORAL_TABLET | Freq: Four times a day (QID) | ORAL | Status: DC | PRN
  Administered 2019-09-29: 13:00:00 650 mg via ORAL
  Filled 2019-09-29: qty 2

## 2019-09-29 MED ORDER — FAMOTIDINE IN NACL 20-0.9 MG/50ML-% IV SOLN: 20.0000 mg | Freq: Once | INTRAVENOUS | Status: DC | PRN

## 2019-09-29 MED ORDER — SODIUM CHLORIDE 0.9 % IV SOLN
Freq: Once | INTRAVENOUS | Status: AC
  Filled 2019-09-29: qty 700

## 2019-09-29 MED ORDER — DIPHENHYDRAMINE HCL 50 MG/ML IJ SOLN: 50.0000 mg | Freq: Once | INTRAMUSCULAR | Status: DC | PRN

## 2019-09-29 MED ORDER — ACETAMINOPHEN 325 MG PO TABS
650.0000 mg | ORAL_TABLET | Freq: Once | ORAL | Status: AC
  Administered 2019-09-29: 650 mg via ORAL
  Filled 2019-09-29: qty 2

## 2019-09-29 MED ORDER — ONDANSETRON HCL 4 MG/2ML IJ SOLN
4.0000 mg | Freq: Once | INTRAMUSCULAR | Status: AC
  Administered 2019-09-29: 4 mg via INTRAVENOUS
  Filled 2019-09-29: qty 2

## 2019-09-29 MED ORDER — SODIUM CHLORIDE 0.9 % IV SOLN: INTRAVENOUS | Status: DC | PRN

## 2019-09-29 MED ORDER — ALBUTEROL SULFATE HFA 108 (90 BASE) MCG/ACT IN AERS: 2.0000 | INHALATION_SPRAY | Freq: Once | RESPIRATORY_TRACT | Status: DC | PRN

## 2019-09-29 MED ORDER — ONDANSETRON HCL 4 MG/2ML IJ SOLN
4.0000 mg | Freq: Once | INTRAMUSCULAR | Status: AC | PRN
  Administered 2019-09-29: 4 mg via INTRAVENOUS
  Filled 2019-09-29: qty 2

## 2019-09-29 MED ORDER — ONDANSETRON 4 MG PO TBDP
4.0000 mg | ORAL_TABLET | Freq: Once | ORAL | Status: AC
  Administered 2019-09-29: 4 mg via ORAL
  Filled 2019-09-29: qty 1

## 2019-09-29 MED ORDER — METHYLPREDNISOLONE SODIUM SUCC 125 MG IJ SOLR: 125.0000 mg | Freq: Once | INTRAMUSCULAR | Status: DC | PRN

## 2019-09-29 NOTE — Discharge Instructions (Signed)

## 2019-09-29 NOTE — Telephone Encounter (Signed)
  I connected by phone with [name] on 09/29/2019 at 8:39 AM to discuss the potential use of an new treatment for mild to moderate COVID-19 viral infection in non-hospitalized patients.  This patient is a 33 y.o. female that meets the FDA criteria for Emergency Use Authorization of bamlanivimab/etesevimab or casirivimab/imdevimab.  Has a (+) direct SARS-CoV-2 viral test result  Has mild or moderate COVID-19   Is ? 36 years of age and weighs ? 40 kg  Is NOT hospitalized due to COVID-19  Is NOT requiring oxygen therapy or requiring an increase in baseline oxygen flow rate due to COVID-19  Is within 10 days of symptom onset  Has at least one of the high risk factor(s) for progression to severe COVID-19 and/or hospitalization as defined in EUA.  Specific high risk criteria : BMI >/= 35   I have spoken and communicated the following to the patient or parent/caregiver:  1. FDA has authorized the emergency use of bamlanivimab/etesevimab and casirivimab\imdevimab for the treatment of mild to moderate COVID-19 in adults and pediatric patients with positive results of direct SARS-CoV-2 viral testing who are 39 years of age and older weighing at least 40 kg, and who are at high risk for progressing to severe COVID-19 and/or hospitalization.  2. The significant known and potential risks and benefits of bamlanivimab/etesevimab and casirivimab\imdevimab, and the extent to which such potential risks and benefits are unknown.  3. Information on available alternative treatments and the risks and benefits of those alternatives, including clinical trials.  4. Patients treated with bamlanivimab/etesevimab and casirivimab\imdevimab should continue to self-isolate and use infection control measures (e.g., wear mask, isolate, social distance, avoid sharing personal items, clean and disinfect "high touch" surfaces, and frequent handwashing) according to CDC guidelines.   5. The patient or  parent/caregiver has the option to accept or refuse bamlanivimab/etesevimab or casirivimab\imdevimab .  After reviewing this information with the patient, The patient agreed to proceed with receiving the bamlanimivab infusion and will be provided a copy of the Fact sheet prior to receiving the infusion.[name] 09/29/2019 8:39 AM

## 2019-09-29 NOTE — Progress Notes (Signed)
  Diagnosis: COVID-19  Physician:wright  Procedure: Covid Infusion Clinic Med: bamlanivimab\etesevimab infusion - Provided patient with bamlanimivab\etesevimab fact sheet for patients, parents and caregivers prior to infusion.  Complications: No immediate complications noted.  Discharge: Discharged home   Heide Scales 09/29/2019

## 2019-09-29 NOTE — ED Triage Notes (Signed)
Covid Positive x 3 days ago. Here tonight with SOB. Pain in her eyes. Body aches. Tylenol 4 hours ago.

## 2019-09-30 DIAGNOSIS — J1289 Other viral pneumonia: Secondary | ICD-10-CM | POA: Diagnosis not present

## 2019-09-30 LAB — TROPONIN I (HIGH SENSITIVITY): Troponin I (High Sensitivity): 6 ng/L (ref <18)

## 2019-09-30 MED ORDER — PROCHLORPERAZINE MALEATE 10 MG PO TABS: 10.0000 mg | ORAL_TABLET | Freq: Four times a day (QID) | ORAL | 0 refills | Status: DC | PRN

## 2019-09-30 MED ORDER — KETOROLAC TROMETHAMINE 30 MG/ML IJ SOLN
30.0000 mg | Freq: Once | INTRAMUSCULAR | Status: AC
  Administered 2019-09-30: 30 mg via INTRAVENOUS
  Filled 2019-09-30: qty 1

## 2019-09-30 MED ORDER — SODIUM CHLORIDE 0.9 % IV BOLUS
1000.0000 mL | Freq: Once | INTRAVENOUS | Status: AC
  Administered 2019-09-30: 1000 mL via INTRAVENOUS

## 2019-09-30 MED ORDER — PROCHLORPERAZINE EDISYLATE 10 MG/2ML IJ SOLN
10.0000 mg | Freq: Once | INTRAMUSCULAR | Status: AC
  Administered 2019-09-30: 01:00:00 10 mg via INTRAVENOUS
  Filled 2019-09-30: qty 2

## 2019-09-30 NOTE — ED Provider Notes (Signed)
[place] EMERGENCY DEPARTMENT Provider Note   CSN: [id number] Arrival date & time: 09/29/19  2146   History Chief Complaint  Patient presents with  . Covid Positive  . Shortness of Breath    [name] is a 34 y.o. female.  The history is provided by the patient.  Shortness of Breath She was diagnosed with COVID-19 2 days ago, and received an antibody infusion today.  She comes in with ongoing problems with fever, cough, shortness of breath, body aches, nausea, vomiting.  Temperature has been as high as 102.  It does not come down even with taking acetaminophen and ibuprofen.  She does have a productive cough, but does not look at the sputum.  She has been taking ondansetron at home, but it has not been helping her nausea and she is continuing to vomit.  She denies constipation or diarrhea.  She does endorse generalized myalgias which she rates at 10/10.  She continues to to have no sense of smell or taste.  She is a non-smoker.  Past Medical History:  Diagnosis Date  . Abnormal Pap smear    Colpo>normal  . Gonorrhea   . Headache    last migraine 01/2017  . History of IBS    watch diet, no meds  . Multiple sclerosis ([place])   . Neuromuscular disorder [facility])    diagnosed with MS this visit  . Obesity   . Ovarian cyst   . Pernicious anemia 05/02/2013  . Preterm labor    all 3 deliveries  . SVD (spontaneous vaginal delivery)    x 3  . Urinary tract infection   . Vaginal Pap smear, abnormal     Patient Active Problem List   Diagnosis Date Noted  . Adenomyosis 05/09/2019  . Elevated blood pressure reading 05/09/2019  . Desire for pregnancy 02/13/2019  . Anxiety 11/28/2018  . Endometriosis 05/23/2018  . Vaginal pain 05/09/2018  . Cholecystitis, acute with cholelithiasis 11/25/2017  . Fibroid of cervix   . Cervical mass 12/23/2016  . Child behavior problem 03/27/2016  . Malabsorption 08/29/2013  . 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  B12 deficiency 06/27/2013  . Insomnia 06/27/2013  . Adjustment disorder with mixed anxiety and depressed mood 03/13/2013  . Multiple sclerosis ([place]) 03/13/2013  . Abdominal pain 07/29/2007  . OBESITY, NOS 08/12/2006    Past Surgical History:  Procedure Laterality Date  . CHOLECYSTECTOMY N/A 11/25/2017   Procedure: LAPAROSCOPIC CHOLECYSTECTOMY WITH INTRAOPERATIVE CHOLANGIOGRAM;  Surgeon: Armandina Gemma, MD;  Location: [facility];  Service: General;  Laterality: N/A;  . COLPOSCOPY    . MYOMECTOMY N/A 04/01/2017   Procedure: Vaginal Myomectomy;  Surgeon: Osborne Oman, MD;  Location: [place] [facility];  Service: Gynecology;  Laterality: N/A;  . TUBAL LIGATION     2012  . WISDOM TOOTH EXTRACTION       OB History    Gravida  7   Para  3   Term  1   Preterm  2   AB  3   Living  4     SAB      TAB  3   Ectopic      Multiple  1   Live Births  2           Family History  Problem Relation Age of Onset  . Diabetes Mother   . Hypertension Mother   . Diabetes Father   . Hypertension Father   . Anesthesia problems Neg Hx   .  Other Neg Hx     Social History   Tobacco Use  . Smoking status: Never Smoker  . Smokeless tobacco: Never Used  Substance Use Topics  . Alcohol use: No    Alcohol/week: 0.0 standard drinks    Comment:    . Drug use: No    Home Medications Prior to Admission medications   Medication Sig Start Date End Date Taking? Authorizing Provider  ocrelizumab (OCREVUS) 300 MG/10ML injection Inject 600 mg into the vein every 6 (six) months.   Yes [provider]  norgestimate-ethinyl estradiol ([place] 28) 0.25-35 MG-MCG tablet Take 1 tablet by mouth daily. 05/09/19   Matilde Haymaker, MD  ondansetron (ZOFRAN ODT) 8 MG disintegrating tablet Take 1 tablet (8 mg total) by mouth every 8 (eight) hours as needed for nausea or vomiting. 09/27/19   Molpus, John, MD  sertraline (ZOLOFT) 25 MG tablet TAKE 1 TABLET BY MOUTH EVERY DAY 07/10/19   Rory Percy, DO     Allergies    Patient has no known allergies.  Review of Systems   Review of Systems  Respiratory: Positive for shortness of breath.   All other systems reviewed and are negative.   Physical Exam Updated Vital Signs BP 105/64 (BP Location: Right Arm)   Pulse 73   Temp (!) 101.4 F (38.6 C) (Oral)   Resp 20   Ht 5\' 4"  (1.626 m)   Wt 108.9 kg   LMP 09/14/2019   SpO2 95%   Breastfeeding Unknown   BMI 41.21 kg/m   Physical Exam Vitals and nursing note reviewed.   34 year old female, resting comfortably and in no acute distress. Vital signs are significant for elevated temperature. Oxygen saturation is 95%, which is normal. Head is normocephalic and atraumatic. PERRLA, EOMI. Oropharynx is clear. Neck is nontender and supple without adenopathy or JVD. Back is nontender and there is no CVA tenderness. Lungs are clear without rales, wheezes, or rhonchi. Chest is nontender. Heart has regular rate and rhythm without murmur. Abdomen is soft, flat, nontender without masses or hepatosplenomegaly and peristalsis is normoactive. Extremities have no cyanosis or edema, full range of motion is present. Skin is warm and dry without rash. Neurologic: Mental status is normal, cranial nerves are intact, there are no motor or sensory deficits.  ED Results / Procedures / Treatments   Labs (all labs ordered are listed, but only abnormal results are displayed) Labs Reviewed  COMPREHENSIVE METABOLIC PANEL - Abnormal; Notable for the following components:      Result Value   Sodium 134 (*)    Chloride 95 (*)    Glucose, Bld 103 (*)    Creatinine, Ser 1.08 (*)    All other components within normal limits  CBC WITH DIFFERENTIAL/PLATELET  TROPONIN I (HIGH SENSITIVITY)  TROPONIN I (HIGH SENSITIVITY)   Radiology DG Chest Port 1 View  Result Date: 09/29/2019 CLINICAL DATA:  COVID positive. Cough. EXAM: PORTABLE CHEST 1 VIEW COMPARISON:  One-view chest x-ray 09/26/2019. FINDINGS: Low lung  volumes exaggerate the heart size. Patchy airspace opacities are worse on the left. Upper lung fields are clear. Visualized soft tissues and bony thorax are unremarkable. IMPRESSION: Patchy bilateral airspace disease, worse on the left, compatible with multifocal pneumonia. Electronically Signed   By: San Morelle M.D.   On: 09/29/2019 22:54    Procedures Procedures   Medications Ordered in ED Medications  sodium chloride 0.9 % bolus 1,000 mL (has no administration in time range)  ketorolac (TORADOL) 30 MG/ML injection  30 mg (has no administration in time range)  prochlorperazine (COMPAZINE) injection 10 mg (has no administration in time range)  acetaminophen (TYLENOL) tablet 650 mg (650 mg Oral Given 09/29/19 2214)  ondansetron (ZOFRAN-ODT) disintegrating tablet 4 mg (4 mg Oral Given 09/29/19 2214)  ondansetron (ZOFRAN) injection 4 mg (4 mg Intravenous Given 09/29/19 2331)    ED Course  I have reviewed the triage vital signs and the nursing notes.  Pertinent labs & imaging results that were available during my care of the patient were reviewed by me and considered in my medical decision making (see chart for details).  MDM Rules/Calculators/A&P COVID-19 infection with ongoing fever, dyspnea, body aches.  She is not tachycardic or tachypneic and oxygen saturation is normal on room air.  Chest x-ray shows very faint infiltrates consistent with COVID-19 pneumonia.  Labs are significant only for mild hyponatremia which is not felt to be clinically significant.  She does not meet criteria for inpatient care.  We will give IV fluids, IV prochlorperazine and ketorolac and reassess.  Old records are reviewed confirming ED visit 2 days ago for COVID-19, and antibody infusion earlier today.  She states she is not feeling any better following above-noted treatment, although nausea has improved.  She has ambulated does not show any oxygen desaturation while ambulating.  She is discharged with  prescription for prochlorperazine, advised to return to the emergency department if dyspnea is worsening.  LAILANEE ARENDS was evaluated in Emergency Department on 09/30/2019 for the symptoms described in the history of present illness. She was evaluated in the context of the global COVID-19 pandemic, which necessitated consideration that the patient might be at risk for infection with the SARS-CoV-2 virus that causes COVID-19. Institutional protocols and algorithms that pertain to the evaluation of patients at risk for COVID-19 are in a state of rapid change based on information released by regulatory bodies including the CDC and federal and state organizations. These policies and algorithms were followed during the patient's care in the ED.  Final Clinical Impression(s) / ED Diagnoses Final diagnoses:  Pneumonia due to COVID-19 virus    Rx / DC Orders ED Discharge Orders    None       Delora Fuel, MD [id number] [phone]

## 2019-09-30 NOTE — Discharge Instructions (Addendum)
Return if your breathing is getting worse/. 

## 2019-09-30 NOTE — ED Notes (Signed)
Pt SOB on ambulation, O2 sat 91-93%

## 2019-12-20 ENCOUNTER — Encounter (HOSPITAL_COMMUNITY): Payer: Self-pay | Admitting: *Deleted

## 2019-12-20 ENCOUNTER — Inpatient Hospital Stay (HOSPITAL_COMMUNITY): Payer: Medicare HMO

## 2019-12-20 ENCOUNTER — Inpatient Hospital Stay (HOSPITAL_COMMUNITY)
Admission: AD | Admit: 2019-12-20 | Discharge: 2019-12-20 | Disposition: A | Discharge status: Home / Self Care | Payer: Medicare HMO | Attending: Obstetrics and Gynecology | Admitting: Obstetrics and Gynecology

## 2019-12-20 DIAGNOSIS — G35 Multiple sclerosis: Secondary | ICD-10-CM | POA: Diagnosis not present

## 2019-12-20 DIAGNOSIS — Z3A01 Less than 8 weeks gestation of pregnancy: Secondary | ICD-10-CM | POA: Insufficient documentation

## 2019-12-20 DIAGNOSIS — O99611 Diseases of the digestive system complicating pregnancy, first trimester: Secondary | ICD-10-CM | POA: Diagnosis not present

## 2019-12-20 DIAGNOSIS — O99351 Diseases of the nervous system complicating pregnancy, first trimester: Secondary | ICD-10-CM | POA: Diagnosis not present

## 2019-12-20 DIAGNOSIS — Z3A00 Weeks of gestation of pregnancy not specified: Secondary | ICD-10-CM

## 2019-12-20 DIAGNOSIS — O26891 Other specified pregnancy related conditions, first trimester: Secondary | ICD-10-CM | POA: Diagnosis present

## 2019-12-20 DIAGNOSIS — O99211 Obesity complicating pregnancy, first trimester: Secondary | ICD-10-CM | POA: Diagnosis not present

## 2019-12-20 DIAGNOSIS — K589 Irritable bowel syndrome without diarrhea: Secondary | ICD-10-CM | POA: Insufficient documentation

## 2019-12-20 DIAGNOSIS — O3680X0 Pregnancy with inconclusive fetal viability, not applicable or unspecified: Secondary | ICD-10-CM

## 2019-12-20 DIAGNOSIS — R1031 Right lower quadrant pain: Secondary | ICD-10-CM

## 2019-12-20 DIAGNOSIS — R030 Elevated blood-pressure reading, without diagnosis of hypertension: Secondary | ICD-10-CM

## 2019-12-20 DIAGNOSIS — Z9889 Other specified postprocedural states: Secondary | ICD-10-CM | POA: Diagnosis not present

## 2019-12-20 DIAGNOSIS — Z79899 Other long term (current) drug therapy: Secondary | ICD-10-CM | POA: Insufficient documentation

## 2019-12-20 DIAGNOSIS — O26899 Other specified pregnancy related conditions, unspecified trimester: Secondary | ICD-10-CM | POA: Diagnosis not present

## 2019-12-20 LAB — ABO/RH: ABO/RH(D): A POS

## 2019-12-20 LAB — URINALYSIS, ROUTINE W REFLEX MICROSCOPIC
Bilirubin Urine: NEGATIVE
Glucose, UA: NEGATIVE mg/dL
Ketones, ur: 5 mg/dL — AB
Leukocytes,Ua: NEGATIVE
Nitrite: NEGATIVE
Protein, ur: NEGATIVE mg/dL
Specific Gravity, Urine: 1.025 (ref 1.005–1.030)
pH: 5 (ref 5.0–8.0)

## 2019-12-20 LAB — POCT PREGNANCY, URINE: Preg Test, Ur: POSITIVE — AB

## 2019-12-20 LAB — HCG, QUANTITATIVE, PREGNANCY: hCG, Beta Chain, Quant, S: 436 m[IU]/mL — ABNORMAL HIGH (ref <5)

## 2019-12-20 LAB — CBC
HCT: 36.3 % (ref 36.0–46.0)
Hemoglobin: 11.5 g/dL — ABNORMAL LOW (ref 12.0–15.0)
MCH: 30.3 pg (ref 26.0–34.0)
MCHC: 31.7 g/dL (ref 30.0–36.0)
MCV: 95.8 fL (ref 80.0–100.0)
Platelets: 456 10*3/uL — ABNORMAL HIGH (ref 150–400)
RBC: 3.79 MIL/uL — ABNORMAL LOW (ref 3.87–5.11)
RDW: 12.5 % (ref 11.5–15.5)
WBC: 5.4 10*3/uL (ref 4.0–10.5)
nRBC: 0 % (ref 0.0–0.2)

## 2019-12-20 LAB — WET PREP, GENITAL
Clue Cells Wet Prep HPF POC: NONE SEEN
Sperm: NONE SEEN
Trich, Wet Prep: NONE SEEN
Yeast Wet Prep HPF POC: NONE SEEN

## 2019-12-20 LAB — HIV ANTIBODY (ROUTINE TESTING W REFLEX): HIV Screen 4th Generation wRfx: NONREACTIVE

## 2019-12-20 NOTE — Discharge Instructions (Signed)
Return to MAU with severe vaginal bleeding or severe abdominal pain.  Patient informed that at this time the pregnancy cannot be confirmed to be in the uterus as no yolk sac is visualized on ultrasound.  Advised to return with worsening symptoms as an ectopic pregnancy cannot be ruled out at this time and this could be a life threatening condition. Pelvic rest - no tampons, no douching, no sex, nothing in the vagina. No heavy lifting or strenuous exercise. Return on Friday after 3 pm for repeat labs.

## 2019-12-20 NOTE — MAU Note (Signed)
. °  [name] is a 34 y.o. at [redacted]w[redacted]d here in MAU reporting: that she had her tubal reversed last June. + Pregnancy test June the 30th. Pt started having lower abdominal cramping today. Denies any VB LMP: 11/17/19 Onset of complaint: today Pain score: 10 Vitals:   12/20/19 1419 12/20/19 1421  BP:  (!) 163/88  Pulse:  (!) 120  Resp: 18   Temp: 98.6 F (37 C)      FHT: Lab orders placed from triage:UA/UPT

## 2019-12-20 NOTE — MAU Provider Note (Signed)
History     CSN: [id number]  Arrival date and time: 12/20/19 1351   First Provider Initiated Contact with Patient 12/20/19 1459      Chief Complaint  Patient presents with  . Abdominal Pain   HPI [name] 34 y.o. [redacted]w[redacted]d  Comes to MAU today with right lower abdominal pain and a positive pregnancy test.  She had a tubal reversal in June 2020 in [place] and this is the first pregnancy since that surgery.  She states she knows there is some pain and cramping in pregnancy but this is worse than what she has normally experienced in the past and is worried.  Pain started earlier today and there is no vaginal bleeding.  She has an elevated BP on arrival here today.  She reports her blood pressure in the past has been up and down but she has not been diagnosed with hypertension.  She does have MS and usually has her every 6 month infusion or Ocrevus, but missed the last scheduled infusion in June 2021.    Additionally complains of periodic flashes of light in her eyes.  This has happened before and she often wonders if her BP is elevated when it is happening.  OB History    Gravida  7   Para  3   Term  1   Preterm  2   AB  3   Living  4     SAB      TAB  3   Ectopic      Multiple  1   Live Births  2           Past Medical History:  Diagnosis Date  . Abnormal Pap smear    Colpo>normal  . Gonorrhea   . Headache    last migraine 01/2017  . History of IBS    watch diet, no meds  . Multiple sclerosis ([place])   . Neuromuscular disorder [facility])    diagnosed with MS this visit  . Obesity   . Ovarian cyst   . Pernicious anemia 05/02/2013  . Preterm labor    all 3 deliveries  . SVD (spontaneous vaginal delivery)    x 3  . Urinary tract infection   . Vaginal Pap smear, abnormal     Past Surgical History:  Procedure Laterality Date  . CHOLECYSTECTOMY N/A 11/25/2017   Procedure: LAPAROSCOPIC CHOLECYSTECTOMY WITH INTRAOPERATIVE CHOLANGIOGRAM;  Surgeon: Armandina Gemma,  MD;  Location: [facility];  Service: General;  Laterality: N/A;  . COLPOSCOPY    . MYOMECTOMY N/A 04/01/2017   Procedure: Vaginal Myomectomy;  Surgeon: Osborne Oman, MD;  Location: [place] [facility];  Service: Gynecology;  Laterality: N/A;  . TUBAL LIGATION     2012  . WISDOM TOOTH EXTRACTION      Family History  Problem Relation Age of Onset  . Diabetes Mother   . Hypertension Mother   . Diabetes Father   . Hypertension Father   . Anesthesia problems Neg Hx   . Other Neg Hx     Social History   Tobacco Use  . Smoking status: Never Smoker  . Smokeless tobacco: Never Used  Vaping Use  . Vaping Use: Never used  Substance Use Topics  . Alcohol use: No    Alcohol/week: 0.0 standard drinks    Comment:    . Drug use: No    Allergies: No Known Allergies  Medications Prior to Admission  Medication Sig Dispense Refill Last Dose  . norgestimate-ethinyl  estradiol (SPRINTEC 28) 0.25-35 MG-MCG tablet Take 1 tablet by mouth daily. 1 Package 11   . ocrelizumab (OCREVUS) 300 MG/10ML injection Inject 600 mg into the vein every 6 (six) months.     . ondansetron (ZOFRAN ODT) 8 MG disintegrating tablet Take 1 tablet (8 mg total) by mouth every 8 (eight) hours as needed for nausea or vomiting. 10 tablet 0   . prochlorperazine (COMPAZINE) 10 MG tablet Take 1 tablet (10 mg total) by mouth every 6 (six) hours as needed for nausea or vomiting. 20 tablet 0   . sertraline (ZOLOFT) 25 MG tablet TAKE 1 TABLET BY MOUTH EVERY DAY 90 tablet 1     Review of Systems  Constitutional: Negative for fever.  Respiratory: Negative for cough, shortness of breath and wheezing.   Gastrointestinal: Positive for abdominal pain. Negative for nausea and vomiting.  Genitourinary: Negative for dysuria, vaginal bleeding and vaginal discharge.   Physical Exam   Blood pressure (!) 163/88, pulse (!) 120, temperature 98.6 F (37 C), resp. rate 18, height 5\' 4"  (1.626 m), weight 109.8 kg, last menstrual period 11/17/2019,  unknown if currently breastfeeding.  Physical Exam Vitals and nursing note reviewed.  Constitutional:      Appearance: She is well-developed.  HENT:     Head: Normocephalic.  Abdominal:     Palpations: Abdomen is soft.     Tenderness: There is no abdominal tenderness. There is no guarding or rebound.     Comments: Mild pain with palpation in Right lower quadrant.  Worse toward the midline near the groin.  Genitourinary:    Comments: Vaginal swab done by RN Musculoskeletal:        General: Normal range of motion.     Cervical back: Neck supple.  Skin:    General: Skin is warm and dry.  Neurological:     Mental Status: She is alert and oriented to person, place, and time.   Repeat BPs are less - 140/85, 118/63, 135/86  MAU Course  Procedures  CLINICAL DATA:  History of tubal ligation and subsequent reversal. Right lower quadrant abdominal pain.  EXAM: OBSTETRIC <14 WK [place] AND TRANSVAGINAL OB US  TECHNIQUE: Both transabdominal and transvaginal ultrasound examinations were performed for complete evaluation of the gestation as well as the maternal uterus, adnexal regions, and pelvic cul-de-sac. Transvaginal technique was performed to assess early pregnancy.  COMPARISON:  None.  FINDINGS: The uterus is anteverted. The uterus measures at least 9.9 x 5.9 by 5.8 cm in greatest dimension. No intrauterine gestational sac is identified. The endometrial stripe is uniform measuring roughly 14 mm in thickness. No intrauterine masses are seen. Few nabothian cysts are seen within the cervix, however, the cervix is otherwise unremarkable.  A small amount of simple appearing free fluid is seen within the cul-de-sac.  The ovaries are normal in size and echogenicity. No adnexal masses are seen.  IMPRESSION: No intrauterine gestational sac identified. Depending on patient's clinical status, a follow-up sonogram in 10-14 days may be helpful in documenting the development of a  intrauterine gestational sac.   LABS Results for orders placed or performed during the hospital encounter of 12/20/19 (from the past 24 hour(s))  Pregnancy, urine POC     Status: Abnormal   Collection Time: 12/20/19  2:24 PM  Result Value Ref Range   Preg Test, Ur POSITIVE (A) NEGATIVE  Urinalysis, Routine w reflex microscopic     Status: Abnormal   Collection Time: 12/20/19  2:28 PM  Result Value Ref  Range   Color, Urine YELLOW YELLOW   APPearance HAZY (A) CLEAR   Specific Gravity, Urine 1.025 1.005 - 1.030   pH 5.0 5.0 - 8.0   Glucose, UA NEGATIVE NEGATIVE mg/dL   Hgb urine dipstick MODERATE (A) NEGATIVE   Bilirubin Urine NEGATIVE NEGATIVE   Ketones, ur 5 (A) NEGATIVE mg/dL   Protein, ur NEGATIVE NEGATIVE mg/dL   Nitrite NEGATIVE NEGATIVE   Leukocytes,Ua NEGATIVE NEGATIVE   RBC / HPF 6-10 0 - 5 RBC/hpf   WBC, UA 0-5 0 - 5 WBC/hpf   Bacteria, UA RARE (A) NONE SEEN   Squamous Epithelial / LPF 0-5 0 - 5   Mucus PRESENT   CBC     Status: Abnormal   Collection Time: 12/20/19  3:28 PM  Result Value Ref Range   WBC 5.4 4.0 - 10.5 K/uL   RBC 3.79 (L) 3.87 - 5.11 MIL/uL   Hemoglobin 11.5 (L) 12.0 - 15.0 g/dL   HCT 36.3 36 - 46 %   MCV 95.8 80.0 - 100.0 fL   MCH 30.3 26.0 - 34.0 pg   MCHC 31.7 30.0 - 36.0 g/dL   RDW 12.5 11.5 - 15.5 %   Platelets 456 (H) 150 - 400 K/uL   nRBC 0.0 0.0 - 0.2 %  hCG, quantitative, pregnancy     Status: Abnormal   Collection Time: 12/20/19  3:28 PM  Result Value Ref Range   hCG, Beta Chain, Quant, S 436 (H) <5 mIU/mL  ABO/Rh     Status: None   Collection Time: 12/20/19  3:28 PM  Result Value Ref Range   ABO/RH(D) A POS    No rh immune globuloin      NOT A RH IMMUNE GLOBULIN CANDIDATE, PT RH POSITIVE Performed at [facility] [address], [place], [place] [address]   Wet prep, genital     Status: Abnormal   Collection Time: 12/20/19  3:31 PM  Result Value Ref Range   Yeast Wet Prep HPF POC NONE SEEN NONE SEEN   Trich, Wet Prep  NONE SEEN NONE SEEN   Clue Cells Wet Prep HPF POC NONE SEEN NONE SEEN   WBC, Wet Prep HPF POC FEW (A) NONE SEEN   Sperm NONE SEEN     MDM Discussed with client the diagnosis of pregnancy of unknown anatomic location.  Three possibilities of outcome are: a healthy pregnancy that is too early to see a yolk sac to confirm the pregnancy is in the uterus, a pregnancy that is not healthy and has not developed and will not develop, and an ectopic pregnancy that is in the abdomen that cannot be identified at this time.  And ectopic pregnancy can be a life threatening situation as a pregnancy needs to be in the uterus which is a muscle and can stretch to accommodate the growth of a pregnancy.  Other structures in the pelvis and abdomen as not muscular and do not stretch with the growth of a pregnancy.  Worst case scenario is that a structure ruptures with a growing pregnancy not in the uterus and and internal hemorrhage can be a life threatening situation.  We need to follow the progression of this pregnancy carefully.  We need to check another serum pregnancy hormone level to determine if the levels are rising appropriately  and to determine the next steps that are needed for you.  An appointment was made for follow up on Friday evening or Saturday morning for her lab work.  Patient's questions were answered.  BP was normal with rest. Blood type A positive  Assessment and Plan  Pregnancy of unknown anatomic location complicated by history of tubal reversal one year ago Elevated BP without diagnosis of hypertension Hx of MS  Plan Return to [facility] for repeat quant on Friday after 3 pm for repeat labs. Return to MAU with severe vaginal bleeding or severe abdominal pain.  Patient informed that at this time the pregnancy cannot be confirmed to be in the uterus as no yolk sac is visualized on ultrasound.  Advised to return with worsening symptoms as an ectopic pregnancy cannot be ruled out at this time and this  could be a life threatening condition. Pelvic rest - no tampons, no douching, no sex, nothing in the vagina. No heavy lifting or strenuous exercise.   Demarques Pilz L Donyelle Enyeart 12/20/2019, 3:50 PM

## 2019-12-21 LAB — GC/CHLAMYDIA PROBE AMP (~~LOC~~) NOT AT ARMC
Chlamydia: NEGATIVE
Comment: NEGATIVE
Comment: NORMAL
Neisseria Gonorrhea: NEGATIVE

## 2019-12-21 LAB — RPR: RPR Ser Ql: NONREACTIVE

## 2019-12-24 ENCOUNTER — Inpatient Hospital Stay (HOSPITAL_COMMUNITY)
Admission: AD | Admit: 2019-12-24 | Discharge: 2019-12-24 | Disposition: A | Discharge status: Home / Self Care | Payer: Medicare HMO | Attending: Obstetrics and Gynecology | Admitting: Obstetrics and Gynecology

## 2019-12-24 ENCOUNTER — Other Ambulatory Visit: Payer: Self-pay

## 2019-12-24 ENCOUNTER — Telehealth: Payer: Self-pay | Admitting: Student

## 2019-12-24 DIAGNOSIS — G35 Multiple sclerosis: Secondary | ICD-10-CM | POA: Diagnosis not present

## 2019-12-24 DIAGNOSIS — O99351 Diseases of the nervous system complicating pregnancy, first trimester: Secondary | ICD-10-CM | POA: Diagnosis not present

## 2019-12-24 DIAGNOSIS — Z3A01 Less than 8 weeks gestation of pregnancy: Secondary | ICD-10-CM | POA: Insufficient documentation

## 2019-12-24 DIAGNOSIS — E349 Endocrine disorder, unspecified: Secondary | ICD-10-CM

## 2019-12-24 DIAGNOSIS — O3680X0 Pregnancy with inconclusive fetal viability, not applicable or unspecified: Secondary | ICD-10-CM

## 2019-12-24 LAB — HCG, QUANTITATIVE, PREGNANCY: hCG, Beta Chain, Quant, S: 2939 m[IU]/mL — ABNORMAL HIGH (ref <5)

## 2019-12-24 NOTE — MAU Provider Note (Signed)
[name]  is a 34 y.o. [id number] at [redacted]w[redacted]d who presents to the MAU today for follow-up quant hCG after >48 hours.   The patient was seen in MAU on 12/20/2019  quant hCG of 436  US showed No IUGS, YS or FP. Recommendation was for repeat HCG in 48 hours and repeat U/S in 10-12 days   She reports that she had a repeat HCG level done at [facility] on Friday 12/22/2019. She reports the number was 800 on that day. She states, "They said they would call me for f/u this week."  She denies pain Denies vaginal bleeding  Denies fever    OB History  Gravida Para Term Preterm AB Living  7 3 1 2 3 4   SAB TAB Ectopic Multiple Live Births    3   1 2     # Outcome Date GA Lbr Len/2nd Weight Sex Delivery Anes PTL Lv  7 Current           6A Preterm 08/30/10 [redacted]w[redacted]d   F Vag-Spont   LIV  6B Preterm     F    LIV  5 Preterm 05/31/08    M Vag-Spont     4 Term 06/13/03 [redacted]w[redacted]d   F Vag-Spont     3 TAB           2 TAB           1 TAB             Past Medical History:  Diagnosis Date  . Abnormal Pap smear    Colpo>normal  . Gonorrhea   . Headache    last migraine 01/2017  . History of IBS    watch diet, no meds  . Multiple sclerosis ([place])   . Neuromuscular disorder [facility])    diagnosed with MS this visit  . Obesity   . Ovarian cyst   . Pernicious anemia 05/02/2013  . Preterm labor    all 3 deliveries  . SVD (spontaneous vaginal delivery)    x 3  . Urinary tract infection   . Vaginal Pap smear, abnormal      BP 122/64 (BP Location: Right Arm)   Pulse 79   Temp 98.7 F (37.1 C) (Oral)   Resp 16   LMP 11/17/2019   SpO2 100% Comment: room air  CONSTITUTIONAL: Well-developed, well-nourished female in no acute distress.  MUSCULOSKELETAL: Normal range of motion.  CARDIOVASCULAR: Regular heart rate RESPIRATORY: Normal effort NEUROLOGICAL: Alert and oriented to person, place, and time.  SKIN: Skin is warm and dry. No rash noted. Not diaphoretic. No erythema. No pallor. PSYCH: Normal mood  and affect. Normal behavior. Normal judgment and thought content.  Results for orders placed or performed during the hospital encounter of 12/24/19 (from the past 24 hour(s))  hCG, quantitative, pregnancy     Status: Abnormal   Collection Time: 12/24/19  7:12 PM  Result Value Ref Range   hCG, Beta Chain, Quant, S 2,939 (H) <5 mIU/mL    A: Appropriate rise in quant hCG after 48 hours  P: Discharge home First trimester/ectopic precautions discussed [facility]  Patient may return to MAU as needed or if her condition were to change or worsen   Laury Deep, [facility] 12/24/2019 8:33 PM

## 2019-12-24 NOTE — Telephone Encounter (Signed)
Called patient to discuss missing blood draw in MAU on 7/9; patient will come to MAU this evening. Reviewed importance of blood draw and evaluation to rule out ectopic pregnancy. Patient verbalized understanding.   Maye Hides

## 2019-12-24 NOTE — MAU Note (Signed)
[name] is a 34 y.o. at [redacted]w[redacted]d here in MAU reporting: here for follow up hcg. Denies pain or bleeding.  Pain score: 0/10  Vitals:   12/24/19 1848  BP: 122/64  Pulse: 79  Resp: 16  Temp: 98.7 F (37.1 C)  SpO2: 100%     Lab orders placed from triage: hcg

## 2020-01-05 ENCOUNTER — Inpatient Hospital Stay (HOSPITAL_COMMUNITY)
Admission: AD | Admit: 2020-01-05 | Discharge: 2020-01-05 | Disposition: A | Discharge status: Home / Self Care | Payer: Medicare HMO | Attending: Obstetrics and Gynecology | Admitting: Obstetrics and Gynecology

## 2020-01-05 ENCOUNTER — Encounter (HOSPITAL_COMMUNITY): Payer: Self-pay | Admitting: Obstetrics and Gynecology

## 2020-01-05 ENCOUNTER — Inpatient Hospital Stay (HOSPITAL_COMMUNITY): Payer: Medicare HMO

## 2020-01-05 ENCOUNTER — Other Ambulatory Visit: Payer: Self-pay

## 2020-01-05 DIAGNOSIS — O211 Hyperemesis gravidarum with metabolic disturbance: Secondary | ICD-10-CM | POA: Diagnosis not present

## 2020-01-05 DIAGNOSIS — O210 Mild hyperemesis gravidarum: Secondary | ICD-10-CM | POA: Diagnosis not present

## 2020-01-05 DIAGNOSIS — Z79899 Other long term (current) drug therapy: Secondary | ICD-10-CM | POA: Diagnosis not present

## 2020-01-05 DIAGNOSIS — G35 Multiple sclerosis: Secondary | ICD-10-CM | POA: Insufficient documentation

## 2020-01-05 DIAGNOSIS — Z3A01 Less than 8 weeks gestation of pregnancy: Secondary | ICD-10-CM | POA: Insufficient documentation

## 2020-01-05 DIAGNOSIS — O4691 Antepartum hemorrhage, unspecified, first trimester: Secondary | ICD-10-CM

## 2020-01-05 DIAGNOSIS — O99351 Diseases of the nervous system complicating pregnancy, first trimester: Secondary | ICD-10-CM | POA: Insufficient documentation

## 2020-01-05 DIAGNOSIS — O4690 Antepartum hemorrhage, unspecified, unspecified trimester: Secondary | ICD-10-CM

## 2020-01-05 DIAGNOSIS — K589 Irritable bowel syndrome without diarrhea: Secondary | ICD-10-CM | POA: Diagnosis not present

## 2020-01-05 DIAGNOSIS — O209 Hemorrhage in early pregnancy, unspecified: Secondary | ICD-10-CM | POA: Diagnosis not present

## 2020-01-05 DIAGNOSIS — O99611 Diseases of the digestive system complicating pregnancy, first trimester: Secondary | ICD-10-CM | POA: Diagnosis not present

## 2020-01-05 DIAGNOSIS — O99281 Endocrine, nutritional and metabolic diseases complicating pregnancy, first trimester: Secondary | ICD-10-CM | POA: Insufficient documentation

## 2020-01-05 DIAGNOSIS — O99211 Obesity complicating pregnancy, first trimester: Secondary | ICD-10-CM | POA: Insufficient documentation

## 2020-01-05 DIAGNOSIS — E860 Dehydration: Secondary | ICD-10-CM | POA: Insufficient documentation

## 2020-01-05 DIAGNOSIS — Z87440 Personal history of urinary (tract) infections: Secondary | ICD-10-CM | POA: Diagnosis not present

## 2020-01-05 DIAGNOSIS — E669 Obesity, unspecified: Secondary | ICD-10-CM | POA: Insufficient documentation

## 2020-01-05 LAB — URINALYSIS, ROUTINE W REFLEX MICROSCOPIC
Bilirubin Urine: NEGATIVE
Glucose, UA: NEGATIVE mg/dL
Ketones, ur: NEGATIVE mg/dL
Leukocytes,Ua: NEGATIVE
Nitrite: NEGATIVE
Protein, ur: 100 mg/dL — AB
Specific Gravity, Urine: 1.028 (ref 1.005–1.030)
pH: 5 (ref 5.0–8.0)

## 2020-01-05 LAB — CBC
HCT: 37 % (ref 36.0–46.0)
Hemoglobin: 11.7 g/dL — ABNORMAL LOW (ref 12.0–15.0)
MCH: 29.8 pg (ref 26.0–34.0)
MCHC: 31.6 g/dL (ref 30.0–36.0)
MCV: 94.4 fL (ref 80.0–100.0)
Platelets: 392 10*3/uL (ref 150–400)
RBC: 3.92 MIL/uL (ref 3.87–5.11)
RDW: 12.3 % (ref 11.5–15.5)
WBC: 8.1 10*3/uL (ref 4.0–10.5)
nRBC: 0 % (ref 0.0–0.2)

## 2020-01-05 LAB — HCG, QUANTITATIVE, PREGNANCY: hCG, Beta Chain, Quant, S: 56698 m[IU]/mL — ABNORMAL HIGH (ref <5)

## 2020-01-05 MED ORDER — SCOPOLAMINE 1 MG/3DAYS TD PT72: 1.0000 | MEDICATED_PATCH | TRANSDERMAL | 0 refills | Status: DC

## 2020-01-05 MED ORDER — SCOPOLAMINE 1 MG/3DAYS TD PT72
1.0000 | MEDICATED_PATCH | TRANSDERMAL | Status: DC
  Administered 2020-01-05: 1.5 mg via TRANSDERMAL
  Filled 2020-01-05: qty 1

## 2020-01-05 NOTE — Discharge Instructions (Signed)
Morning Sickness  Morning sickness is when you feel sick to your stomach (nauseous) during pregnancy. You may feel sick to your stomach and throw up (vomit). You may feel sick in the morning, but you can feel this way at any time of day. Some women feel very sick to their stomach and cannot stop throwing up (hyperemesis gravidarum). Follow these instructions at home: Medicines  Take over-the-counter and prescription medicines only as told by your doctor. Do not take any medicines until you talk with your doctor about them first.  Taking multivitamins before getting pregnant can stop or lessen the harshness of morning sickness. Eating and drinking  Eat dry toast or crackers before getting out of bed.  Eat 5 or 6 small meals a day.  Eat dry and bland foods like rice and baked potatoes.  Do not eat greasy, fatty, or spicy foods.  Have someone cook for you if the smell of food causes you to feel sick or throw up.  If you feel sick to your stomach after taking prenatal vitamins, take them at night or with a snack.  Eat protein when you need a snack. Nuts, yogurt, and cheese are good choices.  Drink fluids throughout the day.  Try ginger ale made with real ginger, ginger tea made from fresh grated ginger, or ginger candies. General instructions  Do not use any products that have nicotine or tobacco in them, such as cigarettes and e-cigarettes. If you need help quitting, ask your doctor.  Use an air purifier to keep the air in your house free of smells.  Get lots of fresh air.  Try to avoid smells that make you feel sick.  Try: ? Wearing a bracelet that is used for seasickness (acupressure wristband). ? Going to a doctor who puts thin needles into certain body points (acupuncture) to improve how you feel. Contact a doctor if:  You need medicine to feel better.  You feel dizzy or light-headed.  You are losing weight. Get help right away if:  You feel very sick to your  stomach and cannot stop throwing up.  You pass out (faint).  You have very bad pain in your belly. Summary  Morning sickness is when you feel sick to your stomach (nauseous) during pregnancy.  You may feel sick in the morning, but you can feel this way at any time of day.  Making some changes to what you eat may help your symptoms go away. This information is not intended to replace advice given to you by your health care provider. Make sure you discuss any questions you have with your health care provider. Document Revised: 05/14/2017 Document Reviewed: 07/02/2016 Elsevier Patient Education  [place].   Abdominal Pain During Pregnancy  Belly (abdominal) pain is common during pregnancy. There are many possible causes. Most of the time, it is not a serious problem. Other times, it can be a sign that something is wrong with the pregnancy. Always tell your doctor if you have belly pain. Follow these instructions at home:  Do not have sex or put anything in your vagina until your pain goes away completely.  Get plenty of rest until your pain gets better.  Drink enough fluid to keep your pee (urine) pale yellow.  Take over-the-counter and prescription medicines only as told by your doctor.  Keep all follow-up visits as told by your doctor. This is important. Contact a doctor if:  Your pain continues or gets worse after resting.  You have  lower belly pain that: ? Comes and goes at regular times. ? Spreads to your back. ? Feels like menstrual cramps.  You have pain or burning when you pee (urinate). Get help right away if:  You have a fever or chills.  You have vaginal bleeding.  You are leaking fluid from your vagina.  You are passing tissue from your vagina.  You throw up (vomit) for more than 24 hours.  You have watery poop (diarrhea) for more than 24 hours.  Your baby is moving less than usual.  You feel very weak or faint.  You have shortness of  breath.  You have very bad pain in your upper belly. Summary  Belly (abdominal) pain is common during pregnancy. There are many possible causes.  If you have belly pain during pregnancy, tell your doctor right away.  Keep all follow-up visits as told by your doctor. This is important. This information is not intended to replace advice given to you by your health care provider. Make sure you discuss any questions you have with your health care provider. Document Revised: 09/19/2018 Document Reviewed: 09/03/2016 Elsevier Patient Education  [place].

## 2020-01-05 NOTE — MAU Provider Note (Addendum)
History     CSN: [id number]  Arrival date and time: 01/05/20 1744   First Provider Initiated Contact with Patient 01/05/20 1853      Chief Complaint  Patient presents with  . Vaginal Bleeding  . Abdominal Pain   34 y.o. [id number] @[redacted]w[redacted]d  by LMP presenting with LAP. Pain started this am. Describes as cramping in bilateral low abdomen. Rates pain 7/10. Has not tried anything for it. She also saw a brown mucus discharge today. No recent sex. No urinary sx. Reports N/V today. States Rx for Phenergan was sent in but she hasn't picked it up.  OB History    Gravida  7   Para  3   Term  1   Preterm  2   AB  3   Living  4     SAB      TAB  3   Ectopic      Multiple  1   Live Births  2           Past Medical History:  Diagnosis Date  . Abnormal Pap smear    Colpo>normal  . Gonorrhea   . Headache    last migraine 01/2017  . History of IBS    watch diet, no meds  . Multiple sclerosis ([place])   . Neuromuscular disorder [facility])    diagnosed with MS this visit  . Obesity   . Ovarian cyst   . Pernicious anemia 05/02/2013  . Preterm labor    all 3 deliveries  . SVD (spontaneous vaginal delivery)    x 3  . Urinary tract infection   . Vaginal Pap smear, abnormal     Past Surgical History:  Procedure Laterality Date  . CHOLECYSTECTOMY N/A 11/25/2017   Procedure: LAPAROSCOPIC CHOLECYSTECTOMY WITH INTRAOPERATIVE CHOLANGIOGRAM;  Surgeon: Armandina Gemma, MD;  Location: [facility];  Service: General;  Laterality: N/A;  . COLPOSCOPY    . MYOMECTOMY N/A 04/01/2017   Procedure: Vaginal Myomectomy;  Surgeon: Osborne Oman, MD;  Location: [place] [facility];  Service: Gynecology;  Laterality: N/A;  . TUBAL LIGATION     2012  . WISDOM TOOTH EXTRACTION      Family History  Problem Relation Age of Onset  . Diabetes Mother   . Hypertension Mother   . Diabetes Father   . Hypertension Father   . Anesthesia problems Neg Hx   . Other Neg Hx     Social History   Tobacco Use  . Smoking  status: Never Smoker  . Smokeless tobacco: Never Used  Vaping Use  . Vaping Use: Never used  Substance Use Topics  . Alcohol use: No    Alcohol/week: 0.0 standard drinks    Comment:    . Drug use: No    Allergies: No Known Allergies  Medications Prior to Admission  Medication Sig Dispense Refill Last Dose  . prochlorperazine (COMPAZINE) 10 MG tablet Take 1 tablet (10 mg total) by mouth every 6 (six) hours as needed for nausea or vomiting. 20 tablet 0 More than a month at Unknown time  . sertraline (ZOLOFT) 25 MG tablet TAKE 1 TABLET BY MOUTH EVERY DAY 90 tablet 1 More than a month at Unknown time    Review of Systems  Gastrointestinal: Positive for abdominal pain, nausea and vomiting. Negative for constipation and diarrhea.  Genitourinary: Positive for vaginal discharge. Negative for dysuria, frequency, urgency and vaginal bleeding.   Physical Exam   Blood pressure (!) 140/81, pulse 93, temperature 98.8 F (37.1  C), temperature source Oral, resp. rate 18, height 5\' 4"  (1.626 m), weight (!) 109.1 kg, last menstrual period 11/17/2019, SpO2 100 %, unknown if currently breastfeeding.  Physical Exam Vitals and nursing note reviewed. Exam conducted with a chaperone present.  Constitutional:      General: She is not in acute distress (appears comfortable). HENT:     Head: Normocephalic and atraumatic.  Cardiovascular:     Rate and Rhythm: Normal rate.  Pulmonary:     Effort: Pulmonary effort is normal. No respiratory distress.  Abdominal:     General: There is no distension.     Palpations: Abdomen is soft.     Tenderness: There is no abdominal tenderness. There is no guarding or rebound.  Skin:    General: Skin is warm and dry.  Neurological:     General: No focal deficit present.     Mental Status: She is alert and oriented to person, place, and time.  Psychiatric:        Mood and Affect: Mood normal.    Results for orders placed or performed during the hospital  encounter of 01/05/20 (from the past 24 hour(s))  Urinalysis, Routine w reflex microscopic     Status: Abnormal   Collection Time: 01/05/20  6:30 PM  Result Value Ref Range   Color, Urine YELLOW YELLOW   APPearance HAZY (A) CLEAR   Specific Gravity, Urine 1.028 1.005 - 1.030   pH 5.0 5.0 - 8.0   Glucose, UA NEGATIVE NEGATIVE mg/dL   Hgb urine dipstick MODERATE (A) NEGATIVE   Bilirubin Urine NEGATIVE NEGATIVE   Ketones, ur NEGATIVE NEGATIVE mg/dL   Protein, ur 100 (A) NEGATIVE mg/dL   Nitrite NEGATIVE NEGATIVE   Leukocytes,Ua NEGATIVE NEGATIVE   RBC / HPF 11-20 0 - 5 RBC/hpf   WBC, UA 0-5 0 - 5 WBC/hpf   Bacteria, UA RARE (A) NONE SEEN   Squamous Epithelial / LPF 0-5 0 - 5   Mucus PRESENT    Hyaline Casts, UA PRESENT   hCG, quantitative, pregnancy     Status: Abnormal   Collection Time: 01/05/20  7:00 PM  Result Value Ref Range   hCG, Beta Chain, Quant, S 56,698 (H) <5 mIU/mL  CBC     Status: Abnormal   Collection Time: 01/05/20  7:08 PM  Result Value Ref Range   WBC 8.1 4.0 - 10.5 K/uL   RBC 3.92 3.87 - 5.11 MIL/uL   Hemoglobin 11.7 (L) 12.0 - 15.0 g/dL   HCT 37.0 36 - 46 %   MCV 94.4 80.0 - 100.0 fL   MCH 29.8 26.0 - 34.0 pg   MCHC 31.6 30.0 - 36.0 g/dL   RDW 12.3 11.5 - 15.5 %   Platelets 392 150 - 400 K/uL   nRBC 0.0 0.0 - 0.2 %   US OB Transvaginal  Result Date: 01/05/2020 CLINICAL DATA:  Vaginal bleeding EXAM: TRANSVAGINAL OB ULTRASOUND TECHNIQUE: Transvaginal ultrasound was performed for complete evaluation of the gestation as well as the maternal uterus, adnexal regions, and pelvic cul-de-sac. COMPARISON:  12/20/2019 FINDINGS: Intrauterine gestational sac: Present, single Yolk sac:  Not identified Embryo:  Present, single Cardiac Activity: Present Heart Rate: 133 bpm MSD:   Appropriate in relation to the size of the fetus CRL:   12.5 mm   7 w in 3 d                  [place] EDC: 08/20/2020 Subchorionic hemorrhage:  None visualized. Maternal uterus/adnexae: The  uterus is  anteverted. No intrauterine masses are seen. The cervix is closed though not well assessed on this examination. The placenta is not well visualized given the early phase of gestation. There is no free fluid within the cul-de-sac. The maternal ovaries are normal in size and echogenicity. IMPRESSION: Interval development of an intrauterine gestational sac and a single fetus. Single living fetus estimated gestational age [redacted] weeks 3 days with expected date of delivery of 08/20/2020. Electronically Signed   By: Fidela Salisbury MD   On: 01/05/2020 20:03   MAU Course  Procedures Scopolamine  MDM Labs and [place] ordered and reviewed. Normal IUP on [place]. Pain likely d/t N/V. Feeling better, no emesis, tolerating po. Stable for discharge home.   Assessment and Plan   1. [redacted] weeks gestation of pregnancy   2. Vaginal bleeding in pregnancy   3. Morning sickness   4. Dehydration    Discharge home Follow up at [facility] next week as scheduled SAB precautions Rx Scopolamine Phenergan prn- has Rx  Allergies as of 01/05/2020   No Known Allergies     Medication List    STOP taking these medications   prochlorperazine 10 MG tablet Commonly known as: COMPAZINE     TAKE these medications   scopolamine 1 MG/3DAYS Commonly known as: TRANSDERM-SCOP Place 1 patch (1.5 mg total) onto the skin every 3 (three) days. Start taking on: January 08, 2020   sertraline 25 MG tablet Commonly known as: ZOLOFT TAKE 1 TABLET BY MOUTH EVERY DAY      Julianne Handler, CNM 01/05/2020, 8:19 PM

## 2020-01-05 NOTE — MAU Note (Signed)
This morning when she got up, there was some brown spotting when she wiped. Started cramping. Feels like "a balling up, not like gas cramps".

## 2020-01-14 ENCOUNTER — Inpatient Hospital Stay (HOSPITAL_COMMUNITY)
Admission: AD | Admit: 2020-01-14 | Discharge: 2020-01-15 | Disposition: A | Discharge status: Home / Self Care | Payer: Medicare HMO | Attending: Obstetrics | Admitting: Obstetrics

## 2020-01-14 ENCOUNTER — Other Ambulatory Visit: Payer: Self-pay

## 2020-01-14 ENCOUNTER — Encounter (HOSPITAL_COMMUNITY): Payer: Self-pay | Admitting: Obstetrics

## 2020-01-14 DIAGNOSIS — O26891 Other specified pregnancy related conditions, first trimester: Secondary | ICD-10-CM | POA: Insufficient documentation

## 2020-01-14 DIAGNOSIS — O99351 Diseases of the nervous system complicating pregnancy, first trimester: Secondary | ICD-10-CM | POA: Insufficient documentation

## 2020-01-14 DIAGNOSIS — R112 Nausea with vomiting, unspecified: Secondary | ICD-10-CM | POA: Diagnosis not present

## 2020-01-14 DIAGNOSIS — Z9049 Acquired absence of other specified parts of digestive tract: Secondary | ICD-10-CM | POA: Diagnosis not present

## 2020-01-14 DIAGNOSIS — O99611 Diseases of the digestive system complicating pregnancy, first trimester: Secondary | ICD-10-CM | POA: Insufficient documentation

## 2020-01-14 DIAGNOSIS — Z3A08 8 weeks gestation of pregnancy: Secondary | ICD-10-CM | POA: Diagnosis not present

## 2020-01-14 DIAGNOSIS — K589 Irritable bowel syndrome without diarrhea: Secondary | ICD-10-CM | POA: Insufficient documentation

## 2020-01-14 DIAGNOSIS — O09521 Supervision of elderly multigravida, first trimester: Secondary | ICD-10-CM | POA: Insufficient documentation

## 2020-01-14 DIAGNOSIS — Z79899 Other long term (current) drug therapy: Secondary | ICD-10-CM | POA: Diagnosis not present

## 2020-01-14 DIAGNOSIS — Z87440 Personal history of urinary (tract) infections: Secondary | ICD-10-CM | POA: Diagnosis not present

## 2020-01-14 DIAGNOSIS — R634 Abnormal weight loss: Secondary | ICD-10-CM | POA: Insufficient documentation

## 2020-01-14 DIAGNOSIS — O99211 Obesity complicating pregnancy, first trimester: Secondary | ICD-10-CM | POA: Diagnosis not present

## 2020-01-14 DIAGNOSIS — E669 Obesity, unspecified: Secondary | ICD-10-CM | POA: Insufficient documentation

## 2020-01-14 DIAGNOSIS — G35 Multiple sclerosis: Secondary | ICD-10-CM | POA: Insufficient documentation

## 2020-01-14 DIAGNOSIS — O219 Vomiting of pregnancy, unspecified: Secondary | ICD-10-CM | POA: Diagnosis not present

## 2020-01-14 HISTORY — DX: Gestational (pregnancy-induced) hypertension without significant proteinuria, unspecified trimester: O13.9

## 2020-01-14 LAB — CBC
HCT: 36.4 % (ref 36.0–46.0)
Hemoglobin: 11.8 g/dL — ABNORMAL LOW (ref 12.0–15.0)
MCH: 30.3 pg (ref 26.0–34.0)
MCHC: 32.4 g/dL (ref 30.0–36.0)
MCV: 93.6 fL (ref 80.0–100.0)
Platelets: 390 10*3/uL (ref 150–400)
RBC: 3.89 MIL/uL (ref 3.87–5.11)
RDW: 12 % (ref 11.5–15.5)
WBC: 5.9 10*3/uL (ref 4.0–10.5)
nRBC: 0 % (ref 0.0–0.2)

## 2020-01-14 LAB — RAPID URINE DRUG SCREEN, HOSP PERFORMED
Amphetamines: NOT DETECTED
Barbiturates: NOT DETECTED
Benzodiazepines: NOT DETECTED
Cocaine: NOT DETECTED
Opiates: NOT DETECTED
Tetrahydrocannabinol: NOT DETECTED

## 2020-01-14 LAB — URINALYSIS, ROUTINE W REFLEX MICROSCOPIC
Bilirubin Urine: NEGATIVE
Glucose, UA: NEGATIVE mg/dL
Ketones, ur: 20 mg/dL — AB
Leukocytes,Ua: NEGATIVE
Nitrite: NEGATIVE
Protein, ur: 30 mg/dL — AB
Specific Gravity, Urine: 1.03 (ref 1.005–1.030)
pH: 5 (ref 5.0–8.0)

## 2020-01-14 MED ORDER — METOCLOPRAMIDE HCL 5 MG/ML IJ SOLN
10.0000 mg | Freq: Once | INTRAMUSCULAR | Status: AC
  Administered 2020-01-15: 10 mg via INTRAVENOUS
  Filled 2020-01-14 (×2): qty 2

## 2020-01-14 MED ORDER — DIPHENHYDRAMINE HCL 50 MG/ML IJ SOLN
25.0000 mg | Freq: Once | INTRAMUSCULAR | Status: AC
  Administered 2020-01-15: 25 mg via INTRAVENOUS
  Filled 2020-01-14 (×2): qty 1

## 2020-01-14 MED ORDER — LACTATED RINGERS IV BOLUS
1000.0000 mL | Freq: Once | INTRAVENOUS | Status: AC
  Administered 2020-01-15: 1000 mL via INTRAVENOUS

## 2020-01-14 NOTE — MAU Note (Signed)
Pt here with reports of nausea and vomiting that started about 1.5 weeks ago. Reports she has vomited over 10 times. States she can't keep anything down, not even water. None of her medications are working. Took one of her pills an hour ago and as soon as she took it, it came up. Pt denies pain or bleeding.

## 2020-01-15 DIAGNOSIS — O26891 Other specified pregnancy related conditions, first trimester: Secondary | ICD-10-CM | POA: Diagnosis not present

## 2020-01-15 DIAGNOSIS — O219 Vomiting of pregnancy, unspecified: Secondary | ICD-10-CM

## 2020-01-15 DIAGNOSIS — Z3A08 8 weeks gestation of pregnancy: Secondary | ICD-10-CM

## 2020-01-15 LAB — COMPREHENSIVE METABOLIC PANEL
ALT: 12 U/L (ref 0–44)
AST: 15 U/L (ref 15–41)
Albumin: 3.7 g/dL (ref 3.5–5.0)
Alkaline Phosphatase: 39 U/L (ref 38–126)
Anion gap: 9 (ref 5–15)
BUN: 5 mg/dL — ABNORMAL LOW (ref 6–20)
CO2: 24 mmol/L (ref 22–32)
Calcium: 9.4 mg/dL (ref 8.9–10.3)
Chloride: 101 mmol/L (ref 98–111)
Creatinine, Ser: 0.98 mg/dL (ref 0.44–1.00)
GFR calc Af Amer: >60 mL/min (ref >60)
GFR calc non Af Amer: >60 mL/min (ref >60)
Glucose, Bld: 86 mg/dL (ref 70–99)
Potassium: 3.6 mmol/L (ref 3.5–5.1)
Sodium: 134 mmol/L — ABNORMAL LOW (ref 135–145)
Total Bilirubin: 0.8 mg/dL (ref 0.3–1.2)
Total Protein: 7.1 g/dL (ref 6.5–8.1)

## 2020-01-15 MED ORDER — METOCLOPRAMIDE HCL 10 MG PO TABS
10.0000 mg | ORAL_TABLET | Freq: Four times a day (QID) | ORAL | 2 refills | Status: DC | PRN
Start: 2020-01-15 — End: 2020-01-30

## 2020-01-15 MED ORDER — DIPHENHYDRAMINE HCL 25 MG PO TABS
25.0000 mg | ORAL_TABLET | Freq: Four times a day (QID) | ORAL | 0 refills | Status: DC | PRN
Start: 2020-01-15 — End: 2020-01-23

## 2020-01-15 NOTE — Discharge Instructions (Signed)
Hyperemesis Gravidarum Hyperemesis gravidarum is a severe form of nausea and vomiting that happens during pregnancy. Hyperemesis is worse than morning sickness. It may cause you to have nausea or vomiting all day for many days. It may keep you from eating and drinking enough food and liquids, which can lead to dehydration, malnutrition, and weight loss. Hyperemesis usually occurs during the first half (the first 20 weeks) of pregnancy. It often goes away once a woman is in her second half of pregnancy. However, sometimes hyperemesis continues through an entire pregnancy. What are the causes? The cause of this condition is not known. It may be related to changes in chemicals (hormones) in the body during pregnancy, such as the high level of pregnancy hormone (human chorionic gonadotropin) or the increase in the female sex hormone (estrogen). What are the signs or symptoms? Symptoms of this condition include:  Nausea that does not go away.  Vomiting that does not allow you to keep any food down.  Weight loss.  Body fluid loss (dehydration).  Having no desire to eat, or not liking food that you have previously enjoyed. How is this diagnosed? This condition may be diagnosed based on:  A physical exam.  Your medical history.  Your symptoms.  Blood tests.  Urine tests. How is this treated? This condition is managed by controlling symptoms. This may include:  Following an eating plan. This can help lessen nausea and vomiting.  Taking prescription medicines. An eating plan and medicines are often used together to help control symptoms. If medicines do not help relieve nausea and vomiting, you may need to receive fluids through an IV at the hospital. Follow these instructions at home: Eating and drinking   Avoid the following: ? Drinking fluids with meals. Try not to drink anything during the 30 minutes before and after your meals. ? Drinking more than 1 cup of fluid at a  time. ? Eating foods that trigger your symptoms. These may include spicy foods, coffee, high-fat foods, very sweet foods, and acidic foods. ? Skipping meals. Nausea can be more intense on an empty stomach. If you cannot tolerate food, do not force it. Try sucking on ice chips or other frozen items and make up for missed calories later. ? Lying down within 2 hours after eating. ? Being exposed to environmental triggers. These may include food smells, smoky rooms, closed spaces, rooms with strong smells, warm or humid places, overly loud and noisy rooms, and rooms with motion or flickering lights. Try eating meals in a well-ventilated area that is free of strong smells. ? Quick and sudden changes in your movement. ? Taking iron pills and multivitamins that contain iron. If you take prescription iron pills, do not stop taking them unless your health care provider approves. ? Preparing food. The smell of food can spoil your appetite or trigger nausea.  To help relieve your symptoms: ? Listen to your body. Everyone is different and has different preferences. Find what works best for you. ? Eat and drink slowly. ? Eat 5-6 small meals daily instead of 3 large meals. Eating small meals and snacks can help you avoid an empty stomach. ? In the morning, before getting out of bed, eat a couple of crackers to avoid moving around on an empty stomach. ? Try eating starchy foods as these are usually tolerated well. Examples include cereal, toast, bread, potatoes, pasta, rice, and pretzels. ? Include at least 1 serving of protein with your meals and snacks. Protein options include   lean meats, poultry, seafood, beans, nuts, nut butters, eggs, cheese, and yogurt. ? Try eating a protein-rich snack before bed. Examples of a protein-rick snack include cheese and crackers or a peanut butter sandwich made with 1 slice of whole-wheat bread and 1 tsp (5 g) of peanut butter. ? Eat or suck on things that have ginger in them.  It may help relieve nausea. Add  tsp ground ginger to hot tea or choose ginger tea. ? Try drinking 100% fruit juice or an electrolyte drink. An electrolyte drink contains sodium, potassium, and chloride. ? Drink fluids that are cold, clear, and carbonated or sour. Examples include lemonade, ginger ale, lemon-lime soda, ice water, and sparkling water. ? Brush your teeth or use a mouth rinse after meals. ? Talk with your health care provider about starting a supplement of vitamin B6. General instructions  Take over-the-counter and prescription medicines only as told by your health care provider.  Follow instructions from your health care provider about eating or drinking restrictions.  Continue to take your prenatal vitamins as told by your health care provider. If you are having trouble taking your prenatal vitamins, talk with your health care provider about different options.  Keep all follow-up and pre-birth (prenatal) visits as told by your health care provider. This is important. Contact a health care provider if:  You have pain in your abdomen.  You have a severe headache.  You have vision problems.  You are losing weight.  You feel weak or dizzy. Get help right away if:  You cannot drink fluids without vomiting.  You vomit blood.  You have constant nausea and vomiting.  You are very weak.  You faint.  You have a fever and your symptoms suddenly get worse. Summary  Hyperemesis gravidarum is a severe form of nausea and vomiting that happens during pregnancy.  Making some changes to your eating habits may help relieve nausea and vomiting.  This condition may be managed with medicine.  If medicines do not help relieve nausea and vomiting, you may need to receive fluids through an IV at the hospital. This information is not intended to replace advice given to you by your health care provider. Make sure you discuss any questions you have with your health care  provider. Document Revised: 06/21/2017 Document Reviewed: 01/29/2016 Elsevier Patient Education  2020 [employer]  

## 2020-01-15 NOTE — MAU Provider Note (Signed)
History     CSN: [id number]  Arrival date and time: 01/14/20 2206   First Provider Initiated Contact with Patient 01/14/20 2324      Chief Complaint  Patient presents with  . Nausea  . Emesis   HPI [name] is a 34 y.o. [id number] at [redacted]w[redacted]d who presents to MAU with chief complaint of severe recurrent vomiting in first trimester. Patient states this is normal for each of her pregnancies and she typically ends up with an NG tube and hospital admission. Patient states she has been unable to tolerate PO for the past week. She has attempted management with Phenergan and a Scopolamine patch but has not experienced success. She estimates that she vomits 10 times per day.  OB History    Gravida  7   Para  3   Term  1   Preterm  2   AB  3   Living  4     SAB      TAB  3   Ectopic      Multiple  1   Live Births  2           Past Medical History:  Diagnosis Date  . Abnormal Pap smear    Colpo>normal  . Gonorrhea   . Headache    last migraine 01/2017  . History of IBS    watch diet, no meds  . Multiple sclerosis ([place])   . Neuromuscular disorder [facility])    diagnosed with MS this visit  . Obesity   . Ovarian cyst   . Pernicious anemia 05/02/2013  . Pregnancy induced hypertension   . Preterm labor    all 3 deliveries  . SVD (spontaneous vaginal delivery)    x 3  . Urinary tract infection   . Vaginal Pap smear, abnormal     Past Surgical History:  Procedure Laterality Date  . CHOLECYSTECTOMY N/A 11/25/2017   Procedure: LAPAROSCOPIC CHOLECYSTECTOMY WITH INTRAOPERATIVE CHOLANGIOGRAM;  Surgeon: Armandina Gemma, MD;  Location: [facility];  Service: General;  Laterality: N/A;  . COLPOSCOPY    . MYOMECTOMY N/A 04/01/2017   Procedure: Vaginal Myomectomy;  Surgeon: Osborne Oman, MD;  Location: [facility];  Service: Gynecology;  Laterality: N/A;  . TUBAL LIGATION     2012  . WISDOM TOOTH EXTRACTION      Family History  Problem Relation Age of Onset  . Diabetes  Mother   . Hypertension Mother   . Diabetes Father   . Hypertension Father   . Anesthesia problems Neg Hx   . Other Neg Hx     Social History   Tobacco Use  . Smoking status: Never Smoker  . Smokeless tobacco: Never Used  Vaping Use  . Vaping Use: Never used  Substance Use Topics  . Alcohol use: No    Alcohol/week: 0.0 standard drinks    Comment:    . Drug use: No    Allergies: No Known Allergies  Medications Prior to Admission  Medication Sig Dispense Refill Last Dose  . ondansetron (ZOFRAN-ODT) 4 MG disintegrating tablet Take 4 mg by mouth every 8 (eight) hours as needed for nausea or vomiting.     . promethazine (PHENERGAN) 25 MG tablet Take 25 mg by mouth every 6 (six) hours as needed for nausea or vomiting.     Marland Kitchen scopolamine (TRANSDERM-SCOP) 1 MG/3DAYS Place 1 patch (1.5 mg total) onto the skin every 3 (three) days. 10 patch 0   . sertraline (ZOLOFT) 25 MG  tablet TAKE 1 TABLET BY MOUTH EVERY DAY 90 tablet 1     Review of Systems  Constitutional: Positive for fatigue.  Gastrointestinal: Positive for nausea and vomiting.  All other systems reviewed and are negative.  Physical Exam   Blood pressure 117/71, pulse 66, temperature 98.8 F (37.1 C), temperature source Oral, resp. rate 20, height 5\' 4"  (1.626 m), weight (!) 106.7 kg, last menstrual period 11/17/2019, SpO2 100 %, unknown if currently breastfeeding.  Physical Exam Vitals and nursing note reviewed. Exam conducted with a chaperone present.  Cardiovascular:     Rate and Rhythm: Normal rate.     Pulses: Normal pulses.     Heart sounds: Normal heart sounds.  Pulmonary:     Effort: Pulmonary effort is normal.     Breath sounds: Normal breath sounds.  Abdominal:     General: Abdomen is flat. Bowel sounds are normal.     Tenderness: There is no right CVA tenderness, left CVA tenderness, guarding or rebound.  Skin:    General: Skin is warm and dry.     Capillary Refill: Capillary refill takes less than 2  seconds.  Neurological:     General: No focal deficit present.     MAU Course  Procedures  Orders Placed This Encounter  Procedures  . Urinalysis, Routine w reflex microscopic  . Rapid urine drug screen (hospital performed)  . CBC  . Comprehensive metabolic panel  . Insert peripheral IV   Patient Vitals for the past 24 hrs:  BP Temp Temp src Pulse Resp SpO2 Height Weight  01/15/20 0405 (!) 106/56 -- -- 66 18 -- -- --  01/14/20 2258 117/71 98.8 F (37.1 C) Oral 66 20 100 % 5\' 4"  (1.626 m) (!) 106.7 kg   Results for orders placed or performed during the hospital encounter of 01/14/20 (from the past 24 hour(s))  Urinalysis, Routine w reflex microscopic     Status: Abnormal   Collection Time: 01/14/20 11:06 PM  Result Value Ref Range   Color, Urine AMBER (A) YELLOW   APPearance CLEAR CLEAR   Specific Gravity, Urine 1.030 1.005 - 1.030   pH 5.0 5.0 - 8.0   Glucose, UA NEGATIVE NEGATIVE mg/dL   Hgb urine dipstick MODERATE (A) NEGATIVE   Bilirubin Urine NEGATIVE NEGATIVE   Ketones, ur 20 (A) NEGATIVE mg/dL   Protein, ur 30 (A) NEGATIVE mg/dL   Nitrite NEGATIVE NEGATIVE   Leukocytes,Ua NEGATIVE NEGATIVE   RBC / HPF 11-20 0 - 5 RBC/hpf   WBC, UA 0-5 0 - 5 WBC/hpf   Bacteria, UA RARE (A) NONE SEEN   Squamous Epithelial / LPF 0-5 0 - 5   Mucus PRESENT   Rapid urine drug screen (hospital performed)     Status: None   Collection Time: 01/14/20 11:06 PM  Result Value Ref Range   Opiates NONE DETECTED NONE DETECTED   Cocaine NONE DETECTED NONE DETECTED   Benzodiazepines NONE DETECTED NONE DETECTED   Amphetamines NONE DETECTED NONE DETECTED   Tetrahydrocannabinol NONE DETECTED NONE DETECTED   Barbiturates NONE DETECTED NONE DETECTED  CBC     Status: Abnormal   Collection Time: 01/14/20 11:38 PM  Result Value Ref Range   WBC 5.9 4.0 - 10.5 K/uL   RBC 3.89 3.87 - 5.11 MIL/uL   Hemoglobin 11.8 (L) 12.0 - 15.0 g/dL   HCT 36.4 36 - 46 %   MCV 93.6 80.0 - 100.0 fL   MCH 30.3  26.0 - 34.0 pg  MCHC 32.4 30.0 - 36.0 g/dL   RDW 12.0 11.5 - 15.5 %   Platelets 390 150 - 400 K/uL   nRBC 0.0 0.0 - 0.2 %  Comprehensive metabolic panel     Status: Abnormal   Collection Time: 01/14/20 11:38 PM  Result Value Ref Range   Sodium 134 (L) 135 - 145 mmol/L   Potassium 3.6 3.5 - 5.1 mmol/L   Chloride 101 98 - 111 mmol/L   CO2 24 22 - 32 mmol/L   Glucose, Bld 86 70 - 99 mg/dL   BUN 5 (L) 6 - 20 mg/dL   Creatinine, Ser 0.98 0.44 - 1.00 mg/dL   Calcium 9.4 8.9 - 10.3 mg/dL   Total Protein 7.1 6.5 - 8.1 g/dL   Albumin 3.7 3.5 - 5.0 g/dL   AST 15 15 - 41 U/L   ALT 12 0 - 44 U/L   Alkaline Phosphatase 39 38 - 126 U/L   Total Bilirubin 0.8 0.3 - 1.2 mg/dL   GFR calc non Af Amer >60 >60 mL/min   GFR calc Af Amer >60 >60 mL/min   Anion gap 9 5 - 15   Meds ordered this encounter  Medications  . lactated ringers bolus 1,000 mL  . metoCLOPramide (REGLAN) injection 10 mg  . diphenhydrAMINE (BENADRYL) injection 25 mg  . metoCLOPramide (REGLAN) 10 MG tablet    Sig: Take 1 tablet (10 mg total) by mouth 4 (four) times daily as needed for nausea or vomiting.    Dispense:  30 tablet    Refill:  2    Order Specific Question:   Supervising Provider    Answer:   Verita Schneiders A [[id number]]  . diphenhydrAMINE (BENADRYL) 25 MG tablet    Sig: Take 1 tablet (25 mg total) by mouth every 6 (six) hours as needed.    Dispense:  30 tablet    Refill:  0    Order Specific Question:   Supervising Provider    Answer:   Verita Schneiders A [[id number]]    Assessment and Plan  --34 y.o. [id number] at [redacted]w[redacted]d  --Nausea and vomiting in first trimester --2.4kg weight loss since 01/05/2020 --No episodes of vomiting during evaluation in MAU --Patient tolerating PO prior to discharge --Discharge home in stable condition  F/U: --PNC with [place], CNM 01/15/2020, 4:11 AM

## 2020-01-23 ENCOUNTER — Other Ambulatory Visit: Payer: Self-pay

## 2020-01-23 ENCOUNTER — Encounter (HOSPITAL_COMMUNITY): Payer: Self-pay | Admitting: Obstetrics

## 2020-01-23 ENCOUNTER — Inpatient Hospital Stay (HOSPITAL_COMMUNITY)
Admission: AD | Admit: 2020-01-23 | Discharge: 2020-01-23 | Disposition: A | Discharge status: Home / Self Care | Payer: Medicare HMO | Attending: Obstetrics | Admitting: Obstetrics

## 2020-01-23 ENCOUNTER — Inpatient Hospital Stay (HOSPITAL_COMMUNITY): Payer: Medicare HMO

## 2020-01-23 DIAGNOSIS — O26851 Spotting complicating pregnancy, first trimester: Secondary | ICD-10-CM | POA: Insufficient documentation

## 2020-01-23 DIAGNOSIS — Z79899 Other long term (current) drug therapy: Secondary | ICD-10-CM | POA: Diagnosis not present

## 2020-01-23 DIAGNOSIS — Z3A09 9 weeks gestation of pregnancy: Secondary | ICD-10-CM | POA: Insufficient documentation

## 2020-01-23 DIAGNOSIS — G35 Multiple sclerosis: Secondary | ICD-10-CM | POA: Diagnosis not present

## 2020-01-23 DIAGNOSIS — O99212 Obesity complicating pregnancy, second trimester: Secondary | ICD-10-CM | POA: Diagnosis not present

## 2020-01-23 DIAGNOSIS — O99351 Diseases of the nervous system complicating pregnancy, first trimester: Secondary | ICD-10-CM | POA: Insufficient documentation

## 2020-01-23 DIAGNOSIS — O09521 Supervision of elderly multigravida, first trimester: Secondary | ICD-10-CM | POA: Insufficient documentation

## 2020-01-23 DIAGNOSIS — O219 Vomiting of pregnancy, unspecified: Secondary | ICD-10-CM | POA: Diagnosis not present

## 2020-01-23 DIAGNOSIS — R109 Unspecified abdominal pain: Secondary | ICD-10-CM | POA: Insufficient documentation

## 2020-01-23 DIAGNOSIS — R300 Dysuria: Secondary | ICD-10-CM | POA: Diagnosis not present

## 2020-01-23 LAB — URINALYSIS, ROUTINE W REFLEX MICROSCOPIC
Bilirubin Urine: NEGATIVE
Glucose, UA: NEGATIVE mg/dL
Hgb urine dipstick: NEGATIVE
Ketones, ur: 5 mg/dL — AB
Leukocytes,Ua: NEGATIVE
Nitrite: NEGATIVE
Protein, ur: NEGATIVE mg/dL
Specific Gravity, Urine: 1.006 (ref 1.005–1.030)
pH: 6 (ref 5.0–8.0)

## 2020-01-23 LAB — WET PREP, GENITAL
Clue Cells Wet Prep HPF POC: NONE SEEN
Sperm: NONE SEEN
Trich, Wet Prep: NONE SEEN
Yeast Wet Prep HPF POC: NONE SEEN

## 2020-01-23 MED ORDER — PROMETHAZINE HCL 25 MG/ML IJ SOLN
25.0000 mg | Freq: Once | INTRAMUSCULAR | Status: AC
  Administered 2020-01-23: 25 mg via INTRAVENOUS
  Filled 2020-01-23: qty 1

## 2020-01-23 MED ORDER — LACTATED RINGERS IV BOLUS
1000.0000 mL | Freq: Once | INTRAVENOUS | Status: AC
  Administered 2020-01-23: 1000 mL via INTRAVENOUS

## 2020-01-23 MED ORDER — FAMOTIDINE IN NACL 20-0.9 MG/50ML-% IV SOLN
20.0000 mg | Freq: Once | INTRAVENOUS | Status: AC
  Administered 2020-01-23: 20 mg via INTRAVENOUS
  Filled 2020-01-23: qty 50

## 2020-01-23 MED ORDER — PROMETHAZINE HCL 12.5 MG PO TABS: 12.5000 mg | ORAL_TABLET | Freq: Four times a day (QID) | ORAL | 2 refills | Status: DC | PRN

## 2020-01-23 MED ORDER — DOXYLAMINE-PYRIDOXINE 10-10 MG PO TBEC: 2.0000 | DELAYED_RELEASE_TABLET | Freq: Every day | ORAL | 4 refills | Status: DC

## 2020-01-23 NOTE — Discharge Instructions (Signed)
Hyperemesis Gravidarum Hyperemesis gravidarum is a severe form of nausea and vomiting that happens during pregnancy. Hyperemesis is worse than morning sickness. It may cause you to have nausea or vomiting all day for many days. It may keep you from eating and drinking enough food and liquids, which can lead to dehydration, malnutrition, and weight loss. Hyperemesis usually occurs during the first half (the first 20 weeks) of pregnancy. It often goes away once a woman is in her second half of pregnancy. However, sometimes hyperemesis continues through an entire pregnancy. What are the causes? The cause of this condition is not known. It may be related to changes in chemicals (hormones) in the body during pregnancy, such as the high level of pregnancy hormone (human chorionic gonadotropin) or the increase in the female sex hormone (estrogen). What are the signs or symptoms? Symptoms of this condition include:  Nausea that does not go away.  Vomiting that does not allow you to keep any food down.  Weight loss.  Body fluid loss (dehydration).  Having no desire to eat, or not liking food that you have previously enjoyed. How is this diagnosed? This condition may be diagnosed based on:  A physical exam.  Your medical history.  Your symptoms.  Blood tests.  Urine tests. How is this treated? This condition is managed by controlling symptoms. This may include:  Following an eating plan. This can help lessen nausea and vomiting.  Taking prescription medicines. An eating plan and medicines are often used together to help control symptoms. If medicines do not help relieve nausea and vomiting, you may need to receive fluids through an IV at the hospital. Follow these instructions at home: Eating and drinking   Avoid the following: ? Drinking fluids with meals. Try not to drink anything during the 30 minutes before and after your meals. ? Drinking more than 1 cup of fluid at a  time. ? Eating foods that trigger your symptoms. These may include spicy foods, coffee, high-fat foods, very sweet foods, and acidic foods. ? Skipping meals. Nausea can be more intense on an empty stomach. If you cannot tolerate food, do not force it. Try sucking on ice chips or other frozen items and make up for missed calories later. ? Lying down within 2 hours after eating. ? Being exposed to environmental triggers. These may include food smells, smoky rooms, closed spaces, rooms with strong smells, warm or humid places, overly loud and noisy rooms, and rooms with motion or flickering lights. Try eating meals in a well-ventilated area that is free of strong smells. ? Quick and sudden changes in your movement. ? Taking iron pills and multivitamins that contain iron. If you take prescription iron pills, do not stop taking them unless your health care provider approves. ? Preparing food. The smell of food can spoil your appetite or trigger nausea.  To help relieve your symptoms: ? Listen to your body. Everyone is different and has different preferences. Find what works best for you. ? Eat and drink slowly. ? Eat 5-6 small meals daily instead of 3 large meals. Eating small meals and snacks can help you avoid an empty stomach. ? In the morning, before getting out of bed, eat a couple of crackers to avoid moving around on an empty stomach. ? Try eating starchy foods as these are usually tolerated well. Examples include cereal, toast, bread, potatoes, pasta, rice, and pretzels. ? Include at least 1 serving of protein with your meals and snacks. Protein options include   lean meats, poultry, seafood, beans, nuts, nut butters, eggs, cheese, and yogurt. ? Try eating a protein-rich snack before bed. Examples of a protein-rick snack include cheese and crackers or a peanut butter sandwich made with 1 slice of whole-wheat bread and 1 tsp (5 g) of peanut butter. ? Eat or suck on things that have ginger in them.  It may help relieve nausea. Add  tsp ground ginger to hot tea or choose ginger tea. ? Try drinking 100% fruit juice or an electrolyte drink. An electrolyte drink contains sodium, potassium, and chloride. ? Drink fluids that are cold, clear, and carbonated or sour. Examples include lemonade, ginger ale, lemon-lime soda, ice water, and sparkling water. ? Brush your teeth or use a mouth rinse after meals. ? Talk with your health care provider about starting a supplement of vitamin B6. General instructions  Take over-the-counter and prescription medicines only as told by your health care provider.  Follow instructions from your health care provider about eating or drinking restrictions.  Continue to take your prenatal vitamins as told by your health care provider. If you are having trouble taking your prenatal vitamins, talk with your health care provider about different options.  Keep all follow-up and pre-birth (prenatal) visits as told by your health care provider. This is important. Contact a health care provider if:  You have pain in your abdomen.  You have a severe headache.  You have vision problems.  You are losing weight.  You feel weak or dizzy. Get help right away if:  You cannot drink fluids without vomiting.  You vomit blood.  You have constant nausea and vomiting.  You are very weak.  You faint.  You have a fever and your symptoms suddenly get worse. Summary  Hyperemesis gravidarum is a severe form of nausea and vomiting that happens during pregnancy.  Making some changes to your eating habits may help relieve nausea and vomiting.  This condition may be managed with medicine.  If medicines do not help relieve nausea and vomiting, you may need to receive fluids through an IV at the hospital. This information is not intended to replace advice given to you by your health care provider. Make sure you discuss any questions you have with your health care  provider. Document Revised: 06/21/2017 Document Reviewed: 01/29/2016 Elsevier Patient Education  2020 [employer]  

## 2020-01-23 NOTE — MAU Provider Note (Signed)
History     CSN: [id number]  Arrival date and time: 01/23/20 0174   First Provider Initiated Contact with Patient 01/23/20 1918      Chief Complaint  Patient presents with  . Emesis   [name] is a 34 y.o. [id number] at [redacted]w[redacted]d by LMP of 11/17/2019 and confirmed by 7wk [place].  Patient receives care at [facility].  Patient states this pregnancy occurred after a tubal reversal and she is considering returning to [facility] for care. She presents today for Emesis.  She states she has been vomiting "forever it seems."  She states today she has thrown up about 8x.  She reports taking zofran, reglan, and scopolamine patch today without resolution of symptoms.  Patient goes on to state that "everything that goes in my mouth comes back up."  Patient reports she has had hyperemesis in previous pregnancies and received a Zofran pump and NG tube.  She reports she is now 233lbs and was 250lbs at [facility] at [facility].  Patient states she started having dysuria and spotting yesterday.  She also reports some cramping that started yesterday as well.  Patient states all these sypmptoms have continued today and the spotting is light pink and only present when wiping.    OB History    Gravida  7   Para  3   Term  1   Preterm  2   AB  3   Living  4     SAB      TAB  3   Ectopic      Multiple  1   Live Births  2           Past Medical History:  Diagnosis Date  . Abnormal Pap smear    Colpo>normal  . Gonorrhea   . Headache    last migraine 01/2017  . History of IBS    watch diet, no meds  . Multiple sclerosis ([place])   . Neuromuscular disorder [facility])    diagnosed with MS this visit  . Obesity   . Ovarian cyst   . Pernicious anemia 05/02/2013  . Pregnancy induced hypertension   . Preterm labor    all 3 deliveries  . SVD (spontaneous vaginal delivery)    x 3  . Urinary tract infection   . Vaginal Pap smear, abnormal     Past Surgical History:  Procedure Laterality Date  .  CHOLECYSTECTOMY N/A 11/25/2017   Procedure: LAPAROSCOPIC CHOLECYSTECTOMY WITH INTRAOPERATIVE CHOLANGIOGRAM;  Surgeon: Armandina Gemma, MD;  Location: [facility];  Service: General;  Laterality: N/A;  . COLPOSCOPY    . MYOMECTOMY N/A 04/01/2017   Procedure: Vaginal Myomectomy;  Surgeon: Osborne Oman, MD;  Location: [facility];  Service: Gynecology;  Laterality: N/A;  . TUBAL LIGATION     2012  . WISDOM TOOTH EXTRACTION      Family History  Problem Relation Age of Onset  . Diabetes Mother   . Hypertension Mother   . Diabetes Father   . Hypertension Father   . Anesthesia problems Neg Hx   . Other Neg Hx     Social History   Tobacco Use  . Smoking status: Never Smoker  . Smokeless tobacco: Never Used  Vaping Use  . Vaping Use: Never used  Substance Use Topics  . Alcohol use: No    Alcohol/week: 0.0 standard drinks    Comment:    . Drug use: No    Allergies: No Known Allergies  Medications Prior to  Admission  Medication Sig Dispense Refill Last Dose  . metoCLOPramide (REGLAN) 10 MG tablet Take 1 tablet (10 mg total) by mouth 4 (four) times daily as needed for nausea or vomiting. 30 tablet 2 01/22/2020 at Unknown time  . ondansetron (ZOFRAN-ODT) 4 MG disintegrating tablet Take 4 mg by mouth every 8 (eight) hours as needed for nausea or vomiting.   01/23/2020 at Unknown time  . scopolamine (TRANSDERM-SCOP) 1 MG/3DAYS Place 1 patch (1.5 mg total) onto the skin every 3 (three) days. 10 patch 0 Past Week at Unknown time  . diphenhydrAMINE (BENADRYL) 25 MG tablet Take 1 tablet (25 mg total) by mouth every 6 (six) hours as needed. 30 tablet 0   . sertraline (ZOLOFT) 25 MG tablet TAKE 1 TABLET BY MOUTH EVERY DAY 90 tablet 1     Review of Systems  Constitutional: Negative for chills and fever.  Gastrointestinal: Positive for abdominal pain (Cramping), nausea and vomiting. Negative for constipation and diarrhea.  Genitourinary: Positive for dysuria, vaginal bleeding and vaginal discharge.  Negative for difficulty urinating.  Neurological: Negative for dizziness, light-headedness and headaches.   Physical Exam   Blood pressure (!) 118/59, pulse 67, temperature 98.7 F (37.1 C), temperature source Oral, resp. rate 17, weight 105.7 kg, last menstrual period 11/17/2019, SpO2 100 %, unknown if currently breastfeeding.  Physical Exam Vitals reviewed. Exam conducted with a chaperone present.  Constitutional:      General: She is not in acute distress.    Appearance: Normal appearance. She is not ill-appearing.  HENT:     Head: Normocephalic and atraumatic.  Eyes:     Conjunctiva/sclera: Conjunctivae normal.  Cardiovascular:     Rate and Rhythm: Normal rate.  Pulmonary:     Effort: Pulmonary effort is normal. No respiratory distress.  Abdominal:     Palpations: Abdomen is soft.     Tenderness: There is abdominal tenderness in the right lower quadrant, suprapubic area and left lower quadrant.  Genitourinary:    Vagina: No vaginal discharge or bleeding.     Cervix: No cervical motion tenderness, discharge, friability, lesion or cervical bleeding.     Uterus: Enlarged. Not fixed and not tender.      Comments: Speculum Exam: -Normal External Genitalia: Non tender, no apparent discharge at introitus.  -Vaginal Vault: Pink mucosa with good rugae. No apparent discharge -wet prep collected -Cervix:Pink, no lesions, cysts, or polyps.  Appears closed. No active bleeding from os- -Bimanual Exam:  Uterus firm, but nontender.  Skin:    General: Skin is warm and dry.  Neurological:     Mental Status: She is alert and oriented to person, place, and time.  Psychiatric:        Mood and Affect: Mood normal.        Behavior: Behavior normal.        Thought Content: Thought content normal.     MAU Course  Procedures Results for orders placed or performed during the hospital encounter of 01/23/20 (from the past 24 hour(s))  Wet prep, genital     Status: Abnormal   Collection Time:  01/23/20  7:47 PM   Specimen: Cervix  Result Value Ref Range   Yeast Wet Prep HPF POC NONE SEEN NONE SEEN   Trich, Wet Prep NONE SEEN NONE SEEN   Clue Cells Wet Prep HPF POC NONE SEEN NONE SEEN   WBC, Wet Prep HPF POC MANY (A) NONE SEEN   Sperm NONE SEEN    US OB Comp Less 14  Wks  Result Date: 01/23/2020 CLINICAL DATA:  Vaginal bleeding. Patient is 9 weeks and 4 days pregnant based on her last menstrual period. EXAM: OBSTETRIC <14 WK ULTRASOUND TECHNIQUE: Transabdominal ultrasound was performed for evaluation of the gestation as well as the maternal uterus and adnexal regions. COMPARISON:  None. FINDINGS: Intrauterine gestational sac: Single Yolk sac:  Visualized. Embryo:  Visualized. Cardiac Activity: Visualized. Heart Rate: 176 bpm CRL:   2.53 cm   9 w 1 d                  [place] EDC: 08/26/2020 Subchorionic hemorrhage:  None visualized. Maternal uterus/adnexae: No uterine masses. No adnexal masses. Right ovary not visualized. Normal left ovary. No pelvic free fluid. IMPRESSION: 1. Single live intrauterine pregnancy with a measured gestational age of [redacted] weeks and day, consistent with the expected gestational age based on the last menstrual period. 2. No pregnancy complication. Electronically Signed   By: Lajean Manes M.D.   On: 01/23/2020 20:53     MDM Start IV LR Bolus Antiemetic PPI Assessment and Plan  34 year old [id number] SIUP at 9.4 weeks Nausea/Vomiting Vaginal Spotting  -POC reviewed. -Exam performed and findings discussed. -Cultures collected and pending.  -Discussed initiation of IV with LR bolus. -Will give phenergan and pepcid IV. -Will send for [place] for fetal hr and vaginal spotting. -Will await results.  [name] 01/23/2020, 7:19 PM   Reassessment (9:21 PM)  -Patient reports improvement in nausea, but some fatigue. -Informed that this is a symptom of phenergan. -Patient instructed to take phenergan at home prn and will send script for diclegis to pharmacy on  file. -Reviewed medication usage.  Informed that phenergan can be placed in vagina if necessary. -Patient requests information of [facility].  -Encouraged to call or return to MAU if symptoms worsen or with the onset of new symptoms. -Discharged to home in stable condition.  Meghan Conners MSN, CNM Advanced Practice Provider, [facility]

## 2020-01-23 NOTE — MAU Note (Signed)
Pt reports vomiting that started 2 weeks ago.   Pt reports burning and cramping with urination.   Pt reports seeing some blood when wiping.

## 2020-01-30 ENCOUNTER — Encounter (HOSPITAL_COMMUNITY): Payer: Self-pay | Admitting: Obstetrics and Gynecology

## 2020-01-30 ENCOUNTER — Other Ambulatory Visit: Payer: Self-pay

## 2020-01-30 ENCOUNTER — Inpatient Hospital Stay (HOSPITAL_COMMUNITY)
Admission: AD | Admit: 2020-01-30 | Discharge: 2020-01-30 | Disposition: A | Discharge status: Home / Self Care | Payer: Medicare HMO | Attending: Obstetrics and Gynecology | Admitting: Obstetrics and Gynecology

## 2020-01-30 DIAGNOSIS — G35 Multiple sclerosis: Secondary | ICD-10-CM | POA: Insufficient documentation

## 2020-01-30 DIAGNOSIS — O99211 Obesity complicating pregnancy, first trimester: Secondary | ICD-10-CM | POA: Insufficient documentation

## 2020-01-30 DIAGNOSIS — O99611 Diseases of the digestive system complicating pregnancy, first trimester: Secondary | ICD-10-CM | POA: Insufficient documentation

## 2020-01-30 DIAGNOSIS — Z3A10 10 weeks gestation of pregnancy: Secondary | ICD-10-CM | POA: Diagnosis not present

## 2020-01-30 DIAGNOSIS — O99351 Diseases of the nervous system complicating pregnancy, first trimester: Secondary | ICD-10-CM | POA: Diagnosis not present

## 2020-01-30 DIAGNOSIS — O219 Vomiting of pregnancy, unspecified: Secondary | ICD-10-CM

## 2020-01-30 DIAGNOSIS — F1290 Cannabis use, unspecified, uncomplicated: Secondary | ICD-10-CM | POA: Diagnosis not present

## 2020-01-30 DIAGNOSIS — Z79899 Other long term (current) drug therapy: Secondary | ICD-10-CM | POA: Insufficient documentation

## 2020-01-30 DIAGNOSIS — K589 Irritable bowel syndrome without diarrhea: Secondary | ICD-10-CM | POA: Insufficient documentation

## 2020-01-30 DIAGNOSIS — O99321 Drug use complicating pregnancy, first trimester: Secondary | ICD-10-CM

## 2020-01-30 LAB — CBC
HCT: 34.1 % — ABNORMAL LOW (ref 36.0–46.0)
Hemoglobin: 10.8 g/dL — ABNORMAL LOW (ref 12.0–15.0)
MCH: 30.2 pg (ref 26.0–34.0)
MCHC: 31.7 g/dL (ref 30.0–36.0)
MCV: 95.3 fL (ref 80.0–100.0)
Platelets: 414 10*3/uL — ABNORMAL HIGH (ref 150–400)
RBC: 3.58 MIL/uL — ABNORMAL LOW (ref 3.87–5.11)
RDW: 12.1 % (ref 11.5–15.5)
WBC: 6.6 10*3/uL (ref 4.0–10.5)
nRBC: 0 % (ref 0.0–0.2)

## 2020-01-30 LAB — COMPREHENSIVE METABOLIC PANEL
ALT: 17 U/L (ref 0–44)
AST: 18 U/L (ref 15–41)
Albumin: 3.4 g/dL — ABNORMAL LOW (ref 3.5–5.0)
Alkaline Phosphatase: 42 U/L (ref 38–126)
Anion gap: 11 (ref 5–15)
BUN: 5 mg/dL — ABNORMAL LOW (ref 6–20)
CO2: 23 mmol/L (ref 22–32)
Calcium: 9.4 mg/dL (ref 8.9–10.3)
Chloride: 100 mmol/L (ref 98–111)
Creatinine, Ser: 0.72 mg/dL (ref 0.44–1.00)
GFR calc Af Amer: >60 mL/min (ref >60)
GFR calc non Af Amer: >60 mL/min (ref >60)
Glucose, Bld: 85 mg/dL (ref 70–99)
Potassium: 3.7 mmol/L (ref 3.5–5.1)
Sodium: 134 mmol/L — ABNORMAL LOW (ref 135–145)
Total Bilirubin: 0.8 mg/dL (ref 0.3–1.2)
Total Protein: 6.9 g/dL (ref 6.5–8.1)

## 2020-01-30 LAB — RAPID URINE DRUG SCREEN, HOSP PERFORMED
Amphetamines: NOT DETECTED
Barbiturates: NOT DETECTED
Benzodiazepines: NOT DETECTED
Cocaine: NOT DETECTED
Opiates: NOT DETECTED
Tetrahydrocannabinol: POSITIVE — AB

## 2020-01-30 LAB — URINALYSIS, ROUTINE W REFLEX MICROSCOPIC
Bacteria, UA: NONE SEEN
Bilirubin Urine: NEGATIVE
Glucose, UA: NEGATIVE mg/dL
Ketones, ur: 80 mg/dL — AB
Leukocytes,Ua: NEGATIVE
Nitrite: NEGATIVE
Protein, ur: 30 mg/dL — AB
Specific Gravity, Urine: 1.025 (ref 1.005–1.030)
pH: 5 (ref 5.0–8.0)

## 2020-01-30 MED ORDER — SCOPOLAMINE 1 MG/3DAYS TD PT72
1.0000 | MEDICATED_PATCH | TRANSDERMAL | Status: DC
  Administered 2020-01-30: 1.5 mg via TRANSDERMAL
  Filled 2020-01-30: qty 1

## 2020-01-30 MED ORDER — LACTATED RINGERS IV BOLUS
1000.0000 mL | Freq: Once | INTRAVENOUS | Status: AC
  Administered 2020-01-30: 1000 mL via INTRAVENOUS

## 2020-01-30 MED ORDER — ONDANSETRON 4 MG PO TBDP: 4.0000 mg | ORAL_TABLET | Freq: Three times a day (TID) | ORAL | 1 refills | Status: DC | PRN

## 2020-01-30 MED ORDER — PANTOPRAZOLE SODIUM 20 MG PO TBEC
20.0000 mg | DELAYED_RELEASE_TABLET | Freq: Every day | ORAL | 1 refills | Status: DC
Start: 2020-01-30 — End: 2020-02-24

## 2020-01-30 MED ORDER — SCOPOLAMINE 1 MG/3DAYS TD PT72: 1.0000 | MEDICATED_PATCH | TRANSDERMAL | 12 refills | Status: DC

## 2020-01-30 MED ORDER — SODIUM CHLORIDE 0.9 % IV SOLN
8.0000 mg | Freq: Once | INTRAVENOUS | Status: AC
  Administered 2020-01-30: 8 mg via INTRAVENOUS
  Filled 2020-01-30: qty 4

## 2020-01-30 MED ORDER — PANTOPRAZOLE SODIUM 40 MG IV SOLR
40.0000 mg | Freq: Once | INTRAVENOUS | Status: AC
  Administered 2020-01-30: 40 mg via INTRAVENOUS
  Filled 2020-01-30: qty 40

## 2020-01-30 NOTE — Discharge Instructions (Signed)
Morning Sickness  Morning sickness is when a woman feels nauseous during pregnancy. This nauseous feeling may or may not come with vomiting. It often occurs in the morning, but it can be a problem at any time of day. Morning sickness is most common during the first trimester. In some cases, it may continue throughout pregnancy. Although morning sickness is unpleasant, it is usually harmless unless the woman develops severe and continual vomiting (hyperemesis gravidarum), a condition that requires more intense treatment. What are the causes? The exact cause of this condition is not known, but it seems to be related to normal hormonal changes that occur in pregnancy. What increases the risk? You are more likely to develop this condition if:  You experienced nausea or vomiting before your pregnancy.  You had morning sickness during a previous pregnancy.  You are pregnant with more than one baby, such as twins. What are the signs or symptoms? Symptoms of this condition include:  Nausea.  Vomiting. How is this diagnosed? This condition is usually diagnosed based on your signs and symptoms. How is this treated? In many cases, treatment is not needed for this condition. Making some changes to what you eat may help to control symptoms. Your health care provider may also prescribe or recommend:  Vitamin B6 supplements.  Anti-nausea medicines.  Ginger. Follow these instructions at home: Medicines  Take over-the-counter and prescription medicines only as told by your health care provider. Do not use any prescription, over-the-counter, or herbal medicines for morning sickness without first talking with your health care provider.  Taking multivitamins before getting pregnant can prevent or decrease the severity of morning sickness in most women. Eating and drinking  Eat a piece of dry toast or crackers before getting out of bed in the morning.  Eat 5 or 6 small meals a day.  Eat dry and  bland foods, such as rice or a baked potato. Foods that are high in carbohydrates are often helpful.  Avoid greasy, fatty, and spicy foods.  Have someone cook for you if the smell of any food causes nausea and vomiting.  If you feel nauseous after taking prenatal vitamins, take the vitamins at night or with a snack.  Snack on protein foods between meals if you are hungry. Nuts, yogurt, and cheese are good options.  Drink fluids throughout the day.  Try ginger ale made with real ginger, ginger tea made from fresh grated ginger, or ginger candies. General instructions  Do not use any products that contain nicotine or tobacco, such as cigarettes and e-cigarettes. If you need help quitting, ask your health care provider.  Get an air purifier to keep the air in your house free of odors.  Get plenty of fresh air.  Try to avoid odors that trigger your nausea.  Consider trying these methods to help relieve symptoms: ? Wearing an acupressure wristband. These wristbands are often worn for seasickness. ? Acupuncture. Contact a health care provider if:  Your home remedies are not working and you need medicine.  You feel dizzy or light-headed.  You are losing weight. Get help right away if:  You have persistent and uncontrolled nausea and vomiting.  You faint.  You have severe pain in your abdomen. Summary  Morning sickness is when a woman feels nauseous during pregnancy. This nauseous feeling may or may not come with vomiting.  Morning sickness is most common during the first trimester.  It often occurs in the morning, but it can be a problem at  any time of day.  In many cases, treatment is not needed for this condition. Making some changes to what you eat may help to control symptoms. This information is not intended to replace advice given to you by your health care provider. Make sure you discuss any questions you have with your health care provider. Document Revised:  05/14/2017 Document Reviewed: 07/04/2016 Elsevier Patient Education  2020 [place].         Hyperemesis Gravidarum Hyperemesis gravidarum is a severe form of nausea and vomiting that happens during pregnancy. Hyperemesis is worse than morning sickness. It may cause you to have nausea or vomiting all day for many days. It may keep you from eating and drinking enough food and liquids, which can lead to dehydration, malnutrition, and weight loss. Hyperemesis usually occurs during the first half (the first 20 weeks) of pregnancy. It often goes away once a woman is in her second half of pregnancy. However, sometimes hyperemesis continues through an entire pregnancy. What are the causes? The cause of this condition is not known. It may be related to changes in chemicals (hormones) in the body during pregnancy, such as the high level of pregnancy hormone (human chorionic gonadotropin) or the increase in the female sex hormone (estrogen). What are the signs or symptoms? Symptoms of this condition include:  Nausea that does not go away.  Vomiting that does not allow you to keep any food down.  Weight loss.  Body fluid loss (dehydration).  Having no desire to eat, or not liking food that you have previously enjoyed. How is this diagnosed? This condition may be diagnosed based on:  A physical exam.  Your medical history.  Your symptoms.  Blood tests.  Urine tests. How is this treated? This condition is managed by controlling symptoms. This may include:  Following an eating plan. This can help lessen nausea and vomiting.  Taking prescription medicines. An eating plan and medicines are often used together to help control symptoms. If medicines do not help relieve nausea and vomiting, you may need to receive fluids through an IV at the hospital. Follow these instructions at home: Eating and drinking   Avoid the following: ? Drinking fluids with meals. Try not to drink  anything during the 30 minutes before and after your meals. ? Drinking more than 1 cup of fluid at a time. ? Eating foods that trigger your symptoms. These may include spicy foods, coffee, high-fat foods, very sweet foods, and acidic foods. ? Skipping meals. Nausea can be more intense on an empty stomach. If you cannot tolerate food, do not force it. Try sucking on ice chips or other frozen items and make up for missed calories later. ? Lying down within 2 hours after eating. ? Being exposed to environmental triggers. These may include food smells, smoky rooms, closed spaces, rooms with strong smells, warm or humid places, overly loud and noisy rooms, and rooms with motion or flickering lights. Try eating meals in a well-ventilated area that is free of strong smells. ? Quick and sudden changes in your movement. ? Taking iron pills and multivitamins that contain iron. If you take prescription iron pills, do not stop taking them unless your health care provider approves. ? Preparing food. The smell of food can spoil your appetite or trigger nausea.  To help relieve your symptoms: ? Listen to your body. Everyone is different and has different preferences. Find what works best for you. ? Eat and drink slowly. ? Eat 5-6 small  meals daily instead of 3 large meals. Eating small meals and snacks can help you avoid an empty stomach. ? In the morning, before getting out of bed, eat a couple of crackers to avoid moving around on an empty stomach. ? Try eating starchy foods as these are usually tolerated well. Examples include cereal, toast, bread, potatoes, pasta, rice, and pretzels. ? Include at least 1 serving of protein with your meals and snacks. Protein options include lean meats, poultry, seafood, beans, nuts, nut butters, eggs, cheese, and yogurt. ? Try eating a protein-rich snack before bed. Examples of a protein-rick snack include cheese and crackers or a peanut butter sandwich made with 1 slice of  whole-wheat bread and 1 tsp (5 g) of peanut butter. ? Eat or suck on things that have ginger in them. It may help relieve nausea. Add  tsp ground ginger to hot tea or choose ginger tea. ? Try drinking 100% fruit juice or an electrolyte drink. An electrolyte drink contains sodium, potassium, and chloride. ? Drink fluids that are cold, clear, and carbonated or sour. Examples include lemonade, ginger ale, lemon-lime soda, ice water, and sparkling water. ? Brush your teeth or use a mouth rinse after meals. ? Talk with your health care provider about starting a supplement of vitamin B6. General instructions  Take over-the-counter and prescription medicines only as told by your health care provider.  Follow instructions from your health care provider about eating or drinking restrictions.  Continue to take your prenatal vitamins as told by your health care provider. If you are having trouble taking your prenatal vitamins, talk with your health care provider about different options.  Keep all follow-up and pre-birth (prenatal) visits as told by your health care provider. This is important. Contact a health care provider if:  You have pain in your abdomen.  You have a severe headache.  You have vision problems.  You are losing weight.  You feel weak or dizzy. Get help right away if:  You cannot drink fluids without vomiting.  You vomit blood.  You have constant nausea and vomiting.  You are very weak.  You faint.  You have a fever and your symptoms suddenly get worse. Summary  Hyperemesis gravidarum is a severe form of nausea and vomiting that happens during pregnancy.  Making some changes to your eating habits may help relieve nausea and vomiting.  This condition may be managed with medicine.  If medicines do not help relieve nausea and vomiting, you may need to receive fluids through an IV at the hospital. This information is not intended to replace advice given to you by  your health care provider. Make sure you discuss any questions you have with your health care provider. Document Revised: 06/21/2017 Document Reviewed: 01/29/2016 Elsevier Patient Education  [place].         Marijuana Use During Pregnancy and Breastfeeding  Marijuana is the dried leaves, flowers, and stems of the Cannabis sativa or Cannabis indica plant. The plant's active ingredients (cannabinoids), including a chemical called THC, change the chemistry of the brain. Marijuana smoke also has many of the same chemicals as cigarette smoke that cause breathing problems. Marijuana gets into your blood through your lungs when you smoke it and through your digestive system when you swallow it. Using marijuana in any form may be harmful for you and your baby when you are trying to become pregnant and during pregnancy. This includes marijuana that is prescribed to you by a health care  provider (medical marijuana). Once marijuana is in your blood, it can travel through your placenta to your baby. It may also pass through breast milk. How does this affect me? Marijuana affects you both mentally and physically. Using marijuana can make you feel high and relaxed. It can also have negative effects, especially at high doses or with long-term use. These include:  Rapid heartbeat and stress on your heart.  Lung irritation and breathing problems.  Difficulty thinking and making decisions.  Seeing or believing things that are not true (hallucinations and paranoia).  Mood swings, depression, or anxiety.  Decreased ability to learn and remember.  Difficulty getting pregnant. Marijuana can also affect your pregnancy. Not all the effects are known. However, if you use marijuana during pregnancy, you may:  Be less likely to get regular prenatal care and do the things that you need to do to have a healthy pregnancy.  Be more likely to use other drugs that can harm your pregnancy, like drinking  alcohol and smoking cigarettes.  Be at higher risk of having your baby die after 28 weeks of pregnancy (stillbirth).  Be at higher risk of giving birth before 37 weeks of pregnancy (premature birth). How does this affect my baby? If you use marijuana during pregnancy, this may affect your baby's development, birth, and life after birth. Your baby may:  Be born prematurely, which can cause physical and mental problems.  Be born with a low birth weight, which can lead to physical and mental problems.  Have problems with brain development.  Have difficulty growing.  Have attention and behavior problems later in life.  Do poorly at school and have learning problems later in life.  Have problems with vision and coordination.  Be at higher risk for using marijuana by age 51. More research is needed to find out exactly how marijuana affects a baby during breastfeeding. Some studies suggest that the chemicals in marijuana can be passed to a baby through breast milk. To limit possible risks, you should not use marijuana during breastfeeding. Follow these instructions at home:  Let your health care provider know if you use marijuana before trying to get pregnant, during pregnancy, or during breastfeeding.  Do not use marijuana in any form when you are trying to get pregnant, when you are pregnant, or when you are breastfeeding. If you are having trouble stopping marijuana use, ask your health care provider for help.  Do not smoke. If you need help quitting, ask your health care provider for help.  If you are using medical marijuana, ask your health care provider to switch you to a medicine that is safer to use during pregnancy or breastfeeding.  Keep all your prenatal visits as told by your health care provider. This is important. Where to find more information [employer] on Drug Abuse: www.drugabuse.gov March of Dimes: www.marchofdimes.org/pregnancy Contact a health care provider  if:  You use marijuana and want to get pregnant.  You use marijuana during pregnancy or breastfeeding.  You need help stopping marijuana use. Get help right away if:  Your baby is not gaining weight or growing as expected. Summary  Using marijuana in any form may be harmful for you and your baby when you are trying to become pregnant, during pregnancy, and during breastfeeding. This includes marijuana that is prescribed to you (medical marijuana).  Some studies suggest that marijuana may pass through breast milk and can affect your baby's brain development.  Talk to your health care provider if  you use marijuana in any form while trying to get pregnant, during pregnancy, or while breastfeeding.  Ask your health care provider for help if you are not able to stop using marijuana. This information is not intended to replace advice given to you by your health care provider. Make sure you discuss any questions you have with your health care provider. Document Revised: 09/23/2018 Document Reviewed: 02/17/2017 Elsevier Patient Education  2020 [place].         Cannabinoid Hyperemesis Syndrome Cannabinoid hyperemesis syndrome (CHS) is a condition that causes repeated nausea, vomiting, and abdominal pain after long-term (chronic) use of marijuana (cannabis). People with CHS typically use marijuana 3-5 times a day for many years before they have symptoms, although it is possible to develop CHS with as little as 1 use per day. Symptoms of CHS may be mild at first but can get worse and more frequent. In some cases, CHS may cause vomiting many times a day, which can lead to weight loss and dehydration. CHS may go away and come back many times (recur). People may not have symptoms or may otherwise be healthy in between [facility] attacks. What are the causes? The exact cause of this condition is not known. Long-term use of marijuana may over-stimulate certain proteins in the brain that react with  chemicals in marijuana (cannabinoid receptors). This over-stimulation may cause CHS. What are the signs or symptoms? Symptoms of this condition are often mild during the first few attacks, but they can get worse over time. Symptoms may include:  Frequent nausea, especially early in the morning.  Vomiting.  Abdominal pain. Taking several hot showers throughout the day can also be a sign of this condition. People with CHS may do this because it relieves symptoms. How is this diagnosed? This condition may be diagnosed based on:  Your symptoms and medical history, including any drug use.  A physical exam. You may have tests done to rule out other problems. These tests may include:  Blood tests.  Urine tests.  Imaging tests, such as an X-ray or CT scan. How is this treated? Treatment for this condition involves stopping marijuana use. Your health care provider may recommend:  A drug rehabilitation program, if you have trouble stopping marijuana use.  Medicines for nausea.  Hot showers to help relieve symptoms. Certain creams that contain a substance called capsaicin may improve symptoms when applied to the abdomen. Ask your health care provider before starting any medicines or other treatments. Severe nausea and vomiting may require you to stay at the hospital. You may need IV fluids to prevent or treat dehydration. You may also need certain medicines that must be given at the hospital. Follow these instructions at home: During an attack   Stay in bed and rest in a dark, quiet room.  Take anti-nausea medicine as told by your health care provider.  Try taking hot showers to relieve your symptoms. After an attack  Drink small amounts of clear fluids slowly. Gradually add more.  Once you are able to eat without vomiting, eat soft foods in small amounts every 3-4 hours. General instructions   Do not use any products that contain marijuana.If you need help quitting, ask your  health care provider for resources and treatment options.  Drink enough fluid to keep your urine pale yellow. Avoid drinking fluids that have a lot of sugar or caffeine, such as coffee and soda.  Take and apply over-the-counter and prescription medicines only as told by your health  care provider. Ask your health care provider before starting any new medicines or treatments.  Keep all follow-up visits as told by your health care provider. This is important. Contact a health care provider if:  Your symptoms get worse.  You cannot drink fluids without vomiting.  You have pain and trouble swallowing after an attack. Get help right away if:  You cannot stop vomiting.  You have blood in your vomit or your vomit looks like coffee grounds.  You have severe abdominal pain.  You have stools that are bloody or black, or stools that look like tar.  You have symptoms of dehydration, such as: ? Sunken eyes. ? Inability to make tears. ? Cracked lips. ? Dry mouth. ? Decreased urine production. ? Weakness. ? Sleepiness. ? Fainting. Summary  Cannabinoid hyperemesis syndrome (CHS) is a condition that causes repeated nausea, vomiting, and abdominal pain after long-term use of marijuana.  People with CHS typically use marijuana 3-5 times a day for many years before they have symptoms, although it is possible to develop CHS with as little as 1 use per day.  Treatment for this condition involves stopping marijuana use. Hot showers and capsaicin creams may also help relieve symptoms. Ask your health care provider before starting any medicines or other treatments.  Your health care provider may prescribe medicines to help with nausea.  Get help right away if you have signs of dehydration, such as dry mouth, decreased urine production, or weakness. This information is not intended to replace advice given to you by your health care provider. Make sure you discuss any questions you have with your  health care provider. Document Revised: 10/08/2017 Document Reviewed: 09/09/2016 Elsevier Patient Education  [place].

## 2020-01-30 NOTE — MAU Note (Signed)
Presents with c/o N&V, reports unable to keep anything down.  States has vomited approximately 10x in 24 hours.  Also reports having abdominal cramping d/t excessive vomiting.  States has taken Phenergan, Zofran, Diclegis, and Scopalomine patch, "nothing is working"

## 2020-01-30 NOTE — MAU Provider Note (Signed)
History     CSN: [id number]  Arrival date and time: 01/30/20 1212   First Provider Initiated Contact with Patient 01/30/20 1313      Chief Complaint  Patient presents with  . Emesis  . Nausea   [name] is a 34 y.o. [id number] at [redacted]w[redacted]d who presents to MAU for on-going nausea and vomiting. Patient reports it first started about 4 weeks ago. Pt reports a history of a Zofran pump and NG tube in past pregnancies for hyperemesis. Patient reports she is currently taking Phenergan 25mg  q6 hours (last took last night) and Diclegis 2 tablets at night. Patient reports vomiting "more than 10" times in the past 24 hours. Patient reports she vomits 10 minutes after anything she tries to eat. Patient reports she tried eating plain noodles with chicken broth and mashed potatoes last night. Patient reports she is still trying to "eat everything" but she cannot get anything she eats or drinks to stay down for more than 10 minutes.  Pt denies VB, LOF, ctx, vaginal discharge/odor/itching. Pt denies abdominal pain, constipation, diarrhea, or urinary problems. Pt denies fever, chills, fatigue, sweating or changes in appetite. Pt denies SOB or chest pain. Pt denies dizziness, HA, light-headedness, weakness.  Problems this pregnancy include: N/V, MS. Allergies? NKDA Current medications/supplements? Diclegis, Phenergan Prenatal care provider? Pt will switch to [facility] for prenatal care as [facility] does not take her Medicaid   OB History    Gravida  7   Para  3   Term  1   Preterm  2   AB  3   Living  4     SAB      TAB  3   Ectopic      Multiple  1   Live Births  2           Past Medical History:  Diagnosis Date  . Abnormal Pap smear    Colpo>normal  . Gonorrhea   . Headache    last migraine 01/2017  . History of IBS    watch diet, no meds  . Multiple sclerosis ([facility])   . Neuromuscular disorder [facility])    diagnosed with MS this visit  . Obesity   . Ovarian cyst    . Pernicious anemia 05/02/2013  . Pregnancy induced hypertension   . Preterm labor    all 3 deliveries  . SVD (spontaneous vaginal delivery)    x 3  . Urinary tract infection   . Vaginal Pap smear, abnormal     Past Surgical History:  Procedure Laterality Date  . CHOLECYSTECTOMY N/A 11/25/2017   Procedure: LAPAROSCOPIC CHOLECYSTECTOMY WITH INTRAOPERATIVE CHOLANGIOGRAM;  Surgeon: Armandina Gemma, MD;  Location: [facility];  Service: General;  Laterality: N/A;  . COLPOSCOPY    . MYOMECTOMY N/A 04/01/2017   Procedure: Vaginal Myomectomy;  Surgeon: Osborne Oman, MD;  Location: [facility];  Service: Gynecology;  Laterality: N/A;  . TUBAL LIGATION     2012  . WISDOM TOOTH EXTRACTION      Family History  Problem Relation Age of Onset  . Diabetes Mother   . Hypertension Mother   . Diabetes Father   . Hypertension Father   . Anesthesia problems Neg Hx   . Other Neg Hx     Social History   Tobacco Use  . Smoking status: Never Smoker  . Smokeless tobacco: Never Used  Vaping Use  . Vaping Use: Never used  Substance Use Topics  . Alcohol use:  No    Alcohol/week: 0.0 standard drinks    Comment:    . Drug use: No    Allergies: No Known Allergies  Medications Prior to Admission  Medication Sig Dispense Refill Last Dose  . Doxylamine-Pyridoxine 10-10 MG TBEC Take 2 tablets by mouth at bedtime. 60 tablet 4 01/29/2020 at Unknown time  . metoCLOPramide (REGLAN) 10 MG tablet Take 1 tablet (10 mg total) by mouth 4 (four) times daily as needed for nausea or vomiting. 30 tablet 2 01/29/2020 at Unknown time  . promethazine (PHENERGAN) 12.5 MG tablet Take 1-2 tablets (12.5-25 mg total) by mouth every 6 (six) hours as needed for nausea or vomiting. 30 tablet 2 01/29/2020 at Unknown time  . scopolamine (TRANSDERM-SCOP) 1 MG/3DAYS Place 1 patch (1.5 mg total) onto the skin every 3 (three) days. 10 patch 0 01/29/2020 at Unknown time  . [DISCONTINUED] ondansetron (ZOFRAN-ODT) 4 MG disintegrating tablet  Take 4 mg by mouth every 8 (eight) hours as needed for nausea or vomiting.   01/29/2020 at Unknown time  . sertraline (ZOLOFT) 25 MG tablet TAKE 1 TABLET BY MOUTH EVERY DAY 90 tablet 1  at not taking    Review of Systems  Constitutional: Negative for chills, diaphoresis, fatigue and fever.  Eyes: Negative for visual disturbance.  Respiratory: Negative for shortness of breath.   Cardiovascular: Negative for chest pain.  Gastrointestinal: Positive for nausea and vomiting. Negative for abdominal pain, constipation and diarrhea.  Genitourinary: Negative for dysuria, flank pain, frequency, pelvic pain, urgency, vaginal bleeding and vaginal discharge.  Neurological: Negative for dizziness, weakness, light-headedness and headaches.   Physical Exam   Blood pressure 122/67, pulse 68, temperature 98.7 F (37.1 C), temperature source Oral, resp. rate 20, height 5\' 4"  (1.626 m), weight 105.6 kg, last menstrual period 11/17/2019, SpO2 100 %, unknown if currently breastfeeding.  Patient Vitals for the past 24 hrs:  BP Temp Temp src Pulse Resp SpO2 Height Weight  01/30/20 1227 122/67 98.7 F (37.1 C) Oral 68 20 100 % 5\' 4"  (1.626 m) 105.6 kg   Physical Exam Vitals and nursing note reviewed.  Constitutional:      General: She is not in acute distress.    Appearance: Normal appearance. She is not ill-appearing, toxic-appearing or diaphoretic.  HENT:     Head: Normocephalic and atraumatic.  Pulmonary:     Effort: Pulmonary effort is normal.  Neurological:     Mental Status: She is alert and oriented to person, place, and time.  Psychiatric:        Mood and Affect: Mood normal.        Behavior: Behavior normal.        Thought Content: Thought content normal.        Judgment: Judgment normal.    Results for orders placed or performed during the hospital encounter of 01/30/20 (from the past 24 hour(s))  Urinalysis, Routine w reflex microscopic Urine, Clean Catch     Status: Abnormal   Collection  Time: 01/30/20 12:49 PM  Result Value Ref Range   Color, Urine YELLOW YELLOW   APPearance HAZY (A) CLEAR   Specific Gravity, Urine 1.025 1.005 - 1.030   pH 5.0 5.0 - 8.0   Glucose, UA NEGATIVE NEGATIVE mg/dL   Hgb urine dipstick MODERATE (A) NEGATIVE   Bilirubin Urine NEGATIVE NEGATIVE   Ketones, ur 80 (A) NEGATIVE mg/dL   Protein, ur 30 (A) NEGATIVE mg/dL   Nitrite NEGATIVE NEGATIVE   Leukocytes,Ua NEGATIVE NEGATIVE   RBC /  HPF 11-20 0 - 5 RBC/hpf   WBC, UA 0-5 0 - 5 WBC/hpf   Bacteria, UA NONE SEEN NONE SEEN   Squamous Epithelial / LPF 6-10 0 - 5   Mucus PRESENT   Urine rapid drug screen (hosp performed)     Status: Abnormal   Collection Time: 01/30/20  1:34 PM  Result Value Ref Range   Opiates NONE DETECTED NONE DETECTED   Cocaine NONE DETECTED NONE DETECTED   Benzodiazepines NONE DETECTED NONE DETECTED   Amphetamines NONE DETECTED NONE DETECTED   Tetrahydrocannabinol POSITIVE (A) NONE DETECTED   Barbiturates NONE DETECTED NONE DETECTED  CBC     Status: Abnormal   Collection Time: 01/30/20  1:36 PM  Result Value Ref Range   WBC 6.6 4.0 - 10.5 K/uL   RBC 3.58 (L) 3.87 - 5.11 MIL/uL   Hemoglobin 10.8 (L) 12.0 - 15.0 g/dL   HCT 34.1 (L) 36 - 46 %   MCV 95.3 80.0 - 100.0 fL   MCH 30.2 26.0 - 34.0 pg   MCHC 31.7 30.0 - 36.0 g/dL   RDW 12.1 11.5 - 15.5 %   Platelets 414 (H) 150 - 400 K/uL   nRBC 0.0 0.0 - 0.2 %  Comprehensive metabolic panel     Status: Abnormal   Collection Time: 01/30/20  1:36 PM  Result Value Ref Range   Sodium 134 (L) 135 - 145 mmol/L   Potassium 3.7 3.5 - 5.1 mmol/L   Chloride 100 98 - 111 mmol/L   CO2 23 22 - 32 mmol/L   Glucose, Bld 85 70 - 99 mg/dL   BUN 5 (L) 6 - 20 mg/dL   Creatinine, Ser 0.72 0.44 - 1.00 mg/dL   Calcium 9.4 8.9 - 10.3 mg/dL   Total Protein 6.9 6.5 - 8.1 g/dL   Albumin 3.4 (L) 3.5 - 5.0 g/dL   AST 18 15 - 41 U/L   ALT 17 0 - 44 U/L   Alkaline Phosphatase 42 38 - 126 U/L   Total Bilirubin 0.8 0.3 - 1.2 mg/dL   GFR calc  non Af Amer >60 >60 mL/min   GFR calc Af Amer >60 >60 mL/min   Anion gap 11 5 - 15   US OB Comp Less 14 Wks  Result Date: 01/23/2020 CLINICAL DATA:  Vaginal bleeding. Patient is 9 weeks and 4 days pregnant based on her last menstrual period. EXAM: OBSTETRIC <14 WK ULTRASOUND TECHNIQUE: Transabdominal ultrasound was performed for evaluation of the gestation as well as the maternal uterus and adnexal regions. COMPARISON:  None. FINDINGS: Intrauterine gestational sac: Single Yolk sac:  Visualized. Embryo:  Visualized. Cardiac Activity: Visualized. Heart Rate: 176 bpm CRL:   2.53 cm   9 w 1 d                  [place] EDC: 08/26/2020 Subchorionic hemorrhage:  None visualized. Maternal uterus/adnexae: No uterine masses. No adnexal masses. Right ovary not visualized. Normal left ovary. No pelvic free fluid. IMPRESSION: 1. Single live intrauterine pregnancy with a measured gestational age of [redacted] weeks and day, consistent with the expected gestational age based on the last menstrual period. 2. No pregnancy complication. Electronically Signed   By: Lajean Manes M.D.   On: 01/23/2020 20:53   US OB Transvaginal  Result Date: 01/05/2020 CLINICAL DATA:  Vaginal bleeding EXAM: TRANSVAGINAL OB ULTRASOUND TECHNIQUE: Transvaginal ultrasound was performed for complete evaluation of the gestation as well as the maternal uterus, adnexal regions, and pelvic cul-de-sac.  COMPARISON:  12/20/2019 FINDINGS: Intrauterine gestational sac: Present, single Yolk sac:  Not identified Embryo:  Present, single Cardiac Activity: Present Heart Rate: 133 bpm MSD:   Appropriate in relation to the size of the fetus CRL:   12.5 mm   7 w in 3 d                  [place] EDC: 08/20/2020 Subchorionic hemorrhage:  None visualized. Maternal uterus/adnexae: The uterus is anteverted. No intrauterine masses are seen. The cervix is closed though not well assessed on this examination. The placenta is not well visualized given the early phase of gestation. There is no  free fluid within the cul-de-sac. The maternal ovaries are normal in size and echogenicity. IMPRESSION: Interval development of an intrauterine gestational sac and a single fetus. Single living fetus estimated gestational age [redacted] weeks 3 days with expected date of delivery of 08/20/2020. Electronically Signed   By: Fidela Salisbury MD   On: 01/05/2020 20:03    MAU Course  Procedures  MDM -N/V in pregnancy -weight today 105.6kg, 1.1kg weight loss since 01/14/2020 -UA: hazy/mod hgb/80ketones/30PRO, sending urine for culture -UDS: +THC -CBC: H/H 10.8/34.1, platelets 414 -CMP: no abnormalities requiring treatment -1L LR + 40mg  Protonix + 8mg  Zofran + Scopolamine patch given, pt reports NV now resolved -pt able to urinate after fluids -PO challenge successful -pt discharged to home in stable condition  Orders Placed This Encounter  Procedures  . Culture, OB Urine    Standing Status:   Standing    Number of Occurrences:   1  . Urinalysis, Routine w reflex microscopic Urine, Clean Catch    Standing Status:   Standing    Number of Occurrences:   1  . Urine rapid drug screen (hosp performed)    Standing Status:   Standing    Number of Occurrences:   1  . CBC    Standing Status:   Standing    Number of Occurrences:   1  . Comprehensive metabolic panel    Standing Status:   Standing    Number of Occurrences:   1  . Insert peripheral IV    Standing Status:   Standing    Number of Occurrences:   1  . Discharge patient    Order Specific Question:   Discharge disposition    Answer:   01-Home or Self Care [1]    Order Specific Question:   Discharge patient date    Answer:   01/30/2020   Meds ordered this encounter  Medications  . lactated ringers bolus 1,000 mL  . ondansetron (ZOFRAN) 8 mg in sodium chloride 0.9 % 50 mL IVPB  . pantoprazole (PROTONIX) injection 40 mg  . scopolamine (TRANSDERM-SCOP) 1 MG/3DAYS 1.5 mg  . scopolamine (TRANSDERM-SCOP) 1 MG/3DAYS    Sig: Place 1 patch (1.5  mg total) onto the skin every 3 (three) days.    Dispense:  10 patch    Refill:  12    Order Specific Question:   Supervising Provider    Answer:   Truett Mainland [[id number]]  . pantoprazole (PROTONIX) 20 MG tablet    Sig: Take 1 tablet (20 mg total) by mouth daily.    Dispense:  30 tablet    Refill:  1    Order Specific Question:   Supervising Provider    Answer:   Truett Mainland [[id number]]  . ondansetron (ZOFRAN-ODT) 4 MG disintegrating tablet    Sig: Take 1 tablet (4 mg  total) by mouth every 8 (eight) hours as needed for nausea or vomiting.    Dispense:  30 tablet    Refill:  1    Order Specific Question:   Supervising Provider    Answer:   Truett Mainland [[id number]]    Assessment and Plan   1. Nausea and vomiting in pregnancy   2. Marijuana use     Allergies as of 01/30/2020   No Known Allergies     Medication List    STOP taking these medications   Doxylamine-Pyridoxine 10-10 MG Tbec   metoCLOPramide 10 MG tablet Commonly known as: REGLAN     TAKE these medications   ondansetron 4 MG disintegrating tablet Commonly known as: ZOFRAN-ODT Take 1 tablet (4 mg total) by mouth every 8 (eight) hours as needed for nausea or vomiting.   pantoprazole 20 MG tablet Commonly known as: Protonix Take 1 tablet (20 mg total) by mouth daily.   promethazine 12.5 MG tablet Commonly known as: PHENERGAN Take 1-2 tablets (12.5-25 mg total) by mouth every 6 (six) hours as needed for nausea or vomiting.   scopolamine 1 MG/3DAYS Commonly known as: TRANSDERM-SCOP Place 1 patch (1.5 mg total) onto the skin every 3 (three) days. Start taking on: February 02, 2020   sertraline 25 MG tablet Commonly known as: ZOLOFT TAKE 1 TABLET BY MOUTH EVERY DAY       -will call with culture results, if positive -pt to make OB appt tomorrow with new office of choice -pt advised not to restart smoking marijuana for N/V or other reason as this can worsen symptoms of N/V, information given on cannabis  hyperemesis -pt advised to take medications around the clock and not to stop taking if feeling better -RX Protonix -RX Scopolamine -RX Zofran -Phenergan PRN, pt has at home -d/c Diclegis -discussed nonpharmacologic and pharmacologic treatments of N/V -discussed normal expectations for N/V in pregnancy -pt discharged to home in stable condition  Elmyra Ricks E Yoshie Kosel 01/30/2020, 4:02 PM

## 2020-02-01 ENCOUNTER — Inpatient Hospital Stay (HOSPITAL_COMMUNITY)
Admission: AD | Admit: 2020-02-01 | Discharge: 2020-02-01 | Disposition: A | Discharge status: Home / Self Care | Payer: Medicare HMO | Attending: Obstetrics and Gynecology | Admitting: Obstetrics and Gynecology

## 2020-02-01 ENCOUNTER — Encounter (HOSPITAL_COMMUNITY): Payer: Self-pay | Admitting: Obstetrics and Gynecology

## 2020-02-01 ENCOUNTER — Other Ambulatory Visit: Payer: Self-pay

## 2020-02-01 DIAGNOSIS — O99351 Diseases of the nervous system complicating pregnancy, first trimester: Secondary | ICD-10-CM | POA: Diagnosis not present

## 2020-02-01 DIAGNOSIS — Z9049 Acquired absence of other specified parts of digestive tract: Secondary | ICD-10-CM | POA: Insufficient documentation

## 2020-02-01 DIAGNOSIS — O210 Mild hyperemesis gravidarum: Secondary | ICD-10-CM | POA: Insufficient documentation

## 2020-02-01 DIAGNOSIS — Z3A10 10 weeks gestation of pregnancy: Secondary | ICD-10-CM | POA: Diagnosis not present

## 2020-02-01 DIAGNOSIS — O99211 Obesity complicating pregnancy, first trimester: Secondary | ICD-10-CM | POA: Diagnosis not present

## 2020-02-01 DIAGNOSIS — G35 Multiple sclerosis: Secondary | ICD-10-CM | POA: Diagnosis not present

## 2020-02-01 DIAGNOSIS — E669 Obesity, unspecified: Secondary | ICD-10-CM | POA: Diagnosis not present

## 2020-02-01 DIAGNOSIS — Z79899 Other long term (current) drug therapy: Secondary | ICD-10-CM | POA: Diagnosis not present

## 2020-02-01 HISTORY — DX: Benign neoplasm of connective and other soft tissue, unspecified: D21.9

## 2020-02-01 HISTORY — DX: Unspecified infectious disease: B99.9

## 2020-02-01 LAB — CULTURE, OB URINE

## 2020-02-01 LAB — URINALYSIS, ROUTINE W REFLEX MICROSCOPIC
Bilirubin Urine: NEGATIVE
Glucose, UA: NEGATIVE mg/dL
Ketones, ur: 20 mg/dL — AB
Nitrite: NEGATIVE
Protein, ur: 30 mg/dL — AB
Specific Gravity, Urine: 1.025 (ref 1.005–1.030)
pH: 5 (ref 5.0–8.0)

## 2020-02-01 LAB — COMPREHENSIVE METABOLIC PANEL
ALT: 27 U/L (ref 0–44)
AST: 23 U/L (ref 15–41)
Albumin: 3.2 g/dL — ABNORMAL LOW (ref 3.5–5.0)
Alkaline Phosphatase: 36 U/L — ABNORMAL LOW (ref 38–126)
Anion gap: 8 (ref 5–15)
BUN: <5 mg/dL — ABNORMAL LOW (ref 6–20)
CO2: 24 mmol/L (ref 22–32)
Calcium: 9.3 mg/dL (ref 8.9–10.3)
Chloride: 100 mmol/L (ref 98–111)
Creatinine, Ser: 0.82 mg/dL (ref 0.44–1.00)
GFR calc Af Amer: >60 mL/min (ref >60)
GFR calc non Af Amer: >60 mL/min (ref >60)
Glucose, Bld: 90 mg/dL (ref 70–99)
Potassium: 3.7 mmol/L (ref 3.5–5.1)
Sodium: 132 mmol/L — ABNORMAL LOW (ref 135–145)
Total Bilirubin: 0.6 mg/dL (ref 0.3–1.2)
Total Protein: 6.7 g/dL (ref 6.5–8.1)

## 2020-02-01 MED ORDER — METHYLPREDNISOLONE 4 MG PO TBPK
ORAL_TABLET | ORAL | 0 refills | Status: DC
Start: 2020-02-01 — End: 2020-02-10

## 2020-02-01 MED ORDER — METHYLPREDNISOLONE SODIUM SUCC 125 MG IJ SOLR
80.0000 mg | Freq: Once | INTRAMUSCULAR | Status: AC
  Administered 2020-02-01: 80 mg via INTRAVENOUS
  Filled 2020-02-01: qty 2

## 2020-02-01 MED ORDER — LACTATED RINGERS IV BOLUS
1000.0000 mL | Freq: Once | INTRAVENOUS | Status: AC
  Administered 2020-02-01: 1000 mL via INTRAVENOUS

## 2020-02-01 MED ORDER — PROMETHAZINE HCL 25 MG/ML IJ SOLN
25.0000 mg | Freq: Once | INTRAMUSCULAR | Status: AC
  Administered 2020-02-01: 25 mg via INTRAMUSCULAR
  Filled 2020-02-01: qty 1

## 2020-02-01 MED ORDER — METOCLOPRAMIDE HCL 10 MG PO TABS
10.0000 mg | ORAL_TABLET | Freq: Three times a day (TID) | ORAL | 0 refills | Status: DC
Start: 2020-02-01 — End: 2020-06-26

## 2020-02-01 MED ORDER — SODIUM CHLORIDE 0.9 % IV SOLN
25.0000 mg | Freq: Once | INTRAVENOUS | Status: DC
  Filled 2020-02-01: qty 1

## 2020-02-01 NOTE — MAU Provider Note (Signed)
History     CSN: [id number]  Arrival date and time: 02/01/20 1220   First Provider Initiated Contact with Patient 02/01/20 1458      Chief Complaint  Patient presents with  . Emesis  . Dizziness   HPI  [name] IS A 34 y.o. female [phone] @ [redacted]w[redacted]d here in MAU with hyperemesis. She has had multiple visits to the MAU with this complaint. States she has tried every single medication that is available to her and nothing works. States she is not able to keep anything down.  She declines marijuana use; states 2 weeks ago she was given a CBD gummy which did not help her symptoms. States she has never smoked marijuana. She has no bleeding or lower abdominal pain.  She is requesting to start steroids for hyperemesis.   OB History    Gravida  7   Para  3   Term  0   Preterm  3   AB  3   Living  4     SAB      TAB  3   Ectopic      Multiple  1   Live Births  4        Obstetric Comments  Induced early due to Pre- E with all prenancies        Past Medical History:  Diagnosis Date  . Abnormal Pap smear    Colpo>normal  . Fibroid   . Gonorrhea   . Headache    last migraine 01/2017  . History of IBS    watch diet, no meds  . Infection    UTI  . Multiple sclerosis ([place])   . Neuromuscular disorder [facility])    diagnosed with MS this visit  . Obesity   . Ovarian cyst   . Pernicious anemia 05/02/2013  . Pregnancy induced hypertension   . Preterm labor    all 3 deliveries  . SVD (spontaneous vaginal delivery)    x 3  . Urinary tract infection   . Vaginal Pap smear, abnormal     Past Surgical History:  Procedure Laterality Date  . CHOLECYSTECTOMY N/A 11/25/2017   Procedure: LAPAROSCOPIC CHOLECYSTECTOMY WITH INTRAOPERATIVE CHOLANGIOGRAM;  Surgeon: Armandina Gemma, MD;  Location: [facility];  Service: General;  Laterality: N/A;  . COLPOSCOPY    . MYOMECTOMY N/A 04/01/2017   Procedure: Vaginal Myomectomy;  Surgeon: Osborne Oman, MD;  Location: [place];   Service: Gynecology;  Laterality: N/A;  . TUBAL LIGATION     2012/ reversal June 2020  . WISDOM TOOTH EXTRACTION      Family History  Problem Relation Age of Onset  . Diabetes Mother   . Hypertension Mother   . Diabetes Father   . Hypertension Father   . Anesthesia problems Neg Hx   . Other Neg Hx     Social History   Tobacco Use  . Smoking status: Never Smoker  . Smokeless tobacco: Never Used  Vaping Use  . Vaping Use: Never used  Substance Use Topics  . Alcohol use: No    Alcohol/week: 0.0 standard drinks    Comment:    . Drug use: No    Comment: denies use, states was given CBD gummies for nausea    Allergies: No Known Allergies  Medications Prior to Admission  Medication Sig Dispense Refill Last Dose  . ondansetron (ZOFRAN-ODT) 4 MG disintegrating tablet Take 1 tablet (4 mg total) by mouth every 8 (eight) hours as needed for  nausea or vomiting. 30 tablet 1 02/01/2020 at Unknown time  . pantoprazole (PROTONIX) 20 MG tablet Take 1 tablet (20 mg total) by mouth daily. 30 tablet 1 02/01/2020 at Unknown time  . promethazine (PHENERGAN) 12.5 MG tablet Take 1-2 tablets (12.5-25 mg total) by mouth every 6 (six) hours as needed for nausea or vomiting. 30 tablet 2 01/31/2020 at Unknown time  . [START ON 02/02/2020] scopolamine (TRANSDERM-SCOP) 1 MG/3DAYS Place 1 patch (1.5 mg total) onto the skin every 3 (three) days. 10 patch 12 Past Week at Unknown time  . sertraline (ZOLOFT) 25 MG tablet TAKE 1 TABLET BY MOUTH EVERY DAY 90 tablet 1    Results for orders placed or performed during the hospital encounter of 02/01/20 (from the past 48 hour(s))  Urinalysis, Routine w reflex microscopic Urine, Clean Catch     Status: Abnormal   Collection Time: 02/01/20  1:00 PM  Result Value Ref Range   Color, Urine AMBER (A) YELLOW    Comment: BIOCHEMICALS MAY BE AFFECTED BY COLOR   APPearance HAZY (A) CLEAR   Specific Gravity, Urine 1.025 1.005 - 1.030   pH 5.0 5.0 - 8.0   Glucose, UA  NEGATIVE NEGATIVE mg/dL   Hgb urine dipstick MODERATE (A) NEGATIVE   Bilirubin Urine NEGATIVE NEGATIVE   Ketones, ur 20 (A) NEGATIVE mg/dL   Protein, ur 30 (A) NEGATIVE mg/dL   Nitrite NEGATIVE NEGATIVE   Leukocytes,Ua SMALL (A) NEGATIVE   RBC / HPF 11-20 0 - 5 RBC/hpf   WBC, UA 11-20 0 - 5 WBC/hpf   Bacteria, UA FEW (A) NONE SEEN   Squamous Epithelial / LPF 6-10 0 - 5   Mucus PRESENT    Hyaline Casts, UA PRESENT     Comment: Performed at [facility], [address], [place], [place] [address]  Comprehensive metabolic panel     Status: Abnormal   Collection Time: 02/01/20  2:13 PM  Result Value Ref Range   Sodium 132 (L) 135 - 145 mmol/L   Potassium 3.7 3.5 - 5.1 mmol/L   Chloride 100 98 - 111 mmol/L   CO2 24 22 - 32 mmol/L   Glucose, Bld 90 70 - 99 mg/dL    Comment: Glucose reference range applies only to samples taken after fasting for at least 8 hours.   BUN <5 (L) 6 - 20 mg/dL   Creatinine, Ser 0.82 0.44 - 1.00 mg/dL   Calcium 9.3 8.9 - 10.3 mg/dL   Total Protein 6.7 6.5 - 8.1 g/dL   Albumin 3.2 (L) 3.5 - 5.0 g/dL   AST 23 15 - 41 U/L   ALT 27 0 - 44 U/L   Alkaline Phosphatase 36 (L) 38 - 126 U/L   Total Bilirubin 0.6 0.3 - 1.2 mg/dL   GFR calc non Af Amer >60 >60 mL/min   GFR calc Af Amer >60 >60 mL/min   Anion gap 8 5 - 15    Comment: Performed at [facility] [address] [place], [place] [address]   Review of Systems  Constitutional: Negative for fever.  Gastrointestinal: Positive for nausea and vomiting. Negative for abdominal pain.  Genitourinary: Negative for dysuria, vaginal bleeding and vaginal discharge.   Physical Exam   Blood pressure 124/62, pulse 68, temperature 97.7 F (36.5 C), temperature source Axillary, resp. rate 18, height 5\' 4"  (1.626 m), weight 104.6 kg, last menstrual period 11/17/2019, SpO2 98 %, unknown if currently breastfeeding.  Physical Exam Constitutional:  Appearance: Normal appearance. She is not ill-appearing  or toxic-appearing.  HENT:     Head: Normocephalic.  Eyes:     Pupils: Pupils are equal, round, and reactive to light.  Pulmonary:     Effort: Pulmonary effort is normal.  Neurological:     General: No focal deficit present.     Mental Status: She is alert.  Psychiatric:        Mood and Affect: Mood normal.    MAU Course  Procedures  None  MDM  LR bolus X 1- Nurse reports IV blew, and patient is a hard stick Phenergan 25 mg IM given Patient tolerating oral fluids & crackers  Solumedrol given IV in MAU.   Weights: 7/7: 109 kg 8/19 104 kg   Assessment and Plan   A:  1. Hyperemesis arising during pregnancy   2. [redacted] weeks gestation of pregnancy     P:  Discharge home in stable condition Discussed using reglan/phenergan vaginally. Steroid taper given.  Return to MAU if symptoms worsen  Alvey Brockel, Artist Pais, NP 02/01/2020 8:05 PM

## 2020-02-01 NOTE — MAU Note (Signed)
Kim (phleb) called, notified unable to get blood work w/IV start

## 2020-02-01 NOTE — Discharge Instructions (Signed)
Hyperemesis Gravidarum Hyperemesis gravidarum is a severe form of nausea and vomiting that happens during pregnancy. Hyperemesis is worse than morning sickness. It may cause you to have nausea or vomiting all day for many days. It may keep you from eating and drinking enough food and liquids, which can lead to dehydration, malnutrition, and weight loss. Hyperemesis usually occurs during the first half (the first 20 weeks) of pregnancy. It often goes away once a woman is in her second half of pregnancy. However, sometimes hyperemesis continues through an entire pregnancy. What are the causes? The cause of this condition is not known. It may be related to changes in chemicals (hormones) in the body during pregnancy, such as the high level of pregnancy hormone (human chorionic gonadotropin) or the increase in the female sex hormone (estrogen). What are the signs or symptoms? Symptoms of this condition include:  Nausea that does not go away.  Vomiting that does not allow you to keep any food down.  Weight loss.  Body fluid loss (dehydration).  Having no desire to eat, or not liking food that you have previously enjoyed. How is this diagnosed? This condition may be diagnosed based on:  A physical exam.  Your medical history.  Your symptoms.  Blood tests.  Urine tests. How is this treated? This condition is managed by controlling symptoms. This may include:  Following an eating plan. This can help lessen nausea and vomiting.  Taking prescription medicines. An eating plan and medicines are often used together to help control symptoms. If medicines do not help relieve nausea and vomiting, you may need to receive fluids through an IV at the hospital. Follow these instructions at home: Eating and drinking   Avoid the following: ? Drinking fluids with meals. Try not to drink anything during the 30 minutes before and after your meals. ? Drinking more than 1 cup of fluid at a  time. ? Eating foods that trigger your symptoms. These may include spicy foods, coffee, high-fat foods, very sweet foods, and acidic foods. ? Skipping meals. Nausea can be more intense on an empty stomach. If you cannot tolerate food, do not force it. Try sucking on ice chips or other frozen items and make up for missed calories later. ? Lying down within 2 hours after eating. ? Being exposed to environmental triggers. These may include food smells, smoky rooms, closed spaces, rooms with strong smells, warm or humid places, overly loud and noisy rooms, and rooms with motion or flickering lights. Try eating meals in a well-ventilated area that is free of strong smells. ? Quick and sudden changes in your movement. ? Taking iron pills and multivitamins that contain iron. If you take prescription iron pills, do not stop taking them unless your health care provider approves. ? Preparing food. The smell of food can spoil your appetite or trigger nausea.  To help relieve your symptoms: ? Listen to your body. Everyone is different and has different preferences. Find what works best for you. ? Eat and drink slowly. ? Eat 5-6 small meals daily instead of 3 large meals. Eating small meals and snacks can help you avoid an empty stomach. ? In the morning, before getting out of bed, eat a couple of crackers to avoid moving around on an empty stomach. ? Try eating starchy foods as these are usually tolerated well. Examples include cereal, toast, bread, potatoes, pasta, rice, and pretzels. ? Include at least 1 serving of protein with your meals and snacks. Protein options include   lean meats, poultry, seafood, beans, nuts, nut butters, eggs, cheese, and yogurt. ? Try eating a protein-rich snack before bed. Examples of a protein-rick snack include cheese and crackers or a peanut butter sandwich made with 1 slice of whole-wheat bread and 1 tsp (5 g) of peanut butter. ? Eat or suck on things that have ginger in them.  It may help relieve nausea. Add  tsp ground ginger to hot tea or choose ginger tea. ? Try drinking 100% fruit juice or an electrolyte drink. An electrolyte drink contains sodium, potassium, and chloride. ? Drink fluids that are cold, clear, and carbonated or sour. Examples include lemonade, ginger ale, lemon-lime soda, ice water, and sparkling water. ? Brush your teeth or use a mouth rinse after meals. ? Talk with your health care provider about starting a supplement of vitamin B6. General instructions  Take over-the-counter and prescription medicines only as told by your health care provider.  Follow instructions from your health care provider about eating or drinking restrictions.  Continue to take your prenatal vitamins as told by your health care provider. If you are having trouble taking your prenatal vitamins, talk with your health care provider about different options.  Keep all follow-up and pre-birth (prenatal) visits as told by your health care provider. This is important. Contact a health care provider if:  You have pain in your abdomen.  You have a severe headache.  You have vision problems.  You are losing weight.  You feel weak or dizzy. Get help right away if:  You cannot drink fluids without vomiting.  You vomit blood.  You have constant nausea and vomiting.  You are very weak.  You faint.  You have a fever and your symptoms suddenly get worse. Summary  Hyperemesis gravidarum is a severe form of nausea and vomiting that happens during pregnancy.  Making some changes to your eating habits may help relieve nausea and vomiting.  This condition may be managed with medicine.  If medicines do not help relieve nausea and vomiting, you may need to receive fluids through an IV at the hospital. This information is not intended to replace advice given to you by your health care provider. Make sure you discuss any questions you have with your health care  provider. Document Revised: 06/21/2017 Document Reviewed: 01/29/2016 Elsevier Patient Education  2020 [employer]  

## 2020-02-01 NOTE — MAU Note (Signed)
Was here on Tues, given IV fluids and zofran.  Still throwing up, can't keep anything down. None of the meds are working. Feels faint and dizzy.  "is down 15# since found out she is pregnant."

## 2020-02-07 ENCOUNTER — Encounter (HOSPITAL_COMMUNITY): Payer: Self-pay | Admitting: Obstetrics and Gynecology

## 2020-02-07 ENCOUNTER — Other Ambulatory Visit: Payer: Self-pay

## 2020-02-07 ENCOUNTER — Inpatient Hospital Stay (HOSPITAL_COMMUNITY)
Admission: AD | Admit: 2020-02-07 | Discharge: 2020-02-07 | Disposition: A | Discharge status: Home / Self Care | Payer: Medicare HMO | Attending: Obstetrics and Gynecology | Admitting: Obstetrics and Gynecology

## 2020-02-07 DIAGNOSIS — Z9049 Acquired absence of other specified parts of digestive tract: Secondary | ICD-10-CM | POA: Insufficient documentation

## 2020-02-07 DIAGNOSIS — K589 Irritable bowel syndrome without diarrhea: Secondary | ICD-10-CM | POA: Diagnosis not present

## 2020-02-07 DIAGNOSIS — O99211 Obesity complicating pregnancy, first trimester: Secondary | ICD-10-CM | POA: Diagnosis not present

## 2020-02-07 DIAGNOSIS — Z8759 Personal history of other complications of pregnancy, childbirth and the puerperium: Secondary | ICD-10-CM | POA: Diagnosis not present

## 2020-02-07 DIAGNOSIS — Z87440 Personal history of urinary (tract) infections: Secondary | ICD-10-CM | POA: Diagnosis not present

## 2020-02-07 DIAGNOSIS — O09211 Supervision of pregnancy with history of pre-term labor, first trimester: Secondary | ICD-10-CM | POA: Insufficient documentation

## 2020-02-07 DIAGNOSIS — O99611 Diseases of the digestive system complicating pregnancy, first trimester: Secondary | ICD-10-CM

## 2020-02-07 DIAGNOSIS — E669 Obesity, unspecified: Secondary | ICD-10-CM | POA: Insufficient documentation

## 2020-02-07 DIAGNOSIS — K5909 Other constipation: Secondary | ICD-10-CM | POA: Diagnosis not present

## 2020-02-07 DIAGNOSIS — Z3A11 11 weeks gestation of pregnancy: Secondary | ICD-10-CM | POA: Diagnosis not present

## 2020-02-07 DIAGNOSIS — Z8249 Family history of ischemic heart disease and other diseases of the circulatory system: Secondary | ICD-10-CM | POA: Diagnosis not present

## 2020-02-07 DIAGNOSIS — Z79899 Other long term (current) drug therapy: Secondary | ICD-10-CM | POA: Diagnosis not present

## 2020-02-07 DIAGNOSIS — O99351 Diseases of the nervous system complicating pregnancy, first trimester: Secondary | ICD-10-CM | POA: Insufficient documentation

## 2020-02-07 DIAGNOSIS — Z7952 Long term (current) use of systemic steroids: Secondary | ICD-10-CM | POA: Diagnosis not present

## 2020-02-07 DIAGNOSIS — G35 Multiple sclerosis: Secondary | ICD-10-CM | POA: Insufficient documentation

## 2020-02-07 DIAGNOSIS — K5900 Constipation, unspecified: Secondary | ICD-10-CM | POA: Insufficient documentation

## 2020-02-07 LAB — URINALYSIS, ROUTINE W REFLEX MICROSCOPIC
Bilirubin Urine: NEGATIVE
Glucose, UA: 50 mg/dL — AB
Hgb urine dipstick: NEGATIVE
Ketones, ur: NEGATIVE mg/dL
Leukocytes,Ua: NEGATIVE
Nitrite: NEGATIVE
Protein, ur: NEGATIVE mg/dL
Specific Gravity, Urine: 1.027 (ref 1.005–1.030)
pH: 6 (ref 5.0–8.0)

## 2020-02-07 MED ORDER — DOCUSATE SODIUM 100 MG PO CAPS
100.0000 mg | ORAL_CAPSULE | Freq: Two times a day (BID) | ORAL | 2 refills | Status: DC | PRN
Start: 2020-02-07 — End: 2020-06-26

## 2020-02-07 NOTE — MAU Note (Signed)
Presents with c/o abdominal cramping & spotting.  States began yesterday.  Denies recent intercourse.

## 2020-02-07 NOTE — MAU Provider Note (Signed)
History     CSN: [id number]  Arrival date and time: 02/07/20 0930   First Provider Initiated Contact with Patient 02/07/20 1008      Abdominal Pain   HPI   Patient is a [id number] at [redacted]w[redacted]d who presents to MAU for cramping and spoting. She reports that she had some lower abdominal cramping yesterday and today she noticed a few spots of blood when she was wiping. She is not sure if it came from her vagina. On further questioning she endorses being extremely constipated and having to push very hard to have a BM yesterday. She says the stool was very hard and it was painful coming out. She denies any large volume blood or any drops of blood in the toilet. She states that she does not like drinking water because it makes her feel 'gaggy.' She drinks primarily grapefruit juice and ginger ale. She does not take any stool softeners or laxatives.  Describes cramping as mild, intermittent and only in lower abdomen. Denies pain with urination. No recent intercourse.    Patient has been seen multiple times at MAU in past month for nausea/vomiting. She was started on a steroid taper and says that this has been the most effective treatment she has ever received for hyperemesis.  Has been tolerating PO more now that she is on steroids.        Past Medical History:  Diagnosis Date  . Abnormal Pap smear    Colpo>normal  . Fibroid   . Gonorrhea   . Headache    last migraine 01/2017  . History of IBS    watch diet, no meds  . Infection    UTI  . Multiple sclerosis ([place])   . Neuromuscular disorder [facility])    diagnosed with MS this visit  . Obesity   . Ovarian cyst   . Pernicious anemia 05/02/2013  . Pregnancy induced hypertension   . Preterm labor    all 3 deliveries  . SVD (spontaneous vaginal delivery)    x 3  . Urinary tract infection   . Vaginal Pap smear, abnormal     Past Surgical History:  Procedure Laterality Date  . CHOLECYSTECTOMY N/A 11/25/2017   Procedure: LAPAROSCOPIC  CHOLECYSTECTOMY WITH INTRAOPERATIVE CHOLANGIOGRAM;  Surgeon: Armandina Gemma, MD;  Location: [facility];  Service: General;  Laterality: N/A;  . COLPOSCOPY    . MYOMECTOMY N/A 04/01/2017   Procedure: Vaginal Myomectomy;  Surgeon: Osborne Oman, MD;  Location: [facility];  Service: Gynecology;  Laterality: N/A;  . TUBAL LIGATION     2012/ reversal June 2020  . WISDOM TOOTH EXTRACTION      Family History  Problem Relation Age of Onset  . Diabetes Mother   . Hypertension Mother   . Diabetes Father   . Hypertension Father   . Anesthesia problems Neg Hx   . Other Neg Hx     Social History   Tobacco Use  . Smoking status: Never Smoker  . Smokeless tobacco: Never Used  Vaping Use  . Vaping Use: Never used  Substance Use Topics  . Alcohol use: No    Alcohol/week: 0.0 standard drinks    Comment:    . Drug use: No    Comment: denies use, states was given CBD gummies for nausea    Allergies: No Known Allergies  Medications Prior to Admission  Medication Sig Dispense Refill Last Dose  . methylPREDNISolone (MEDROL DOSEPAK) 4 MG TBPK tablet See Taper instructions. 60 tablet 0 02/07/2020 at  0730  . metoCLOPramide (REGLAN) 10 MG tablet Take 1 tablet (10 mg total) by mouth 3 (three) times daily with meals. 90 tablet 0 02/07/2020 at 0730  . promethazine (PHENERGAN) 12.5 MG tablet Take 1-2 tablets (12.5-25 mg total) by mouth every 6 (six) hours as needed for nausea or vomiting. 30 tablet 2 02/05/2020  . ondansetron (ZOFRAN-ODT) 4 MG disintegrating tablet Take 1 tablet (4 mg total) by mouth every 8 (eight) hours as needed for nausea or vomiting. (Patient not taking: Reported on 02/07/2020) 30 tablet 1 Not Taking at Unknown time  . pantoprazole (PROTONIX) 20 MG tablet Take 1 tablet (20 mg total) by mouth daily. (Patient not taking: Reported on 02/07/2020) 30 tablet 1 Not Taking at Unknown time  . scopolamine (TRANSDERM-SCOP) 1 MG/3DAYS Place 1 patch (1.5 mg total) onto the skin every 3 (three) days.  (Patient not taking: Reported on 02/07/2020) 10 patch 12 Not Taking at Unknown time  . sertraline (ZOLOFT) 25 MG tablet TAKE 1 TABLET BY MOUTH EVERY DAY (Patient not taking: Reported on 02/07/2020) 90 tablet 1 Not Taking at Unknown time    Review of Systems  Constitutional: Negative for activity change and appetite change.  Gastrointestinal: Positive for constipation. Negative for abdominal distention and diarrhea.  Genitourinary: Negative for difficulty urinating, dyspareunia, flank pain, frequency and hematuria.   Physical Exam   Blood pressure 132/61, pulse 75, temperature 98.3 F (36.8 C), temperature source Oral, resp. rate 20, height 5\' 4"  (1.626 m), weight 106.4 kg, last menstrual period 11/17/2019, SpO2 99 %, unknown if currently breastfeeding.  Physical Exam Vitals and nursing note reviewed.  Constitutional:      Appearance: She is well-developed.  HENT:     Head: Normocephalic and atraumatic.  Abdominal:     General: Bowel sounds are normal.     Comments: Abdomen is soft, non distended. Mild tenderness to palpation in lower quadrants, no rebound or guarding. Palpable stool burden present.   Skin:    General: Skin is warm and dry.  Neurological:     Mental Status: She is alert.     MAU Course  Procedures  MDM Patient evaluated at bedside.  VSS  Discussed abdominal pain, spotting likely 2/2 constipation Patient stable for dc home   Assessment and Plan   Constipation -discussed increasing fluid and fiber intake -Script sent for colace BID prn  -patient stable for dc home from MAU, discussed return precautions including worsening abdominal pain or increased bleeding, etc.    Placido Sou Amandine Covino 02/07/2020, 10:29 AM

## 2020-02-10 ENCOUNTER — Other Ambulatory Visit: Payer: Self-pay

## 2020-02-10 ENCOUNTER — Inpatient Hospital Stay (HOSPITAL_COMMUNITY)
Admission: AD | Admit: 2020-02-10 | Discharge: 2020-02-10 | Disposition: A | Discharge status: Home / Self Care | Payer: Medicare HMO | Attending: Family Medicine | Admitting: Family Medicine

## 2020-02-10 ENCOUNTER — Encounter (HOSPITAL_COMMUNITY): Payer: Self-pay | Admitting: Family Medicine

## 2020-02-10 DIAGNOSIS — G35 Multiple sclerosis: Secondary | ICD-10-CM | POA: Diagnosis not present

## 2020-02-10 DIAGNOSIS — N939 Abnormal uterine and vaginal bleeding, unspecified: Secondary | ICD-10-CM

## 2020-02-10 DIAGNOSIS — O99211 Obesity complicating pregnancy, first trimester: Secondary | ICD-10-CM | POA: Diagnosis not present

## 2020-02-10 DIAGNOSIS — O99351 Diseases of the nervous system complicating pregnancy, first trimester: Secondary | ICD-10-CM | POA: Insufficient documentation

## 2020-02-10 DIAGNOSIS — Z3A12 12 weeks gestation of pregnancy: Secondary | ICD-10-CM | POA: Diagnosis not present

## 2020-02-10 DIAGNOSIS — K5900 Constipation, unspecified: Secondary | ICD-10-CM

## 2020-02-10 DIAGNOSIS — E669 Obesity, unspecified: Secondary | ICD-10-CM | POA: Diagnosis not present

## 2020-02-10 DIAGNOSIS — O26891 Other specified pregnancy related conditions, first trimester: Secondary | ICD-10-CM | POA: Diagnosis present

## 2020-02-10 DIAGNOSIS — O09521 Supervision of elderly multigravida, first trimester: Secondary | ICD-10-CM | POA: Insufficient documentation

## 2020-02-10 DIAGNOSIS — O99611 Diseases of the digestive system complicating pregnancy, first trimester: Secondary | ICD-10-CM | POA: Diagnosis not present

## 2020-02-10 DIAGNOSIS — O26851 Spotting complicating pregnancy, first trimester: Secondary | ICD-10-CM | POA: Diagnosis not present

## 2020-02-10 DIAGNOSIS — Z79899 Other long term (current) drug therapy: Secondary | ICD-10-CM | POA: Insufficient documentation

## 2020-02-10 DIAGNOSIS — O210 Mild hyperemesis gravidarum: Secondary | ICD-10-CM

## 2020-02-10 LAB — URINALYSIS, ROUTINE W REFLEX MICROSCOPIC
Bacteria, UA: NONE SEEN
Bilirubin Urine: NEGATIVE
Glucose, UA: NEGATIVE mg/dL
Ketones, ur: NEGATIVE mg/dL
Leukocytes,Ua: NEGATIVE
Nitrite: NEGATIVE
Protein, ur: NEGATIVE mg/dL
Specific Gravity, Urine: 1.026 (ref 1.005–1.030)
pH: 5 (ref 5.0–8.0)

## 2020-02-10 LAB — WET PREP, GENITAL
Clue Cells Wet Prep HPF POC: NONE SEEN
Sperm: NONE SEEN
Trich, Wet Prep: NONE SEEN
Yeast Wet Prep HPF POC: NONE SEEN

## 2020-02-10 MED ORDER — METHYLPREDNISOLONE 4 MG PO TBPK
ORAL_TABLET | ORAL | 0 refills | Status: DC
Start: 2020-02-10 — End: 2020-02-24

## 2020-02-10 MED ORDER — FAMOTIDINE IN NACL 20-0.9 MG/50ML-% IV SOLN
20.0000 mg | Freq: Once | INTRAVENOUS | Status: AC
  Administered 2020-02-10: 20 mg via INTRAVENOUS
  Filled 2020-02-10: qty 50

## 2020-02-10 MED ORDER — LACTATED RINGERS IV BOLUS
1000.0000 mL | Freq: Once | INTRAVENOUS | Status: AC
  Administered 2020-02-10: 1000 mL via INTRAVENOUS

## 2020-02-10 MED ORDER — PROCHLORPERAZINE EDISYLATE 10 MG/2ML IJ SOLN
10.0000 mg | Freq: Once | INTRAMUSCULAR | Status: AC
  Administered 2020-02-10: 10 mg via INTRAVENOUS
  Filled 2020-02-10: qty 2

## 2020-02-10 MED ORDER — PROCHLORPERAZINE MALEATE 10 MG PO TABS
10.0000 mg | ORAL_TABLET | Freq: Two times a day (BID) | ORAL | 0 refills | Status: DC | PRN
Start: 2020-02-10 — End: 2020-06-26

## 2020-02-10 MED ORDER — PROMETHAZINE HCL 25 MG/ML IJ SOLN
25.0000 mg | Freq: Once | INTRAMUSCULAR | Status: AC
  Administered 2020-02-10: 25 mg via INTRAVENOUS
  Filled 2020-02-10: qty 1

## 2020-02-10 NOTE — Discharge Instructions (Signed)
Safe Medications in Pregnancy   Acne: Benzoyl Peroxide Salicylic Acid  Backache/Headache: Tylenol: 2 regular strength every 4 hours OR              2 Extra strength every 6 hours  Colds/Coughs/Allergies: Benadryl (alcohol free) 25 mg every 6 hours as needed Breath right strips Claritin Cepacol throat lozenges Chloraseptic throat spray Cold-Eeze- up to three times per day Cough drops, alcohol free Flonase (by prescription only) Guaifenesin Mucinex Robitussin DM (plain only, alcohol free) Saline nasal spray/drops Sudafed (pseudoephedrine) & Actifed ** use only after [redacted] weeks gestation and if you do not have high blood pressure Tylenol Vicks Vaporub Zinc lozenges Zyrtec   Constipation: Colace Ducolax suppositories Fleet enema Glycerin suppositories Metamucil Milk of magnesia Miralax Senokot Smooth move tea  Diarrhea: Kaopectate Imodium A-D  *NO pepto Bismol  Hemorrhoids: Anusol Anusol HC Preparation H Tucks  Indigestion: Tums Maalox Mylanta Zantac  Pepcid  Insomnia: Benadryl (alcohol free) 25mg  every 6 hours as needed Tylenol PM Unisom, no Gelcaps  Leg Cramps: Tums MagGel  Nausea/Vomiting:  Bonine Dramamine Emetrol Ginger extract Sea bands Meclizine  Nausea medication to take during pregnancy:  Unisom (doxylamine succinate 25 mg tablets) Take one tablet daily at bedtime. If symptoms are not adequately controlled, the dose can be increased to a maximum recommended dose of two tablets daily (1/2 tablet in the morning, 1/2 tablet mid-afternoon and one at bedtime). Vitamin B6 100mg  tablets. Take one tablet twice a day (up to 200 mg per day).  Skin Rashes: Aveeno products Benadryl cream or 25mg  every 6 hours as needed Calamine Lotion 1% cortisone cream  Yeast infection: Gyne-lotrimin 7 Monistat 7   **If taking multiple medications, please check labels to avoid duplicating the same active ingredients **take medication as directed on  the label ** Do not exceed 4000 mg of tylenol in 24 hours **Do not take medications that contain aspirin or ibuprofen    Constipation/Bowel Prep  You have constipation which is hard stools that are difficult to pass. It is important to have regular bowel movements every 1-3 days that are soft and easy to pass. Hard stools increase your risk of hemorrhoids and are very uncomfortable.   To prevent constipation you can increase the amount of fiber in your diet. Examples of foods with fiber are leafy greens, whole grain breads, oatmeal and other grains.  It is also important to drink at least eight 8oz glass of water everyday.   If you have not has a bowel movement in 4-5 days you made need to clean out your bowel.  This will have establish normal movement through your bowel.    Miralax Clean out  Take 8 capfuls of miralax in 64 oz of gatorade. You can use any fluid that appeals to you (gatorade, water, juice)  Continue to drink at least eight 8 oz glasses of water throughout the day  You can repeat with another 8 capfuls of miralax in 64 oz of gatorade if you are not having a large amount of stools  You will need to be at home and close to a bathroom for about 8 hours when you do the above as you may need to go to the bathroom frequently.   After you are cleaned out: - Start Colace100mg  twice daily - Start Miralax once daily - Start a daily fiber supplement like metamucil or citrucel - You can safely use enemas in pregnancy  - if you are having diarrhea you can reduce to Colace once  a day or miralax every other day or a 1/2 capful daily.

## 2020-02-10 NOTE — MAU Provider Note (Signed)
History     CSN: [id number]  Arrival date and time: 02/10/20 1001   First Provider Initiated Contact with Patient 02/10/20 1041      Chief Complaint  Patient presents with  . Abdominal Pain  . Vaginal Bleeding  . Emesis  . Nausea   HPI [name] is a 34 y.o. [id number] at [redacted]w[redacted]d who presents with nausea and vomiting. This is her 6th visit in the last month for the same complaint. After her last visit, she was prescribed a steroid taper that she reports has been helping. She states now that she is nearing the end of the taper, the vomiting is getting worse again. She is requesting to be on steroids again. She states she has thrown up more than 10 times today. She has been prescribed multiple medications for nausea and vomiting but states she doesn't take them because they don't work. She also is not taking her colace because it didn't help when she took the first dose. She still has not had a bowel movement. She also reports spotting when she wipes.   OB History    Gravida  7   Para  3   Term  0   Preterm  3   AB  3   Living  4     SAB      TAB  3   Ectopic      Multiple  1   Live Births  4        Obstetric Comments  Induced early due to Pre- E with all prenancies        Past Medical History:  Diagnosis Date  . Abnormal Pap smear    Colpo>normal  . Fibroid   . Gonorrhea   . Headache    last migraine 01/2017  . History of IBS    watch diet, no meds  . Infection    UTI  . Multiple sclerosis ([place])   . Neuromuscular disorder [facility])    diagnosed with MS this visit  . Obesity   . Ovarian cyst   . Pernicious anemia 05/02/2013  . Pregnancy induced hypertension   . Preterm labor    all 3 deliveries  . SVD (spontaneous vaginal delivery)    x 3  . Urinary tract infection   . Vaginal Pap smear, abnormal     Past Surgical History:  Procedure Laterality Date  . CHOLECYSTECTOMY N/A 11/25/2017   Procedure: LAPAROSCOPIC CHOLECYSTECTOMY WITH  INTRAOPERATIVE CHOLANGIOGRAM;  Surgeon: Armandina Gemma, MD;  Location: [facility];  Service: General;  Laterality: N/A;  . COLPOSCOPY    . MYOMECTOMY N/A 04/01/2017   Procedure: Vaginal Myomectomy;  Surgeon: Osborne Oman, MD;  Location: [facility];  Service: Gynecology;  Laterality: N/A;  . TUBAL LIGATION     2012/ reversal June 2020  . WISDOM TOOTH EXTRACTION      Family History  Problem Relation Age of Onset  . Diabetes Mother   . Hypertension Mother   . Diabetes Father   . Hypertension Father   . Anesthesia problems Neg Hx   . Other Neg Hx     Social History   Tobacco Use  . Smoking status: Never Smoker  . Smokeless tobacco: Never Used  Vaping Use  . Vaping Use: Never used  Substance Use Topics  . Alcohol use: No    Alcohol/week: 0.0 standard drinks    Comment:    . Drug use: No    Comment: denies use, states was given  CBD gummies for nausea    Allergies: No Known Allergies  Medications Prior to Admission  Medication Sig Dispense Refill Last Dose  . docusate sodium (COLACE) 100 MG capsule Take 1 capsule (100 mg total) by mouth 2 (two) times daily as needed for mild constipation. 30 capsule 2 02/09/2020 at Unknown time  . methylPREDNISolone (MEDROL DOSEPAK) 4 MG TBPK tablet See Taper instructions. 60 tablet 0 02/09/2020 at Unknown time  . metoCLOPramide (REGLAN) 10 MG tablet Take 1 tablet (10 mg total) by mouth 3 (three) times daily with meals. 90 tablet 0 02/09/2020 at 1800  . ondansetron (ZOFRAN-ODT) 4 MG disintegrating tablet Take 1 tablet (4 mg total) by mouth every 8 (eight) hours as needed for nausea or vomiting. 30 tablet 1 02/09/2020 at 1800  . promethazine (PHENERGAN) 12.5 MG tablet Take 1-2 tablets (12.5-25 mg total) by mouth every 6 (six) hours as needed for nausea or vomiting. 30 tablet 2 Past Week at Unknown time  . pantoprazole (PROTONIX) 20 MG tablet Take 1 tablet (20 mg total) by mouth daily. (Patient not taking: Reported on 02/07/2020) 30 tablet 1   . scopolamine  (TRANSDERM-SCOP) 1 MG/3DAYS Place 1 patch (1.5 mg total) onto the skin every 3 (three) days. (Patient not taking: Reported on 02/07/2020) 10 patch 12   . sertraline (ZOLOFT) 25 MG tablet TAKE 1 TABLET BY MOUTH EVERY DAY (Patient not taking: Reported on 02/07/2020) 90 tablet 1     Review of Systems  Constitutional: Negative.  Negative for fatigue and fever.  HENT: Negative.   Respiratory: Negative.  Negative for shortness of breath.   Cardiovascular: Negative.  Negative for chest pain.  Gastrointestinal: Positive for abdominal pain, constipation, nausea and vomiting. Negative for diarrhea.  Genitourinary: Positive for vaginal bleeding. Negative for dysuria.  Neurological: Negative.  Negative for dizziness and headaches.   Physical Exam   Blood pressure 121/63, pulse 83, temperature 98.8 F (37.1 C), temperature source Oral, resp. rate 18, height 5\' 4"  (1.626 m), weight 104.1 kg, last menstrual period 11/17/2019, SpO2 99 %, unknown if currently breastfeeding.  Physical Exam Vitals and nursing note reviewed.  Constitutional:      General: She is not in acute distress.    Appearance: She is well-developed.  HENT:     Head: Normocephalic.  Eyes:     Pupils: Pupils are equal, round, and reactive to light.  Cardiovascular:     Rate and Rhythm: Normal rate and regular rhythm.     Heart sounds: Normal heart sounds.  Pulmonary:     Effort: Pulmonary effort is normal. No respiratory distress.     Breath sounds: Normal breath sounds.  Abdominal:     General: Bowel sounds are normal. There is no distension.     Palpations: Abdomen is soft.     Tenderness: There is no abdominal tenderness.  Genitourinary:    Comments: Cervical friability noted, no blood in vault Cervix closed/thick/posterior Skin:    General: Skin is warm and dry.  Neurological:     Mental Status: She is alert and oriented to person, place, and time.  Psychiatric:        Behavior: Behavior normal.        Thought  Content: Thought content normal.        Judgment: Judgment normal.     MAU Course  Procedures Results for orders placed or performed during the hospital encounter of 02/10/20 (from the past 24 hour(s))  Urinalysis, Routine w reflex microscopic Urine, Clean Catch  Status: Abnormal   Collection Time: 02/10/20 10:50 AM  Result Value Ref Range   Color, Urine AMBER (A) YELLOW   APPearance CLEAR CLEAR   Specific Gravity, Urine 1.026 1.005 - 1.030   pH 5.0 5.0 - 8.0   Glucose, UA NEGATIVE NEGATIVE mg/dL   Hgb urine dipstick MODERATE (A) NEGATIVE   Bilirubin Urine NEGATIVE NEGATIVE   Ketones, ur NEGATIVE NEGATIVE mg/dL   Protein, ur NEGATIVE NEGATIVE mg/dL   Nitrite NEGATIVE NEGATIVE   Leukocytes,Ua NEGATIVE NEGATIVE   RBC / HPF 11-20 0 - 5 RBC/hpf   WBC, UA 0-5 0 - 5 WBC/hpf   Bacteria, UA NONE SEEN NONE SEEN   Squamous Epithelial / LPF 0-5 0 - 5   Mucus PRESENT   Wet prep, genital     Status: Abnormal   Collection Time: 02/10/20 11:13 AM   Specimen: PATH Cytology Cervicovaginal Ancillary Only  Result Value Ref Range   Yeast Wet Prep HPF POC NONE SEEN NONE SEEN   Trich, Wet Prep NONE SEEN NONE SEEN   Clue Cells Wet Prep HPF POC NONE SEEN NONE SEEN   WBC, Wet Prep HPF POC MODERATE (A) NONE SEEN   Sperm NONE SEEN    MDM  Lengthy review of home medications with patient Scopalamine- not taking Reglan- last dose 8/27 at 1200 Zofran- last dose 8/27 at 1700 Protonix- not taking x2 days Phenergan- not taking  UA Soap Suds Enema- good relief LR bolus Phenergan IV Pepcid IV Compazine IV  Consulted with Dr. Kennon Rounds- ok to re-prescribe steroid taper  Discussed with patient importance of continuing to take prescribed medication for nausea and vomiting to help suppress symptoms even if she feels like they aren't working. Encouraged patient to use SeaBands and ginger pops as a multimodal approach to management. Patient verbalized understanding.  Assessment and Plan   1.  Hyperemesis gravidarum   2. [redacted] weeks gestation of pregnancy   3. Vaginal spotting   4. Constipation during pregnancy in first trimester    -Discharge home in stable condition -Rx for compazine and steroid taper sent to patient's pharmacy -Nausea and vomiting precautions discussed -Patient advised to follow-up with OB as scheduled for prenatal care -Patient may return to MAU as needed or if her condition were to change or worsen   Wende Mott CNM 02/10/2020, 10:41 AM

## 2020-02-10 NOTE — MAU Note (Signed)
[name] is a 34 y.o. at [redacted]w[redacted]d here in MAU reporting: excessive vomiting. Emesis > 10 in the past 24 hours. States she is still bleeding and abdominal pain.   Onset of complaint: ongoing  Pain score: 8/10  Vitals:   02/10/20 1015  BP: 121/63  Pulse: 83  Resp: 18  Temp: 98.8 F (37.1 C)  SpO2: 99%     Lab orders placed from triage: UA

## 2020-02-12 LAB — GC/CHLAMYDIA PROBE AMP (~~LOC~~) NOT AT ARMC
Chlamydia: NEGATIVE
Comment: NEGATIVE
Comment: NORMAL
Neisseria Gonorrhea: NEGATIVE

## 2020-02-20 ENCOUNTER — Other Ambulatory Visit: Payer: Self-pay

## 2020-02-20 ENCOUNTER — Ambulatory Visit (INDEPENDENT_AMBULATORY_CARE_PROVIDER_SITE_OTHER): Payer: Medicare HMO

## 2020-02-20 ENCOUNTER — Inpatient Hospital Stay (HOSPITAL_COMMUNITY)
Admission: AD | Admit: 2020-02-20 | Discharge: 2020-02-20 | Disposition: A | Discharge status: Home / Self Care | Payer: Medicare HMO | Attending: Obstetrics and Gynecology | Admitting: Obstetrics and Gynecology

## 2020-02-20 DIAGNOSIS — O99351 Diseases of the nervous system complicating pregnancy, first trimester: Secondary | ICD-10-CM | POA: Insufficient documentation

## 2020-02-20 DIAGNOSIS — O210 Mild hyperemesis gravidarum: Secondary | ICD-10-CM

## 2020-02-20 DIAGNOSIS — O99211 Obesity complicating pregnancy, first trimester: Secondary | ICD-10-CM | POA: Insufficient documentation

## 2020-02-20 DIAGNOSIS — E669 Obesity, unspecified: Secondary | ICD-10-CM | POA: Insufficient documentation

## 2020-02-20 DIAGNOSIS — Z87440 Personal history of urinary (tract) infections: Secondary | ICD-10-CM | POA: Diagnosis not present

## 2020-02-20 DIAGNOSIS — Z3A13 13 weeks gestation of pregnancy: Secondary | ICD-10-CM | POA: Insufficient documentation

## 2020-02-20 DIAGNOSIS — E860 Dehydration: Secondary | ICD-10-CM | POA: Diagnosis not present

## 2020-02-20 DIAGNOSIS — K589 Irritable bowel syndrome without diarrhea: Secondary | ICD-10-CM | POA: Diagnosis not present

## 2020-02-20 DIAGNOSIS — Z9049 Acquired absence of other specified parts of digestive tract: Secondary | ICD-10-CM | POA: Insufficient documentation

## 2020-02-20 DIAGNOSIS — Z79899 Other long term (current) drug therapy: Secondary | ICD-10-CM | POA: Insufficient documentation

## 2020-02-20 DIAGNOSIS — O0990 Supervision of high risk pregnancy, unspecified, unspecified trimester: Secondary | ICD-10-CM | POA: Insufficient documentation

## 2020-02-20 DIAGNOSIS — G35 Multiple sclerosis: Secondary | ICD-10-CM | POA: Insufficient documentation

## 2020-02-20 DIAGNOSIS — O09521 Supervision of elderly multigravida, first trimester: Secondary | ICD-10-CM | POA: Diagnosis not present

## 2020-02-20 DIAGNOSIS — O99611 Diseases of the digestive system complicating pregnancy, first trimester: Secondary | ICD-10-CM | POA: Insufficient documentation

## 2020-02-20 DIAGNOSIS — Z3480 Encounter for supervision of other normal pregnancy, unspecified trimester: Secondary | ICD-10-CM

## 2020-02-20 DIAGNOSIS — O211 Hyperemesis gravidarum with metabolic disturbance: Secondary | ICD-10-CM

## 2020-02-20 DIAGNOSIS — Z7952 Long term (current) use of systemic steroids: Secondary | ICD-10-CM | POA: Diagnosis not present

## 2020-02-20 DIAGNOSIS — O99281 Endocrine, nutritional and metabolic diseases complicating pregnancy, first trimester: Secondary | ICD-10-CM | POA: Insufficient documentation

## 2020-02-20 LAB — URINALYSIS, ROUTINE W REFLEX MICROSCOPIC
Bilirubin Urine: NEGATIVE
Glucose, UA: NEGATIVE mg/dL
Ketones, ur: NEGATIVE mg/dL
Leukocytes,Ua: NEGATIVE
Nitrite: NEGATIVE
Protein, ur: NEGATIVE mg/dL
Specific Gravity, Urine: 1.019 (ref 1.005–1.030)
pH: 5 (ref 5.0–8.0)

## 2020-02-20 LAB — COMPREHENSIVE METABOLIC PANEL
ALT: 20 U/L (ref 0–44)
AST: 14 U/L — ABNORMAL LOW (ref 15–41)
Albumin: 3.3 g/dL — ABNORMAL LOW (ref 3.5–5.0)
Alkaline Phosphatase: 34 U/L — ABNORMAL LOW (ref 38–126)
Anion gap: 11 (ref 5–15)
BUN: 10 mg/dL (ref 6–20)
CO2: 24 mmol/L (ref 22–32)
Calcium: 9.2 mg/dL (ref 8.9–10.3)
Chloride: 99 mmol/L (ref 98–111)
Creatinine, Ser: 0.67 mg/dL (ref 0.44–1.00)
GFR calc Af Amer: >60 mL/min (ref >60)
GFR calc non Af Amer: >60 mL/min (ref >60)
Glucose, Bld: 83 mg/dL (ref 70–99)
Potassium: 3.5 mmol/L (ref 3.5–5.1)
Sodium: 134 mmol/L — ABNORMAL LOW (ref 135–145)
Total Bilirubin: 0.8 mg/dL (ref 0.3–1.2)
Total Protein: 6.5 g/dL (ref 6.5–8.1)

## 2020-02-20 MED ORDER — SCOPOLAMINE 1 MG/3DAYS TD PT72
1.0000 | MEDICATED_PATCH | Freq: Once | TRANSDERMAL | Status: DC
  Administered 2020-02-20: 1.5 mg via TRANSDERMAL
  Filled 2020-02-20: qty 1

## 2020-02-20 MED ORDER — PROMETHAZINE HCL 25 MG/ML IJ SOLN
12.5000 mg | Freq: Once | INTRAMUSCULAR | Status: AC
  Administered 2020-02-20: 12.5 mg via INTRAVENOUS
  Filled 2020-02-20: qty 1

## 2020-02-20 MED ORDER — PYRIDOXINE HCL 100 MG/ML IJ SOLN
100.0000 mg | Freq: Once | INTRAMUSCULAR | Status: AC
  Administered 2020-02-20: 100 mg via INTRAVENOUS
  Filled 2020-02-20: qty 1

## 2020-02-20 MED ORDER — THIAMINE HCL 100 MG/ML IJ SOLN
100.0000 mg | Freq: Once | INTRAMUSCULAR | Status: AC
  Administered 2020-02-20: 100 mg via INTRAVENOUS
  Filled 2020-02-20: qty 1

## 2020-02-20 MED ORDER — BLOOD PRESSURE KIT DEVI: 1.0000 | 0 refills | Status: DC

## 2020-02-20 MED ORDER — LACTATED RINGERS IV SOLN: Freq: Once | INTRAVENOUS | Status: AC

## 2020-02-20 NOTE — MAU Note (Signed)
Pt reports cramping in lower abd x 2 days, vomiting x 2 days.

## 2020-02-20 NOTE — Patient Instructions (Signed)
Meet the Provider Zoom Sessions for [facility]  We are offering Virtual sessions for current, new and prospective patients to learn about our practice, model of care and services and answer questions about birth during Covid. No registration required.   2021 Dates August 24th at 6:00 pm September 16th at 6:00 pm October 21st at 6:00 pm November 18th at 6:00 pm December 16th at 6:00 pm   [email or web address]

## 2020-02-20 NOTE — Progress Notes (Signed)
I connected with  Allene Dillon on 02/20/20 at home/ Nurse at [facility] by a video enabled telemedicine application and verified that I am speaking with the correct person using two identifiers.   I discussed the limitations of evaluation and management by telemedicine. The patient expressed understanding and agreed to proceed.  PRENATAL INTAKE SUMMARY  Ms. Rochford presents today New OB Nurse Interview.  OB History    Gravida  7   Para  3   Term  0   Preterm  3   AB  3   Living  4     SAB      TAB  3   Ectopic      Multiple  1   Live Births  4        Obstetric Comments  Induced early due to Pre- E with all prenancies       I have reviewed the patient's medical, obstetrical, social, and family histories, medications, and available lab results.  SUBJECTIVE She has no unusual complaints and complains of nausea with vomiting for 14  Days. Rx already prescribed.  OBJECTIVE Initial NURSE INTAKE (New OB)  GENERAL APPEARANCE: oriented to person, place and time   ASSESSMENT Normal pregnancy EDD 08/23/2020 PHQ-9=4 Pregnancy Risk Screening done  PLAN Prenatal care OB Pnl will be done at NOB visit BP Cuff ordered

## 2020-02-20 NOTE — MAU Provider Note (Signed)
Chief Complaint: Abdominal Pain and Emesis   First Provider Initiated Contact with Patient 02/20/20 0341         SUBJECTIVE HPI: [name] is a 34 y.o. [id number] at [redacted]w[redacted]d by LMP who presents to maternity admissions reporting uncontrollable vomiting with abdominal cramping for two days.  Has had multiple visits for Hyperemesis throughout the pregnancy.  Is on multiple meds, and her second round of steroids.  She denies vaginal bleeding, vaginal itching/burning, urinary symptoms, h/a, dizziness, or fever/chills.    Emesis  This is a recurrent problem. The current episode started more than 1 month ago. The problem occurs intermittently. The problem has been unchanged. There has been no fever. Associated symptoms include abdominal pain (mild cramps). Pertinent negatives include no chills, diarrhea, dizziness, fever, headaches or myalgias. Treatments tried: several antiemetic drugs. The treatment provided no relief.   RN Note: Pt reports cramping in lower abd x 2 days, vomiting x 2 days  Past Medical History:  Diagnosis Date  . Abnormal Pap smear    Colpo>normal  . Fibroid   . Gonorrhea   . Headache    last migraine 01/2017  . History of IBS    watch diet, no meds  . Infection    UTI  . Multiple sclerosis ([place])   . Neuromuscular disorder [facility])    diagnosed with MS this visit  . Obesity   . Ovarian cyst   . Pernicious anemia 05/02/2013  . Pregnancy induced hypertension   . Preterm labor    all 3 deliveries  . SVD (spontaneous vaginal delivery)    x 3  . Urinary tract infection   . Vaginal Pap smear, abnormal    Past Surgical History:  Procedure Laterality Date  . CHOLECYSTECTOMY N/A 11/25/2017   Procedure: LAPAROSCOPIC CHOLECYSTECTOMY WITH INTRAOPERATIVE CHOLANGIOGRAM;  Surgeon: Armandina Gemma, MD;  Location: [facility];  Service: General;  Laterality: N/A;  . COLPOSCOPY    . MYOMECTOMY N/A 04/01/2017   Procedure: Vaginal Myomectomy;  Surgeon: Osborne Oman, MD;  Location: [place]  ORS;  Service: Gynecology;  Laterality: N/A;  . TUBAL LIGATION     2012/ reversal June 2020  . WISDOM TOOTH EXTRACTION     Social History   Socioeconomic History  . Marital status: Single    Spouse name: Not on file  . Number of children: 4  . Years of education: 61  . Highest education level: Not on file  Occupational History    Employer: [employer]    Comment: [employer]  Tobacco Use  . Smoking status: Never Smoker  . Smokeless tobacco: Never Used  Vaping Use  . Vaping Use: Never used  Substance and Sexual Activity  . Alcohol use: No    Alcohol/week: 0.0 standard drinks    Comment:    . Drug use: No    Comment: denies use, states was given CBD gummies for nausea  . Sexual activity: Yes    Partners: Male  Other Topics Concern  . Not on file  Social History Narrative   Lives with 4 children. Good support group in her community including mother who lives close.    Social Determinants of Health   Financial Resource Strain:   . Difficulty of Paying Living Expenses: Not on file  Food Insecurity:   . Worried About Charity fundraiser in the Last Year: Not on file  . Ran Out of Food in the Last Year: Not on file  Transportation Needs:   .  Lack of Transportation (Medical): Not on file  . Lack of Transportation (Non-Medical): Not on file  Physical Activity:   . Days of Exercise per Week: Not on file  . Minutes of Exercise per Session: Not on file  Stress:   . Feeling of Stress : Not on file  Social Connections:   . Frequency of Communication with Friends and Family: Not on file  . Frequency of Social Gatherings with Friends and Family: Not on file  . Attends Religious Services: Not on file  . Active Member of Clubs or Organizations: Not on file  . Attends Archivist Meetings: Not on file  . Marital Status: Not on file  Intimate Partner Violence:   . Fear of Current or Ex-Partner: Not on file  . Emotionally Abused: Not on file  .  Physically Abused: Not on file  . Sexually Abused: Not on file   No current facility-administered medications on file prior to encounter.   Current Outpatient Medications on File Prior to Encounter  Medication Sig Dispense Refill  . docusate sodium (COLACE) 100 MG capsule Take 1 capsule (100 mg total) by mouth 2 (two) times daily as needed for mild constipation. 30 capsule 2  . methylPREDNISolone (MEDROL DOSEPAK) 4 MG TBPK tablet See Taper instructions. 60 tablet 0  . metoCLOPramide (REGLAN) 10 MG tablet Take 1 tablet (10 mg total) by mouth 3 (three) times daily with meals. 90 tablet 0  . ondansetron (ZOFRAN-ODT) 4 MG disintegrating tablet Take 1 tablet (4 mg total) by mouth every 8 (eight) hours as needed for nausea or vomiting. 30 tablet 1  . pantoprazole (PROTONIX) 20 MG tablet Take 1 tablet (20 mg total) by mouth daily. (Patient not taking: Reported on 02/07/2020) 30 tablet 1  . prochlorperazine (COMPAZINE) 10 MG tablet Take 1 tablet (10 mg total) by mouth 2 (two) times daily as needed for nausea or vomiting. 30 tablet 0  . promethazine (PHENERGAN) 12.5 MG tablet Take 1-2 tablets (12.5-25 mg total) by mouth every 6 (six) hours as needed for nausea or vomiting. 30 tablet 2  . scopolamine (TRANSDERM-SCOP) 1 MG/3DAYS Place 1 patch (1.5 mg total) onto the skin every 3 (three) days. (Patient not taking: Reported on 02/07/2020) 10 patch 12  . sertraline (ZOLOFT) 25 MG tablet TAKE 1 TABLET BY MOUTH EVERY DAY (Patient not taking: Reported on 02/07/2020) 90 tablet 1   No Known Allergies  I have reviewed patient's Past Medical Hx, Surgical Hx, Family Hx, Social Hx, medications and allergies.   ROS:  Review of Systems  Constitutional: Negative for chills and fever.  Gastrointestinal: Positive for abdominal pain (mild cramps) and vomiting. Negative for diarrhea.  Musculoskeletal: Negative for myalgias.  Neurological: Negative for dizziness and headaches.   Review of Systems  Other systems  negative   Physical Exam  Physical Exam Patient Vitals for the past 24 hrs:  BP Temp Temp src Pulse Resp SpO2 Height Weight  02/20/20 0321 122/62 99 F (37.2 C) Oral 76 17 99 % 5\' 4"  (1.626 m) 107 kg   Constitutional: Well-developed, well-nourished female in no acute distress.  Cardiovascular: normal rate Respiratory: normal effort GI: Abd soft, non-tender. Pos BS x 4 MS: Extremities nontender, no edema, normal ROM Neurologic: Alert and oriented x 4.  GU: Neg CVAT.  PELVIC EXAM: deferred   LAB RESULTS Results for orders placed or performed during the hospital encounter of 02/20/20 (from the past 24 hour(s))  Urinalysis, Routine w reflex microscopic     Status:  Abnormal   Collection Time: 02/20/20  3:45 AM  Result Value Ref Range   Color, Urine YELLOW YELLOW   APPearance CLEAR CLEAR   Specific Gravity, Urine 1.019 1.005 - 1.030   pH 5.0 5.0 - 8.0   Glucose, UA NEGATIVE NEGATIVE mg/dL   Hgb urine dipstick MODERATE (A) NEGATIVE   Bilirubin Urine NEGATIVE NEGATIVE   Ketones, ur NEGATIVE NEGATIVE mg/dL   Protein, ur NEGATIVE NEGATIVE mg/dL   Nitrite NEGATIVE NEGATIVE   Leukocytes,Ua NEGATIVE NEGATIVE   RBC / HPF 11-20 0 - 5 RBC/hpf   WBC, UA 0-5 0 - 5 WBC/hpf   Bacteria, UA RARE (A) NONE SEEN   Squamous Epithelial / LPF 0-5 0 - 5   Mucus PRESENT   Comprehensive metabolic panel     Status: Abnormal   Collection Time: 02/20/20  5:20 AM  Result Value Ref Range   Sodium 134 (L) 135 - 145 mmol/L   Potassium 3.5 3.5 - 5.1 mmol/L   Chloride 99 98 - 111 mmol/L   CO2 24 22 - 32 mmol/L   Glucose, Bld 83 70 - 99 mg/dL   BUN 10 6 - 20 mg/dL   Creatinine, Ser 0.67 0.44 - 1.00 mg/dL   Calcium 9.2 8.9 - 10.3 mg/dL   Total Protein 6.5 6.5 - 8.1 g/dL   Albumin 3.3 (L) 3.5 - 5.0 g/dL   AST 14 (L) 15 - 41 U/L   ALT 20 0 - 44 U/L   Alkaline Phosphatase 34 (L) 38 - 126 U/L   Total Bilirubin 0.8 0.3 - 1.2 mg/dL   GFR calc non Af Amer >60 >60 mL/min   GFR calc Af Amer >60 >60 mL/min    Anion gap 11 5 - 15   --/--/A POS (07/07 1528)  IMAGING   MAU Management/MDM: Ordered IV hydration with promethazine for nausea Added vitamin B6, thiamine, and a new scopolamine patch After this, patient was able to tolerate PO intake and wanted to go home Discussed using [facility] to get hydrated twice a week, message sent to office   ASSESSMENT Single IUP at [redacted]w[redacted]d Hyperemesis Dehydration  PLAN Discharge home  Continue meds at home Messaged office to arrange twice weekly IV/Phenergan infusions at [facility]  Pt stable at time of discharge. Encouraged to return here or to other Urgent Care/ED if she develops worsening of symptoms, increase in pain, fever, or other concerning symptoms.    Hansel Feinstein CNM, MSN Certified Nurse-Midwife 02/20/2020  3:41 AM

## 2020-02-20 NOTE — Discharge Instructions (Signed)
Hyperemesis Gravidarum Hyperemesis gravidarum is a severe form of nausea and vomiting that happens during pregnancy. Hyperemesis is worse than morning sickness. It may cause you to have nausea or vomiting all day for many days. It may keep you from eating and drinking enough food and liquids, which can lead to dehydration, malnutrition, and weight loss. Hyperemesis usually occurs during the first half (the first 20 weeks) of pregnancy. It often goes away once a woman is in her second half of pregnancy. However, sometimes hyperemesis continues through an entire pregnancy. What are the causes? The cause of this condition is not known. It may be related to changes in chemicals (hormones) in the body during pregnancy, such as the high level of pregnancy hormone (human chorionic gonadotropin) or the increase in the female sex hormone (estrogen). What are the signs or symptoms? Symptoms of this condition include:  Nausea that does not go away.  Vomiting that does not allow you to keep any food down.  Weight loss.  Body fluid loss (dehydration).  Having no desire to eat, or not liking food that you have previously enjoyed. How is this diagnosed? This condition may be diagnosed based on:  A physical exam.  Your medical history.  Your symptoms.  Blood tests.  Urine tests. How is this treated? This condition is managed by controlling symptoms. This may include:  Following an eating plan. This can help lessen nausea and vomiting.  Taking prescription medicines. An eating plan and medicines are often used together to help control symptoms. If medicines do not help relieve nausea and vomiting, you may need to receive fluids through an IV at the hospital. Follow these instructions at home: Eating and drinking   Avoid the following: ? Drinking fluids with meals. Try not to drink anything during the 30 minutes before and after your meals. ? Drinking more than 1 cup of fluid at a  time. ? Eating foods that trigger your symptoms. These may include spicy foods, coffee, high-fat foods, very sweet foods, and acidic foods. ? Skipping meals. Nausea can be more intense on an empty stomach. If you cannot tolerate food, do not force it. Try sucking on ice chips or other frozen items and make up for missed calories later. ? Lying down within 2 hours after eating. ? Being exposed to environmental triggers. These may include food smells, smoky rooms, closed spaces, rooms with strong smells, warm or humid places, overly loud and noisy rooms, and rooms with motion or flickering lights. Try eating meals in a well-ventilated area that is free of strong smells. ? Quick and sudden changes in your movement. ? Taking iron pills and multivitamins that contain iron. If you take prescription iron pills, do not stop taking them unless your health care provider approves. ? Preparing food. The smell of food can spoil your appetite or trigger nausea.  To help relieve your symptoms: ? Listen to your body. Everyone is different and has different preferences. Find what works best for you. ? Eat and drink slowly. ? Eat 5-6 small meals daily instead of 3 large meals. Eating small meals and snacks can help you avoid an empty stomach. ? In the morning, before getting out of bed, eat a couple of crackers to avoid moving around on an empty stomach. ? Try eating starchy foods as these are usually tolerated well. Examples include cereal, toast, bread, potatoes, pasta, rice, and pretzels. ? Include at least 1 serving of protein with your meals and snacks. Protein options include   lean meats, poultry, seafood, beans, nuts, nut butters, eggs, cheese, and yogurt. ? Try eating a protein-rich snack before bed. Examples of a protein-rick snack include cheese and crackers or a peanut butter sandwich made with 1 slice of whole-wheat bread and 1 tsp (5 g) of peanut butter. ? Eat or suck on things that have ginger in them.  It may help relieve nausea. Add  tsp ground ginger to hot tea or choose ginger tea. ? Try drinking 100% fruit juice or an electrolyte drink. An electrolyte drink contains sodium, potassium, and chloride. ? Drink fluids that are cold, clear, and carbonated or sour. Examples include lemonade, ginger ale, lemon-lime soda, ice water, and sparkling water. ? Brush your teeth or use a mouth rinse after meals. ? Talk with your health care provider about starting a supplement of vitamin B6. General instructions  Take over-the-counter and prescription medicines only as told by your health care provider.  Follow instructions from your health care provider about eating or drinking restrictions.  Continue to take your prenatal vitamins as told by your health care provider. If you are having trouble taking your prenatal vitamins, talk with your health care provider about different options.  Keep all follow-up and pre-birth (prenatal) visits as told by your health care provider. This is important. Contact a health care provider if:  You have pain in your abdomen.  You have a severe headache.  You have vision problems.  You are losing weight.  You feel weak or dizzy. Get help right away if:  You cannot drink fluids without vomiting.  You vomit blood.  You have constant nausea and vomiting.  You are very weak.  You faint.  You have a fever and your symptoms suddenly get worse. Summary  Hyperemesis gravidarum is a severe form of nausea and vomiting that happens during pregnancy.  Making some changes to your eating habits may help relieve nausea and vomiting.  This condition may be managed with medicine.  If medicines do not help relieve nausea and vomiting, you may need to receive fluids through an IV at the hospital. This information is not intended to replace advice given to you by your health care provider. Make sure you discuss any questions you have with your health care  provider. Document Revised: 06/21/2017 Document Reviewed: 01/29/2016 Elsevier Patient Education  2020 [employer]  

## 2020-02-24 ENCOUNTER — Inpatient Hospital Stay (HOSPITAL_COMMUNITY)
Admission: AD | Admit: 2020-02-24 | Discharge: 2020-02-24 | Disposition: A | Discharge status: Home / Self Care | Payer: Medicare HMO | Attending: Obstetrics & Gynecology | Admitting: Obstetrics & Gynecology

## 2020-02-24 ENCOUNTER — Other Ambulatory Visit: Payer: Self-pay

## 2020-02-24 ENCOUNTER — Encounter (HOSPITAL_COMMUNITY): Payer: Self-pay | Admitting: Obstetrics & Gynecology

## 2020-02-24 DIAGNOSIS — G35 Multiple sclerosis: Secondary | ICD-10-CM | POA: Diagnosis not present

## 2020-02-24 DIAGNOSIS — N898 Other specified noninflammatory disorders of vagina: Secondary | ICD-10-CM | POA: Diagnosis not present

## 2020-02-24 DIAGNOSIS — Z3480 Encounter for supervision of other normal pregnancy, unspecified trimester: Secondary | ICD-10-CM

## 2020-02-24 DIAGNOSIS — Z3A14 14 weeks gestation of pregnancy: Secondary | ICD-10-CM | POA: Diagnosis not present

## 2020-02-24 DIAGNOSIS — O99212 Obesity complicating pregnancy, second trimester: Secondary | ICD-10-CM | POA: Diagnosis not present

## 2020-02-24 DIAGNOSIS — O99352 Diseases of the nervous system complicating pregnancy, second trimester: Secondary | ICD-10-CM | POA: Insufficient documentation

## 2020-02-24 DIAGNOSIS — Z7952 Long term (current) use of systemic steroids: Secondary | ICD-10-CM | POA: Insufficient documentation

## 2020-02-24 DIAGNOSIS — O210 Mild hyperemesis gravidarum: Secondary | ICD-10-CM | POA: Insufficient documentation

## 2020-02-24 DIAGNOSIS — R109 Unspecified abdominal pain: Secondary | ICD-10-CM | POA: Diagnosis not present

## 2020-02-24 DIAGNOSIS — Z79899 Other long term (current) drug therapy: Secondary | ICD-10-CM | POA: Diagnosis not present

## 2020-02-24 DIAGNOSIS — O26892 Other specified pregnancy related conditions, second trimester: Secondary | ICD-10-CM | POA: Diagnosis not present

## 2020-02-24 LAB — COMPREHENSIVE METABOLIC PANEL
ALT: 36 U/L (ref 0–44)
AST: 37 U/L (ref 15–41)
Albumin: 3.3 g/dL — ABNORMAL LOW (ref 3.5–5.0)
Alkaline Phosphatase: 54 U/L (ref 38–126)
Anion gap: 13 (ref 5–15)
BUN: 6 mg/dL (ref 6–20)
CO2: 22 mmol/L (ref 22–32)
Calcium: 9.5 mg/dL (ref 8.9–10.3)
Chloride: 97 mmol/L — ABNORMAL LOW (ref 98–111)
Creatinine, Ser: 0.74 mg/dL (ref 0.44–1.00)
GFR calc Af Amer: >60 mL/min (ref >60)
GFR calc non Af Amer: >60 mL/min (ref >60)
Glucose, Bld: 80 mg/dL (ref 70–99)
Potassium: 3.9 mmol/L (ref 3.5–5.1)
Sodium: 132 mmol/L — ABNORMAL LOW (ref 135–145)
Total Bilirubin: 1 mg/dL (ref 0.3–1.2)
Total Protein: 7.2 g/dL (ref 6.5–8.1)

## 2020-02-24 LAB — URINALYSIS, ROUTINE W REFLEX MICROSCOPIC
Glucose, UA: NEGATIVE mg/dL
Ketones, ur: >80 mg/dL — AB
Leukocytes,Ua: NEGATIVE
Nitrite: NEGATIVE
Protein, ur: NEGATIVE mg/dL
Specific Gravity, Urine: >1.03 — ABNORMAL HIGH (ref 1.005–1.030)
pH: 5.5 (ref 5.0–8.0)

## 2020-02-24 LAB — URINALYSIS, MICROSCOPIC (REFLEX)

## 2020-02-24 LAB — WET PREP, GENITAL
Clue Cells Wet Prep HPF POC: NONE SEEN
Sperm: NONE SEEN
Trich, Wet Prep: NONE SEEN
Yeast Wet Prep HPF POC: NONE SEEN

## 2020-02-24 MED ORDER — FAMOTIDINE IN NACL 20-0.9 MG/50ML-% IV SOLN
20.0000 mg | Freq: Once | INTRAVENOUS | Status: AC
  Administered 2020-02-24: 20 mg via INTRAVENOUS
  Filled 2020-02-24: qty 50

## 2020-02-24 MED ORDER — SODIUM CHLORIDE 0.9 % IV SOLN
8.0000 mg | Freq: Once | INTRAVENOUS | Status: AC
  Administered 2020-02-24: 8 mg via INTRAVENOUS
  Filled 2020-02-24: qty 4

## 2020-02-24 MED ORDER — SODIUM CHLORIDE 0.9 % IV SOLN
25.0000 mg | Freq: Once | INTRAVENOUS | Status: AC
  Administered 2020-02-24: 25 mg via INTRAVENOUS
  Filled 2020-02-24: qty 1

## 2020-02-24 MED ORDER — METHYLPREDNISOLONE SODIUM SUCC 125 MG IJ SOLR: 48.0000 mg | Freq: Once | INTRAMUSCULAR | Status: DC

## 2020-02-24 NOTE — MAU Provider Note (Addendum)
History     Patient Active Problem List   Diagnosis Date Noted  . Hyperemesis affecting pregnancy, antepartum 02/20/2020  . Supervision of other normal pregnancy, antepartum 02/20/2020  . History of reversal of tubal ligation 12/20/2019  . Adenomyosis 05/09/2019  . Elevated blood pressure reading 05/09/2019  . Desire for pregnancy 02/13/2019  . Anxiety 11/28/2018  . Endometriosis 05/23/2018  . Vaginal pain 05/09/2018  . Cholecystitis, acute with cholelithiasis 11/25/2017  . Fibroid of cervix   . Cervical mass 12/23/2016  . Child behavior problem 03/27/2016  . Malabsorption 08/29/2013  . 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 B12 deficiency 06/27/2013  . Insomnia 06/27/2013  . Adjustment disorder with mixed anxiety and depressed mood 03/13/2013  . Multiple sclerosis ([place]) 03/13/2013  . Abdominal pain 07/29/2007  . OBESITY, NOS 08/12/2006    Chief Complaint  Patient presents with  . Vaginal Discharge  . Nausea  . Emesis  . Abdominal Pain   [name] is a 34 y.o. [id number] at [redacted]w[redacted]d who presents to MAU for hyperemesis x2 days (since she stopped taking her steroid taper). She has presented to MAU 10x since the beginning of this pregnancy. She does not take oral meds as she says they do not work. She states that IV steroids + a taper work as long as she is on them and wants to know if she can take steroids until the middle of her pregnancy. She denies cramping, spotting, SOB, syncope, or dizziness. She endorses increased vaginal discharge.   OB History    Gravida  7   Para  3   Term  0   Preterm  3   AB  3   Living  4     SAB      TAB  3   Ectopic      Multiple  1   Live Births  4        Obstetric Comments  Induced early due to Pre- E with all prenancies        Past Medical History:  Diagnosis Date  . Abnormal Pap smear    Colpo>normal  . Fibroid   . Gonorrhea   . Headache    last migraine 01/2017  . History of IBS    watch  diet, no meds  . Infection    UTI  . Multiple sclerosis ([place])   . Neuromuscular disorder [facility])    diagnosed with MS this visit  . Obesity   . Ovarian cyst   . Pernicious anemia 05/02/2013  . Pregnancy induced hypertension   . Preterm labor    all 3 deliveries  . SVD (spontaneous vaginal delivery)    x 3  . Urinary tract infection   . Vaginal Pap smear, abnormal     Past Surgical History:  Procedure Laterality Date  . CHOLECYSTECTOMY N/A 11/25/2017   Procedure: LAPAROSCOPIC CHOLECYSTECTOMY WITH INTRAOPERATIVE CHOLANGIOGRAM;  Surgeon: Armandina Gemma, MD;  Location: [facility];  Service: General;  Laterality: N/A;  . COLPOSCOPY    . MYOMECTOMY N/A 04/01/2017   Procedure: Vaginal Myomectomy;  Surgeon: Osborne Oman, MD;  Location: [place] [facility]S;  Service: Gynecology;  Laterality: N/A;  . TUBAL LIGATION     2012/ reversal June 2020  . WISDOM TOOTH EXTRACTION      Family History  Problem Relation Age of Onset  . Diabetes Mother   . Hypertension Mother   . Diabetes Father   . Hypertension Father   . Kidney disease Father   .  Stroke Father   . Learning disabilities Brother   . Anesthesia problems Neg Hx   . Other Neg Hx     Social History   Tobacco Use  . Smoking status: Never Smoker  . Smokeless tobacco: Never Used  Vaping Use  . Vaping Use: Never used  Substance Use Topics  . Alcohol use: No    Alcohol/week: 0.0 standard drinks    Comment:    . Drug use: No    Comment: denies use, states was given CBD gummies for nausea    Allergies: No Known Allergies  Medications Prior to Admission  Medication Sig Dispense Refill Last Dose  . metoCLOPramide (REGLAN) 10 MG tablet Take 1 tablet (10 mg total) by mouth 3 (three) times daily with meals. 90 tablet 0 02/24/2020 at 1200  . promethazine (PHENERGAN) 12.5 MG tablet Take 1-2 tablets (12.5-25 mg total) by mouth every 6 (six) hours as needed for nausea or vomiting. 30 tablet 2 02/24/2020 at 0630  . scopolamine (TRANSDERM-SCOP) 1  MG/3DAYS Place 1 patch (1.5 mg total) onto the skin every 3 (three) days. 10 patch 12 02/23/2020 at Unknown time  . Blood Pressure Monitoring (BLOOD PRESSURE KIT) DEVI 1 kit by Does not apply route once a week. Check Blood Pressure regularly and record readings into the [employer] App.  Large Cuff.  DX O90.0 1 each 0   . docusate sodium (COLACE) 100 MG capsule Take 1 capsule (100 mg total) by mouth 2 (two) times daily as needed for mild constipation. 30 capsule 2   . methylPREDNISolone (MEDROL DOSEPAK) 4 MG TBPK tablet See Taper instructions. 60 tablet 0   . ondansetron (ZOFRAN-ODT) 4 MG disintegrating tablet Take 1 tablet (4 mg total) by mouth every 8 (eight) hours as needed for nausea or vomiting. 30 tablet 1   . pantoprazole (PROTONIX) 20 MG tablet Take 1 tablet (20 mg total) by mouth daily. (Patient not taking: Reported on 02/07/2020) 30 tablet 1   . prochlorperazine (COMPAZINE) 10 MG tablet Take 1 tablet (10 mg total) by mouth 2 (two) times daily as needed for nausea or vomiting. (Patient not taking: Reported on 02/20/2020) 30 tablet 0   . sertraline (ZOLOFT) 25 MG tablet TAKE 1 TABLET BY MOUTH EVERY DAY (Patient not taking: Reported on 02/07/2020) 90 tablet 1     Review of Systems  Constitutional: Negative for weight loss (weight increased while on steroid taper because she was eating well).  Gastrointestinal: Positive for nausea and vomiting. Negative for constipation and diarrhea.  Neurological: Negative for dizziness and headaches.  All other systems reviewed and are negative.   See HPI Above Physical Exam   Blood pressure 135/72, pulse 85, temperature 98.7 F (37.1 C), temperature source Oral, resp. rate 16, height 5\' 4"  (1.626 m), weight 229 lb 9.6 oz (104.1 kg), last menstrual period 11/17/2019, SpO2 99 %, unknown if currently breastfeeding.  Results for orders placed or performed during the hospital encounter of 02/24/20 (from the past 24 hour(s))  Urinalysis, Routine w reflex  microscopic Urine, Clean Catch     Status: Abnormal   Collection Time: 02/24/20  6:49 PM  Result Value Ref Range   Color, Urine YELLOW YELLOW   APPearance HAZY (A) CLEAR   Specific Gravity, Urine >1.030 (H) 1.005 - 1.030   pH 5.5 5.0 - 8.0   Glucose, UA NEGATIVE NEGATIVE mg/dL   Hgb urine dipstick LARGE (A) NEGATIVE   Bilirubin Urine SMALL (A) NEGATIVE   Ketones, ur >80 (A) NEGATIVE mg/dL  Protein, ur NEGATIVE NEGATIVE mg/dL   Nitrite NEGATIVE NEGATIVE   Leukocytes,Ua NEGATIVE NEGATIVE  Urinalysis, Microscopic (reflex)     Status: Abnormal   Collection Time: 02/24/20  6:49 PM  Result Value Ref Range   RBC / HPF 21-50 0 - 5 RBC/hpf   WBC, UA 0-5 0 - 5 WBC/hpf   Bacteria, UA FEW (A) NONE SEEN   Squamous Epithelial / LPF 21-50 0 - 5   Mucus PRESENT   Wet prep, genital     Status: Abnormal   Collection Time: 02/24/20  7:47 PM   Specimen: Vaginal  Result Value Ref Range   Yeast Wet Prep HPF POC NONE SEEN NONE SEEN   Trich, Wet Prep NONE SEEN NONE SEEN   Clue Cells Wet Prep HPF POC NONE SEEN NONE SEEN   WBC, Wet Prep HPF POC MODERATE (A) NONE SEEN   Sperm NONE SEEN   Comprehensive metabolic panel     Status: Abnormal   Collection Time: 02/24/20  7:51 PM  Result Value Ref Range   Sodium 132 (L) 135 - 145 mmol/L   Potassium 3.9 3.5 - 5.1 mmol/L   Chloride 97 (L) 98 - 111 mmol/L   CO2 22 22 - 32 mmol/L   Glucose, Bld 80 70 - 99 mg/dL   BUN 6 6 - 20 mg/dL   Creatinine, Ser 0.74 0.44 - 1.00 mg/dL   Calcium 9.5 8.9 - 10.3 mg/dL   Total Protein 7.2 6.5 - 8.1 g/dL   Albumin 3.3 (L) 3.5 - 5.0 g/dL   AST 37 15 - 41 U/L   ALT 36 0 - 44 U/L   Alkaline Phosphatase 54 38 - 126 U/L   Total Bilirubin 1.0 0.3 - 1.2 mg/dL   GFR calc non Af Amer >60 >60 mL/min   GFR calc Af Amer >60 >60 mL/min   Anion gap 13 5 - 15    Physical Exam Vitals and nursing note reviewed.  Constitutional:      Appearance: She is well-developed. She is obese.  Eyes:     Extraocular Movements: Extraocular  movements intact.     Pupils: Pupils are equal, round, and reactive to light.  Cardiovascular:     Rate and Rhythm: Normal rate and regular rhythm.  Pulmonary:     Effort: Pulmonary effort is normal.  Skin:    General: Skin is warm and dry.     Capillary Refill: Capillary refill takes less than 2 seconds.  Neurological:     Mental Status: She is alert and oriented to person, place, and time.  Psychiatric:        Mood and Affect: Mood normal.        Behavior: Behavior normal.     ED Course  Assessment: Self-swabs given for wet prep and gc/ct IV phenergan + pepcid IVPB started Need to discuss additional steroid dosing with MD  Care turned over to Gavin Pound, CNM at 2118  Gaylan Gerold, CNM, MSN, [facility] 02/24/20 9:18 PM   Plan: -Dr. Harolyn Rutherford contacted regarding patient status and desire for additional steroid dosing. Advised: *Give regime based on other antiemetics *Does not advise prolonged or continued usage of steroids. -Provider goes to bedside and discussed MD recommendation. -Patient agreeable, but expresses concern with lack of plan regarding nausea and vomiting. -She states she takes all medications and continues to have nausea and vomiting. -She reports she has had continuous Zofran pump and feeding tube in previous pregnancies.  -She states she is no longer  vomiting, but is continuing to have gagging.  -Will order Zofran and try oral challenge.  Follow Up 11:29 PM  -Zofran complete -Will discharge to home with precautions. -Message sent to Dr. Johnney Ou regarding patient requests for Zofran infusion pump. -Encouraged to call or return to MAU if symptoms worsen or with the onset of new symptoms. -Discharged to home in stable condition.  Maryann Conners MSN, CNM Advanced Practice Provider, [facility]

## 2020-02-24 NOTE — MAU Note (Signed)
Pepcid completed-pt reports feeling better-with less nausea-IV Phenergan continues-site clear-pt denies pain or discomfort

## 2020-02-24 NOTE — Progress Notes (Signed)
Changed cuff size to better fit pt.

## 2020-02-24 NOTE — MAU Note (Signed)
[name] is a 34 y.o. at [redacted]w[redacted]d here in MAU reporting: persistent vomiting. For the past 2 days has been having lower abdominal cramping. Also thinks she might be leaking something, it is clear. No bleeding.   Onset of complaint: ongoing  Pain score: 6/10  Vitals:   02/24/20 1841  BP: (!) 98/50  Pulse: (!) 102  Resp: 16  Temp: 98.7 F (37.1 C)  SpO2: 99%     Lab orders placed from triage: UA

## 2020-02-26 ENCOUNTER — Ambulatory Visit (INDEPENDENT_AMBULATORY_CARE_PROVIDER_SITE_OTHER): Payer: Medicare HMO | Admitting: Obstetrics

## 2020-02-26 ENCOUNTER — Other Ambulatory Visit: Payer: Self-pay

## 2020-02-26 ENCOUNTER — Other Ambulatory Visit: Payer: Self-pay | Admitting: Obstetrics

## 2020-02-26 ENCOUNTER — Other Ambulatory Visit (HOSPITAL_COMMUNITY)
Admission: RE | Admit: 2020-02-26 | Discharge: 2020-02-26 | Disposition: A | Discharge status: Home / Self Care | Payer: Medicare HMO | Source: Ambulatory Visit | Attending: Obstetrics | Admitting: Obstetrics

## 2020-02-26 ENCOUNTER — Encounter: Payer: Self-pay | Admitting: Obstetrics

## 2020-02-26 VITALS — BP 112/78 | HR 70 | Wt 232.0 lb

## 2020-02-26 DIAGNOSIS — O09522 Supervision of elderly multigravida, second trimester: Secondary | ICD-10-CM

## 2020-02-26 DIAGNOSIS — O210 Mild hyperemesis gravidarum: Secondary | ICD-10-CM | POA: Diagnosis not present

## 2020-02-26 DIAGNOSIS — Z9889 Other specified postprocedural states: Secondary | ICD-10-CM

## 2020-02-26 DIAGNOSIS — Z8751 Personal history of pre-term labor: Secondary | ICD-10-CM | POA: Diagnosis not present

## 2020-02-26 DIAGNOSIS — O99212 Obesity complicating pregnancy, second trimester: Secondary | ICD-10-CM

## 2020-02-26 DIAGNOSIS — Z23 Encounter for immunization: Secondary | ICD-10-CM | POA: Diagnosis not present

## 2020-02-26 DIAGNOSIS — O0992 Supervision of high risk pregnancy, unspecified, second trimester: Secondary | ICD-10-CM | POA: Insufficient documentation

## 2020-02-26 DIAGNOSIS — O0990 Supervision of high risk pregnancy, unspecified, unspecified trimester: Secondary | ICD-10-CM

## 2020-02-26 DIAGNOSIS — Z3A14 14 weeks gestation of pregnancy: Secondary | ICD-10-CM | POA: Insufficient documentation

## 2020-02-26 DIAGNOSIS — O99210 Obesity complicating pregnancy, unspecified trimester: Secondary | ICD-10-CM

## 2020-02-26 LAB — GC/CHLAMYDIA PROBE AMP (~~LOC~~) NOT AT ARMC
Chlamydia: NEGATIVE
Comment: NEGATIVE
Comment: NORMAL
Neisseria Gonorrhea: NEGATIVE

## 2020-02-26 LAB — URINE CULTURE

## 2020-02-26 NOTE — Progress Notes (Signed)
Subjective:    [name] is being seen today for her first obstetrical visit.  This is a planned pregnancy. She is at [redacted]w[redacted]d gestation. Her obstetrical history is significant for nausea and vomiting. Relationship with FOB: significant other, living together. Patient does intend to breast feed. Pregnancy history fully reviewed.  The information documented in the HPI was reviewed and verified.  Menstrual History: OB History    Gravida  7   Para  3   Term  0   Preterm  3   AB  3   Living  4     SAB      TAB  3   Ectopic      Multiple  1   Live Births  4        Obstetric Comments  Induced early due to Pre- E with all prenancies         Patient's last menstrual period was 11/17/2019.    Past Medical History:  Diagnosis Date  . Abnormal Pap smear    Colpo>normal  . Fibroid   . Gonorrhea   . Headache    last migraine 01/2017  . History of IBS    watch diet, no meds  . Infection    UTI  . Multiple sclerosis ([place])   . Neuromuscular disorder [facility])    diagnosed with MS this visit  . Obesity   . Ovarian cyst   . Pernicious anemia 05/02/2013  . Pregnancy induced hypertension   . Preterm labor    all 3 deliveries  . SVD (spontaneous vaginal delivery)    x 3  . Urinary tract infection   . Vaginal Pap smear, abnormal     Past Surgical History:  Procedure Laterality Date  . CHOLECYSTECTOMY N/A 11/25/2017   Procedure: LAPAROSCOPIC CHOLECYSTECTOMY WITH INTRAOPERATIVE CHOLANGIOGRAM;  Surgeon: Armandina Gemma, MD;  Location: [facility];  Service: General;  Laterality: N/A;  . COLPOSCOPY    . MYOMECTOMY N/A 04/01/2017   Procedure: Vaginal Myomectomy;  Surgeon: Osborne Oman, MD;  Location: [facility];  Service: Gynecology;  Laterality: N/A;  . TUBAL LIGATION     2012/ reversal June 2020  . WISDOM TOOTH EXTRACTION      (Not in a hospital admission)  No Known Allergies  Social History   Tobacco Use  . Smoking status: Never Smoker  . Smokeless tobacco: Never  Used  Substance Use Topics  . Alcohol use: No    Alcohol/week: 0.0 standard drinks    Comment:      Family History  Problem Relation Age of Onset  . Diabetes Mother   . Hypertension Mother   . Diabetes Father   . Hypertension Father   . Kidney disease Father   . Stroke Father   . Learning disabilities Brother   . Anesthesia problems Neg Hx   . Other Neg Hx      Review of Systems Constitutional: negative for weight loss Gastrointestinal: positive for nausea and vomiting Genitourinary:negative for genital lesions and vaginal discharge and dysuria Musculoskeletal:negative for back pain Behavioral/Psych: negative for abusive relationship, depression, illegal drug usage and tobacco use    Objective:    Wt 232 lb (105.2 kg)   LMP 11/17/2019   BMI 39.82 kg/m  General Appearance:    Alert, cooperative, no distress, appears stated age  Head:    Normocephalic, without obvious abnormality, atraumatic  Eyes:    PERRL, conjunctiva/corneas clear, EOM's intact, fundi    benign, both eyes  Ears:  Normal TM's and external ear canals, both ears  Nose:   Nares normal, septum midline, mucosa normal, no drainage    or sinus tenderness  Throat:   Lips, mucosa, and tongue normal; teeth and gums normal  Neck:   Supple, symmetrical, trachea midline, no adenopathy;    thyroid:  no enlargement/tenderness/nodules; no carotid   bruit or JVD  Back:     Symmetric, no curvature, ROM normal, no CVA tenderness  Lungs:     Clear to auscultation bilaterally, respirations unlabored  Chest Wall:    No tenderness or deformity   Heart:    Regular rate and rhythm, S1 and S2 normal, no murmur, rub   or gallop  Breast Exam:    No tenderness, masses, or nipple abnormality  Abdomen:     Soft, non-tender, bowel sounds active all four quadrants,    no masses, no organomegaly  Genitalia:    Normal female without lesion, discharge or tenderness  Extremities:   Extremities normal, atraumatic, no cyanosis or edema   Pulses:   2+ and symmetric all extremities  Skin:   Skin color, texture, turgor normal, no rashes or lesions  Lymph nodes:   Cervical, supraclavicular, and axillary nodes normal  Neurologic:   CNII-XII intact, normal strength, sensation and reflexes    throughout      Lab Review Urine pregnancy test Labs reviewed yes Radiologic studies reviewed no  Assessment:    Pregnancy at [redacted]w[redacted]d weeks    Plan:     1. Multigravida of advanced maternal age in second trimester  2. History of preterm delivery x 4 - consider weekly 17 OH-P caproate starting at 16 weeks  3. Hyperemesis affecting pregnancy, antepartum - referred to [facility] for Phenergan infusions twice a week  4. H/O myomectomy - vaginal  5. Obesity affecting pregnancy - start Baby ASA at 16 weeks   Prenatal vitamins.  Counseling provided regarding continued use of seat belts, cessation of alcohol consumption, smoking or use of illicit drugs; infection precautions i.e., influenza/TDAP immunizations, toxoplasmosis,CMV, parvovirus, listeria and varicella; workplace safety, exercise during pregnancy; routine dental care, safe medications, sexual activity, hot tubs, saunas, pools, travel, caffeine use, fish and methlymercury, potential toxins, hair treatments, varicose veins Weight gain recommendations per IOM guidelines reviewed: underweight/BMI< 18.5--> gain 28 - 40 lbs; normal weight/BMI 18.5 - 24.9--> gain 25 - 35 lbs; overweight/BMI 25 - 29.9--> gain 15 - 25 lbs; obese/BMI >30->gain  11 - 20 lbs Problem list reviewed and updated. FIRST/CF mutation testing/NIPT/QUAD SCREEN/fragile X/Ashkenazi Jewish population testing/Spinal muscular atrophy discussed: requested. Role of ultrasound in pregnancy discussed; fetal survey: requested. Amniocentesis discussed: not indicated  Follow up in 2 weeks. 50% of 20 min visit spent on counseling and coordination of care.     Shelly Bombard, MD 02/26/2020 10:23 AM

## 2020-02-26 NOTE — Progress Notes (Signed)
Pt presents for NOB  This is a planned pregnancy and FOB is involved.  Pt completed Covid vaccines Flu vaccine given today LD without difficulty

## 2020-02-27 ENCOUNTER — Other Ambulatory Visit: Payer: Self-pay | Admitting: Family Medicine

## 2020-02-27 LAB — HCV INTERPRETATION

## 2020-02-27 LAB — CBC/D/PLT+RPR+RH+ABO+RUB AB...
Antibody Screen: NEGATIVE
Basophils Absolute: 0 10*3/uL (ref 0.0–0.2)
Basos: 1 %
EOS (ABSOLUTE): 0 10*3/uL (ref 0.0–0.4)
Eos: 1 %
HCV Ab: <0.1 s/co ratio (ref 0.0–0.9)
HIV Screen 4th Generation wRfx: NONREACTIVE
Hematocrit: 34.5 % (ref 34.0–46.6)
Hemoglobin: 11 g/dL — ABNORMAL LOW (ref 11.1–15.9)
Hepatitis B Surface Ag: NEGATIVE
Immature Grans (Abs): 0 10*3/uL (ref 0.0–0.1)
Immature Granulocytes: 0 %
Lymphocytes Absolute: 1.4 10*3/uL (ref 0.7–3.1)
Lymphs: 21 %
MCH: 30.5 pg (ref 26.6–33.0)
MCHC: 31.9 g/dL (ref 31.5–35.7)
MCV: 96 fL (ref 79–97)
Monocytes Absolute: 0.6 10*3/uL (ref 0.1–0.9)
Monocytes: 9 %
Neutrophils Absolute: 4.5 10*3/uL (ref 1.4–7.0)
Neutrophils: 68 %
Platelets: 281 10*3/uL (ref 150–450)
RBC: 3.61 x10E6/uL — ABNORMAL LOW (ref 3.77–5.28)
RDW: 12.1 % (ref 11.7–15.4)
RPR Ser Ql: NONREACTIVE
Rh Factor: POSITIVE
Rubella Antibodies, IGG: 1.63 index (ref >0.99)
WBC: 6.5 10*3/uL (ref 3.4–10.8)

## 2020-02-27 NOTE — Addendum Note (Signed)
Addended by: Donnamae Jude on: 02/27/2020 03:41 PM   Modules accepted: Orders

## 2020-02-28 ENCOUNTER — Encounter (HOSPITAL_COMMUNITY)
Admission: RE | Admit: 2020-02-28 | Discharge: 2020-02-28 | Disposition: A | Discharge status: Home / Self Care | Payer: Medicare HMO | Source: Ambulatory Visit | Attending: Advanced Practice Midwife | Admitting: Advanced Practice Midwife

## 2020-02-28 ENCOUNTER — Other Ambulatory Visit: Payer: Self-pay

## 2020-02-28 DIAGNOSIS — R1110 Vomiting, unspecified: Secondary | ICD-10-CM | POA: Diagnosis not present

## 2020-02-28 LAB — URINE CULTURE

## 2020-02-28 MED ORDER — SODIUM CHLORIDE 0.9 % IV SOLN
25.0000 mg | Freq: Once | INTRAVENOUS | Status: AC
  Administered 2020-02-28: 25 mg via INTRAVENOUS
  Filled 2020-02-28: qty 1

## 2020-02-29 ENCOUNTER — Other Ambulatory Visit (HOSPITAL_COMMUNITY): Payer: Self-pay | Admitting: *Deleted

## 2020-02-29 LAB — CYTOLOGY - PAP
Comment: NEGATIVE
Diagnosis: NEGATIVE
High risk HPV: NEGATIVE

## 2020-03-01 ENCOUNTER — Other Ambulatory Visit: Payer: Self-pay

## 2020-03-01 ENCOUNTER — Encounter (HOSPITAL_COMMUNITY)
Admission: RE | Admit: 2020-03-01 | Discharge: 2020-03-01 | Disposition: A | Discharge status: Home / Self Care | Payer: Medicare HMO | Source: Ambulatory Visit | Attending: Obstetrics | Admitting: Obstetrics

## 2020-03-01 DIAGNOSIS — R1110 Vomiting, unspecified: Secondary | ICD-10-CM | POA: Diagnosis not present

## 2020-03-01 MED ORDER — SODIUM CHLORIDE 0.9 % IV SOLN
25.0000 mg | INTRAVENOUS | Status: DC
  Administered 2020-03-01: 25 mg via INTRAVENOUS
  Filled 2020-03-01: qty 1

## 2020-03-04 ENCOUNTER — Encounter (HOSPITAL_COMMUNITY)
Admission: RE | Admit: 2020-03-04 | Discharge: 2020-03-04 | Disposition: A | Discharge status: Home / Self Care | Payer: Medicare HMO | Source: Ambulatory Visit | Attending: Obstetrics | Admitting: Obstetrics

## 2020-03-04 ENCOUNTER — Other Ambulatory Visit: Payer: Self-pay

## 2020-03-04 ENCOUNTER — Encounter: Payer: Self-pay | Admitting: Obstetrics

## 2020-03-04 DIAGNOSIS — R1110 Vomiting, unspecified: Secondary | ICD-10-CM | POA: Diagnosis not present

## 2020-03-04 MED ORDER — SODIUM CHLORIDE 0.9 % IV SOLN
25.0000 mg | INTRAVENOUS | Status: DC
  Administered 2020-03-04: 25 mg via INTRAVENOUS
  Filled 2020-03-04: qty 1

## 2020-03-06 ENCOUNTER — Encounter: Payer: Self-pay | Admitting: Obstetrics

## 2020-03-06 ENCOUNTER — Encounter (HOSPITAL_COMMUNITY)
Admission: RE | Admit: 2020-03-06 | Discharge: 2020-03-06 | Disposition: A | Discharge status: Home / Self Care | Payer: Medicare HMO | Source: Ambulatory Visit | Attending: Obstetrics | Admitting: Obstetrics

## 2020-03-06 DIAGNOSIS — R1110 Vomiting, unspecified: Secondary | ICD-10-CM | POA: Diagnosis not present

## 2020-03-06 MED ORDER — SODIUM CHLORIDE 0.9 % IV SOLN
25.0000 mg | INTRAVENOUS | Status: DC
  Administered 2020-03-06: 25 mg via INTRAVENOUS
  Filled 2020-03-06: qty 1

## 2020-03-08 ENCOUNTER — Ambulatory Visit (HOSPITAL_COMMUNITY): Payer: Medicare HMO

## 2020-03-11 ENCOUNTER — Other Ambulatory Visit: Payer: Self-pay

## 2020-03-11 ENCOUNTER — Encounter: Payer: Self-pay | Admitting: Obstetrics and Gynecology

## 2020-03-11 ENCOUNTER — Encounter: Payer: Medicare HMO | Admitting: Advanced Practice Midwife

## 2020-03-11 ENCOUNTER — Ambulatory Visit (INDEPENDENT_AMBULATORY_CARE_PROVIDER_SITE_OTHER): Payer: Medicare HMO | Admitting: Obstetrics and Gynecology

## 2020-03-11 ENCOUNTER — Encounter: Payer: Medicare HMO | Admitting: Obstetrics and Gynecology

## 2020-03-11 VITALS — BP 94/65 | HR 80 | Wt 244.6 lb

## 2020-03-11 DIAGNOSIS — O210 Mild hyperemesis gravidarum: Secondary | ICD-10-CM

## 2020-03-11 DIAGNOSIS — Z3480 Encounter for supervision of other normal pregnancy, unspecified trimester: Secondary | ICD-10-CM

## 2020-03-11 NOTE — Progress Notes (Signed)
   PRENATAL VISIT NOTE  Subjective:  [name] is a 34 y.o. [id number] at [redacted]w[redacted]d being seen today for ongoing prenatal care.  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  Patient reports nausea.  Contractions: Not present. Vag. Bleeding: None.  Movement: Present. Denies leaking of fluid.   The following portions of the patient's history were reviewed and updated as appropriate: allergies, current medications, past family history, past medical history, past social history, past surgical history and problem list.   Objective:   Vitals:   03/11/20 1102  BP: 94/65  Pulse: 80  Weight: 244 lb 9.6 oz (110.9 kg)    Fetal Status: Fetal Heart Rate (bpm): 150   Movement: Present     General:  Alert, oriented and cooperative. Patient is in no acute distress.  Skin: Skin is warm and dry. No rash noted.   Cardiovascular: Normal heart rate noted  Respiratory: Normal respiratory effort, no problems with respiration noted  Abdomen: Soft, gravid, appropriate for gestational age.  Pain/Pressure: Absent     Pelvic: Cervical exam deferred        Extremities: Normal range of motion.  Edema: None  Mental Status: Normal mood and affect. Normal behavior. Normal judgment and thought content.   Assessment and Plan:  Pregnancy: [id number] at [redacted]w[redacted]d 1. Supervision of other normal pregnancy, antepartum Patient is  doing well AFP today Anatomy ultrasound ordered  2. Hyperemesis affecting pregnancy, antepartum Continue phenergan infusions  Preterm labor symptoms and general obstetric precautions including but not limited to vaginal bleeding, contractions, leaking of fluid and fetal movement were reviewed in detail with the patient. Please refer to After Visit Summary for other counseling recommendations.   Return in about 4 weeks (around 04/08/2020) for in person, ROB, Low risk.  Future Appointments  Date Time Provider [place]  03/13/2020 11:00 AM [id number] None    Mora Bellman, MD

## 2020-03-11 NOTE — Progress Notes (Signed)
Pt reports feeling fetal flutters, denies pain, still complains of nausea.

## 2020-03-13 ENCOUNTER — Other Ambulatory Visit: Payer: Self-pay

## 2020-03-13 ENCOUNTER — Encounter (HOSPITAL_COMMUNITY)
Admission: RE | Admit: 2020-03-13 | Discharge: 2020-03-13 | Disposition: A | Discharge status: Home / Self Care | Payer: Medicare HMO | Source: Ambulatory Visit | Attending: Obstetrics | Admitting: Obstetrics

## 2020-03-13 DIAGNOSIS — R1110 Vomiting, unspecified: Secondary | ICD-10-CM | POA: Diagnosis not present

## 2020-03-13 LAB — AFP, SERUM, OPEN SPINA BIFIDA
AFP MoM: 1.26
AFP Value: 36.3 ng/mL
Gest. Age on Collection Date: 16.3 weeks
Maternal Age At EDD: 36.7 yr
OSBR Risk 1 IN: 10000
Test Results:: NEGATIVE
Weight: 244 [lb_av]

## 2020-03-13 MED ORDER — SODIUM CHLORIDE 0.9 % IV SOLN
25.0000 mg | INTRAVENOUS | Status: DC
  Administered 2020-03-13: 25 mg via INTRAVENOUS
  Filled 2020-03-13: qty 1

## 2020-03-19 ENCOUNTER — Encounter (HOSPITAL_COMMUNITY)
Admission: RE | Admit: 2020-03-19 | Discharge: 2020-03-19 | Disposition: A | Discharge status: Home / Self Care | Payer: Medicare HMO | Source: Ambulatory Visit | Attending: Advanced Practice Midwife | Admitting: Advanced Practice Midwife

## 2020-03-19 ENCOUNTER — Other Ambulatory Visit: Payer: Self-pay

## 2020-03-19 DIAGNOSIS — R1110 Vomiting, unspecified: Secondary | ICD-10-CM | POA: Diagnosis not present

## 2020-03-19 MED ORDER — SODIUM CHLORIDE 0.9 % IV SOLN
25.0000 mg | INTRAVENOUS | Status: DC
  Administered 2020-03-19: 25 mg via INTRAVENOUS
  Filled 2020-03-19: qty 1

## 2020-03-22 ENCOUNTER — Encounter (HOSPITAL_COMMUNITY)
Admission: RE | Admit: 2020-03-22 | Discharge: 2020-03-22 | Disposition: A | Discharge status: Home / Self Care | Payer: Medicare HMO | Source: Ambulatory Visit | Attending: Obstetrics | Admitting: Obstetrics

## 2020-03-22 DIAGNOSIS — R1110 Vomiting, unspecified: Secondary | ICD-10-CM | POA: Diagnosis not present

## 2020-03-22 MED ORDER — SODIUM CHLORIDE 0.9 % IV SOLN
25.0000 mg | INTRAVENOUS | Status: DC
  Administered 2020-03-22: 25 mg via INTRAVENOUS
  Filled 2020-03-22: qty 1

## 2020-03-26 ENCOUNTER — Other Ambulatory Visit: Payer: Self-pay

## 2020-03-26 ENCOUNTER — Encounter (HOSPITAL_COMMUNITY)
Admission: RE | Admit: 2020-03-26 | Discharge: 2020-03-26 | Disposition: A | Discharge status: Home / Self Care | Payer: Medicare HMO | Source: Ambulatory Visit | Attending: Obstetrics | Admitting: Obstetrics

## 2020-03-26 DIAGNOSIS — R1110 Vomiting, unspecified: Secondary | ICD-10-CM | POA: Diagnosis not present

## 2020-03-26 MED ORDER — SODIUM CHLORIDE 0.9 % IV SOLN
25.0000 mg | INTRAVENOUS | Status: DC
  Administered 2020-03-26: 25 mg via INTRAVENOUS
  Filled 2020-03-26: qty 1

## 2020-03-29 ENCOUNTER — Encounter (HOSPITAL_COMMUNITY)
Admission: RE | Admit: 2020-03-29 | Discharge: 2020-03-29 | Disposition: A | Discharge status: Home / Self Care | Payer: Medicare HMO | Source: Ambulatory Visit | Attending: Obstetrics | Admitting: Obstetrics

## 2020-03-29 ENCOUNTER — Other Ambulatory Visit: Payer: Self-pay | Admitting: *Deleted

## 2020-03-29 ENCOUNTER — Ambulatory Visit: Payer: Medicare HMO

## 2020-03-29 ENCOUNTER — Other Ambulatory Visit: Payer: Self-pay

## 2020-03-29 ENCOUNTER — Encounter: Payer: Self-pay | Admitting: *Deleted

## 2020-03-29 ENCOUNTER — Ambulatory Visit: Payer: Medicare HMO | Attending: Obstetrics and Gynecology

## 2020-03-29 ENCOUNTER — Ambulatory Visit: Payer: Medicare HMO | Admitting: *Deleted

## 2020-03-29 DIAGNOSIS — Z3A19 19 weeks gestation of pregnancy: Secondary | ICD-10-CM | POA: Diagnosis not present

## 2020-03-29 DIAGNOSIS — R1110 Vomiting, unspecified: Secondary | ICD-10-CM | POA: Diagnosis not present

## 2020-03-29 DIAGNOSIS — O358XX0 Maternal care for other (suspected) fetal abnormality and damage, not applicable or unspecified: Secondary | ICD-10-CM

## 2020-03-29 DIAGNOSIS — O283 Abnormal ultrasonic finding on antenatal screening of mother: Secondary | ICD-10-CM | POA: Diagnosis present

## 2020-03-29 DIAGNOSIS — Z3480 Encounter for supervision of other normal pregnancy, unspecified trimester: Secondary | ICD-10-CM

## 2020-03-29 DIAGNOSIS — G35 Multiple sclerosis: Secondary | ICD-10-CM | POA: Diagnosis not present

## 2020-03-29 DIAGNOSIS — O99352 Diseases of the nervous system complicating pregnancy, second trimester: Secondary | ICD-10-CM

## 2020-03-29 DIAGNOSIS — O210 Mild hyperemesis gravidarum: Secondary | ICD-10-CM

## 2020-03-29 DIAGNOSIS — IMO0002 Reserved for concepts with insufficient information to code with codable children: Secondary | ICD-10-CM

## 2020-03-29 MED ORDER — SODIUM CHLORIDE 0.9 % IV SOLN
25.0000 mg | INTRAVENOUS | Status: DC
  Administered 2020-03-29: 25 mg via INTRAVENOUS
  Filled 2020-03-29: qty 1

## 2020-03-30 LAB — CMV IGM: CMV IgM Ser EIA-aCnc: <30 AU/mL (ref 0.0–29.9)

## 2020-03-30 LAB — TOXOPLASMA GONDII ANTIBODY, IGM: Toxoplasma Antibody- IgM: <3 AU/mL (ref 0.0–7.9)

## 2020-03-30 LAB — CMV ANTIBODY, IGG (EIA): CMV Ab - IgG: 1.8 U/mL — ABNORMAL HIGH (ref 0.00–0.59)

## 2020-03-30 LAB — TOXOPLASMA GONDII ANTIBODY, IGG: Toxoplasma IgG Ratio: <3 IU/mL (ref 0.0–7.1)

## 2020-03-30 LAB — INFECT DISEASE AB IGM REFLEX 1

## 2020-04-02 ENCOUNTER — Ambulatory Visit (HOSPITAL_COMMUNITY): Payer: Medicare HMO

## 2020-04-05 ENCOUNTER — Telehealth: Payer: Self-pay | Admitting: Obstetrics and Gynecology

## 2020-04-05 ENCOUNTER — Other Ambulatory Visit: Payer: Self-pay | Admitting: Family Medicine

## 2020-04-05 ENCOUNTER — Ambulatory Visit (HOSPITAL_COMMUNITY): Payer: Medicare HMO

## 2020-04-05 NOTE — Telephone Encounter (Signed)
[name] was contacted with the results of her CMV and toxoplasmosis testing.  These were drawn due to ultrasound findings in her pregnancy.  The results were negative for IgG and IgM for toxoplasmosis, indicating no past or recent infection with toxoplasmosis.  The results were positive for CMV IgG but negative for CMV IgM, indicating past exposure but no recent infection with CMV.    The patient is scheduled to return for a follow up ultrasound 4 weeks from her last visit.  Wilburt Finlay, MS, CGC

## 2020-04-08 ENCOUNTER — Ambulatory Visit (INDEPENDENT_AMBULATORY_CARE_PROVIDER_SITE_OTHER): Payer: Medicare HMO | Admitting: Advanced Practice Midwife

## 2020-04-08 ENCOUNTER — Other Ambulatory Visit: Payer: Self-pay

## 2020-04-08 VITALS — BP 107/66 | HR 77 | Wt 250.0 lb

## 2020-04-08 DIAGNOSIS — O0990 Supervision of high risk pregnancy, unspecified, unspecified trimester: Secondary | ICD-10-CM

## 2020-04-08 DIAGNOSIS — Z3480 Encounter for supervision of other normal pregnancy, unspecified trimester: Secondary | ICD-10-CM

## 2020-04-08 DIAGNOSIS — G35 Multiple sclerosis: Secondary | ICD-10-CM

## 2020-04-08 DIAGNOSIS — Z3A20 20 weeks gestation of pregnancy: Secondary | ICD-10-CM

## 2020-04-08 DIAGNOSIS — O99352 Diseases of the nervous system complicating pregnancy, second trimester: Secondary | ICD-10-CM

## 2020-04-08 DIAGNOSIS — O210 Mild hyperemesis gravidarum: Secondary | ICD-10-CM

## 2020-04-08 MED ORDER — COMFORT FIT MATERNITY SUPP MED MISC: 1.0000 | Freq: Every day | 0 refills | Status: DC

## 2020-04-08 MED ORDER — ONDANSETRON 4 MG PO TBDP: 4.0000 mg | ORAL_TABLET | Freq: Four times a day (QID) | ORAL | 5 refills | Status: DC | PRN

## 2020-04-08 MED ORDER — ONDANSETRON HCL 4 MG PO TABS: 4.0000 mg | ORAL_TABLET | Freq: Three times a day (TID) | ORAL | 5 refills | Status: DC | PRN

## 2020-04-08 NOTE — Patient Instructions (Signed)

## 2020-04-08 NOTE — Progress Notes (Signed)
   PRENATAL VISIT NOTE  Subjective:  [name] is a 34 y.o. [id number] at [redacted]w[redacted]d being seen today for ongoing prenatal care.  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  Patient reports occasionally seeing spots/floaters.  Contractions: Not present. Vag. Bleeding: None.  Movement: Present. Denies leaking of fluid.   The following portions of the patient's history were reviewed and updated as appropriate: allergies, current medications, past family history, past medical history, past social history, past surgical history and problem list.   Objective:   Vitals:   04/08/20 1117  BP: 107/66  Pulse: 77  Weight: 250 lb (113.4 kg)    Fetal Status: Fetal Heart Rate (bpm): 152   Movement: Present     General:  Alert, oriented and cooperative. Patient is in no acute distress.  Skin: Skin is warm and dry. No rash noted.   Cardiovascular: Normal heart rate noted  Respiratory: Normal respiratory effort, no problems with respiration noted  Abdomen: Soft, gravid, appropriate for gestational age.  Pain/Pressure: Present     Pelvic: Cervical exam deferred        Extremities: Normal range of motion.  Edema: None  Mental Status: Normal mood and affect. Normal behavior. Normal judgment and thought content.   Assessment and Plan:  Pregnancy: [id number] at [redacted]w[redacted]d 1. Supervision of high risk pregnancy,  antepartum --Anticipatory guidance about next visits/weeks of pregnancy given. --Pt reports seeing spots occasionally, had this with PEC in third trimester previous pregnancies so wanted to let us know.  Resolves easily, no headaches. --Increase PO fluids, eat/drink regularly   --BP 107/66 today. PEC precautions reviewed. --Pt to take BP weekly, enter into Babys[employer]s, email resent today. --Next visit in 4 weeks in office  2. Hyperemesis affecting pregnancy, antepartum --Improved, gaining weight now after weight loss in first trimester  3. Multiple sclerosis affecting pregnancy in second trimester ([place]) --stable, sees neurology at [facility].  4. [redacted] weeks gestation of pregnancy   Preterm labor symptoms and general obstetric precautions including but not limited to vaginal bleeding, contractions, leaking of fluid and fetal movement were reviewed in detail with the patient. Please refer to After Visit Summary for other counseling recommendations.   Return in about 4 weeks (around 05/06/2020).  Future Appointments  Date Time Provider [place]  04/26/2020  9:00 AM [facility] NURSE [facility]  04/26/2020  9:15 AM [facility] US2 [facility]  05/06/2020 10:35 AM [place], Kathie Dike, CNM [facility] None    Fatima Blank, CNM

## 2020-04-08 NOTE — Progress Notes (Addendum)
ROB needs refills on Zofran. C/o pressure, wants Maternity Belt.

## 2020-04-26 ENCOUNTER — Ambulatory Visit: Payer: Medicare HMO | Attending: Obstetrics and Gynecology

## 2020-04-26 ENCOUNTER — Ambulatory Visit: Payer: Medicare HMO | Admitting: *Deleted

## 2020-04-26 ENCOUNTER — Other Ambulatory Visit: Payer: Self-pay

## 2020-04-26 ENCOUNTER — Encounter: Payer: Self-pay | Admitting: *Deleted

## 2020-04-26 ENCOUNTER — Other Ambulatory Visit: Payer: Self-pay | Admitting: *Deleted

## 2020-04-26 DIAGNOSIS — O09212 Supervision of pregnancy with history of pre-term labor, second trimester: Secondary | ICD-10-CM

## 2020-04-26 DIAGNOSIS — O210 Mild hyperemesis gravidarum: Secondary | ICD-10-CM

## 2020-04-26 DIAGNOSIS — E669 Obesity, unspecified: Secondary | ICD-10-CM

## 2020-04-26 DIAGNOSIS — IMO0002 Reserved for concepts with insufficient information to code with codable children: Secondary | ICD-10-CM

## 2020-04-26 DIAGNOSIS — Z3480 Encounter for supervision of other normal pregnancy, unspecified trimester: Secondary | ICD-10-CM | POA: Diagnosis present

## 2020-04-26 DIAGNOSIS — O358XX0 Maternal care for other (suspected) fetal abnormality and damage, not applicable or unspecified: Secondary | ICD-10-CM | POA: Insufficient documentation

## 2020-04-26 DIAGNOSIS — Z6841 Body Mass Index (BMI) 40.0 and over, adult: Secondary | ICD-10-CM

## 2020-04-26 DIAGNOSIS — Z3A23 23 weeks gestation of pregnancy: Secondary | ICD-10-CM

## 2020-04-26 DIAGNOSIS — G35 Multiple sclerosis: Secondary | ICD-10-CM

## 2020-04-26 DIAGNOSIS — Z362 Encounter for other antenatal screening follow-up: Secondary | ICD-10-CM

## 2020-04-26 DIAGNOSIS — O99352 Diseases of the nervous system complicating pregnancy, second trimester: Secondary | ICD-10-CM

## 2020-04-26 DIAGNOSIS — O09522 Supervision of elderly multigravida, second trimester: Secondary | ICD-10-CM

## 2020-04-26 DIAGNOSIS — O99212 Obesity complicating pregnancy, second trimester: Secondary | ICD-10-CM

## 2020-05-06 ENCOUNTER — Other Ambulatory Visit: Payer: Self-pay

## 2020-05-06 ENCOUNTER — Ambulatory Visit (INDEPENDENT_AMBULATORY_CARE_PROVIDER_SITE_OTHER): Payer: Medicare HMO | Admitting: Advanced Practice Midwife

## 2020-05-06 VITALS — BP 103/66 | HR 72 | Wt 255.0 lb

## 2020-05-06 DIAGNOSIS — G35 Multiple sclerosis: Secondary | ICD-10-CM

## 2020-05-06 DIAGNOSIS — O99352 Diseases of the nervous system complicating pregnancy, second trimester: Secondary | ICD-10-CM

## 2020-05-06 DIAGNOSIS — Z3A24 24 weeks gestation of pregnancy: Secondary | ICD-10-CM

## 2020-05-06 DIAGNOSIS — O0990 Supervision of high risk pregnancy, unspecified, unspecified trimester: Secondary | ICD-10-CM

## 2020-05-06 NOTE — Progress Notes (Signed)
PRENATAL VISIT NOTE  Subjective:  [name] is a 34 y.o. [id number] at [redacted]w[redacted]d being seen today for ongoing prenatal care.  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  Patient reports no bleeding, no contractions, no cramping and no leaking.  Contractions: Irritability. Vag. Bleeding: None.  Movement: Present. Denies leaking of fluid.   The following portions of the patient's history were reviewed and updated as appropriate: allergies, current medications, past family history, past medical history, past social history, past surgical history and problem list.   Objective:   Vitals:   05/06/20 1110  BP: 103/66  Pulse: 72  Weight: 255 lb (115.7 kg)    Fetal Status: Fetal Heart Rate (bpm): 145 Fundal Height: 24 cm Movement: Present     General:  Alert, oriented and cooperative. Patient is in no acute distress.  Skin: Skin is warm and dry. No rash noted.   Cardiovascular: Normal heart rate noted  Respiratory: Normal respiratory effort, no problems with respiration noted  Abdomen: Soft, gravid, appropriate for gestational age.  Pain/Pressure: Absent     Pelvic: Cervical exam deferred        Extremities: Normal range of motion.  Edema: None  Mental Status: Normal mood and affect. Normal behavior. Normal judgment and thought content.   Assessment and Plan:  Pregnancy: [id number] at [redacted]w[redacted]d 1.  [redacted] weeks gestation of pregnancy --Anticipatory guidance for next visit - GTT 2hr   2. Supervision of high risk pregnancy, antepartum  3. Multiple sclerosis affecting pregnancy in second trimester [facility]) --Patient denies any recent flares from [place], advised next visit with MD for follow-up  Preterm labor symptoms and general obstetric precautions including but not limited to vaginal bleeding, contractions, leaking of fluid and fetal movement were reviewed in detail with the patient. Please refer to After Visit Summary for other counseling recommendations.   Return in about 4 weeks (around 06/03/2020).  Future Appointments  Date Time Provider [place]  05/24/2020  9:00 AM [facility] NURSE [facility]  05/24/2020  9:15 AM [facility] US2 [facility]  06/03/2020  8:00 AM [facility] LAB [facility] None  06/03/2020  9:30 AM Constant, Peggy, MD [facility] None    Fatima Blank, CNM   CNM attestation:  I have seen and examined this patient; I agree with above documentation in the midwife student's note.   [name] is a 34 y.o. [id number] in the [place] office for routine prenatal visit for high risk pregnancy. See problem list below. +FM, denies LOF, VB, contractions, vaginal discharge.  Patient Active Problem List   Diagnosis Date Noted  . Hyperemesis affecting pregnancy, antepartum 02/20/2020  . Supervision of other normal pregnancy, antepartum 02/20/2020  . History of reversal of tubal ligation 12/20/2019  . Adenomyosis 05/09/2019  . Elevated blood pressure reading 05/09/2019  . Desire for pregnancy 02/13/2019  . Anxiety 11/28/2018  . Endometriosis 05/23/2018  . Vaginal pain 05/09/2018  . Cholecystitis, acute with cholelithiasis 11/25/2017  . Fibroid of cervix   . Cervical mass 12/23/2016  . Child behavior problem 03/27/2016  . Malabsorption  08/29/2013  . 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 B12 deficiency 06/27/2013  . Insomnia 06/27/2013  . Adjustment  disorder with mixed anxiety and depressed mood 03/13/2013  . Multiple sclerosis ([place]) 03/13/2013  . Abdominal pain 07/29/2007  . OBESITY, NOS 08/12/2006     ROS, labs, PMH reviewed  PE: BP 103/66   Pulse 72   Wt 255 lb (115.7 kg)   LMP 11/17/2019   BMI 43.77 kg/m  Gen: calm comfortable, well appearing Resp: normal effort, no distress Abd: gravid appropriate for gestational age  Fundal height: 24 cm FHT by doppler: 145  Plan: - fetal kick counts reinforced, preterm labor precautions -    1. [redacted] weeks gestation of pregnancy   2. Supervision of high risk pregnancy, antepartum --Next visit with MD  3. Multiple sclerosis affecting pregnancy in second trimester ([place])     Fatima Blank, CNM 11:57 AM

## 2020-05-06 NOTE — Progress Notes (Signed)
ROB, reports no problems today. 

## 2020-05-06 NOTE — Patient Instructions (Signed)
Glucose Tolerance Test During Pregnancy Why am I having this test? The glucose tolerance test (GTT) is done to check how your body processes sugar (glucose). This is one of several tests used to diagnose diabetes that develops during pregnancy (gestational diabetes mellitus). Gestational diabetes is a temporary form of diabetes that some women develop during pregnancy. It usually occurs during the second trimester of pregnancy and goes away after delivery. Testing (screening) for gestational diabetes usually occurs between 24 and 28 weeks of pregnancy. You may have the GTT test after having a 1-hour glucose screening test if the results from that test indicate that you may have gestational diabetes. You may also have this test if:  You have a history of gestational diabetes.  You have a history of giving birth to very large babies or have experienced repeated fetal loss (stillbirth).  You have signs and symptoms of diabetes, such as: ? Changes in your vision. ? Tingling or numbness in your hands or feet. ? Changes in hunger, thirst, and urination that are not otherwise explained by your pregnancy. What is being tested? This test measures the amount of glucose in your blood at different times during a period of 3 hours. This indicates how well your body is able to process glucose. What kind of sample is taken?  Blood samples are required for this test. They are usually collected by inserting a needle into a blood vessel. How do I prepare for this test?  For 3 days before your test, eat normally. Have plenty of carbohydrate-rich foods.  Follow instructions from your health care provider about: ? Eating or drinking restrictions on the day of the test. You may be asked to not eat or drink anything other than water (fast) starting 8-10 hours before the test. ? Changing or stopping your regular medicines. Some medicines may interfere with this test. Tell a health care provider about:  All  medicines you are taking, including vitamins, herbs, eye drops, creams, and over-the-counter medicines.  Any blood disorders you have.  Any surgeries you have had.  Any medical conditions you have. What happens during the test? First, your blood glucose will be measured. This is referred to as your fasting blood glucose, since you fasted before the test. Then, you will drink a glucose solution that contains a certain amount of glucose. Your blood glucose will be measured again 1, 2, and 3 hours after drinking the solution. This test takes about 3 hours to complete. You will need to stay at the testing location during this time. During the testing period:  Do not eat or drink anything other than the glucose solution.  Do not exercise.  Do not use any products that contain nicotine or tobacco, such as cigarettes and e-cigarettes. If you need help stopping, ask your health care provider. The testing procedure may vary among health care providers and hospitals. How are the results reported? Your results will be reported as milligrams of glucose per deciliter of blood (mg/dL) or millimoles per liter (mmol/L). Your health care provider will compare your results to normal ranges that were established after testing a large group of people (reference ranges). Reference ranges may vary among labs and hospitals. For this test, common reference ranges are:  Fasting: less than 95-105 mg/dL (5.3-5.8 mmol/L).  1 hour after drinking glucose: less than 180-190 mg/dL (10.0-10.5 mmol/L).  2 hours after drinking glucose: less than 155-165 mg/dL (8.6-9.2 mmol/L).  3 hours after drinking glucose: 140-145 mg/dL (7.8-8.1 mmol/L). What do the   results mean? Results within reference ranges are considered normal, meaning that your glucose levels are well-controlled. If two or more of your blood glucose levels are high, you may be diagnosed with gestational diabetes. If only one level is high, your health care  provider may suggest repeat testing or other tests to confirm a diagnosis. Talk with your health care provider about what your results mean. Questions to ask your health care provider Ask your health care provider, or the department that is doing the test:  When will my results be ready?  How will I get my results?  What are my treatment options?  What other tests do I need?  What are my next steps? Summary  The glucose tolerance test (GTT) is one of several tests used to diagnose diabetes that develops during pregnancy (gestational diabetes mellitus). Gestational diabetes is a temporary form of diabetes that some women develop during pregnancy.  You may have the GTT test after having a 1-hour glucose screening test if the results from that test indicate that you may have gestational diabetes. You may also have this test if you have any symptoms or risk factors for gestational diabetes.  Talk with your health care provider about what your results mean. This information is not intended to replace advice given to you by your health care provider. Make sure you discuss any questions you have with your health care provider. Document Revised: 09/22/2018 Document Reviewed: 01/11/2017 Elsevier Patient Education  [place] of Pregnancy  The second trimester is from week 14 through week 27 (month 4 through 6). This is often the time in pregnancy that you feel your best. Often times, morning sickness has lessened or quit. You may have more energy, and you may get hungry more often. Your unborn baby is growing rapidly. At the end of the sixth month, he or she is about 9 inches long and weighs about 1 pounds. You will likely feel the baby move between 18 and 20 weeks of pregnancy. Follow these instructions at home: Medicines  Take over-the-counter and prescription medicines only as told by your doctor. Some medicines are safe and some medicines are not safe during  pregnancy.  Take a prenatal vitamin that contains at least 600 micrograms (mcg) of folic acid.  If you have trouble pooping (constipation), take medicine that will make your stool soft (stool softener) if your doctor approves. Eating and drinking   Eat regular, healthy meals.  Avoid raw meat and uncooked cheese.  If you get low calcium from the food you eat, talk to your doctor about taking a daily calcium supplement.  Avoid foods that are high in fat and sugars, such as fried and sweet foods.  If you feel sick to your stomach (nauseous) or throw up (vomit): ? Eat 4 or 5 small meals a day instead of 3 large meals. ? Try eating a few soda crackers. ? Drink liquids between meals instead of during meals.  To prevent constipation: ? Eat foods that are high in fiber, like fresh fruits and vegetables, whole grains, and beans. ? Drink enough fluids to keep your pee (urine) clear or pale yellow. Activity  Exercise only as told by your doctor. Stop exercising if you start to have cramps.  Do not exercise if it is too hot, too humid, or if you are in a place of great height (high altitude).  Avoid heavy lifting.  Wear low-heeled shoes. Sit and stand up straight.  You can continue  to have sex unless your doctor tells you not to. Relieving pain and discomfort  Wear a good support bra if your breasts are tender.  Take warm water baths (sitz baths) to soothe pain or discomfort caused by hemorrhoids. Use hemorrhoid cream if your doctor approves.  Rest with your legs raised if you have leg cramps or low back pain.  If you develop puffy, bulging veins (varicose veins) in your legs: ? Wear support hose or compression stockings as told by your doctor. ? Raise (elevate) your feet for 15 minutes, 3-4 times a day. ? Limit salt in your food. Prenatal care  Write down your questions. Take them to your prenatal visits.  Keep all your prenatal visits as told by your doctor. This is  important. Safety  Wear your seat belt when driving.  Make a list of emergency phone numbers, including numbers for family, friends, the hospital, and police and fire departments. General instructions  Ask your doctor about the right foods to eat or for help finding a counselor, if you need these services.  Ask your doctor about local prenatal classes. Begin classes before month 6 of your pregnancy.  Do not use hot tubs, steam rooms, or saunas.  Do not douche or use tampons or scented sanitary pads.  Do not cross your legs for long periods of time.  Visit your dentist if you have not done so. Use a soft toothbrush to brush your teeth. Floss gently.  Avoid all smoking, herbs, and alcohol. Avoid drugs that are not approved by your doctor.  Do not use any products that contain nicotine or tobacco, such as cigarettes and e-cigarettes. If you need help quitting, ask your doctor.  Avoid cat litter boxes and soil used by cats. These carry germs that can cause birth defects in the baby and can cause a loss of your baby (miscarriage) or stillbirth. Contact a doctor if:  You have mild cramps or pressure in your lower belly.  You have pain when you pee (urinate).  You have bad smelling fluid coming from your vagina.  You continue to feel sick to your stomach (nauseous), throw up (vomit), or have watery poop (diarrhea).  You have a nagging pain in your belly area.  You feel dizzy. Get help right away if:  You have a fever.  You are leaking fluid from your vagina.  You have spotting or bleeding from your vagina.  You have severe belly cramping or pain.  You lose or gain weight rapidly.  You have trouble catching your breath and have chest pain.  You notice sudden or extreme puffiness (swelling) of your face, hands, ankles, feet, or legs.  You have not felt the baby move in over an hour.  You have severe headaches that do not go away when you take medicine.  You have  trouble seeing. Summary  The second trimester is from week 14 through week 27 (months 4 through 6). This is often the time in pregnancy that you feel your best.  To take care of yourself and your unborn baby, you will need to eat healthy meals, take medicines only if your doctor tells you to do so, and do activities that are safe for you and your baby.  Call your doctor if you get sick or if you notice anything unusual about your pregnancy. Also, call your doctor if you need help with the right food to eat, or if you want to know what activities are safe for you. This  information is not intended to replace advice given to you by your health care provider. Make sure you discuss any questions you have with your health care provider. Document Revised: 09/23/2018 Document Reviewed: 07/07/2016 Elsevier Patient Education  [place].

## 2020-05-20 ENCOUNTER — Other Ambulatory Visit: Payer: Self-pay

## 2020-05-20 ENCOUNTER — Inpatient Hospital Stay (HOSPITAL_COMMUNITY)
Admission: AD | Admit: 2020-05-20 | Discharge: 2020-05-20 | Disposition: A | Discharge status: Home / Self Care | Payer: Medicare HMO | Attending: Family Medicine | Admitting: Family Medicine

## 2020-05-20 ENCOUNTER — Encounter (HOSPITAL_COMMUNITY): Payer: Self-pay | Admitting: Obstetrics and Gynecology

## 2020-05-20 DIAGNOSIS — R109 Unspecified abdominal pain: Secondary | ICD-10-CM | POA: Diagnosis not present

## 2020-05-20 DIAGNOSIS — Z3A00 Weeks of gestation of pregnancy not specified: Secondary | ICD-10-CM | POA: Diagnosis not present

## 2020-05-20 DIAGNOSIS — R519 Headache, unspecified: Secondary | ICD-10-CM

## 2020-05-20 DIAGNOSIS — O162 Unspecified maternal hypertension, second trimester: Secondary | ICD-10-CM | POA: Diagnosis not present

## 2020-05-20 DIAGNOSIS — G43009 Migraine without aura, not intractable, without status migrainosus: Secondary | ICD-10-CM | POA: Diagnosis not present

## 2020-05-20 DIAGNOSIS — R110 Nausea: Secondary | ICD-10-CM | POA: Insufficient documentation

## 2020-05-20 DIAGNOSIS — O99891 Other specified diseases and conditions complicating pregnancy: Secondary | ICD-10-CM

## 2020-05-20 DIAGNOSIS — Z3A27 27 weeks gestation of pregnancy: Secondary | ICD-10-CM | POA: Insufficient documentation

## 2020-05-20 DIAGNOSIS — O26892 Other specified pregnancy related conditions, second trimester: Secondary | ICD-10-CM | POA: Diagnosis not present

## 2020-05-20 LAB — URINALYSIS, ROUTINE W REFLEX MICROSCOPIC
Bacteria, UA: NONE SEEN
Bilirubin Urine: NEGATIVE
Glucose, UA: 50 mg/dL — AB
Hgb urine dipstick: NEGATIVE
Ketones, ur: 5 mg/dL — AB
Leukocytes,Ua: NEGATIVE
Nitrite: NEGATIVE
Protein, ur: 30 mg/dL — AB
Specific Gravity, Urine: 1.028 (ref 1.005–1.030)
pH: 5 (ref 5.0–8.0)

## 2020-05-20 LAB — CBC
HCT: 30.5 % — ABNORMAL LOW (ref 36.0–46.0)
Hemoglobin: 9.6 g/dL — ABNORMAL LOW (ref 12.0–15.0)
MCH: 30.7 pg (ref 26.0–34.0)
MCHC: 31.5 g/dL (ref 30.0–36.0)
MCV: 97.4 fL (ref 80.0–100.0)
Platelets: 400 10*3/uL (ref 150–400)
RBC: 3.13 MIL/uL — ABNORMAL LOW (ref 3.87–5.11)
RDW: 12.5 % (ref 11.5–15.5)
WBC: 9.7 10*3/uL (ref 4.0–10.5)
nRBC: 0 % (ref 0.0–0.2)

## 2020-05-20 LAB — COMPREHENSIVE METABOLIC PANEL
ALT: 9 U/L (ref 0–44)
AST: 13 U/L — ABNORMAL LOW (ref 15–41)
Albumin: 2.7 g/dL — ABNORMAL LOW (ref 3.5–5.0)
Alkaline Phosphatase: 46 U/L (ref 38–126)
Anion gap: 9 (ref 5–15)
BUN: 5 mg/dL — ABNORMAL LOW (ref 6–20)
CO2: 22 mmol/L (ref 22–32)
Calcium: 9 mg/dL (ref 8.9–10.3)
Chloride: 104 mmol/L (ref 98–111)
Creatinine, Ser: 0.83 mg/dL (ref 0.44–1.00)
GFR, Estimated: >60 mL/min (ref >60)
Glucose, Bld: 100 mg/dL — ABNORMAL HIGH (ref 70–99)
Potassium: 3.8 mmol/L (ref 3.5–5.1)
Sodium: 135 mmol/L (ref 135–145)
Total Bilirubin: 0.5 mg/dL (ref 0.3–1.2)
Total Protein: 6.3 g/dL — ABNORMAL LOW (ref 6.5–8.1)

## 2020-05-20 LAB — PROTEIN / CREATININE RATIO, URINE
Creatinine, Urine: 387.45 mg/dL
Protein Creatinine Ratio: 0.05 mg/mg{Cre} (ref 0.00–0.15)
Total Protein, Urine: 21 mg/dL

## 2020-05-20 MED ORDER — LACTATED RINGERS IV BOLUS
1000.0000 mL | Freq: Once | INTRAVENOUS | Status: AC
  Administered 2020-05-20: 1000 mL via INTRAVENOUS

## 2020-05-20 MED ORDER — METOCLOPRAMIDE HCL 5 MG/ML IJ SOLN
10.0000 mg | Freq: Once | INTRAMUSCULAR | Status: AC
  Administered 2020-05-20: 10 mg via INTRAVENOUS
  Filled 2020-05-20: qty 2

## 2020-05-20 MED ORDER — DEXAMETHASONE SODIUM PHOSPHATE 10 MG/ML IJ SOLN
10.0000 mg | Freq: Once | INTRAMUSCULAR | Status: AC
  Administered 2020-05-20: 10 mg via INTRAVENOUS
  Filled 2020-05-20: qty 1

## 2020-05-20 MED ORDER — CYCLOBENZAPRINE HCL 10 MG PO TABS: 10.0000 mg | ORAL_TABLET | Freq: Two times a day (BID) | ORAL | 0 refills | Status: DC | PRN

## 2020-05-20 MED ORDER — DIPHENHYDRAMINE HCL 50 MG/ML IJ SOLN
25.0000 mg | Freq: Once | INTRAMUSCULAR | Status: AC
  Administered 2020-05-20: 25 mg via INTRAVENOUS
  Filled 2020-05-20: qty 1

## 2020-05-20 NOTE — MAU Note (Signed)
Been waking up with headaches the last 3 days, has not taken anything for it.  Doesn't go away.  Seeing 'glare' spots.  Also having some abd cramping. Hx of pre-eclampsia with every pregnancy. Denies epigastric pain or swelling.

## 2020-05-20 NOTE — MAU Provider Note (Addendum)
History     CSN: [id number]  Arrival date and time: 05/20/20 1444    Chief Complaint  Patient presents with  . Abdominal Pain  . Headache   [name] is a 34 yo [phone] presenting today with a 3 day history of constant cramping and headaches. She also says she is "seeing glares" in her eyes. Patient endorses nausea and vomiting yesterday and the day before which consisted of food contents. Today she is only feeling nauseous. Patient is able to keep down PO liquids. Patient has a history of preeclampsia with every previous pregnancy. Patient reports + FM. Patient denies any abdominal pain, shortness of breath, chest pain, edema in the extremities, vaginal bleeding/discharge.   OB History    Gravida  7   Para  3   Term  0   Preterm  3   AB  3   Living  4     SAB      TAB  3   Ectopic      Multiple  1   Live Births  4        Obstetric Comments  Induced early due to Pre- E with all prenancies        Past Medical History:  Diagnosis Date  . Abnormal Pap smear    Colpo>normal  . Fibroid   . Gonorrhea   . Headache    last migraine 01/2017  . History of IBS    watch diet, no meds  . Infection    UTI  . Multiple sclerosis (HCC)   . Neuromuscular disorder [facility])    diagnosed with MS this visit  . Obesity   . Ovarian cyst   . Pernicious anemia 05/02/2013  . Pregnancy induced hypertension   . Preterm labor    all 3 deliveries  . SVD (spontaneous vaginal delivery)    x 3  . Urinary tract infection   . Vaginal Pap smear, abnormal     Past Surgical History:  Procedure Laterality Date  . CHOLECYSTECTOMY N/A 11/25/2017   Procedure: LAPAROSCOPIC CHOLECYSTECTOMY WITH INTRAOPERATIVE CHOLANGIOGRAM;  Surgeon: Darnell Level, MD;  Location: [facility]S;  Service: General;  Laterality: N/A;  . COLPOSCOPY    . MYOMECTOMY N/A 04/01/2017   Procedure: Vaginal Myomectomy;  Surgeon: Tereso Newcomer, MD;  Location: [facility];  Service: Gynecology;  Laterality: N/A;  .  TUBAL LIGATION     2012/ reversal June 2020  . WISDOM TOOTH EXTRACTION      Family History  Problem Relation Age of Onset  . Diabetes Mother   . Hypertension Mother   . Diabetes Father   . Hypertension Father   . Kidney disease Father   . Stroke Father   . Learning disabilities Brother   . Anesthesia problems Neg Hx   . Other Neg Hx     Social History   Tobacco Use  . Smoking status: Never Smoker  . Smokeless tobacco: Never Used  Vaping Use  . Vaping Use: Never used  Substance Use Topics  . Alcohol use: No    Alcohol/week: 0.0 standard drinks    Comment:    . Drug use: No    Comment: denies use, states was given CBD gummies for nausea    Allergies: No Known Allergies  Medications Prior to Admission  Medication Sig Dispense Refill Last Dose  . promethazine (PHENERGAN) 12.5 MG tablet Take 1-2 tablets (12.5-25 mg total) by mouth every 6 (six) hours as needed for nausea or vomiting. 30  tablet 2 05/19/2020 at Unknown time  . acetaminophen (TYLENOL) 325 MG tablet Take 650 mg by mouth every 6 (six) hours as needed.     . Blood Pressure Monitoring (BLOOD PRESSURE KIT) DEVI 1 kit by Does not apply route once a week. Check Blood Pressure regularly and record readings into the [employer] App.  Large Cuff.  DX O90.0 1 each 0   . docusate sodium (COLACE) 100 MG capsule Take 1 capsule (100 mg total) by mouth 2 (two) times daily as needed for mild constipation. (Patient not taking: Reported on 02/26/2020) 30 capsule 2  at not taking  . Elastic Bandages & Supports (COMFORT FIT MATERNITY SUPP MED) MISC 1 Device by Does not apply route daily. 1 each 0   . metoCLOPramide (REGLAN) 10 MG tablet Take 1 tablet (10 mg total) by mouth 3 (three) times daily with meals. 90 tablet 0  at not taking  . ondansetron (ZOFRAN) 4 MG tablet Take 1 tablet (4 mg total) by mouth every 8 (eight) hours as needed for nausea or vomiting. 20 tablet 5  at not taking  . prochlorperazine (COMPAZINE) 10 MG tablet Take 1  tablet (10 mg total) by mouth 2 (two) times daily as needed for nausea or vomiting. (Patient not taking: Reported on 02/20/2020) 30 tablet 0  at not taking  . scopolamine (TRANSDERM-SCOP) 1 MG/3DAYS Place 1 patch (1.5 mg total) onto the skin every 3 (three) days. 10 patch 12  at not taking    Review of Systems  HENT: Negative for facial swelling.   Eyes: Positive for visual disturbance.  Respiratory: Negative for chest tightness and shortness of breath.   Cardiovascular: Negative for chest pain and leg swelling.  Gastrointestinal: Positive for nausea and vomiting.  Genitourinary: Negative for vaginal bleeding and vaginal discharge.  Neurological: Positive for headaches.   Physical Exam   Blood pressure 126/77, pulse 91, temperature 98.7 F (37.1 C), temperature source Oral, resp. rate 18, height $RemoveBe'5\' 4"'FrOsFJtKO$  (1.626 m), weight 117.3 kg, last menstrual period 11/17/2019, SpO2 99 %, unknown if currently breastfeeding.  Physical Exam Constitutional:      General: She is not in acute distress.    Appearance: She is obese.  Cardiovascular:     Rate and Rhythm: Tachycardia present.  Pulmonary:     Effort: Pulmonary effort is normal.  Abdominal:     General: Abdomen is protuberant.     Palpations: Abdomen is soft.     Tenderness: There is abdominal tenderness in the periumbilical area. There is no guarding or rebound.     Hernia: No hernia is present.  Skin:    General: Skin is warm and dry.     Coloration: Skin is not jaundiced or pale.  Neurological:     Mental Status: She is alert and oriented to person, place, and time.  Psychiatric:        Mood and Affect: Mood normal.        Behavior: Behavior normal.     MAU Course  Procedures  Orders Placed This Encounter  Procedures  . Urinalysis, Routine w reflex microscopic Urine, Clean Catch    Standing Status:   Standing    Number of Occurrences:   1  . CBC    Standing Status:   Standing    Number of Occurrences:   1  . Comprehensive  metabolic panel    Standing Status:   Standing    Number of Occurrences:   1  . Protein / creatinine  ratio, urine    Standing Status:   Standing    Number of Occurrences:   1  . Measure blood pressure    Standing Status:   Standing    Number of Occurrences:   1  . Insert peripheral IV    Standing Status:   Standing    Number of Occurrences:   1   Meds ordered this encounter  Medications  . AND Linked Order Group   . diphenhydrAMINE (BENADRYL) injection 25 mg   . metoCLOPramide (REGLAN) injection 10 mg   . dexamethasone (DECADRON) injection 10 mg   . lactated ringers bolus 1,000 mL   Results for orders placed or performed during the hospital encounter of 05/20/20 (from the past 24 hour(s))  Urinalysis, Routine w reflex microscopic Urine, Clean Catch     Status: Abnormal   Collection Time: 05/20/20  3:41 PM  Result Value Ref Range   Color, Urine YELLOW YELLOW   APPearance HAZY (A) CLEAR   Specific Gravity, Urine 1.028 1.005 - 1.030   pH 5.0 5.0 - 8.0   Glucose, UA 50 (A) NEGATIVE mg/dL   Hgb urine dipstick NEGATIVE NEGATIVE   Bilirubin Urine NEGATIVE NEGATIVE   Ketones, ur 5 (A) NEGATIVE mg/dL   Protein, ur 30 (A) NEGATIVE mg/dL   Nitrite NEGATIVE NEGATIVE   Leukocytes,Ua NEGATIVE NEGATIVE   RBC / HPF 6-10 0 - 5 RBC/hpf   WBC, UA 0-5 0 - 5 WBC/hpf   Bacteria, UA NONE SEEN NONE SEEN   Squamous Epithelial / LPF 6-10 0 - 5   Mucus PRESENT   Protein / creatinine ratio, urine     Status: None   Collection Time: 05/20/20  4:06 PM  Result Value Ref Range   Creatinine, Urine 387.45 mg/dL   Total Protein, Urine 21 mg/dL   Protein Creatinine Ratio 0.05 0.00 - 0.15 mg/mg[Cre]  CBC     Status: Abnormal   Collection Time: 05/20/20  4:12 PM  Result Value Ref Range   WBC 9.7 4.0 - 10.5 K/uL   RBC 3.13 (L) 3.87 - 5.11 MIL/uL   Hemoglobin 9.6 (L) 12.0 - 15.0 g/dL   HCT 22.8 (L) 36 - 46 %   MCV 97.4 80.0 - 100.0 fL   MCH 30.7 26.0 - 34.0 pg   MCHC 31.5 30.0 - 36.0 g/dL   RDW 40.6  98.6 - 14.8 %   Platelets 400 150 - 400 K/uL   nRBC 0.0 0.0 - 0.2 %  Comprehensive metabolic panel     Status: Abnormal   Collection Time: 05/20/20  4:12 PM  Result Value Ref Range   Sodium 135 135 - 145 mmol/L   Potassium 3.8 3.5 - 5.1 mmol/L   Chloride 104 98 - 111 mmol/L   CO2 22 22 - 32 mmol/L   Glucose, Bld 100 (H) 70 - 99 mg/dL   BUN 5 (L) 6 - 20 mg/dL   Creatinine, Ser 3.07 0.44 - 1.00 mg/dL   Calcium 9.0 8.9 - 35.4 mg/dL   Total Protein 6.3 (L) 6.5 - 8.1 g/dL   Albumin 2.7 (L) 3.5 - 5.0 g/dL   AST 13 (L) 15 - 41 U/L   ALT 9 0 - 44 U/L   Alkaline Phosphatase 46 38 - 126 U/L   Total Bilirubin 0.5 0.3 - 1.2 mg/dL   GFR, Estimated >30 >14 mL/min   Anion gap 9 5 - 15    Patient Vitals for the past 24 hrs:  BP Temp Temp  src Pulse Resp SpO2 Height Weight  05/20/20 1631 126/77 -- -- 91 -- -- -- --  05/20/20 1630 -- -- -- -- -- 99 % -- --  05/20/20 1625 -- -- -- -- -- 99 % -- --  05/20/20 1620 -- -- -- -- -- 98 % -- --  05/20/20 1616 130/67 -- -- 89 -- -- -- --  05/20/20 1601 (!) 141/71 -- -- (!) 104 -- -- -- --  05/20/20 1556 -- -- -- -- -- 99 % -- --  05/20/20 1555 -- -- -- -- -- 99 % -- --  05/20/20 1550 -- -- -- -- -- 99 % -- --  05/20/20 1546 (!) 145/78 -- -- (!) 105 -- -- -- --  05/20/20 1545 -- -- -- -- -- 99 % -- --  05/20/20 1541 139/80 -- -- (!) 106 -- -- -- --  05/20/20 1540 -- -- -- -- -- 99 % -- --  05/20/20 1522 (!) 155/85 98.7 F (37.1 C) Oral (!) 118 18 98 % $Re'5\' 4"'CGw$  (1.626 m) 117.3 kg    MDM Alijah Hyde is a 34 yo [id number] presenting today with a 3 day history of cramping, headaches, N/V, and vision changes. Patient has a history of preeclampsia with every previous pregnancy. Rule out preeclampsia with routine workup. Start patient on routine headache protocol with Benadryl 25 mg IV, Reglan 10 mg IV, Dexamethasone 10 mg IV, and LR bolus 1000 mg IV.    Assessment and Plan  1) Headache - Patient started on routine headache protocol with Benadryl 25 mg IV,  Reglan 10 mg IV, Dexamethasone 10 mg IV, and LR bolus 1000 mg IV.    Patient is ready for discharge. Follow up with OB with upcoming appointment on Friday. Office notified of elevated BP during MAU visit today.  Patient educated on symptoms rendering further evaluation at MAU.   Laurena Spies PA-S 05/20/2020, 6:00 PM   Attestation of Supervision of Student:  I confirm that I have verified the information documented in the physician assistant student's note and that I have also personally reperformed the history, physical exam and all medical decision making activities.  I have verified that all services and findings are accurately documented in this student's note; and I agree with management and plan as outlined in the documentation. I have also made any necessary editorial changes.  [name] is a 34 y.o. year old [phone] female at [redacted]w[redacted]d weeks gestation who presents to MAU reporting waking up with a H/A for 3 days and some abdominal cramping. She has tried Tylenol with no relief. She reports seeing "glare" spots in her vision and reports "feeling like she needs to pinch her nose because it feels like her nose is about to start bleeding." She report a h/o PEC with every pregnancy. She denies epigastric pain or swelling. She reports N/V yesterday and Saturday; nausea only able to keep down liquids.  Results for orders placed or performed during the hospital encounter of 05/20/20 (from the past 24 hour(s))  Urinalysis, Routine w reflex microscopic Urine, Clean Catch     Status: Abnormal   Collection Time: 05/20/20  3:41 PM  Result Value Ref Range   Color, Urine YELLOW YELLOW   APPearance HAZY (A) CLEAR   Specific Gravity, Urine 1.028 1.005 - 1.030   pH 5.0 5.0 - 8.0   Glucose, UA 50 (A) NEGATIVE mg/dL   Hgb urine dipstick NEGATIVE NEGATIVE   Bilirubin Urine NEGATIVE NEGATIVE  Ketones, ur 5 (A) NEGATIVE mg/dL   Protein, ur 30 (A) NEGATIVE mg/dL   Nitrite NEGATIVE NEGATIVE    Leukocytes,Ua NEGATIVE NEGATIVE   RBC / HPF 6-10 0 - 5 RBC/hpf   WBC, UA 0-5 0 - 5 WBC/hpf   Bacteria, UA NONE SEEN NONE SEEN   Squamous Epithelial / LPF 6-10 0 - 5   Mucus PRESENT   Protein / creatinine ratio, urine     Status: None   Collection Time: 05/20/20  4:06 PM  Result Value Ref Range   Creatinine, Urine 387.45 mg/dL   Total Protein, Urine 21 mg/dL   Protein Creatinine Ratio 0.05 0.00 - 0.15 mg/mg[Cre]  CBC     Status: Abnormal   Collection Time: 05/20/20  4:12 PM  Result Value Ref Range   WBC 9.7 4.0 - 10.5 K/uL   RBC 3.13 (L) 3.87 - 5.11 MIL/uL   Hemoglobin 9.6 (L) 12.0 - 15.0 g/dL   HCT 30.5 (L) 36 - 46 %   MCV 97.4 80.0 - 100.0 fL   MCH 30.7 26.0 - 34.0 pg   MCHC 31.5 30.0 - 36.0 g/dL   RDW 12.5 11.5 - 15.5 %   Platelets 400 150 - 400 K/uL   nRBC 0.0 0.0 - 0.2 %  Comprehensive metabolic panel     Status: Abnormal   Collection Time: 05/20/20  4:12 PM  Result Value Ref Range   Sodium 135 135 - 145 mmol/L   Potassium 3.8 3.5 - 5.1 mmol/L   Chloride 104 98 - 111 mmol/L   CO2 22 22 - 32 mmol/L   Glucose, Bld 100 (H) 70 - 99 mg/dL   BUN 5 (L) 6 - 20 mg/dL   Creatinine, Ser 0.83 0.44 - 1.00 mg/dL   Calcium 9.0 8.9 - 10.3 mg/dL   Total Protein 6.3 (L) 6.5 - 8.1 g/dL   Albumin 2.7 (L) 3.5 - 5.0 g/dL   AST 13 (L) 15 - 41 U/L   ALT 9 0 - 44 U/L   Alkaline Phosphatase 46 38 - 126 U/L   Total Bilirubin 0.5 0.3 - 1.2 mg/dL   GFR, Estimated >60 >60 mL/min   Anion gap 9 5 - 15   Patient reported improvement of H/A after receiving IV meds -- IVF bolus did not infuse and patient not willing to wait any longer for infusion to complete. Patient d/c'd home without incident. Message sent to [facility] to have patient seen for BP check by the end of the week.  Laury Deep, [place] for [employer], [employer] 05/20/2020 9:09 PM

## 2020-05-20 NOTE — Discharge Instructions (Signed)
Please take your blood pressure everyday. Call the office if your blood pressures are >140/>90.

## 2020-05-23 ENCOUNTER — Other Ambulatory Visit: Payer: Self-pay

## 2020-05-23 ENCOUNTER — Encounter: Payer: Self-pay | Admitting: Obstetrics and Gynecology

## 2020-05-23 ENCOUNTER — Ambulatory Visit (INDEPENDENT_AMBULATORY_CARE_PROVIDER_SITE_OTHER): Payer: Medicare HMO | Admitting: Obstetrics and Gynecology

## 2020-05-23 ENCOUNTER — Ambulatory Visit: Payer: Medicare HMO

## 2020-05-23 VITALS — BP 128/71 | HR 83

## 2020-05-23 VITALS — BP 128/71 | HR 83 | Wt 257.0 lb

## 2020-05-23 DIAGNOSIS — Z3480 Encounter for supervision of other normal pregnancy, unspecified trimester: Secondary | ICD-10-CM

## 2020-05-23 DIAGNOSIS — O09299 Supervision of pregnancy with other poor reproductive or obstetric history, unspecified trimester: Secondary | ICD-10-CM | POA: Insufficient documentation

## 2020-05-23 DIAGNOSIS — Z0130 Encounter for examination of blood pressure without abnormal findings: Secondary | ICD-10-CM

## 2020-05-23 MED ORDER — BUTALBITAL-APAP-CAFFEINE 50-325-40 MG PO CAPS: 1.0000 | ORAL_CAPSULE | Freq: Four times a day (QID) | ORAL | 0 refills | Status: DC | PRN

## 2020-05-23 NOTE — Progress Notes (Signed)
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 Hx of Pre eclampsia.  Hypertension ROS: C/o headaches 8/10 and floaters x 7 days, taking medications as instructed, no medication side effects noted, no TIA's, no chest pain on exertion, no dyspnea on exertion and no swelling of ankles.    Objective:  BP 128/71   Pulse 83   Wt 257 lb (116.6 kg)   LMP 11/17/2019   BMI 44.11 kg/m   Appearance alert, well appearing, and in no distress. General exam BP noted to be well controlled today in office.    Assessment:   C/o headaches 8/10 and floaters x 7 days, taking tylenol but it is not working.  Home BP readings 158/90 and 121/90.  Blood Pressure stable.

## 2020-05-23 NOTE — Progress Notes (Signed)
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 Hx of Pre eclampsia.  Hypertension ROS: C/o headaches 8/10 and floaters x 7 days, taking medications as instructed, no medication side effects noted, no TIA's, no chest pain on exertion, no dyspnea on exertion and no swelling of ankles.    Objective:  BP 128/71   Pulse 83   Wt 257 lb (116.6 kg)   LMP 11/17/2019   BMI 44.11 kg/m    Appearance alert, well appearing, and in no distress. General exam BP noted to be well controlled today in office.    Assessment:   Blood Pressure stable. C/o headaches 8/10 and floaters x 7 days. Taking tylenol and it is not working. Home BP readings 158/90 and 121/90  Plan:  Patient will see a MD today.Marland Kitchen

## 2020-05-23 NOTE — Progress Notes (Signed)
Agree with A & P. 

## 2020-05-23 NOTE — Progress Notes (Signed)
Subjective:  [name] is a 34 y.o. [phone] at 56w6dbeing seen today for  As work in for HA and elevated BP.  She is currently monitored for the following issues for this high-risk pregnancy and has OBESITY, NOS; Abdominal pain; Adjustment disorder with mixed anxiety and depressed mood; [place]; [place]; B12 deficiency; Child behavior problem; Cervical mass; Insomnia; Malabsorption; Fibroid of cervix; Endometriosis; Anxiety; Desire for pregnancy; Adenomyosis; Elevated blood pressure reading; History of reversal of tubal ligation; Hyperemesis affecting pregnancy, antepartum; Supervision of other normal pregnancy, antepartum; and Hx of preeclampsia, prior pregnancy, currently pregnant on their problem list.  Patient reports HA in the morning, then resolves but has floaters through the day. .  Contractions: Not present. Vag. Bleeding: None.  Movement: Present. Denies leaking of fluid.   The following portions of the patient's history were reviewed and updated as appropriate: allergies, current medications, past family history, past medical history, past social history, past surgical history and problem list. Problem list updated.  Objective:   Vitals:   05/23/20 1124  BP: 128/71  Pulse: 83    Fetal Status: Fetal Heart Rate (bpm): 140   Movement: Present     General:  Alert, oriented and cooperative. Patient is in no acute distress.  Skin: Skin is warm and dry. No rash noted.   Cardiovascular: Normal heart rate noted  Respiratory: Normal respiratory effort, no problems with respiration noted  Abdomen: Soft, gravid, appropriate for gestational age. Pain/Pressure: Absent     Pelvic:  Cervical exam deferred        Extremities: Normal range of motion.  Edema: None  Mental Status: Normal mood and affect. Normal behavior. Normal judgment and thought content.   Urinalysis:      Assessment and Plan:  Pregnancy: [id number] 260w6d1.  Supervision of other normal pregnancy, antepartum  - Butalbital-APAP-Caffeine 50-325-40 MG capsule; Take 1 capsule by mouth every 6 (six) hours as needed for headache.  Dispense: 30 capsule; Refill: 0  2. Hx of preeclampsia, prior pregnancy, currently pregnant Was seen in MAU on 05/20/20, W/U negative and given HA cocktail, HA resolved. Continue to monitor BP at home  - CBC - Comp Met (CMET) - Protein / creatinine ratio, urine  Preterm labor symptoms and general obstetric precautions including but not limited to vaginal bleeding, contractions, leaking of fluid and fetal movement were reviewed in detail with the patient. Please refer to After Visit Summary for other counseling recommendations.  Return in about 1 week (around 05/30/2020) for OB visit, face to face, MD only, fasting for Glucola.   ErChancy MilroyMD

## 2020-05-24 ENCOUNTER — Ambulatory Visit: Payer: Medicare HMO | Attending: Obstetrics and Gynecology

## 2020-05-24 ENCOUNTER — Ambulatory Visit: Payer: Medicare HMO | Admitting: *Deleted

## 2020-05-24 ENCOUNTER — Other Ambulatory Visit: Payer: Self-pay | Admitting: *Deleted

## 2020-05-24 ENCOUNTER — Encounter: Payer: Self-pay | Admitting: *Deleted

## 2020-05-24 DIAGNOSIS — O99352 Diseases of the nervous system complicating pregnancy, second trimester: Secondary | ICD-10-CM | POA: Diagnosis not present

## 2020-05-24 DIAGNOSIS — G35 Multiple sclerosis: Secondary | ICD-10-CM

## 2020-05-24 DIAGNOSIS — O99212 Obesity complicating pregnancy, second trimester: Secondary | ICD-10-CM | POA: Insufficient documentation

## 2020-05-24 DIAGNOSIS — IMO0002 Reserved for concepts with insufficient information to code with codable children: Secondary | ICD-10-CM

## 2020-05-24 DIAGNOSIS — O210 Mild hyperemesis gravidarum: Secondary | ICD-10-CM

## 2020-05-24 DIAGNOSIS — O358XX0 Maternal care for other (suspected) fetal abnormality and damage, not applicable or unspecified: Secondary | ICD-10-CM

## 2020-05-24 DIAGNOSIS — O09522 Supervision of elderly multigravida, second trimester: Secondary | ICD-10-CM

## 2020-05-24 DIAGNOSIS — Z3480 Encounter for supervision of other normal pregnancy, unspecified trimester: Secondary | ICD-10-CM

## 2020-05-24 DIAGNOSIS — Z3A27 27 weeks gestation of pregnancy: Secondary | ICD-10-CM | POA: Insufficient documentation

## 2020-05-24 DIAGNOSIS — Z6841 Body Mass Index (BMI) 40.0 and over, adult: Secondary | ICD-10-CM

## 2020-05-24 DIAGNOSIS — E669 Obesity, unspecified: Secondary | ICD-10-CM

## 2020-05-24 DIAGNOSIS — Z362 Encounter for other antenatal screening follow-up: Secondary | ICD-10-CM | POA: Insufficient documentation

## 2020-05-24 LAB — PROTEIN / CREATININE RATIO, URINE
Creatinine, Urine: 155.7 mg/dL
Protein, Ur: 25.4 mg/dL
Protein/Creat Ratio: 163 mg/g creat (ref 0–200)

## 2020-05-24 LAB — COMPREHENSIVE METABOLIC PANEL
ALT: 5 IU/L (ref 0–32)
AST: 9 IU/L (ref 0–40)
Albumin/Globulin Ratio: 1.1 — ABNORMAL LOW (ref 1.2–2.2)
Albumin: 3.4 g/dL — ABNORMAL LOW (ref 3.8–4.8)
Alkaline Phosphatase: 57 IU/L (ref 44–121)
BUN/Creatinine Ratio: 7 — ABNORMAL LOW (ref 9–23)
BUN: 5 mg/dL — ABNORMAL LOW (ref 6–20)
Bilirubin Total: 0.3 mg/dL (ref 0.0–1.2)
CO2: 23 mmol/L (ref 20–29)
Calcium: 8.8 mg/dL (ref 8.7–10.2)
Chloride: 101 mmol/L (ref 96–106)
Creatinine, Ser: 0.68 mg/dL (ref 0.57–1.00)
GFR calc Af Amer: 130 mL/min/{1.73_m2} (ref >59)
GFR calc non Af Amer: 113 mL/min/{1.73_m2} (ref >59)
Globulin, Total: 3 g/dL (ref 1.5–4.5)
Glucose: 81 mg/dL (ref 65–99)
Potassium: 4.4 mmol/L (ref 3.5–5.2)
Sodium: 135 mmol/L (ref 134–144)
Total Protein: 6.4 g/dL (ref 6.0–8.5)

## 2020-05-24 LAB — CBC
Hematocrit: 30.6 % — ABNORMAL LOW (ref 34.0–46.6)
Hemoglobin: 9.7 g/dL — ABNORMAL LOW (ref 11.1–15.9)
MCH: 30.1 pg (ref 26.6–33.0)
MCHC: 31.7 g/dL (ref 31.5–35.7)
MCV: 95 fL (ref 79–97)
Platelets: 430 10*3/uL (ref 150–450)
RBC: 3.22 x10E6/uL — ABNORMAL LOW (ref 3.77–5.28)
RDW: 12.1 % (ref 11.7–15.4)
WBC: 12.7 10*3/uL — ABNORMAL HIGH (ref 3.4–10.8)

## 2020-06-03 ENCOUNTER — Encounter: Payer: Self-pay | Admitting: Obstetrics and Gynecology

## 2020-06-03 ENCOUNTER — Other Ambulatory Visit: Payer: Self-pay

## 2020-06-03 ENCOUNTER — Ambulatory Visit (INDEPENDENT_AMBULATORY_CARE_PROVIDER_SITE_OTHER): Payer: Medicare HMO | Admitting: Obstetrics and Gynecology

## 2020-06-03 ENCOUNTER — Other Ambulatory Visit: Payer: Medicare HMO

## 2020-06-03 VITALS — BP 104/67 | HR 86 | Wt 258.0 lb

## 2020-06-03 DIAGNOSIS — Z23 Encounter for immunization: Secondary | ICD-10-CM

## 2020-06-03 DIAGNOSIS — Z3480 Encounter for supervision of other normal pregnancy, unspecified trimester: Secondary | ICD-10-CM

## 2020-06-03 DIAGNOSIS — O09299 Supervision of pregnancy with other poor reproductive or obstetric history, unspecified trimester: Secondary | ICD-10-CM

## 2020-06-03 MED ORDER — ASPIRIN EC 81 MG PO TBEC: 81.0000 mg | DELAYED_RELEASE_TABLET | Freq: Every day | ORAL | 2 refills | Status: DC

## 2020-06-03 NOTE — Progress Notes (Signed)
   PRENATAL VISIT NOTE  Subjective:  [name] is a 34 y.o. [id number] at [redacted]w[redacted]d being seen today for ongoing prenatal care.  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  Patient reports no complaints.  Contractions: Not present. Vag. Bleeding: None.  Movement: Present. Denies leaking of fluid.   The following portions of the patient's history were reviewed and updated as appropriate: allergies, current medications, past family history, past medical history, past social history, past surgical history and problem list.   Objective:   Vitals:   06/03/20 0818  BP: 104/67  Pulse: 86  Weight: 258 lb (117 kg)    Fetal Status: Fetal Heart Rate (bpm): 160 Fundal Height: 29 cm Movement: Present     General:  Alert, oriented and cooperative. Patient is in no acute distress.  Skin: Skin is warm and dry. No rash noted.   Cardiovascular: Normal heart rate noted  Respiratory: Normal respiratory effort, no problems with respiration noted  Abdomen: Soft, gravid, appropriate for gestational age.  Pain/Pressure: Present     Pelvic: Cervical exam deferred        Extremities: Normal range of motion.  Edema: None  Mental Status: Normal mood and affect. Normal behavior. Normal judgment and thought content.   Assessment and Plan:  Pregnancy: L3T3428 at [redacted]w[redacted]d 1. Supervision of other normal pregnancy,  antepartum Patient is doing well without complaints.  Third trimester labs and glucola today Tdap today Patient is considering vasectomy for contraception - Glucose Tolerance, 2 Hours w/1 Hour - RPR - HIV Antibody (routine testing w rflx) - CBC  2. Need for Tdap vaccination - Tdap vaccine greater than or equal to 7yo IM  3. Hx of preeclampsia, prior pregnancy, currently pregnant Rx ASA provided Normotensive and asymptomatic  Preterm labor symptoms and general obstetric precautions including but not limited to vaginal bleeding, contractions, leaking of fluid and fetal movement were reviewed in detail with the patient. Please refer to After Visit Summary for other counseling recommendations.   No follow-ups on file.  Future Appointments  Date Time Provider [place]  06/03/2020  9:30 AM Louna Rothgeb, Vickii Chafe, MD [facility] None  06/21/2020 10:15 AM [facility] NURSE [facility]  06/21/2020 10:30 AM [facility] US3 [facility]    Mora Bellman, MD

## 2020-06-03 NOTE — Progress Notes (Signed)
ROB   2 hr GTT today  Tdap :will get vaccine today   CC: HA's , visual changes seeing spots, no dizziness, no swelling.  Pt takes Tylenol no relief, pt notes increasing water intake.

## 2020-06-04 LAB — CBC
Hematocrit: 29.2 % — ABNORMAL LOW (ref 34.0–46.6)
Hemoglobin: 9.4 g/dL — ABNORMAL LOW (ref 11.1–15.9)
MCH: 29.7 pg (ref 26.6–33.0)
MCHC: 32.2 g/dL (ref 31.5–35.7)
MCV: 92 fL (ref 79–97)
Platelets: 371 10*3/uL (ref 150–450)
RBC: 3.16 x10E6/uL — ABNORMAL LOW (ref 3.77–5.28)
RDW: 12 % (ref 11.7–15.4)
WBC: 9.7 10*3/uL (ref 3.4–10.8)

## 2020-06-04 LAB — HIV ANTIBODY (ROUTINE TESTING W REFLEX): HIV Screen 4th Generation wRfx: NONREACTIVE

## 2020-06-04 LAB — RPR: RPR Ser Ql: NONREACTIVE

## 2020-06-04 LAB — GLUCOSE TOLERANCE, 2 HOURS W/ 1HR
Glucose, 1 hour: 168 mg/dL (ref 65–179)
Glucose, 2 hour: 123 mg/dL (ref 65–152)
Glucose, Fasting: 86 mg/dL (ref 65–91)

## 2020-06-06 ENCOUNTER — Other Ambulatory Visit: Payer: Self-pay

## 2020-06-06 ENCOUNTER — Inpatient Hospital Stay (HOSPITAL_COMMUNITY)
Admission: AD | Admit: 2020-06-06 | Discharge: 2020-06-06 | Disposition: A | Discharge status: Home / Self Care | Payer: Medicare HMO | Attending: Obstetrics & Gynecology | Admitting: Obstetrics & Gynecology

## 2020-06-06 ENCOUNTER — Encounter (HOSPITAL_COMMUNITY): Payer: Self-pay | Admitting: Obstetrics & Gynecology

## 2020-06-06 DIAGNOSIS — Z79899 Other long term (current) drug therapy: Secondary | ICD-10-CM | POA: Insufficient documentation

## 2020-06-06 DIAGNOSIS — Z3A28 28 weeks gestation of pregnancy: Secondary | ICD-10-CM

## 2020-06-06 DIAGNOSIS — R102 Pelvic and perineal pain: Secondary | ICD-10-CM

## 2020-06-06 DIAGNOSIS — Z0371 Encounter for suspected problem with amniotic cavity and membrane ruled out: Secondary | ICD-10-CM

## 2020-06-06 DIAGNOSIS — G35 Multiple sclerosis: Secondary | ICD-10-CM | POA: Diagnosis not present

## 2020-06-06 DIAGNOSIS — O26893 Other specified pregnancy related conditions, third trimester: Secondary | ICD-10-CM | POA: Diagnosis present

## 2020-06-06 DIAGNOSIS — O99353 Diseases of the nervous system complicating pregnancy, third trimester: Secondary | ICD-10-CM | POA: Diagnosis not present

## 2020-06-06 LAB — WET PREP, GENITAL
Clue Cells Wet Prep HPF POC: NONE SEEN
Sperm: NONE SEEN
Trich, Wet Prep: NONE SEEN
Yeast Wet Prep HPF POC: NONE SEEN

## 2020-06-06 LAB — URINALYSIS, ROUTINE W REFLEX MICROSCOPIC
Bilirubin Urine: NEGATIVE
Glucose, UA: >=500 mg/dL — AB
Hgb urine dipstick: NEGATIVE
Ketones, ur: 5 mg/dL — AB
Leukocytes,Ua: NEGATIVE
Nitrite: NEGATIVE
Protein, ur: NEGATIVE mg/dL
Specific Gravity, Urine: 1.029 (ref 1.005–1.030)
pH: 5 (ref 5.0–8.0)

## 2020-06-06 LAB — POCT FERN TEST: POCT Fern Test: NEGATIVE

## 2020-06-06 NOTE — MAU Note (Signed)
Pt reports vaginal spotting one time when wiping this morning.   Pt reports she urinates on herself all the time and knows when it is urine, but today the episode was different. Pt reports getting out of the car at 1000 and noticing that her pants were wet.   Pt reports since the episode she has been having abdominal and back cramps. Pt reports the back cramps started yesterday.

## 2020-06-06 NOTE — MAU Provider Note (Signed)
History     [id number]  Arrival date and time: 06/06/20 1312    Chief Complaint  Patient presents with  . Back Pain  . Abdominal Pain  . Rupture of Membranes     HPI [name] is a 34 y.o. at [redacted]w[redacted]d by 7wk [place] with PMHx notable for multiple sclerosis, preterm delivery x3, tubal ligation with reversal, hyperemesis, preE in prior pregnancies, fetal abdominal calcifications in this pregnancy, who presents for spotting and loss of fluid. .   Patient reports earlier today noticed wetness in her pants, followed by cramping and pressure This occurred around 10 AM Has frequent bladder control issues but this seemed different No color that she noticed, no odor Also has been having spotting with wiping No contractions that she's noticed Fetal movement normal   A/Positive/-- (09/13 1135)  OB History    Gravida  7   Para  3   Term  0   Preterm  3   AB  3   Living  4     SAB      IAB  3   Ectopic      Multiple  1   Live Births  4        Obstetric Comments  Induced early due to Pre- E with all prenancies        Past Medical History:  Diagnosis Date  . Abnormal Pap smear    Colpo>normal  . Fibroid   . Gonorrhea   . Headache    last migraine 01/2017  . History of IBS    watch diet, no meds  . Infection    UTI  . Multiple sclerosis ([place])   . Neuromuscular disorder [facility]    diagnosed with MS this visit  . Obesity   . Ovarian cyst   . Pernicious anemia 05/02/2013  . Pregnancy induced hypertension   . Preterm labor    all 3 deliveries  . SVD (spontaneous vaginal delivery)    x 3  . Urinary tract infection   . Vaginal Pap smear, abnormal     Past Surgical History:  Procedure Laterality Date  . CHOLECYSTECTOMY N/A 11/25/2017   Procedure: LAPAROSCOPIC CHOLECYSTECTOMY WITH INTRAOPERATIVE CHOLANGIOGRAM;  Surgeon: Armandina Gemma, MD;  Location: [facility];  Service: General;  Laterality: N/A;  . COLPOSCOPY    . MYOMECTOMY N/A 04/01/2017   Procedure:  Vaginal Myomectomy;  Surgeon: Osborne Oman, MD;  Location: [facility];  Service: Gynecology;  Laterality: N/A;  . TUBAL LIGATION     2012/ reversal June 2020  . WISDOM TOOTH EXTRACTION      Family History  Problem Relation Age of Onset  . Diabetes Mother   . Hypertension Mother   . Diabetes Father   . Hypertension Father   . Kidney disease Father   . Stroke Father   . Learning disabilities Brother   . Anesthesia problems Neg Hx   . Other Neg Hx     Social History   Socioeconomic History  . Marital status: Single    Spouse name: Not on file  . Number of children: 4  . Years of education: 67  . Highest education level: Not on file  Occupational History  . Occupation: UNEMPLOYED-DISABLE    Comment: [employer]  . Smoking status: Never Smoker  . Smokeless tobacco: Never Used  Vaping Use  . Vaping Use: Never used  Substance and Sexual Activity  . Alcohol use: No    Alcohol/week:  0.0 standard drinks    Comment:    . Drug use: No    Comment: denies use, states was given CBD gummies for nausea  . Sexual activity: Yes    Partners: Male  Other Topics Concern  . Not on file  Social History Narrative   Lives with 4 children. Good support group in her community including mother who lives close.    Social Determinants of Health   Financial Resource Strain: Not on file  Food Insecurity: Not on file  Transportation Needs: Not on file  Physical Activity: Not on file  Stress: Not on file  Social Connections: Not on file  Intimate Partner Violence: Not on file    No Known Allergies  No current facility-administered medications on file prior to encounter.   Current Outpatient Medications on File Prior to Encounter  Medication Sig Dispense Refill  . Blood Pressure Monitoring (BLOOD PRESSURE KIT) DEVI 1 kit by Does not apply route once a week. Check Blood Pressure regularly and record readings into the Babyscripts App.  Large Cuff.  DX O90.0 1 each 0  .  promethazine (PHENERGAN) 12.5 MG tablet Take 1-2 tablets (12.5-25 mg total) by mouth every 6 (six) hours as needed for nausea or vomiting. 30 tablet 2  . acetaminophen (TYLENOL) 325 MG tablet Take 650 mg by mouth every 6 (six) hours as needed.    Marland Kitchen aspirin EC 81 MG tablet Take 1 tablet (81 mg total) by mouth daily. Take after 12 weeks for prevention of preeclampsia later in pregnancy 300 tablet 2  . Butalbital-APAP-Caffeine 50-325-40 MG capsule Take 1 capsule by mouth every 6 (six) hours as needed for headache. (Patient not taking: No sig reported) 30 capsule 0  . cyclobenzaprine (FLEXERIL) 10 MG tablet Take 1 tablet (10 mg total) by mouth 2 (two) times daily as needed for muscle spasms. 20 tablet 0  . docusate sodium (COLACE) 100 MG capsule Take 1 capsule (100 mg total) by mouth 2 (two) times daily as needed for mild constipation. (Patient not taking: No sig reported) 30 capsule 2  . Elastic Bandages & Supports (COMFORT FIT MATERNITY SUPP MED) MISC 1 Device by Does not apply route daily. 1 each 0  . metoCLOPramide (REGLAN) 10 MG tablet Take 1 tablet (10 mg total) by mouth 3 (three) times daily with meals. (Patient not taking: No sig reported) 90 tablet 0  . ondansetron (ZOFRAN) 4 MG tablet Take 1 tablet (4 mg total) by mouth every 8 (eight) hours as needed for nausea or vomiting. (Patient not taking: Reported on 06/03/2020) 20 tablet 5  . prochlorperazine (COMPAZINE) 10 MG tablet Take 1 tablet (10 mg total) by mouth 2 (two) times daily as needed for nausea or vomiting. (Patient not taking: No sig reported) 30 tablet 0  . scopolamine (TRANSDERM-SCOP) 1 MG/3DAYS Place 1 patch (1.5 mg total) onto the skin every 3 (three) days. (Patient not taking: No sig reported) 10 patch 12     ROS Pertinent positives and negative per HPI, all others reviewed and negative  Physical Exam   BP 120/68 (BP Location: Right Arm)   Pulse 93   Temp 97.8 F (36.6 C) (Oral)   Resp 20   LMP 11/17/2019   SpO2 99%    Physical Exam Vitals reviewed.  Constitutional:      General: She is not in acute distress.    Appearance: She is well-developed and well-nourished. She is not diaphoretic.  Eyes:     General: No scleral icterus.  Pulmonary:     Effort: Pulmonary effort is normal. No respiratory distress.  Abdominal:     General: There is no distension.     Palpations: Abdomen is soft.     Tenderness: There is no abdominal tenderness. There is no guarding or rebound.  Musculoskeletal:        General: No edema.  Skin:    General: Skin is warm and dry.  Neurological:     Mental Status: She is alert.     Coordination: Coordination normal.  Psychiatric:        Mood and Affect: Mood and affect normal.      Cervical Exam Dilation: 1 Effacement (%): Thick Cervical Position: Posterior Exam by:: Dr. Dione Plover  Bedside Ultrasound Not done  My interpretation: n/a  FHT Baseline 140, moderate variability, +accels, no decels Toco: no ctx Cat: I, reactive  Labs Results for orders placed or performed during the hospital encounter of 06/06/20 (from the past 24 hour(s))  Wet prep, genital     Status: Abnormal   Collection Time: 06/06/20  2:22 PM   Specimen: Vaginal  Result Value Ref Range   Yeast Wet Prep HPF POC NONE SEEN NONE SEEN   Trich, Wet Prep NONE SEEN NONE SEEN   Clue Cells Wet Prep HPF POC NONE SEEN NONE SEEN   WBC, Wet Prep HPF POC FEW (A) NONE SEEN   Sperm NONE SEEN     Imaging No results found.  MAU Course  Procedures  Lab Orders     Wet prep, genital     OB Urine Culture     Urinalysis, Routine w reflex microscopic Urine, Clean Catch No orders of the defined types were placed in this encounter.  Imaging Orders  No imaging studies ordered today    MDM mild  Assessment and Plan  #Evaluation for ROM, ROM not found #Pelvic pain SSE neg pool, neg fern on slide, ruled out for rupture. Wet prep neg for infections. No contractions on monitor, only mildly dilated but  still thick cervix, c/w multip. Likely discomfort secondary to advancing gestational age.   D/c to home in stable condition.   #FWB FHT Cat I NST: Reactive  Clarnce Flock

## 2020-06-07 LAB — CULTURE, OB URINE

## 2020-06-09 ENCOUNTER — Other Ambulatory Visit: Payer: Self-pay

## 2020-06-09 ENCOUNTER — Inpatient Hospital Stay (HOSPITAL_COMMUNITY)
Admission: AD | Admit: 2020-06-09 | Discharge: 2020-06-09 | Disposition: A | Discharge status: Home / Self Care | Payer: Medicare HMO | Attending: Obstetrics and Gynecology | Admitting: Obstetrics and Gynecology

## 2020-06-09 DIAGNOSIS — R109 Unspecified abdominal pain: Secondary | ICD-10-CM | POA: Diagnosis present

## 2020-06-09 DIAGNOSIS — O210 Mild hyperemesis gravidarum: Secondary | ICD-10-CM | POA: Insufficient documentation

## 2020-06-09 DIAGNOSIS — Z9049 Acquired absence of other specified parts of digestive tract: Secondary | ICD-10-CM | POA: Insufficient documentation

## 2020-06-09 DIAGNOSIS — Z87440 Personal history of urinary (tract) infections: Secondary | ICD-10-CM | POA: Insufficient documentation

## 2020-06-09 DIAGNOSIS — Z3A29 29 weeks gestation of pregnancy: Secondary | ICD-10-CM | POA: Diagnosis not present

## 2020-06-09 DIAGNOSIS — O4703 False labor before 37 completed weeks of gestation, third trimester: Secondary | ICD-10-CM | POA: Diagnosis not present

## 2020-06-09 DIAGNOSIS — Z7982 Long term (current) use of aspirin: Secondary | ICD-10-CM | POA: Diagnosis not present

## 2020-06-09 DIAGNOSIS — O212 Late vomiting of pregnancy: Secondary | ICD-10-CM | POA: Diagnosis not present

## 2020-06-09 DIAGNOSIS — Z3480 Encounter for supervision of other normal pregnancy, unspecified trimester: Secondary | ICD-10-CM

## 2020-06-09 LAB — URINALYSIS, ROUTINE W REFLEX MICROSCOPIC
Bilirubin Urine: NEGATIVE
Glucose, UA: 50 mg/dL — AB
Ketones, ur: 5 mg/dL — AB
Leukocytes,Ua: NEGATIVE
Nitrite: NEGATIVE
Protein, ur: NEGATIVE mg/dL
Specific Gravity, Urine: 1.023 (ref 1.005–1.030)
pH: 5 (ref 5.0–8.0)

## 2020-06-09 MED ORDER — NIFEDIPINE 10 MG PO CAPS: 10.0000 mg | ORAL_CAPSULE | Freq: Three times a day (TID) | ORAL | 1 refills | Status: DC

## 2020-06-09 MED ORDER — NIFEDIPINE 10 MG PO CAPS
10.0000 mg | ORAL_CAPSULE | ORAL | Status: DC | PRN
  Administered 2020-06-09 (×2): 10 mg via ORAL
  Filled 2020-06-09 (×2): qty 1

## 2020-06-09 NOTE — MAU Provider Note (Signed)
Patient [name] is a 34 y.o. [phone] at 59w2dhere with complaints of contractions and pelvic pressure. The contractions started at 5 pm. She denies LOF, bleeding, decreased fetal movements. She has a history of pre-eclampsia with IOL. She denies pain with urination or abnormal discharge.   She had intercourse last night.    History     CSN: [id number] Arrival date and time: 06/09/20 2026   None     Chief Complaint  Patient presents with  . Contractions   Abdominal Pain This is a new problem. The current episode started today. The problem occurs intermittently. The pain is at a severity of 7/10. The quality of the pain is cramping. Pertinent negatives include no constipation, diarrhea, dysuria, fever, nausea or vomiting. Nothing aggravates the pain. The pain is relieved by nothing.   She reports that she has not been urinating as much, but that she is eating and drinking normally.   She was seen for this complaint on Thursday (4 days ago), but came back tonight because the pain was worse.  Her cervix was 1 cm/long/thick on Thursday.  OB History    Gravida  7   Para  3   Term  0   Preterm  3   AB  3   Living  4     SAB      IAB  3   Ectopic      Multiple  1   Live Births  4        Obstetric Comments  Induced early due to Pre- E with all prenancies        Past Medical History:  Diagnosis Date  . Abnormal Pap smear    Colpo>normal  . Fibroid   . Gonorrhea   . Headache    last migraine 01/2017  . History of IBS    watch diet, no meds  . Infection    UTI  . Multiple sclerosis ([place]   . Neuromuscular disorder ([facility]    diagnosed with MS this visit  . Obesity   . Ovarian cyst   . Pernicious anemia 05/02/2013  . Pregnancy induced hypertension   . Preterm labor    all 3 deliveries  . SVD (spontaneous vaginal delivery)    x 3  . Urinary tract infection   . Vaginal Pap smear, abnormal     Past Surgical History:  Procedure Laterality Date   . CHOLECYSTECTOMY N/A 11/25/2017   Procedure: LAPAROSCOPIC CHOLECYSTECTOMY WITH INTRAOPERATIVE CHOLANGIOGRAM;  Surgeon: GArmandina Gemma MD;  [facility];  Service: General;  Laterality: N/A;  . COLPOSCOPY    . MYOMECTOMY N/A 04/01/2017   Procedure: Vaginal Myomectomy;  Surgeon: AOsborne Oman MD;  [facility];  Service: Gynecology;  Laterality: N/A;  . TUBAL LIGATION     2012/ reversal June 2020  . WISDOM TOOTH EXTRACTION      Family History  Problem Relation Age of Onset  . Diabetes Mother   . Hypertension Mother   . Diabetes Father   . Hypertension Father   . Kidney disease Father   . Stroke Father   . Learning disabilities Brother   . Anesthesia problems Neg Hx   . Other Neg Hx     Social History   Tobacco Use  . Smoking status: Never Smoker  . Smokeless tobacco: Never Used  Vaping Use  . Vaping Use: Never used  Substance Use Topics  . Alcohol use: No    Alcohol/week: 0.0  standard drinks    Comment:    . Drug use: No    Comment: denies use, states was given CBD gummies for nausea    Allergies: No Known Allergies  Medications Prior to Admission  Medication Sig Dispense Refill Last Dose  . acetaminophen (TYLENOL) 325 MG tablet Take 650 mg by mouth every 6 (six) hours as needed.     Marland Kitchen aspirin EC 81 MG tablet Take 1 tablet (81 mg total) by mouth daily. Take after 12 weeks for prevention of preeclampsia later in pregnancy 300 tablet 2   . Blood Pressure Monitoring (BLOOD PRESSURE KIT) DEVI 1 kit by Does not apply route once a week. Check Blood Pressure regularly and record readings into the Babyscripts App.  Large Cuff.  DX O90.0 1 each 0   . Butalbital-APAP-Caffeine 50-325-40 MG capsule Take 1 capsule by mouth every 6 (six) hours as needed for headache. (Patient not taking: No sig reported) 30 capsule 0   . cyclobenzaprine (FLEXERIL) 10 MG tablet Take 1 tablet (10 mg total) by mouth 2 (two) times daily as needed for muscle spasms. 20 tablet 0   . docusate  sodium (COLACE) 100 MG capsule Take 1 capsule (100 mg total) by mouth 2 (two) times daily as needed for mild constipation. (Patient not taking: No sig reported) 30 capsule 2   . Elastic Bandages & Supports (COMFORT FIT MATERNITY SUPP MED) MISC 1 Device by Does not apply route daily. 1 each 0   . metoCLOPramide (REGLAN) 10 MG tablet Take 1 tablet (10 mg total) by mouth 3 (three) times daily with meals. (Patient not taking: No sig reported) 90 tablet 0   . ondansetron (ZOFRAN) 4 MG tablet Take 1 tablet (4 mg total) by mouth every 8 (eight) hours as needed for nausea or vomiting. (Patient not taking: Reported on 06/03/2020) 20 tablet 5   . prochlorperazine (COMPAZINE) 10 MG tablet Take 1 tablet (10 mg total) by mouth 2 (two) times daily as needed for nausea or vomiting. (Patient not taking: No sig reported) 30 tablet 0   . promethazine (PHENERGAN) 12.5 MG tablet Take 1-2 tablets (12.5-25 mg total) by mouth every 6 (six) hours as needed for nausea or vomiting. 30 tablet 2   . scopolamine (TRANSDERM-SCOP) 1 MG/3DAYS Place 1 patch (1.5 mg total) onto the skin every 3 (three) days. (Patient not taking: No sig reported) 10 patch 12     Review of Systems  Constitutional: Negative for fever.  Gastrointestinal: Positive for abdominal pain. Negative for constipation, diarrhea, nausea and vomiting.  Genitourinary: Negative for dysuria.   Physical Exam   Blood pressure 140/76, pulse 94, resp. rate 17, last menstrual period 11/17/2019, SpO2 100 %, unknown if currently breastfeeding.  Physical Exam Constitutional:      Appearance: Normal appearance.  HENT:     Head: Normocephalic.  Cardiovascular:     Rate and Rhythm: Normal rate.  Pulmonary:     Effort: Pulmonary effort is normal.  Genitourinary:    General: Normal vulva.     Vagina: No vaginal discharge.  Musculoskeletal:        General: Normal range of motion.  Skin:    General: Skin is warm and dry.  Neurological:     Mental Status: She is  alert.     MAU Course  Procedures  MDM -NST: 130 bpm, mod var, present acel, no decels, irregular contractions.  -Patient had two doses of procardia and reports that she feels better -UA shows mild dehydration -  wet prep not collected because patient just had collected -cervix is unchanged from Thursday, still 1 cm/long/thick   Assessment and Plan   1. Preterm labor in second trimester without delivery   2. Hyperemesis affecting pregnancy, antepartum   3. Supervision of other normal pregnancy, antepartum    -Reviewed return precautions, including signs of labor, signs of pre-e -RX for procardia given, patient can take as needed but not more than 3/day. Reviewed warning signs of procardia.  -Keep appt  on 06/20/2020  Mervyn Skeeters [facility] 06/09/2020, 9:18 PM

## 2020-06-09 NOTE — MAU Note (Signed)
...  [name] is a 34 y.o. at [redacted]w[redacted]d here in MAU reporting: CTX that began three hours ago. Patient reports they are frequent and rates them a 10/10. She is also experiencing lower abdominal pressure. +FM. No VB.  She reports she was here in MAU with CTX as well but reports they were not this painful nor frequent.  Hx of preterm labor.  Lab orders placed from triage: UA

## 2020-06-09 NOTE — Discharge Instructions (Signed)

## 2020-06-10 LAB — GC/CHLAMYDIA PROBE AMP (~~LOC~~) NOT AT ARMC
Chlamydia: NEGATIVE
Comment: NEGATIVE
Comment: NORMAL
Neisseria Gonorrhea: NEGATIVE

## 2020-06-15 NOTE — L&D Delivery Note (Signed)
OB/GYN Faculty Practice Delivery Note  [name] is a 35 y.o. [id number] s/p SVD at [redacted]w[redacted]d. She was admitted for acute MS episode, brought up today for IOL.   ROM: 2h 33m with clear fluid GBS Status:  Negative/-- (01/22 0000) Maximum Maternal Temperature: 98  Labor Progress: . Initial SVE: 1cm. Received cytotec, FB. AROM'd at [place] and started on pitocin. She then progressed to complete.   Delivery Date/Time: 2/9 at 21:33  Delivery: Called to room and patient was complete and pushing. Head delivered LOA. Tight nuchal cord present. Shoulder and body delivered in usual fashion. Infant with spontaneous cry, placed on mother's abdomen, dried and stimulated. Cord clamped x 2 after 1-minute delay, and cut by FOB. Cord blood drawn. Placenta delivered spontaneously with gentle cord traction. Fundus firm with massage and Pitocin. Labia, perineum, vagina, and cervix inspected inspected with no lacerations. Post-placental Liletta IUD placed without issue. See separate procedure note for details.   Baby Weight: pending  Placenta: Sent to path Complications: None Lacerations: none EBL: 50 mL Analgesia: Epidural   Infant:  APGAR (1 MIN): 8   APGAR (5 MINS): 9   APGAR (10 MINS):     Sharene Skeans, MD [facility] Family Medicine Fellow, [facility] for [facility], [place] 07/24/2020, 9:53 PM

## 2020-06-20 ENCOUNTER — Encounter: Payer: Self-pay | Admitting: Obstetrics and Gynecology

## 2020-06-20 ENCOUNTER — Other Ambulatory Visit: Payer: Self-pay

## 2020-06-20 ENCOUNTER — Ambulatory Visit (INDEPENDENT_AMBULATORY_CARE_PROVIDER_SITE_OTHER): Payer: Medicare HMO | Admitting: Obstetrics and Gynecology

## 2020-06-20 VITALS — BP 99/66 | HR 86 | Wt 263.0 lb

## 2020-06-20 DIAGNOSIS — Z3480 Encounter for supervision of other normal pregnancy, unspecified trimester: Secondary | ICD-10-CM

## 2020-06-20 DIAGNOSIS — Z9889 Other specified postprocedural states: Secondary | ICD-10-CM

## 2020-06-20 DIAGNOSIS — O09299 Supervision of pregnancy with other poor reproductive or obstetric history, unspecified trimester: Secondary | ICD-10-CM

## 2020-06-20 NOTE — Progress Notes (Addendum)
   PRENATAL VISIT NOTE  Subjective:  CARALINA NOP is a 35 y.o. [id number] at [redacted]w[redacted]d being seen today for ongoing prenatal care.  She is currently monitored for the following issues for this high-risk pregnancy and has OBESITY, NOS; Abdominal pain; Adjustment disorder with mixed anxiety and depressed mood; Multiple sclerosis (HCC); Acute relapsing multiple sclerosis (HCC); B12 deficiency; Child behavior problem; Cervical mass; Insomnia; Malabsorption; Fibroid of cervix; Endometriosis; Anxiety; Desire for pregnancy; Adenomyosis; Elevated blood pressure reading; History of reversal of tubal ligation; Hyperemesis affecting pregnancy, antepartum; Supervision of other normal pregnancy, antepartum; and Hx of preeclampsia, prior pregnancy, currently pregnant on their problem list.  Patient reports no complaints.  Contractions: Irritability. Vag. Bleeding: None.  Movement: Present. Denies leaking of fluid.   The following portions of the patient's history were reviewed and updated as appropriate: allergies, current medications, past family history, past medical history, past social history, past surgical history and problem list.   Objective:   Vitals:   06/20/20 1045  BP: 99/66  Pulse: 86  Weight: 263 lb (119.3 kg)    Fetal Status: Fetal Heart Rate (bpm): 142 Fundal Height: 32 cm Movement: Present     General:  Alert, oriented and cooperative. Patient is in no acute distress.  Skin: Skin is warm and dry. No rash noted.   Cardiovascular: Normal heart rate noted  Respiratory: Normal respiratory effort, no problems with respiration noted  Abdomen: Soft, gravid, appropriate for gestational age.  Pain/Pressure: Present     Pelvic: Cervical exam deferred        Extremities: Normal range of motion.  Edema: None  Mental Status: Normal mood and affect. Normal behavior. Normal judgment and thought content.   Assessment and Plan:  Pregnancy: [id number] at [redacted]w[redacted]d 1. Supervision of other normal pregnancy,  antepartum Patient is doing well without complaints She reports improvement in her braxton hicks contractions without procardia  2. Hx of preeclampsia, prior pregnancy, currently pregnant Continue ASA  3. History of reversal of tubal ligation Plans for contraception pending vasectomy- patient undecided at this time  Preterm labor symptoms and general obstetric precautions including but not limited to vaginal bleeding, contractions, leaking of fluid and fetal movement were reviewed in detail with the patient. Please refer to After Visit Summary for other counseling recommendations.   Return in about 2 weeks (around 07/04/2020) for in person, ROB, High risk.  Future Appointments  Date Time Provider Department Center  06/21/2020 10:15 AM [facility] NURSE [facility]  06/21/2020 10:30 AM [facility] US3 [facility]  07/05/2020 10:15 AM Jerene Bears, MD [facility] None    Catalina Antigua, MD

## 2020-06-20 NOTE — Progress Notes (Signed)
ROB [redacted]w[redacted]d recent MAU visit on 06/09/20 for ctx's Rx was sent after MAU visit  pt states ins does not cover procardia.  CC: Pain , and Braxton hxs.  Will need to get B/P at working machine before pt leaves

## 2020-06-21 ENCOUNTER — Ambulatory Visit: Payer: Medicare HMO | Admitting: *Deleted

## 2020-06-21 ENCOUNTER — Ambulatory Visit: Payer: Medicare HMO | Attending: Obstetrics and Gynecology

## 2020-06-21 ENCOUNTER — Other Ambulatory Visit: Payer: Self-pay | Admitting: *Deleted

## 2020-06-21 ENCOUNTER — Encounter: Payer: Self-pay | Admitting: *Deleted

## 2020-06-21 DIAGNOSIS — O210 Mild hyperemesis gravidarum: Secondary | ICD-10-CM

## 2020-06-21 DIAGNOSIS — O99353 Diseases of the nervous system complicating pregnancy, third trimester: Secondary | ICD-10-CM

## 2020-06-21 DIAGNOSIS — O99212 Obesity complicating pregnancy, second trimester: Secondary | ICD-10-CM

## 2020-06-21 DIAGNOSIS — O09213 Supervision of pregnancy with history of pre-term labor, third trimester: Secondary | ICD-10-CM

## 2020-06-21 DIAGNOSIS — O358XX0 Maternal care for other (suspected) fetal abnormality and damage, not applicable or unspecified: Secondary | ICD-10-CM | POA: Insufficient documentation

## 2020-06-21 DIAGNOSIS — IMO0002 Reserved for concepts with insufficient information to code with codable children: Secondary | ICD-10-CM

## 2020-06-21 DIAGNOSIS — Z3480 Encounter for supervision of other normal pregnancy, unspecified trimester: Secondary | ICD-10-CM | POA: Diagnosis present

## 2020-06-21 DIAGNOSIS — E669 Obesity, unspecified: Secondary | ICD-10-CM

## 2020-06-21 DIAGNOSIS — Z3A31 31 weeks gestation of pregnancy: Secondary | ICD-10-CM

## 2020-06-21 DIAGNOSIS — G35 Multiple sclerosis: Secondary | ICD-10-CM

## 2020-06-23 ENCOUNTER — Encounter (HOSPITAL_COMMUNITY): Payer: Self-pay | Admitting: Obstetrics and Gynecology

## 2020-06-23 ENCOUNTER — Inpatient Hospital Stay (HOSPITAL_COMMUNITY)
Admission: AD | Admit: 2020-06-23 | Discharge: 2020-06-23 | Disposition: A | Discharge status: Home / Self Care | Payer: Medicare HMO | Attending: Obstetrics and Gynecology | Admitting: Obstetrics and Gynecology

## 2020-06-23 ENCOUNTER — Other Ambulatory Visit: Payer: Self-pay

## 2020-06-23 DIAGNOSIS — O4693 Antepartum hemorrhage, unspecified, third trimester: Secondary | ICD-10-CM

## 2020-06-23 DIAGNOSIS — R109 Unspecified abdominal pain: Secondary | ICD-10-CM | POA: Insufficient documentation

## 2020-06-23 DIAGNOSIS — W228XXA Striking against or struck by other objects, initial encounter: Secondary | ICD-10-CM | POA: Diagnosis not present

## 2020-06-23 DIAGNOSIS — Z3A31 31 weeks gestation of pregnancy: Secondary | ICD-10-CM | POA: Diagnosis not present

## 2020-06-23 DIAGNOSIS — Z3689 Encounter for other specified antenatal screening: Secondary | ICD-10-CM

## 2020-06-23 DIAGNOSIS — O9A213 Injury, poisoning and certain other consequences of external causes complicating pregnancy, third trimester: Secondary | ICD-10-CM | POA: Diagnosis not present

## 2020-06-23 DIAGNOSIS — O26893 Other specified pregnancy related conditions, third trimester: Secondary | ICD-10-CM | POA: Insufficient documentation

## 2020-06-23 DIAGNOSIS — Z87440 Personal history of urinary (tract) infections: Secondary | ICD-10-CM | POA: Diagnosis not present

## 2020-06-23 DIAGNOSIS — W19XXXA Unspecified fall, initial encounter: Secondary | ICD-10-CM | POA: Diagnosis not present

## 2020-06-23 MED ORDER — ONDANSETRON 4 MG PO TBDP
8.0000 mg | ORAL_TABLET | Freq: Once | ORAL | Status: AC
  Administered 2020-06-23: 8 mg via ORAL
  Filled 2020-06-23: qty 2

## 2020-06-23 MED ORDER — HYDROCODONE-ACETAMINOPHEN 5-325 MG PO TABS
2.0000 | ORAL_TABLET | Freq: Once | ORAL | Status: AC
  Administered 2020-06-23: 2 via ORAL
  Filled 2020-06-23: qty 2

## 2020-06-23 NOTE — MAU Note (Signed)
[name] is a 35 y.o. at [redacted]w[redacted]d here in MAU reporting: within the past hour was going up some stairs at home and hit her abdomen on the stair rail. Saw some spotting immediately after. Currently having abdominal pain. Denies LOF.  Onset of complaint: today  Pain score: 6/10  Vitals:   06/23/20 1432  BP: (!) 99/48  Pulse: 98  Resp: 18  Temp: 98.4 F (36.9 C)  SpO2: 97%     FHT: EFM applied  Lab orders placed from triage: none

## 2020-06-23 NOTE — Discharge Instructions (Signed)

## 2020-06-23 NOTE — MAU Provider Note (Signed)
History     CSN: [id number]  Arrival date and time: 06/23/20 1408   Event Date/Time   First Provider Initiated Contact with Patient 06/23/20 1441      Chief Complaint  Patient presents with  . Abdominal Pain  . Vaginal Bleeding   [name] is a 35 y.o. [id number] at [redacted]w[redacted]d who presents today after hitting her abdomen. She reports that at approx 1345 she was at the top of the stairs, and made a sharp turn to turn around and ran into the handrail at the top of the stairs. She states that since that time she has had abdominal pain and cramping. She reports normal fetal movement. She had some spotting immediately afterward, but none since. She denies any covid exposures. She is vaccinated for covid.   Abdominal Pain This is a new problem. The current episode started today. The onset quality is sudden. The problem has been unchanged. The pain is located in the periumbilical region. The pain is at a severity of 8/10. The quality of the pain is cramping (tightness ). The abdominal pain does not radiate. Associated symptoms include headaches (taking tylenol and this is helping a little bit. ). Pertinent negatives include no fever. Exacerbated by: after hitting her abdomen  The pain is relieved by nothing. She has tried nothing for the symptoms.  Vaginal Bleeding Associated symptoms include abdominal pain and headaches (taking tylenol and this is helping a little bit. ). Pertinent negatives include no chills or fever.    OB History    Gravida  7   Para  3   Term  0   Preterm  3   AB  3   Living  4     SAB      IAB  3   Ectopic      Multiple  1   Live Births  4        Obstetric Comments  Induced early due to Pre- E with all prenancies        Past Medical History:  Diagnosis Date  . Abnormal Pap smear    Colpo>normal  . Fibroid   . Gonorrhea   . Headache    last migraine 01/2017  . History of IBS    watch diet, no meds  . Infection    UTI  . Multiple sclerosis  ([place])   . Neuromuscular disorder [facility])    diagnosed with MS this visit  . Obesity   . Ovarian cyst   . Pernicious anemia 05/02/2013  . Pregnancy induced hypertension   . Preterm labor    all 3 deliveries  . SVD (spontaneous vaginal delivery)    x 3  . Urinary tract infection   . Vaginal Pap smear, abnormal     Past Surgical History:  Procedure Laterality Date  . CHOLECYSTECTOMY N/A 11/25/2017   Procedure: LAPAROSCOPIC CHOLECYSTECTOMY WITH INTRAOPERATIVE CHOLANGIOGRAM;  Surgeon: Armandina Gemma, MD;  Location: [facility];  Service: General;  Laterality: N/A;  . COLPOSCOPY    . MYOMECTOMY N/A 04/01/2017   Procedure: Vaginal Myomectomy;  Surgeon: Osborne Oman, MD;  Location: [facility];  Service: Gynecology;  Laterality: N/A;  . TUBAL LIGATION     2012/ reversal June 2020  . WISDOM TOOTH EXTRACTION      Family History  Problem Relation Age of Onset  . Diabetes Mother   . Hypertension Mother   . Diabetes Father   . Hypertension Father   . Kidney disease Father   .  Stroke Father   . Learning disabilities Brother   . Anesthesia problems Neg Hx   . Other Neg Hx     Social History   Tobacco Use  . Smoking status: Never Smoker  . Smokeless tobacco: Never Used  Vaping Use  . Vaping Use: Never used  Substance Use Topics  . Alcohol use: No    Alcohol/week: 0.0 standard drinks    Comment:    . Drug use: No    Comment: denies use, states was given CBD gummies for nausea    Allergies: No Known Allergies  Medications Prior to Admission  Medication Sig Dispense Refill Last Dose  . acetaminophen (TYLENOL) 325 MG tablet Take 650 mg by mouth every 6 (six) hours as needed.     Marland Kitchen aspirin EC 81 MG tablet Take 1 tablet (81 mg total) by mouth daily. Take after 12 weeks for prevention of preeclampsia later in pregnancy 300 tablet 2   . Blood Pressure Monitoring (BLOOD PRESSURE KIT) DEVI 1 kit by Does not apply route once a week. Check Blood Pressure regularly and record readings into the  Babyscripts App.  Large Cuff.  DX O90.0 1 each 0   . Butalbital-APAP-Caffeine 50-325-40 MG capsule Take 1 capsule by mouth every 6 (six) hours as needed for headache. (Patient not taking: No sig reported) 30 capsule 0   . cyclobenzaprine (FLEXERIL) 10 MG tablet Take 1 tablet (10 mg total) by mouth 2 (two) times daily as needed for muscle spasms. (Patient not taking: Reported on 06/20/2020) 20 tablet 0   . docusate sodium (COLACE) 100 MG capsule Take 1 capsule (100 mg total) by mouth 2 (two) times daily as needed for mild constipation. (Patient not taking: No sig reported) 30 capsule 2   . Elastic Bandages & Supports (COMFORT FIT MATERNITY SUPP MED) MISC 1 Device by Does not apply route daily. 1 each 0   . metoCLOPramide (REGLAN) 10 MG tablet Take 1 tablet (10 mg total) by mouth 3 (three) times daily with meals. (Patient not taking: No sig reported) 90 tablet 0   . NIFEdipine (PROCARDIA) 10 MG capsule Take 1 capsule (10 mg total) by mouth 3 (three) times daily. (Patient not taking: Reported on 06/20/2020) 30 capsule 1   . ondansetron (ZOFRAN) 4 MG tablet Take 1 tablet (4 mg total) by mouth every 8 (eight) hours as needed for nausea or vomiting. (Patient not taking: No sig reported) 20 tablet 5   . prochlorperazine (COMPAZINE) 10 MG tablet Take 1 tablet (10 mg total) by mouth 2 (two) times daily as needed for nausea or vomiting. (Patient not taking: No sig reported) 30 tablet 0   . promethazine (PHENERGAN) 12.5 MG tablet Take 1-2 tablets (12.5-25 mg total) by mouth every 6 (six) hours as needed for nausea or vomiting. 30 tablet 2   . scopolamine (TRANSDERM-SCOP) 1 MG/3DAYS Place 1 patch (1.5 mg total) onto the skin every 3 (three) days. (Patient not taking: No sig reported) 10 patch 12     Review of Systems  Constitutional: Negative for chills and fever.  Gastrointestinal: Positive for abdominal pain.  Genitourinary: Positive for vaginal bleeding.  Neurological: Positive for headaches (taking tylenol and  this is helping a little bit. ).   Physical Exam   Blood pressure (!) 99/48, pulse 98, temperature 98.4 F (36.9 C), temperature source Oral, resp. rate 18, last menstrual period 11/17/2019, SpO2 97 %, unknown if currently breastfeeding.  Physical Exam Vitals and nursing note reviewed. Exam conducted with  a chaperone present.  Constitutional:      General: She is not in acute distress. HENT:     Head: Normocephalic.     Nose: No congestion.  Eyes:     Pupils: Pupils are equal, round, and reactive to light.  Cardiovascular:     Rate and Rhythm: Normal rate.  Pulmonary:     Effort: Pulmonary effort is normal.  Abdominal:     Palpations: Abdomen is soft.     Tenderness: There is no abdominal tenderness.     Comments: No bruise or injury noted   Genitourinary:    Comments: Cervix: 1/thick/posterior Skin:    General: Skin is warm and dry.  Neurological:     Mental Status: She is alert and oriented to person, place, and time.  Psychiatric:        Mood and Affect: Mood normal.        Behavior: Behavior normal.    NST:  Baseline: 130 Variability: moderate Accels: 15x15 Decels: none Toco: none Reactive/Appropriate for GA   MAU Course  Procedures  MDM Patient has had 4 hours of monitoring. FHR has remained reactive the entire time. No contractions. Cervical exam is unchanged from prior exams  Assessment and Plan   1. Fall, initial encounter   2. Traumatic injury during pregnancy in third trimester   3. [redacted] weeks gestation of pregnancy   4. NST (non-stress test) reactive    DC home Comfort measures reviewed  3rd Trimester precautions  Bleeding precautions PTL precautions  Fetal kick counts RX: none  Return to MAU as needed FU with OB as planned   Follow-up Information    [facility] Follow up.   Contact information: [address] [place] [address] [place] DNP, CNM  06/23/20   6:28 PM

## 2020-06-26 ENCOUNTER — Encounter (HOSPITAL_COMMUNITY): Payer: Self-pay | Admitting: Obstetrics & Gynecology

## 2020-06-26 ENCOUNTER — Inpatient Hospital Stay (HOSPITAL_COMMUNITY)
Admission: AD | Admit: 2020-06-26 | Discharge: 2020-06-26 | Disposition: A | Discharge status: Home / Self Care | Payer: Medicare HMO | Attending: Advanced Practice Midwife | Admitting: Advanced Practice Midwife

## 2020-06-26 ENCOUNTER — Other Ambulatory Visit: Payer: Self-pay

## 2020-06-26 DIAGNOSIS — Z3A31 31 weeks gestation of pregnancy: Secondary | ICD-10-CM | POA: Insufficient documentation

## 2020-06-26 DIAGNOSIS — Z7982 Long term (current) use of aspirin: Secondary | ICD-10-CM | POA: Insufficient documentation

## 2020-06-26 DIAGNOSIS — D508 Other iron deficiency anemias: Secondary | ICD-10-CM | POA: Diagnosis not present

## 2020-06-26 DIAGNOSIS — O99013 Anemia complicating pregnancy, third trimester: Secondary | ICD-10-CM | POA: Insufficient documentation

## 2020-06-26 DIAGNOSIS — O210 Mild hyperemesis gravidarum: Secondary | ICD-10-CM

## 2020-06-26 DIAGNOSIS — O99353 Diseases of the nervous system complicating pregnancy, third trimester: Secondary | ICD-10-CM

## 2020-06-26 DIAGNOSIS — O212 Late vomiting of pregnancy: Secondary | ICD-10-CM

## 2020-06-26 DIAGNOSIS — Z3480 Encounter for supervision of other normal pregnancy, unspecified trimester: Secondary | ICD-10-CM

## 2020-06-26 DIAGNOSIS — G35 Multiple sclerosis: Secondary | ICD-10-CM | POA: Diagnosis not present

## 2020-06-26 LAB — CBC
HCT: 27.2 % — ABNORMAL LOW (ref 36.0–46.0)
Hemoglobin: 8.7 g/dL — ABNORMAL LOW (ref 12.0–15.0)
MCH: 29.9 pg (ref 26.0–34.0)
MCHC: 32 g/dL (ref 30.0–36.0)
MCV: 93.5 fL (ref 80.0–100.0)
Platelets: 403 10*3/uL — ABNORMAL HIGH (ref 150–400)
RBC: 2.91 MIL/uL — ABNORMAL LOW (ref 3.87–5.11)
RDW: 13.4 % (ref 11.5–15.5)
WBC: 10.5 10*3/uL (ref 4.0–10.5)
nRBC: 0.3 % — ABNORMAL HIGH (ref 0.0–0.2)

## 2020-06-26 LAB — COMPREHENSIVE METABOLIC PANEL
ALT: 14 U/L (ref 0–44)
AST: 18 U/L (ref 15–41)
Albumin: 2.7 g/dL — ABNORMAL LOW (ref 3.5–5.0)
Alkaline Phosphatase: 50 U/L (ref 38–126)
Anion gap: 10 (ref 5–15)
BUN: 6 mg/dL (ref 6–20)
CO2: 23 mmol/L (ref 22–32)
Calcium: 9 mg/dL (ref 8.9–10.3)
Chloride: 103 mmol/L (ref 98–111)
Creatinine, Ser: 0.74 mg/dL (ref 0.44–1.00)
GFR, Estimated: >60 mL/min (ref >60)
Glucose, Bld: 143 mg/dL — ABNORMAL HIGH (ref 70–99)
Potassium: 3.8 mmol/L (ref 3.5–5.1)
Sodium: 136 mmol/L (ref 135–145)
Total Bilirubin: 0.5 mg/dL (ref 0.3–1.2)
Total Protein: 6.2 g/dL — ABNORMAL LOW (ref 6.5–8.1)

## 2020-06-26 LAB — MAGNESIUM: Magnesium: 1.6 mg/dL — ABNORMAL LOW (ref 1.7–2.4)

## 2020-06-26 MED ORDER — SODIUM CHLORIDE 0.9 % IV SOLN
200.0000 mg | Freq: Once | INTRAVENOUS | Status: AC
  Administered 2020-06-26: 200 mg via INTRAVENOUS
  Filled 2020-06-26: qty 10

## 2020-06-26 MED ORDER — SODIUM CHLORIDE 0.9 % IV SOLN
1000.0000 mg | Freq: Once | INTRAVENOUS | Status: AC
  Administered 2020-06-26: 1000 mg via INTRAVENOUS
  Filled 2020-06-26: qty 8

## 2020-06-26 NOTE — MAU Provider Note (Signed)
History     CSN: [id number]  Arrival date and time: 06/26/20 1707   Event Date/Time   First Provider Initiated Contact with Patient 06/26/20 1743      Chief Complaint  Patient presents with  . Fall  . Headache  . Abdominal Cramping   HPI This is a 35yo [id number] at [redacted]w[redacted]d. Pregnancy complicated by MS, h/o preterm labor, IBS. She is followed by Dr George [facility]). She started having MS flare symptoms, which include feeling like her legs and arms are numb and on fire. Having some heaviness and weakness in her extremities. Started about 3 weeks ago. She was started on steroid taper on 1/7 without improvement (started at prednisone $RemoveBefor'60mg'MEBZgmrSMWLd$  and decreasing by $RemoveBefor'10mg'eZeSSNARtEmU$ ). Because of the MS symptoms, she's had several falls at home, including twice today. Last fall around 2pm. Some cramping, but no bleeding.  OB History    Gravida  7   Para  3   Term  0   Preterm  3   AB  3   Living  4     SAB      IAB  3   Ectopic      Multiple  1   Live Births  4        Obstetric Comments  Induced early due to Pre- E with all prenancies        Past Medical History:  Diagnosis Date  . Abnormal Pap smear    Colpo>normal  . Fibroid   . Gonorrhea   . Headache    last migraine 01/2017  . History of IBS    watch diet, no meds  . Infection    UTI  . Multiple sclerosis ([place])   . Neuromuscular disorder [facility])    diagnosed with MS this visit  . Obesity   . Ovarian cyst   . Pernicious anemia 05/02/2013  . Pregnancy induced hypertension   . Preterm labor    all 3 deliveries  . SVD (spontaneous vaginal delivery)    x 3  . Urinary tract infection   . Vaginal Pap smear, abnormal     Past Surgical History:  Procedure Laterality Date  . CHOLECYSTECTOMY N/A 11/25/2017   Procedure: LAPAROSCOPIC CHOLECYSTECTOMY WITH INTRAOPERATIVE CHOLANGIOGRAM;  Surgeon: Armandina Gemma, MD;  Location: [facility];  Service: General;  Laterality: N/A;  . COLPOSCOPY    . MYOMECTOMY N/A 04/01/2017   Procedure:  Vaginal Myomectomy;  Surgeon: Osborne Oman, MD;  Location: [facility];  Service: Gynecology;  Laterality: N/A;  . TUBAL LIGATION     2012/ reversal June 2020  . WISDOM TOOTH EXTRACTION      Family History  Problem Relation Age of Onset  . Diabetes Mother   . Hypertension Mother   . Diabetes Father   . Hypertension Father   . Kidney disease Father   . Stroke Father   . Learning disabilities Brother   . Anesthesia problems Neg Hx   . Other Neg Hx     Social History   Tobacco Use  . Smoking status: Never Smoker  . Smokeless tobacco: Never Used  Vaping Use  . Vaping Use: Never used  Substance Use Topics  . Alcohol use: No    Alcohol/week: 0.0 standard drinks    Comment:    . Drug use: No    Comment: denies use, states was given CBD gummies for nausea    Allergies: No Known Allergies  Medications Prior to Admission  Medication Sig Dispense Refill Last  Dose  . acetaminophen (TYLENOL) 325 MG tablet Take 650 mg by mouth every 6 (six) hours as needed.   06/26/2020 at Unknown time  . aspirin EC 81 MG tablet Take 1 tablet (81 mg total) by mouth daily. Take after 12 weeks for prevention of preeclampsia later in pregnancy 300 tablet 2 06/25/2020 at Unknown time  . promethazine (PHENERGAN) 12.5 MG tablet Take 1-2 tablets (12.5-25 mg total) by mouth every 6 (six) hours as needed for nausea or vomiting. 30 tablet 2 Past Week at Unknown time  . Blood Pressure Monitoring (BLOOD PRESSURE KIT) DEVI 1 kit by Does not apply route once a week. Check Blood Pressure regularly and record readings into the Babyscripts App.  Large Cuff.  DX O90.0 1 each 0   . Butalbital-APAP-Caffeine 50-325-40 MG capsule Take 1 capsule by mouth every 6 (six) hours as needed for headache. (Patient not taking: No sig reported) 30 capsule 0   . cyclobenzaprine (FLEXERIL) 10 MG tablet Take 1 tablet (10 mg total) by mouth 2 (two) times daily as needed for muscle spasms. (Patient not taking: Reported on 06/20/2020) 20 tablet 0    . docusate sodium (COLACE) 100 MG capsule Take 1 capsule (100 mg total) by mouth 2 (two) times daily as needed for mild constipation. (Patient not taking: No sig reported) 30 capsule 2   . Elastic Bandages & Supports (COMFORT FIT MATERNITY SUPP MED) MISC 1 Device by Does not apply route daily. 1 each 0   . metoCLOPramide (REGLAN) 10 MG tablet Take 1 tablet (10 mg total) by mouth 3 (three) times daily with meals. (Patient not taking: No sig reported) 90 tablet 0   . NIFEdipine (PROCARDIA) 10 MG capsule Take 1 capsule (10 mg total) by mouth 3 (three) times daily. (Patient not taking: Reported on 06/20/2020) 30 capsule 1   . ondansetron (ZOFRAN) 4 MG tablet Take 1 tablet (4 mg total) by mouth every 8 (eight) hours as needed for nausea or vomiting. (Patient not taking: No sig reported) 20 tablet 5   . prochlorperazine (COMPAZINE) 10 MG tablet Take 1 tablet (10 mg total) by mouth 2 (two) times daily as needed for nausea or vomiting. (Patient not taking: No sig reported) 30 tablet 0   . scopolamine (TRANSDERM-SCOP) 1 MG/3DAYS Place 1 patch (1.5 mg total) onto the skin every 3 (three) days. (Patient not taking: No sig reported) 10 patch 12     Review of Systems Physical Exam   Blood pressure (!) 122/55, pulse 98, temperature 98 F (36.7 C), temperature source Oral, resp. rate 18, last menstrual period 11/17/2019, SpO2 98 %, unknown if currently breastfeeding.  Physical Exam Vitals reviewed.  Constitutional:      Appearance: She is well-developed.  Cardiovascular:     Rate and Rhythm: Normal rate.     Heart sounds: Normal heart sounds.  Pulmonary:     Effort: Pulmonary effort is normal.     Breath sounds: Normal breath sounds.  Abdominal:     General: Bowel sounds are normal.     Palpations: Abdomen is soft.  Skin:    General: Skin is warm and dry.  Neurological:     Mental Status: She is alert.  Psychiatric:        Mood and Affect: Mood normal. Mood is not anxious or depressed.         Speech: Speech normal.        Behavior: Behavior normal. Behavior is not agitated.  Cognition and Memory: Cognition is not impaired. Memory is not impaired.     MAU Course  Procedures  MDM Check CBC, CMP, EKG, orthostatics. Patient anemic - will give Venofer $RemoveBefo'200mg'RDNrsjTTRDd$  now Spoke with Dr Rory Percy with neurology. Recommended IV solumedrol 1g now. Should be able to return home and follow up with outpt Neurologist.  Assessment and Plan     ICD-10-CM   1. [redacted] weeks gestation of pregnancy  Z3A.31   2. Hyperemesis affecting pregnancy, antepartum  O21.0   3. Supervision of other normal pregnancy, antepartum  Z34.80   4. Iron deficiency anemia secondary to inadequate dietary iron intake  D50.8   5. Multiple sclerosis affecting pregnancy in third trimester [facility])  O99.353    G35    Patient stable after infusions. Discharge to home. Follow up with primary OB and with neurologist.  Will likely need additional venofer infusions Return precautions given.   Truett Mainland 06/26/2020, 5:55 PM

## 2020-06-26 NOTE — Discharge Instructions (Signed)
Fall Prevention in the Home, Adult Falls can cause injuries and can happen to people of all ages. There are many things you can do to make your home safe and to help prevent falls. Ask for help when making these changes. What actions can I take to prevent falls? General Instructions  Use good lighting in all rooms. Replace any light bulbs that burn out.  Turn on the lights in dark areas. Use night-lights.  Keep items that you use often in easy-to-reach places. Lower the shelves around your home if needed.  Set up your furniture so you have a clear path. Avoid moving your furniture around.  Do not have throw rugs or other things on the floor that can make you trip.  Avoid walking on wet floors.  If any of your floors are uneven, fix them.  Add color or contrast paint or tape to clearly mark and help you see: ? Grab bars or handrails. ? First and last steps of staircases. ? Where the edge of each step is.  If you use a stepladder: ? Make sure that it is fully opened. Do not climb a closed stepladder. ? Make sure the sides of the stepladder are locked in place. ? Ask someone to hold the stepladder while you use it.  Know where your pets are when moving through your home. What can I do in the bathroom?  Keep the floor dry. Clean up any water on the floor right away.  Remove soap buildup in the tub or shower.  Use nonskid mats or decals on the floor of the tub or shower.  Attach bath mats securely with double-sided, nonslip rug tape.  If you need to sit down in the shower, use a plastic, nonslip stool.  Install grab bars by the toilet and in the tub and shower. Do not use towel bars as grab bars.      What can I do in the bedroom?  Make sure that you have a light by your bed that is easy to reach.  Do not use any sheets or blankets for your bed that hang to the floor.  Have a firm chair with side arms that you can use for support when you get dressed. What can I do in  the kitchen?  Clean up any spills right away.  If you need to reach something above you, use a step stool with a grab bar.  Keep electrical cords out of the way.  Do not use floor polish or wax that makes floors slippery. What can I do with my stairs?  Do not leave any items on the stairs.  Make sure that you have a light switch at the top and the bottom of the stairs.  Make sure that there are handrails on both sides of the stairs. Fix handrails that are broken or loose.  Install nonslip stair treads on all your stairs.  Avoid having throw rugs at the top or bottom of the stairs.  Choose a carpet that does not hide the edge of the steps on the stairs.  Check carpeting to make sure that it is firmly attached to the stairs. Fix carpet that is loose or worn. What can I do on the outside of my home?  Use bright outdoor lighting.  Fix the edges of walkways and driveways and fix any cracks.  Remove anything that might make you trip as you walk through a door, such as a raised step or threshold.  Trim any   bushes or trees on paths to your home.  Check to see if handrails are loose or broken and that both sides of all steps have handrails.  Install guardrails along the edges of any raised decks and porches.  Clear paths of anything that can make you trip, such as tools or rocks.  Have leaves, snow, or ice cleared regularly.  Use sand or salt on paths during winter.  Clean up any spills in your garage right away. This includes grease or oil spills. What other actions can I take?  Wear shoes that: ? Have a low heel. Do not wear high heels. ? Have rubber bottoms. ? Feel good on your feet and fit well. ? Are closed at the toe. Do not wear open-toe sandals.  Use tools that help you move around if needed. These include: ? Canes. ? Walkers. ? Scooters. ? Crutches.  Review your medicines with your doctor. Some medicines can make you feel dizzy. This can increase your chance  of falling. Ask your doctor what else you can do to help prevent falls. Where to find more information  Centers for Disease Control and Prevention, [employer]: www.cdc.gov  National Institute on Aging: www.nia.nih.gov Contact a doctor if:  You are afraid of falling at home.  You feel weak, drowsy, or dizzy at home.  You fall at home. Summary  There are many simple things that you can do to make your home safe and to help prevent falls.  Ways to make your home safe include removing things that can make you trip and installing grab bars in the bathroom.  Ask for help when making these changes in your home. This information is not intended to replace advice given to you by your health care provider. Make sure you discuss any questions you have with your health care provider. Document Revised: 01/03/2020 Document Reviewed: 01/03/2020 Elsevier Patient Education  2021 Elsevier Inc.  

## 2020-06-26 NOTE — MAU Note (Addendum)
Pt reports falling 2x today when getting up from bed. Fell at [employer] and 1430. Hit right side of abdomen both times. Seeing spots, has headache and abdominal cramping. Denies cntrx, or VB. +FM. Has MS.

## 2020-07-03 ENCOUNTER — Other Ambulatory Visit: Payer: Self-pay | Admitting: *Deleted

## 2020-07-03 MED ORDER — FAMOTIDINE 20 MG PO TABS: 20.0000 mg | ORAL_TABLET | Freq: Two times a day (BID) | ORAL | 2 refills | Status: DC

## 2020-07-03 NOTE — Progress Notes (Signed)
Pt called to office for Rx for heartburn. Pt states she has taken OTC meds with no relief. Pepcid 20mg  ordered today pre protocol.

## 2020-07-05 ENCOUNTER — Ambulatory Visit (INDEPENDENT_AMBULATORY_CARE_PROVIDER_SITE_OTHER): Payer: Medicare HMO | Admitting: Obstetrics & Gynecology

## 2020-07-05 ENCOUNTER — Other Ambulatory Visit: Payer: Self-pay

## 2020-07-05 VITALS — BP 132/81 | HR 92 | Wt 262.0 lb

## 2020-07-05 DIAGNOSIS — Z3A33 33 weeks gestation of pregnancy: Secondary | ICD-10-CM

## 2020-07-05 DIAGNOSIS — G35 Multiple sclerosis: Secondary | ICD-10-CM

## 2020-07-05 DIAGNOSIS — O283 Abnormal ultrasonic finding on antenatal screening of mother: Secondary | ICD-10-CM

## 2020-07-05 DIAGNOSIS — O99353 Diseases of the nervous system complicating pregnancy, third trimester: Secondary | ICD-10-CM

## 2020-07-05 DIAGNOSIS — D508 Other iron deficiency anemias: Secondary | ICD-10-CM

## 2020-07-05 DIAGNOSIS — O09299 Supervision of pregnancy with other poor reproductive or obstetric history, unspecified trimester: Secondary | ICD-10-CM

## 2020-07-05 DIAGNOSIS — Z3480 Encounter for supervision of other normal pregnancy, unspecified trimester: Secondary | ICD-10-CM

## 2020-07-05 NOTE — Progress Notes (Signed)
ROB, c/o complications with her MS and nerves which results in her seeing spots, dizziness, HA.

## 2020-07-05 NOTE — Progress Notes (Addendum)
PRENATAL VISIT NOTE  Subjective:  [name] is a 35 y.o. [id number] at [redacted]w[redacted]d being seen today for ongoing prenatal care.  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  Patient reports she's having more MS type symptoms--visual spots, tingling in lower extremities of arms/legs.  She does sometimes use a walker.  She does have home health.  Pt contacted her neurologist at [facility], Dr. George Hugh, and prednisone taper was started.  Pt did not feel this did anything for her and symptoms worsened.  She had several falls at home.  She decided to be seen in the MAU on 06/23/2020.  Dr. Rory Percy with neurology was consulted.  1g IV solumedrol was given as well as venofer 200mg .  Pt feels like this helped but is anxious about not having a plan for what to do next.  Has stopped the prednisone taper.  She has an appointment with Dr. Mickel Baas on 07/23/2020.   Contractions: Irregular. Vag. Bleeding: None.  Movement: Present. Denies leaking of fluid.   The following portions of the patient's history were reviewed and updated as appropriate: allergies, current medications, past family history, past medical history, past social history, past surgical history and problem list.   Objective:   Vitals:   07/05/20 1035  BP: 132/81  Pulse: 92  Weight: 262 lb (118.8 kg)    Fetal Status: Fetal Heart Rate (bpm): 135    Movement: Present     General:  Alert, oriented and cooperative. Patient is in no acute distress.  Skin: Skin is warm and dry. No rash noted.   Cardiovascular: Normal heart rate noted  Respiratory: Normal respiratory effort, no problems with respiration noted  Abdomen: Soft, gravid, appropriate for gestational age.  Pain/Pressure: Present     Pelvic: Cervical exam deferred        Extremities: Normal range of motion.  Edema: None  Mental Status: Normal mood and affect. Normal behavior. Normal judgment and thought content.   Assessment and Plan:  Pregnancy: [id number] at [redacted]w[redacted]d 1. [redacted] weeks gestation of pregnancy - on PNV and baby ASA  2. Supervision of other normal pregnancy, antepartum  3. Multiple sclerosis affecting pregnancy in third trimester [facility]) - Called Dr. George Hugh regarding pt's concerns about worsening MS and increased falls prior to receiving IV solumedrol on 06/26/2020.  She is not in the office today.  Spoke with RN who will relay message and I asked for return call so we can develop a plan for the next several weeks. - pt does not want future pregnancy.  Husband planning vasectomy.  4. Hx of preeclampsia, prior pregnancy, currently pregnant - trace protein on dip u/a today  5. Iron deficiency anemia secondary to inadequate dietary iron intake - had iron infusion 06/26/2020.   6.  Abnormal prenatal ultrasound (calcification in abdomen) - negative CMV, toxoplasmosis testing 04/08/2020  - next growth scan scheduled for 07/19/2020  Preterm labor symptoms and general obstetric precautions including but not limited to vaginal bleeding, contractions, leaking of fluid and fetal movement were reviewed in detail  with the patient. Please refer to After Visit Summary for other counseling recommendations.   Return in about 2 weeks (around 07/19/2020).  Future Appointments  Date Time Provider [place]  07/19/2020  7:30 AM [facility] US3 [facility]  07/19/2020 11:00 AM Clarnce Flock, MD [facility] None    Megan Salon, MD

## 2020-07-06 LAB — OB RESULTS CONSOLE GBS: GBS: NEGATIVE

## 2020-07-11 ENCOUNTER — Other Ambulatory Visit: Payer: Self-pay

## 2020-07-11 ENCOUNTER — Encounter (HOSPITAL_COMMUNITY): Payer: Self-pay | Admitting: Family Medicine

## 2020-07-11 ENCOUNTER — Observation Stay (HOSPITAL_COMMUNITY)
Admission: AD | Admit: 2020-07-11 | Discharge: 2020-07-12 | Disposition: A | Discharge status: Home / Self Care | Payer: Medicare HMO | Attending: Obstetrics & Gynecology | Admitting: Obstetrics & Gynecology

## 2020-07-11 DIAGNOSIS — O99213 Obesity complicating pregnancy, third trimester: Secondary | ICD-10-CM

## 2020-07-11 DIAGNOSIS — O0993 Supervision of high risk pregnancy, unspecified, third trimester: Secondary | ICD-10-CM | POA: Insufficient documentation

## 2020-07-11 DIAGNOSIS — O99342 Other mental disorders complicating pregnancy, second trimester: Secondary | ICD-10-CM | POA: Diagnosis not present

## 2020-07-11 DIAGNOSIS — K831 Obstruction of bile duct: Secondary | ICD-10-CM

## 2020-07-11 DIAGNOSIS — O9A213 Injury, poisoning and certain other consequences of external causes complicating pregnancy, third trimester: Secondary | ICD-10-CM | POA: Diagnosis present

## 2020-07-11 DIAGNOSIS — Z8759 Personal history of other complications of pregnancy, childbirth and the puerperium: Secondary | ICD-10-CM

## 2020-07-11 DIAGNOSIS — W01198A Fall on same level from slipping, tripping and stumbling with subsequent striking against other object, initial encounter: Secondary | ICD-10-CM | POA: Diagnosis not present

## 2020-07-11 DIAGNOSIS — Z6841 Body Mass Index (BMI) 40.0 and over, adult: Secondary | ICD-10-CM | POA: Insufficient documentation

## 2020-07-11 DIAGNOSIS — O26619 Liver and biliary tract disorders in pregnancy, unspecified trimester: Secondary | ICD-10-CM

## 2020-07-11 DIAGNOSIS — R1010 Upper abdominal pain, unspecified: Secondary | ICD-10-CM | POA: Diagnosis not present

## 2020-07-11 DIAGNOSIS — O09299 Supervision of pregnancy with other poor reproductive or obstetric history, unspecified trimester: Secondary | ICD-10-CM

## 2020-07-11 DIAGNOSIS — O99353 Diseases of the nervous system complicating pregnancy, third trimester: Secondary | ICD-10-CM | POA: Diagnosis not present

## 2020-07-11 DIAGNOSIS — F419 Anxiety disorder, unspecified: Secondary | ICD-10-CM

## 2020-07-11 DIAGNOSIS — G35 Multiple sclerosis: Secondary | ICD-10-CM | POA: Diagnosis not present

## 2020-07-11 DIAGNOSIS — R200 Anesthesia of skin: Secondary | ICD-10-CM | POA: Diagnosis not present

## 2020-07-11 DIAGNOSIS — Z20822 Contact with and (suspected) exposure to covid-19: Secondary | ICD-10-CM | POA: Insufficient documentation

## 2020-07-11 DIAGNOSIS — O26891 Other specified pregnancy related conditions, first trimester: Secondary | ICD-10-CM | POA: Diagnosis not present

## 2020-07-11 DIAGNOSIS — O283 Abnormal ultrasonic finding on antenatal screening of mother: Secondary | ICD-10-CM | POA: Diagnosis present

## 2020-07-11 DIAGNOSIS — O0990 Supervision of high risk pregnancy, unspecified, unspecified trimester: Secondary | ICD-10-CM

## 2020-07-11 DIAGNOSIS — O99210 Obesity complicating pregnancy, unspecified trimester: Secondary | ICD-10-CM | POA: Diagnosis present

## 2020-07-11 LAB — URINALYSIS, ROUTINE W REFLEX MICROSCOPIC
Bilirubin Urine: NEGATIVE
Glucose, UA: NEGATIVE mg/dL
Hgb urine dipstick: NEGATIVE
Ketones, ur: NEGATIVE mg/dL
Leukocytes,Ua: NEGATIVE
Nitrite: NEGATIVE
Protein, ur: 30 mg/dL — AB
Specific Gravity, Urine: 1.019 (ref 1.005–1.030)
pH: 6 (ref 5.0–8.0)

## 2020-07-11 LAB — CBC WITH DIFFERENTIAL/PLATELET
Abs Immature Granulocytes: 0.05 10*3/uL (ref 0.00–0.07)
Basophils Absolute: 0 10*3/uL (ref 0.0–0.1)
Basophils Relative: 0 %
Eosinophils Absolute: 0 10*3/uL (ref 0.0–0.5)
Eosinophils Relative: 0 %
HCT: 31.8 % — ABNORMAL LOW (ref 36.0–46.0)
Hemoglobin: 9.8 g/dL — ABNORMAL LOW (ref 12.0–15.0)
Immature Granulocytes: 1 %
Lymphocytes Relative: 17 %
Lymphs Abs: 1.4 10*3/uL (ref 0.7–4.0)
MCH: 29.2 pg (ref 26.0–34.0)
MCHC: 30.8 g/dL (ref 30.0–36.0)
MCV: 94.6 fL (ref 80.0–100.0)
Monocytes Absolute: 0.5 10*3/uL (ref 0.1–1.0)
Monocytes Relative: 7 %
Neutro Abs: 6.2 10*3/uL (ref 1.7–7.7)
Neutrophils Relative %: 75 %
Platelets: 341 10*3/uL (ref 150–400)
RBC: 3.36 MIL/uL — ABNORMAL LOW (ref 3.87–5.11)
RDW: 14.8 % (ref 11.5–15.5)
WBC: 8.2 10*3/uL (ref 4.0–10.5)
nRBC: 0 % (ref 0.0–0.2)

## 2020-07-11 LAB — COMPREHENSIVE METABOLIC PANEL
ALT: 9 U/L (ref 0–44)
AST: 14 U/L — ABNORMAL LOW (ref 15–41)
Albumin: 2.7 g/dL — ABNORMAL LOW (ref 3.5–5.0)
Alkaline Phosphatase: 66 U/L (ref 38–126)
Anion gap: 10 (ref 5–15)
BUN: <5 mg/dL — ABNORMAL LOW (ref 6–20)
CO2: 20 mmol/L — ABNORMAL LOW (ref 22–32)
Calcium: 8.5 mg/dL — ABNORMAL LOW (ref 8.9–10.3)
Chloride: 104 mmol/L (ref 98–111)
Creatinine, Ser: 0.61 mg/dL (ref 0.44–1.00)
GFR, Estimated: >60 mL/min (ref >60)
Glucose, Bld: 113 mg/dL — ABNORMAL HIGH (ref 70–99)
Potassium: 3.6 mmol/L (ref 3.5–5.1)
Sodium: 134 mmol/L — ABNORMAL LOW (ref 135–145)
Total Bilirubin: 0.6 mg/dL (ref 0.3–1.2)
Total Protein: 6.1 g/dL — ABNORMAL LOW (ref 6.5–8.1)

## 2020-07-11 LAB — MAGNESIUM: Magnesium: 1.8 mg/dL (ref 1.7–2.4)

## 2020-07-11 LAB — WET PREP, GENITAL
Clue Cells Wet Prep HPF POC: NONE SEEN
Sperm: NONE SEEN
Trich, Wet Prep: NONE SEEN
Yeast Wet Prep HPF POC: NONE SEEN

## 2020-07-11 LAB — TYPE AND SCREEN
ABO/RH(D): A POS
Antibody Screen: NEGATIVE

## 2020-07-11 LAB — SARS CORONAVIRUS 2 BY RT PCR (HOSPITAL ORDER, PERFORMED IN ~~LOC~~ HOSPITAL LAB): SARS Coronavirus 2: NEGATIVE

## 2020-07-11 MED ORDER — PRENATAL MULTIVITAMIN CH
1.0000 | ORAL_TABLET | Freq: Every day | ORAL | Status: DC
  Administered 2020-07-11: 1 via ORAL
  Filled 2020-07-11: qty 1

## 2020-07-11 MED ORDER — SODIUM CHLORIDE 0.9 % IV SOLN
500.0000 mg | Freq: Once | INTRAVENOUS | Status: AC
  Administered 2020-07-11: 500 mg via INTRAVENOUS
  Filled 2020-07-11: qty 25

## 2020-07-11 MED ORDER — CALCIUM CARBONATE ANTACID 500 MG PO CHEW: 2.0000 | CHEWABLE_TABLET | ORAL | Status: DC | PRN

## 2020-07-11 MED ORDER — FAMOTIDINE 20 MG PO TABS
20.0000 mg | ORAL_TABLET | Freq: Two times a day (BID) | ORAL | Status: DC
  Administered 2020-07-11 (×2): 20 mg via ORAL
  Filled 2020-07-11 (×2): qty 1

## 2020-07-11 MED ORDER — SODIUM CHLORIDE 0.9 % IV SOLN
1000.0000 mg | Freq: Once | INTRAVENOUS | Status: AC
  Administered 2020-07-11: 1000 mg via INTRAVENOUS
  Filled 2020-07-11: qty 8

## 2020-07-11 MED ORDER — ASPIRIN EC 81 MG PO TBEC
81.0000 mg | DELAYED_RELEASE_TABLET | Freq: Every day | ORAL | Status: DC
  Administered 2020-07-11: 81 mg via ORAL
  Filled 2020-07-11 (×2): qty 1

## 2020-07-11 MED ORDER — ZOLPIDEM TARTRATE 5 MG PO TABS
5.0000 mg | ORAL_TABLET | Freq: Every evening | ORAL | Status: DC | PRN
  Administered 2020-07-11: 5 mg via ORAL
  Filled 2020-07-11: qty 1

## 2020-07-11 MED ORDER — PROMETHAZINE HCL 25 MG PO TABS: 12.5000 mg | ORAL_TABLET | Freq: Four times a day (QID) | ORAL | Status: DC | PRN

## 2020-07-11 MED ORDER — ACETAMINOPHEN 325 MG PO TABS
650.0000 mg | ORAL_TABLET | ORAL | Status: DC | PRN
  Administered 2020-07-11 – 2020-07-12 (×3): 650 mg via ORAL
  Filled 2020-07-11 (×3): qty 2

## 2020-07-11 MED ORDER — DOCUSATE SODIUM 100 MG PO CAPS
100.0000 mg | ORAL_CAPSULE | Freq: Every day | ORAL | Status: DC
  Administered 2020-07-11: 100 mg via ORAL
  Filled 2020-07-11: qty 1

## 2020-07-11 NOTE — H&P (Addendum)
[name] is a 35 y.o. female [redacted]w[redacted]d based on LMP presenting for abdominal pain after a fall. Her PMH is significant for acute relapsing MS, obesity, preterm labor, hx of preeclampsia, endometriosis and anxiety. She states this morning around 0800 she fell and hit her mid-abdomen on a dresser. She said she laid down to rest after and when she woke up she started having abdominal pain and noticed vaginal blood tinged discharge only when wiping. She has had multiple falls over the past 3 weeks due to relapse of MS causing numbness in her upper and lower extermities. She was seen in the MAU on 1/12 for a fall and received IV solumedrol for MS and venofer for IDA with hemoglobin of 8.7. Abdominal cramping is on the mid and upper abdomen, intermittent and rated 7 out 10 on pain scale. Positive fetal movements. She also complains of itching on extremities and abdomen for the past week. She has also had heartburn which she takes pepcid for. Denies fever, chills, nausea, vomiting, urinary symptoms, recent illness or LOF.   OB History    Gravida  7   Para  3   Term  0   Preterm  3   AB  3   Living  4     SAB      IAB  3   Ectopic      Multiple  1   Live Births  4        Obstetric Comments  Induced early due to Pre- E with all prenancies       Past Medical History:  Diagnosis Date  . Abnormal Pap smear    Colpo>normal  . Fibroid   . Gonorrhea   . Headache    last migraine 01/2017  . History of IBS    watch diet, no meds  . Infection    UTI  . Multiple sclerosis ([place])   . Neuromuscular disorder [facility])    diagnosed with MS this visit  . Obesity   . Ovarian cyst   . Pernicious anemia 05/02/2013  . Pregnancy induced hypertension   . Preterm labor    all 3 deliveries  . SVD (spontaneous vaginal delivery)    x 3  . Urinary tract infection   . Vaginal Pap smear, abnormal    Past Surgical History:  Procedure Laterality Date  . CHOLECYSTECTOMY N/A 11/25/2017   Procedure:  LAPAROSCOPIC CHOLECYSTECTOMY WITH INTRAOPERATIVE CHOLANGIOGRAM;  Surgeon: Armandina Gemma, MD;  Location: [facility];  Service: General;  Laterality: N/A;  . COLPOSCOPY    . MYOMECTOMY N/A 04/01/2017   Procedure: Vaginal Myomectomy;  Surgeon: Osborne Oman, MD;  Location: [facility];  Service: Gynecology;  Laterality: N/A;  . TUBAL LIGATION     2012/ reversal June 2020  . WISDOM TOOTH EXTRACTION     Family History: family history includes Diabetes in her father and mother; Hypertension in her father and mother; Kidney disease in her father; Learning disabilities in her brother; Stroke in her father. Social History:  reports that she has never smoked. She has never used smokeless tobacco. She reports that she does not drink alcohol and does not use drugs.     Maternal Diabetes: No Genetic Screening: Normal Maternal Ultrasounds/Referrals: Other: abdomen calcifications, negative CMV and toxoplasmosis screenings Fetal Ultrasounds or other Referrals:  Other: MFM following abdominal calcifications with repeat US Maternal Substance Abuse:  Yes:  Type: Marijuana Significant Maternal Medications:  None Significant Maternal Lab Results:  None, GBS pending Other  Comments:  None  Review of Systems  Constitutional: Negative for chills, diaphoresis and fever.  Respiratory: Negative.   Cardiovascular: Negative.   Gastrointestinal: Positive for abdominal pain. Negative for nausea and vomiting.  Genitourinary: Negative for difficulty urinating, dysuria, flank pain, urgency and vaginal discharge.  Skin: Negative for color change and rash.  Neurological: Positive for weakness, numbness and headaches. Negative for facial asymmetry and speech difficulty.   Maternal Medical History:  Reason for admission: Nausea.    Dilation: Closed Effacement (%): Thick Station: Ballotable Exam by:: Dr. Harolyn Rutherford Blood pressure 114/61, pulse 89, temperature 98.2 F (36.8 C), temperature source Oral, resp. rate 19, last  menstrual period 11/17/2019, SpO2 97 %, unknown if currently breastfeeding. Exam Physical Exam Exam conducted with a chaperone present.  Constitutional:      General: She is not in acute distress.    Appearance: She is obese.  Cardiovascular:     Rate and Rhythm: Normal rate and regular rhythm.     Heart sounds: No murmur heard.   Pulmonary:     Effort: Pulmonary effort is normal. No respiratory distress.     Breath sounds: Normal breath sounds. No wheezing or rales.  Abdominal:     General: Bowel sounds are normal.     Tenderness: There is no abdominal tenderness. There is no guarding or rebound.  Genitourinary:    Vagina: No bleeding.     Cervix: No cervical bleeding.  Musculoskeletal:        General: No tenderness or signs of injury. Normal range of motion.  Skin:    General: Skin is warm and dry.     Findings: No rash.  Neurological:     Mental Status: She is alert and oriented to person, place, and time.  Psychiatric:        Mood and Affect: Mood normal.        Behavior: Behavior normal.    Pelvic exam: Cervix pink, visually closed, without lesion, scant white creamy discharge, vaginal walls and external genitalia normal, no blood visualized  Results for orders placed or performed during the hospital encounter of 07/11/20 (from the past 24 hour(s))  Wet prep, genital     Status: Abnormal   Collection Time: 07/11/20  1:14 PM   Specimen: Cervix  Result Value Ref Range   Yeast Wet Prep HPF POC NONE SEEN NONE SEEN   Trich, Wet Prep NONE SEEN NONE SEEN   Clue Cells Wet Prep HPF POC NONE SEEN NONE SEEN   WBC, Wet Prep HPF POC MANY (A) NONE SEEN   Sperm NONE SEEN   CBC with Differential/Platelet     Status: Abnormal   Collection Time: 07/11/20  1:23 PM  Result Value Ref Range   WBC 8.2 4.0 - 10.5 K/uL   RBC 3.36 (L) 3.87 - 5.11 MIL/uL   Hemoglobin 9.8 (L) 12.0 - 15.0 g/dL   HCT 31.8 (L) 36.0 - 46.0 %   MCV 94.6 80.0 - 100.0 fL   MCH 29.2 26.0 - 34.0 pg   MCHC 30.8  30.0 - 36.0 g/dL   RDW 14.8 11.5 - 15.5 %   Platelets 341 150 - 400 K/uL   nRBC 0.0 0.0 - 0.2 %   Neutrophils Relative % 75 %   Neutro Abs 6.2 1.7 - 7.7 K/uL   Lymphocytes Relative 17 %   Lymphs Abs 1.4 0.7 - 4.0 K/uL   Monocytes Relative 7 %   Monocytes Absolute 0.5 0.1 - 1.0 K/uL   Eosinophils  Relative 0 %   Eosinophils Absolute 0.0 0.0 - 0.5 K/uL   Basophils Relative 0 %   Basophils Absolute 0.0 0.0 - 0.1 K/uL   Immature Granulocytes 1 %   Abs Immature Granulocytes 0.05 0.00 - 0.07 K/uL  Type and screen     Status: None   Collection Time: 07/11/20  1:23 PM  Result Value Ref Range   ABO/RH(D) A POS    Antibody Screen NEG    Sample Expiration      07/14/2020,2359 Performed at [facility], [place] [address], [place], [place] [address]      Prenatal labs: ABO, Rh: --/--/A POS (01/27 1323) Antibody: NEG (01/27 1323) Rubella: 1.63 (09/13 1135) RPR: Non Reactive (12/20 1046)  HBsAg: Negative (09/13 1135)  HIV: Non Reactive (12/20 1046)  GBS:   pending  Assessment/Plan: Principal Problem:   Fall on gravid abdomen in third trimester Active Problems:   Multiple sclerosis affecting pregnancy in third trimester [facility])   Supervision of high-risk pregnancy   Hx of preeclampsia, prior pregnancy, currently pregnant   Maternal morbid obesity, antepartum ([place])   Patient will be admitted to [facility] speciality care for at least overnight observation and fetal monitoring. Will recheck CBC due to history of IDA with need for iron infusions. COVID swab pending. UA, GC/chlamydia, wet prep to r/o infection. GBS collected due to history of preterm labor. CMP and bile acids ordered d/t pruritis. Will monitor magnesium due to history of abnormalities. Type and screen and RPR also pending. Will consult with neurology about MS treatment.  Routine antenatal care.  Jake Michaelis, PA Student 07/11/2020, 2:31 PM   Attestation of Attending Supervision of Student:  I confirm that I have verified the  information documented in the physician assistant student's note and that I have also personally reperformed the history, physical exam and all medical decision making activities.  I have verified that all services and findings are accurately documented in this student's note; and I agree with management and plan as outlined in the documentation. I have also made any necessary editorial changes.  Plan discussed with patient, needs observation and continuous fetal monitoring.  No current signs/symptoms of abruption or PTL.  Category I FHR tracing.  Hemoglobin is 9.8, Venofer ordered for patient after counseling her. Will follow up pending labs and manage accordingly. Consulted Neurology (Dr. Curly Sh[place]), she recommended repeat Solumedrol 1 g IV x 1 dose for now which was ordered, and patient is already scheduled to follow up with her neurologist at [place] next week for further evaluation and management.  Will continue routine antenatal care.   Verita Schneiders, MD, [place] Attending [place], [facility] for [employer], [place]

## 2020-07-11 NOTE — MAU Note (Signed)
Presents with c/o fall this morning @ 0800 this morning.  States has Hx of MS, causes her to fall.  Reports fell, struck abdomen on her dresser.  States laid down but when she woke up noticed she had spotting with wiping and was cramping.  Endorses +FM.

## 2020-07-11 NOTE — Plan of Care (Signed)
  Problem: Education: Goal: Knowledge of disease or condition will improve Outcome: Completed/Met   Problem: Education: Goal: Knowledge of General Education information will improve Description: Including pain rating scale, medication(s)/side effects and non-pharmacologic comfort measures Outcome: Completed/Met   Problem: Activity: Goal: Risk for activity intolerance will decrease Outcome: Completed/Met   Problem: Nutrition: Goal: Adequate nutrition will be maintained Outcome: Completed/Met   Problem: Coping: Goal: Level of anxiety will decrease Outcome: Completed/Met   Problem: Elimination: Goal: Will not experience complications related to urinary retention Outcome: Completed/Met

## 2020-07-11 NOTE — MAU Note (Signed)
Urine in lobby

## 2020-07-12 ENCOUNTER — Encounter: Payer: Self-pay | Admitting: Obstetrics & Gynecology

## 2020-07-12 ENCOUNTER — Encounter (HOSPITAL_COMMUNITY): Payer: Self-pay | Admitting: Obstetrics & Gynecology

## 2020-07-12 ENCOUNTER — Other Ambulatory Visit: Payer: Self-pay | Admitting: Obstetrics & Gynecology

## 2020-07-12 DIAGNOSIS — K831 Obstruction of bile duct: Secondary | ICD-10-CM | POA: Insufficient documentation

## 2020-07-12 DIAGNOSIS — O99353 Diseases of the nervous system complicating pregnancy, third trimester: Secondary | ICD-10-CM | POA: Diagnosis not present

## 2020-07-12 DIAGNOSIS — O9A213 Injury, poisoning and certain other consequences of external causes complicating pregnancy, third trimester: Secondary | ICD-10-CM | POA: Diagnosis not present

## 2020-07-12 DIAGNOSIS — W19XXXA Unspecified fall, initial encounter: Secondary | ICD-10-CM | POA: Diagnosis not present

## 2020-07-12 DIAGNOSIS — G35 Multiple sclerosis: Secondary | ICD-10-CM

## 2020-07-12 DIAGNOSIS — O26853 Spotting complicating pregnancy, third trimester: Secondary | ICD-10-CM

## 2020-07-12 DIAGNOSIS — O26643 Intrahepatic cholestasis of pregnancy, third trimester: Secondary | ICD-10-CM | POA: Insufficient documentation

## 2020-07-12 DIAGNOSIS — O26619 Liver and biliary tract disorders in pregnancy, unspecified trimester: Secondary | ICD-10-CM

## 2020-07-12 DIAGNOSIS — O283 Abnormal ultrasonic finding on antenatal screening of mother: Secondary | ICD-10-CM | POA: Diagnosis present

## 2020-07-12 DIAGNOSIS — Z3A33 33 weeks gestation of pregnancy: Secondary | ICD-10-CM

## 2020-07-12 HISTORY — DX: Intrahepatic cholestasis of pregnancy, third trimester: O26.643

## 2020-07-12 LAB — BILE ACIDS, TOTAL: Bile Acids Total: 13.8 umol/L — ABNORMAL HIGH (ref 0.0–10.0)

## 2020-07-12 LAB — GC/CHLAMYDIA PROBE AMP (~~LOC~~) NOT AT ARMC
Chlamydia: NEGATIVE
Comment: NEGATIVE
Comment: NORMAL
Neisseria Gonorrhea: NEGATIVE

## 2020-07-12 LAB — RPR: RPR Ser Ql: NONREACTIVE

## 2020-07-12 MED ORDER — URSODIOL 500 MG PO TABS: 500.0000 mg | ORAL_TABLET | Freq: Two times a day (BID) | ORAL | 3 refills | Status: DC

## 2020-07-12 MED ORDER — HYDROXYZINE HCL 25 MG PO TABS: 25.0000 mg | ORAL_TABLET | Freq: Four times a day (QID) | ORAL | 2 refills | Status: DC | PRN

## 2020-07-12 NOTE — Discharge Instructions (Signed)
Placental Abruption  The placenta is the organ formed during pregnancy. It carries oxygen and nutrients to the unborn baby (fetus). The placenta is the baby's life support system. Normally, it remains attached to the inside of the uterus until the baby is born. Placental abruption is a condition in which the placenta partly or completely separates from the uterus before the baby is born. Placental abruption is rare, but it can happen any time after 20 weeks of pregnancy. A small separation may not cause problems, but a large separation may be dangerous for you and your baby. A large separation is usually an emergency that requires treatment right away. What are the causes? In most cases, the cause of this condition is not known. What increases the risk? This condition is more likely to develop in women who:  Have experienced a recent fall, an injury to the abdomen, or a car accident.  Have had a previous placental abruption.  Have high blood pressure.  Smoke cigarettes, use alcohol, or use drugs, such as cocaine.  Are pregnant with more than one baby, such as twins or triplets.  Are 32 years of age or older. What are the signs or symptoms? A small placental abruption may not cause symptoms, or it may cause mild symptoms, which may include:  Mild pain in the abdomen or lower back.  Slight bleeding in the vagina. A severe placental abruption will cause symptoms. The symptoms will depend on the size of the separation and the stage of pregnancy. These may include:  Severe pain in the abdomen or lower back.  Bleeding from the vagina.  Ongoing contractions of your uterus with little or no relaxation of your uterus between contractions. How is this diagnosed? There are no tests to diagnose placental abruption. However, your health care provider may suspect that you have this condition based on:  Your symptoms.  A physical exam.  An ultrasound.  Blood tests.  Urine test. How is  this treated? Treatment for placental abruption depends on the severity of the condition.  Mild cases may be monitored through close observation. You may be admitted to the hospital during this time.  Severe cases may require emergency treatment. This may involve: ? Admission to the hospital. ? Emergency cesarean delivery of your baby. ? A blood transfusion or other fluids given through an IV. Follow these instructions at home: Activity  Get plenty of rest and sleep.  Do not have sex until your health care provider says it is okay. Lifestyle  Do not use drugs.  Do not drink alcohol.  Do not use tampons or douche unless your health care provider says it is okay.  Do not use any products that contain nicotine or tobacco. These products include cigarettes, chewing tobacco, and vaping devices, such as e-cigarettes. If you need help quitting, ask your health care provider General instructions  Take over-the-counter and prescription medicines only as told by your health care provider.  Do not take any medicines that your health care provider has not approved.  Arrange for help at home so that you can get enough rest.  Keep all follow-up visits. This is important. Contact a health care provider if:  You have vaginal spotting.  You are having mild, regular contractions. Get help right away if:  You have moderate or heavy vaginal bleeding or spotting.  You have severe pain in the abdomen.  You have continuous uterine contractions.  You have a hard, tender uterus.  You have any type of  trauma, such as an injury to the abdomen, a fall, or a car accident.  You do not feel the baby move, or the baby moves very little. Summary  Placental abruption is a condition in which the placenta partly or completely separates from the uterus before the baby is born.  Placental abruption is a medical emergency that requires treatment right away.  Contact a health care provider if you  have vaginal bleeding, or if you have mild, regular contractions.  Get help right away if you have heavy vaginal bleeding, severe pain in the abdomen, continuous uterine contractions, or you do not feel the baby move.  Keep all follow-up visits. This is important. This information is not intended to replace advice given to you by your health care provider. Make sure you discuss any questions you have with your health care provider. Document Revised: 01/22/2020 Document Reviewed: 01/22/2020 Elsevier Patient Education  [place]. Fetal Movement Counts Patient Name: ________________________________________________ Patient Due Date: ____________________  What is a fetal movement count? A fetal movement count is the number of times that you feel your baby move during a certain amount of time. This may also be called a fetal kick count. A fetal movement count is recommended for every pregnant woman. You may be asked to start counting fetal movements as early as week 28 of your pregnancy. Pay attention to when your baby is most active. You may notice your baby's sleep and wake cycles. You may also notice things that make your baby move more. You should do a fetal movement count:  When your baby is normally most active.  At the same time each day. A good time to count movements is while you are resting, after having something to eat and drink. How do I count fetal movements? 1. Find a quiet, comfortable area. Sit, or lie down on your side. 2. Write down the date, the start time and stop time, and the number of movements that you felt between those two times. Take this information with you to your health care visits. 3. Write down your start time when you feel the first movement. 4. Count kicks, flutters, swishes, rolls, and jabs. You should feel at least 10 movements. 5. You may stop counting after you have felt 10 movements, or if you have been counting for 2 hours. Write down the stop  time. 6. If you do not feel 10 movements in 2 hours, contact your health care provider for further instructions. Your health care provider may want to do additional tests to assess your baby's well-being. Contact a health care provider if:  You feel fewer than 10 movements in 2 hours.  Your baby is not moving like he or she usually does. Date: ____________ Start time: ____________ Stop time: ____________ Movements: ____________ Date: ____________ Start time: ____________ Stop time: ____________ Movements: ____________ Date: ____________ Start time: ____________ Stop time: ____________ Movements: ____________ Date: ____________ Start time: ____________ Stop time: ____________ Movements: ____________ Date: ____________ Start time: ____________ Stop time: ____________ Movements: ____________ Date: ____________ Start time: ____________ Stop time: ____________ Movements: ____________ Date: ____________ Start time: ____________ Stop time: ____________ Movements: ____________ Date: ____________ Start time: ____________ Stop time: ____________ Movements: ____________ Date: ____________ Start time: ____________ Stop time: ____________ Movements: ____________ This information is not intended to replace advice given to you by your health care provider. Make sure you discuss any questions you have with your health care provider. Document Revised: 01/19/2019 Document Reviewed: 01/19/2019 Elsevier Patient Education  2021 Elsevier  Inc. ° °

## 2020-07-12 NOTE — Progress Notes (Signed)
Patient reported diffuse pruritus during her admission on 1/27-1/28, normal LFTS on admission. Bile acids resulted  with elevated value of 13.8, this occurred after patient had already been discharged.  Tried to call patient, it did not go through.  This is concerning for cholestasis of pregnancy.  Ursodiol and Atarax prescribed as per protocol. She will need weekly antenatal testing. Has growth scan on 07/19/2020, added on BPP to that day. Delivery indicated at 37-38 weeks.  Also has OB visit with Dr. Dione Plover on 07/19/20. MyChart message sent to patient. Problem list updated.  Verita Schneiders, MD

## 2020-07-12 NOTE — Plan of Care (Signed)
  Problem: Education: Goal: Knowledge of the prescribed therapeutic regimen will improve Outcome: Adequate for Discharge Goal: Individualized Educational Video(s) Outcome: Adequate for Discharge   Problem: Clinical Measurements: Goal: Complications related to the disease process, condition or treatment will be avoided or minimized Outcome: Adequate for Discharge   Problem: Health Behavior/Discharge Planning: Goal: Ability to manage health-related needs will improve Outcome: Adequate for Discharge   Problem: Clinical Measurements: Goal: Ability to maintain clinical measurements within normal limits will improve Outcome: Adequate for Discharge Goal: Will remain free from infection Outcome: Adequate for Discharge Goal: Diagnostic test results will improve Outcome: Adequate for Discharge Goal: Respiratory complications will improve Outcome: Adequate for Discharge Goal: Cardiovascular complication will be avoided Outcome: Adequate for Discharge   Problem: Elimination: Goal: Will not experience complications related to bowel motility Outcome: Adequate for Discharge   Problem: Pain Managment: Goal: General experience of comfort will improve Outcome: Adequate for Discharge   Problem: Safety: Goal: Ability to remain free from injury will improve Outcome: Adequate for Discharge   Problem: Skin Integrity: Goal: Risk for impaired skin integrity will decrease Outcome: Adequate for Discharge

## 2020-07-12 NOTE — Progress Notes (Signed)
Discharge instructions given, pt verbalizes understanding, alert and oriented x4, pt ambulatory.

## 2020-07-12 NOTE — Discharge Summary (Signed)
Physician Discharge Summary  Patient ID: [name] MRN: [id number] DOB/AGE: [birth year] 37 y.o.  Admit date: 07/11/2020 Discharge date: 07/12/2020  Admission Diagnoses: Pregnancy at 37/6. Status post fall like due to MS. Spotting with wiping  Discharge Diagnoses: Pregnancy at 37/0.   Discharged Condition: good  Hospital Course: Patient admitted for 23 hour observation; she was closed/long and no blood on glove on admission. She had reassuring fetal monitoring during her stay with rare contractions and no spotting or bleeding on admission. Patient states she has a walker and equipment at home to help with ambulation.   Neurology was consulted and they recommend a one time dose of IV solumedrol which she received on 1/27.  She received her 2nd venofer dose on 1/27; prior dose on 1/12  Consults: Neurology  Significant Diagnostic Studies:  CBC Latest Ref Rng & Units 07/11/2020 06/26/2020 06/03/2020  WBC 4.0 - 10.5 K/uL 8.2 10.5 9.7  Hemoglobin 12.0 - 15.0 g/dL 9.8(L) 8.7(L) 9.4(L)  Hematocrit 36.0 - 46.0 % 31.8(L) 27.2(L) 29.2(L)  Platelets 150 - 400 K/uL 341 403(H) 371   A POS   Discharge Exam: Blood pressure 128/71, pulse (!) 103, temperature 98.4 F (36.9 C), temperature source Oral, resp. rate 18, height $RemoveBe'5\' 4"'PCQVWDgcJ$  (1.626 m), weight 120.5 kg, last menstrual period 11/17/2019, SpO2 100 %, unknown if currently breastfeeding. General appearance: alert Resp: normal breathing GI: gravid, nttp  Disposition: Discharge disposition: 01-Home or Self Care       Discharge Instructions    Discharge patient   Complete by: As directed    After breakfast and as long as continue to be reassuring   Discharge disposition: 01-Home or Self Care   Discharge patient date: 07/12/2020     Allergies as of 07/12/2020   No Known Allergies     Medication List    TAKE these medications   acetaminophen 325 MG tablet Commonly known as: TYLENOL Take 650 mg by mouth every 6 (six) hours as  needed.   aspirin EC 81 MG tablet Take 1 tablet (81 mg total) by mouth daily. Take after 12 weeks for prevention of preeclampsia later in pregnancy   Blood Pressure Kit Devi 1 kit by Does not apply route once a week. Check Blood Pressure regularly and record readings into the Babyscripts App.  Large Cuff.  DX O90.0   Comfort Fit Maternity Supp Med Misc 1 Device by Does not apply route daily.   famotidine 20 MG tablet Commonly known as: Pepcid Take 1 tablet (20 mg total) by mouth 2 (two) times daily.   promethazine 12.5 MG tablet Commonly known as: PHENERGAN Take 1-2 tablets (12.5-25 mg total) by mouth every 6 (six) hours as needed for nausea or vomiting.       Follow-up Information    [facility]. Go in 7 day(s).   Specialty: Obstetrics and Gynecology Contact information: [address], [place]       Chipper Oman, MD. Go in 4 day(s).   Specialty: Psychiatry Contact information: [place] [address] [phone]               Signed: Aletha Halim 07/12/2020, 6:25 AM

## 2020-07-13 LAB — CULTURE, BETA STREP (GROUP B ONLY): Special Requests: NORMAL

## 2020-07-15 ENCOUNTER — Telehealth: Payer: Self-pay

## 2020-07-15 MED ORDER — URSODIOL 500 MG PO TABS: 500.0000 mg | ORAL_TABLET | Freq: Two times a day (BID) | ORAL | 3 refills | Status: DC

## 2020-07-15 NOTE — Telephone Encounter (Signed)
Patient has not been able to start new Rx for Cholestasis due to pharmacy not having Rx in stock Rx sent to different location near pt address  Pt made aware to contact office if she still cannot receive Rx. Pt agreeable.

## 2020-07-17 ENCOUNTER — Other Ambulatory Visit: Payer: Self-pay

## 2020-07-17 ENCOUNTER — Inpatient Hospital Stay (HOSPITAL_COMMUNITY)
Admission: AD | Admit: 2020-07-17 | Discharge: 2020-07-26 | DRG: 805 | Disposition: A | Discharge status: Home / Self Care | Payer: Medicare HMO | Attending: Obstetrics & Gynecology | Admitting: Obstetrics & Gynecology

## 2020-07-17 ENCOUNTER — Encounter (HOSPITAL_COMMUNITY): Payer: Self-pay | Admitting: Obstetrics and Gynecology

## 2020-07-17 DIAGNOSIS — E669 Obesity, unspecified: Secondary | ICD-10-CM | POA: Diagnosis not present

## 2020-07-17 DIAGNOSIS — O99354 Diseases of the nervous system complicating childbirth: Secondary | ICD-10-CM | POA: Diagnosis present

## 2020-07-17 DIAGNOSIS — O9A213 Injury, poisoning and certain other consequences of external causes complicating pregnancy, third trimester: Secondary | ICD-10-CM

## 2020-07-17 DIAGNOSIS — O411230 Chorioamnionitis, third trimester, not applicable or unspecified: Secondary | ICD-10-CM | POA: Diagnosis not present

## 2020-07-17 DIAGNOSIS — O9A22 Injury, poisoning and certain other consequences of external causes complicating childbirth: Secondary | ICD-10-CM | POA: Diagnosis present

## 2020-07-17 DIAGNOSIS — O09299 Supervision of pregnancy with other poor reproductive or obstetric history, unspecified trimester: Secondary | ICD-10-CM

## 2020-07-17 DIAGNOSIS — O99214 Obesity complicating childbirth: Secondary | ICD-10-CM | POA: Diagnosis present

## 2020-07-17 DIAGNOSIS — N888 Other specified noninflammatory disorders of cervix uteri: Secondary | ICD-10-CM | POA: Diagnosis present

## 2020-07-17 DIAGNOSIS — S3991XA Unspecified injury of abdomen, initial encounter: Secondary | ICD-10-CM | POA: Diagnosis present

## 2020-07-17 DIAGNOSIS — O99284 Endocrine, nutritional and metabolic diseases complicating childbirth: Secondary | ICD-10-CM | POA: Diagnosis present

## 2020-07-17 DIAGNOSIS — O99353 Diseases of the nervous system complicating pregnancy, third trimester: Secondary | ICD-10-CM | POA: Diagnosis not present

## 2020-07-17 DIAGNOSIS — O99324 Drug use complicating childbirth: Secondary | ICD-10-CM | POA: Diagnosis present

## 2020-07-17 DIAGNOSIS — F4323 Adjustment disorder with mixed anxiety and depressed mood: Secondary | ICD-10-CM | POA: Diagnosis present

## 2020-07-17 DIAGNOSIS — K5900 Constipation, unspecified: Secondary | ICD-10-CM | POA: Diagnosis not present

## 2020-07-17 DIAGNOSIS — E538 Deficiency of other specified B group vitamins: Secondary | ICD-10-CM | POA: Diagnosis present

## 2020-07-17 DIAGNOSIS — O2662 Liver and biliary tract disorders in childbirth: Secondary | ICD-10-CM | POA: Diagnosis present

## 2020-07-17 DIAGNOSIS — Z9889 Other specified postprocedural states: Secondary | ICD-10-CM

## 2020-07-17 DIAGNOSIS — R200 Anesthesia of skin: Secondary | ICD-10-CM | POA: Diagnosis not present

## 2020-07-17 DIAGNOSIS — W19XXXA Unspecified fall, initial encounter: Secondary | ICD-10-CM | POA: Diagnosis not present

## 2020-07-17 DIAGNOSIS — R109 Unspecified abdominal pain: Secondary | ICD-10-CM | POA: Diagnosis present

## 2020-07-17 DIAGNOSIS — O26643 Intrahepatic cholestasis of pregnancy, third trimester: Secondary | ICD-10-CM | POA: Diagnosis present

## 2020-07-17 DIAGNOSIS — Z3A35 35 weeks gestation of pregnancy: Secondary | ICD-10-CM | POA: Diagnosis not present

## 2020-07-17 DIAGNOSIS — Z20822 Contact with and (suspected) exposure to covid-19: Secondary | ICD-10-CM | POA: Diagnosis present

## 2020-07-17 DIAGNOSIS — O99892 Other specified diseases and conditions complicating childbirth: Secondary | ICD-10-CM | POA: Diagnosis not present

## 2020-07-17 DIAGNOSIS — O99213 Obesity complicating pregnancy, third trimester: Secondary | ICD-10-CM | POA: Diagnosis not present

## 2020-07-17 DIAGNOSIS — R739 Hyperglycemia, unspecified: Secondary | ICD-10-CM | POA: Diagnosis not present

## 2020-07-17 DIAGNOSIS — G35 Multiple sclerosis: Secondary | ICD-10-CM | POA: Diagnosis present

## 2020-07-17 DIAGNOSIS — Z30014 Encounter for initial prescription of intrauterine contraceptive device: Secondary | ICD-10-CM | POA: Diagnosis not present

## 2020-07-17 DIAGNOSIS — Z3A34 34 weeks gestation of pregnancy: Secondary | ICD-10-CM | POA: Diagnosis not present

## 2020-07-17 DIAGNOSIS — Z30430 Encounter for insertion of intrauterine contraceptive device: Secondary | ICD-10-CM | POA: Diagnosis not present

## 2020-07-17 DIAGNOSIS — Z975 Presence of (intrauterine) contraceptive device: Secondary | ICD-10-CM

## 2020-07-17 DIAGNOSIS — T380X5A Adverse effect of glucocorticoids and synthetic analogues, initial encounter: Secondary | ICD-10-CM | POA: Diagnosis not present

## 2020-07-17 DIAGNOSIS — O99613 Diseases of the digestive system complicating pregnancy, third trimester: Secondary | ICD-10-CM | POA: Diagnosis not present

## 2020-07-17 DIAGNOSIS — N800 Endometriosis of uterus: Secondary | ICD-10-CM | POA: Diagnosis present

## 2020-07-17 DIAGNOSIS — O24113 Pre-existing diabetes mellitus, type 2, in pregnancy, third trimester: Secondary | ICD-10-CM | POA: Diagnosis not present

## 2020-07-17 DIAGNOSIS — O99283 Endocrine, nutritional and metabolic diseases complicating pregnancy, third trimester: Secondary | ICD-10-CM | POA: Diagnosis not present

## 2020-07-17 DIAGNOSIS — O09213 Supervision of pregnancy with history of pre-term labor, third trimester: Secondary | ICD-10-CM | POA: Diagnosis not present

## 2020-07-17 DIAGNOSIS — F1290 Cannabis use, unspecified, uncomplicated: Secondary | ICD-10-CM | POA: Diagnosis present

## 2020-07-17 DIAGNOSIS — K831 Obstruction of bile duct: Secondary | ICD-10-CM | POA: Diagnosis present

## 2020-07-17 DIAGNOSIS — W010XXA Fall on same level from slipping, tripping and stumbling without subsequent striking against object, initial encounter: Secondary | ICD-10-CM | POA: Diagnosis present

## 2020-07-17 DIAGNOSIS — O09523 Supervision of elderly multigravida, third trimester: Secondary | ICD-10-CM | POA: Diagnosis not present

## 2020-07-17 DIAGNOSIS — Z6841 Body Mass Index (BMI) 40.0 and over, adult: Secondary | ICD-10-CM

## 2020-07-17 DIAGNOSIS — O0990 Supervision of high risk pregnancy, unspecified, unspecified trimester: Secondary | ICD-10-CM

## 2020-07-17 DIAGNOSIS — R296 Repeated falls: Secondary | ICD-10-CM | POA: Diagnosis not present

## 2020-07-17 DIAGNOSIS — O26613 Liver and biliary tract disorders in pregnancy, third trimester: Secondary | ICD-10-CM | POA: Diagnosis not present

## 2020-07-17 LAB — CBC
HCT: 32.9 % — ABNORMAL LOW (ref 36.0–46.0)
Hemoglobin: 10 g/dL — ABNORMAL LOW (ref 12.0–15.0)
MCH: 29.5 pg (ref 26.0–34.0)
MCHC: 30.4 g/dL (ref 30.0–36.0)
MCV: 97.1 fL (ref 80.0–100.0)
Platelets: 409 10*3/uL — ABNORMAL HIGH (ref 150–400)
RBC: 3.39 MIL/uL — ABNORMAL LOW (ref 3.87–5.11)
RDW: 15.4 % (ref 11.5–15.5)
WBC: 9.4 10*3/uL (ref 4.0–10.5)
nRBC: 0 % (ref 0.0–0.2)

## 2020-07-17 LAB — TYPE AND SCREEN
ABO/RH(D): A POS
Antibody Screen: NEGATIVE

## 2020-07-17 LAB — SARS CORONAVIRUS 2 BY RT PCR (HOSPITAL ORDER, PERFORMED IN ~~LOC~~ HOSPITAL LAB): SARS Coronavirus 2: NEGATIVE

## 2020-07-17 MED ORDER — URSODIOL 60 MG/ML SUSP
500.0000 mg | Freq: Two times a day (BID) | ORAL | Status: DC
Start: 2020-07-17 — End: 2020-07-17
  Filled 2020-07-17: qty 16.7

## 2020-07-17 MED ORDER — HYDROXYZINE HCL 25 MG PO TABS
25.0000 mg | ORAL_TABLET | Freq: Four times a day (QID) | ORAL | Status: DC | PRN
  Administered 2020-07-20 – 2020-07-22 (×2): 25 mg via ORAL
  Filled 2020-07-17 (×3): qty 1

## 2020-07-17 MED ORDER — PROMETHAZINE HCL 25 MG PO TABS
12.5000 mg | ORAL_TABLET | Freq: Four times a day (QID) | ORAL | Status: DC | PRN
  Filled 2020-07-17: qty 1

## 2020-07-17 MED ORDER — ASPIRIN EC 81 MG PO TBEC
81.0000 mg | DELAYED_RELEASE_TABLET | Freq: Every day | ORAL | Status: DC
  Administered 2020-07-17 – 2020-07-20 (×4): 81 mg via ORAL
  Filled 2020-07-17 (×5): qty 1

## 2020-07-17 MED ORDER — FAMOTIDINE 20 MG PO TABS
20.0000 mg | ORAL_TABLET | Freq: Two times a day (BID) | ORAL | Status: DC
  Administered 2020-07-17 – 2020-07-26 (×16): 20 mg via ORAL
  Filled 2020-07-17 (×16): qty 1

## 2020-07-17 MED ORDER — DOCUSATE SODIUM 100 MG PO CAPS
100.0000 mg | ORAL_CAPSULE | Freq: Every day | ORAL | Status: DC
  Administered 2020-07-17 – 2020-07-23 (×5): 100 mg via ORAL
  Filled 2020-07-17 (×7): qty 1

## 2020-07-17 MED ORDER — CALCIUM CARBONATE ANTACID 500 MG PO CHEW
2.0000 | CHEWABLE_TABLET | ORAL | Status: DC | PRN
  Administered 2020-07-22 – 2020-07-25 (×5): 400 mg via ORAL
  Filled 2020-07-17 (×5): qty 2

## 2020-07-17 MED ORDER — CYANOCOBALAMIN 1000 MCG/ML IJ SOLN
1000.0000 ug | Freq: Once | INTRAMUSCULAR | Status: AC
  Administered 2020-07-17: 1000 ug via INTRAMUSCULAR
  Filled 2020-07-17: qty 1

## 2020-07-17 MED ORDER — URSODIOL 500 MG PO TABS
500.0000 mg | ORAL_TABLET | Freq: Two times a day (BID) | ORAL | Status: DC
  Filled 2020-07-17: qty 1

## 2020-07-17 MED ORDER — METHYLPREDNISOLONE SODIUM SUCC 40 MG IJ SOLR
1000.0000 mg | Freq: Every day | INTRAVENOUS | Status: DC
  Filled 2020-07-17: qty 25

## 2020-07-17 MED ORDER — PRENATAL MULTIVITAMIN CH
1.0000 | ORAL_TABLET | Freq: Every day | ORAL | Status: DC
  Administered 2020-07-18 – 2020-07-25 (×7): 1 via ORAL
  Filled 2020-07-17 (×9): qty 1

## 2020-07-17 MED ORDER — SODIUM CHLORIDE 0.9 % IV SOLN
1000.0000 mg | Freq: Every day | INTRAVENOUS | Status: AC
  Administered 2020-07-17 – 2020-07-21 (×5): 1000 mg via INTRAVENOUS
  Filled 2020-07-17 (×5): qty 8

## 2020-07-17 MED ORDER — ACETAMINOPHEN 325 MG PO TABS
650.0000 mg | ORAL_TABLET | ORAL | Status: DC | PRN
  Administered 2020-07-17 – 2020-07-26 (×5): 650 mg via ORAL
  Filled 2020-07-17 (×5): qty 2

## 2020-07-17 MED ORDER — URSODIOL 300 MG PO CAPS
600.0000 mg | ORAL_CAPSULE | Freq: Three times a day (TID) | ORAL | Status: DC
  Administered 2020-07-17 – 2020-07-23 (×19): 600 mg via ORAL
  Filled 2020-07-17 (×25): qty 2

## 2020-07-17 NOTE — H&P (Signed)
FACULTY PRACTICE ANTEPARTUM ADMISSION HISTORY AND PHYSICAL NOTE   History of Present Illness: [name] is a 35 y.o. [id number] at 28w5dadmitted for abdominal cramping s/p fall and treatment of her MS.  Patient reports fall occurred about 1-2 hours prior to arrival. States she has had several falls in the last month due to relapse in her MS. Lost her balance and fell this afternoon, landed on her abdomen. Reports constant abdominal cramping since her fall. Denies lof or vaginal bleeding.  Saw her neurologist yesterday; recommends 5 infusions of solumedrol & is awaiting to hear back from her ob/gyn regarding location of treatment. Also needs vitamin b12 injections due to deficiency.  Patient is currently taking ursodiol for cholestasis.   Patient reports the fetal movement as active. Patient reports uterine contraction  activity as constant abdominal cramping. Patient reports  vaginal bleeding as none. Patient describes fluid per vagina as None. Fetal presentation is not evaluated.  Patient Active Problem List   Diagnosis Date Noted  . Abdominal trauma 07/17/2020  . Abnormal fetal ultrasound 07/12/2020  . 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 Maternal morbid obesity, antepartum ([facility] 07/11/2020  . BMI 40.0-44.9, adult ([facility] 07/11/2020  . Fall on gravid abdomen in third trimester 07/11/2020  . Hx of preeclampsia, prior pregnancy, currently pregnant 05/23/2020  . Supervision of high-risk pregnancy 02/20/2020  . History of reversal of tubal ligation 12/20/2019  . Adenomyosis 05/09/2019  . Anxiety 11/28/2018  . Endometriosis 05/23/2018  . Fibroid of cervix   . Cervical mass 12/23/2016  . Child behavior problem 03/27/2016  . Malabsorption 08/29/2013  . Acute relapsing multiple sclerosis ([facility] 07/22/2013  . B12 deficiency 06/27/2013  . Insomnia 06/27/2013  . Adjustment disorder with mixed anxiety and depressed mood 03/13/2013  . Multiple sclerosis affecting  pregnancy in third trimester ([facility] 03/13/2013  . Abdominal pain 07/29/2007    Past Medical History:  Diagnosis Date  . Abnormal Pap smear    Colpo>normal  . Fibroid   . Gonorrhea   . Headache    last migraine 01/2017  . History of IBS    watch diet, no meds  . Infection    UTI  . Multiple sclerosis ([facility]   . [facility]rom[facility] ([facility]    diagnosed with MS this visit  . Obesity   . Ovarian cyst   . Pernicious anemia 05/02/2013  . Pregnancy induced hypertension   . Preterm labor    all 3 deliveries  . SVD (spontaneous vaginal delivery)    x 3  . Urinary tract infection   . Vaginal Pap smear, abnormal     Past Surgical History:  Procedure Laterality Date  . CHOLECYSTECTOMY N/A 11/25/2017   Procedure: LAPAROSCOPIC CHOLECYSTECTOMY WITH INTRAOPERATIVE CHOLANGIOGRAM;  Surgeon: GArmandina Gemma MD;  Location: [facility];  Service: General;  Laterality: N/A;  . COLPOSCOPY    . MYOMECTOMY N/A 04/01/2017   Procedure: Vaginal Myomectomy;  Surgeon: AOsborne Oman MD;  [facility];  Service: Gynecology;  Laterality: N/A;  . TUBAL LIGATION     2012/ reversal June 2020  . WISDOM TOOTH EXTRACTION      OB History  Gravida Para Term Preterm AB Living  7 3 0 _0 SAB IAB Ectopic Multiple Live Births    _1 # Outcome Date GA Lbr Len/2nd Weight Sex Delivery Anes PTL Lv  7 Current           6A Preterm  08/30/10 [redacted]w[redacted]d  F Vag-Spont   LIV  6B Preterm     F    LIV  5 Preterm 05/31/08 3105w0d M Vag-Spont   LIV  4 Preterm 06/13/03 3521w0dF Vag-Spont   LIV  3 IAB           2 IAB           1 IAB             Obstetric Comments  Induced early due to Pre- E with all prenancies    Social History   Socioeconomic History  . Marital status: Single    Spouse name: Not on file  . Number of children: 4  . Years of education: 13 30 Highest education level: Not on file  Occupational History  . Occupation: UNEMPLOYED-DISABLE    Comment: [employer]  Smoking status: Never Smoker  . Smokeless tobacco: Never Used  Vaping Use  . Vaping Use: Never used  Substance and Sexual Activity  . Alcohol use: No    Alcohol/week: 0.0 standard drinks    Comment:    . Drug use: No    Comment: denies use, states was given CBD gummies for nausea  . Sexual activity: Yes    Partners: Male  Other Topics Concern  . Not on file  Social History Narrative   Lives with 4 children. Good support group in her community including mother who lives close.    Social Determinants of Health   Financial Resource Strain: Not on file  Food Insecurity: Not on file  Transportation Needs: Not on file  Physical Activity: Not on file  Stress: Not on file  Social Connections: Not on file    Family History  Problem Relation Age of Onset  . Diabetes Mother   . Hypertension Mother   . Diabetes Father   . Hypertension Father   . Kidney disease Father   . Stroke Father   . Learning disabilities Brother   . Anesthesia problems Neg Hx   . Other Neg Hx     No Known Allergies  Medications Prior to Admission  Medication Sig Dispense Refill Last Dose  . acetaminophen (TYLENOL) 325 MG tablet Take 650 mg by mouth every 6 (six) hours as needed.     . aMarland Kitchenpirin EC 81 MG tablet Take 1 tablet (81 mg total) by mouth daily. Take after 12 weeks for prevention of preeclampsia later in pregnancy 300 tablet 2   . Blood Pressure Monitoring (BLOOD PRESSURE KIT) DEVI 1 kit by Does not apply route once a week. Check Blood Pressure regularly and record readings into the Babyscripts App.  Large Cuff.  DX O90.0 1 each 0   . Elastic Bandages & Supports (COMFORT FIT MATERNITY SUPP MED) MISC 1 Device by Does not apply route daily. 1 each 0   . famotidine (PEPCID) 20 MG tablet Take 1 tablet (20 mg total) by mouth 2 (two) times daily. 60 tablet 2   . hydrOXYzine (ATARAX/VISTARIL) 25 MG tablet Take 1 tablet (25 mg total) by mouth every 6 (six) hours as needed for itching. 30 tablet 2   .  promethazine (PHENERGAN) 12.5 MG tablet Take 1-2 tablets (12.5-25 mg total) by mouth every 6 (six) hours as needed for nausea or vomiting. 30 tablet 2   . ursodiol (ACTIGALL) 500 MG tablet Take 1 tablet (500 mg total) by mouth 2 (two) times daily. 60 tablet 3  Review of Systems - Gastrointestinal ROS: positive for - abdominal pain Genito-Urinary ROS: negative for - vaginal bleeding or LOF Neurological ROS: positive for - numbness/tingling and weakness  Vitals:  BP (!) 115/55 (BP Location: Right Arm)   Pulse 97   Temp 98.8 F (37.1 C) (Oral)   Resp 16   LMP 11/17/2019   SpO2 98% Comment: room air Physical Examination: CONSTITUTIONAL: Well-developed, well-nourished female in no acute distress.  HENT:  Normocephalic, atraumatic, External right and left ear normal. Oropharynx is clear and moist EYES: Conjunctivae and EOM are normal. Pupils are equal, round, and reactive to light. No scleral icterus.  NECK: Normal range of motion, supple, no masses SKIN: Skin is warm and dry. No rash noted. Not diaphoretic. No erythema. No pallor. [place]: Alert and oriented to person, place, and time. Normal reflexes, muscle tone coordination. No cranial nerve deficit noted. PSYCHIATRIC: Normal mood and affect. Normal behavior. Normal judgment and thought content. CARDIOVASCULAR: Normal heart rate noted, regular rhythm RESPIRATORY: Effort and breath sounds normal, no problems with respiration noted ABDOMEN: Soft, nontender, nondistended, gravid. MUSCULOSKELETAL: Normal range of motion. No edema and no tenderness. 2+ distal pulses.  Cervix:  not evaluated Membranes:intact Fetal Monitoring:Baseline: 145 bpm, Variability: Good {> 6 bpm), Accelerations: Reactive and Decelerations: Absent Tocometer: Flat  Labs:  No results found for this or any previous visit (from the past 24 hour(s)).  Imaging Studies: No results found.   Assessment and Plan: 1. Traumatic injury during pregnancy in third  trimester   2. Cholestasis during pregnancy in third trimester   3. B12 deficiency   4. Multiple sclerosis affecting pregnancy in third trimester ([place])   5. [redacted] weeks gestation of pregnancy     Jorje Guild, NP 07/17/2020 5:02 PM

## 2020-07-17 NOTE — MAU Note (Signed)
[name] is a 35 y.o. at [redacted]w[redacted]d here in MAU reporting: pt reports she fell about 1.5 hours ago down the stairs into her bedroom. States she lost her balance due to her MS and hit the front of her abdomen. Reporting cramping since the fall. No bleeding or LOF. + FM prior to the fall and has felt less movement since the fall. EFM applied in MAU and pt now reporting FM.  Onset of complaint: today  Pain score: 7/10  Vitals:   07/17/20 1616  BP: (!) 115/55  Pulse: 97  Resp: 16  Temp: 98.8 F (37.1 C)  SpO2: 98%     FHT: EFM applied  Lab orders placed from triage: none

## 2020-07-18 DIAGNOSIS — S3991XA Unspecified injury of abdomen, initial encounter: Secondary | ICD-10-CM

## 2020-07-18 DIAGNOSIS — O26613 Liver and biliary tract disorders in pregnancy, third trimester: Secondary | ICD-10-CM

## 2020-07-18 DIAGNOSIS — O99353 Diseases of the nervous system complicating pregnancy, third trimester: Secondary | ICD-10-CM

## 2020-07-18 DIAGNOSIS — O99613 Diseases of the digestive system complicating pregnancy, third trimester: Secondary | ICD-10-CM

## 2020-07-18 DIAGNOSIS — W19XXXA Unspecified fall, initial encounter: Secondary | ICD-10-CM

## 2020-07-18 DIAGNOSIS — Z3A34 34 weeks gestation of pregnancy: Secondary | ICD-10-CM

## 2020-07-18 DIAGNOSIS — G35 Multiple sclerosis: Secondary | ICD-10-CM

## 2020-07-18 DIAGNOSIS — K831 Obstruction of bile duct: Secondary | ICD-10-CM

## 2020-07-18 LAB — GLUCOSE, CAPILLARY
Glucose-Capillary: 155 mg/dL — ABNORMAL HIGH (ref 70–99)
Glucose-Capillary: 168 mg/dL — ABNORMAL HIGH (ref 70–99)
Glucose-Capillary: 194 mg/dL — ABNORMAL HIGH (ref 70–99)
Glucose-Capillary: 198 mg/dL — ABNORMAL HIGH (ref 70–99)
Glucose-Capillary: 201 mg/dL — ABNORMAL HIGH (ref 70–99)
Glucose-Capillary: 222 mg/dL — ABNORMAL HIGH (ref 70–99)
Glucose-Capillary: 222 mg/dL — ABNORMAL HIGH (ref 70–99)
Glucose-Capillary: 248 mg/dL — ABNORMAL HIGH (ref 70–99)
Glucose-Capillary: 286 mg/dL — ABNORMAL HIGH (ref 70–99)

## 2020-07-18 MED ORDER — ZOLPIDEM TARTRATE 5 MG PO TABS
5.0000 mg | ORAL_TABLET | Freq: Every evening | ORAL | Status: DC | PRN
  Administered 2020-07-18 – 2020-07-19 (×2): 5 mg via ORAL
  Filled 2020-07-18 (×2): qty 1

## 2020-07-18 MED ORDER — LACTATED RINGERS IV SOLN: INTRAVENOUS | Status: DC

## 2020-07-18 MED ORDER — BUTALBITAL-APAP-CAFFEINE 50-325-40 MG PO TABS
1.0000 | ORAL_TABLET | ORAL | Status: DC | PRN
  Administered 2020-07-18 – 2020-07-25 (×5): 1 via ORAL
  Filled 2020-07-18 (×5): qty 1

## 2020-07-18 MED ORDER — INSULIN REGULAR(HUMAN) IN NACL 100-0.9 UT/100ML-% IV SOLN
INTRAVENOUS | Status: DC
  Administered 2020-07-18: 7.5 [IU]/h via INTRAVENOUS
  Administered 2020-07-19: 4 [IU]/h via INTRAVENOUS
  Filled 2020-07-18: qty 100

## 2020-07-18 MED ORDER — DEXTROSE 50 % IV SOLN: 0.0000 mL | INTRAVENOUS | Status: DC | PRN

## 2020-07-18 MED ORDER — DEXTROSE IN LACTATED RINGERS 5 % IV SOLN: INTRAVENOUS | Status: DC

## 2020-07-18 NOTE — Progress Notes (Signed)
Patient ID: [name], female   DOB: Jul 06, 1985, 35 y.o.   MRN: [id number] [name]) NOTE  [name] is a 35 y.o. [id number] at [redacted]w[redacted]d who is admitted for observations following a fall on the abdomen.    Fetal presentation is cephalic. Length of Stay:  1  Days  Date of admission:07/17/2020  Subjective: Patient reports feeling well this morning. She denies any contractions, vaginal bleeding or leakage of fluid. She reports good fetal movement   Vitals:  Blood pressure 134/65, pulse (!) 115, temperature 98.3 F (36.8 C), temperature source Oral, resp. rate 18, last menstrual period 11/17/2019, SpO2 98 %, unknown if currently breastfeeding. Vitals:   07/17/20 1925 07/17/20 2349 07/18/20 0427 07/18/20 0806  BP:  (!) 112/57 (!) 105/52 134/65  Pulse:  (!) 101 97 (!) 115  Resp:  18 16 18   Temp:  97.8 F (36.6 C) 97.7 F (36.5 C) 98.3 F (36.8 C)  TempSrc:  Oral Oral Oral  SpO2: 99% 100% 98% 98%   Physical Examination: GENERAL: Well-developed, well-nourished female in no acute distress.  LUNGS: Clear to auscultation bilaterally.  HEART: Regular rate and rhythm. ABDOMEN: Soft, nontender, gravid PELVIC: Not performed EXTREMITIES: No cyanosis, clubbing, or edema, 2+ distal pulses.   Fetal Monitoring:  Baseline: 130 bpm, Variability: Good {> 6 bpm), Accelerations: Reactive and Decelerations: Absent    [place] irregular contractions  Labs:  Results for orders placed or performed during the hospital encounter of 07/17/20 (from the past 24 hour(s))  CBC on admission   Collection Time: 07/17/20  4:57 PM  Result Value Ref Range   WBC 9.4 4.0 - 10.5 K/uL   RBC 3.39 (L) 3.87 - 5.11 MIL/uL   Hemoglobin 10.0 (L) 12.0 - 15.0 g/dL   HCT 32.9 (L) 36.0 - 46.0 %   MCV 97.1 80.0 - 100.0 fL   MCH 29.5 26.0 - 34.0 pg   MCHC 30.4 30.0 - 36.0 g/dL   RDW 15.4 11.5 - 15.5 %   Platelets 409 (H) 150 - 400 K/uL   nRBC 0.0 0.0 - 0.2 %  Type and screen [place]   Collection Time: 07/17/20  4:57 PM  Result Value Ref Range   ABO/RH(D) A POS    Antibody Screen NEG    Sample Expiration      07/20/2020,2359 Performed at [facility], [address] [place], [place] [address]   SARS Coronavirus 2 by RT PCR (hospital order, performed in [facility] hospital lab) Nasopharyngeal Nasopharyngeal Swab   Collection Time: 07/17/20  4:58 PM   Specimen: Nasopharyngeal Swab  Result Value Ref Range   SARS Coronavirus 2 NEGATIVE NEGATIVE    Imaging Studies:    No results found.   Medications:  Scheduled . aspirin EC  81 mg Oral Daily  . docusate sodium  100 mg Oral Daily  . famotidine  20 mg Oral BID  . prenatal multivitamin  1 tablet Oral Q1200  . ursodiol  600 mg Oral TID   I have reviewed the patient's current medications.  ASSESSMENT: [id number] [redacted]w[redacted]d Estimated Date of Delivery: 08/23/20  Patient Active Problem List   Diagnosis Date Noted  . Abdominal trauma 07/17/2020  . Abnormal fetal ultrasound 07/12/2020  . 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 Maternal morbid obesity, antepartum ([place]) 07/11/2020  . BMI 40.0-44.9, adult ([place]) 07/11/2020  . Fall on gravid abdomen in third trimester 07/11/2020  . Hx of preeclampsia, prior pregnancy, currently  pregnant 05/23/2020  . Supervision of high-risk pregnancy 02/20/2020  . History of reversal of tubal ligation 12/20/2019  . Adenomyosis 05/09/2019  . Anxiety 11/28/2018  . Endometriosis 05/23/2018  . Fibroid of cervix   . Cervical mass 12/23/2016  . Child behavior problem 03/27/2016  . Malabsorption 08/29/2013  . 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 B12 deficiency 06/27/2013  . Insomnia 06/27/2013  . Adjustment disorder with mixed anxiety and depressed mood 03/13/2013  . Multiple sclerosis affecting pregnancy in third trimester ([place]) 03/13/2013  . Abdominal pain 07/29/2007    PLAN: - Patient s/p fall around 2 pm yesterday  afternoon. Maternal/fetal status remains stable  - Patient with cholestasis of pregnancy    - Continue Actigall    - BPP schedule for inpatient tomorrow  - Patient with active relapse in MS    - Dr. George Hugh neurologist informed of current admission    - Patient received B12 yesterday    - Plan to complete a total of 5 days of solumedrol    - Per neurologist, pregnancy is delaying start of MS treatment    - MFM consult placed to discuss timing of deliver given this acute relapse  - Continue routine antepartum care  [name] 07/18/2020,9:20 AM

## 2020-07-18 NOTE — Consult Note (Signed)
MFM note  [name] is a 35 year old gravida 7 para 0-3-3-4 currently at 34 weeks and 6 days.  She was admitted yesterday due to a fall on her abdomen.  The patient reports that she was diagnosed with multiple sclerosis about 7 to 8 years ago.  She had been treated with Ocrevus for multiple sclerosis prior to pregnancy.  However, the medication was stopped early in her pregnancy due to the unknown effects that this medication may have on her fetus.    She reports that her fall yesterday was due to an exacerbation of her multiple sclerosis.  Her neurologist believes that her multiple sclerosis exacerbation may worsen without treatment and has recommended delivery as soon as possible so that she may be restarted on Ocrevus after delivery. The patient is currently receiving daily IV Solu-Medrol and will continue to receive this medication for treatment of multiple sclerosis for a total of 5 days.  The patient's current pregnancy has also been complicated by cholestasis of pregnancy.  Her total bile acids were elevated (13.8) at the end of January.  She is currently treated with Actigall.  Her liver function tests have been within normal limits.  The patient has not suffered any adverse sequelae from her fall yesterday.  She denies any vaginal bleeding and reports feeling fetal movement throughout the day.  Her NST has been reactive.  Due to cholestasis of pregnancy and so that the patient may receive treatment for multiple sclerosis after delivery, I would recommend delivery next week when she will be 35+ weeks.    The patient is scheduled to complete her IV Solu-Medrol course on Sunday.  I would then administer a complete course of betamethasone on Monday and Tuesday for fetal benefits and proceed with delivery on Wednesday.  The patient reports that her neurologist has told her that she can start [place] about 1 week after delivery.    The patient understands that there is a small possibility that  her baby may still require a NICU admission for delivery at 35+ weeks.  However, a late preterm delivery is indicated in her case so that she may receive definitive treatment for multiple sclerosis and to prevent further maternal morbidity.  The patient is happy and agreeable with this plan.  Consideration may be given to inpatient management until delivery to decrease her chances of another fall.

## 2020-07-19 ENCOUNTER — Inpatient Hospital Stay (HOSPITAL_BASED_OUTPATIENT_CLINIC_OR_DEPARTMENT_OTHER): Payer: Medicare HMO

## 2020-07-19 ENCOUNTER — Ambulatory Visit: Payer: Medicare HMO

## 2020-07-19 ENCOUNTER — Encounter: Payer: Medicare HMO | Admitting: Family Medicine

## 2020-07-19 DIAGNOSIS — E669 Obesity, unspecified: Secondary | ICD-10-CM | POA: Diagnosis not present

## 2020-07-19 DIAGNOSIS — O09523 Supervision of elderly multigravida, third trimester: Secondary | ICD-10-CM | POA: Diagnosis not present

## 2020-07-19 DIAGNOSIS — Z3A35 35 weeks gestation of pregnancy: Secondary | ICD-10-CM

## 2020-07-19 DIAGNOSIS — O09213 Supervision of pregnancy with history of pre-term labor, third trimester: Secondary | ICD-10-CM | POA: Diagnosis not present

## 2020-07-19 DIAGNOSIS — G35 Multiple sclerosis: Secondary | ICD-10-CM

## 2020-07-19 DIAGNOSIS — O99353 Diseases of the nervous system complicating pregnancy, third trimester: Secondary | ICD-10-CM

## 2020-07-19 DIAGNOSIS — O99213 Obesity complicating pregnancy, third trimester: Secondary | ICD-10-CM

## 2020-07-19 LAB — GLUCOSE, CAPILLARY
Glucose-Capillary: 146 mg/dL — ABNORMAL HIGH (ref 70–99)
Glucose-Capillary: 126 mg/dL — ABNORMAL HIGH (ref 70–99)
Glucose-Capillary: 149 mg/dL — ABNORMAL HIGH (ref 70–99)
Glucose-Capillary: 118 mg/dL — ABNORMAL HIGH (ref 70–99)
Glucose-Capillary: 124 mg/dL — ABNORMAL HIGH (ref 70–99)
Glucose-Capillary: 116 mg/dL — ABNORMAL HIGH (ref 70–99)
Glucose-Capillary: 119 mg/dL — ABNORMAL HIGH (ref 70–99)
Glucose-Capillary: 103 mg/dL — ABNORMAL HIGH (ref 70–99)
Glucose-Capillary: 123 mg/dL — ABNORMAL HIGH (ref 70–99)
Glucose-Capillary: 89 mg/dL (ref 70–99)
Glucose-Capillary: 74 mg/dL (ref 70–99)
Glucose-Capillary: 156 mg/dL — ABNORMAL HIGH (ref 70–99)
Glucose-Capillary: 198 mg/dL — ABNORMAL HIGH (ref 70–99)

## 2020-07-19 LAB — BASIC METABOLIC PANEL
Anion gap: 11 (ref 5–15)
BUN: 6 mg/dL (ref 6–20)
CO2: 21 mmol/L — ABNORMAL LOW (ref 22–32)
Calcium: 9 mg/dL (ref 8.9–10.3)
Chloride: 104 mmol/L (ref 98–111)
Creatinine, Ser: 0.79 mg/dL (ref 0.44–1.00)
GFR, Estimated: >60 mL/min (ref >60)
Glucose, Bld: 134 mg/dL — ABNORMAL HIGH (ref 70–99)
Potassium: 3.8 mmol/L (ref 3.5–5.1)
Sodium: 136 mmol/L (ref 135–145)

## 2020-07-19 MED ORDER — INSULIN NPH (HUMAN) (ISOPHANE) 100 UNIT/ML ~~LOC~~ SUSP
30.0000 [IU] | Freq: Two times a day (BID) | SUBCUTANEOUS | Status: DC
  Administered 2020-07-19 – 2020-07-24 (×10): 30 [IU] via SUBCUTANEOUS
  Filled 2020-07-19: qty 10

## 2020-07-19 MED ORDER — ENOXAPARIN SODIUM 60 MG/0.6ML ~~LOC~~ SOLN
0.5000 mg/kg | SUBCUTANEOUS | Status: DC
  Administered 2020-07-19 – 2020-07-20 (×2): 60 mg via SUBCUTANEOUS
  Filled 2020-07-19 (×2): qty 0.6

## 2020-07-19 MED ORDER — INSULIN ASPART 100 UNIT/ML ~~LOC~~ SOLN
0.0000 [IU] | Freq: Three times a day (TID) | SUBCUTANEOUS | Status: DC
  Administered 2020-07-19: 4 [IU] via SUBCUTANEOUS
  Administered 2020-07-20 (×2): 3 [IU] via SUBCUTANEOUS
  Administered 2020-07-20: 7 [IU] via SUBCUTANEOUS
  Administered 2020-07-20: 0 [IU] via SUBCUTANEOUS

## 2020-07-19 MED ORDER — INSULIN ASPART 100 UNIT/ML ~~LOC~~ SOLN
6.0000 [IU] | Freq: Three times a day (TID) | SUBCUTANEOUS | Status: DC
  Administered 2020-07-19: 6 [IU] via SUBCUTANEOUS

## 2020-07-19 NOTE — Evaluation (Signed)
Physical Therapy Evaluation Patient Details Name: [name] MRN: [name] DOB: Jan 19, 1986 Today's Date: 07/19/2020   History of Present Illness  [name] is a 35 y.o. [name] at [name]  who is admitted for observation following a fall and acute relapse of MS.  Clinical Impression  Patient presents with mobility close to functional baseline.  Reports 5 falls in the past month related to MS relapse and her weight due to pregnancy.  She has all needed DME at home and relates on steep step into the bedroom and we discussed using RW at that step for safety.  Also discussed walking in hallway with nursing as plans to stay till induction next week.  No further skilled PT needs at this time.  PT will sign off.     Follow Up Recommendations No PT follow up    Equipment Recommendations  None recommended by PT    Recommendations for Other Services       Precautions / Restrictions Precautions Precautions: Fall Precaution Comments: reports falling 5 times in past month      Mobility  Bed Mobility Overal bed mobility: Modified Independent                  Transfers Overall transfer level: Modified independent Equipment used: None             General transfer comment: 400  Ambulation/Gait Ambulation/Gait assistance: Supervision   Assistive device: None Gait Pattern/deviations: Step-through pattern;Decreased stride length;Drifts right/left     General Gait Details: mild unsteadiness with S for safety, R knee not buckling today, but note she can lose balance that way and takes couple steps to regain  Stairs            Wheelchair Mobility    Modified Rankin (Stroke Patients Only)       Balance Overall balance assessment: Needs assistance   Sitting balance-Leahy Scale: Good       Standing balance-Leahy Scale: Good Standing balance comment: static standing without UE support               High Level Balance Comments: eyes closed 10 sec  with posterior sway and independent recovery, partial tandem with R foot in back 30 sec noting some wavering and S needed; L foot in back 30 sec no difficulty             Pertinent Vitals/Pain Pain Assessment: No/denies pain    Home Living Family/patient expects to be discharged to:: Private residence Living Arrangements: Children;Spouse/significant other Available Help at Discharge: Family Type of Home: House Home Access: Stairs to enter Entrance Stairs-Rails: Right Entrance Stairs-Number of Steps: 6 Home Layout: Two level Home Equipment: Environmental consultant - 2 wheels;Cane - single point;Bedside commode      Prior Function Level of Independence: Independent         Comments: sometime uses cane and has used 3:1 in shower but husband moves it out, etc.; has aide 2 hours a day 7 d/week     Hand Dominance        Extremity/Trunk Assessment   Upper Extremity Assessment Upper Extremity Assessment: RUE deficits/detail RUE Deficits / Details: significant decreased grip strength compared to L but reports still able to write    Lower Extremity Assessment Lower Extremity Assessment: RLE deficits/detail RLE Deficits / Details: AROM WFL, strength hip flexion 3-/5, knee extension 3+/5, ankle DF 3/5 RLE Sensation: WNL (but feels it is due to the steroids)       Communication  Communication: No difficulties  Cognition Arousal/Alertness: Awake/alert Behavior During Therapy: WFL for tasks assessed/performed Overall Cognitive Status: Within Functional Limits for tasks assessed                                        General Comments General comments (skin integrity, edema, etc.): Discussed plans as she is already close to 36 weeks and previous children were born prior to 36 weeks.  States planned induction next Wednesday so she can get back on MS medication.  She states will stay till then.  Discussed walking in hallway to avoid losing more strength, but needs nursing  supervision.  She agreed.    Exercises     Assessment/Plan    PT Assessment [place] does not need any further PT services  PT Problem List         PT Treatment Interventions      PT Goals (Current goals can be found in the Care Plan section)  Acute Rehab PT Goals PT Goal Formulation: All assessment and education complete, DC therapy    Frequency     Barriers to discharge        Co-evaluation               AM-PAC PT "6 Clicks" Mobility  Outcome Measure Help needed turning from your back to your side while in a flat bed without using bedrails?: None Help needed moving from lying on your back to sitting on the side of a flat bed without using bedrails?: None Help needed moving to and from a bed to a chair (including a wheelchair)?: None Help needed standing up from a chair using your arms (e.g., wheelchair or bedside chair)?: None Help needed to walk in hospital room?: None Help needed climbing 3-5 steps with a railing? : A Little 6 Click Score: 23    End of Session   Activity Tolerance: Patient tolerated treatment well Patient left: in bed;with call bell/phone within reach (seated EOB) Nurse Communication: Other (comment) (walking with pt daily) PT Visit Diagnosis: History of falling (Z91.81)    Time: 4709-6283 PT Time Calculation (min) (ACUTE ONLY): 25 min   Charges:   PT Evaluation $PT Eval Low Complexity: 1 Low PT Treatments $Self Care/Home Management: 8-22        Magda Kiel, PT Acute Rehabilitation Services [phone] [phone] 07/19/2020   Reginia Naas 07/19/2020, 3:45 PM

## 2020-07-19 NOTE — Progress Notes (Signed)
Patient ID: TURQUOISE ESCH, female   DOB: Jan 01, 1986, 35 y.o.   MRN: [id number] [place] COMPREHENSIVE PROGRESS NOTE  ORVILLE WIDMANN is a 35 y.o. [id number] at [redacted]w[redacted]d  who is admitted for observation following a fall and acute relapse of MS.   Fetal presentation is cephalic. Length of Stay:  2  Days  Subjective: Ms Baldomero Lamy has no complaints this morning Patient reports good fetal movement.  She reports no uterine contractions, no bleeding and no loss of fluid per vagina.  Vitals:  Blood pressure (!) 105/51, pulse 90, temperature 98 F (36.7 C), temperature source Oral, resp. rate 17, last menstrual period 11/17/2019, SpO2 98 %, unknown if currently breastfeeding. Physical Examination: Lungs clear Heart RRR Abd soft + BS gravid non tender Ext non tender Fetal Monitoring:  Baseline: 130's bpm, accels, no decels,   Labs:  Results for orders placed or performed during the hospital encounter of 07/17/20 (from the past 24 hour(s))  Glucose, capillary   Collection Time: 07/18/20  5:01 PM  Result Value Ref Range   Glucose-Capillary 248 (H) 70 - 99 mg/dL  Glucose, capillary   Collection Time: 07/18/20  6:20 PM  Result Value Ref Range   Glucose-Capillary 286 (H) 70 - 99 mg/dL  Glucose, capillary   Collection Time: 07/18/20  6:55 PM  Result Value Ref Range   Glucose-Capillary 222 (H) 70 - 99 mg/dL  Glucose, capillary   Collection Time: 07/18/20  7:34 PM  Result Value Ref Range   Glucose-Capillary 201 (H) 70 - 99 mg/dL  Glucose, capillary   Collection Time: 07/18/20  8:10 PM  Result Value Ref Range   Glucose-Capillary 222 (H) 70 - 99 mg/dL  Glucose, capillary   Collection Time: 07/18/20  8:52 PM  Result Value Ref Range   Glucose-Capillary 194 (H) 70 - 99 mg/dL  Glucose, capillary   Collection Time: 07/18/20  9:58 PM  Result Value Ref Range   Glucose-Capillary 198 (H) 70 - 99 mg/dL  Glucose, capillary   Collection Time: 07/18/20 10:39 PM  Result Value Ref Range    Glucose-Capillary 168 (H) 70 - 99 mg/dL  Glucose, capillary   Collection Time: 07/18/20 11:53 PM  Result Value Ref Range   Glucose-Capillary 155 (H) 70 - 99 mg/dL  Glucose, capillary   Collection Time: 07/19/20  1:03 AM  Result Value Ref Range   Glucose-Capillary 146 (H) 70 - 99 mg/dL  Glucose, capillary   Collection Time: 07/19/20  2:03 AM  Result Value Ref Range   Glucose-Capillary 126 (H) 70 - 99 mg/dL  Glucose, capillary   Collection Time: 07/19/20  3:10 AM  Result Value Ref Range   Glucose-Capillary 149 (H) 70 - 99 mg/dL  Basic metabolic panel   Collection Time: 07/19/20  4:14 AM  Result Value Ref Range   Sodium 136 135 - 145 mmol/L   Potassium 3.8 3.5 - 5.1 mmol/L   Chloride 104 98 - 111 mmol/L   CO2 21 (L) 22 - 32 mmol/L   Glucose, Bld 134 (H) 70 - 99 mg/dL   BUN 6 6 - 20 mg/dL   Creatinine, Ser 0.79 0.44 - 1.00 mg/dL   Calcium 9.0 8.9 - 10.3 mg/dL   GFR, Estimated >60 >60 mL/min   Anion gap 11 5 - 15  Glucose, capillary   Collection Time: 07/19/20  4:20 AM  Result Value Ref Range   Glucose-Capillary 118 (H) 70 - 99 mg/dL  Glucose, capillary   Collection Time: 07/19/20  5:26  AM  Result Value Ref Range   Glucose-Capillary 124 (H) 70 - 99 mg/dL  Glucose, capillary   Collection Time: 07/19/20  6:32 AM  Result Value Ref Range   Glucose-Capillary 119 (H) 70 - 99 mg/dL  Glucose, capillary   Collection Time: 07/19/20  7:29 AM  Result Value Ref Range   Glucose-Capillary 116 (H) 70 - 99 mg/dL  Glucose, capillary   Collection Time: 07/19/20  8:32 AM  Result Value Ref Range   Glucose-Capillary 103 (H) 70 - 99 mg/dL  Glucose, capillary   Collection Time: 07/19/20 10:37 AM  Result Value Ref Range   Glucose-Capillary 123 (H) 70 - 99 mg/dL    Imaging Studies:    BPP and growth scan today   Medications:  Scheduled . aspirin EC  81 mg Oral Daily  . docusate sodium  100 mg Oral Daily  . enoxaparin (LOVENOX) injection  40 mg Subcutaneous Q24H  . famotidine  20 mg  Oral BID  . insulin aspart  0-20 Units Subcutaneous TID WC  . insulin aspart  6 Units Subcutaneous TID WC  . insulin NPH Human  30 Units Subcutaneous BID AC & HS  . prenatal multivitamin  1 tablet Oral Q1200  . ursodiol  600 mg Oral TID   I have reviewed the patient's current medications.  ASSESSMENT: IUP 35 0/7 weeks Abdominal trauma, S/P fall Cholestasis in pregnancy Acute relapse of MS  PLAN: Stable. Appreciate MFM consult. Discussed with pt, IP neurology consult but she declined. Desires to follow plan of her outpt neurologist. Which has been previously outlined in the chart.  Potential benefits of delaying IOL reviewed with pt. She declines delay of IOL. DM educator input appreciated. Growth Scan and BPP today. Fetal status remains reassuring.  PT evaluation. Desires post placental IUD for contraception.  Continue routine antenatal care.   Chancy Milroy 07/19/2020,11:33 AM

## 2020-07-19 NOTE — Progress Notes (Signed)
I checked in on [name] after she learned she will be here for several days.  She reported that she is doing okay.  It is hard for her that she has 4 other children at home that she can't be with currently (ages, 49, 46 and twins age 37).  They are currently with their dad.  She was in the hospital for several months during her previous pregnancy, so she is able to cope with being here for a few days.  She plans to rest and is grateful for the quiet.  Her husband and cousin (baby's godmother) are supportive and they are the designated visitors.  [place], [place] Pager, [phone] 2:47 PM

## 2020-07-19 NOTE — Progress Notes (Signed)
Paged diabetes coordinator. Waiting to hear back.

## 2020-07-19 NOTE — Progress Notes (Signed)
Inpatient Diabetes Program Recommendations  AACE/ADA: New Consensus Statement on Inpatient Glycemic Control (2015)  Target Ranges:  Prepandial:   less than 140 mg/dL      Peak postprandial:   less than 180 mg/dL (1-2 hours)      Critically ill patients:  140 - 180 mg/dL   Lab Results  Component Value Date   GLUCAP 103 (H) 07/19/2020    Review of Glycemic Control Results for [name], [name] (MRN [id number]) as of 07/19/2020 10:34  Ref. Range 07/19/2020 04:20 07/19/2020 05:26 07/19/2020 06:32 07/19/2020 07:29 07/19/2020 08:32  Glucose-Capillary Latest Ref Range: 70 - 99 mg/dL 118 (H) 124 (H) 119 (H) 116 (H) 103 (H)   Diabetes history:  No DM Outpatient Diabetes medications:  NA Current orders for Inpatient glycemic control:  IV insulin  Inpatient Diabetes Program Recommendations:    Elevated blood glucose due to steroids.  Steroids will continue through Sunday and will receive betamethasone on Monday and Tuesday.  Plan for delivery on Wednesday.  MD would like to transition off of IV insulin.  Please consider,  NPH 30 units BID Novolog 0-14 Q4H  Carb modified diet  Will continue to follow while inpatient.  Thank you, Reche Dixon, RN, BSN Diabetes Coordinator Inpatient Diabetes Program [phone] (team pager from 8a-5p)

## 2020-07-19 NOTE — Consult Note (Signed)
MFM Note  This patient has been hospitalized due to a fall secondary to an exacerbation of multiple sclerosis.  Her pregnancy has also been complicated by cholestasis of pregnancy.  The plan is for delivery next week once she completes her Solu-Medrol course and receives a complete course of antenatal corticosteroids.  The patient has declined an inpatient neurology consultation.  The fetal growth and amniotic fluid level appears appropriate for her gestational age (EFW 5 pounds 11 ounces, 49th percentile).  Abdominal calcifications continue to be noted in the fetal abdomen.  The significance of this finding remains undetermined.  Her baby should be examined after delivery to determine if any treatment is necessary.    A biophysical profile performed today was 8 out of 8.   We will continue the plan for inpatient management until delivery next week.

## 2020-07-19 NOTE — Progress Notes (Signed)
Spoke with Dr. Rip Harbour regarding transition off of endotool. Received verbal order to give 30 units of NPH and turn off endotool one hour following administration of NPH.

## 2020-07-20 LAB — BASIC METABOLIC PANEL
Anion gap: 11 (ref 5–15)
BUN: 7 mg/dL (ref 6–20)
CO2: 22 mmol/L (ref 22–32)
Calcium: 8.8 mg/dL — ABNORMAL LOW (ref 8.9–10.3)
Chloride: 104 mmol/L (ref 98–111)
Creatinine, Ser: 0.62 mg/dL (ref 0.44–1.00)
GFR, Estimated: >60 mL/min (ref >60)
Glucose, Bld: 85 mg/dL (ref 70–99)
Potassium: 3.5 mmol/L (ref 3.5–5.1)
Sodium: 137 mmol/L (ref 135–145)

## 2020-07-20 LAB — GLUCOSE, CAPILLARY
Glucose-Capillary: 73 mg/dL (ref 70–99)
Glucose-Capillary: 124 mg/dL — ABNORMAL HIGH (ref 70–99)
Glucose-Capillary: 217 mg/dL — ABNORMAL HIGH (ref 70–99)
Glucose-Capillary: 139 mg/dL — ABNORMAL HIGH (ref 70–99)

## 2020-07-20 NOTE — Progress Notes (Signed)
Patient ID: [name], female   DOB: 03/23/1986, 35 y.o.   MRN: [id number] [place] COMPREHENSIVE PROGRESS NOTE  [name] is a 35 y.o. [name] at [redacted]w[redacted]d  who is admitted for observation following a fall and a acute relapse of MS.   Fetal presentation is cephalic. Length of Stay:  3  Days  Subjective: Pt without complaints this morning.  Patient reports good fetal movement.  She reports no uterine contractions, no bleeding and no loss of fluid per vagina.  Vitals:  Blood pressure 119/61, pulse 99, temperature 98.1 F (36.7 C), temperature source Oral, resp. rate 16, last menstrual period 11/17/2019, SpO2 97 %, unknown if currently breastfeeding. Physical Examination: Lungs clear Heart RRR Abd soft + BS gravid Ext non tender  Fetal Monitoring:  140's, + acels, no decels, no ut ctx  Labs:  Results for orders placed or performed during the hospital encounter of 07/17/20 (from the past 24 hour(s))  Glucose, capillary   Collection Time: 07/19/20  8:32 AM  Result Value Ref Range   Glucose-Capillary 103 (H) 70 - 99 mg/dL  Glucose, capillary   Collection Time: 07/19/20 10:37 AM  Result Value Ref Range   Glucose-Capillary 123 (H) 70 - 99 mg/dL  Glucose, capillary   Collection Time: 07/19/20 11:54 AM  Result Value Ref Range   Glucose-Capillary 89 70 - 99 mg/dL  Glucose, capillary   Collection Time: 07/19/20  1:01 PM  Result Value Ref Range   Glucose-Capillary 74 70 - 99 mg/dL  Glucose, capillary   Collection Time: 07/19/20  6:01 PM  Result Value Ref Range   Glucose-Capillary 156 (H) 70 - 99 mg/dL  Glucose, capillary   Collection Time: 07/19/20  8:58 PM  Result Value Ref Range   Glucose-Capillary 198 (H) 70 - 99 mg/dL   Comment 1 Notify RN    Comment 2 Document in Chart   Basic metabolic panel   Collection Time: 07/20/20  5:48 AM  Result Value Ref Range   Sodium 137 135 - 145 mmol/L   Potassium 3.5 3.5 - 5.1 mmol/L   Chloride 104 98 - 111 mmol/L    CO2 22 22 - 32 mmol/L   Glucose, Bld 85 70 - 99 mg/dL   BUN 7 6 - 20 mg/dL   Creatinine, Ser 0.62 0.44 - 1.00 mg/dL   Calcium 8.8 (L) 8.9 - 10.3 mg/dL   GFR, Estimated >60 >60 mL/min   Anion gap 11 5 - 15  Glucose, capillary   Collection Time: 07/20/20  7:01 AM  Result Value Ref Range   Glucose-Capillary 73 70 - 99 mg/dL    Imaging Studies:    BPP 8/8 yesterday Growth 49 % vertex   Medications:  Scheduled . aspirin EC  81 mg Oral Daily  . docusate sodium  100 mg Oral Daily  . enoxaparin (LOVENOX) injection  0.5 mg/kg Subcutaneous Q24H  . famotidine  20 mg Oral BID  . insulin aspart  0-20 Units Subcutaneous TID WC  . insulin aspart  6 Units Subcutaneous TID WC  . insulin NPH Human  30 Units Subcutaneous BID AC & HS  . prenatal multivitamin  1 tablet Oral Q1200  . ursodiol  600 mg Oral TID   I have reviewed the patient's current medications.  ASSESSMENT: IUP 35 1/7 weeks Abdominal trauma, S/P fall Cholestasis in pregnancy Acute relapse of MS  PLAN: Stable. Continue day 3 of 5 Solumedrol for MS. CBG's much improved with current insulin regiment. Delivery  on Wednesday. Pt desires post placenta IUD Continue routine antenatal care.   Chancy Milroy 07/20/2020,7:31 AM

## 2020-07-21 DIAGNOSIS — Z3A35 35 weeks gestation of pregnancy: Secondary | ICD-10-CM

## 2020-07-21 DIAGNOSIS — O24113 Pre-existing diabetes mellitus, type 2, in pregnancy, third trimester: Secondary | ICD-10-CM

## 2020-07-21 LAB — BASIC METABOLIC PANEL
Anion gap: 13 (ref 5–15)
BUN: 7 mg/dL (ref 6–20)
CO2: 20 mmol/L — ABNORMAL LOW (ref 22–32)
Calcium: 9 mg/dL (ref 8.9–10.3)
Chloride: 105 mmol/L (ref 98–111)
Creatinine, Ser: 0.78 mg/dL (ref 0.44–1.00)
GFR, Estimated: >60 mL/min (ref >60)
Glucose, Bld: 147 mg/dL — ABNORMAL HIGH (ref 70–99)
Potassium: 3.4 mmol/L — ABNORMAL LOW (ref 3.5–5.1)
Sodium: 138 mmol/L (ref 135–145)

## 2020-07-21 LAB — GLUCOSE, CAPILLARY
Glucose-Capillary: 157 mg/dL — ABNORMAL HIGH (ref 70–99)
Glucose-Capillary: 108 mg/dL — ABNORMAL HIGH (ref 70–99)
Glucose-Capillary: 128 mg/dL — ABNORMAL HIGH (ref 70–99)
Glucose-Capillary: 210 mg/dL — ABNORMAL HIGH (ref 70–99)

## 2020-07-21 MED ORDER — POTASSIUM CHLORIDE CRYS ER 20 MEQ PO TBCR
20.0000 meq | EXTENDED_RELEASE_TABLET | Freq: Two times a day (BID) | ORAL | Status: AC
  Administered 2020-07-21 – 2020-07-22 (×4): 20 meq via ORAL
  Filled 2020-07-21 (×4): qty 1

## 2020-07-21 MED ORDER — BETAMETHASONE SOD PHOS & ACET 6 (3-3) MG/ML IJ SUSP
12.0000 mg | INTRAMUSCULAR | Status: AC
  Administered 2020-07-22 – 2020-07-23 (×2): 12 mg via INTRAMUSCULAR
  Filled 2020-07-21: qty 5

## 2020-07-21 MED ORDER — INSULIN ASPART 100 UNIT/ML ~~LOC~~ SOLN: 0.0000 [IU] | Freq: Three times a day (TID) | SUBCUTANEOUS | Status: DC

## 2020-07-21 MED ORDER — ENOXAPARIN SODIUM 60 MG/0.6ML ~~LOC~~ SOLN
0.5000 mg/kg | SUBCUTANEOUS | Status: AC
  Administered 2020-07-21 – 2020-07-23 (×3): 60 mg via SUBCUTANEOUS
  Filled 2020-07-21 (×3): qty 0.6

## 2020-07-21 MED ORDER — INSULIN ASPART 100 UNIT/ML ~~LOC~~ SOLN
0.0000 [IU] | Freq: Three times a day (TID) | SUBCUTANEOUS | Status: DC
  Administered 2020-07-21: 7 [IU] via SUBCUTANEOUS
  Administered 2020-07-21 – 2020-07-22 (×3): 3 [IU] via SUBCUTANEOUS
  Administered 2020-07-22: 5 [IU] via SUBCUTANEOUS
  Administered 2020-07-23: 3 [IU] via SUBCUTANEOUS
  Administered 2020-07-23: 5 [IU] via SUBCUTANEOUS
  Administered 2020-07-24: 3 [IU] via SUBCUTANEOUS

## 2020-07-21 NOTE — Progress Notes (Signed)
Daily Antepartum Note  Admission Date: 07/17/2020 Current Date: 07/21/2020 8:27 AM  [name] is a 35 y.o. [id number] @ [redacted]w[redacted]d, admitted for MS flare.  Pregnancy complicated by: Patient Active Problem List   Diagnosis Date Noted  . Abnormal fetal ultrasound 07/12/2020  . 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 Maternal morbid obesity, antepartum ([name]) 07/11/2020  . BMI 40.0-44.9, adult ([name]) 07/11/2020  . Fall on gravid abdomen in third trimester 07/11/2020  . Hx of preeclampsia, prior pregnancy, currently pregnant 05/23/2020  . Supervision of high-risk pregnancy 02/20/2020  . History of reversal of tubal ligation 12/20/2019  . Adenomyosis 05/09/2019  . Anxiety 11/28/2018  . Endometriosis 05/23/2018  . Fibroid of cervix   . Cervical mass 12/23/2016  . Child behavior problem 03/27/2016  . Malabsorption 08/29/2013  . Acute relapsing multiple sclerosis ([name]) 07/22/2013  . B12 deficiency 06/27/2013  . Insomnia 06/27/2013  . Adjustment disorder with mixed anxiety and depressed mood 03/13/2013  . Multiple sclerosis affecting pregnancy in third trimester ([name]) 03/13/2013    Overnight/24hr events:  None  Subjective:  No OB s/s, no itching. Pt thinks her leg s/s are much better  Objective:    Current Vital Signs 24h Vital Sign Ranges  T 98.4 F (36.9 C) Temp  Avg: 98.2 F (36.8 C)  Min: 97.9 F (36.6 C)  Max: 98.4 F (36.9 C)  BP 122/64 BP  Min: 108/60  Max: 122/64  HR 98 Pulse  Avg: 100  Min: 96  Max: 104  RR 16 Resp  Avg: 16.8  Min: 16  Max: 18  SaO2 97 % Room Air SpO2  Avg: 98.4 %  Min: 97 %  Max: 100 %       24 Hour I/O Current Shift I/O  Time Ins Outs No intake/output data recorded. No intake/output data recorded.   Patient Vitals for the past 24 hrs:  BP Temp Temp src Pulse Resp SpO2  07/21/20 0426 122/64 98.4 F (36.9 C) Oral 98 16 97 %  07/20/20 2306 108/60 98.1 F (36.7 C) Oral (!) 104 16 98 %  07/20/20 2033 (!) 110/46 98.4 F (36.9  C) Oral (!) 103 16 97 %  07/20/20 1719 (!) 120/59 98.1 F (36.7 C) Oral 99 18 100 %  07/20/20 1106 (!) 117/55 97.9 F (36.6 C) Oral 96 18 100 %   FHT: 130 baseline, +accels, no decel, mod variability Toco: quiet  Physical exam: General: Well nourished, well developed female in no acute distress. Abdomen: gravid nttp Respiratory: no respiratory distress Skin: Warm and dry.   Medications: Current Facility-Administered Medications  Medication Dose Route Frequency Provider Last Rate Last Admin  . acetaminophen (TYLENOL) tablet 650 mg  650 mg Oral Q4H PRN Constant, Peggy, MD   650 mg at 07/20/20 1742  . butalbital-acetaminophen-caffeine (FIORICET) 50-325-40 MG per tablet 1 tablet  1 tablet Oral Q4H PRN Constant, Peggy, MD   1 tablet at 07/19/20 1958  . calcium carbonate (TUMS - dosed in mg elemental calcium) chewable tablet 400 mg of elemental calcium  2 tablet Oral Q4H PRN Constant, Peggy, MD      . docusate sodium (COLACE) capsule 100 mg  100 mg Oral Daily Constant, Peggy, MD   100 mg at 07/20/20 0835  . enoxaparin (LOVENOX) injection 60 mg  0.5 mg/kg Subcutaneous Q24H Chancy Milroy, MD   60 mg at 07/20/20 1129  . famotidine (PEPCID) tablet 20 mg  20 mg Oral BID Constant, Vickii Chafe, MD  20 mg at 07/20/20 2216  . hydrOXYzine (ATARAX/VISTARIL) tablet 25 mg  25 mg Oral Q6H PRN Constant, Peggy, MD   25 mg at 07/20/20 1742  . insulin aspart (novoLOG) injection 0-20 Units  0-20 Units Subcutaneous TID WC Chancy Milroy, MD   3 Units at 07/20/20 2223  . insulin NPH Human (NOVOLIN N) injection 30 Units  30 Units Subcutaneous BID AC & HS Chancy Milroy, MD   30 Units at 07/20/20 2218  . lactated ringers infusion   Intravenous Continuous Chancy Milroy, MD      . methylPREDNISolone sodium succinate (SOLU-MEDROL) 1,000 mg in sodium chloride 0.9 % 50 mL IVPB  1,000 mg Intravenous Daily Constant, Peggy, MD 58 mL/hr at 07/20/20 1001 1,000 mg at 07/20/20 1001  . prenatal multivitamin tablet 1 tablet   1 tablet Oral Q1200 Constant, Peggy, MD   1 tablet at 07/20/20 1128  . promethazine (PHENERGAN) tablet 12.5-25 mg  12.5-25 mg Oral Q6H PRN Constant, Peggy, MD      . ursodiol (ACTIGALL) capsule 600 mg  600 mg Oral TID Florian Buff, MD   600 mg at 07/20/20 2216  . zolpidem (AMBIEN) tablet 5 mg  5 mg Oral QHS PRN Griffin Basil, MD   5 mg at 07/19/20 2233    Labs:  Recent Labs  Lab 07/17/20 1657  WBC 9.4  HGB 10.0*  HCT 32.9*  PLT 409*    Recent Labs  Lab 07/19/20 0414 07/20/20 0548 07/21/20 0514  NA 136 137 138  K 3.8 3.5 3.4*  CL 104 104 105  CO2 21* 22 20*  BUN 6 7 7   CREATININE 0.79 0.62 0.78  CALCIUM 9.0 8.8* 9.0  GLUCOSE 134* 85 147*   Results for [name], [name] (MRN [id number]) as of 07/21/2020 08:29  Ref. Range 07/20/2020 11:04 07/20/2020 17:01 07/20/2020 22:08 07/21/2020 05:14 07/21/2020 05:38  Glucose-Capillary Latest Ref Range: 70 - 99 mg/dL 124 (H) 217 (H) 139 (H)  157 (H)    Radiology:  2/4: 2591gm, 49%, ac 85%, ceph, afi 11, bpp 8/8  Assessment & Plan:  Pt improving *Pregnancy: reactive NST. qday NSTs. Follow up for PP IUD and consent patient PRN *Neuro: pt declined consult. Day 5/5 iv solumedrol today. Continue with am fasting and 2h post prandials. Watch CBGs *Preterm: s/p MFM consult. Plan for bmz 2/7 and 2/8 and delivery on 2/9. Consult NICU PRN *cholestasis: continue actigall. Normal bpp on 2/4 *PPx: on lovenox. D/c prior to delivery *FEN/GI: dm2 diet, sliv *Dispo: see above  [name] MD [facility]ending [facility] [place] GYN Consult Phone: [phone] (M-F, 0800-1700) & [phone] (Off hours, weekends, holidays)

## 2020-07-22 ENCOUNTER — Other Ambulatory Visit (HOSPITAL_COMMUNITY): Payer: Self-pay | Admitting: Advanced Practice Midwife

## 2020-07-22 DIAGNOSIS — R739 Hyperglycemia, unspecified: Secondary | ICD-10-CM

## 2020-07-22 DIAGNOSIS — E538 Deficiency of other specified B group vitamins: Secondary | ICD-10-CM

## 2020-07-22 DIAGNOSIS — O99283 Endocrine, nutritional and metabolic diseases complicating pregnancy, third trimester: Secondary | ICD-10-CM

## 2020-07-22 DIAGNOSIS — R296 Repeated falls: Secondary | ICD-10-CM

## 2020-07-22 LAB — GLUCOSE, CAPILLARY
Glucose-Capillary: 131 mg/dL — ABNORMAL HIGH (ref 70–99)
Glucose-Capillary: 83 mg/dL (ref 70–99)
Glucose-Capillary: 104 mg/dL — ABNORMAL HIGH (ref 70–99)
Glucose-Capillary: 166 mg/dL — ABNORMAL HIGH (ref 70–99)
Glucose-Capillary: 131 mg/dL — ABNORMAL HIGH (ref 70–99)

## 2020-07-22 LAB — BASIC METABOLIC PANEL
Anion gap: 12 (ref 5–15)
BUN: 6 mg/dL (ref 6–20)
CO2: 21 mmol/L — ABNORMAL LOW (ref 22–32)
Calcium: 9.1 mg/dL (ref 8.9–10.3)
Chloride: 104 mmol/L (ref 98–111)
Creatinine, Ser: 0.7 mg/dL (ref 0.44–1.00)
GFR, Estimated: >60 mL/min (ref >60)
Glucose, Bld: 99 mg/dL (ref 70–99)
Potassium: 3.5 mmol/L (ref 3.5–5.1)
Sodium: 137 mmol/L (ref 135–145)

## 2020-07-22 MED ORDER — CYANOCOBALAMIN 1000 MCG/ML IJ SOLN
1000.0000 ug | Freq: Once | INTRAMUSCULAR | Status: AC
  Administered 2020-07-22: 1000 ug via INTRAMUSCULAR
  Filled 2020-07-22: qty 1

## 2020-07-22 MED ORDER — POLYETHYLENE GLYCOL 3350 17 G PO PACK
17.0000 g | PACK | Freq: Every day | ORAL | Status: DC
  Administered 2020-07-22: 17 g via ORAL
  Filled 2020-07-22 (×5): qty 1

## 2020-07-22 MED ORDER — BISACODYL 10 MG RE SUPP
10.0000 mg | Freq: Every day | RECTAL | Status: DC | PRN
  Filled 2020-07-22: qty 1

## 2020-07-22 NOTE — Plan of Care (Signed)
  Problem: Education: Goal: Knowledge of General Education information will improve Description: Including pain rating scale, medication(s)/side effects and non-pharmacologic comfort measures Outcome: Completed/Met   Problem: Activity: Goal: Risk for activity intolerance will decrease Outcome: Completed/Met   Problem: Nutrition: Goal: Adequate nutrition will be maintained Outcome: Completed/Met   Problem: Coping: Goal: Level of anxiety will decrease Outcome: Completed/Met   Problem: Elimination: Goal: Will not experience complications related to urinary retention Outcome: Completed/Met   Problem: Education: Goal: Knowledge of disease or condition will improve Outcome: Completed/Met   

## 2020-07-22 NOTE — Progress Notes (Addendum)
Patient ID: [name], female   DOB: [birth year], 35 y.o.   MRN: [id number] [place]) NOTE  [name] is a 35 y.o. [id number] at [redacted]w[redacted]d by best clinical estimate who is admitted for recurrent falls and MS exacerbation.   Fetal presentation is cephalic. 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 Increase dosing of insulin as needed For IOL 07/24/2020 Repeat B12 again today given sx's, pregnancy, and level. S/p Solumedrol x 5 days, with slight improvement. Will f/u with Neurology for resumption of Ocrevus following delivery Continue Actigall Wants IUD postplacentally Add Miralax and Dulcolax.  Subjective: C/o constipation. No BM x 3 days. On Colace which is not helping. Reports improvement in MS pain. Still with some numbness and tingling. Patient reports the fetal movement as active. Patient reports uterine contraction  activity as none. Patient reports  vaginal bleeding as none. Patient describes fluid per vagina as None.  Vitals:  Blood pressure (!) 98/51, pulse 86, temperature 98.2 F (36.8 C), temperature source Oral, resp. rate 16, last menstrual period 11/17/2019, SpO2 99 %, unknown if currently breastfeeding. Physical Examination:  General appearance - alert, well appearing, and in no distress Chest - normal effort Abdomen - gravid, non-tender Fundal Height:  size equals dates Extremities: Homans sign is negative, no sign of DVT  Membranes:intact  Fetal Monitoring:  Baseline: 130 bpm, Variability: Good {> 6 bpm), Accelerations: Reactive and Decelerations: Absent  Labs:  Results for orders placed or performed during the hospital  encounter of 07/17/20 (from the past 24 hour(s))  Glucose, capillary   Collection Time: 07/21/20  5:04 PM  Result Value Ref Range   Glucose-Capillary 210 (H) 70 - 99 mg/dL   Comment 1 Notify RN    Comment 2 Document in Chart   Glucose, capillary   Collection Time: 07/21/20 11:51 PM  Result Value Ref Range   Glucose-Capillary 131 (H) 70 - 99 mg/dL  Basic metabolic panel   Collection Time: 07/22/20  4:29 AM  Result Value Ref Range   Sodium 137 135 - 145 mmol/L   Potassium 3.5 3.5 - 5.1 mmol/L   Chloride 104 98 - 111 mmol/L   CO2 21 (L) 22 - 32 mmol/L   Glucose, Bld 99 70 - 99 mg/dL   BUN 6 6 - 20 mg/dL   Creatinine, Ser 0.70 0.44 - 1.00 mg/dL   Calcium 9.1 8.9 - 10.3 mg/dL   GFR, Estimated >60 >60 mL/min   Anion gap 12 5 - 15  Glucose, capillary   Collection Time: 07/22/20  8:26 AM  Result Value Ref Range   Glucose-Capillary 83 70 - 99 mg/dL   Comment 1 Notify RN    Comment 2 Document in Chart   Glucose, capillary   Collection Time: 07/22/20 11:05 AM  Result Value Ref Range   Glucose-Capillary 104 (H) 70 - 99 mg/dL    Medications:  Scheduled . betamethasone acetate-betamethasone sodium phosphate  12 mg Intramuscular Q24 Hr x 2  . docusate sodium  100 mg Oral Daily  . enoxaparin (LOVENOX) injection  0.5 mg/kg Subcutaneous Q24H  . famotidine  20 mg Oral BID  . insulin aspart  0-17 Units Subcutaneous  TID PC  . insulin NPH Human  30 Units Subcutaneous BID AC & HS  . potassium chloride  20 mEq Oral BID  . prenatal multivitamin  1 tablet Oral Q1200  . ursodiol  600 mg Oral TID   I have reviewed the patient's current medications.   Donnamae Jude, MD 07/22/2020,12:47 PM

## 2020-07-23 LAB — BASIC METABOLIC PANEL
Anion gap: 12 (ref 5–15)
BUN: 6 mg/dL (ref 6–20)
CO2: 21 mmol/L — ABNORMAL LOW (ref 22–32)
Calcium: 8.8 mg/dL — ABNORMAL LOW (ref 8.9–10.3)
Chloride: 105 mmol/L (ref 98–111)
Creatinine, Ser: 0.63 mg/dL (ref 0.44–1.00)
GFR, Estimated: >60 mL/min (ref >60)
Glucose, Bld: 102 mg/dL — ABNORMAL HIGH (ref 70–99)
Potassium: 3.6 mmol/L (ref 3.5–5.1)
Sodium: 138 mmol/L (ref 135–145)

## 2020-07-23 LAB — GLUCOSE, CAPILLARY
Glucose-Capillary: 90 mg/dL (ref 70–99)
Glucose-Capillary: 150 mg/dL — ABNORMAL HIGH (ref 70–99)
Glucose-Capillary: 172 mg/dL — ABNORMAL HIGH (ref 70–99)

## 2020-07-23 NOTE — Progress Notes (Signed)
Pt just received her dinner and beginning to eat.

## 2020-07-23 NOTE — Progress Notes (Signed)
Patient ID: [name], female   DOB: 27-Aug-1985, 35 y.o.   MRN: [id number] [place]) NOTE  [name] is a 35 y.o. [id number] at [redacted]w[redacted]d by early ultrasound who is admitted for recurrent falls and MS exacerbation.   Fetal presentation is cephalic. Length of Stay:  6  Days  Subjective: Numbness and tingling Patient reports the fetal movement as active. Patient reports uterine contraction  activity as none. Patient reports  vaginal bleeding as none. Patient describes fluid per vagina as None.  Vitals:  Blood pressure (!) 112/52, pulse 81, temperature 98.2 F (36.8 C), temperature source Oral, resp. rate 16, height 5\' 4"  (1.626 m), weight 109.8 kg, last menstrual period 11/17/2019, SpO2 100 %, unknown if currently breastfeeding. Physical Examination:  General appearance - alert, well appearing, and in no distress Heart - normal rate and regular rhythm Abdomen - soft, nontender, nondistended Fundal Height:  size equals dates Cervical Exam: Not evaluated. Extremities: extremities normal, atraumatic, no cyanosis or edema and Homans sign is negative, no sign of DVT Membranes:intact   Fetal Monitoring:     Fetal Heart Rate A   Mode External  [removed] filed at 07/22/2020 2233  Baseline Rate (A) 125 bpm filed at 07/22/2020 2233  Variability 6-25 BPM filed at 07/22/2020 2233  Accelerations 15 x 15 filed at 07/22/2020 2233  Decelerations None filed at 07/22/2020 2233  Multiple birth? N filed at 07/22/2020 2233    Labs:  Results for orders placed or performed during the hospital encounter of 07/17/20 (from the past 24 hour(s))  Glucose, capillary   Collection Time: 07/22/20  8:26 AM  Result Value Ref Range   Glucose-Capillary 83 70 - 99 mg/dL   Comment 1 Notify RN    Comment 2 Document in Chart   Glucose, capillary   Collection Time: 07/22/20 11:05 AM  Result Value Ref Range   Glucose-Capillary 104 (H) 70 - 99 mg/dL  Glucose, capillary    Collection Time: 07/22/20  4:00 PM  Result Value Ref Range   Glucose-Capillary 166 (H) 70 - 99 mg/dL   Comment 1 Notify RN    Comment 2 Document in Chart   Glucose, capillary   Collection Time: 07/22/20 11:14 PM  Result Value Ref Range   Glucose-Capillary 131 (H) 70 - 99 mg/dL  Glucose, capillary   Collection Time: 07/23/20  6:12 AM  Result Value Ref Range   Glucose-Capillary 90 70 - 99 mg/dL  Basic metabolic panel   Collection Time: 07/23/20  6:36 AM  Result Value Ref Range   Sodium 138 135 - 145 mmol/L   Potassium 3.6 3.5 - 5.1 mmol/L   Chloride 105 98 - 111 mmol/L   CO2 21 (L) 22 - 32 mmol/L   Glucose, Bld 102 (H) 70 - 99 mg/dL   BUN 6 6 - 20 mg/dL   Creatinine, Ser 0.63 0.44 - 1.00 mg/dL   Calcium 8.8 (L) 8.9 - 10.3 mg/dL   GFR, Estimated >60 >60 mL/min   Anion gap 12 5 - 15     Medications:  Scheduled . betamethasone acetate-betamethasone sodium phosphate  12 mg Intramuscular Q24 Hr x 2  . docusate sodium  100 mg Oral Daily  . enoxaparin (LOVENOX) injection  0.5 mg/kg Subcutaneous Q24H  . famotidine  20 mg Oral BID  . insulin aspart  0-17 Units Subcutaneous TID PC  . insulin NPH Human  30 Units Subcutaneous BID AC & HS  . polyethylene glycol  17 g Oral Daily  .  prenatal multivitamin  1 tablet Oral Q1200  . ursodiol  600 mg Oral TID   I have reviewed the patient's current medications.  ASSESSMENT: Patient Active Problem List   Diagnosis Date Noted  . Hyperglycemia 07/22/2020  . Abnormal fetal ultrasound 07/12/2020  . 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 Maternal morbid obesity, antepartum ([place]) 07/11/2020  . BMI 40.0-44.9, adult ([place]) 07/11/2020  . Fall on gravid abdomen in third trimester 07/11/2020  . Hx of preeclampsia, prior pregnancy, currently pregnant 05/23/2020  . Supervision of high-risk pregnancy 02/20/2020  . History of reversal of tubal ligation 12/20/2019  . Adenomyosis 05/09/2019  . Anxiety 11/28/2018  . Endometriosis  05/23/2018  . Fibroid of cervix   . Cervical mass 12/23/2016  . Child behavior problem 03/27/2016  . Malabsorption 08/29/2013  . Acute relapsing multiple sclerosis ([place]) 07/22/2013  . B12 deficiency 06/27/2013  . Insomnia 06/27/2013  . Adjustment disorder with mixed anxiety and depressed mood 03/13/2013  . Multiple sclerosis affecting pregnancy in third trimester ([place]) 03/13/2013    PLAN: Delivery is planned tomorrow  [name] 07/23/2020,7:32 AM

## 2020-07-23 NOTE — Plan of Care (Signed)
  Problem: Health Behavior/Discharge Planning: Goal: Ability to manage health-related needs will improve Outcome: Progressing   Problem: Safety: Goal: Ability to remain free from injury will improve Outcome: Progressing   

## 2020-07-24 ENCOUNTER — Encounter (HOSPITAL_COMMUNITY): Payer: Self-pay | Admitting: Obstetrics and Gynecology

## 2020-07-24 ENCOUNTER — Inpatient Hospital Stay (HOSPITAL_COMMUNITY): Payer: Medicare HMO | Admitting: Anesthesiology

## 2020-07-24 ENCOUNTER — Encounter (HOSPITAL_COMMUNITY): Payer: Medicare HMO

## 2020-07-24 DIAGNOSIS — Z30014 Encounter for initial prescription of intrauterine contraceptive device: Secondary | ICD-10-CM

## 2020-07-24 DIAGNOSIS — O99354 Diseases of the nervous system complicating childbirth: Secondary | ICD-10-CM

## 2020-07-24 DIAGNOSIS — Z3A35 35 weeks gestation of pregnancy: Secondary | ICD-10-CM

## 2020-07-24 DIAGNOSIS — Z975 Presence of (intrauterine) contraceptive device: Secondary | ICD-10-CM

## 2020-07-24 DIAGNOSIS — O411230 Chorioamnionitis, third trimester, not applicable or unspecified: Secondary | ICD-10-CM

## 2020-07-24 DIAGNOSIS — G35 Multiple sclerosis: Secondary | ICD-10-CM

## 2020-07-24 LAB — CULTURE, BETA STREP (GROUP B ONLY)

## 2020-07-24 LAB — GLUCOSE, CAPILLARY
Glucose-Capillary: 119 mg/dL — ABNORMAL HIGH (ref 70–99)
Glucose-Capillary: 151 mg/dL — ABNORMAL HIGH (ref 70–99)
Glucose-Capillary: 81 mg/dL (ref 70–99)
Glucose-Capillary: 89 mg/dL (ref 70–99)
Glucose-Capillary: 98 mg/dL (ref 70–99)

## 2020-07-24 LAB — CBC
HCT: 27.5 % — ABNORMAL LOW (ref 36.0–46.0)
Hemoglobin: 8.3 g/dL — ABNORMAL LOW (ref 12.0–15.0)
MCH: 30.2 pg (ref 26.0–34.0)
MCHC: 30.2 g/dL (ref 30.0–36.0)
MCV: 100 fL (ref 80.0–100.0)
Platelets: 343 10*3/uL (ref 150–400)
RBC: 2.75 MIL/uL — ABNORMAL LOW (ref 3.87–5.11)
RDW: 16.5 % — ABNORMAL HIGH (ref 11.5–15.5)
WBC: 14 10*3/uL — ABNORMAL HIGH (ref 4.0–10.5)
nRBC: 0.9 % — ABNORMAL HIGH (ref 0.0–0.2)

## 2020-07-24 LAB — TYPE AND SCREEN
ABO/RH(D): A POS
Antibody Screen: NEGATIVE

## 2020-07-24 MED ORDER — LACTATED RINGERS IV SOLN: 500.0000 mL | INTRAVENOUS | Status: DC | PRN

## 2020-07-24 MED ORDER — PHENYLEPHRINE 40 MCG/ML (10ML) SYRINGE FOR IV PUSH (FOR BLOOD PRESSURE SUPPORT): 80.0000 ug | PREFILLED_SYRINGE | INTRAVENOUS | Status: DC | PRN

## 2020-07-24 MED ORDER — TERBUTALINE SULFATE 1 MG/ML IJ SOLN: 0.2500 mg | Freq: Once | INTRAMUSCULAR | Status: DC | PRN

## 2020-07-24 MED ORDER — INSULIN ASPART 100 UNIT/ML ~~LOC~~ SOLN: 0.0000 [IU] | Freq: Three times a day (TID) | SUBCUTANEOUS | Status: DC

## 2020-07-24 MED ORDER — FENTANYL-BUPIVACAINE-NACL 0.5-0.125-0.9 MG/250ML-% EP SOLN
EPIDURAL | Status: DC | PRN
  Administered 2020-07-24: 12 mL/h via EPIDURAL

## 2020-07-24 MED ORDER — LACTATED RINGERS IV SOLN: 500.0000 mL | Freq: Once | INTRAVENOUS | Status: DC

## 2020-07-24 MED ORDER — OXYTOCIN BOLUS FROM INFUSION
333.0000 mL | Freq: Once | INTRAVENOUS | Status: AC
  Administered 2020-07-24: 333 mL via INTRAVENOUS

## 2020-07-24 MED ORDER — LIDOCAINE HCL (PF) 1 % IJ SOLN: 30.0000 mL | INTRAMUSCULAR | Status: DC | PRN

## 2020-07-24 MED ORDER — FENTANYL-BUPIVACAINE-NACL 0.5-0.125-0.9 MG/250ML-% EP SOLN
12.0000 mL/h | EPIDURAL | Status: DC | PRN
  Filled 2020-07-24: qty 250

## 2020-07-24 MED ORDER — INSULIN NPH (HUMAN) (ISOPHANE) 100 UNIT/ML ~~LOC~~ SUSP
15.0000 [IU] | Freq: Two times a day (BID) | SUBCUTANEOUS | Status: DC
  Filled 2020-07-24: qty 10

## 2020-07-24 MED ORDER — LIDOCAINE HCL (PF) 1 % IJ SOLN
INTRAMUSCULAR | Status: DC | PRN
  Administered 2020-07-24: 5 mL via EPIDURAL

## 2020-07-24 MED ORDER — FENTANYL CITRATE (PF) 100 MCG/2ML IJ SOLN
50.0000 ug | INTRAMUSCULAR | Status: DC | PRN
  Administered 2020-07-24: 100 ug via INTRAVENOUS
  Filled 2020-07-24: qty 2

## 2020-07-24 MED ORDER — EPHEDRINE 5 MG/ML INJ: 10.0000 mg | INTRAVENOUS | Status: DC | PRN

## 2020-07-24 MED ORDER — LEVONORGESTREL 19.5 MCG/DAY IU IUD
INTRAUTERINE_SYSTEM | Freq: Once | INTRAUTERINE | Status: AC
  Administered 2020-07-24: 22:00:00 1 via INTRAUTERINE
  Filled 2020-07-24: qty 1

## 2020-07-24 MED ORDER — LACTATED RINGERS IV SOLN: INTRAVENOUS | Status: DC

## 2020-07-24 MED ORDER — ONDANSETRON HCL 4 MG/2ML IJ SOLN: 4.0000 mg | Freq: Four times a day (QID) | INTRAMUSCULAR | Status: DC | PRN

## 2020-07-24 MED ORDER — SOD CITRATE-CITRIC ACID 500-334 MG/5ML PO SOLN: 30.0000 mL | ORAL | Status: DC | PRN

## 2020-07-24 MED ORDER — OXYTOCIN-SODIUM CHLORIDE 30-0.9 UT/500ML-% IV SOLN
1.0000 m[IU]/min | INTRAVENOUS | Status: DC
  Administered 2020-07-24: 2 m[IU]/min via INTRAVENOUS

## 2020-07-24 MED ORDER — MISOPROSTOL 50MCG HALF TABLET
50.0000 ug | ORAL_TABLET | ORAL | Status: DC | PRN
  Administered 2020-07-24 (×2): 50 ug via BUCCAL
  Filled 2020-07-24 (×2): qty 1

## 2020-07-24 MED ORDER — OXYTOCIN-SODIUM CHLORIDE 30-0.9 UT/500ML-% IV SOLN
2.5000 [IU]/h | INTRAVENOUS | Status: DC
  Filled 2020-07-24: qty 500

## 2020-07-24 MED ORDER — DIPHENHYDRAMINE HCL 50 MG/ML IJ SOLN: 12.5000 mg | INTRAMUSCULAR | Status: DC | PRN

## 2020-07-24 NOTE — Progress Notes (Signed)
Labor Progress Note [name] is a 35 y.o. [id number] at [redacted]w[redacted]d presented 07/17/20 for abdominal cramping after a fall in the setting of MS flare, she was transferred to L&D today for IOL in the setting of recent MS flare and cholestasis of pregnancy.  S: Doing well without complaints.  O:  BP (!) 108/49   Pulse 68   Temp 98.6 F (37 C) (Oral)   Resp 18   Ht 5\' 4"  (1.626 m)   Wt 109.8 kg   LMP 11/17/2019   SpO2 97%   BMI 41.55 kg/m  EFM: baseline 130bpm/mod variability/+accels/no decels Toco: intermittent  CVE: Dilation: 1 Effacement (%): Thick Station: Ballotable Presentation: Vertex (verified via bedside US) Exam by:: Dr. Mare Loan confirmed by BSUS  A&P: 35 y.o. [id number] [redacted]w[redacted]d presented 07/17/20 for abdominal cramping after a fall in the setting of MS flare, she was transferred to L&D today for IOL in the setting of recent MS flare and cholestasis of pregnancy.  #IOL: Discussed IOL process with patient. Given cervical exam will dose cytotec and attempt FB placement at next check if able. #Pain: PRN, desires epidural #FWB: cat 1 #GBS negative  #contraception: desires post placental liletta, discussed risks/benefits with patient. Ordered/consents signed.  #Multiple sclerosis: follows with [facility]. Previously taking Ocrevus with good response, which was discontinued during this pregnancy. In the setting of her recent flare she was given a solumedrol course, which she has completed. She will follow up with [facility] postpartum.  #Steroid induced hyperglycemia: no history of diabetes, passed 2hr gtt. BGL elevated in the setting of recent steroid course. Previously NPH 30/30 and SSI ordered. Upon arrival to L&D decreased to NPH 15/15 and SSI. BGL q4 during latent labor and q2 during active labor.  #Cholestasis of pregnancy: last bile acids 07/11/20 were 13.8, ursodiol 600mg  TID.  #THC use: positive UDS this pregnancy, SW postpartum  #Anxiety: previous diagnosis of  anxiety, did have some trouble/feeling overwhelmed with prior pregnancy. Not on meds, SW consult postpartum.  [name], MD 9:49 AM

## 2020-07-24 NOTE — Anesthesia Procedure Notes (Signed)
Epidural Patient location during procedure: OB Start time: 07/24/2020 7:45 PM End time: 07/24/2020 8:00 PM  Staffing Anesthesiologist: Barnet Glasgow, MD Performed: anesthesiologist   Preanesthetic Checklist Completed: patient identified, IV checked, site marked, risks and benefits discussed, surgical consent, monitors and equipment checked, pre-op evaluation and timeout performed  Epidural Patient position: sitting Prep: DuraPrep and site prepped and draped Patient monitoring: continuous pulse ox and blood pressure Approach: midline Location: L3-L4 Injection technique: LOR air  Needle:  Needle type: Tuohy  Needle gauge: 17 G Needle length: 9 cm and 9 Needle insertion depth: 9 cm Catheter type: closed end flexible Catheter size: 19 Gauge Catheter at skin depth: 15 cm Test dose: negative  Assessment Events: blood not aspirated, injection not painful, no injection resistance, no paresthesia and negative IV test  Additional Notes Patient identified. Risks/Benefits/Options discussed with patient including but not limited to bleeding, infection, nerve damage, paralysis, failed block, incomplete pain control, headache, blood pressure changes, nausea, vomiting, reactions to medication both or allergic, itching and postpartum back pain. Confirmed with bedside nurse the patient's most recent platelet count. Confirmed with patient that they are not currently taking any anticoagulation, have any bleeding history or any family history of bleeding disorders. Patient expressed understanding and wished to proceed. All questions were answered. Sterile technique was used throughout the entire procedure. Please see nursing notes for vital signs. Test dose was given through epidural needle and negative prior to continuing to dose epidural or start infusion. Warning signs of high block given to the patient including shortness of breath, tingling/numbness in hands, complete motor block, or any concerning  symptoms with instructions to call for help. Patient was given instructions on fall risk and not to get out of bed. All questions and concerns addressed with instructions to call with any issues.1  Attempt (S) . Patient tolerated procedure well.

## 2020-07-24 NOTE — Progress Notes (Signed)
Labor Progress Note [name] is a 35 y.o. [id number] at [redacted]w[redacted]d presented 07/17/20 for abdominal cramping after a fall in the setting of MS flare, she was transferred to L&D today for IOL in the setting of recent MS flare and cholestasis of pregnancy.  S: Doing well without complaints.  O:  BP 123/67   Pulse 86   Temp 98.3 F (36.8 C) (Oral)   Resp 18   Ht 5\' 4"  (1.626 m)   Wt 109.8 kg   LMP 11/17/2019   SpO2 97%   BMI 41.55 kg/m  EFM: baseline 130bpm/mod variability/+accels/no decels Toco: q4-8 min  CVE: Dilation: 5 Effacement (%): 50 Station: -2 Presentation: Vertex Exam by:: Dr. Mare Loan confirmed by BSUS  A&P: 35 y.o. [id number] [redacted]w[redacted]d presented 07/17/20 for abdominal cramping after a fall in the setting of MS flare, she was transferred to L&D today for IOL in the setting of recent MS flare and cholestasis of pregnancy.  #IOL: S/p cytotec x2 and FB. AROM performed with clear fluid. Will start pitocin and continue to titrate. #Pain: PRN, desires epidural #FWB: cat 1 #GBS negative  #contraception: desires post placental liletta, discussed risks/benefits with patient. Ordered/consents signed.  #Multiple sclerosis: follows with [facility]. Previously taking Ocrevus with good response, which was discontinued during this pregnancy. In the setting of her recent flare she was given a solumedrol course, which she has completed. She will follow up with [facility] postpartum.  #Steroid induced hyperglycemia: no history of diabetes, passed 2hr gtt. BGL previously elevated in the setting of recent steroid course. Previously NPH 30/30 and SSI ordered. Discussed management with diabetes educator and given patient's decreased PO intake during labor course will discontinue all insulin orders and continue to monitor BGL.  BGL q4 during latent labor and q2 during active labor.  #Cholestasis of pregnancy: last bile acids 07/11/20 were 13.8, ursodiol 600mg  TID, discontinue  postpartum.  #THC use: positive UDS this pregnancy, SW postpartum  #Anxiety: previous diagnosis of anxiety, did have some trouble/feeling overwhelmed with prior pregnancy. Not on meds, SW consult postpartum.  [name], MD 6:44 PM

## 2020-07-24 NOTE — Discharge Summary (Signed)
Postpartum Discharge Summary  Date of Service updated2/11/22     Patient Name: [name] DOB: [birth year] MRN: [id number]  Date of admission: 07/17/2020 Delivery date:07/24/2020  Delivering provider: Janet [place]  Date of discharge: 07/26/2020  Admitting diagnosis: Abdominal trauma, initial encounter [S39.91XA] Intrauterine pregnancy: [redacted]w[redacted]d     Secondary diagnosis:  Active Problems:   Adjustment disorder with mixed anxiety and depressed mood   Multiple sclerosis affecting pregnancy in third trimester ([place])   Acute relapsing multiple sclerosis ([place])   B12 deficiency   Cervical mass   History of reversal of tubal ligation   Supervision of high-risk pregnancy   Hx of preeclampsia, prior pregnancy, currently pregnant   BMI 40.0-44.9, adult ([place])   Cholestasis during pregnancy in third trimester   Intrauterine contraceptive device  Additional problems: none    Discharge diagnosis: Preterm Pregnancy Delivered                                              Post partum procedures:post-placental IUD placed Augmentation: AROM, Pitocin, Cytotec and IP Foley Complications: None  Hospital course: Induction of Labor With Vaginal Delivery   35 y.o. yo [id number] at [redacted]w[redacted]d was admitted to the hospital 07/17/2020 for a fall secondary to suspected acute MS episode. She was also noted to have a severe B12 vitamin deficiency. Patient received a five day course of solumedrol. She received BMZ x2. She was noted to have steroid induced hyperglycemia with levels high enough to required endotool. She was subsequently transitioned to NPH 30/30. She was brought to L&D for induction of labor on 2/9. Indication for induction: Cholestasis of pregnancy and acute MS episode.  Patient had an uncomplicated labor course as follows: Membrane Rupture Time/Date: 6:41 PM ,07/24/2020   Delivery Method:Vaginal, Spontaneous  Episiotomy: None  Lacerations:  None  Details of delivery can be found in separate delivery  note.  Patient had a normal  postpartum course. Patient is discharged home 07/26/20.  Newborn Data: Birth date:07/24/2020  Birth time:9:33 PM  Gender:Female  Living status:Living  Apgars:8 ,9  [phone] g   Magnesium Sulfate received: No BMZ received: Yes Rhophylac:N/A MMR:N/A T-DaP:Given prenatally Flu: Yes Transfusion:No  Physical exam  Vitals:   07/25/20 0925 07/25/20 1410 07/25/20 2056 07/26/20 0615  BP: 123/72 117/74 (!) 124/56 118/76  Pulse: 67 64 77   Resp:  $Remo'18 20 18  'jsNEO$ Temp: 98.6 F (37 C) 98.1 F (36.7 C) 98 F (36.7 C) 98.2 F (36.8 C)  TempSrc: Oral Oral Oral Oral  SpO2:  100% 100% 100%  Weight:      Height:       General: alert, cooperative and no distress Lochia: appropriate Uterine Fundus: firm Incision: N/A DVT Evaluation: No evidence of DVT seen on physical exam. Labs: Lab Results  Component Value Date   WBC 14.0 (H) 07/24/2020   HGB 8.3 (L) 07/24/2020   HCT 27.5 (L) 07/24/2020   MCV 100.0 07/24/2020   PLT 343 07/24/2020   CMP Latest Ref Rng & Units 07/23/2020  Glucose 70 - 99 mg/dL 102(H)  BUN 6 - 20 mg/dL 6  Creatinine 0.44 - 1.00 mg/dL 0.63  Sodium 135 - 145 mmol/L 138  Potassium 3.5 - 5.1 mmol/L 3.6  Chloride 98 - 111 mmol/L 105  CO2 22 - 32 mmol/L 21(L)  Calcium 8.9 - 10.3 mg/dL 8.8(L)  Total Protein  6.5 - 8.1 g/dL -  Total Bilirubin 0.3 - 1.2 mg/dL -  Alkaline Phos 38 - 126 U/L -  AST 15 - 41 U/L -  ALT 0 - 44 U/L -   Edinburgh Score: No flowsheet data found.   After visit meds:  Allergies as of 07/26/2020   No Known Allergies     Medication List    STOP taking these medications   ursodiol 500 MG tablet Commonly known as: ACTIGALL     TAKE these medications   acetaminophen 325 MG tablet Commonly known as: TYLENOL Take 650 mg by mouth every 6 (six) hours as needed.   aspirin EC 81 MG tablet Take 1 tablet (81 mg total) by mouth daily. Take after 12 weeks for prevention of preeclampsia later in pregnancy   Blood  Pressure Kit Devi 1 kit by Does not apply route once a week. Check Blood Pressure regularly and record readings into the Babyscripts App.  Large Cuff.  DX O90.0   Comfort Fit Maternity Supp Med Misc 1 Device by Does not apply route daily.   famotidine 20 MG tablet Commonly known as: Pepcid Take 1 tablet (20 mg total) by mouth 2 (two) times daily.   hydrOXYzine 25 MG tablet Commonly known as: ATARAX/VISTARIL Take 1 tablet (25 mg total) by mouth every 6 (six) hours as needed for itching.   ibuprofen 600 MG tablet Commonly known as: ADVIL Take 1 tablet (600 mg total) by mouth every 6 (six) hours.   prenatal multivitamin Tabs tablet Take 1 tablet by mouth daily at 12 noon.        Discharge home in stable condition Infant Feeding: Bottle Infant Disposition:home with mother Discharge instruction: per After Visit Summary and Postpartum booklet. Activity: Advance as tolerated. Pelvic rest for 6 weeks.  Diet: routine diet Future Appointments: Future Appointments  Date Time Provider [place]  09/04/2020  8:45 AM [facility] None  09/04/2020  9:00 AM Griffin Basil, MD [place] None   Follow up Visit:  [place] Follow up in 4 week(s).   Specialty: Obstetrics and Gynecology Why: 09/04/20 at 9:00am Contact information: [address], [place] [phone]               Please schedule this patient for a In person postpartum visit in 6 weeks with the following provider: MD. (09/04/20 at 0900) Additional Postpartum F/U:2 hour GTT and 2 week IUD string check  High risk pregnancy complicated by: cholestasis, acute MS episode, B12 deficiency Delivery mode:  Vaginal, Spontaneous  Anticipated Birth Control:  PP IUD placed   07/26/2020 Hansel Feinstein, CNM

## 2020-07-24 NOTE — Progress Notes (Signed)
Labor Progress Note [name] is a 35 y.o. [id number] at [redacted]w[redacted]d presented 07/17/20 for abdominal cramping after a fall in the setting of MS flare, she was transferred to L&D today for IOL in the setting of recent MS flare and cholestasis of pregnancy.  S: Doing well without complaints.  O:  BP (!) 120/55   Pulse 75   Temp 98.3 F (36.8 C) (Oral)   Resp 18   Ht 5\' 4"  (1.626 m)   Wt 109.8 kg   LMP 11/17/2019   SpO2 97%   BMI 41.55 kg/m  EFM: baseline 140bpm/mod variability/+accels/no decels Toco: intermittent  CVE: Dilation: 1 Effacement (%): Thick Station: Ballotable Presentation: Vertex (verified via bedside US) Exam by:: Dr. Mare Loan confirmed by BSUS  A&P: 35 y.o. [id number] [redacted]w[redacted]d presented 07/17/20 for abdominal cramping after a fall in the setting of MS flare, she was transferred to L&D today for IOL in the setting of recent MS flare and cholestasis of pregnancy.  #IOL: S/p cytotec x1. Given cervical exam FB placed without difficulty, cytotec 46mcg buccal re-dosed. #Pain: PRN, desires epidural #FWB: cat 1 #GBS negative  #contraception: desires post placental liletta, discussed risks/benefits with patient. Ordered/consents signed.  #Multiple sclerosis: follows with [facility]. Previously taking Ocrevus with good response, which was discontinued during this pregnancy. In the setting of her recent flare she was given a solumedrol course, which she has completed. She will follow up with [facility] postpartum.  #Steroid induced hyperglycemia: no history of diabetes, passed 2hr gtt. BGL previously elevated in the setting of recent steroid course. Previously NPH 30/30 and SSI ordered. Discussed management with diabetes educator and given patient's decreased PO intake during labor course will discontinue all insulin orders and continue to monitor BGL.  BGL q4 during latent labor and q2 during active labor.  #Cholestasis of pregnancy: last bile acids 07/11/20 were 13.8,  ursodiol 600mg  TID, discontinue postpartum.  #THC use: positive UDS this pregnancy, SW postpartum  #Anxiety: previous diagnosis of anxiety, did have some trouble/feeling overwhelmed with prior pregnancy. Not on meds, SW consult postpartum.  [name], MD 2:48 PM

## 2020-07-24 NOTE — Anesthesia Preprocedure Evaluation (Addendum)
Anesthesia Evaluation  Patient identified by MRN, date of birth, ID band  Reviewed: Allergy & Precautions, NPO status , Patient's Chart, lab work & pertinent test results  Airway Mallampati: II  TM Distance: >3 FB Neck ROM: Full    Dental no notable dental hx. (+) Teeth Intact, Dental Advisory Given   Pulmonary    Pulmonary exam normal breath sounds clear to auscultation       Cardiovascular Normal cardiovascular exam Rhythm:Regular Rate:Normal     Neuro/Psych PSYCHIATRIC DISORDERS Anxiety MS admitted after fall and Flare up on Solumedrol  Initially had bilateral LE waekness, now almost back to baseline.  Neuromuscular disease    GI/Hepatic Neg liver ROS,  cholecystasis   Endo/Other  Morbid obesitySteroid hypergylcemia  Renal/GU      Musculoskeletal   Abdominal (+) + obese,   Peds  Hematology  (+) anemia , Lab Results      Component                Value               Date                      WBC                      14.0 (H)            07/24/2020                HGB                      8.3 (L)             07/24/2020                HCT                      27.5 (L)            07/24/2020                MCV                      100.0               07/24/2020                PLT                      343                 07/24/2020              Anesthesia Other Findings   Reproductive/Obstetrics (+) Pregnancy                            Anesthesia Physical Anesthesia Plan  ASA: III  Anesthesia Plan: Epidural   Post-op Pain Management:    Induction:   PONV Risk Score and Plan:   Airway Management Planned:   Additional Equipment:   Intra-op Plan:   Post-operative Plan:   Informed Consent: I have reviewed the patients History and Physical, chart, labs and discussed the procedure including the risks, benefits and alternatives for the proposed anesthesia with the patient or authorized  representative who has indicated his/her understanding and acceptance.       Plan Discussed with:  CRNA  Anesthesia Plan Comments: (35.5 wk G7P3 w MS and anemia)       Anesthesia Quick Evaluation

## 2020-07-24 NOTE — Consult Note (Signed)
NICU delivery team called to attend this vaginal delivery for 35 5/[redacted] weeks gestation.  Team was excused by Dr. Berniece Andreas upon arrival with infant crying vigorously on mother's belly.   Amedeo Gory, MD (Attending Neonatologist)

## 2020-07-24 NOTE — Progress Notes (Signed)
Inpatient Diabetes Program Recommendations  Diabetes Treatment Program Recommendations  ADA Standards of Care 2018 Diabetes in Pregnancy Target Glucose Ranges:  Fasting: 60 - 90 mg/dL Preprandial: 60 - 105 mg/dL 1 hr postprandial: Less than 140mg /dL (from first bite of meal) 2 hr postprandial: Less than 120 mg/dL (from first bite of meal)    Lab Results  Component Value Date   GLUCAP 98 07/24/2020    Review of Glycemic Control Results for [name], [name] (MRN [id number]) as of 07/24/2020 08:40  Ref. Range 07/23/2020 11:21 07/23/2020 14:32 07/24/2020 00:21 07/24/2020 06:15  Glucose-Capillary Latest Ref Range: 70 - 99 mg/dL 150 (H) 172 (H) 151 (H) 98   Diabetes history: No hx noted Outpatient Diabetes medications: none Current orders for Inpatient glycemic control: NPH 15 units BID, Novolog 0-17 units TID Received NPH 30 units QHS on 2/8 BMZ x2 on 2/7 & 2/8 Solumedrol 1000 mg QD prior   Inpatient Diabetes Program Recommendations:    Given plan for IOL and no further plan for steroids, would recommend discontinuing NPH and Novolog ssi as this may increase likelihood of hypoglycemia.  Instead, consider: CBGs Q4H until active labor, then Q2H. If 2 CBGs consistently > 120 mg/dL, would initiate IV insulin per Endotool to safely assess insulin needs.   Thanks, Bronson Curb, MSN, RNC-OB Diabetes Coordinator [phone] (8a-5p)

## 2020-07-24 NOTE — Procedures (Signed)
  Post-Placental IUD Insertion Procedure Note  Patient identified, informed consent signed prior to delivery, signed copy in chart, time out was performed.    Vaginal, labial and perineal areas thoroughly inspected for lacerations. No laceration identified.   Liletta - IUD grasped between sterile gloved fingers. Sterile lubrication applied to sterile gloved hand for ease of insertion. Fundus identified through abdominal wall using non-insertion hand. IUD inserted to fundus with bimanual technique. IUD carefully released at the fundus and insertion hand gently removed from vagina.     Strings trimmed to the level of the introitus. Patient tolerated procedure well.  Lot # [id number] Expiration Date 07/25/23  Patient given post procedure instructions and IUD care card with expiration date.  Patient is asked to keep IUD strings tucked in her vagina until her postpartum follow up visit in 4-6 weeks. Patient advised to abstain from sexual intercourse and pulling on strings before her follow-up visit. Patient verbalized an understanding of the plan of care and agrees.     Sharene Skeans, MD [facility] Family Medicine Fellow, [facility] for [facility], [place]

## 2020-07-25 ENCOUNTER — Encounter: Payer: Medicare HMO | Admitting: Obstetrics and Gynecology

## 2020-07-25 LAB — GLUCOSE, CAPILLARY: Glucose-Capillary: 96 mg/dL (ref 70–99)

## 2020-07-25 MED ORDER — PRENATAL MULTIVITAMIN CH: 1.0000 | ORAL_TABLET | Freq: Every day | ORAL | Status: DC

## 2020-07-25 MED ORDER — DIPHENHYDRAMINE HCL 25 MG PO CAPS
25.0000 mg | ORAL_CAPSULE | Freq: Four times a day (QID) | ORAL | Status: DC | PRN
Start: 2020-07-25 — End: 2020-07-26

## 2020-07-25 MED ORDER — ONDANSETRON HCL 4 MG PO TABS: 4.0000 mg | ORAL_TABLET | ORAL | Status: DC | PRN

## 2020-07-25 MED ORDER — TETANUS-DIPHTH-ACELL PERTUSSIS 5-2.5-18.5 LF-MCG/0.5 IM SUSY: 0.5000 mL | PREFILLED_SYRINGE | Freq: Once | INTRAMUSCULAR | Status: DC

## 2020-07-25 MED ORDER — ONDANSETRON HCL 4 MG/2ML IJ SOLN: 4.0000 mg | INTRAMUSCULAR | Status: DC | PRN

## 2020-07-25 MED ORDER — DOCUSATE SODIUM 100 MG PO CAPS
100.0000 mg | ORAL_CAPSULE | Freq: Two times a day (BID) | ORAL | Status: DC
  Administered 2020-07-25 – 2020-07-26 (×4): 100 mg via ORAL
  Filled 2020-07-25 (×4): qty 1

## 2020-07-25 MED ORDER — COCONUT OIL OIL
1.0000 | TOPICAL_OIL | Status: DC | PRN
Start: 2020-07-25 — End: 2020-07-26

## 2020-07-25 MED ORDER — DIBUCAINE (PERIANAL) 1 % EX OINT: 1.0000 "application " | TOPICAL_OINTMENT | CUTANEOUS | Status: DC | PRN

## 2020-07-25 MED ORDER — ACETAMINOPHEN 325 MG PO TABS: 650.0000 mg | ORAL_TABLET | ORAL | Status: DC | PRN

## 2020-07-25 MED ORDER — IBUPROFEN 600 MG PO TABS
600.0000 mg | ORAL_TABLET | Freq: Four times a day (QID) | ORAL | Status: DC
  Administered 2020-07-25 – 2020-07-26 (×7): 600 mg via ORAL
  Filled 2020-07-25 (×7): qty 1

## 2020-07-25 MED ORDER — BENZOCAINE-MENTHOL 20-0.5 % EX AERO: 1.0000 "application " | INHALATION_SPRAY | CUTANEOUS | Status: DC | PRN

## 2020-07-25 MED ORDER — SENNOSIDES-DOCUSATE SODIUM 8.6-50 MG PO TABS
2.0000 | ORAL_TABLET | ORAL | Status: DC
  Administered 2020-07-25: 2 via ORAL
  Filled 2020-07-25: qty 2

## 2020-07-25 MED ORDER — WITCH HAZEL-GLYCERIN EX PADS: 1.0000 "application " | MEDICATED_PAD | CUTANEOUS | Status: DC | PRN

## 2020-07-25 MED ORDER — SIMETHICONE 80 MG PO CHEW
80.0000 mg | CHEWABLE_TABLET | ORAL | Status: DC | PRN
  Administered 2020-07-25: 80 mg via ORAL
  Filled 2020-07-25: qty 1

## 2020-07-25 NOTE — Clinical Social Work Maternal (Signed)
CLINICAL SOCIAL WORK MATERNAL/CHILD NOTE  Patient Details  Name: [name] MRN: [id number] Date of Birth: 24-Jun-1985  Date:  07/25/2020  Clinical Social Worker Initiating Note:  Darra Lis, MSW, [place] Date/Time: Initiated:  07/25/20/0900     Child's Name:  [name]   Biological Parents:  Mother,Father [name])   Need for Interpreter:  None   Reason for Referral:  Current Substance Use/Substance Use During Pregnancy    Address:  [place] [address]    Phone number:  [phone] (home)     Additional phone number:   Household Members/Support Persons (HM/SP):   Household Member/Support Person 1,Household Member/Support Person 2,Household Member/Support Person 3,Household Member/Support Person 4,Household Member/Support Person 5   HM/SP Name Relationship DOB or Age  HM/SP -1 [name] Significant Other 06/26/1989  HM/SP -2 [name] Daughter 06/13/2003  HM/SP -3 [name] Daughter 05/31/2008  HM/SP -4 [name] Daughter 08/30/2010  HM/SP -5 [name] Daughter 08/30/2015  HM/SP -6        HM/SP -7        HM/SP -8          Natural Supports (not living in the home):  Extended Family   Professional Supports: None   Employment: Disabled   Type of Work:     Education:  [place] arranged:    Museum/gallery curator Resources:  [employer]    Other Resources:  Physicist, medical ,[place] Considerations Which May Impact Care:    Strengths:  Ability to meet basic needs ,Pediatrician chosen,Home prepared for child    Psychotropic Medications:         Pediatrician:    [place] area  Pediatrician List:   [place]      Pediatrician Fax Number:    Risk Factors/Current Problems:  None   Cognitive State:  Alert ,Insightful ,Goal Oriented ,Linear Thinking     Mood/Affect:  Calm ,Interested ,Happy    CSW Assessment: CSW consulted for THC use & a hx of anxiety. CSW met with MOB to complete assessment and offer support. CSW observed MOB holding newborn. CSW introduced self and role. MOB was pleasant and engaged throughout assessment. CSW informed MOB of reason for consult. MOB was understanding and reported she took a CBD gummy in August to assist with pregnancy symptoms. MOB reported she did not know THC was in the gummy and she has never used drugs. CSW informed MOB of the hospital drug screen policy. MOB was informed if baby test positive for substances other than what is prescribed, a CPS report will be required. CSW informed MOB that baby's UDS was positive for barbiturates, as a result of the Fioricet given during the hospital admission. MOB was informed a CPS notification is made as required for substance exposed newborns. MOB was understanding and denied any questions. MOB stated she lives with FOB and four other children. MOB receives disability due to her MS diagnosis, and utilizes [facility] and food stamp resources. MOB reported some experiences with anxiety as a result of her MS. MOB stated she was prescribed Zoloft in 2020 following the passing of her brother, but never took the medication. MOB disclosed she attended therapy in 2014 following the MS diagnosis. MOB reported she is currently doing well and denies any SI, HI or being involved in DV.  MOB stated she has a strong support system and considers her cousins to be supportive.   CSW provided education regarding the baby blues period vs. perinatal mood disorders, discussed treatment and gave resources for mental health follow up if concerns arise.  CSW recommends self-evaluation during the postpartum time period using the New Mom Checklist from Postpartum Progress and encouraged MOB to contact a medical professional if symptoms are noted at any time.   CSW provided review of Sudden Infant Death  Syndrome (SIDS) precautions.  MOB reported she has all essential needs for baby, including a bassinet. MOB has identified a pediatrician and denies any barriers to care. MOB expressed no additional needs at this time.   CSW made a CPS notification to [place]. CSW will continue to follow CDS and make an additional CPS report if warranted. CSW identifies no further need for intervention and no barriers to discharge at this time.  CSW Plan/Description:  No Further Intervention Required/No Barriers to Discharge,Sudden Infant Death Syndrome (SIDS) Education,Perinatal Mood and Anxiety Disorder (PMADs) Education,Hospital Drug Screen Policy Information,Child Protective Service Report ,CSW Will Continue to Monitor Umbilical Cord Tissue Drug Screen Results and Make Report if Larey Days 07/25/2020, 9:33 AM

## 2020-07-25 NOTE — Progress Notes (Addendum)
Post Partum Day 1 Subjective: No complaints, voiding, tolerating PO and + flatus. Reports that she has all of her neurology visits and management handled with [facility].   Objective: Blood pressure 127/80, pulse 69, temperature 98.4 F (36.9 C), temperature source Oral, resp. rate 17, height 5\' 4"  (1.626 m), weight 109.8 kg, last menstrual period 11/17/2019, SpO2 98 %, unknown if currently breastfeeding.  Physical Exam:  General: alert, cooperative and no distress Lochia: appropriate Uterine Fundus: firm Incision: N/A DVT Evaluation: No evidence of DVT seen on physical exam. Negative Homan's sign. No cords or calf tenderness. No significant calf/ankle edema.  Recent Labs    07/24/20 0457  HGB 8.3*  HCT 27.5*    Assessment/Plan: #PPD 1: Plan for discharge tomorrow. Social work consult and circumcision prior to discharge.  #MS: Follows w/ [facility]. Prior to pregnancy was on Ocrevus, and was placed on solumedrol course, which was completed. Will f/u with [facility] postpartum. #Steroid-induced hyperglycemia: No Hx of diabetes. AM BGL 96 on 07/25/20.  #THC use: Positive UDS this pregnancy. SW consult. #Anxiety: Previous dx of anxiety, and reported trouble/feeling overwhelmed with prior pregnancy. Not on meds. SW consult.    #RESOLVED Cholestasis of pregnancy: Last bile acids 07/11/20 were 13.8. Resolved on ursodiol 600 mg TID.    LOS: 8 days   Shade Flood 07/25/2020, 7:57 AM   I saw and evaluated the patient and repeated all components of HPI and exam. I agree with the findings and the plan of care as documented in the medical student's note.  Sharene Skeans, MD [facility] Family Medicine Fellow, [facility] for [facility], [place]

## 2020-07-25 NOTE — Care Management Important Message (Signed)
Important Message  Patient Details  Name: [name] MRN: [id number] Date of Birth: 1986-03-05   Medicare Important Message Given:  Yes     Shelda Altes 07/25/2020, 11:17 AM

## 2020-07-25 NOTE — Progress Notes (Signed)
Pt up to bathroom with strong steady gait.  Denies pain at this time.  Voided quantity sufficient without difficulty.  Peri care given with instructions.  Back to bed without incident.

## 2020-07-25 NOTE — Anesthesia Postprocedure Evaluation (Signed)
Anesthesia Post Note  Patient: [name]  Procedure(s) Performed: AN AD [place]     Patient location during evaluation: Mother Baby Anesthesia Type: Epidural Level of consciousness: awake and alert Pain management: pain level controlled Vital Signs Assessment: post-procedure vital signs reviewed and stable Respiratory status: spontaneous breathing, nonlabored ventilation and respiratory function stable Cardiovascular status: stable Postop Assessment: no headache, no backache and epidural receding Anesthetic complications: no   No complications documented.  Last Vitals:  Vitals:   07/25/20 0030 07/25/20 0925  BP: 127/80 123/72  Pulse: 69 67  Resp: 17   Temp: 36.9 C 37 C  SpO2: 98%     Last Pain:  Vitals:   07/25/20 0925  TempSrc: Oral  PainSc:    Pain Goal: Patients Stated Pain Goal: 0 (07/23/20 1958)                 Barkley Boards

## 2020-07-26 ENCOUNTER — Ambulatory Visit: Payer: Medicare HMO

## 2020-07-26 MED ORDER — IBUPROFEN 600 MG PO TABS: 600.0000 mg | ORAL_TABLET | Freq: Four times a day (QID) | ORAL | 0 refills | Status: DC

## 2020-07-26 NOTE — Discharge Instructions (Signed)

## 2020-07-29 LAB — SURGICAL PATHOLOGY

## 2020-08-01 ENCOUNTER — Ambulatory Visit: Payer: Self-pay | Admitting: *Deleted

## 2020-08-01 ENCOUNTER — Other Ambulatory Visit: Payer: Self-pay

## 2020-08-01 ENCOUNTER — Encounter (HOSPITAL_COMMUNITY): Payer: Self-pay | Admitting: Obstetrics and Gynecology

## 2020-08-01 ENCOUNTER — Inpatient Hospital Stay (HOSPITAL_COMMUNITY)
Admission: AD | Admit: 2020-08-01 | Discharge: 2020-08-01 | Discharge status: Against Medical Advice | Payer: Medicare HMO | Attending: Obstetrics and Gynecology | Admitting: Obstetrics and Gynecology

## 2020-08-01 DIAGNOSIS — R42 Dizziness and giddiness: Secondary | ICD-10-CM | POA: Diagnosis not present

## 2020-08-01 DIAGNOSIS — Z5321 Procedure and treatment not carried out due to patient leaving prior to being seen by health care provider: Secondary | ICD-10-CM | POA: Insufficient documentation

## 2020-08-01 NOTE — MAU Provider Note (Signed)
Patient left AMA prior to being seen by a provider due to childcare issues.  Clarisa Fling, NP  11:11 AM 08/01/2020

## 2020-08-01 NOTE — Telephone Encounter (Signed)
Patient is 1 week PP with elevated BP, swelling, headache- advised ED now.  Reason for Disposition . [3] Systolic BP  >= 967 OR Diastolic >= 289 AND [7] cardiac or neurologic symptoms (e.g., chest pain, difficulty breathing, unsteady gait, blurred vision) . [1] Postpartum < 6 weeks AND [9] Systolic BP  >= 150 OR Diastolic >= 90  Answer Assessment - Initial Assessment Questions 1. BLOOD PRESSURE: "What is the blood pressure?" "Did you take at least two measurements 5 minutes apart?"     215/127 2. ONSET: "When did you take your blood pressure?"     since 3. HOW: "How did you obtain the blood pressure?" (e.g., visiting nurse, automatic home BP monitor)     Wrist cuff 4. HISTORY: "Do you have a history of high blood pressure?"     History of high BP with previous pregnancy 5. MEDICATIONS: "Are you taking any medications for blood pressure?" "Have you missed any doses recently?"     No 6. OTHER SYMPTOMS: "Do you have any symptoms?" (e.g., headache, chest pain, blurred vision, difficulty breathing, weakness)     Headache, weakness 7. PREGNANCY: "Is there any chance you are pregnant?" "When was your last menstrual period?"     Post partum- 1 week  Protocols used: BLOOD PRESSURE - HIGH-A-AH

## 2020-08-01 NOTE — MAU Note (Signed)
Pt reports she is unable to find anyone to come to the hospital to help her watch the baby. Pt states she will come back later when she has childcare. AMA form signed by patient and Vernice Jefferson NP made aware of patient status

## 2020-08-01 NOTE — MAU Note (Signed)
PP 07/24/20 vag delivery. Pt stated she started feeling dizzy and achey last 2 days. Took b/p and has been elevated  167/107. C/o headache off and on has headache now. Did not take anything for it. Hands a little swollen today

## 2020-09-04 ENCOUNTER — Encounter: Payer: Self-pay | Admitting: Obstetrics and Gynecology

## 2020-09-04 ENCOUNTER — Other Ambulatory Visit: Payer: Medicare HMO

## 2020-09-04 ENCOUNTER — Ambulatory Visit (INDEPENDENT_AMBULATORY_CARE_PROVIDER_SITE_OTHER): Payer: Medicare HMO | Admitting: Obstetrics and Gynecology

## 2020-09-04 ENCOUNTER — Other Ambulatory Visit: Payer: Self-pay

## 2020-09-04 VITALS — BP 119/85 | HR 62 | Wt 265.0 lb

## 2020-09-04 DIAGNOSIS — Z30430 Encounter for insertion of intrauterine contraceptive device: Secondary | ICD-10-CM | POA: Diagnosis not present

## 2020-09-04 DIAGNOSIS — Z30433 Encounter for removal and reinsertion of intrauterine contraceptive device: Secondary | ICD-10-CM

## 2020-09-04 MED ORDER — LEVONORGESTREL 20 MCG/24HR IU IUD
INTRAUTERINE_SYSTEM | Freq: Once | INTRAUTERINE | Status: AC
  Administered 2020-09-04: 1 via INTRAUTERINE

## 2020-09-04 NOTE — Progress Notes (Addendum)
[facility] Partum Visit Note  [name] is a 35 y.o. [phone] female who presents for a postpartum visit. She is 6 weeks postpartum following a normal spontaneous vaginal delivery.  I have fully reviewed the prenatal and intrapartum course. The delivery was at 35 gestational weeks.  Anesthesia: epidural. Postpartum course has been unremarkable. Baby is doing well. Baby is feeding by bottle - [name]. Bleeding staining only. Bowel function is normal. Bladder function is normal. Patient is not sexually active. Contraception method is IUD. Postpartum depression screening: negative.EPDS= 0   The pregnancy intention screening data noted above was reviewed. Potential methods of contraception were discussed. The patient elected to proceed with IUD or IUS.   IUD inadvertently pulled out while clipping/adjusting the strings.  She should not be charged for the replacement since it was MD fault.     GYNECOLOGY OFFICE PROCEDURE NOTE  [name] is a 35 y.o. [phone] here for Mirena IUD insertion. No GYN concerns.    IUD Insertion Procedure Note Patient identified, informed consent performed, consent signed.   Discussed risks of irregular bleeding, cramping, infection, malpositioning or misplacement of the IUD outside the uterus which may require further procedure such as laparoscopy. Also discussed >99% contraception efficacy, increased risk of ectopic pregnancy with failure of method.  Time out was performed.  Urine pregnancy test negative.  Speculum placed in the vagina.  Cervix visualized.  Cleaned with Betadine x 2.  Grasped anteriorly with a single tooth tenaculum.  Uterus sounded to 9 cm.  Mirena IUD placed per manufacturer's recommendations.  Strings trimmed to 3 cm. Tenaculum was removed, good hemostasis noted.  Patient tolerated procedure well.   Patient was given post-procedure instructions.  She was advised to have backup contraception for one week.  Patient was also asked to  check IUD strings periodically       The following portions of the patient's history were reviewed and updated as appropriate: allergies, current medications, past family history, past medical history, past social history, past surgical history and problem list.  Review of Systems Pertinent items are noted in HPI.    Objective:  There were no vitals taken for this visit.   General:  alert, cooperative and no distress   Breasts:  deferred  Lungs: clear to auscultation bilaterally  Heart:  regular rate and rhythm  Abdomen: soft, non-tender; bowel sounds normal; no masses,  no organomegaly   Vulva:  normal  Vagina: normal vagina  Cervix:  normal appearance  Corpus: not examined  Adnexa:  not evaluated  Rectal Exam: Not performed.        Assessment:    normal postpartum exam. Pap smear not done at today's visit.   Plan:   Essential components of care per ACOG recommendations:  1.  Mood and well being: Patient with negative depression screening today. Reviewed local resources for support.  - Patient does not use tobacco.  - hx of drug use? No   2. Infant care and feeding:  -Patient currently breastmilk feeding? No  -Social determinants of health (SDOH) reviewed in EPIC. No concerns  3. Sexuality, contraception and birth spacing - Patient does not want a pregnancy in the next year.   - Reviewed forms of contraception in tiered fashion. Patient desired IUD today.  Already placed after delivery. - Discussed birth spacing of 18 months  4. Sleep and fatigue -Encouraged family/partner/community support of 4 hrs of uninterrupted sleep to help with mood and fatigue  5. Physical  Recovery  - Discussed patients delivery and complications - Patient had no lacerations. - Patient has urinary incontinence? No  - Patient is safe to resume physical and sexual activity  6.  Health Maintenance - Last pap smear done 02/26/2020 and was normal with negative HPV.  7. Chronic Disease Pt  already has restarted her medications for MS and seen her neurologist.  She sees them roughly q 6 months. - PCP follow up  Mirena IUD placed due to indavertent removal by MD.  No charge to patient.   Lynnda Shields, MD [facility], [place]

## 2020-10-06 IMAGING — US US OB COMP LESS 14 WK
1 series · 15 of 28 positions shown · non-contrast
Comparison: None.

CLINICAL DATA: Vaginal bleeding. Patient is 9 weeks and 4 days
pregnant based on her last menstrual period.

EXAM:
OBSTETRIC <14 WK ULTRASOUND
TECHNIQUE: Transabdominal ultrasound was performed for evaluation of the
gestation as well as the maternal uterus and adnexal regions.

[Series 1: us ob comp less 14 wk · 32 acquisitions, 15 frames shown]
[im 1/32]
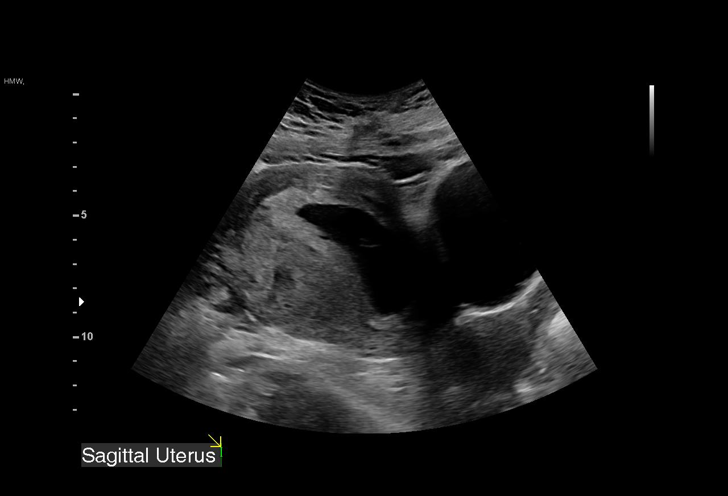
[im 3/32]
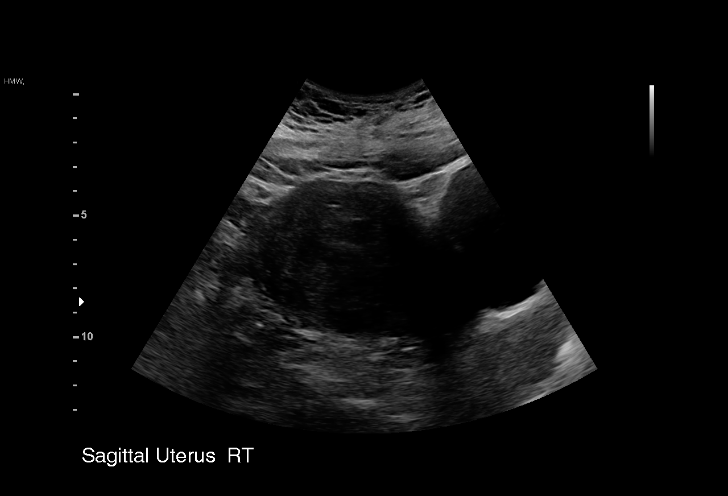
[im 5/32]
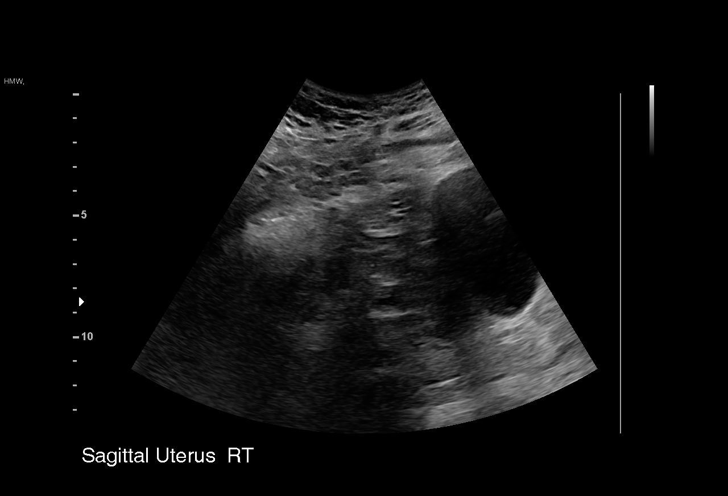
[im 7/32]
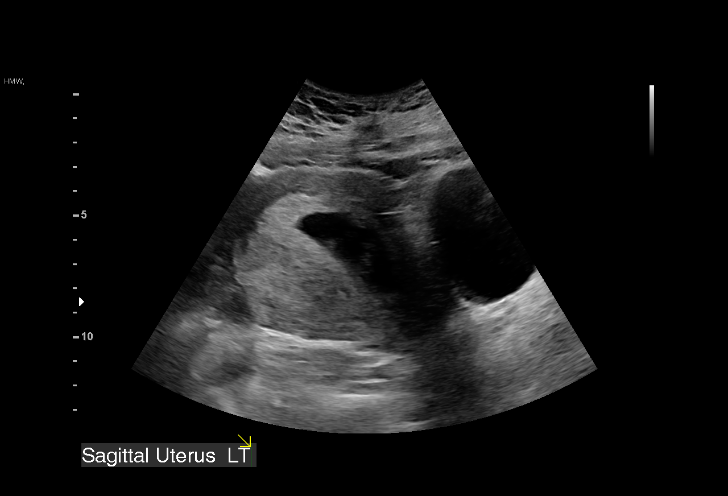
[im 10/32]
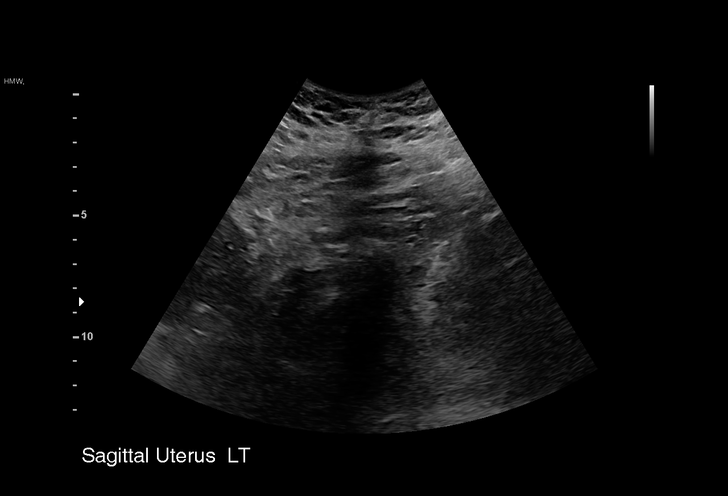
[im 12/32]
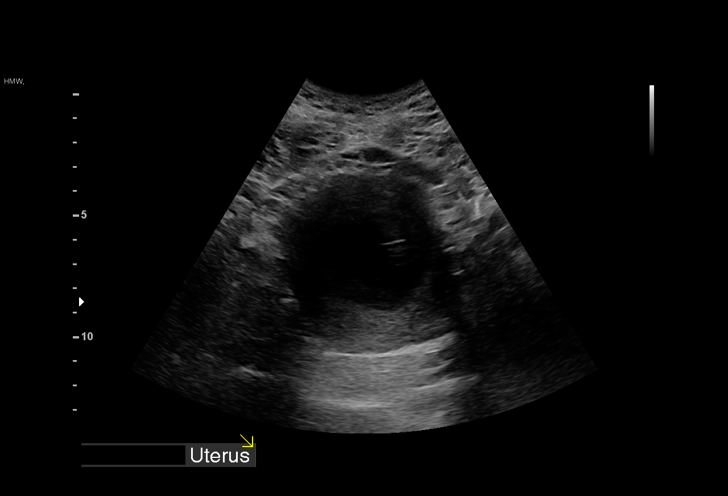
[im 14/32]
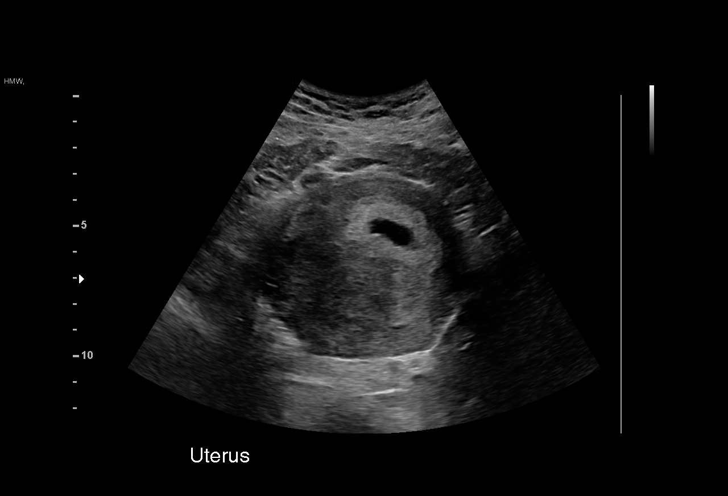
[im 17/32]
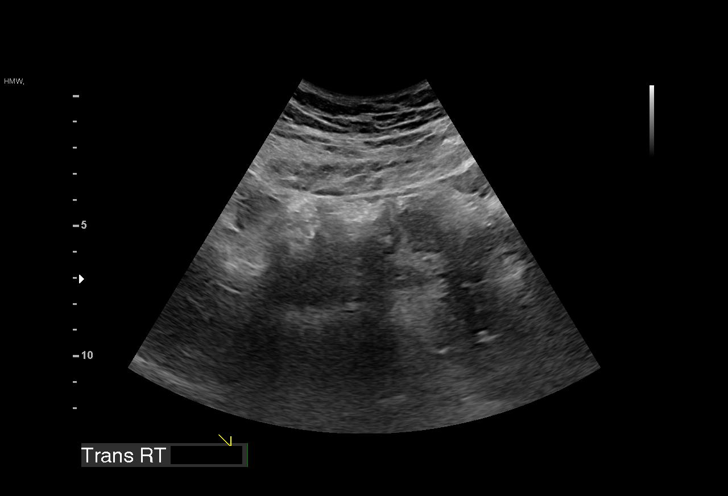
[im 18/32]
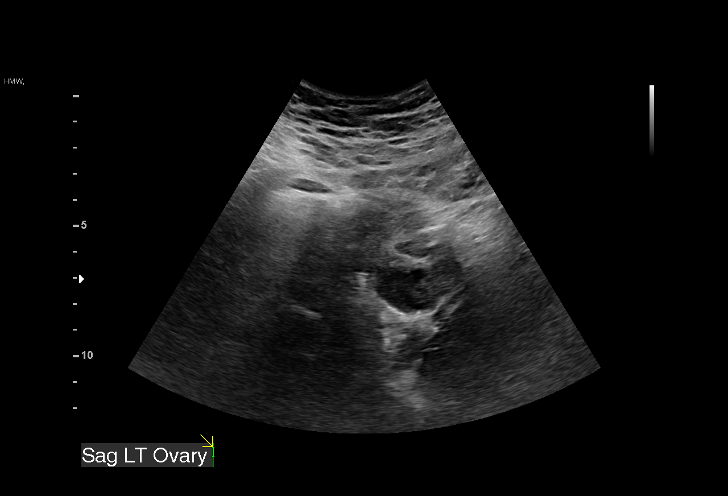
[im 20/32]
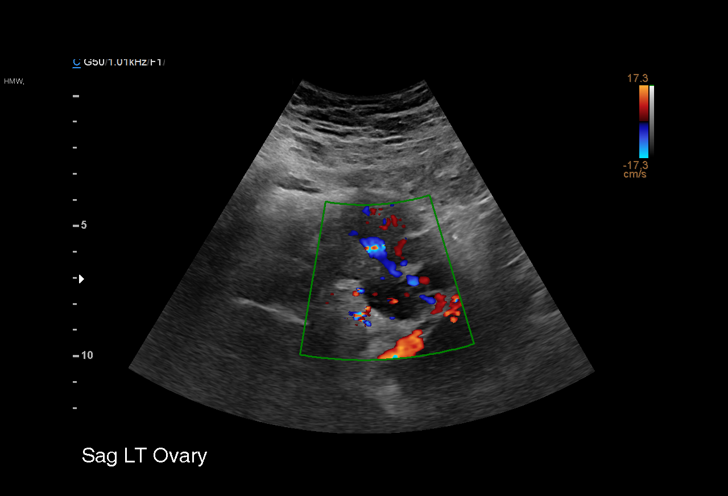
[im 22/32]
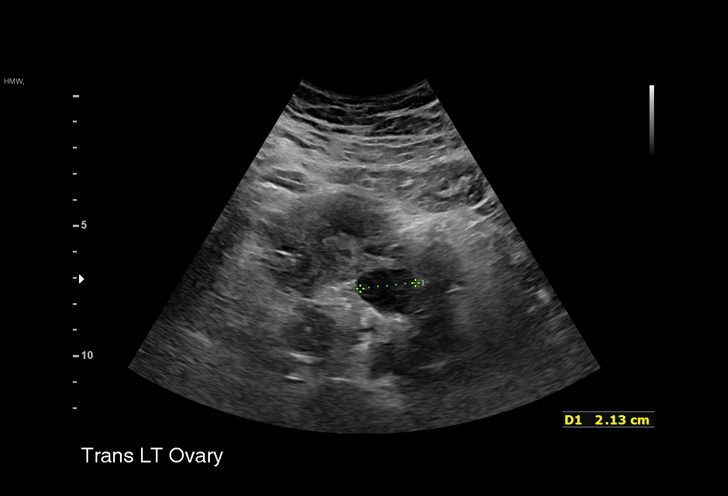
[im 25/32]
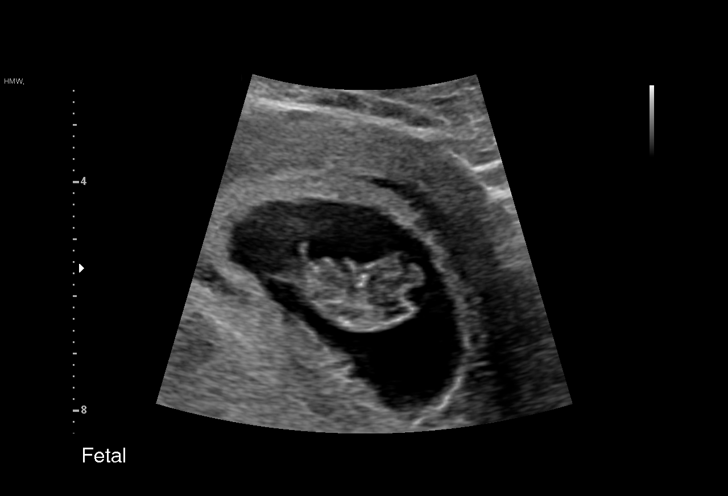
[im 27/32]
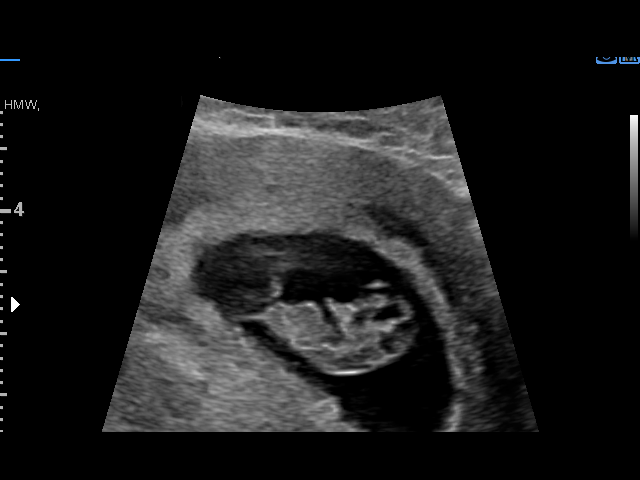
[im 29/32]
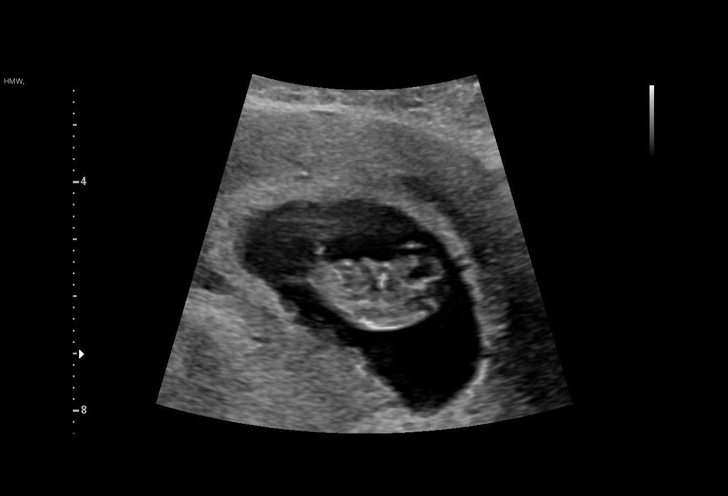
[im 32/32]
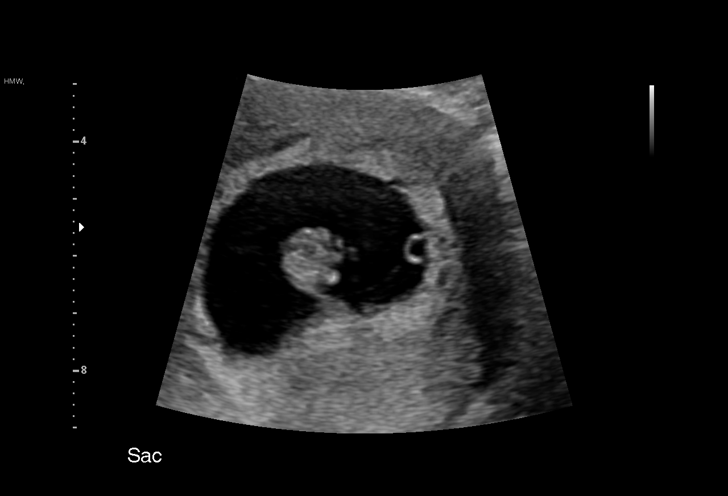

[15 of 28 positions shown; findings below may reference images not displayed]

FINDINGS: Intrauterine gestational sac: Single

Yolk sac:  Visualized.

Embryo:  Visualized.

Cardiac Activity: Visualized.

Heart Rate: 176 bpm

CRL:   2.53 cm   9 w 1 d                  US EDC: 08/26/2020

Subchorionic hemorrhage:  None visualized.

Maternal uterus/adnexae: No uterine masses. No adnexal masses. Right
ovary not visualized. Normal left ovary. No pelvic free fluid.
IMPRESSION: 1. Single live intrauterine pregnancy with a measured gestational
age of 9 weeks and day, consistent with the expected gestational age
based on the last menstrual period.
2. No pregnancy complication.

## 2021-04-02 IMAGING — US US MFM FETAL BPP W/O NON-STRESS
1 series · 13 of 28 positions shown · non-contrast
Comparison: none

[Series 1: us mfm fetal bpp w/o non-stress · 38 acquisitions, 13 frames shown]
[im 2/38]
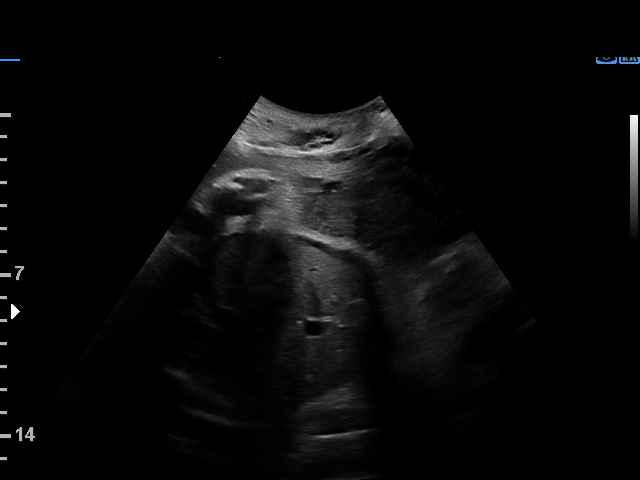
[im 5/38]
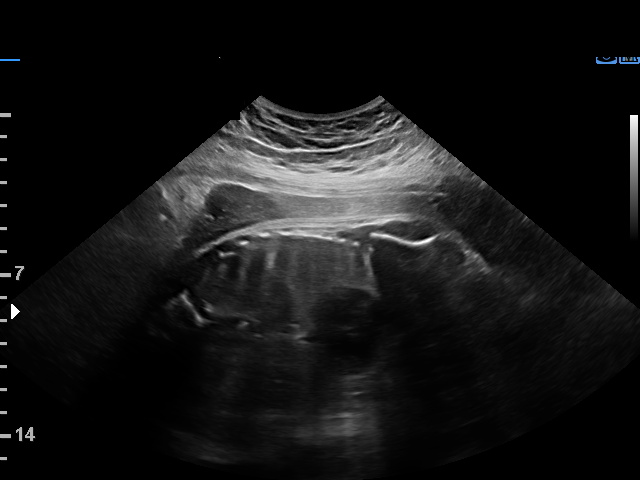
[im 7/38]
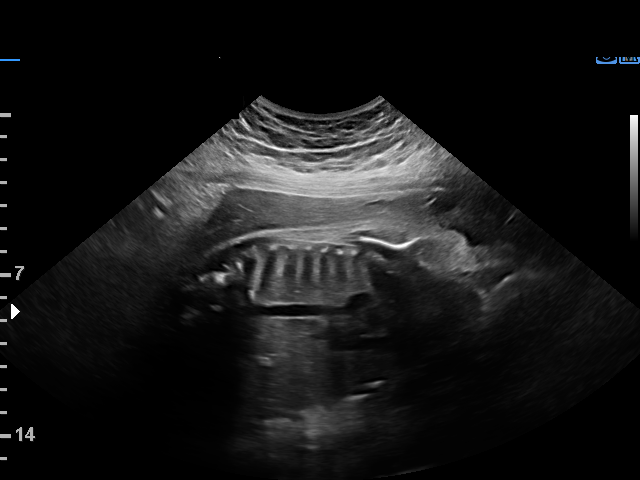
[im 10/38]
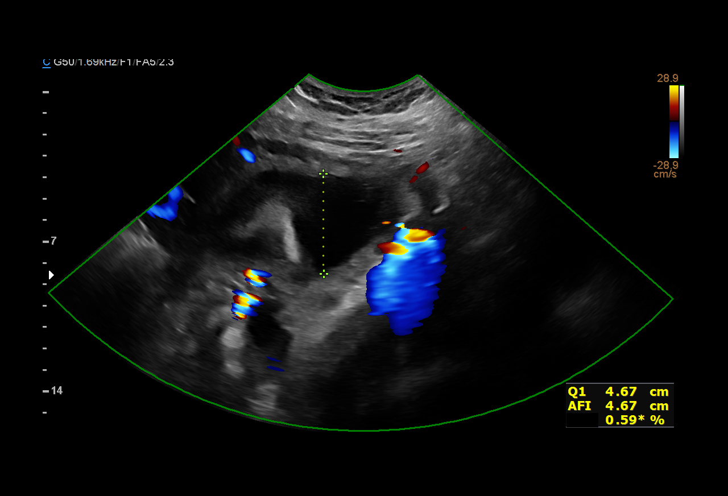
[im 13/38]
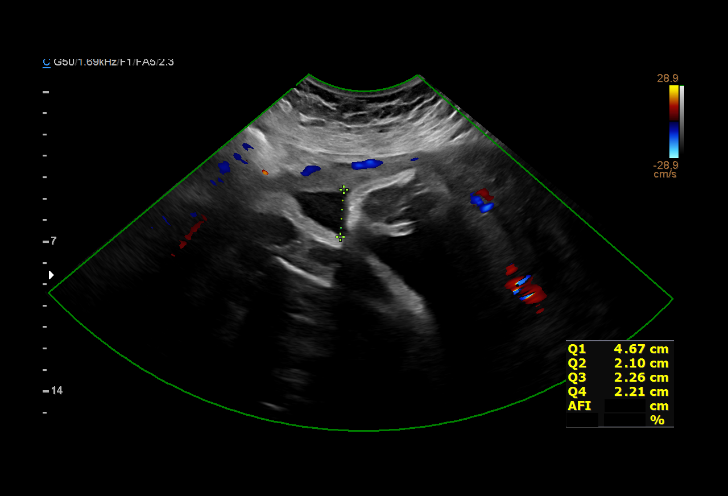
[im 16/38]
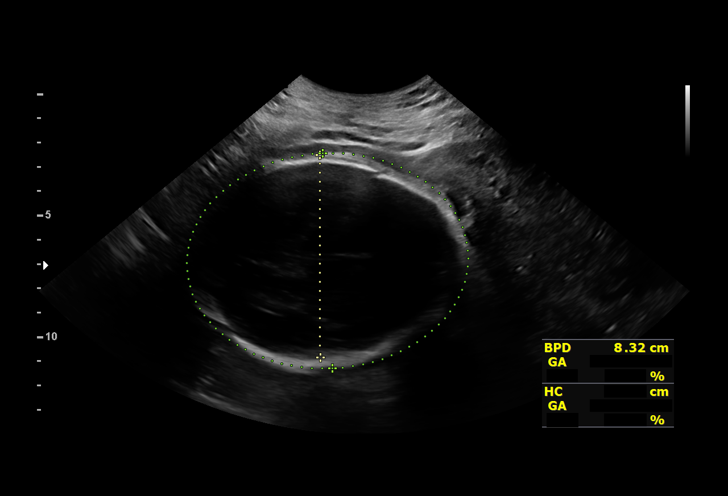
[im 20/38]
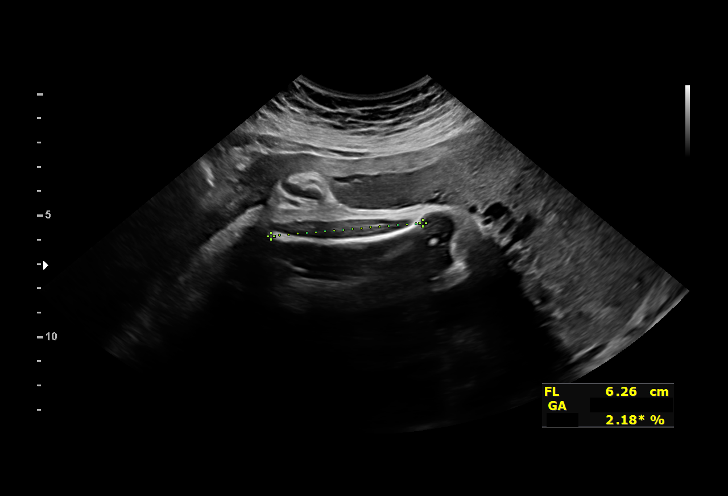
[im 22/38]
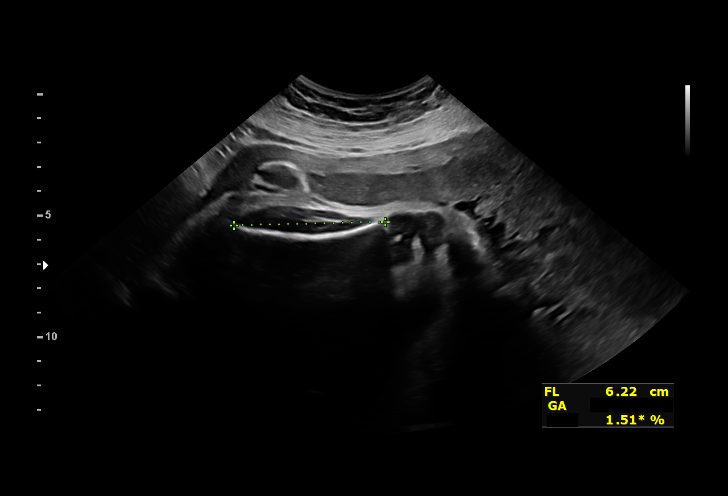
[im 25/38]
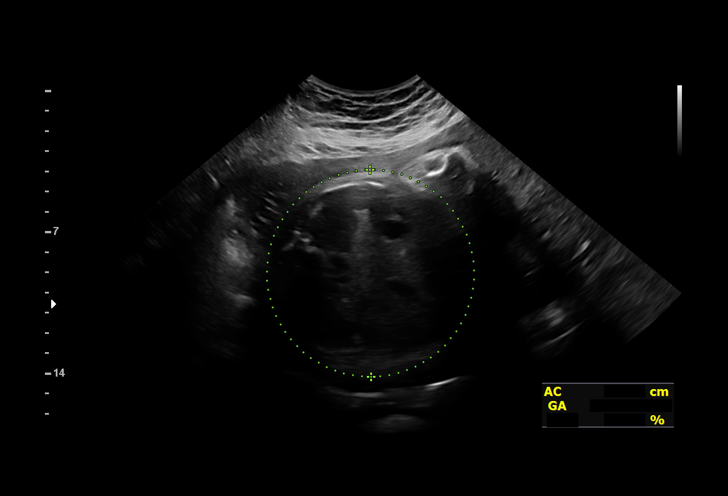
[im 28/38]
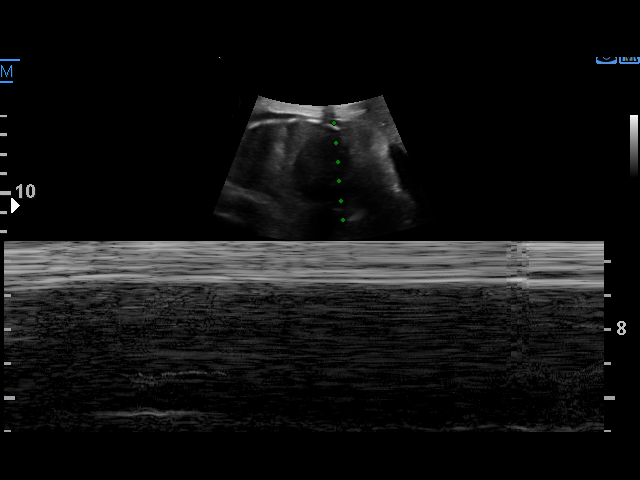
[im 31/38]
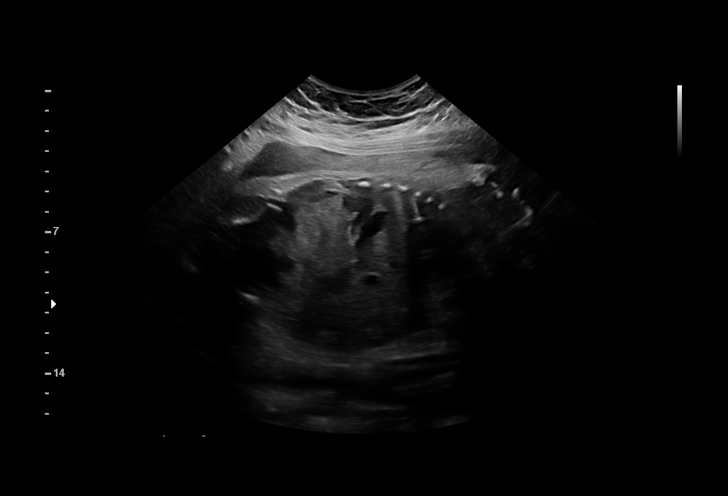
[im 33/38]
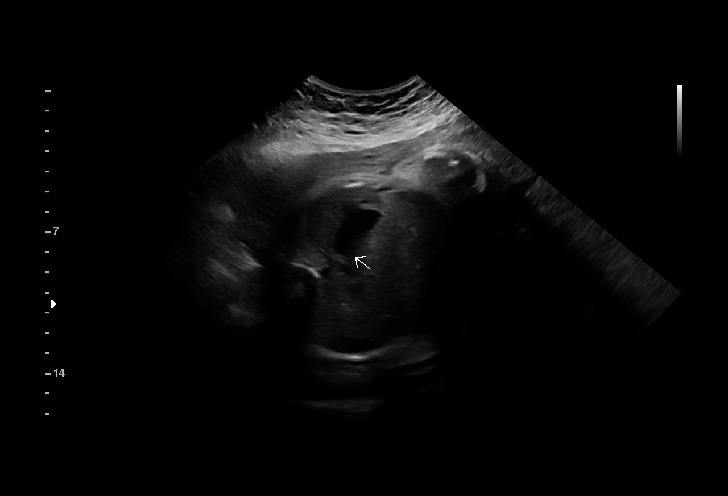
[im 36/38]
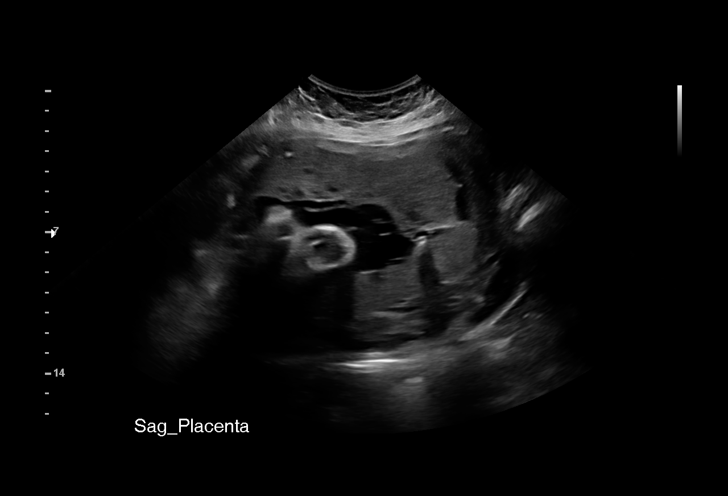

[13 of 28 positions shown; findings below may reference images not displayed]

Indications

 Obesity complicating pregnancy, second
 trimester (BMI 42)
 Advanced maternal age multigravida 35+,
 second trimester (low risk NIPS, neg
 AFP/Horizon
 Poor obstetric history: Previous preterm
 delivery, antepartum (32 and 35 weeks)
 Multiple sclerosis                             9EE.3QS G35
 35 weeks gestation of pregnancy
Fetal Evaluation

 Num Of Fetuses:         1
 Fetal Heart Rate(bpm):  135
 Cardiac Activity:       Observed
 Presentation:           Cephalic
 Placenta:               Left lateral
 P. Cord Insertion:      Previously Visualized

 Amniotic Fluid
 AFI FV:      Within normal limits

 AFI Sum(cm)     %Tile       Largest Pocket(cm)
 11.24           30

 RUQ(cm)       RLQ(cm)       LUQ(cm)        LLQ(cm)

Biophysical Evaluation

 Amniotic F.V:   Pocket => 2 cm             F. Tone:        Observed
 F. Movement:    Observed                   Score:          [DATE]
 F. Breathing:   Observed
Biometry

 BPD:      85.8  mm     G. Age:  34w 4d         40  %    CI:        73.09   %    70 - 86
                                                         FL/HC:      19.9   %    20.1 -
 HC:       319   mm     G. Age:  35w 6d         38  %    HC/AC:      0.99        0.93 -
 AC:      322.3  mm     G. Age:  36w 1d         85  %    FL/BPD:     73.9   %    71 - 87
 FL:       63.4  mm     G. Age:  32w 5d        3.8  %    FL/AC:      19.7   %    20 - 24
 HUM:      60.3  mm     G. Age:  35w 0d         65  %

 Est. FW:    7932  gm    5 lb 11 oz      49  %
OB History

 Gravidity:    7         Term:   3        Prem:   0        SAB:   0
 TOP:          3       Ectopic:  0        Living: 4
Gestational Age

 LMP:           35w 0d        Date:  11/17/19                 EDD:   08/23/20
 U/S Today:     34w 6d                                        EDD:   08/24/20
 Best:          35w 0d     Det. By:  LMP  (11/17/19)          EDD:   08/23/20
Anatomy

 Cranium:               Previously seen        Aortic Arch:            Previously seen
 Cavum:                 Previously seen        Ductal Arch:            Previously seen
 Ventricles:            Previously seen        Diaphragm:              Appears normal
 Choroid Plexus:        Previously seen        Stomach:                Appears normal, left
                                                                       sided
 Cerebellum:            Previously seen        Abdomen:                Multiple
                                                                       Echogenic Foci
 Posterior Fossa:       Previously seen        Abdominal Wall:         Previously seen
 Nuchal Fold:           Previously seen        Cord Vessels:           Previously seen
 Face:                  Orbits prev seen;      Kidneys:                Previously seen
                        profile nws
 Lips:                  Previously seen        Bladder:                Previously seen
 Thoracic:              Appears normal         Spine:                  Appears normal
 Heart:                 Appears normal         Upper Extremities:      Previously seen
 RVOT:                  Previously seen        Lower Extremities:      Previously seen
 LVOT:                  Previously seen

 Other:  Feet and left hand/5th digit prev visualized. Nasal bone prev
         visualized.
Comments

 This patient has been hospitalized due to a fall secondary to
 an exacerbation of multiple sclerosis.  Her pregnancy has
 also been complicated by cholestasis of pregnancy.  The plan
 is for delivery next week once she completes her Solu-Medrol
 course and receives a complete course of antenatal
 corticosteroids.  The patient has declined an inpatient
 neurology consultation.
 The fetal growth and amniotic fluid level appears appropriate
 for her gestational age (EFW 5 pounds 11 ounces, 49th
 percentile).
 Abdominal calcifications continue to be noted in the fetal
 abdomen.  The significance of this finding remains
 undetermined.  Her baby should be examined after delivery to
 determine if any treatment is necessary.
 A biophysical profile performed today was [DATE].
 We will continue the plan for inpatient management until
 delivery next week.

## 2022-08-12 ENCOUNTER — Ambulatory Visit (INDEPENDENT_AMBULATORY_CARE_PROVIDER_SITE_OTHER): Payer: Medicare HMO | Admitting: Obstetrics and Gynecology

## 2022-08-12 ENCOUNTER — Encounter: Payer: Self-pay | Admitting: Obstetrics and Gynecology

## 2022-08-12 VITALS — BP 126/89 | HR 72 | Ht 64.0 in | Wt 251.0 lb

## 2022-08-12 DIAGNOSIS — Z30432 Encounter for removal of intrauterine contraceptive device: Secondary | ICD-10-CM | POA: Diagnosis not present

## 2022-08-12 DIAGNOSIS — Z30011 Encounter for initial prescription of contraceptive pills: Secondary | ICD-10-CM | POA: Diagnosis not present

## 2022-08-12 DIAGNOSIS — N921 Excessive and frequent menstruation with irregular cycle: Secondary | ICD-10-CM | POA: Diagnosis not present

## 2022-08-12 MED ORDER — DROSPIRENONE-ETHINYL ESTRADIOL 3-0.02 MG PO TABS: 1.0000 | ORAL_TABLET | Freq: Every day | ORAL | 2 refills | Status: DC

## 2022-08-12 NOTE — Progress Notes (Signed)
   IUD Removal Procedure Note  Patient is 37 y.o. [id number] who is here for Mirena IUD removal. She would like IUD removed secondary to AUB. She has had heavy vaginal bleeding issues with IUD since its insertion. She understands that she could get pregnant after removal of IUD if she does not use another form of contraception. She has no other complaints today. Reviewed risks of removal including pain, bleeding, difficult removal and inability to remove IUD which may require surgical removal in OR. She affirms that she would like IUD removed and start OCP.  BP 126/89   Pulse 72   Ht 5' 4"$  (1.626 m)   Wt 251 lb (113.9 kg)   BMI 43.08 kg/m   Patient with normal appearing external female genitalia. Graves speculum placed in vagina and Mirena IUD strings easily visualized. Strings grasped with ring forceps and removed easily. Minimal bleeding noted. All instruments removed from vagina. Patient tolerated procedure very well.    She was given post removal instructions. She is planning on using OCPs for contraception.   Return 02/2023 for annual or prn.    Laury Deep, MSN, CNM 08/12/2022 11:35 AM

## 2022-08-12 NOTE — Progress Notes (Signed)
Pt presents for IUD check. IUD placed 08/2020. Pt c/o of heavy, prolonged periods with IUD. Requesting IUD removal. Considering BC pills. Pt c/o breast pain.

## 2022-08-12 NOTE — Progress Notes (Signed)
  GYNECOLOGY PROGRESS NOTE  History:  [name] is a 37 y.o. [id number] presents to [facility] office today for problem gyn visit. She reports heavy vaginal bleeding and prolonged vaginal bleeding each month.  She denies h/a, dizziness, shortness of breath, n/v, or fever/chills.    The following portions of the patient's history were reviewed and updated as appropriate: allergies, current medications, past family history, past medical history, past social history, past surgical history and problem list. Last pap smear on 02/2021 was normal, HRHPV.  Review of Systems:  Pertinent items are noted in HPI.   Objective:  Physical Exam Vitals:   08/12/22 0857 08/12/22 0947  BP: (!) 136/92 126/89  Pulse: 76 72  Height: 5' 4"$  (1.626 m)   Weight: 251 lb (113.9 kg)   BMI (Calculated): 43.06     VS reviewed, nursing note reviewed,  Constitutional: well developed, well nourished, no distress HEENT: normocephalic CV: normal rate Pulm/chest wall: normal effort Breast Exam: deferred Abdomen: soft Neuro: alert and oriented x 3 Skin: warm, dry Psych: affect normal Pelvic exam: Cervix pink, visually closed, without lesion, scant white creamy discharge, vaginal walls and external genitalia normal. IUD strings visualized out of cervical os. **See separate procedure note Bimanual exam: deferred Assessment & Plan:  1. Encounter for removal of intrauterine contraceptive device (IUD) - Removed without difficulty  2. Encounter for initial prescription of contraceptive pills - Rx: drospirenone-ethinyl estradiol (LORYNA) 3-0.02 MG tablet; Take 1 tablet by mouth daily.  Dispense: 28 tablet; Refill: 2 - Advised to trial OCP for 3 months  - F/U virtual appt with me   Total face-to-face time spent during this encounter was 10 minutes. There was 5 minutes of chart review time spent prior to this encounter. Total time spent = 15 minutes.   Laury Deep, CNM 9:38 AM

## 2022-09-18 ENCOUNTER — Encounter (HOSPITAL_BASED_OUTPATIENT_CLINIC_OR_DEPARTMENT_OTHER): Payer: Self-pay

## 2022-09-18 ENCOUNTER — Emergency Department (HOSPITAL_BASED_OUTPATIENT_CLINIC_OR_DEPARTMENT_OTHER): Payer: Medicare PPO

## 2022-09-18 ENCOUNTER — Emergency Department (HOSPITAL_BASED_OUTPATIENT_CLINIC_OR_DEPARTMENT_OTHER)
Admission: EM | Admit: 2022-09-18 | Discharge: 2022-09-18 | Disposition: A | Discharge status: Home / Self Care | Payer: Medicare PPO | Attending: Emergency Medicine | Admitting: Emergency Medicine

## 2022-09-18 DIAGNOSIS — R519 Headache, unspecified: Secondary | ICD-10-CM

## 2022-09-18 LAB — CBC WITH DIFFERENTIAL/PLATELET
Abs Immature Granulocytes: 0.01 10*3/uL (ref 0.00–0.07)
Basophils Absolute: 0.1 10*3/uL (ref 0.0–0.1)
Basophils Relative: 1 %
Eosinophils Absolute: 0 10*3/uL (ref 0.0–0.5)
Eosinophils Relative: 1 %
HCT: 37.2 % (ref 36.0–46.0)
Hemoglobin: 12 g/dL (ref 12.0–15.0)
Immature Granulocytes: 0 %
Lymphocytes Relative: 36 %
Lymphs Abs: 1.8 10*3/uL (ref 0.7–4.0)
MCH: 31 pg (ref 26.0–34.0)
MCHC: 32.3 g/dL (ref 30.0–36.0)
MCV: 96.1 fL (ref 80.0–100.0)
Monocytes Absolute: 0.4 10*3/uL (ref 0.1–1.0)
Monocytes Relative: 8 %
Neutro Abs: 2.8 10*3/uL (ref 1.7–7.7)
Neutrophils Relative %: 54 %
Platelets: 474 10*3/uL — ABNORMAL HIGH (ref 150–400)
RBC: 3.87 MIL/uL (ref 3.87–5.11)
RDW: 11.7 % (ref 11.5–15.5)
WBC: 5.1 10*3/uL (ref 4.0–10.5)
nRBC: 0 % (ref 0.0–0.2)

## 2022-09-18 LAB — BASIC METABOLIC PANEL
Anion gap: 7 (ref 5–15)
BUN: 8 mg/dL (ref 6–20)
CO2: 28 mmol/L (ref 22–32)
Calcium: 9.6 mg/dL (ref 8.9–10.3)
Chloride: 103 mmol/L (ref 98–111)
Creatinine, Ser: 0.89 mg/dL (ref 0.44–1.00)
GFR, Estimated: >60 mL/min (ref >60)
Glucose, Bld: 92 mg/dL (ref 70–99)
Potassium: 3.9 mmol/L (ref 3.5–5.1)
Sodium: 138 mmol/L (ref 135–145)

## 2022-09-18 LAB — HCG, SERUM, QUALITATIVE: Preg, Serum: NEGATIVE

## 2022-09-18 LAB — SEDIMENTATION RATE: Sed Rate: 18 mm/hr (ref 0–22)

## 2022-09-18 MED ORDER — DIPHENHYDRAMINE HCL 50 MG/ML IJ SOLN
12.5000 mg | Freq: Once | INTRAMUSCULAR | Status: AC
Start: 2022-09-18 — End: 2022-09-18
  Administered 2022-09-18: 12.5 mg via INTRAVENOUS
  Filled 2022-09-18: qty 1

## 2022-09-18 MED ORDER — LIDOCAINE HCL (PF) 1 % IJ SOLN
INTRAMUSCULAR | Status: AC
  Filled 2022-09-18: qty 5

## 2022-09-18 MED ORDER — KETOROLAC TROMETHAMINE 30 MG/ML IJ SOLN
15.0000 mg | Freq: Once | INTRAMUSCULAR | Status: AC
  Administered 2022-09-18: 15 mg via INTRAVENOUS
  Filled 2022-09-18: qty 1

## 2022-09-18 MED ORDER — LIDOCAINE-EPINEPHRINE (PF) 2 %-1:200000 IJ SOLN
20.0000 mL | Freq: Once | INTRAMUSCULAR | Status: DC
  Filled 2022-09-18: qty 20

## 2022-09-18 MED ORDER — IOHEXOL 350 MG/ML SOLN
100.0000 mL | Freq: Once | INTRAVENOUS | Status: AC | PRN
  Administered 2022-09-18: 75 mL via INTRAVENOUS

## 2022-09-18 MED ORDER — METOCLOPRAMIDE HCL 5 MG/ML IJ SOLN
5.0000 mg | Freq: Once | INTRAMUSCULAR | Status: AC
Start: 2022-09-18 — End: 2022-09-18
  Administered 2022-09-18: 5 mg via INTRAVENOUS
  Filled 2022-09-18: qty 2

## 2022-09-18 MED ORDER — SODIUM CHLORIDE 0.9 % IV SOLN: INTRAVENOUS | Status: DC | PRN

## 2022-09-18 NOTE — ED Provider Notes (Signed)
[name] EMERGENCY DEPARTMENT AT [facility] Provider Note   CSN: [id number] Arrival date & time: 09/18/22  1107     History  Chief Complaint  Patient presents with   Headache    [name] is a 37 y.o. female who presents to the emergency department with complaint of headache.pmh ms.  She has had a generalized constant headache with associated blurry vision and photosensitivity for 2 weeks.  Patient states she has been taking over-the-counter headache medications including Tylenol and Advil and no matter what she does the headache is always there.  Patient states that she was taking birth control last month but stopped because the packaging said it could cause headache and she did not want this to be the cause.  She has a history of migraine headaches but states that this was when she was a teenager and has not had anything since.  Patient also noticed a painful mobile lump in her left breast today and thought that she saw some nipple discharge a couple of days ago.  She is not actively breast-feeding.  She denies fever, chills, neck stiffness, rash, nausea, vomiting, head injury.   Headache      Home Medications Prior to Admission medications   Medication Sig Start Date End Date Taking? Authorizing Provider  acetaminophen (TYLENOL) 325 MG tablet Take 650 mg by mouth every 6 (six) hours as needed.    [provider]  aspirin EC 81 MG tablet Take 1 tablet (81 mg total) by mouth daily. Take after 12 weeks for prevention of preeclampsia later in pregnancy Patient not taking: Reported on 09/04/2020 06/03/20   Constant, Peggy, MD  Blood Pressure Monitoring (BLOOD PRESSURE KIT) DEVI 1 kit by Does not apply route once a week. Check Blood Pressure regularly and record readings into the [employer] App.  Large Cuff.  DX O90.0 Patient not taking: Reported on 08/12/2022 02/20/20   Johnny Bridge, MD  drospirenone-ethinyl estradiol (LORYNA) 3-0.02 MG tablet Take 1 tablet by  mouth daily. 08/12/22   Raelyn Mora, CNM  Elastic Bandages & Supports (COMFORT FIT MATERNITY SUPP MED) MISC 1 Device by Does not apply route daily. Patient not taking: Reported on 09/04/2020 04/08/20   Hurshel Party, CNM  famotidine (PEPCID) 20 MG tablet Take 1 tablet (20 mg total) by mouth 2 (two) times daily. Patient not taking: Reported on 08/12/2022 07/03/20   Warden Fillers, MD  gabapentin (NEURONTIN) 400 MG capsule Take 300 mg by mouth 3 (three) times daily.    [provider]  hydrOXYzine (ATARAX/VISTARIL) 25 MG tablet Take 1 tablet (25 mg total) by mouth every 6 (six) hours as needed for itching. Patient not taking: Reported on 09/04/2020 07/12/20   Tereso Newcomer, MD  ibuprofen (ADVIL) 600 MG tablet Take 1 tablet (600 mg total) by mouth every 6 (six) hours. Patient not taking: Reported on 08/12/2022 07/26/20   Aviva Signs, CNM  oxyCODONE-acetaminophen (PERCOCET/ROXICET) 5-325 MG tablet Take by mouth every 4 (four) hours as needed for severe pain.    [provider]  Prenatal Vit-Fe Fumarate-FA (PRENATAL MULTIVITAMIN) TABS tablet Take 1 tablet by mouth daily at 12 noon. Patient not taking: Reported on 09/04/2020    [provider]      Allergies    Patient has no known allergies.    Review of Systems   Review of Systems  Neurological:  Positive for headaches.    Physical Exam Updated Vital Signs BP (!) 148/94   Pulse  66   Temp 97.9 F (36.6 C)   Resp 18   Ht 5\' 4"  (1.626 m)   Wt 115.7 kg   SpO2 100%   BMI 43.77 kg/m  Physical Exam Vitals and nursing note reviewed.  Constitutional:      General: She is not in acute distress.    Appearance: She is well-developed. She is not diaphoretic.  HENT:     Head: Normocephalic and atraumatic.     Right Ear: External ear normal.     Left Ear: External ear normal.     Mouth/Throat:     Pharynx: No oropharyngeal exudate.  Eyes:     General: No scleral icterus.    Extraocular  Movements: Extraocular movements intact.     Conjunctiva/sclera: Conjunctivae normal.     Funduscopic exam:    Right eye: Papilledema (questionable) present. No hemorrhage, exudate or AV nicking. Red reflex absent.        Left eye: Papilledema (questionable) present. No hemorrhage, exudate or AV nicking. Red reflex absent.  Neck:     Thyroid: No thyromegaly.     Vascular: No JVD.     Comments: No meningismus Cardiovascular:     Rate and Rhythm: Normal rate and regular rhythm.     Heart sounds: Normal heart sounds. No murmur heard.    No friction rub. No gallop.  Pulmonary:     Effort: Pulmonary effort is normal. No respiratory distress.     Breath sounds: Normal breath sounds.  Abdominal:     General: Bowel sounds are normal. There is no distension.     Palpations: Abdomen is soft. There is no mass.     Tenderness: There is no abdominal tenderness. There is no guarding.  Musculoskeletal:        General: No tenderness. Normal range of motion.     Cervical back: Normal range of motion and neck supple.     Comments: No meningismus  Skin:    General: Skin is warm and dry.     Findings: No rash.  Neurological:     Mental Status: She is alert and oriented to person, place, and time.     Cranial Nerves: No cranial nerve deficit.     Coordination: Coordination normal.     Deep Tendon Reflexes: Reflexes are normal and symmetric.     Comments: Speech is clear and goal oriented, follows commands Major Cranial nerves without deficit, no facial droop Normal strength in upper and lower extremities bilaterally including dorsiflexion and plantar flexion, strong and equal grip strength Sensation normal to light and sharp touch Moves extremities without ataxia, coordination intact Normal finger to nose and rapid alternating movements Neg romberg, no pronator drift Normal gait Normal heel-shin and balance   Psychiatric:        Behavior: Behavior normal.     ED Results / Procedures /  Treatments   Labs (all labs ordered are listed, but only abnormal results are displayed) Labs Reviewed  CBC WITH DIFFERENTIAL/PLATELET - Abnormal; Notable for the following components:      Result Value   Platelets 474 (*)    All other components within normal limits  SEDIMENTATION RATE  BASIC METABOLIC PANEL  HCG, SERUM, QUALITATIVE    EKG None  Radiology CT VENOGRAM HEAD  Result Date: 09/18/2022 CLINICAL DATA:  Provided history: Headache, intracranial hypertension features. Vision loss, binocular. Additional history obtained from prior radiology records: History of multiple sclerosis. EXAM: CT VENOGRAM HEAD TECHNIQUE: Venographic phase images of the brain  were obtained following the administration of intravenous contrast. Multiplanar reformats and maximum intensity projections were generated. RADIATION DOSE REDUCTION: This exam was performed according to the departmental dose-optimization program which includes automated exposure control, adjustment of the mA and/or kV according to patient size and/or use of iterative reconstruction technique. CONTRAST:  75mL OMNIPAQUE IOHEXOL 350 MG/ML SOLN COMPARISON:  Brain MRI 12/01/2014. FINDINGS: NON-CONTRAST HEAD CT FINDINGS: Brain: Cerebral volume is normal. Partially empty sella turcica. Known cerebral white matter disease was better appreciated on the prior brain MRI of 12/01/2014 (in this patient with a reported history of multiple sclerosis). There is no acute intracranial hemorrhage. No demarcated cortical infarct. No extra-axial fluid collection. No evidence of an intracranial mass. No midline shift. Vascular: No hyperdense vessel. Skull: No fracture or aggressive osseous lesion. Sinuses/Orbits: No mass or acute finding within the imaged orbits. Mild mucosal thickening within the left maxillary sinus. CT VENOGRAM HEAD FINDINGS: The superior sagittal sinus, internal cerebral veins, vein of Galen, straight sinus, transverse sinuses, sigmoid sinuses  and visualized jugular veins are patent. There is no appreciable intracranial venous thrombosis. Evaluation for stenoses within the distal transverse dural venous sinuses is limited due to small vessel size. IMPRESSION: CT head: 1. No evidence of acute intracranial hemorrhage, acute infarct or intracranial mass. 2. Partially empty sella turcica. This finding can reflect incidental anatomic variation, or alternatively, it can be associated with idiopathic intracranial hypertension (pseudotumor cerebri). 3. Known cerebral white matter disease was better appreciated on the prior brain MRI of 12/01/2014 (in this patient with a reported history of multiple sclerosis). 4. Mild mucosal thickening within the left maxillary sinus CT venogram head: 1. No evidence of intracranial venous thrombosis. 2. Evaluation for stenoses within the distal transverse dural venous sinuses is limited due to small vessel size. Electronically Signed   By: Jackey Loge D.O.   On: 09/18/2022 14:32    Procedures Procedures    Medications Ordered in ED Medications  0.9 %  sodium chloride infusion ( Intravenous New Bag/Given 09/18/22 1225)  lidocaine-EPINEPHrine (XYLOCAINE W/EPI) 2 %-1:200000 (PF) injection 20 mL (has no administration in time range)  metoCLOPramide (REGLAN) injection 5 mg (5 mg Intravenous Given 09/18/22 1224)  diphenhydrAMINE (BENADRYL) injection 12.5 mg (12.5 mg Intravenous Given 09/18/22 1224)  ketorolac (TORADOL) 30 MG/ML injection 15 mg (15 mg Intravenous Given 09/18/22 1225)  iohexol (OMNIPAQUE) 350 MG/ML injection 100 mL (75 mLs Intravenous Contrast Given 09/18/22 1355)    ED Course/ Medical Decision Making/ A&P Clinical Course as of 09/18/22 1553  Fri Sep 18, 2022  3236 37 year old female here with headache.  This has been unrelenting, not worse in the morning or at night or with position with does not get better with treatment with associated blurry vision.  Highest on my differential for this 37 year old obese  female would be idiopathic intracranial hypertension.  Have also considered potential for space containing lesion specifically dural venous thrombus given her recent oral contraceptive usage. Other considerations include mass, tension headache, migraine headache intractable.  I have extremely low suspicion for infectious cause such as meningitis or encephalitis.  Current plan is for treatment of pain, CT venogram.  If these are negative suspect patient will need LP and opening pressure for assessment. [AH]  1443 CT VENOGRAM HEAD [AH]  1444 I ordered and independently interpreted CT venogram, and empty sella turcica concerning for IIH.  MS lesions not well assessed on this image . [AH]    Clinical Course User Index [AH] Arthor Captain, PA-C  Medical Decision Making Amount and/or Complexity of Data Reviewed Labs: ordered. Radiology: ordered.  Risk Prescription drug management.   Mendel Corningobin L Greenough presents with headache Given the large differential diagnosis for Mendel CorningRobin L Scroggins, the decision making in this case is of high complexity.  After evaluating all of the data points in this case, the presentation of Mendel CorningRobin L Kosak is NOT consistent with skull fracture, meningitis/encephalitis, SAH/sentinel bleed, Intracranial Hemorrhage (ICH) (subdural/epidural), acute obstructive hydrocephalus, space occupying lesions, CVA, CO Poisoning, Basilar/vertebral artery dissection, preeclampsia, cerebral venous thrombosis, hypertensive emergency, temporal Arteritis.   I do have ever still have high suspicion for idiopathic intracranial hypertension.  Patient was unable to stay for LP however does have an established relationship with neurology due to history of relapsing and remitting MS.  Her symptoms today are not consistent with optic neuritis or acute MS flare.  She understands that she will need to contact her neurologist for close follow-up potential LP and further assessment.   Patient has to pick her children up immediately and wishes to be discharged at this time.   Strict return and follow-up precautions have been given by me personally or by detailed written instructions verbalized by nursing staff using the teach back method to patient/family/caregiver.  Data Reviewed/Counseling: I have reviewed the patient's vital signs, nursing notes, and other relevant tests/information. I had a detailed discussion regarding the historical points, exam findings, and any diagnostic results supporting the discharge diagnosis. I also discussed the need for outpatient follow-up and the need to return to the ED if symptoms worsen or if there are any questions or concerns that arise at hom         Final Clinical Impression(s) / ED Diagnoses Final diagnoses:  Intractable headache, unspecified chronicity pattern, unspecified headache type    Rx / DC Orders ED Discharge Orders     None         Arthor CaptainHarris, Jayvan Mcshan, PA-C 09/18/22 1555    Derwood KaplanNanavati, Ankit, MD 09/19/22 [id number]

## 2022-09-18 NOTE — ED Triage Notes (Signed)
States woke up with headache.  States sensitive to light.  Seen by MD yesterday for high BP.  Also states has lump on left breast.

## 2022-09-18 NOTE — Discharge Instructions (Signed)
1) please send a message to your neurologist stating that you were seen at [facility] emergency department for a headache that will not go away for the past 2 weeks with blurry vision.  Please let them know that we have high concern for diagnosis of IIH (idiopathic intracranial hypertension).  2) please let your neurologist know that you were unable to get a lumbar puncture today in the emergency department due to time constraints but that the ER provider was planning on doing this.  You will likely need lumbar puncture at some point and your neurologist may be able to set this up in the outpatient setting.   Contact a health care provider if: You have changes in your vision, such as: Double vision. Blurred vision. Poor peripheral vision. Get help right away if: You have any of the following symptoms and they get worse or do not get better: Headaches. Nausea. Vomiting. Sudden trouble seeing.

## 2022-09-21 ENCOUNTER — Other Ambulatory Visit: Payer: Self-pay | Admitting: Internal Medicine

## 2022-09-22 LAB — LIPID PANEL
Cholesterol: 277 mg/dL — ABNORMAL HIGH (ref <200)
HDL: 51 mg/dL (ref >=50)
LDL Cholesterol (Calc): 197 mg/dL (calc) — ABNORMAL HIGH
Non-HDL Cholesterol (Calc): 226 mg/dL (calc) — ABNORMAL HIGH (ref <130)
Total CHOL/HDL Ratio: 5.4 (calc) — ABNORMAL HIGH (ref <5.0)
Triglycerides: 139 mg/dL (ref <150)

## 2022-09-22 LAB — COMPLETE METABOLIC PANEL WITH GFR
AG Ratio: 1.6 (calc) (ref 1.0–2.5)
ALT: 6 U/L (ref 6–29)
AST: 12 U/L (ref 10–30)
Albumin: 4.2 g/dL (ref 3.6–5.1)
Alkaline phosphatase (APISO): 41 U/L (ref 31–125)
BUN/Creatinine Ratio: 8 (calc) (ref 6–22)
BUN: 8 mg/dL (ref 7–25)
CO2: 26 mmol/L (ref 20–32)
Calcium: 9.9 mg/dL (ref 8.6–10.2)
Chloride: 103 mmol/L (ref 98–110)
Creat: 0.98 mg/dL — ABNORMAL HIGH (ref 0.50–0.97)
Globulin: 2.7 g/dL (calc) (ref 1.9–3.7)
Glucose, Bld: 110 mg/dL — ABNORMAL HIGH (ref 65–99)
Potassium: 3.6 mmol/L (ref 3.5–5.3)
Sodium: 139 mmol/L (ref 135–146)
Total Bilirubin: 0.4 mg/dL (ref 0.2–1.2)
Total Protein: 6.9 g/dL (ref 6.1–8.1)
eGFR: 76 mL/min/{1.73_m2} (ref >=60)

## 2022-09-22 LAB — TSH: TSH: 0.73 mIU/L

## 2022-09-22 LAB — CBC
HCT: 36.5 % (ref 35.0–45.0)
Hemoglobin: 11.9 g/dL (ref 11.7–15.5)
MCH: 30.7 pg (ref 27.0–33.0)
MCHC: 32.6 g/dL (ref 32.0–36.0)
MCV: 94.3 fL (ref 80.0–100.0)
MPV: 10.4 fL (ref 7.5–12.5)
Platelets: 448 10*3/uL — ABNORMAL HIGH (ref 140–400)
RBC: 3.87 10*6/uL (ref 3.80–5.10)
RDW: 11.1 % (ref 11.0–15.0)
WBC: 6 10*3/uL (ref 3.8–10.8)

## 2022-09-22 LAB — VITAMIN D 25 HYDROXY (VIT D DEFICIENCY, FRACTURES): Vit D, 25-Hydroxy: 11 ng/mL — ABNORMAL LOW (ref 30–100)

## 2022-09-25 ENCOUNTER — Other Ambulatory Visit: Payer: Self-pay | Admitting: Internal Medicine

## 2022-09-25 DIAGNOSIS — N63 Unspecified lump in unspecified breast: Secondary | ICD-10-CM

## 2022-10-08 ENCOUNTER — Ambulatory Visit
Admission: RE | Admit: 2022-10-08 | Discharge: 2022-10-08 | Disposition: A | Discharge status: Home / Self Care | Payer: Medicare PPO | Source: Ambulatory Visit | Attending: Internal Medicine | Admitting: Internal Medicine

## 2022-10-08 ENCOUNTER — Ambulatory Visit: Payer: Medicare PPO

## 2022-10-08 DIAGNOSIS — N63 Unspecified lump in unspecified breast: Secondary | ICD-10-CM

## 2022-10-28 ENCOUNTER — Inpatient Hospital Stay (HOSPITAL_COMMUNITY): Payer: Medicare PPO

## 2022-10-28 ENCOUNTER — Encounter (HOSPITAL_COMMUNITY): Payer: Self-pay | Admitting: *Deleted

## 2022-10-28 ENCOUNTER — Inpatient Hospital Stay (HOSPITAL_COMMUNITY)
Admission: AD | Admit: 2022-10-28 | Discharge: 2022-10-29 | Disposition: A | Discharge status: Home / Self Care | Payer: Medicare PPO | Attending: Obstetrics & Gynecology | Admitting: Obstetrics & Gynecology

## 2022-10-28 DIAGNOSIS — O3680X0 Pregnancy with inconclusive fetal viability, not applicable or unspecified: Secondary | ICD-10-CM | POA: Insufficient documentation

## 2022-10-28 DIAGNOSIS — Z3201 Encounter for pregnancy test, result positive: Secondary | ICD-10-CM | POA: Insufficient documentation

## 2022-10-28 DIAGNOSIS — O200 Threatened abortion: Secondary | ICD-10-CM | POA: Insufficient documentation

## 2022-10-28 DIAGNOSIS — Z3A01 Less than 8 weeks gestation of pregnancy: Secondary | ICD-10-CM | POA: Insufficient documentation

## 2022-10-28 DIAGNOSIS — O209 Hemorrhage in early pregnancy, unspecified: Secondary | ICD-10-CM | POA: Diagnosis present

## 2022-10-28 LAB — COMPREHENSIVE METABOLIC PANEL
ALT: 11 U/L (ref 0–44)
AST: 15 U/L (ref 15–41)
Albumin: 3.3 g/dL — ABNORMAL LOW (ref 3.5–5.0)
Alkaline Phosphatase: 37 U/L — ABNORMAL LOW (ref 38–126)
Anion gap: 9 (ref 5–15)
BUN: 5 mg/dL — ABNORMAL LOW (ref 6–20)
CO2: 27 mmol/L (ref 22–32)
Calcium: 9.1 mg/dL (ref 8.9–10.3)
Chloride: 103 mmol/L (ref 98–111)
Creatinine, Ser: 0.91 mg/dL (ref 0.44–1.00)
GFR, Estimated: >60 mL/min (ref >60)
Glucose, Bld: 94 mg/dL (ref 70–99)
Potassium: 3.3 mmol/L — ABNORMAL LOW (ref 3.5–5.1)
Sodium: 139 mmol/L (ref 135–145)
Total Bilirubin: 0.4 mg/dL (ref 0.3–1.2)
Total Protein: 6.2 g/dL — ABNORMAL LOW (ref 6.5–8.1)

## 2022-10-28 LAB — POCT PREGNANCY, URINE: Preg Test, Ur: POSITIVE — AB

## 2022-10-28 LAB — URINALYSIS, ROUTINE W REFLEX MICROSCOPIC
Bacteria, UA: NONE SEEN
Bilirubin Urine: NEGATIVE
Glucose, UA: NEGATIVE mg/dL
Ketones, ur: NEGATIVE mg/dL
Leukocytes,Ua: NEGATIVE
Nitrite: NEGATIVE
Protein, ur: NEGATIVE mg/dL
RBC / HPF: >50 RBC/hpf (ref 0–5)
Specific Gravity, Urine: 1.023 (ref 1.005–1.030)
pH: 5 (ref 5.0–8.0)

## 2022-10-28 LAB — WET PREP, GENITAL
Clue Cells Wet Prep HPF POC: NONE SEEN
Sperm: NONE SEEN
Trich, Wet Prep: NONE SEEN
WBC, Wet Prep HPF POC: <10 (ref <10)
Yeast Wet Prep HPF POC: NONE SEEN

## 2022-10-28 LAB — CBC
HCT: 32.9 % — ABNORMAL LOW (ref 36.0–46.0)
Hemoglobin: 10.4 g/dL — ABNORMAL LOW (ref 12.0–15.0)
MCH: 31.4 pg (ref 26.0–34.0)
MCHC: 31.6 g/dL (ref 30.0–36.0)
MCV: 99.4 fL (ref 80.0–100.0)
Platelets: 501 10*3/uL — ABNORMAL HIGH (ref 150–400)
RBC: 3.31 MIL/uL — ABNORMAL LOW (ref 3.87–5.11)
RDW: 12.2 % (ref 11.5–15.5)
WBC: 9.9 10*3/uL (ref 4.0–10.5)
nRBC: 0 % (ref 0.0–0.2)

## 2022-10-28 LAB — HCG, QUANTITATIVE, PREGNANCY: hCG, Beta Chain, Quant, S: 46 m[IU]/mL — ABNORMAL HIGH (ref <5)

## 2022-10-28 NOTE — MAU Note (Signed)
..  [name] is a 36 y.o. at Unknown here in MAU reporting: Had Iud taken out in Feb started bleeding on 10/19/2022 the bleeding increased yesterday, now she is having to wear a maxi-pad No cramping, seemed like a regular period at first but then the bleeding began again 2 days ago went to the doctor today and was told she was pregnant. Last month she was not pregnant.  LMP: unsure  Pain score: 0/10 Vitals:   10/28/22 2101  BP: (!) 145/92  Pulse: 77  Resp: 16  Temp: 98.8 F (37.1 C)  SpO2: 100%    Denies CHTN but reports PIH

## 2022-10-28 NOTE — MAU Provider Note (Signed)
Chief Complaint: Vaginal Bleeding   Event Date/Time   First Provider Initiated Contact with Patient 10/28/22 2347        SUBJECTIVE HPI: [name] is a 37 y.o. [id number] at Unknown by LMP who presents to maternity admissions reporting vaginal bleeding with positive pregnancy test at doctor's office.  Just had IUD removed because of bleeding States was taking OCPs.    She denies vaginal bleeding, vaginal itching/burning, urinary symptoms, h/a, dizziness, n/v, or fever/chills.    Vaginal Bleeding The patient's primary symptoms include vaginal bleeding. The patient's pertinent negatives include no genital itching, genital odor or pelvic pain. This is a recurrent problem. The current episode started 1 to 4 weeks ago. The patient is experiencing no pain. She is pregnant. Pertinent negatives include no abdominal pain, chills, nausea or vomiting. The vaginal discharge was bloody. The vaginal bleeding is lighter than menses. She has not been passing clots. She has not been passing tissue. Nothing aggravates the symptoms. She has tried nothing for the symptoms.   RN Note: [name] is a 37 y.o. at Unknown here in MAU reporting: Had Iud taken out in Feb started bleeding on 10/19/2022 the bleeding increased yesterday, now she is having to wear a maxi-pad No cramping, seemed like a regular period at first but then the bleeding began again 2 days ago went to the doctor today and was told she was pregnant. Last month she was not pregnant.    LMP: unsure  \ Pain score: 0/10  Past Medical History:  Diagnosis Date   Abnormal Pap smear    Colpo>normal   Fibroid    Gonorrhea    Headache    last migraine 01/2017   History of IBS    watch diet, no meds   Infection    UTI   Multiple sclerosis (HCC)    Neuromuscular disorder (HCC)    diagnosed with MS this visit   Obesity    Ovarian cyst    Pernicious anemia 05/02/2013   Pregnancy induced hypertension    Preterm labor    all 3 deliveries   SVD  (spontaneous vaginal delivery)    x 3   Urinary tract infection    Vaginal Pap smear, abnormal    Past Surgical History:  Procedure Laterality Date   CHOLECYSTECTOMY N/A 11/25/2017   Procedure: LAPAROSCOPIC CHOLECYSTECTOMY WITH INTRAOPERATIVE CHOLANGIOGRAM;  Surgeon: Darnell Level, MD;  Location: [facility];  Service: General;  Laterality: N/A;   COLPOSCOPY     MYOMECTOMY N/A 04/01/2017   Procedure: Vaginal Myomectomy;  Surgeon: Tereso Newcomer, MD;  Location: [facility];  Service: Gynecology;  Laterality: N/A;   TUBAL LIGATION     2012/ reversal June 2020   WISDOM TOOTH EXTRACTION     Social History   Socioeconomic History   Marital status: Single    Spouse name: Not on file   Number of children: 4   Years of education: 13   Highest education level: Not on file  Occupational History   Occupation: UNEMPLOYED-DISABLE    Comment: [employer]  Tobacco Use   Smoking status: Never   Smokeless tobacco: Never  Vaping Use   Vaping Use: Never used  Substance and Sexual Activity   Alcohol use: No    Alcohol/week: 0.0 standard drinks of alcohol    Comment:     Drug use: No    Comment: denies use, states was given CBD gummies for nausea   Sexual activity: Yes  Partners: Male  Other Topics Concern   Not on file  Social History Narrative   Lives with 4 children. Good support group in her community including mother who lives close.    Social Determinants of Health   Financial Resource Strain: Not on file  Food Insecurity: Not on file  Transportation Needs: Not on file  Physical Activity: Not on file  Stress: Not on file  Social Connections: Not on file  Intimate Partner Violence: Not on file   No current facility-administered medications on file prior to encounter.   Current Outpatient Medications on File Prior to Encounter  Medication Sig Dispense Refill   acetaminophen (TYLENOL) 325 MG tablet Take 650 mg by mouth every 6 (six) hours as needed.     aspirin EC 81 MG  tablet Take 1 tablet (81 mg total) by mouth daily. Take after 12 weeks for prevention of preeclampsia later in pregnancy (Patient not taking: Reported on 09/04/2020) 300 tablet 2   Blood Pressure Monitoring (BLOOD PRESSURE KIT) DEVI 1 kit by Does not apply route once a week. Check Blood Pressure regularly and record readings into the Babys[employer] App.  Large Cuff.  DX O90.0 (Patient not taking: Reported on 08/12/2022) 1 each 0   drospirenone-ethinyl estradiol (LORYNA) 3-0.02 MG tablet Take 1 tablet by mouth daily. 28 tablet 2   Elastic Bandages & Supports (COMFORT FIT MATERNITY SUPP MED) MISC 1 Device by Does not apply route daily. (Patient not taking: Reported on 09/04/2020) 1 each 0   famotidine (PEPCID) 20 MG tablet Take 1 tablet (20 mg total) by mouth 2 (two) times daily. (Patient not taking: Reported on 08/12/2022) 60 tablet 2   gabapentin (NEURONTIN) 400 MG capsule Take 300 mg by mouth 3 (three) times daily.     hydrOXYzine (ATARAX/VISTARIL) 25 MG tablet Take 1 tablet (25 mg total) by mouth every 6 (six) hours as needed for itching. (Patient not taking: Reported on 09/04/2020) 30 tablet 2   ibuprofen (ADVIL) 600 MG tablet Take 1 tablet (600 mg total) by mouth every 6 (six) hours. (Patient not taking: Reported on 08/12/2022) 30 tablet 0   oxyCODONE-acetaminophen (PERCOCET/ROXICET) 5-325 MG tablet Take by mouth every 4 (four) hours as needed for severe pain.     Prenatal Vit-Fe Fumarate-FA (PRENATAL MULTIVITAMIN) TABS tablet Take 1 tablet by mouth daily at 12 noon. (Patient not taking: Reported on 09/04/2020)     No Known Allergies  I have reviewed patient's Past Medical Hx, Surgical Hx, Family Hx, Social Hx, medications and allergies.   ROS:  Review of Systems  Constitutional:  Negative for chills.  Gastrointestinal:  Negative for abdominal pain, nausea and vomiting.  Genitourinary:  Positive for vaginal bleeding. Negative for pelvic pain.   Review of Systems  Other systems negative   Physical  Exam  Physical Exam Patient Vitals for the past 24 hrs:  BP Temp Temp src Pulse Resp SpO2 Height Weight  10/28/22 2101 (!) 145/92 98.8 F (37.1 C) Oral 77 16 100 % 5\' 4"  (1.626 m) 116.1 kg   Constitutional: Well-developed, well-nourished female in no acute distress.  Cardiovascular: normal rate Respiratory: normal effort GI: Abd soft, non-tender.  MS: Extremities nontender, no edema, normal ROM Neurologic: Alert and oriented x 4.  GU: Neg CVAT.  PELVIC EXAM: deferred    LAB RESULTS Results for orders placed or performed during the hospital encounter of 10/28/22 (from the past 24 hour(s))  Pregnancy, urine POC     Status: Abnormal   Collection Time: 10/28/22  7:29 PM  Result Value Ref Range   Preg Test, Ur POSITIVE (A) NEGATIVE  Urinalysis, Routine w reflex microscopic -Urine, Clean Catch     Status: Abnormal   Collection Time: 10/28/22  7:33 PM  Result Value Ref Range   Color, Urine YELLOW YELLOW   APPearance CLEAR CLEAR   Specific Gravity, Urine 1.023 1.005 - 1.030   pH 5.0 5.0 - 8.0   Glucose, UA NEGATIVE NEGATIVE mg/dL   Hgb urine dipstick LARGE (A) NEGATIVE   Bilirubin Urine NEGATIVE NEGATIVE   Ketones, ur NEGATIVE NEGATIVE mg/dL   Protein, ur NEGATIVE NEGATIVE mg/dL   Nitrite NEGATIVE NEGATIVE   Leukocytes,Ua NEGATIVE NEGATIVE   RBC / HPF >50 0 - 5 RBC/hpf   WBC, UA 0-5 0 - 5 WBC/hpf   Bacteria, UA NONE SEEN NONE SEEN   Squamous Epithelial / HPF 0-5 0 - 5 /HPF   Mucus PRESENT   Wet prep, genital     Status: None   Collection Time: 10/28/22  8:49 PM  Result Value Ref Range   Yeast Wet Prep HPF POC NONE SEEN NONE SEEN   Trich, Wet Prep NONE SEEN NONE SEEN   Clue Cells Wet Prep HPF POC NONE SEEN NONE SEEN   WBC, Wet Prep HPF POC <10 <10   Sperm NONE SEEN   CBC     Status: Abnormal   Collection Time: 10/28/22  9:14 PM  Result Value Ref Range   WBC 9.9 4.0 - 10.5 K/uL   RBC 3.31 (L) 3.87 - 5.11 MIL/uL   Hemoglobin 10.4 (L) 12.0 - 15.0 g/dL   HCT 40.9 (L) 81.1  - 46.0 %   MCV 99.4 80.0 - 100.0 fL   MCH 31.4 26.0 - 34.0 pg   MCHC 31.6 30.0 - 36.0 g/dL   RDW 91.4 78.2 - 95.6 %   Platelets 501 (H) 150 - 400 K/uL   nRBC 0.0 0.0 - 0.2 %  Comprehensive metabolic panel     Status: Abnormal   Collection Time: 10/28/22  9:14 PM  Result Value Ref Range   Sodium 139 135 - 145 mmol/L   Potassium 3.3 (L) 3.5 - 5.1 mmol/L   Chloride 103 98 - 111 mmol/L   CO2 27 22 - 32 mmol/L   Glucose, Bld 94 70 - 99 mg/dL   BUN 5 (L) 6 - 20 mg/dL   Creatinine, Ser 2.13 0.44 - 1.00 mg/dL   Calcium 9.1 8.9 - 08.6 mg/dL   Total Protein 6.2 (L) 6.5 - 8.1 g/dL   Albumin 3.3 (L) 3.5 - 5.0 g/dL   AST 15 15 - 41 U/L   ALT 11 0 - 44 U/L   Alkaline Phosphatase 37 (L) 38 - 126 U/L   Total Bilirubin 0.4 0.3 - 1.2 mg/dL   GFR, Estimated >57 >84 mL/min   Anion gap 9 5 - 15  hCG, quantitative, pregnancy     Status: Abnormal   Collection Time: 10/28/22  9:14 PM  Result Value Ref Range   hCG, Beta Chain, Quant, S 46 (H) <5 mIU/mL       IMAGING US OB LESS THAN 14 WEEKS WITH OB TRANSVAGINAL  Result Date: 10/28/2022 CLINICAL DATA:  Vaginal bleeding x9 days. EXAM: OBSTETRIC <14 WK [place] AND TRANSVAGINAL OB US TECHNIQUE: Both transabdominal and transvaginal ultrasound examinations were performed for complete evaluation of the gestation as well as the maternal uterus, adnexal regions, and pelvic cul-de-sac. Transvaginal technique was performed to assess early pregnancy. COMPARISON:  None Available. FINDINGS: Intrauterine gestational sac: None Yolk sac:  Not Visualized. Embryo:  Not Visualized. Cardiac Activity: Not Visualized. Heart Rate: N/A  bpm Subchorionic hemorrhage:  None visualized. Maternal uterus/adnexae: The right ovary measures 3.4 cm x 3.5 cm x 1.9 cm and is normal in appearance. The left ovary measures 2.9 cm x 2.1 cm x 1.5 cm and is normal in appearance. No pelvic free fluid is seen. IMPRESSION: No evidence of an intrauterine pregnancy. Correlation with follow-up pelvic  ultrasound and serial beta HCG levels is recommended if this remains of clinical concern. Electronically Signed   By: Aram Candela M.D.   On: 10/28/2022 22:11    MAU Management/MDM: I have reviewed the triage vital signs and the nursing notes.   Pertinent labs & imaging results that were available during my care of the patient were reviewed by me and considered in my medical decision making (see chart for details).      I have reviewed her medical records including past results, notes and treatments. Medical, Surgical, and family history were reviewed.  Medications and recent lab tests were reviewed  Ordered usual first trimester r/o ectopic labs.   Pelvic exam and cultures done Will check baseline Ultrasound to rule out ectopic.  This bleeding/pain can represent a normal pregnancy with bleeding, spontaneous abortion or even an ectopic which can be life-threatening.  The process as listed above helps to determine which of these is present.  Reviewed results with patient Discussed we cannot know just yet if the HCG level is going up or down, thus it could be either a normal pregnancy or not Recommend repeating in 48 hours   ASSESSMENT Pregnancy at early gestation, likely less than 5 weeks Bleeding  Pregnancy of unknown location  PLAN Discharge home Plan to repeat HCG level in 48 hours in MAU Will repeat  Ultrasound in about 7-10 days if HCG levels double appropriately  Ectopic precautions   Pt stable at time of discharge. Encouraged to return here if she develops worsening of symptoms, increase in pain, fever, or other concerning symptoms.    Wynelle Bourgeois CNM, MSN Certified Nurse-Midwife 10/28/2022  11:47 PM

## 2022-10-29 DIAGNOSIS — Z3A01 Less than 8 weeks gestation of pregnancy: Secondary | ICD-10-CM | POA: Diagnosis not present

## 2022-10-29 DIAGNOSIS — O3680X0 Pregnancy with inconclusive fetal viability, not applicable or unspecified: Secondary | ICD-10-CM

## 2022-10-29 DIAGNOSIS — O209 Hemorrhage in early pregnancy, unspecified: Secondary | ICD-10-CM

## 2022-10-29 LAB — GC/CHLAMYDIA PROBE AMP (~~LOC~~) NOT AT ARMC
Chlamydia: NEGATIVE
Comment: NEGATIVE
Comment: NORMAL
Neisseria Gonorrhea: NEGATIVE

## 2022-10-30 ENCOUNTER — Inpatient Hospital Stay (HOSPITAL_COMMUNITY)
Admission: AD | Admit: 2022-10-30 | Discharge: 2022-10-30 | Disposition: A | Discharge status: Home / Self Care | Payer: Medicare PPO | Attending: Obstetrics and Gynecology | Admitting: Obstetrics and Gynecology

## 2022-10-30 DIAGNOSIS — M549 Dorsalgia, unspecified: Secondary | ICD-10-CM | POA: Diagnosis not present

## 2022-10-30 DIAGNOSIS — O0281 Inappropriate change in quantitative human chorionic gonadotropin (hCG) in early pregnancy: Secondary | ICD-10-CM | POA: Insufficient documentation

## 2022-10-30 DIAGNOSIS — O3680X0 Pregnancy with inconclusive fetal viability, not applicable or unspecified: Secondary | ICD-10-CM | POA: Insufficient documentation

## 2022-10-30 LAB — HCG, QUANTITATIVE, PREGNANCY: hCG, Beta Chain, Quant, S: 26 m[IU]/mL — ABNORMAL HIGH (ref <5)

## 2022-10-30 NOTE — MAU Note (Signed)
.  [name] is a 37 y.o. at Unknown here in MAU reporting: here for repeat BHCG. Reports vag bleeding less than it was the other day. Denies any abd pain or cramping at this time.  LMP: no period since Feb when IUD was removed Onset of complaint:  Pain score: 0 There were no vitals filed for this visit.    Lab orders placed from triage:  BHCG Was told she did not heave to wait for results that they will call her.

## 2022-10-30 NOTE — MAU Provider Note (Signed)
Subjective:  [name] is a 37 y.o. [phone] at Unknown who presents today for FU BHCG. She was seen on  Results from that day show no IUP on [place], and HCG 46. She denies vaginal bleeding. She reports back pain, no lower abdominal pain.  Objective:  Physical Exam  Nursing note and vitals reviewed. Constitutional: She is oriented to person, place, and time. She appears well-developed and well-nourished. No distress.  HENT:  Head: Normocephalic.  Cardiovascular: Normal rate.  Respiratory: Effort normal.  GI: Soft. There is no tenderness.  Neurological: She is alert and oriented to person, place, and time. Skin: Skin is warm and dry.  Psychiatric: She has a normal mood and affect.   Results for orders placed or performed during the hospital encounter of 10/30/22 (from the past 24 hour(s))  hCG, quantitative, pregnancy     Status: Abnormal   Collection Time: 10/30/22  1:11 PM  Result Value Ref Range   hCG, Beta Chain, Quant, S 26 (H) <5 mIU/mL    Assessment/Plan: Pregnancy of unknown location HCG dropped from 46-26 FU in 1 weeks for: repeat labs Message sent to the office to coordinate.   Duane Lope, NP 10/30/2022 3:42 PM

## 2022-11-06 ENCOUNTER — Other Ambulatory Visit: Payer: Medicare PPO

## 2022-11-22 ENCOUNTER — Emergency Department (HOSPITAL_BASED_OUTPATIENT_CLINIC_OR_DEPARTMENT_OTHER)
Admission: EM | Admit: 2022-11-22 | Discharge: 2022-11-22 | Disposition: A | Discharge status: Home / Self Care | Payer: Medicare PPO | Attending: Emergency Medicine | Admitting: Emergency Medicine

## 2022-11-22 ENCOUNTER — Encounter (HOSPITAL_BASED_OUTPATIENT_CLINIC_OR_DEPARTMENT_OTHER): Payer: Self-pay

## 2022-11-22 DIAGNOSIS — R197 Diarrhea, unspecified: Secondary | ICD-10-CM | POA: Diagnosis not present

## 2022-11-22 DIAGNOSIS — R112 Nausea with vomiting, unspecified: Secondary | ICD-10-CM | POA: Diagnosis present

## 2022-11-22 LAB — URINALYSIS, ROUTINE W REFLEX MICROSCOPIC
Bilirubin Urine: NEGATIVE
Glucose, UA: NEGATIVE mg/dL
Ketones, ur: 40 mg/dL — AB
Leukocytes,Ua: NEGATIVE
Nitrite: NEGATIVE
Specific Gravity, Urine: 1.028 (ref 1.005–1.030)
pH: 6.5 (ref 5.0–8.0)

## 2022-11-22 LAB — COMPREHENSIVE METABOLIC PANEL
ALT: 7 U/L (ref 0–44)
AST: 14 U/L — ABNORMAL LOW (ref 15–41)
Albumin: 4.4 g/dL (ref 3.5–5.0)
Alkaline Phosphatase: 40 U/L (ref 38–126)
Anion gap: 8 (ref 5–15)
BUN: 10 mg/dL (ref 6–20)
CO2: 24 mmol/L (ref 22–32)
Calcium: 9.2 mg/dL (ref 8.9–10.3)
Chloride: 104 mmol/L (ref 98–111)
Creatinine, Ser: 0.87 mg/dL (ref 0.44–1.00)
GFR, Estimated: >60 mL/min (ref >60)
Glucose, Bld: 71 mg/dL (ref 70–99)
Potassium: 4.1 mmol/L (ref 3.5–5.1)
Sodium: 136 mmol/L (ref 135–145)
Total Bilirubin: 0.7 mg/dL (ref 0.3–1.2)
Total Protein: 7.2 g/dL (ref 6.5–8.1)

## 2022-11-22 LAB — CBC
HCT: 38.4 % (ref 36.0–46.0)
Hemoglobin: 12.3 g/dL (ref 12.0–15.0)
MCH: 30.3 pg (ref 26.0–34.0)
MCHC: 32 g/dL (ref 30.0–36.0)
MCV: 94.6 fL (ref 80.0–100.0)
Platelets: 492 10*3/uL — ABNORMAL HIGH (ref 150–400)
RBC: 4.06 MIL/uL (ref 3.87–5.11)
RDW: 12 % (ref 11.5–15.5)
WBC: 6.5 10*3/uL (ref 4.0–10.5)
nRBC: 0 % (ref 0.0–0.2)

## 2022-11-22 LAB — LIPASE, BLOOD: Lipase: 10 U/L — ABNORMAL LOW (ref 11–51)

## 2022-11-22 LAB — PREGNANCY, URINE: Preg Test, Ur: NEGATIVE

## 2022-11-22 MED ORDER — ONDANSETRON HCL 4 MG/2ML IJ SOLN
4.0000 mg | Freq: Once | INTRAMUSCULAR | Status: AC
  Administered 2022-11-22: 4 mg via INTRAVENOUS
  Filled 2022-11-22: qty 2

## 2022-11-22 MED ORDER — FAMOTIDINE IN NACL 20-0.9 MG/50ML-% IV SOLN
20.0000 mg | Freq: Once | INTRAVENOUS | Status: AC
  Administered 2022-11-22: 20 mg via INTRAVENOUS
  Filled 2022-11-22: qty 50

## 2022-11-22 MED ORDER — ONDANSETRON 4 MG PO TBDP: 4.0000 mg | ORAL_TABLET | Freq: Three times a day (TID) | ORAL | 0 refills | Status: DC | PRN

## 2022-11-22 MED ORDER — SODIUM CHLORIDE 0.9 % IV BOLUS
1000.0000 mL | Freq: Once | INTRAVENOUS | Status: AC
  Administered 2022-11-22: 1000 mL via INTRAVENOUS

## 2022-11-22 NOTE — ED Provider Notes (Signed)
  Physical Exam  BP (!) 162/95 (BP Location: Right Arm)   Pulse 80   Temp 98.5 F (36.9 C)   Resp 16   SpO2 100%   Physical Exam  Procedures  Procedures  ED Course / MDM    Medical Decision Making Amount and/or Complexity of Data Reviewed Labs: ordered.  Risk Prescription drug management.   Received in signout.  Nausea vomiting diarrhea.  Potential poisoning.  Feels better with treatment.  Has some dehydration but tolerated orals.  Will discharge home with symptomatic treatment.       Benjiman Core, MD 11/22/22 1640

## 2022-11-22 NOTE — ED Triage Notes (Signed)
She c/o n/v/d occasionally x 2 days. She mentions she has m.s. IV attempted at triage.

## 2022-11-22 NOTE — Discharge Instructions (Signed)
You are seen in the emergency department for nausea vomiting diarrhea.  You had lab work done and were given IV fluids and nausea medication.  We are sending a prescription for nausea medication to the pharmacy.  Please start with a clear liquid diet advance as tolerated.  Follow-up with your regular doctor.  Return to the emergency department if any worsening or concerning symptoms

## 2022-11-22 NOTE — ED Provider Notes (Signed)
[place] EMERGENCY DEPARTMENT AT [facility] Provider Note   CSN: [id number] Arrival date & time: 11/22/22  1247     History  Chief Complaint  Patient presents with   Abdominal Pain    [name] is a 37 y.o. female.  She has a history of MS.  She said she went out to eat at [place] [employer] Friday and since then has had nausea vomiting and diarrhea.  She had diarrhea is pretty much stopped but she still nauseous and cannot hold anything down.  Has been trying some over-the-counter nausea medication without any improvement.  Stomach cramps with her vomiting but no significant abdominal pain otherwise.  No blood from either end.  No fevers but has felt chilled.  No sick contacts.  The history is provided by the patient.  Emesis Severity:  Moderate Duration:  3 days Timing:  Intermittent Quality:  Bilious material Progression:  Unchanged Chronicity:  New Relieved by:  Nothing Worsened by:  Liquids Ineffective treatments:  Antiemetics Associated symptoms: abdominal pain, chills and diarrhea   Associated symptoms: no cough, no fever, no myalgias and no sore throat   Risk factors: suspect food intake   Risk factors: no sick contacts and no travel to endemic areas        Home Medications Prior to Admission medications   Medication Sig Start Date End Date Taking? Authorizing Provider  acetaminophen (TYLENOL) 325 MG tablet Take 650 mg by mouth every 6 (six) hours as needed.    [provider]  aspirin EC 81 MG tablet Take 1 tablet (81 mg total) by mouth daily. Take after 12 weeks for prevention of preeclampsia later in pregnancy Patient not taking: Reported on 09/04/2020 06/03/20   Constant, Peggy, MD  Blood Pressure Monitoring (BLOOD PRESSURE KIT) DEVI 1 kit by Does not apply route once a week. Check Blood Pressure regularly and record readings into the Babys[employer]s App.  Large Cuff.  DX O90.0 Patient not taking: Reported on 08/12/2022 02/20/20   Johnny Bridge,  MD  drospirenone-ethinyl estradiol (LORYNA) 3-0.02 MG tablet Take 1 tablet by mouth daily. 08/12/22   Raelyn Mora, CNM  Elastic Bandages & Supports (COMFORT FIT MATERNITY SUPP MED) MISC 1 Device by Does not apply route daily. Patient not taking: Reported on 09/04/2020 04/08/20   Hurshel Party, CNM  famotidine (PEPCID) 20 MG tablet Take 1 tablet (20 mg total) by mouth 2 (two) times daily. Patient not taking: Reported on 08/12/2022 07/03/20   Warden Fillers, MD  gabapentin (NEURONTIN) 400 MG capsule Take 300 mg by mouth 3 (three) times daily.    [provider]  hydrOXYzine (ATARAX/VISTARIL) 25 MG tablet Take 1 tablet (25 mg total) by mouth every 6 (six) hours as needed for itching. Patient not taking: Reported on 09/04/2020 07/12/20   Tereso Newcomer, MD  oxyCODONE-acetaminophen (PERCOCET/ROXICET) 5-325 MG tablet Take by mouth every 4 (four) hours as needed for severe pain.    [provider]  Prenatal Vit-Fe Fumarate-FA (PRENATAL MULTIVITAMIN) TABS tablet Take 1 tablet by mouth daily at 12 noon. Patient not taking: Reported on 09/04/2020    [provider]      Allergies    Patient has no known allergies.    Review of Systems   Review of Systems  Constitutional:  Positive for chills. Negative for fever.  HENT:  Negative for sore throat.   Respiratory:  Negative for cough.   Cardiovascular:  Negative for chest pain.  Gastrointestinal:  Positive  for abdominal pain, diarrhea and vomiting.  Genitourinary:  Negative for dysuria.  Musculoskeletal:  Negative for myalgias.    Physical Exam Updated Vital Signs BP (!) 138/98 (BP Location: Right Arm)   Pulse 83   Temp 98.5 F (36.9 C)   Resp 18   SpO2 100%  Physical Exam Vitals and nursing note reviewed.  Constitutional:      General: She is not in acute distress.    Appearance: She is well-developed.  HENT:     Head: Normocephalic and atraumatic.  Eyes:     Conjunctiva/sclera: Conjunctivae  normal.  Cardiovascular:     Rate and Rhythm: Normal rate and regular rhythm.     Heart sounds: No murmur heard. Pulmonary:     Effort: Pulmonary effort is normal. No respiratory distress.     Breath sounds: Normal breath sounds.  Abdominal:     Palpations: Abdomen is soft.     Tenderness: There is no abdominal tenderness. There is no guarding or rebound.  Musculoskeletal:        General: No swelling.     Cervical back: Neck supple.  Skin:    General: Skin is warm and dry.     Capillary Refill: Capillary refill takes less than 2 seconds.  Neurological:     Mental Status: She is alert.     ED Results / Procedures / Treatments   Labs (all labs ordered are listed, but only abnormal results are displayed) Labs Reviewed  LIPASE, BLOOD - Abnormal; Notable for the following components:      Result Value   Lipase 10 (*)    All other components within normal limits  COMPREHENSIVE METABOLIC PANEL - Abnormal; Notable for the following components:   AST 14 (*)    All other components within normal limits  CBC - Abnormal; Notable for the following components:   Platelets 492 (*)    All other components within normal limits  URINALYSIS, ROUTINE W REFLEX MICROSCOPIC - Abnormal; Notable for the following components:   Hgb urine dipstick LARGE (*)    Ketones, ur 40 (*)    Protein, ur TRACE (*)    Bacteria, UA RARE (*)    All other components within normal limits  PREGNANCY, URINE    EKG None  Radiology No results found.  Procedures Procedures    Medications Ordered in ED Medications  sodium chloride 0.9 % bolus 1,000 mL ( Intravenous Stopped 11/22/22 1532)  ondansetron (ZOFRAN) injection 4 mg (4 mg Intravenous Given 11/22/22 1429)  famotidine (PEPCID) IVPB 20 mg premix (0 mg Intravenous Stopped 11/22/22 1504)    ED Course/ Medical Decision Making/ A&P                             Medical Decision Making Amount and/or Complexity of Data Reviewed Labs:  ordered.  Risk Prescription drug management.   This patient complains of abdominal cramps nausea vomiting diarrhea; this involves an extensive number of treatment Options and is a complaint that carries with it a high risk of complications and morbidity. The differential includes gastroenteritis, food poisoning, dehydration, AKI, obstruction, pancreatitis  I ordered, reviewed and interpreted labs, which included CBC with normal white count normal hemoglobin, chemistries and LFTs normal, lipase normal, urinalysis without clear signs of infection I ordered medication IV fluids nausea medication, Pepcid and reviewed PMP when indicated. Previous records obtained and reviewed in epic no recent admissions Cardiac monitoring reviewed, normal sinus rhythm Social  determinants considered, no significant barriers Critical Interventions: None  After the interventions stated above, I reevaluated the patient and found patient to be nontoxic-appearing benign abdominal exam patient's care signed out to Dr. Rubin Payor to follow-up on response to treatment of medications.  Likely to be discharged and follow-up outpatient with PCP. Admission and further testing considered, no indications for admission or further workup at this time           Final Clinical Impression(s) / ED Diagnoses Final diagnoses:  Nausea vomiting and diarrhea    Rx / DC Orders ED Discharge Orders          Ordered    ondansetron (ZOFRAN-ODT) 4 MG disintegrating tablet  Every 8 hours PRN        11/22/22 1417              Terrilee Files, MD 11/22/22 1645

## 2023-04-13 ENCOUNTER — Ambulatory Visit: Payer: Medicare PPO | Admitting: Family Medicine

## 2023-04-13 ENCOUNTER — Encounter: Payer: Self-pay | Admitting: Family Medicine

## 2023-04-13 VITALS — BP 141/94 | HR 66 | Wt 252.6 lb

## 2023-04-13 DIAGNOSIS — Z30016 Encounter for initial prescription of transdermal patch hormonal contraceptive device: Secondary | ICD-10-CM

## 2023-04-13 DIAGNOSIS — Z3202 Encounter for pregnancy test, result negative: Secondary | ICD-10-CM | POA: Diagnosis not present

## 2023-04-13 LAB — POCT URINE PREGNANCY: Preg Test, Ur: NEGATIVE

## 2023-04-13 MED ORDER — NORELGESTROMIN-ETH ESTRADIOL 150-35 MCG/24HR TD PTWK
1.0000 | MEDICATED_PATCH | TRANSDERMAL | 12 refills | Status: DC
Start: 2023-04-13 — End: 2024-01-12

## 2023-04-13 NOTE — Progress Notes (Signed)
GYNECOLOGY OFFICE VISIT NOTE  History:   [name] is a 37 y.o. [phone] here today for birth control. Patient states that she would like to do the patch, denies clotting hx, migraines with auras, smoking hx. Upreg negative.  She denies any abnormal vaginal discharge, bleeding, pelvic pain or other concerns.    Past Medical History:  Diagnosis Date   Abnormal Pap smear    Colpo>normal   Fibroid    Gonorrhea    Headache    last migraine 01/2017   History of IBS    watch diet, no meds   Infection    UTI   Multiple sclerosis (HCC)    Neuromuscular disorder (HCC)    diagnosed with MS this visit   Obesity    Ovarian cyst    Pernicious anemia 05/02/2013   Pregnancy induced hypertension    Preterm labor    all 3 deliveries   SVD (spontaneous vaginal delivery)    x 3   Urinary tract infection    Vaginal Pap smear, abnormal     Past Surgical History:  Procedure Laterality Date   CHOLECYSTECTOMY N/A 11/25/2017   Procedure: LAPAROSCOPIC CHOLECYSTECTOMY WITH INTRAOPERATIVE CHOLANGIOGRAM;  Surgeon: Darnell Level, MD;  Location: [facility];  Service: General;  Laterality: N/A;   COLPOSCOPY     MYOMECTOMY N/A 04/01/2017   Procedure: Vaginal Myomectomy;  Surgeon: Tereso Newcomer, MD;  Location: [facility];  Service: Gynecology;  Laterality: N/A;   TUBAL LIGATION     2012/ reversal June 2020   WISDOM TOOTH EXTRACTION      The following portions of the patient's history were reviewed and updated as appropriate: allergies, current medications, past family history, past medical history, past social history, past surgical history and problem list.   Health Maintenance:  Normal pap and negative HRHPV on 02/26/2020.  Normal mammogram on 10/08/2022 (fat necrosis/oil cyst within inner L breast 1.3cm corresponding to area of concern).   Review of Systems:  Pertinent items noted in HPI and remainder of comprehensive ROS otherwise negative.  Physical Exam:  BP (!) 141/94   Pulse 66   Wt 252  lb 9.6 oz (114.6 kg)   BMI 43.36 kg/m  CONSTITUTIONAL: Well-developed, well-nourished female in no acute distress.  HEENT:  Normocephalic, atraumatic. External right and left ear normal. No scleral icterus.  NECK: Normal range of motion, supple, no masses noted on observation SKIN: No rash noted. Not diaphoretic. No erythema. No pallor. MUSCULOSKELETAL: Normal range of motion. No edema noted. NEUROLOGIC: Alert and oriented to person, place, and time. Normal muscle tone coordination.  PSYCHIATRIC: Normal mood and affect. Normal behavior. Normal judgment and thought content. CARDIOVASCULAR: Normal heart rate noted RESPIRATORY: Effort and breath sounds normal, no problems with respiration noted ABDOMEN: No masses noted. No other overt distention noted.   PELVIC: Deferred  Labs and Imaging No results found for this or any previous visit (from the past 168 hour(s)). No results found.    Assessment and Plan:      1. Encounter for initial prescription of transdermal patch hormonal contraceptive device - POCT urine pregnancy - norelgestromin-ethinyl estradiol [employer]) 150-35 MCG/24HR transdermal patch; Place 1 patch onto the skin once a week.  Dispense: 3 patch; Refill: 12     Routine preventative health maintenance measures emphasized. Please refer to After Visit Summary for other counseling recommendations.   Return in about 3 months (around 07/14/2023) for f/u contraception .     Hessie Dibble, MD OB Fellow, Faculty  Practice [place], [facility] 04/13/2023 9:10 AM

## 2023-04-13 NOTE — Progress Notes (Signed)
Pt. Presents for birth control. Wants to try the patch

## 2023-06-11 ENCOUNTER — Other Ambulatory Visit: Payer: Self-pay

## 2023-06-11 DIAGNOSIS — J069 Acute upper respiratory infection, unspecified: Secondary | ICD-10-CM | POA: Insufficient documentation

## 2023-06-11 DIAGNOSIS — G35 Multiple sclerosis: Secondary | ICD-10-CM | POA: Diagnosis not present

## 2023-06-11 DIAGNOSIS — R059 Cough, unspecified: Secondary | ICD-10-CM | POA: Diagnosis present

## 2023-06-11 DIAGNOSIS — Z20822 Contact with and (suspected) exposure to covid-19: Secondary | ICD-10-CM | POA: Diagnosis not present

## 2023-06-11 DIAGNOSIS — Z79899 Other long term (current) drug therapy: Secondary | ICD-10-CM | POA: Diagnosis not present

## 2023-06-11 DIAGNOSIS — R509 Fever, unspecified: Secondary | ICD-10-CM | POA: Insufficient documentation

## 2023-06-11 DIAGNOSIS — B9789 Other viral agents as the cause of diseases classified elsewhere: Secondary | ICD-10-CM | POA: Insufficient documentation

## 2023-06-11 MED ORDER — IBUPROFEN 800 MG PO TABS
800.0000 mg | ORAL_TABLET | Freq: Once | ORAL | Status: AC
  Administered 2023-06-11: 800 mg via ORAL
  Filled 2023-06-11: qty 1

## 2023-06-11 NOTE — ED Triage Notes (Signed)
POV from home/ pt c/o sore throat, fever, chills, body aches, cough,tinnitus/ pt has MS/ pt concerned of PNA/ mucinex taken PTA/ pt A&Ox4, ambulatory

## 2023-06-12 ENCOUNTER — Encounter (HOSPITAL_BASED_OUTPATIENT_CLINIC_OR_DEPARTMENT_OTHER): Payer: Self-pay | Admitting: Emergency Medicine

## 2023-06-12 ENCOUNTER — Emergency Department (HOSPITAL_BASED_OUTPATIENT_CLINIC_OR_DEPARTMENT_OTHER): Payer: Medicare PPO | Admitting: Radiology

## 2023-06-12 ENCOUNTER — Emergency Department (HOSPITAL_BASED_OUTPATIENT_CLINIC_OR_DEPARTMENT_OTHER)
Admission: EM | Admit: 2023-06-12 | Discharge: 2023-06-12 | Disposition: A | Discharge status: Home / Self Care | Payer: Medicare PPO | Attending: Emergency Medicine | Admitting: Emergency Medicine

## 2023-06-12 DIAGNOSIS — J069 Acute upper respiratory infection, unspecified: Secondary | ICD-10-CM | POA: Diagnosis not present

## 2023-06-12 LAB — RESP PANEL BY RT-PCR (RSV, FLU A&B, COVID)  RVPGX2
Influenza A by PCR: NEGATIVE
Influenza B by PCR: NEGATIVE
Resp Syncytial Virus by PCR: NEGATIVE
SARS Coronavirus 2 by RT PCR: NEGATIVE

## 2023-06-12 MED ORDER — IPRATROPIUM-ALBUTEROL 0.5-2.5 (3) MG/3ML IN SOLN
3.0000 mL | Freq: Once | RESPIRATORY_TRACT | Status: AC
  Administered 2023-06-12: 3 mL via RESPIRATORY_TRACT
  Filled 2023-06-12: qty 3

## 2023-06-12 MED ORDER — PREDNISONE 50 MG PO TABS
60.0000 mg | ORAL_TABLET | Freq: Once | ORAL | Status: AC
  Administered 2023-06-12: 60 mg via ORAL
  Filled 2023-06-12: qty 1

## 2023-06-12 MED ORDER — ALBUTEROL SULFATE HFA 108 (90 BASE) MCG/ACT IN AERS: 2.0000 | INHALATION_SPRAY | RESPIRATORY_TRACT | Status: DC | PRN

## 2023-06-12 MED ORDER — PREDNISONE 50 MG PO TABS: 50.0000 mg | ORAL_TABLET | Freq: Every day | ORAL | 0 refills | Status: DC

## 2023-06-12 MED ORDER — ALBUTEROL SULFATE HFA 108 (90 BASE) MCG/ACT IN AERS
2.0000 | INHALATION_SPRAY | RESPIRATORY_TRACT | Status: DC | PRN
  Administered 2023-06-12: 2 via RESPIRATORY_TRACT
  Filled 2023-06-12: qty 6.7

## 2023-06-12 NOTE — Discharge Instructions (Addendum)
Drink plenty of fluids.  Take ibuprofen and/or acetaminophen as needed for fever or aching.  Use the inhaler-2 puffs at a time, every 4 hours-as needed for coughing or tightness in your chest.  Return if symptoms are getting worse.

## 2023-06-12 NOTE — ED Provider Notes (Signed)
[place] EMERGENCY DEPARTMENT AT [facility] Provider Note   CSN: [id number] Arrival date & time: 06/11/23  2317     History  Chief Complaint  Patient presents with   Fever    [name] is a 37 y.o. female.  The history is provided by the patient.  Fever She has history of multiple sclerosis and comes in with 2-day history of cough productive of small amount of white sputum, fever to 100.5, chills, sweats.  She has had some bodyaches as well.  There have been no known sick contacts.  Because of multiple sclerosis history, she is concerned about possibility of pneumonia.   Home Medications Prior to Admission medications   Medication Sig Start Date End Date Taking? Authorizing Provider  acetaminophen (TYLENOL) 325 MG tablet Take 650 mg by mouth every 6 (six) hours as needed.    [provider]  drospirenone-ethinyl estradiol (LORYNA) 3-0.02 MG tablet Take 1 tablet by mouth daily. 08/12/22   Raelyn Mora, CNM  gabapentin (NEURONTIN) 400 MG capsule Take 300 mg by mouth 3 (three) times daily.    [provider]  norelgestromin-ethinyl estradiol [employer]) 150-35 MCG/24HR transdermal patch Place 1 patch onto the skin once a week. 04/13/23   Hessie Dibble, MD  ondansetron (ZOFRAN-ODT) 4 MG disintegrating tablet Take 1 tablet (4 mg total) by mouth every 8 (eight) hours as needed for nausea or vomiting. 11/22/22   Terrilee Files, MD  oxyCODONE-acetaminophen (PERCOCET/ROXICET) 5-325 MG tablet Take by mouth every 4 (four) hours as needed for severe pain.    [provider]      Allergies    Patient has no known allergies.    Review of Systems   Review of Systems  Constitutional:  Positive for fever.  All other systems reviewed and are negative.   Physical Exam Updated Vital Signs BP 138/82 (BP Location: Right Arm)   Pulse 88   Temp 99.2 F (37.3 C) (Oral)   Resp 18   Wt 109.8 kg   LMP 04/30/2023 (Exact Date)   SpO2 99%    Breastfeeding No   BMI 41.54 kg/m  Physical Exam Vitals and nursing note reviewed.   37 year old female, resting comfortably and in no acute distress. Vital signs are for initial elevation of temperature. Oxygen saturation is 99%, which is normal. Head is normocephalic and atraumatic. PERRLA, EOMI. Oropharynx is clear. Neck is nontender and supple without adenopathy. Lungs are clear without rales, wheezes, or rhonchi.  There is a slightly prolonged exhalation phase. Chest is nontender. Heart has regular rate and rhythm without murmur. Abdomen is soft, flat, nontender. Skin is warm and dry without rash. Neurologic: Mental status is normal, moves all extremities equally.  ED Results / Procedures / Treatments   Labs (all labs ordered are listed, but only abnormal results are displayed) Labs Reviewed  RESP PANEL BY RT-PCR (RSV, FLU A&B, COVID)  [id number]   Radiology DG Chest 2 View Result Date: 06/12/2023 CLINICAL DATA:  cough, fever c/o sore throat, fever, chills, body aches, cough,tinnitus/ pt has MS/ pt concerned of PNA EXAM: CHEST - 2 VIEW COMPARISON:  Chest x-ray 09/29/2019 FINDINGS: The heart and mediastinal contours are within normal limits. No focal consolidation. No pulmonary edema. No pleural effusion. No pneumothorax. No acute osseous abnormality. IMPRESSION: No active cardiopulmonary disease. Electronically Signed   By: Tish Frederickson M.D.   On: 06/12/2023 03:10    Procedures Procedures    Medications Ordered in ED Medications  predniSONE (  DELTASONE) tablet 60 mg (has no administration in time range)  albuterol (VENTOLIN HFA) 108 (90 Base) MCG/ACT inhaler 2 puff (has no administration in time range)  ibuprofen (ADVIL) tablet 800 mg (800 mg Oral Given 06/11/23 2328)  ipratropium-albuterol (DUONEB) 0.5-2.5 (3) MG/3ML nebulizer solution 3 mL (3 mLs Nebulization Given 06/12/23 6578)    ED Course/ Medical Decision Making/ A&P                                 Medical  Decision Making Amount and/or Complexity of Data Reviewed Radiology: ordered.  Risk Prescription drug management.   Respiratory tract infection-viral versus bacterial.  Consider COVID-19, RSV, influenza, other viruses.  I have reviewed her laboratory test, and my interpretation is negative PCR for COVID-19, influenza, RSV.  I have ordered a chest x-ray.  She does show evidence of bronchospasm on exam, I have ordered a nebulizer treatment with albuterol and ipratropium.  Chest x-ray shows no evidence of pneumonia.  Have independently viewed the images, and agree with the radiologist's interpretation.  She feels much better following albuterol and ipratropium.  I have ordered a dose of prednisone and I am giving her an albuterol inhaler to take home, I am giving a prescription for a 5-day course of prednisone.  Return precautions discussed.  Final Clinical Impression(s) / ED Diagnoses Final diagnoses:  Viral URI with cough    Rx / DC Orders ED Discharge Orders          Ordered    predniSONE (DELTASONE) 50 MG tablet  Daily        06/12/23 0324    albuterol (VENTOLIN HFA) 108 (90 Base) MCG/ACT inhaler  Every 4 hours PRN        06/12/23 0324              Dione Booze, MD 06/12/23 [phone]

## 2023-11-18 ENCOUNTER — Encounter (HOSPITAL_BASED_OUTPATIENT_CLINIC_OR_DEPARTMENT_OTHER): Payer: Self-pay | Admitting: Emergency Medicine

## 2023-11-18 ENCOUNTER — Emergency Department (HOSPITAL_COMMUNITY)

## 2023-11-18 ENCOUNTER — Other Ambulatory Visit: Payer: Self-pay

## 2023-11-18 ENCOUNTER — Emergency Department (HOSPITAL_BASED_OUTPATIENT_CLINIC_OR_DEPARTMENT_OTHER)
Admission: EM | Admit: 2023-11-18 | Discharge: 2023-11-18 | Disposition: A | Discharge status: Home / Self Care | Attending: Emergency Medicine | Admitting: Emergency Medicine

## 2023-11-18 DIAGNOSIS — R202 Paresthesia of skin: Secondary | ICD-10-CM | POA: Diagnosis not present

## 2023-11-18 DIAGNOSIS — G35 Multiple sclerosis: Secondary | ICD-10-CM | POA: Diagnosis not present

## 2023-11-18 DIAGNOSIS — R200 Anesthesia of skin: Secondary | ICD-10-CM | POA: Diagnosis present

## 2023-11-18 LAB — CBC WITH DIFFERENTIAL/PLATELET
Abs Immature Granulocytes: 0.01 10*3/uL (ref 0.00–0.07)
Basophils Absolute: 0.1 10*3/uL (ref 0.0–0.1)
Basophils Relative: 1 %
Eosinophils Absolute: 0 10*3/uL (ref 0.0–0.5)
Eosinophils Relative: 1 %
HCT: 39.9 % (ref 36.0–46.0)
Hemoglobin: 12.5 g/dL (ref 12.0–15.0)
Immature Granulocytes: 0 %
Lymphocytes Relative: 34 %
Lymphs Abs: 1.7 10*3/uL (ref 0.7–4.0)
MCH: 29.9 pg (ref 26.0–34.0)
MCHC: 31.3 g/dL (ref 30.0–36.0)
MCV: 95.5 fL (ref 80.0–100.0)
Monocytes Absolute: 0.4 10*3/uL (ref 0.1–1.0)
Monocytes Relative: 8 %
Neutro Abs: 2.8 10*3/uL (ref 1.7–7.7)
Neutrophils Relative %: 56 %
Platelets: 458 10*3/uL — ABNORMAL HIGH (ref 150–400)
RBC: 4.18 MIL/uL (ref 3.87–5.11)
RDW: 11.9 % (ref 11.5–15.5)
WBC: 5 10*3/uL (ref 4.0–10.5)
nRBC: 0 % (ref 0.0–0.2)

## 2023-11-18 LAB — URINALYSIS, ROUTINE W REFLEX MICROSCOPIC
Bacteria, UA: NONE SEEN
Bilirubin Urine: NEGATIVE
Glucose, UA: NEGATIVE mg/dL
Ketones, ur: NEGATIVE mg/dL
Leukocytes,Ua: NEGATIVE
Nitrite: NEGATIVE
Protein, ur: NEGATIVE mg/dL
Specific Gravity, Urine: 1.013 (ref 1.005–1.030)
pH: 6.5 (ref 5.0–8.0)

## 2023-11-18 LAB — BASIC METABOLIC PANEL WITH GFR
Anion gap: 12 (ref 5–15)
BUN: 9 mg/dL (ref 6–20)
CO2: 25 mmol/L (ref 22–32)
Calcium: 9.7 mg/dL (ref 8.9–10.3)
Chloride: 100 mmol/L (ref 98–111)
Creatinine, Ser: 1 mg/dL (ref 0.44–1.00)
GFR, Estimated: >60 mL/min (ref >60)
Glucose, Bld: 94 mg/dL (ref 70–99)
Potassium: 4.1 mmol/L (ref 3.5–5.1)
Sodium: 137 mmol/L (ref 135–145)

## 2023-11-18 LAB — PREGNANCY, URINE: Preg Test, Ur: NEGATIVE

## 2023-11-18 LAB — CBG MONITORING, ED: Glucose-Capillary: 97 mg/dL (ref 70–99)

## 2023-11-18 MED ORDER — GADOBUTROL 1 MMOL/ML IV SOLN
10.0000 mL | Freq: Once | INTRAVENOUS | Status: AC | PRN
  Administered 2023-11-18: 10 mL via INTRAVENOUS

## 2023-11-18 NOTE — ED Provider Triage Note (Signed)
 Emergency Medicine Provider Triage Evaluation Note  [name] , a 38 y.o. female  was evaluated in triage.  Pt complains of burning paresthesias involving the distal arms and legs and also numbness of the face and neck.  Patient reports symptoms are similar to previous multiple sclerosis flares.  She is currently on controller medication.  She also reports history of B12 deficiency but has not received shots recently.  Review of Systems  Positive: Paresthesias Negative: Weakness  Physical Exam  BP (!) 150/109 (BP Location: Right Arm)   Pulse 77   Temp (!) 97.5 F (36.4 C)   Resp 17   LMP 11/08/2023   SpO2 100%  Gen:   Awake, no distress   Resp:  Normal effort  MSK:   Moves extremities without difficulty  Other:    Medical Decision Making  Medically screening exam initiated at 12:08 PM.  Appropriate orders placed.  DOREE KUEHNE was informed that the remainder of the evaluation will be completed by another provider, this initial triage assessment does not replace that evaluation, and the importance of remaining in the ED until their evaluation is complete.     Lyna Sandhoff, PA-C 11/18/23 1209

## 2023-11-18 NOTE — ED Triage Notes (Signed)
 Reports numbness on right side of body x 3 days. Intermittent  "Last time I felt like this was when they dx me with MS"  Moved all extremities, ambulatory to triage   Seen by PA in triage

## 2023-11-18 NOTE — ED Provider Notes (Signed)
 I assumed care of the patient on arrival [facility].  Patient with history of MS some concern for a MS flare.  Sent for MRI.  MRI has been obtained.  Patient is awake alert ambulatory.  No obvious weakness.  MRI is resulted without acute demyelination.  I discussed this with Dr. Murvin Arthurs, neuro. felt okay for discharge and outpatient follow-up.    Albertus Hughs, DO 11/18/23 2251

## 2023-11-18 NOTE — Discharge Instructions (Signed)
 The neurologist thinks your MRI looks okay.  Please follow-up with your neurologist in the office.

## 2023-11-18 NOTE — ED Notes (Signed)
 Second call placed to [facility] MD @ [phone] to [place] PA @ [phone]

## 2023-11-18 NOTE — ED Provider Notes (Signed)
 [place] EMERGENCY DEPARTMENT AT [facility] Provider Note   CSN: [id number] Arrival date & time: 11/18/23  1128     History  Chief Complaint  Patient presents with   Numbness    [name] is a 38 y.o. female.  Patient with history of MS diagnosed ~ 2014, followed in [facility], currently on Ocrevus (Ocrelizumab) last administered 09/29/2023 -- presents with burning paresthesias involving the distal arms and legs and also numbness of the left face and neck. The neck symptoms feel like electrical shocks. Symptoms started about 3 days ago. Patient reports symptoms are similar to previous multiple sclerosis flares.  She also reports not receiving B12 and vitamin D  as recommended.  No weakness.  No vision changes other than some blurry vision.  No recent illness.  In the past she has been admitted for IV steroids and other times just prescribed oral steroids.       Home Medications Prior to Admission medications   Medication Sig Start Date End Date Taking? Authorizing Provider  acetaminophen  (TYLENOL ) 325 MG tablet Take 650 mg by mouth every 6 (six) hours as needed.    [provider]  albuterol  (VENTOLIN  HFA) 108 (90 Base) MCG/ACT inhaler Inhale 2 puffs into the lungs every 4 (four) hours as needed for wheezing or shortness of breath. 06/12/23   Alissa April, MD  drospirenone -ethinyl estradiol  (LORYNA ) 3-0.02 MG tablet Take 1 tablet by mouth daily. 08/12/22   Dawson, Rolitta, CNM  gabapentin  (NEURONTIN ) 400 MG capsule Take 300 mg by mouth 3 (three) times daily.    [provider]  norelgestromin -ethinyl estradiol  (XULANE) 150-35 MCG/24HR transdermal patch Place 1 patch onto the skin once a week. 04/13/23   Ebony Goldstein, MD  ondansetron  (ZOFRAN -ODT) 4 MG disintegrating tablet Take 1 tablet (4 mg total) by mouth every 8 (eight) hours as needed for nausea or vomiting. 11/22/22   Tonya Fredrickson, MD  oxyCODONE -acetaminophen  (PERCOCET/ROXICET) 5-325  MG tablet Take by mouth every 4 (four) hours as needed for severe pain.    [provider]  predniSONE  (DELTASONE ) 50 MG tablet Take 1 tablet (50 mg total) by mouth daily. 06/12/23   Alissa April, MD      Allergies    Patient has no known allergies.    Review of Systems   Review of Systems  Physical Exam Updated Vital Signs BP (!) 150/109 (BP Location: Right Arm)   Pulse 77   Temp (!) 97.5 F (36.4 C)   Resp 17   LMP 11/08/2023   SpO2 100%   Physical Exam Vitals and nursing note reviewed.  Constitutional:      Appearance: She is well-developed.  HENT:     Head: Normocephalic and atraumatic.     Right Ear: External ear normal.     Left Ear: External ear normal.     Nose: Nose normal.     Mouth/Throat:     Pharynx: Uvula midline.  Eyes:     General: Lids are normal.     Extraocular Movements:     Right eye: No nystagmus.     Left eye: No nystagmus.     Conjunctiva/sclera: Conjunctivae normal.     Pupils: Pupils are equal, round, and reactive to light.  Cardiovascular:     Rate and Rhythm: Normal rate and regular rhythm.  Pulmonary:     Effort: Pulmonary effort is normal.     Breath sounds: Normal breath sounds.  Abdominal:     Palpations: Abdomen  is soft.     Tenderness: There is no abdominal tenderness.  Musculoskeletal:     Cervical back: Normal range of motion and neck supple. No tenderness or bony tenderness.  Skin:    General: Skin is warm and dry.  Neurological:     Mental Status: She is alert and oriented to person, place, and time.     GCS: GCS eye subscore is 4. GCS verbal subscore is 5. GCS motor subscore is 6.     Cranial Nerves: Cranial nerve deficit (left face sensory only) present.     Sensory: Sensory deficit present.     Motor: No weakness.     Coordination: Coordination normal.     Gait: Gait normal.     Comments: Upper extremity myotomes tested bilaterally:  C5 Shoulder abduction 5/5 C6 Elbow flexion/wrist extension 5/5 C7 Elbow  extension 5/5 C8 Finger flexion 5/5 T1 Finger abduction 5/5  Lower extremity myotomes tested bilaterally: L2 Hip flexion 5/5 L3 Knee extension 5/5 L4 Ankle dorsiflexion 5/5 S1 Ankle plantar flexion 5/5      ED Results / Procedures / Treatments   Labs (all labs ordered are listed, but only abnormal results are displayed) Labs Reviewed  CBC WITH DIFFERENTIAL/PLATELET - Abnormal; Notable for the following components:      Result Value   Platelets 458 (*)    All other components within normal limits  URINALYSIS, ROUTINE W REFLEX MICROSCOPIC - Abnormal; Notable for the following components:   Hgb urine dipstick MODERATE (*)    All other components within normal limits  BASIC METABOLIC PANEL WITH GFR  PREGNANCY, URINE  VITAMIN B12  CBG MONITORING, ED  CBG MONITORING, ED    EKG None  Radiology No results found.  Procedures Procedures    Medications Ordered in ED Medications - No data to display  ED Course/ Medical Decision Making/ A&P    Patient seen and examined. History obtained directly from patient. Work-up including labs, imaging, EKG ordered in triage, if performed, were reviewed.    Labs/EKG: Independently reviewed and interpreted.  This included: CBC slightly elevated platelets otherwise unremarkable; BMP unremarkable; urine pregnancy negative; UA with moderate blood, no compelling signs of infection.  Imaging: None ordered, will likely need MRI  Medications/Fluids: None ordered  Most recent vital signs reviewed and are as follows: BP (!) 150/109 (BP Location: Right Arm)   Pulse 77   Temp (!) 97.5 F (36.4 C)   Resp 17   LMP 11/08/2023   SpO2 100%   Initial impression: Possible exacerbation of multiple sclerosis.  3:21 PM Discussed case with Dr. Cleone Dad.   Recommends MRI of the brain and cervical spine with and without contrast.  Recommends awaiting imaging results prior to treating with steroids.  Patient will need to be transferred to [facility].  I gave the patient the option of transfer POV versus by ambulance.  Patient drove herself to the emergency department today.  She does not feel that her current symptoms would preclude safe transport and she would like to go by private vehicle.  She did report feeling some palpitations.  She had a normal heart rate and regular rhythm.  EKG was performed showing normal sinus rhythm without any abnormalities.  I then revisited transfer decision with patient, she is comfortable with POV transport.  I discussed case with Dr. Aldean Amass at [facility] who accepts for transfer.  Note to next provider: Patient transferred for MRI of the brain and C-spine with suspicion for multiple sclerosis  exacerbation.  Patient's extremity paresthesias are similar to previous flares, however her left-sided lip and facial paresthesias are different than her typical.  Please contact neurology after completion of imaging results to discuss treatment plan.  Awaiting imaging results prior to treatment.  It is unclear whether she will need inpatient versus outpatient treatment at this time.  Per neuro, please make sure B12 is sent. Appropriate tube for this test not obtained at [facility].                                 Medical Decision Making Amount and/or Complexity of Data Reviewed Labs: ordered.   Patient with worsening distal extremity paresthesias and facial numbness and tingling.  No weakness.  Recommendations from neurology obtained.  Patient is otherwise at baseline.  No vision loss.  Sent for further imaging to determine appropriate treatment plan.        Final Clinical Impression(s) / ED Diagnoses Final diagnoses:  Paresthesia  H/O multiple sclerosis [facility])    Rx / DC Orders ED Discharge Orders     None         Lyna Sandhoff, PA-C 11/18/23 1714    Arvilla Birmingham, MD 11/19/23 [phone]

## 2023-12-15 ENCOUNTER — Other Ambulatory Visit (HOSPITAL_COMMUNITY)
Admission: RE | Admit: 2023-12-15 | Discharge: 2023-12-15 | Disposition: A | Discharge status: Home / Self Care | Source: Ambulatory Visit | Attending: Obstetrics and Gynecology | Admitting: Obstetrics and Gynecology

## 2023-12-15 ENCOUNTER — Ambulatory Visit: Admitting: Obstetrics and Gynecology

## 2023-12-15 ENCOUNTER — Encounter: Payer: Self-pay | Admitting: Obstetrics and Gynecology

## 2023-12-15 VITALS — BP 141/87 | HR 65 | Wt 256.2 lb

## 2023-12-15 DIAGNOSIS — D259 Leiomyoma of uterus, unspecified: Secondary | ICD-10-CM

## 2023-12-15 DIAGNOSIS — Z01419 Encounter for gynecological examination (general) (routine) without abnormal findings: Secondary | ICD-10-CM | POA: Diagnosis not present

## 2023-12-15 DIAGNOSIS — Z113 Encounter for screening for infections with a predominantly sexual mode of transmission: Secondary | ICD-10-CM | POA: Insufficient documentation

## 2023-12-15 DIAGNOSIS — Z1231 Encounter for screening mammogram for malignant neoplasm of breast: Secondary | ICD-10-CM | POA: Diagnosis not present

## 2023-12-15 DIAGNOSIS — N8003 Adenomyosis of the uterus: Secondary | ICD-10-CM

## 2023-12-15 DIAGNOSIS — Z124 Encounter for screening for malignant neoplasm of cervix: Secondary | ICD-10-CM

## 2023-12-15 DIAGNOSIS — Z1151 Encounter for screening for human papillomavirus (HPV): Secondary | ICD-10-CM | POA: Diagnosis not present

## 2023-12-15 DIAGNOSIS — Z0000 Encounter for general adult medical examination without abnormal findings: Secondary | ICD-10-CM

## 2023-12-15 DIAGNOSIS — Z1331 Encounter for screening for depression: Secondary | ICD-10-CM | POA: Diagnosis not present

## 2023-12-15 DIAGNOSIS — N941 Unspecified dyspareunia: Secondary | ICD-10-CM

## 2023-12-15 DIAGNOSIS — N926 Irregular menstruation, unspecified: Secondary | ICD-10-CM | POA: Diagnosis not present

## 2023-12-15 DIAGNOSIS — N393 Stress incontinence (female) (male): Secondary | ICD-10-CM | POA: Diagnosis not present

## 2023-12-15 DIAGNOSIS — Z3202 Encounter for pregnancy test, result negative: Secondary | ICD-10-CM

## 2023-12-15 DIAGNOSIS — Z3042 Encounter for surveillance of injectable contraceptive: Secondary | ICD-10-CM | POA: Diagnosis not present

## 2023-12-15 LAB — POCT URINE PREGNANCY: Preg Test, Ur: NEGATIVE

## 2023-12-15 MED ORDER — MEDROXYPROGESTERONE ACETATE 150 MG/ML IM SUSP: 150.0000 mg | INTRAMUSCULAR | 3 refills | Status: DC

## 2023-12-15 MED ORDER — MEDROXYPROGESTERONE ACETATE 150 MG/ML IM SUSP
150.0000 mg | Freq: Once | INTRAMUSCULAR | Status: AC
  Administered 2023-12-15: 150 mg via INTRAMUSCULAR

## 2023-12-15 NOTE — Progress Notes (Signed)
 Family history of cervical cancer. Has irregular periods. Not interested in being on [facility], would like to discuss possible partial hysterectomy.    Monthly pregnancy test due to MS meds (IV every 6 months).  Had mammogram within the last year.  Scored 3 on PHQ and 12 on GAD.

## 2023-12-15 NOTE — Progress Notes (Signed)
 ANNUAL EXAM Patient name: [name] MRN [id number]  Date of birth: [birth year] Chief Complaint:   No chief complaint on file.  History of Present Illness:   [name] is a 38 y.o. [phone] female being seen today for a routine annual exam.   Current concerns: irregular periods, leaking urine when coughing, laughing, or sneezing, pain with intercourse. Desires hysterectomy  Current birth control: withdrawal  Patient reports history of irregular periods her whole life even prior to children & tubal ligation/reversal. Reports periods are 10-14 days long and she will sometimes skip months completely. She has tried OCPs, patch, & multiple IUDs without success. Has history of cervical fibroid removal in 2018. Had ultrasound in 2020 suspicious for adenomyosis. Patient also reports leakage of urine when coughing, laughing or sneezing since last child in 2022 as well as pain with intercourse for the past 6 months. Patient reports no issues with lubrication and pain feels deep.  Patient's last menstrual period was 11/08/2023.   Last MXR: April 2024 for palpable lump, normal. Will schedule for screening mammogram. Last Pap/Pap History: 2021. Results were: normal. H/O abnormal pap: yes many years ago between 2004-2009, had colpo but normal paps since then  Review of Systems:   Pertinent items are noted in HPI Denies any headaches, blurred vision, fatigue, shortness of breath, chest pain, abdominal pain, abnormal vaginal discharge/itching/odor/irritation, , bowel movements unless otherwise stated above.  Pertinent History Reviewed:  Reviewed past medical,surgical, social and family history.  Reviewed problem list, medications and allergies. Physical Assessment:   Vitals:   12/15/23 0818 12/15/23 0838  BP: (!) 153/97 (!) 141/87  Pulse: 66 65  Weight: 116.2 kg   Body mass index is 43.98 kg/m.   Physical Examination:  General appearance - well appearing, and in no distress Mental  status - alert, oriented to person, place, and time Psych:  She has a normal mood and affect Skin - warm and dry, normal color, no suspicious lesions noted Chest - effort normal Heart - normal rate  Breasts - breasts appear normal, no suspicious masses, no skin or nipple changes or axillary nodes Abdomen - soft, nontender, nondistended, no masses or organomegaly Pelvic -  Performed and: VULVA: normal appearing vulva with no masses, tenderness or lesions  VAGINA: normal appearing vagina with normal color and discharge, no lesions  CERVIX: normal appearing cervix without discharge or lesions, no CMT UTERUS: tender to palpation at midline/suprapubic ADNEXA: No adnexal masses or tenderness noted. Extremities:  No swelling or varicosities noted  Chaperone present for exam  No results found for this or any previous visit (from the past 24 hours).  Assessment & Plan:  Diagnoses and all orders for this visit:  Annual physical exam -     POCT urine pregnancy -     Amb ref to [facility]  Screen for STD (sexually transmitted disease) -     Cervicovaginal ancillary only( [place]) -     Hepatitis B Surface AntiGEN -     Hepatitis C Antibody -     HIV antibody (with reflex) -     RPR  Encounter for screening mammogram for malignant neoplasm of breast -     MM 3D SCREENING MAMMOGRAM BILATERAL BREAST; Future  Cervical cancer screening -     Cytology - PAP( [place])  Dyspareunia in female -     Ambulatory referral to Physical Therapy  Stress incontinence -     Ambulatory referral to Physical Therapy  Fibroid of cervix -     US  PELVIC COMPLETE WITH TRANSVAGINAL; Future  Adenomyosis -     US  PELVIC COMPLETE WITH TRANSVAGINAL; Future  Irregular bleeding -     US  PELVIC COMPLETE WITH TRANSVAGINAL; Future  -would like to try Depo today for irregular bleeding and birth control but would still like to meet with a doctor to discuss hysterectomy. Will schedule for  pelvic ultrasound since last scan was in 2020.   Orders Placed This Encounter  Procedures   MM 3D SCREENING MAMMOGRAM BILATERAL BREAST   US  PELVIC COMPLETE WITH TRANSVAGINAL   Hepatitis B Surface AntiGEN   Hepatitis C Antibody   HIV antibody (with reflex)   RPR   Amb ref to [facility]   Ambulatory referral to Physical Therapy   POCT urine pregnancy    Meds:  Meds ordered this encounter  Medications   medroxyPROGESTERone  (DEPO-PROVERA ) injection 150 mg   medroxyPROGESTERone  (DEPO-PROVERA ) 150 MG/ML injection    Sig: Inject 1 mL (150 mg total) into the muscle every 3 (three) months.    Dispense:  1 mL    Refill:  3    Follow-up: Return in about 1 month (around 01/15/2024), or follow up with MD to discussed irregular bleeding.   Elenor Mole, [facility] 12/15/2023 9:48 AM

## 2023-12-16 ENCOUNTER — Ambulatory Visit: Payer: Self-pay | Admitting: Obstetrics and Gynecology

## 2023-12-16 LAB — CERVICOVAGINAL ANCILLARY ONLY
Bacterial Vaginitis (gardnerella): NEGATIVE
Candida Glabrata: NEGATIVE
Candida Vaginitis: NEGATIVE
Chlamydia: NEGATIVE
Comment: NEGATIVE
Comment: NEGATIVE
Comment: NEGATIVE
Comment: NEGATIVE
Comment: NEGATIVE
Comment: NORMAL
Neisseria Gonorrhea: NEGATIVE
Trichomonas: NEGATIVE

## 2023-12-16 LAB — HIV ANTIBODY (ROUTINE TESTING W REFLEX): HIV Screen 4th Generation wRfx: NONREACTIVE

## 2023-12-16 LAB — CYTOLOGY - PAP
Comment: NEGATIVE
Diagnosis: NEGATIVE
High risk HPV: NEGATIVE

## 2023-12-16 LAB — HEPATITIS C ANTIBODY: Hep C Virus Ab: NONREACTIVE

## 2023-12-16 LAB — RPR: RPR Ser Ql: NONREACTIVE

## 2023-12-16 LAB — HEPATITIS B SURFACE ANTIGEN: Hepatitis B Surface Ag: NEGATIVE

## 2023-12-22 ENCOUNTER — Ambulatory Visit (HOSPITAL_COMMUNITY)
Admission: RE | Admit: 2023-12-22 | Discharge: 2023-12-22 | Disposition: A | Discharge status: Home / Self Care | Source: Ambulatory Visit | Attending: Obstetrics and Gynecology | Admitting: Obstetrics and Gynecology

## 2023-12-22 DIAGNOSIS — N8003 Adenomyosis of the uterus: Secondary | ICD-10-CM | POA: Insufficient documentation

## 2023-12-22 DIAGNOSIS — N926 Irregular menstruation, unspecified: Secondary | ICD-10-CM | POA: Insufficient documentation

## 2023-12-22 DIAGNOSIS — D259 Leiomyoma of uterus, unspecified: Secondary | ICD-10-CM | POA: Insufficient documentation

## 2023-12-27 ENCOUNTER — Ambulatory Visit

## 2023-12-29 ENCOUNTER — Ambulatory Visit: Admitting: Licensed Clinical Social Worker

## 2023-12-29 DIAGNOSIS — F4323 Adjustment disorder with mixed anxiety and depressed mood: Secondary | ICD-10-CM

## 2023-12-29 DIAGNOSIS — G35 Multiple sclerosis: Secondary | ICD-10-CM

## 2023-12-29 NOTE — BH Specialist Note (Signed)
 Integrated Behavioral Health via Telemedicine Visit  01/02/2024 AUDRIA TAKESHITA [id number]  Number of Integrated Behavioral Health Clinician visits: 1- Initial Visit  Session Start time: 1045   Session End time: 1146  Total time in minutes: 61    Referring Provider: Nidia Daring Patient/Family location: Work Office  [facility] Provider location: Research scientist (medical)  All persons participating in visit: Patient and [facility]  Types of Service: Individual psychotherapy and Telephone visit  I connected with [name] Staff and/or [name] Washington's patient via  Telephone or Engineer, civil (consulting)  (Video is Caregility application) and verified that I am speaking with the correct person using two identifiers. Discussed confidentiality: Yes   I discussed the limitations of telemedicine and the availability of in person appointments.  Discussed there is a possibility of technology failure and discussed alternative modes of communication if that failure occurs.  I discussed that engaging in this telemedicine visit, they consent to the provision of behavioral healthcare and the services will be billed under their insurance.  Patient and/or legal guardian expressed understanding and consented to Telemedicine visit: Yes   Presenting Concerns: Patient and/or family reports the following symptoms/concerns: Increased anxiety symptoms  Duration of problem: Months; Severity of problem: moderate  Patient and/or Family's Strengths/Protective Factors: Concrete supports in place (healthy food, safe environments, etc.), Physical Health (exercise, healthy diet, medication compliance, etc.), Caregiver has knowledge of parenting & child development, and Parental Resilience  Goals Addressed: Patient will:  Reduce symptoms of: anxiety   Increase knowledge and/or ability of: coping skills and healthy habits   Demonstrate ability to: Increase healthy adjustment to current life circumstances and Increase  adequate support systems for patient/family  Progress towards Goals: Ongoing    Interventions: Interventions utilized:  Mindfulness or Management consultant, Supportive Counseling, Psychoeducation and/or Health Education, Communication Skills, and Supportive Reflection Standardized Assessments completed: Not Needed    Patient and/or Family Response: Patient was present for today's virtual visit. Patient, a 38 year old mother of five (ages ranging from 3 to 64), reports recent onset of symptoms she associates with perimenopause. She endorses irregular menstrual cycles, hot flashes, night sweats, sleep disturbances, mood fluctuations, vaginal dryness, and cognitive difficulties such as brain fog and decreased concentration. Patient also reports increased anxiety related to recent bodily changes since turning 38. 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 During the session, patient engaged in collaborative discussion with [facility] regarding lifestyle modifications to help manage symptoms. Strategies explored included establishing a consistent sleep routine, reducing screen time before bed, limiting caffeine  and processed food intake, increasing consumption of leafy greens, engaging in light exercise, and incorporating mindfulness practices such as journaling, meditation, and maintaining social connections.   Clinical Assessment/Diagnosis  Adjustment disorder with mixed anxiety and depressed mood  Multiple sclerosis (HCC)    Assessment: Patient currently experiencing symptoms consistent with perimenopause, including hormonal, emotional, and cognitive changes, compounded by occupational burnout and increased anxiety related to recent life transitions. These symptoms are impacting her overall well-being and daily functioning.   Patient may benefit from continued support of integrated  behavioral health services.  Plan: Follow up with behavioral health clinician on : 01/07/24 Behavioral recommendations: Patient to include establishing a consistent sleep routine, reducing screen time and caffeine  intake, improving nutrition with more leafy greens, and incorporating stress-reducing activities such as light exercise, meditation, journaling, and maintaining social connections to support emotional and physical well-being. Continued  monitoring of mood and cognitive symptoms is also advised. Referral(s): Integrated [employer] (In Clinic)  I discussed the assessment and treatment plan with the patient and/or parent/guardian. They were provided an opportunity to ask questions and all were answered. They agreed with the plan and demonstrated an understanding of the instructions.   They were advised to call back or seek an in-person evaluation if the symptoms worsen or if the condition fails to improve as anticipated.  Ona Rathert LITTIE Seats, LCSWA

## 2024-01-05 ENCOUNTER — Ambulatory Visit
Admission: RE | Admit: 2024-01-05 | Discharge: 2024-01-05 | Disposition: A | Discharge status: Home / Self Care | Source: Ambulatory Visit | Attending: Obstetrics and Gynecology | Admitting: Obstetrics and Gynecology

## 2024-01-05 DIAGNOSIS — Z1231 Encounter for screening mammogram for malignant neoplasm of breast: Secondary | ICD-10-CM

## 2024-01-07 ENCOUNTER — Ambulatory Visit (INDEPENDENT_AMBULATORY_CARE_PROVIDER_SITE_OTHER): Payer: Self-pay | Admitting: Licensed Clinical Social Worker

## 2024-01-07 DIAGNOSIS — G35 Multiple sclerosis: Secondary | ICD-10-CM

## 2024-01-07 DIAGNOSIS — R69 Illness, unspecified: Secondary | ICD-10-CM

## 2024-01-07 DIAGNOSIS — F4323 Adjustment disorder with mixed anxiety and depressed mood: Secondary | ICD-10-CM

## 2024-01-07 NOTE — BH Specialist Note (Signed)
 Integrated Behavioral Health via Telemedicine Visit   [name] [id number]  Number of Integrated Behavioral Health Clinician visits: 2- Second Visit  Session Start time: 0945   Session End time: 0957  Total time in minutes: 12    Referring Provider: Nidia [name] Patient/Family location: Work Office  [name] Provider location: Research scientist (medical) All persons participating in visit: Patient and [name] Types of Service: Individual psychotherapy and Video visit  I connected with [name] Staff and/or [name] [name]'s patient via  Telephone or Engineer, civil (consulting)  (Video is Caregility application) and verified that I am speaking with the correct person using two identifiers. Discussed confidentiality: Yes   I discussed the limitations of telemedicine and the availability of in person appointments.  Discussed there is a possibility of technology failure and discussed alternative modes of communication if that failure occurs.  I discussed that engaging in this telemedicine visit, they consent to the provision of behavioral healthcare and the services will be billed under their insurance.  Patient and/or legal guardian expressed understanding and consented to Telemedicine visit: Yes    Patient and/or Family Response: Patient connected [name] virtually on this date and advised she is now connected to  [name] for ongoing outpatient therapy. Sessions were discontinued. [name] recommended patient to continue follow through of services.      Clinical Assessment/Diagnosis  Diagnosis deferred    Patient may benefit from continued follow through with [name]. .  Plan: Follow up with behavioral health clinician on : No follow up needed.  Behavioral recommendations: Patient to continue to follow through with [name].  Referral(s): Integrated [employer] (In Clinic)  I discussed the assessment and  treatment plan with the patient and/or parent/guardian. They were provided an opportunity to ask questions and all were answered. They agreed with the plan and demonstrated an understanding of the instructions.   They were advised to call back or seek an in-person evaluation if the symptoms worsen or if the condition fails to improve as anticipated.  [name], LCSWA

## 2024-01-12 ENCOUNTER — Emergency Department (HOSPITAL_BASED_OUTPATIENT_CLINIC_OR_DEPARTMENT_OTHER)

## 2024-01-12 ENCOUNTER — Emergency Department (HOSPITAL_BASED_OUTPATIENT_CLINIC_OR_DEPARTMENT_OTHER)
Admission: EM | Admit: 2024-01-12 | Discharge: 2024-01-12 | Disposition: A | Discharge status: Home / Self Care | Attending: Emergency Medicine | Admitting: Emergency Medicine

## 2024-01-12 DIAGNOSIS — R1013 Epigastric pain: Secondary | ICD-10-CM | POA: Insufficient documentation

## 2024-01-12 DIAGNOSIS — R1011 Right upper quadrant pain: Secondary | ICD-10-CM | POA: Insufficient documentation

## 2024-01-12 DIAGNOSIS — R1012 Left upper quadrant pain: Secondary | ICD-10-CM | POA: Insufficient documentation

## 2024-01-12 DIAGNOSIS — R197 Diarrhea, unspecified: Secondary | ICD-10-CM | POA: Insufficient documentation

## 2024-01-12 DIAGNOSIS — R112 Nausea with vomiting, unspecified: Secondary | ICD-10-CM | POA: Diagnosis present

## 2024-01-12 LAB — COMPREHENSIVE METABOLIC PANEL WITH GFR
ALT: 9 U/L (ref 0–44)
AST: 23 U/L (ref 15–41)
Albumin: 4.5 g/dL (ref 3.5–5.0)
Alkaline Phosphatase: 56 U/L (ref 38–126)
Anion gap: 13 (ref 5–15)
BUN: 9 mg/dL (ref 6–20)
CO2: 21 mmol/L — ABNORMAL LOW (ref 22–32)
Calcium: 9.8 mg/dL (ref 8.9–10.3)
Chloride: 104 mmol/L (ref 98–111)
Creatinine, Ser: 0.97 mg/dL (ref 0.44–1.00)
GFR, Estimated: >60 mL/min (ref >60)
Glucose, Bld: 111 mg/dL — ABNORMAL HIGH (ref 70–99)
Potassium: 3.9 mmol/L (ref 3.5–5.1)
Sodium: 138 mmol/L (ref 135–145)
Total Bilirubin: 0.7 mg/dL (ref 0.0–1.2)
Total Protein: 7.7 g/dL (ref 6.5–8.1)

## 2024-01-12 LAB — LIPASE, BLOOD: Lipase: 40 U/L (ref 11–51)

## 2024-01-12 LAB — URINALYSIS, ROUTINE W REFLEX MICROSCOPIC
Bilirubin Urine: NEGATIVE
Glucose, UA: NEGATIVE mg/dL
Ketones, ur: 15 mg/dL — AB
Leukocytes,Ua: NEGATIVE
Nitrite: NEGATIVE
Specific Gravity, Urine: 1.032 — ABNORMAL HIGH (ref 1.005–1.030)
pH: 5.5 (ref 5.0–8.0)

## 2024-01-12 LAB — CBC WITH DIFFERENTIAL/PLATELET
Abs Immature Granulocytes: 0.01 K/uL (ref 0.00–0.07)
Basophils Absolute: 0 K/uL (ref 0.0–0.1)
Basophils Relative: 0 %
Eosinophils Absolute: 0.1 K/uL (ref 0.0–0.5)
Eosinophils Relative: 1 %
HCT: 40.6 % (ref 36.0–46.0)
Hemoglobin: 13.1 g/dL (ref 12.0–15.0)
Immature Granulocytes: 0 %
Lymphocytes Relative: 13 %
Lymphs Abs: 0.8 K/uL (ref 0.7–4.0)
MCH: 30.4 pg (ref 26.0–34.0)
MCHC: 32.3 g/dL (ref 30.0–36.0)
MCV: 94.2 fL (ref 80.0–100.0)
Monocytes Absolute: 0.5 K/uL (ref 0.1–1.0)
Monocytes Relative: 9 %
Neutro Abs: 4.5 K/uL (ref 1.7–7.7)
Neutrophils Relative %: 77 %
Platelets: 402 K/uL — ABNORMAL HIGH (ref 150–400)
RBC: 4.31 MIL/uL (ref 3.87–5.11)
RDW: 11.9 % (ref 11.5–15.5)
WBC: 5.8 K/uL (ref 4.0–10.5)
nRBC: 0 % (ref 0.0–0.2)

## 2024-01-12 LAB — HCG, SERUM, QUALITATIVE: Preg, Serum: NEGATIVE

## 2024-01-12 MED ORDER — IOHEXOL 300 MG/ML  SOLN
100.0000 mL | Freq: Once | INTRAMUSCULAR | Status: AC | PRN
  Administered 2024-01-12: 100 mL via INTRAVENOUS

## 2024-01-12 MED ORDER — FAMOTIDINE 20 MG PO TABS: 20.0000 mg | ORAL_TABLET | Freq: Two times a day (BID) | ORAL | 0 refills | Status: AC

## 2024-01-12 MED ORDER — PANTOPRAZOLE SODIUM 40 MG IV SOLR
40.0000 mg | Freq: Once | INTRAVENOUS | Status: AC
  Administered 2024-01-12: 40 mg via INTRAVENOUS
  Filled 2024-01-12: qty 10

## 2024-01-12 MED ORDER — SODIUM CHLORIDE 0.9 % IV BOLUS
1000.0000 mL | Freq: Once | INTRAVENOUS | Status: AC
Start: 2024-01-12 — End: 2024-01-12
  Administered 2024-01-12: 1000 mL via INTRAVENOUS

## 2024-01-12 MED ORDER — ONDANSETRON 4 MG PO TBDP: 4.0000 mg | ORAL_TABLET | Freq: Three times a day (TID) | ORAL | 0 refills | Status: DC | PRN

## 2024-01-12 MED ORDER — ONDANSETRON HCL 4 MG/2ML IJ SOLN
4.0000 mg | Freq: Once | INTRAMUSCULAR | Status: AC
  Administered 2024-01-12: 4 mg via INTRAVENOUS
  Filled 2024-01-12: qty 2

## 2024-01-12 MED ORDER — MORPHINE SULFATE (PF) 4 MG/ML IV SOLN
4.0000 mg | Freq: Once | INTRAVENOUS | Status: AC
  Administered 2024-01-12: 4 mg via INTRAVENOUS
  Filled 2024-01-12: qty 1

## 2024-01-12 MED ORDER — OMEPRAZOLE 20 MG PO CPDR: 20.0000 mg | DELAYED_RELEASE_CAPSULE | Freq: Every day | ORAL | 0 refills | Status: DC

## 2024-01-12 MED ORDER — ALUM & MAG HYDROXIDE-SIMETH 200-200-20 MG/5ML PO SUSP
30.0000 mL | Freq: Once | ORAL | Status: AC
Start: 2024-01-12 — End: 2024-01-12
  Administered 2024-01-12: 30 mL via ORAL
  Filled 2024-01-12: qty 30

## 2024-01-12 NOTE — ED Triage Notes (Signed)
 C/o n/v/d and abd pain since Sunday. Denies fevers. Endorses constant cramping

## 2024-01-12 NOTE — Discharge Instructions (Addendum)
 Please read and follow all provided instructions.  Your diagnoses today include:  1. Nausea and vomiting, unspecified vomiting type     Tests performed today include: Complete blood cell count: No problems Complete metabolic panel: Normal liver and kidney function Lipase (pancreas function test): Normal pancreas Urinalysis (urine test): You continue to have some blood in the urine, please have this followed up by your primary care doctor Pregnancy test (urine or blood, in women only): Negative CT scan of the abdomen pelvis: Was negative for any acute problems Vital signs. See below for your results today.   Medications prescribed:  Omeprazole  - stomach acid reducer (Take this medication everyday for 3 weeks)  Pepcid  (famotidine ) -stomach acid reducer (Take this medication twice a day for one week)  Zofran  (ondansetron ) - for nausea and vomiting  Home care instructions:  Follow any educational materials contained in this packet.  You should rest for the next several days. Keep drinking plenty of fluids and use the medicine for nausea as directed.   Drink clear liquids for the next 24 hours and introduce solid foods slowly after 24 hours using the b.r.a.t. diet (Bananas, Rice, Applesauce, Toast, Yogurt).    Follow-up instructions: Please follow-up with your primary care provider in the next 2 days for further evaluation of your symptoms. If you are not feeling better in 48 hours you may have a condition that is more serious and you need re-evaluation.   Return instructions:  SEEK IMMEDIATE MEDICAL ATTENTION IF: If you have pain that does not go away or becomes severe  A temperature above 101F develops  Repeated vomiting occurs (multiple episodes)  If you have pain that becomes localized to portions of the abdomen. The right side could possibly be appendicitis. In an adult, the left lower portion of the abdomen could be colitis or diverticulitis.  Blood is being passed in stools or  vomit (bright red or black tarry stools)  You develop chest pain, difficulty breathing, dizziness or fainting, or become confused, poorly responsive, or inconsolable (young children) If you have any other emergent concerns regarding your health  Additional Information: Abdominal (belly) pain can be caused by many things. Your caregiver performed an examination and possibly ordered blood/urine tests and imaging (CT scan, x-rays, ultrasound). Many cases can be observed and treated at home after initial evaluation in the emergency department. Even though you are being discharged home, abdominal pain can be unpredictable. Therefore, you need a repeated exam if your pain does not resolve, returns, or worsens. Most patients with abdominal pain don't have to be admitted to the hospital or have surgery, but serious problems like appendicitis and gallbladder attacks can start out as nonspecific pain. Many abdominal conditions cannot be diagnosed in one visit, so follow-up evaluations are very important.  Your vital signs today were: BP 116/75 (BP Location: Right Arm)   Pulse 65   Temp 98.1 F (36.7 C) (Oral)   Resp 16   SpO2 99%  If your blood pressure (bp) was elevated above 135/85 this visit, please have this repeated by your doctor within one month. --------------

## 2024-01-12 NOTE — ED Notes (Signed)
 DC paperwork given and verbally understood.

## 2024-01-12 NOTE — ED Provider Notes (Signed)
 [place] EMERGENCY DEPARTMENT AT [facility] Provider Note   CSN: [id number] Arrival date & time: 01/12/24  9163     Patient presents with: Abdominal Pain and Emesis   [name] is a 38 y.o. female.   Patient with history of MS on controller medication, history of previous cholecystectomy, tubal ligation/reversal presents to the emergency department today for evaluation of nausea, vomiting, diarrhea and associated abdominal cramping, worse in the upper abdomen, over the past 2 days.  Patient denies any prior sick exposures or suspicious food or water exposures.  Denies hematemesis, hematochezia.  She has been urinating less.  Stool has been watery.  No antibiotic use recently.       Prior to Admission medications   Medication Sig Start Date End Date Taking? Authorizing Provider  acetaminophen  (TYLENOL ) 325 MG tablet Take 650 mg by mouth every 6 (six) hours as needed.    [provider]  albuterol  (VENTOLIN  HFA) 108 (90 Base) MCG/ACT inhaler Inhale 2 puffs into the lungs every 4 (four) hours as needed for wheezing or shortness of breath. Patient not taking: Reported on 12/15/2023 06/12/23   Raford Lenis, MD  drospirenone -ethinyl estradiol  (LORYNA ) 3-0.02 MG tablet Take 1 tablet by mouth daily. Patient not taking: Reported on 12/15/2023 08/12/22   Dawson, Rolitta, CNM  gabapentin  (NEURONTIN ) 400 MG capsule Take 300 mg by mouth 3 (three) times daily. Patient not taking: Reported on 12/15/2023    [provider]  medroxyPROGESTERone  (DEPO-PROVERA ) 150 MG/ML injection Inject 1 mL (150 mg total) into the muscle every 3 (three) months. 12/15/23   Delores Nidia LITTIE, FNP  norelgestromin -ethinyl estradiol  (XULANE) 150-35 MCG/24HR transdermal patch Place 1 patch onto the skin once a week. Patient not taking: Reported on 12/15/2023 04/13/23   [name], MD  ocrelizumab (OCREVUS) 300 MG/10ML injection Inject 300 mg into the vein once. Iv infusion every 6 months for MS     [provider]  ondansetron  (ZOFRAN -ODT) 4 MG disintegrating tablet Take 1 tablet (4 mg total) by mouth every 8 (eight) hours as needed for nausea or vomiting. Patient not taking: Reported on 12/15/2023 11/22/22   [name], MD  oxyCODONE -acetaminophen  (PERCOCET/ROXICET) 5-325 MG tablet Take by mouth every 4 (four) hours as needed for severe pain.    [provider]  predniSONE  (DELTASONE ) 50 MG tablet Take 1 tablet (50 mg total) by mouth daily. Patient not taking: Reported on 12/15/2023 06/12/23   Raford Lenis, MD    Allergies: Patient has no known allergies.    Review of Systems  Updated Vital Signs BP 118/75 (BP Location: Right Arm)   Pulse 87   Temp 98.1 F (36.7 C) (Oral)   Resp 18   SpO2 99%   Physical Exam Vitals and nursing note reviewed.  Constitutional:      General: She is not in acute distress.    Appearance: She is well-developed.  HENT:     Head: Normocephalic and atraumatic.     Right Ear: External ear normal.     Left Ear: External ear normal.     Nose: Nose normal.  Eyes:     Conjunctiva/sclera: Conjunctivae normal.  Cardiovascular:     Rate and Rhythm: Normal rate and regular rhythm.     Heart sounds: No murmur heard. Pulmonary:     Effort: No respiratory distress.     Breath sounds: No wheezing, rhonchi or rales.  Abdominal:     Palpations: Abdomen is soft.     Tenderness:  There is abdominal tenderness in the right upper quadrant, epigastric area and left upper quadrant. There is no guarding or rebound. Negative signs include Murphy's sign and McBurney's sign.  Musculoskeletal:     Cervical back: Normal range of motion and neck supple.     Right lower leg: No edema.     Left lower leg: No edema.  Skin:    General: Skin is warm and dry.     Findings: No rash.  Neurological:     General: No focal deficit present.     Mental Status: She is alert. Mental status is at baseline.     Motor: No weakness.  Psychiatric:        Mood  and Affect: Mood normal.     (all labs ordered are listed, but only abnormal results are displayed) Labs Reviewed  CBC WITH DIFFERENTIAL/PLATELET - Abnormal; Notable for the following components:      Result Value   Platelets 402 (*)    All other components within normal limits  COMPREHENSIVE METABOLIC PANEL WITH GFR - Abnormal; Notable for the following components:   CO2 21 (*)    Glucose, Bld 111 (*)    All other components within normal limits  URINALYSIS, ROUTINE W REFLEX MICROSCOPIC - Abnormal; Notable for the following components:   Specific Gravity, Urine 1.032 (*)    Hgb urine dipstick MODERATE (*)    Ketones, ur 15 (*)    Protein, ur TRACE (*)    Bacteria, UA RARE (*)    All other components within normal limits  LIPASE, BLOOD  HCG, SERUM, QUALITATIVE    EKG: None  Radiology: CT ABDOMEN PELVIS W CONTRAST Result Date: 01/12/2024 CLINICAL DATA:  Epigastric pain.  Nausea vomiting. EXAM: CT ABDOMEN AND PELVIS WITH CONTRAST TECHNIQUE: Multidetector CT imaging of the abdomen and pelvis was performed using the standard protocol following bolus administration of intravenous contrast. RADIATION DOSE REDUCTION: This exam was performed according to the departmental dose-optimization program which includes automated exposure control, adjustment of the mA and/or kV according to patient size and/or use of iterative reconstruction technique. CONTRAST:  OMNIPAQUE  IOHEXOL  300 MG/ML  SOLN COMPARISON:  None Available. FINDINGS: Lower chest: The visualized lung bases are clear. No intra-abdominal free air.  No free fluid. Hepatobiliary: The liver is unremarkable. There is mild biliary dilatation, post cholecystectomy. No retained calcified stone noted in the central Pancreas: Unremarkable. No pancreatic ductal dilatation or surrounding inflammatory changes. Spleen: Normal in size without focal abnormality. Adrenals/Urinary Tract: The adrenal glands are unremarkable. There is no  hydronephrosis on either side. The visualized ureters appear unremarkable. The bladder is collapsed. Stomach/Bowel: There is no bowel obstruction or active inflammation. The appendix is normal. Vascular/Lymphatic: The abdominal aorta and IVC are unremarkable. No portal gas. There is no adenopathy. Reproductive: The uterus is anteverted. No suspicious adnexal masses. Other: There is a 2.5 x 10.1 cm right anterolateral abdominal wall intramuscular lipoma. Musculoskeletal: No acute or significant osseous findings. IMPRESSION: 1. No acute intra-abdominal or pelvic pathology. 2. No bowel obstruction. Normal appendix. 3. A right anterolateral abdominal wall intramuscular lipoma. Electronically Signed   By: Vanetta Chou M.D.   On: 01/12/2024 11:19     Procedures   Medications Ordered in the ED  alum & mag hydroxide-simeth (MAALOX/MYLANTA) 200-200-20 MG/5ML suspension 30 mL (has no administration in time range)  pantoprazole  (PROTONIX ) injection 40 mg (has no administration in time range)  sodium chloride  0.9 % bolus 1,000 mL (0 mLs Intravenous Stopped 01/12/24 1112)  ondansetron  (ZOFRAN ) injection 4 mg (4 mg Intravenous Given 01/12/24 1003)  morphine  (PF) 4 MG/ML injection 4 mg (4 mg Intravenous Given 01/12/24 1005)  iohexol  (OMNIPAQUE ) 300 MG/ML solution 100 mL (100 mLs Intravenous Contrast Given 01/12/24 1110)   ED Course  Patient seen and examined. History obtained directly from patient.   Labs/EKG: Ordered CBC, CMP, lipase, UA, pregnancy.  Imaging: None ordered  Medications/Fluids: Ordered: Morphine , Zofran , IV fluid bolus.   Most recent vital signs reviewed and are as follows: BP 118/75 (BP Location: Right Arm)   Pulse 87   Temp 98.1 F (36.7 C) (Oral)   Resp 18   SpO2 99%   Initial impression: Nausea, vomiting, and diarrhea -- now for 48 hours.  Abdominal exam is overall benign.  No severe pain or focal pain.  11:20 AM Reassessment performed. Patient appears stable.  Continues to  have epigastric pain.  Feels pressure.  Labs personally reviewed and interpreted including: CBC normal white blood cell count and hemoglobin otherwise unremarkable; CMP unremarkable; lipase normal; pregnancy negative.  Pending UA.  Reviewed pertinent lab work and imaging with patient at bedside. Questions answered.   Most current vital signs reviewed and are as follows: BP 115/67 (BP Location: Right Arm)   Pulse 73   Temp 98.1 F (36.7 C) (Oral)   Resp 16   SpO2 100%   Plan: I discussed reassuring lab workup with patient at bedside.  She is high risk due to her MS and medication that she takes to control this.  We discussed symptom control versus additional workup with imaging here today.  After discussion with patient, her preference is to have imaging, for at least reassurance purposes.  I do not feel that this is unreasonable, given continued pain and comorbidities.  CT ordered.  Will give GI cocktail and IV Protonix .  12:26 PM Reassessment performed. Patient appears stable, improved, tolerating fluids without problems.  Labs personally reviewed and interpreted including: UA with red cells, appears chronic.  Reviewed pertinent lab work and imaging with patient at bedside. Questions answered.   Most current vital signs reviewed and are as follows: BP 116/75 (BP Location: Right Arm)   Pulse 65   Temp 98.1 F (36.7 C) (Oral)   Resp 16   SpO2 99%   Plan: Discharge to home.   Prescriptions written for: Omeprazole , Pepcid , Zofran   Other home care instructions discussed: Maintain good hydration, bland diet  ED return instructions discussed: The patient was urged to return to the Emergency Department immediately with worsening of current symptoms, worsening abdominal pain, persistent vomiting, blood noted in stools, fever, or any other concerns. The patient verbalized understanding.   Follow-up instructions discussed: Patient encouraged to follow-up with their PCP in 7 days. Sooner  if not improving.                                    Medical Decision Making Amount and/or Complexity of Data Reviewed Labs: ordered. Radiology: ordered.  Risk OTC drugs. Prescription drug management.   For this patient's complaint of abdominal pain, the following conditions were considered on the differential diagnosis: gastritis/PUD, enteritis/duodenitis, appendicitis, cholelithiasis/cholecystitis, cholangitis, pancreatitis, ruptured viscus, colitis, diverticulitis, small/large bowel obstruction, proctitis, cystitis, pyelonephritis, ureteral colic, aortic dissection, aortic aneurysm. In women, ectopic pregnancy, pelvic inflammatory disease, ovarian cysts, and tubo-ovarian abscess were also considered. Atypical chest etiologies were also considered including ACS, PE, and pneumonia.  Labs and CT were  reassuring.  No sign of infection or signs of perforation.  Symptoms seem most consistent with gastritis at this time.  Symptoms well-controlled here.  The patient's vital signs, pertinent lab work and imaging were reviewed and interpreted as discussed in the ED course. Hospitalization was considered for further testing, treatments, or serial exams/observation. However as patient is well-appearing, has a stable exam, and reassuring studies today, I do not feel that they warrant admission at this time. This plan was discussed with the patient who verbalizes agreement and comfort with this plan and seems reliable and able to return to the Emergency Department with worsening or changing symptoms.       Final diagnoses:  Nausea and vomiting, unspecified vomiting type    ED Discharge Orders          Ordered    famotidine  (PEPCID ) 20 MG tablet  2 times daily        01/12/24 1223    ondansetron  (ZOFRAN -ODT) 4 MG disintegrating tablet  Every 8 hours PRN        01/12/24 1223    omeprazole  (PRILOSEC) 20 MG capsule  Daily        01/12/24 1223               Desiderio Chew,  PA-C 01/12/24 1233    Lenor Hollering, MD 01/13/24 [phone]

## 2024-01-17 ENCOUNTER — Other Ambulatory Visit (HOSPITAL_COMMUNITY)
Admission: RE | Admit: 2024-01-17 | Discharge: 2024-01-17 | Disposition: A | Discharge status: Home / Self Care | Source: Ambulatory Visit | Attending: Obstetrics and Gynecology | Admitting: Obstetrics and Gynecology

## 2024-01-17 ENCOUNTER — Ambulatory Visit: Admitting: Obstetrics and Gynecology

## 2024-01-17 ENCOUNTER — Encounter: Payer: Self-pay | Admitting: Obstetrics and Gynecology

## 2024-01-17 VITALS — BP 120/80 | HR 79 | Ht 64.0 in | Wt 242.2 lb

## 2024-01-17 DIAGNOSIS — Z3202 Encounter for pregnancy test, result negative: Secondary | ICD-10-CM

## 2024-01-17 DIAGNOSIS — N926 Irregular menstruation, unspecified: Secondary | ICD-10-CM | POA: Insufficient documentation

## 2024-01-17 LAB — POCT URINE PREGNANCY: Preg Test, Ur: NEGATIVE

## 2024-01-17 NOTE — Progress Notes (Signed)
 Pt presents to discuss abnormal bleeding. LMP 11/08/23. Has been spotting in between, since.

## 2024-01-17 NOTE — Progress Notes (Signed)
 38 yo [name] here for evaluation of AUB. Patient reports a long standing history of heavy menses associated with dysmenorrhea. She reports her cycles now last 14-15 days. She started depo-provera  a month ago to help manage her cycle but she truly desires a hysterectomy. Patient describes her bleeding as light spotting since depo-provera . Patient denies any pelvic pain or abnormal discharge.   Past Medical History:  Diagnosis Date   Abnormal Pap smear    Colpo>normal   Cholestasis during pregnancy in third trimester 07/12/2020   07/11/20 Bile acids 13.8     Fibroid    Gonorrhea    Headache    last migraine 01/2017   History of IBS    watch diet, no meds   Infection    UTI   Multiple sclerosis (HCC)    Neuromuscular disorder (HCC)    diagnosed with MS this visit   Obesity    Ovarian cyst    Pernicious anemia 05/02/2013   Pregnancy induced hypertension    Preterm labor    all 3 deliveries   SVD (spontaneous vaginal delivery)    x 3   Urinary tract infection    Vaginal Pap smear, abnormal    Past Surgical History:  Procedure Laterality Date   CHOLECYSTECTOMY N/A 11/25/2017   Procedure: LAPAROSCOPIC CHOLECYSTECTOMY WITH INTRAOPERATIVE CHOLANGIOGRAM;  Surgeon: Eletha Boas, MD;  Location: [facility];  Service: General;  Laterality: N/A;   COLPOSCOPY     MYOMECTOMY N/A 04/01/2017   Procedure: Vaginal Myomectomy;  Surgeon: Herchel Gloris LABOR, MD;  Location: [facility];  Service: Gynecology;  Laterality: N/A;   TUBAL LIGATION     2012/ reversal June 2020   WISDOM TOOTH EXTRACTION     Family History  Problem Relation Age of Onset   Diabetes Mother    Hypertension Mother    Diabetes Father    Hypertension Father    Kidney disease Father    Stroke Father    Learning disabilities Brother    Anesthesia problems Neg Hx    Other Neg Hx    Social History   Tobacco Use   Smoking status: Never   Smokeless tobacco: Never  Vaping Use   Vaping status: Never Used  Substance Use Topics    Alcohol use: No    Alcohol/week: 0.0 standard drinks of alcohol    Comment:     Drug use: No    Comment: denies use, states was given CBD gummies for nausea   ROS See pertinent in HPI. All other systems reviewed and non contributory Blood pressure 120/80, pulse 79, height 5' 4 (1.626 m), weight 242 lb 3.2 oz (109.9 kg), last menstrual period 11/08/2023. GENERAL: Well-developed, well-nourished female in no acute distress.  ABDOMEN: Soft, nontender, nondistended. No organomegaly. PELVIC: Normal external female genitalia. Vagina is pink and rugated.  Normal discharge. Normal appearing cervix. Uterus is normal in size. No adnexal mass or tenderness. Chaperone present during the pelvic exam EXTREMITIES: No cyanosis, clubbing, or edema, 2+ distal pulses.  CT ABDOMEN PELVIS W CONTRAST Result Date: 01/12/2024 CLINICAL DATA:  Epigastric pain.  Nausea vomiting. EXAM: CT ABDOMEN AND PELVIS WITH CONTRAST TECHNIQUE: Multidetector CT imaging of the abdomen and pelvis was performed using the standard protocol following bolus administration of intravenous contrast. RADIATION DOSE REDUCTION: This exam was performed according to the departmental dose-optimization program which includes automated exposure control, adjustment of the mA and/or kV according to patient size and/or use of iterative reconstruction technique. CONTRAST:  OMNIPAQUE  IOHEXOL  300 MG/ML  SOLN COMPARISON:  None Available. FINDINGS: Lower chest: The visualized lung bases are clear. No intra-abdominal free air.  No free fluid. Hepatobiliary: The liver is unremarkable. There is mild biliary dilatation, post cholecystectomy. No retained calcified stone noted in the central Pancreas: Unremarkable. No pancreatic ductal dilatation or surrounding inflammatory changes. Spleen: Normal in size without focal abnormality. Adrenals/Urinary Tract: The adrenal glands are unremarkable. There is no hydronephrosis on either side. The visualized ureters appear  unremarkable. The bladder is collapsed. Stomach/Bowel: There is no bowel obstruction or active inflammation. The appendix is normal. Vascular/Lymphatic: The abdominal aorta and IVC are unremarkable. No portal gas. There is no adenopathy. Reproductive: The uterus is anteverted. No suspicious adnexal masses. Other: There is a 2.5 x 10.1 cm right anterolateral abdominal wall intramuscular lipoma. Musculoskeletal: No acute or significant osseous findings. IMPRESSION: 1. No acute intra-abdominal or pelvic pathology. 2. No bowel obstruction. Normal appendix. 3. A right anterolateral abdominal wall intramuscular lipoma. Electronically Signed   By: Vanetta Chou M.D.   On: 01/12/2024 11:19   MM 3D SCREENING MAMMOGRAM BILATERAL BREAST Result Date: 01/10/2024 CLINICAL DATA:  Screening. EXAM: DIGITAL SCREENING BILATERAL MAMMOGRAM WITH TOMOSYNTHESIS AND CAD TECHNIQUE: Bilateral screening digital craniocaudal and mediolateral oblique mammograms were obtained. Bilateral screening digital breast tomosynthesis was performed. The images were evaluated with computer-aided detection. COMPARISON:  Previous exam(s). ACR Breast Density Category a: The breasts are almost entirely fatty. FINDINGS: There are no findings suspicious for malignancy. IMPRESSION: No mammographic evidence of malignancy. A result letter of this screening mammogram will be mailed directly to the patient. RECOMMENDATION: Screening mammogram in one year. (Code:[id number]-B-01Y) BI-RADS CATEGORY  1: Negative. Electronically Signed   By: Dina  Arceo M.D.   On: 01/10/2024 08:11   US  PELVIC COMPLETE WITH TRANSVAGINAL Result Date: 12/25/2023 CLINICAL DATA:  History of cervical fibroid and adenomyosis. Irregular bleeding. EXAM: TRANSABDOMINAL AND TRANSVAGINAL ULTRASOUND OF PELVIS TECHNIQUE: Both transabdominal and transvaginal ultrasound examinations of the pelvis were performed. Transabdominal technique was performed for global imaging of the pelvis including uterus,  ovaries, adnexal regions, and pelvic cul-de-sac. It was necessary to proceed with endovaginal exam following the transabdominal exam to visualize the ovaries and endometrium. COMPARISON:  Choose pelvic ultrasound 02/23/2019 FINDINGS: Uterus Measurements: 9.4 x 3.9 x 6.0 cm = volume: 114 mL. Diffusely coarse heterogeneous echotexture. Masses in the junctional zone present from Endometrium Thickness: 10 mm.  No focal abnormality visualized. Right ovary Measurements: 2.8 x 1.5 x 2.4 cm = volume: 5 mL. Normal appearance/no adnexal mass. Left ovary Measurements: 5.2 x 3.2 x 5.0 cm = volume: Meghan mL. There is a cyst within the left ovary measuring 4.4 x 2.3 x 4.4 cm. This is anechoic. Other findings Small amount of free fluid in the pelvis. IMPRESSION: 1. Diffusely coarse heterogeneous echotexture of the uterus with masses in the junctional zone, consistent with adenomyosis. 2. Left ovarian simple cyst measuring 4.4 cm. No follow up imaging recommended. Note: This recommendation does not apply to premenarchal patients or to those with increased risk (genetic, family history, elevated tumor markers or other high-risk factors) of ovarian cancer. Reference: Radiology 2019 Nov; 293(2):359-371. 3. Small amount of free fluid in the pelvis. Electronically Signed   By: Greig Pique M.D.   On: 12/25/2023 20:32     A/P 38 yo with AUB - reviewed ultrasound findings with the patient - Discussed surgical management with endometrial ablation vs hysterectomy. Patient desires to proceed with hysterectomy. Given size of uterus, patient is being referred to one of my partners  for vaginal hysterectomy consultation - Discussed completing evaluation of AUB with endometrial biopsy ENDOMETRIAL BIOPSY     The indications for endometrial biopsy were reviewed.   Risks of the biopsy including cramping, bleeding, infection, uterine perforation, inadequate specimen and need for additional procedures  were discussed. The patient states she  understands and agrees to undergo procedure today. Consent was signed. Time out was performed. Urine HCG was negative. A sterile speculum was placed in the patient's vagina and the cervix was prepped with Betadine. A single-toothed tenaculum was placed on the anterior lip of the cervix to stabilize it. The uterine cavity was sounded to a depth of 9 cm using the uterine sound. The 3 mm pipelle was introduced into the endometrial cavity without difficulty, 2passes were made.  A  moderate amount of tissue was  sent to pathology. The instruments were removed from the patient's vagina. Minimal bleeding from the cervix was noted. The patient tolerated the procedure well.  Routine post-procedure instructions were given to the patient. The patient will follow up in two weeks to review the results and for further management.

## 2024-01-17 NOTE — Addendum Note (Signed)
 Addended by: ALGER GONG on: 01/17/2024 03:11 PM   Modules accepted: Orders

## 2024-01-17 NOTE — Addendum Note (Signed)
 Addended by: JERRYE AREA D on: 01/17/2024 03:08 PM   Modules accepted: Orders

## 2024-01-19 LAB — SURGICAL PATHOLOGY

## 2024-01-20 ENCOUNTER — Ambulatory Visit: Payer: Self-pay | Admitting: Obstetrics and Gynecology

## 2024-02-16 ENCOUNTER — Ambulatory Visit: Admitting: Obstetrics & Gynecology

## 2024-02-16 ENCOUNTER — Encounter: Payer: Self-pay | Admitting: Obstetrics & Gynecology

## 2024-02-16 VITALS — BP 147/89 | HR 78 | Ht 64.0 in | Wt 243.0 lb

## 2024-02-16 DIAGNOSIS — N938 Other specified abnormal uterine and vaginal bleeding: Secondary | ICD-10-CM

## 2024-02-16 NOTE — Progress Notes (Signed)
 Pt presents for hysterectomy consult. 147

## 2024-02-16 NOTE — Progress Notes (Signed)
 38 yo [id number] here for evaluation of AUB. Patient reports a long standing history of heavy menses associated with dysmenorrhea. She reports her cycles now last 14-15 days. She started depo-provera  a month ago to help manage her cycle but she truly desires a hysterectomy. Patient describes her bleeding as light spotting since depo-provera . Patient denies any pelvic pain or abnormal discharge.     reviewed ultrasound findings and Bx result with the patient - Discussed surgical management with endometrial ablation vs hysterectomy. Patient desires to proceed with hysterectomy. Given size of uterus, patient is is here to schedule surgical management with [facility].  The risks of surgery were discussed in detail with the patient including but not limited to: bleeding which may require transfusion or reoperation; infection which may require prolonged hospitalization or re-hospitalization and antibiotic therapy; injury to bowel, bladder, ureters and major vessels or other surrounding organs which may lead to other procedures; formation of adhesions; need for additional procedures including laparotomy or subsequent procedures secondary to intraoperative injury or abnormal pathology; thromboembolic phenomenon; incisional problems and other postoperative or anesthesia complications.  Patient was told that the likelihood that her condition and symptoms will be treated effectively with this surgical management was very high; the postoperative expectations were also discussed in detail. The patient also understands the alternative treatment options which were discussed in full. All questions were answered.  She was told that she will be contacted by our surgical scheduler regarding the time and date of her surgery; routine preoperative instructions will be given to her by the preoperative nursing team.   Printed patient education handouts about the procedure were given to the patient to review at home. 15 min face to face and  non face to face time  [name], MD 02/16/2024

## 2024-03-07 ENCOUNTER — Other Ambulatory Visit: Payer: Self-pay

## 2024-03-07 ENCOUNTER — Encounter: Attending: Obstetrics and Gynecology | Admitting: Physical Therapy

## 2024-03-07 ENCOUNTER — Encounter: Payer: Self-pay | Admitting: Physical Therapy

## 2024-03-07 DIAGNOSIS — N941 Unspecified dyspareunia: Secondary | ICD-10-CM | POA: Diagnosis not present

## 2024-03-07 DIAGNOSIS — R102 Pelvic and perineal pain: Secondary | ICD-10-CM | POA: Insufficient documentation

## 2024-03-07 DIAGNOSIS — N393 Stress incontinence (female) (male): Secondary | ICD-10-CM | POA: Insufficient documentation

## 2024-03-07 DIAGNOSIS — M6281 Muscle weakness (generalized): Secondary | ICD-10-CM | POA: Diagnosis present

## 2024-03-07 NOTE — Patient Instructions (Signed)
 Moisturizers They are used in the vagina to hydrate the mucous membrane that make up the vaginal canal. Designed to keep a more normal acid balance (ph) Once placed in the vagina, it will last between two to three days.  Use 2-3 times per week at bedtime  Ingredients to avoid is glycerin  and fragrance, can increase chance of infection Should not be used just before sex due to causing irritation Most are gels administered either in a tampon-shaped applicator or as a vaginal suppository. They are non-hormonal.   Types of Moisturizers(internal use)  Vitamin E vaginal suppositories- Whole foods, Amazon Moist Again Julva- (Do no use if taking  Tamoxifen) amazon Yes moisturizer- amazon NeuEve Silk , NeuEve Silver  for menopausal or over 65 (if have severe vaginal atrophy or cancer treatments use NeuEve Silk for  1 month than move to [employer] Silver )- [employer], Flat.com Olive and Bee intimate cream- [email or web address].com.au Mae vaginal moisturizer- Amazon Aloe Good Clean Love Hyaluronic acid Hyalofemme Reveree hyaluronic acid inserts   Creams to use externally on the Vulva area Marathon Oil (good for for cancer patients that had radiation to the area)- guam or [employer].[email or web address]://garcia-[email or web address]/ Vulva Balm/ V-magic cream by medicine mama- amazon Julva-amazon Vital V Wild Yam salve ( help moisturize and help with thinning vulvar area, does have Beeswax MoodMaid Botanical Pro-Meno Wild Yam Cream- Amazon Desert Harvest Gele Cleo by Sherrlyn labial moisturizer (Amazon),  aloe Good Clean Love Enchanted Rose by intimate rose  Things to avoid in the vaginal area Do not use things to irritate the vulvar area No lotions just specialized creams for the vulva area- Neogyn, V-magic,  No soaps; can use Aveeno or Calendula cleanser, unscented Dove if needed. Must be gentle No deodorants No douches Good to sleep without underwear to let the vaginal area to air out No scrubbing: spread the lips to let  warm water rinse over labias and pat dry   Channing Pereyra, PT [facility] [address], [address] [place], [place] [address] W: [phone] [email or web address]

## 2024-03-07 NOTE — Therapy (Signed)
 OUTPATIENT PHYSICAL THERAPY FEMALE PELVIC EVALUATION   Patient Name: [name] MRN: [id number] DOB:1986-04-04, 38 y.o., female Today's Date: 03/07/2024  END OF SESSION:  PT End of Session - 03/07/24 1400     Visit Number 1    Date for Recertification  05/30/24    Authorization Type [employer] medicare/medicaid    Authorization - Visit Number 1    Authorization - Number of Visits 10    PT Start Time 1400    PT Stop Time 1445    PT Time Calculation (min) 45 min    Activity Tolerance Patient tolerated treatment well    Behavior During Therapy [facility] for tasks assessed/performed          Past Medical History:  Diagnosis Date   Abnormal Pap smear    Colpo>normal   Cholestasis during pregnancy in third trimester 07/12/2020   07/11/20 Bile acids 13.8     Fibroid    Gonorrhea    Headache    last migraine 01/2017   History of IBS    watch diet, no meds   Infection    UTI   Multiple sclerosis    Neuromuscular disorder (HCC)    diagnosed with MS this visit   Obesity    Ovarian cyst    Pernicious anemia 05/02/2013   Pregnancy induced hypertension    Preterm labor    all 3 deliveries   SVD (spontaneous vaginal delivery)    x 3   Urinary tract infection    Vaginal Pap smear, abnormal    Past Surgical History:  Procedure Laterality Date   CHOLECYSTECTOMY N/A 11/25/2017   Procedure: LAPAROSCOPIC CHOLECYSTECTOMY WITH INTRAOPERATIVE CHOLANGIOGRAM;  Surgeon: Eletha Boas, MD;  Location: [facility];  Service: General;  Laterality: N/A;   COLPOSCOPY     MYOMECTOMY N/A 04/01/2017   Procedure: Vaginal Myomectomy;  Surgeon: Herchel Gloris LABOR, MD;  Location: [facility];  Service: Gynecology;  Laterality: N/A;   TUBAL LIGATION     2012/ reversal June 2020   WISDOM TOOTH EXTRACTION     Patient Active Problem List   Diagnosis Date Noted   DUB (dysfunctional uterine bleeding) 02/16/2024   Hyperglycemia 07/22/2020   Maternal morbid obesity, antepartum (HCC) 07/11/2020   BMI 40.0-44.9, adult  (HCC) 07/11/2020   Hx of preeclampsia, prior pregnancy, currently pregnant 05/23/2020   History of reversal of tubal ligation 12/20/2019   Adenomyosis 05/09/2019   Anxiety 11/28/2018   Endometriosis 05/23/2018   Fibroid of cervix    Cervical mass 12/23/2016   Child behavior problem 03/27/2016   Malabsorption 08/29/2013   Acute relapsing multiple sclerosis (HCC) 07/22/2013   B12 deficiency 06/27/2013   Insomnia 06/27/2013   Adjustment disorder with mixed anxiety and depressed mood 03/13/2013   Multiple sclerosis affecting pregnancy in third trimester 03/13/2013    PCP: Shelda Atlas, MD  REFERRING PROVIDER: Delores Nidia LITTIE, FNP    REFERRING DIAG:  N94.10 (ICD-10-CM) - Dyspareunia in female  N39.3 (ICD-10-CM) - Stress incontinence    THERAPY DIAG:  Muscle weakness (generalized) - Plan: PT plan of care cert/re-cert  Pelvic pain - Plan: PT plan of care cert/re-cert  Rationale for Evaluation and Treatment: Rehabilitation  ONSET DATE: 2012  SUBJECTIVE:  SUBJECTIVE STATEMENT: Patient had twins 08/2010. Had them vaginally. Pain with intercourse since she had a fibroid on her cervix.  Fluid intake: water, juice, soda  FUNCTIONAL LIMITATIONS: someday's has to use the walker depending on her MS  PERTINENT HISTORY:  Surgeries: cholecystectomy; Myomectomy; Multiple sclerosis; Neuromuscular disorder  Sexual abuse: No   PAIN:  Are you having pain? Yes NPRS scale: 7/10 Pain location: Vaginal  Pain type: sharp Pain description: intermittent   Aggravating factors: vaginal penetration Relieving factors: no vaginal penetration  PRECAUTIONS: Other: multiple sclerosis  RED FLAGS: None   WEIGHT BEARING RESTRICTIONS: No  FALLS:  Has patient fallen in last 6 months? No  OCCUPATION:  disability  ACTIVITY LEVEL : walking  PLOF: Independent  PATIENT GOALS: reduce leakage and pain with intercourse   BOWEL MOVEMENT: No issues   URINATION: Pain with urination: No Fully empty bladder: No feels like she has to urinate again                     Post-void dribble: Yes  Stream: Strong Urgency: Yes  Frequency:during the day every 3-4 hours                                                         Nocturia: No1 time   Leakage: Coughing, Sneezing, and Laughing Pads/briefs: Yes: 1 per day  INTERCOURSE:  Ability to have vaginal penetration Yes  Pain with intercourse: Deep Penetration Dryness: Yes  Climax: no Marinoff Scale: 1/3 Lubricant:: just starting  PREGNANCY: Vaginal deliveries 5 Tearing No  PROLAPSE: None   OBJECTIVE:  Note: Objective measures were completed at Evaluation unless otherwise noted.  DIAGNOSTIC FINDINGS:  one  PATIENT SURVEYS:  PFIQ-7: 50 UIQ-7: 48  COGNITION: Overall cognitive status: Within functional limits for tasks assessed     SENSATION: Light touch: Appears intact   POSTURE: rounded shoulders, forward head, and increased lumbar lordosis   LUMBARAROM/PROM:  A/PROM A/PROM  Eval (% available)  Extension 50  Left lateral flexion 75   (Blank rows = not tested)  LOWER EXTREMITY ROM: Bi lateal hip ROM is full   LOWER EXTREMITY MMT:  MMT Right eval Left eval  Hip extension 5/5 4/5  Hip abduction 5/5 3+/5   (Blank rows = not tested) PALPATION:  Pelvic Alignment: ASIS are equal  Abdominal: tenderness located throughout the abdomen  Diastasis: Yes: 1 finger width above and below the umbilicus with good tension Distortion: No  Breathing: chest breather, difficulty with opening the lower rib cage                 External Perineal Exam: good coloring of the vulva                             Internal Pelvic Floor: tightness on the sides of the introitus, pain in the iliococcygeus  Patient confirms  identification and approves PT to assess internal pelvic floor and treatment Yes No emotional/communication barriers or cognitive limitation. Patient is motivated to learn. Patient understands and agrees with treatment goals and plan. PT explains patient will be examined in standing, sitting, and lying down to see how their muscles and joints work. When they are ready, they will be asked to remove their underwear so PT can examine  their perineum. The patient is also given the option of providing their own chaperone as one is not provided in our facility. The patient also has the right and is explained the right to defer or refuse any part of the evaluation or treatment including the internal exam. With the patient's consent, PT will use one gloved finger to gently assess the muscles of the pelvic floor, seeing how well it contracts and relaxes and if there is muscle symmetry. After, the patient will get dressed and PT and patient will discuss exam findings and plan of care. PT and patient discuss plan of care, schedule, attendance policy and HEP activities.   PELVIC MMT:   MMT eval  Vaginal 3/5 posteriorly; other areas 2/5; hold 2 sec  (Blank rows = not tested)        TONE:    PROLAPSE: Anterior and posterior wall weakness just above the introitus  TODAY'S TREATMENT:                                                                                                                              DATE: 03/07/24  EVAL Examination completed, findings reviewed, pt educated on POC, HEP, and female pelvic floor anatomy, reasoning with pelvic floor assessment internally with pt consent. Pt motivated to participate in PT and agreeable to attempt recommendations.     PATIENT EDUCATION:  03/07/24 Education details: educated patient on vaginal moisturizers, how to apply and vaginal health; educated patient on how to massage the sides of the introitus to relax the muscles Person educated: Patient Education  method: Explanation, Demonstration, Tactile cues, Verbal cues, and Handouts Education comprehension: verbalized understanding, returned demonstration, verbal cues required, tactile cues required, and needs further education  HOME EXERCISE PROGRAM: See above  ASSESSMENT:  CLINICAL IMPRESSION: Patient is a 38 y.o. female who was seen today for physical therapy evaluation and treatment for stress incontinence and dyspareunia . Patient has a history of multiple Sclerosis and is waiting for a date to have her hysterectomy. Patient reports her urinary leakage started after her last child and pain with intercourse started 5 years ago. Patient will leak urine with coughing, sneezing, and laughing. She will have to urinate multiple times. She will dribble urine after urinating. She has pain with deep vaginal penetration by the iliococcygeus. Anterior and posterior wall weakness that is above the introitus. Pelvic floor strength is 2/5 with the exception of posteriorly is 3/5 hiding for 2 seconds. She has tenderness located throughout the abdominals and difficulty with expanding her lower abdomen. Patient will benefit from skilled therapy to work on reducing urinary leakage and pelvic pain.   OBJECTIVE IMPAIRMENTS: decreased activity tolerance, decreased coordination, decreased strength, increased fascial restrictions, increased muscle spasms, and pain.   ACTIVITY LIMITATIONS: continence and toileting  PARTICIPATION LIMITATIONS: interpersonal relationship, shopping, and community activity  PERSONAL FACTORS: cholecystectomy; Myomectomy; Multiple sclerosis; Neuromuscular disorder are also affecting patient's functional outcome.   REHAB POTENTIAL: Excellent  CLINICAL  DECISION MAKING: Evolving/moderate complexity  EVALUATION COMPLEXITY: Moderate   GOALS: Goals reviewed with patient? Yes  SHORT TERM GOALS: Target date: 04/04/24  Patient independent with initial HEP for flexibility and diaphragmatic  breathing.  Baseline: Goal status: INITIAL  2.  Patient educated on how to massage the introitus to work on tissue mobility.  Baseline:  Goal status: INITIAL  3.  Patient educated on vaginal health and vaginal moisturizers.  Baseline:  Goal status: INITIAL  4.  Patient educated on urge to void technique.  Baseline:  Goal status: INITIAL   LONG TERM GOALS: Target date: 05/30/24  Patient independent with advanced HEP for core and pelvic floor strength to reduce her urinary leakage.  Baseline:  Goal status: INITIAL  2.  Patient is able to have penile penetration vaginally with 0-1/10 pain due to the reduction of trigger points in the muscles.  Baseline:  Goal status: INITIAL  3.  Patient pelvic floor strength is >/= 3/5 holding 10 seconds to reduce her urinary leakage with coughing, sneezing and laughing >/= 50%.  Baseline:  Goal status: INITIAL  4.  Patient understands how to have a bowel movement correctly to reduce strain on pelvic floor after a hysterectomy.  Baseline:  Goal status: INITIAL   PLAN:  PT FREQUENCY: 1-2x/week  PT DURATION: 12 weeks  PLANNED INTERVENTIONS: 97110-Therapeutic exercises, 97530- Therapeutic activity, 97112- Neuromuscular re-education, 97535- Self Care, [id number]- Manual therapy, Patient/Family education, Joint mobilization, Spinal mobilization, and Biofeedback  PLAN FOR NEXT SESSION: manual work to abdomen, diaphragmatic breathing, manual work to the [employer], PT 03/07/24 2:58 PM

## 2024-03-08 ENCOUNTER — Other Ambulatory Visit: Payer: Self-pay

## 2024-03-08 ENCOUNTER — Encounter (HOSPITAL_BASED_OUTPATIENT_CLINIC_OR_DEPARTMENT_OTHER): Payer: Self-pay

## 2024-03-08 ENCOUNTER — Emergency Department (HOSPITAL_BASED_OUTPATIENT_CLINIC_OR_DEPARTMENT_OTHER)
Admission: EM | Admit: 2024-03-08 | Discharge: 2024-03-09 | Disposition: A | Discharge status: Home / Self Care | Attending: Emergency Medicine | Admitting: Emergency Medicine

## 2024-03-08 DIAGNOSIS — R1084 Generalized abdominal pain: Secondary | ICD-10-CM | POA: Insufficient documentation

## 2024-03-08 DIAGNOSIS — R109 Unspecified abdominal pain: Secondary | ICD-10-CM | POA: Diagnosis present

## 2024-03-08 MED ORDER — SODIUM CHLORIDE 0.9 % IV BOLUS
1000.0000 mL | Freq: Once | INTRAVENOUS | Status: AC
  Administered 2024-03-09: 1000 mL via INTRAVENOUS

## 2024-03-08 MED ORDER — ONDANSETRON HCL 4 MG/2ML IJ SOLN
4.0000 mg | Freq: Once | INTRAMUSCULAR | Status: AC
Start: 2024-03-09 — End: 2024-03-09
  Administered 2024-03-09: 4 mg via INTRAVENOUS
  Filled 2024-03-08: qty 2

## 2024-03-08 MED ORDER — MORPHINE SULFATE (PF) 4 MG/ML IV SOLN
4.0000 mg | Freq: Once | INTRAVENOUS | Status: AC
  Administered 2024-03-09: 4 mg via INTRAVENOUS
  Filled 2024-03-08: qty 1

## 2024-03-08 NOTE — ED Provider Notes (Signed)
 [place] [facility]ERGENCY DEPARTMENT AT [facility] Provider Note   CSN: [id number] Arrival date & time: 03/08/24  2239     Patient presents with: Abdominal Pain   GLORISTINE TURRUBIATES is a 38 y.o. female.  {Add pertinent medical, surgical, social history, OB history to YEP:67052} Presents to the emergency department for evaluation of abdominal pain.  Patient reports that the pain began suddenly last night.  She reports a sharp pain around the umbilicus region.  She has felt bloated, took Gas-X and a Percocet without relief.  Feels possibly constipated, no nausea, vomiting or diarrhea.       Prior to Admission medications   Medication Sig Start Date End Date Taking? Authorizing Provider  acetaminophen  (TYLENOL ) 325 MG tablet Take 650 mg by mouth every 6 (six) hours as needed.    [provider]  atorvastatin (LIPITOR) 40 MG tablet Take 40 mg by mouth at bedtime. 11/24/23   [provider]  cyanocobalamin  (VITAMIN B12) 1000 MCG/ML injection Inject 1,000 mcg into the skin every 30 (thirty) days.    [provider]  famotidine  (PEPCID ) 20 MG tablet Take 1 tablet (20 mg total) by mouth 2 (two) times daily. 01/12/24   Desiderio Chew, PA-C  medroxyPROGESTERone  (DEPO-PROVERA ) 150 MG/ML injection Inject 1 mL (150 mg total) into the muscle every 3 (three) months. 12/15/23   Delores Nidia LITTIE, FNP  ocrelizumab (OCREVUS) 300 MG/10ML injection Inject 300 mg into the vein once. Iv infusion every 6 months for MS    [provider]  omeprazole  (PRILOSEC) 20 MG capsule Take 1 capsule (20 mg total) by mouth daily. 01/12/24   Desiderio Chew, PA-C  ondansetron  (ZOFRAN -ODT) 4 MG disintegrating tablet Take 1 tablet (4 mg total) by mouth every 8 (eight) hours as needed for nausea or vomiting. Patient not taking: Reported on 02/16/2024 01/12/24   Geiple, Joshua, PA-C  oxyCODONE -acetaminophen  (PERCOCET/ROXICET) 5-325 MG tablet Take by mouth every 4 (four) hours as needed for severe  pain.    [provider]  Vitamin D , Ergocalciferol , (DRISDOL ) 1.25 MG (50000 UNIT) CAPS capsule Take 50,000 Units by mouth once a week.    [provider]    Allergies: Patient has no known allergies.    Review of Systems  Updated Vital Signs BP (!) 157/97 (BP Location: Right Arm)   Pulse 87   Temp 98.8 F (37.1 C) (Oral)   Resp 16   Ht 5' 4 (1.626 m)   Wt 110 kg   LMP 02/28/2024 (Approximate)   SpO2 99%   BMI 41.63 kg/m   Physical Exam Vitals and nursing note reviewed.  Constitutional:      General: She is not in acute distress.    Appearance: She is well-developed.  HENT:     Head: Normocephalic and atraumatic.     Mouth/Throat:     Mouth: Mucous membranes are moist.  Eyes:     General: Vision grossly intact. Gaze aligned appropriately.     Extraocular Movements: Extraocular movements intact.     Conjunctiva/sclera: Conjunctivae normal.  Cardiovascular:     Rate and Rhythm: Normal rate and regular rhythm.     Pulses: Normal pulses.     Heart sounds: Normal heart sounds, S1 normal and S2 normal. No murmur heard.    No friction rub. No gallop.  Pulmonary:     Effort: Pulmonary effort is normal. No respiratory distress.     Breath sounds: Normal breath sounds.  Abdominal:     General: Bowel sounds  are normal.     Palpations: Abdomen is soft.     Tenderness: There is abdominal tenderness in the periumbilical area. There is no guarding or rebound.     Hernia: No hernia is present.  Musculoskeletal:        General: No swelling.     Cervical back: Full passive range of motion without pain, normal range of motion and neck supple. No spinous process tenderness or muscular tenderness. Normal range of motion.     Right lower leg: No edema.     Left lower leg: No edema.  Skin:    General: Skin is warm and dry.     Capillary Refill: Capillary refill takes less than 2 seconds.     Findings: No ecchymosis, erythema, rash or wound.  Neurological:      General: No focal deficit present.     Mental Status: She is alert and oriented to person, place, and time.     GCS: GCS eye subscore is 4. GCS verbal subscore is 5. GCS motor subscore is 6.     Cranial Nerves: Cranial nerves 2-12 are intact.     Sensory: Sensation is intact.     Motor: Motor function is intact.     Coordination: Coordination is intact.  Psychiatric:        Attention and Perception: Attention normal.        Mood and Affect: Mood normal.        Speech: Speech normal.        Behavior: Behavior normal.     (all labs ordered are listed, but only abnormal results are displayed) Labs Reviewed - No data to display  EKG: None  Radiology: No results found.  {Document cardiac monitor, telemetry assessment procedure when appropriate:32947} Procedures   Medications Ordered in the ED  sodium chloride  0.9 % bolus 1,000 mL (has no administration in time range)  morphine  (PF) 4 MG/ML injection 4 mg (has no administration in time range)  ondansetron  (ZOFRAN ) injection 4 mg (has no administration in time range)      {Click here for ABCD2, HEART and other calculators REFRESH Note before signing:1}                              Medical Decision Making Amount and/or Complexity of Data Reviewed Labs: ordered.  Risk Prescription drug management.   ***  {Document critical care time when appropriate  Document review of labs and clinical decision tools ie CHADS2VASC2, etc  Document your independent review of radiology images and any outside records  Document your discussion with family members, caretakers and with consultants  Document social determinants of health affecting pt's care  Document your decision making why or why not admission, treatments were needed:32947:::1}   Final diagnoses:  None    ED Discharge Orders     None

## 2024-03-08 NOTE — ED Triage Notes (Signed)
 Abdominal pain started last night Has taken gas X and percocet with no relief -N/V Possibly constipated

## 2024-03-09 ENCOUNTER — Emergency Department (HOSPITAL_BASED_OUTPATIENT_CLINIC_OR_DEPARTMENT_OTHER)

## 2024-03-09 DIAGNOSIS — R1084 Generalized abdominal pain: Secondary | ICD-10-CM | POA: Diagnosis not present

## 2024-03-09 LAB — URINALYSIS, ROUTINE W REFLEX MICROSCOPIC
Bacteria, UA: NONE SEEN
Bilirubin Urine: NEGATIVE
Glucose, UA: NEGATIVE mg/dL
Ketones, ur: NEGATIVE mg/dL
Leukocytes,Ua: NEGATIVE
Nitrite: NEGATIVE
Protein, ur: NEGATIVE mg/dL
Specific Gravity, Urine: 1.022 (ref 1.005–1.030)
pH: 5.5 (ref 5.0–8.0)

## 2024-03-09 LAB — CBC WITH DIFFERENTIAL/PLATELET
Abs Immature Granulocytes: 0.02 K/uL (ref 0.00–0.07)
Basophils Absolute: 0 K/uL (ref 0.0–0.1)
Basophils Relative: 0 %
Eosinophils Absolute: 0.1 K/uL (ref 0.0–0.5)
Eosinophils Relative: 2 %
HCT: 31.7 % — ABNORMAL LOW (ref 36.0–46.0)
Hemoglobin: 10.3 g/dL — ABNORMAL LOW (ref 12.0–15.0)
Immature Granulocytes: 0 %
Lymphocytes Relative: 34 %
Lymphs Abs: 2.3 K/uL (ref 0.7–4.0)
MCH: 31.4 pg (ref 26.0–34.0)
MCHC: 32.5 g/dL (ref 30.0–36.0)
MCV: 96.6 fL (ref 80.0–100.0)
Monocytes Absolute: 0.8 K/uL (ref 0.1–1.0)
Monocytes Relative: 11 %
Neutro Abs: 3.6 K/uL (ref 1.7–7.7)
Neutrophils Relative %: 53 %
Platelets: 401 K/uL — ABNORMAL HIGH (ref 150–400)
RBC: 3.28 MIL/uL — ABNORMAL LOW (ref 3.87–5.11)
RDW: 12.3 % (ref 11.5–15.5)
WBC: 6.8 K/uL (ref 4.0–10.5)
nRBC: 0 % (ref 0.0–0.2)

## 2024-03-09 LAB — COMPREHENSIVE METABOLIC PANEL WITH GFR
ALT: 50 U/L — ABNORMAL HIGH (ref 0–44)
AST: 44 U/L — ABNORMAL HIGH (ref 15–41)
Albumin: 4.1 g/dL (ref 3.5–5.0)
Alkaline Phosphatase: 54 U/L (ref 38–126)
Anion gap: 12 (ref 5–15)
BUN: 9 mg/dL (ref 6–20)
CO2: 24 mmol/L (ref 22–32)
Calcium: 9.3 mg/dL (ref 8.9–10.3)
Chloride: 103 mmol/L (ref 98–111)
Creatinine, Ser: 0.91 mg/dL (ref 0.44–1.00)
GFR, Estimated: >60 mL/min (ref >60)
Glucose, Bld: 91 mg/dL (ref 70–99)
Potassium: 3.3 mmol/L — ABNORMAL LOW (ref 3.5–5.1)
Sodium: 139 mmol/L (ref 135–145)
Total Bilirubin: 0.4 mg/dL (ref 0.0–1.2)
Total Protein: 6.5 g/dL (ref 6.5–8.1)

## 2024-03-09 LAB — LIPASE, BLOOD: Lipase: 27 U/L (ref 11–51)

## 2024-03-09 LAB — HCG, SERUM, QUALITATIVE: Preg, Serum: NEGATIVE

## 2024-03-09 MED ORDER — IOHEXOL 300 MG/ML  SOLN
100.0000 mL | Freq: Once | INTRAMUSCULAR | Status: AC | PRN
  Administered 2024-03-09: 100 mL via INTRAVENOUS

## 2024-03-09 MED ORDER — DICYCLOMINE HCL 20 MG PO TABS: 20.0000 mg | ORAL_TABLET | Freq: Three times a day (TID) | ORAL | 0 refills | Status: DC

## 2024-03-14 ENCOUNTER — Encounter: Admitting: Physical Therapy

## 2024-03-14 ENCOUNTER — Encounter: Payer: Self-pay | Admitting: Physical Therapy

## 2024-03-14 DIAGNOSIS — R102 Pelvic and perineal pain unspecified side: Secondary | ICD-10-CM

## 2024-03-14 DIAGNOSIS — M6281 Muscle weakness (generalized): Secondary | ICD-10-CM

## 2024-03-14 NOTE — Patient Instructions (Signed)

## 2024-03-14 NOTE — Therapy (Signed)
 OUTPATIENT PHYSICAL THERAPY FEMALE PELVIC TREATMENT   Patient Name: [name] MRN: [id number] DOB:09-05-1985, 38 y.o., female Today's Date: 03/14/2024  END OF SESSION:  PT End of Session - 03/14/24 1504     Visit Number 2    Date for Recertification  05/30/24    Authorization Type [employer] medicare/medicaid    Authorization - Visit Number 2    Authorization - Number of Visits 10    PT Start Time 1500    PT Stop Time 1545    PT Time Calculation (min) 45 min    Activity Tolerance Patient tolerated treatment well    Behavior During Therapy [facility] for tasks assessed/performed          Past Medical History:  Diagnosis Date   Abnormal Pap smear    Colpo>normal   Cholestasis during pregnancy in third trimester 07/12/2020   07/11/20 Bile acids 13.8     Fibroid    Gonorrhea    Headache    last migraine 01/2017   History of IBS    watch diet, no meds   Infection    UTI   Multiple sclerosis    Neuromuscular disorder (HCC)    diagnosed with MS this visit   Obesity    Ovarian cyst    Pernicious anemia 05/02/2013   Pregnancy induced hypertension    Preterm labor    all 3 deliveries   SVD (spontaneous vaginal delivery)    x 3   Urinary tract infection    Vaginal Pap smear, abnormal    Past Surgical History:  Procedure Laterality Date   CHOLECYSTECTOMY N/A 11/25/2017   Procedure: LAPAROSCOPIC CHOLECYSTECTOMY WITH INTRAOPERATIVE CHOLANGIOGRAM;  Surgeon: Eletha Boas, MD;  Location: [facility];  Service: General;  Laterality: N/A;   COLPOSCOPY     MYOMECTOMY N/A 04/01/2017   Procedure: Vaginal Myomectomy;  Surgeon: Herchel Gloris LABOR, MD;  Location: [facility];  Service: Gynecology;  Laterality: N/A;   TUBAL LIGATION     2012/ reversal June 2020   WISDOM TOOTH EXTRACTION     Patient Active Problem List   Diagnosis Date Noted   DUB (dysfunctional uterine bleeding) 02/16/2024   Hyperglycemia 07/22/2020   Maternal morbid obesity, antepartum (HCC) 07/11/2020   BMI 40.0-44.9, adult  (HCC) 07/11/2020   Hx of preeclampsia, prior pregnancy, currently pregnant 05/23/2020   History of reversal of tubal ligation 12/20/2019   Adenomyosis 05/09/2019   Anxiety 11/28/2018   Endometriosis 05/23/2018   Fibroid of cervix    Cervical mass 12/23/2016   Child behavior problem 03/27/2016   Malabsorption 08/29/2013   Acute relapsing multiple sclerosis (HCC) 07/22/2013   B12 deficiency 06/27/2013   Insomnia 06/27/2013   Adjustment disorder with mixed anxiety and depressed mood 03/13/2013   Multiple sclerosis affecting pregnancy in third trimester 03/13/2013    PCP: Shelda Atlas, MD  REFERRING PROVIDER: Delores Nidia LITTIE, FNP    REFERRING DIAG:  N94.10 (ICD-10-CM) - Dyspareunia in female  N39.3 (ICD-10-CM) - Stress incontinence    THERAPY DIAG:  Muscle weakness (generalized)  Pelvic pain  Rationale for Evaluation and Treatment: Rehabilitation  ONSET DATE: 2012  SUBJECTIVE:  SUBJECTIVE STATEMENT: I went to the ER the next day after therapy due to stomach pain.   Fluid intake: water, juice, soda  FUNCTIONAL LIMITATIONS: someday's has to use the walker depending on her MS  PERTINENT HISTORY:  Surgeries: cholecystectomy; Myomectomy; Multiple sclerosis; Neuromuscular disorder  Sexual abuse: No   PAIN:  Are you having pain? Yes NPRS scale: 7/10 Pain location: Vaginal  Pain type: sharp Pain description: intermittent   Aggravating factors: vaginal penetration Relieving factors: no vaginal penetration  PRECAUTIONS: Other: multiple sclerosis  RED FLAGS: None   WEIGHT BEARING RESTRICTIONS: No  FALLS:  Has patient fallen in last 6 months? No  OCCUPATION: disability  ACTIVITY LEVEL : walking  PLOF: Independent  PATIENT GOALS: reduce leakage and pain with  intercourse   BOWEL MOVEMENT: No issues   URINATION: Pain with urination: No Fully empty bladder: No feels like she has to urinate again                     Post-void dribble: Yes  Stream: Strong Urgency: Yes  Frequency:during the day every 3-4 hours                                                         Nocturia: No1 time   Leakage: Coughing, Sneezing, and Laughing Pads/briefs: Yes: 1 per day  INTERCOURSE:  Ability to have vaginal penetration Yes  Pain with intercourse: Deep Penetration Dryness: Yes  Climax: no Marinoff Scale: 1/3 Lubricant:: just starting  PREGNANCY: Vaginal deliveries 5 Tearing No  PROLAPSE: None   OBJECTIVE:  Note: Objective measures were completed at Evaluation unless otherwise noted.  DIAGNOSTIC FINDINGS:  one  PATIENT SURVEYS:  PFIQ-7: 50 UIQ-7: 48  COGNITION: Overall cognitive status: Within functional limits for tasks assessed     SENSATION: Light touch: Appears intact   POSTURE: rounded shoulders, forward head, and increased lumbar lordosis   LUMBARAROM/PROM:  A/PROM A/PROM  Eval (% available)  Extension 50  Left lateral flexion 75   (Blank rows = not tested)  LOWER EXTREMITY ROM: Bi lateal hip ROM is full   LOWER EXTREMITY MMT:  MMT Right eval Left eval  Hip extension 5/5 4/5  Hip abduction 5/5 3+/5   (Blank rows = not tested) PALPATION:  Pelvic Alignment: ASIS are equal  Abdominal: tenderness located throughout the abdomen  Diastasis: Yes: 1 finger width above and below the umbilicus with good tension Distortion: No  Breathing: chest breather, difficulty with opening the lower rib cage                 External Perineal Exam: good coloring of the vulva                             Internal Pelvic Floor: tightness on the sides of the introitus, pain in the iliococcygeus  Patient confirms identification and approves PT to assess internal pelvic floor and treatment Yes No emotional/communication barriers or  cognitive limitation. Patient is motivated to learn. Patient understands and agrees with treatment goals and plan. PT explains patient will be examined in standing, sitting, and lying down to see how their muscles and joints work. When they are ready, they will be asked to remove their underwear so PT can examine their perineum. The  patient is also given the option of providing their own chaperone as one is not provided in our facility. The patient also has the right and is explained the right to defer or refuse any part of the evaluation or treatment including the internal exam. With the patient's consent, PT will use one gloved finger to gently assess the muscles of the pelvic floor, seeing how well it contracts and relaxes and if there is muscle symmetry. After, the patient will get dressed and PT and patient will discuss exam findings and plan of care. PT and patient discuss plan of care, schedule, attendance policy and HEP activities.   PELVIC MMT:   MMT eval  Vaginal 3/5 posteriorly; other areas 2/5; hold 2 sec  (Blank rows = not tested)        TONE:    PROLAPSE: Anterior and posterior wall weakness just above the introitus  TODAY'S TREATMENT:        03/14/24 Manual: Soft tissue mobilization: Circular massage to the abdomen to promote peristalic motion of the intestines and educated patient on how to perform at home Manual work to bilateral hip adductors to lengthen the muscles Myofascial release: Fascial release of the lower abdomen especially above the pubic bone to reduce restrictions around the bladder and reduce abdominal pain Neuromuscular re-education: Down training: Diaphragmatic breathing in sitting with tactile cues on the rib cage to assist with expansion Therapeutic activities: Functional strengthening activities: Educated patient on how to sit on the commode with knees above hips, diaphragmatic breathing to relax the pelvic floor, how to generate pressure to push the  stool out and patient was abel to return demonstration Self-care: Reviewed with patient on vaginal moisturizers and how the assist with vaginal health                                                                                                                          DATE: 03/07/24  EVAL Examination completed, findings reviewed, pt educated on POC, HEP, and female pelvic floor anatomy, reasoning with pelvic floor assessment internally with pt consent. Pt motivated to participate in PT and agreeable to attempt recommendations.     PATIENT EDUCATION:  03/14/24 Education details: Access Code: [email or web address] Person educated: Patient Education method: Explanation, Demonstration, Tactile cues, Verbal cues, and Handouts Education comprehension: verbalized understanding, returned demonstration, verbal cues required, tactile cues required, and needs further education  HOME EXERCISE PROGRAM: 03/14/24 Access Code: [email or web address] URL: [email or web address] Date: 03/14/2024 Prepared by: Channing Pereyra  Exercises - Seated Diaphragmatic Breathing  - 2 x daily - 7 x weekly - 1 sets - 10 reps  Patient Education - Abdominal Massage for Constipation   ASSESSMENT:  CLINICAL IMPRESSION: Patient is a 38 y.o. female who was seen today for physical therapy  treatment for stress incontinence and dyspareunia . Patient was able to feel her pelvic floor relax with diaphragmatic breathing. She had not lower abdominal pain after the manual work and the area was softer.  Patient understands  about vaginal moisturizers. She was able to feel the rectum relax with the correct bowel technique. Patient will benefit from skilled therapy to work on reducing urinary leakage and pelvic pain.   OBJECTIVE IMPAIRMENTS: decreased activity tolerance, decreased coordination, decreased strength, increased fascial restrictions, increased muscle spasms, and pain.   ACTIVITY LIMITATIONS: continence and toileting  PARTICIPATION  LIMITATIONS: interpersonal relationship, shopping, and community activity  PERSONAL FACTORS: cholecystectomy; Myomectomy; Multiple sclerosis; Neuromuscular disorder are also affecting patient's functional outcome.   REHAB POTENTIAL: Excellent  CLINICAL DECISION MAKING: Evolving/moderate complexity  EVALUATION COMPLEXITY: Moderate   GOALS: Goals reviewed with patient? Yes  SHORT TERM GOALS: Target date: 04/04/24  Patient independent with initial HEP for flexibility and diaphragmatic breathing.  Baseline: Goal status: INITIAL  2.  Patient educated on how to massage the introitus to work on tissue mobility.  Baseline:  Goal status: INITIAL  3.  Patient educated on vaginal health and vaginal moisturizers.  Baseline:  Goal status: Met 03/14/24  4.  Patient educated on urge to void technique.  Baseline:  Goal status: INITIAL   LONG TERM GOALS: Target date: 05/30/24  Patient independent with advanced HEP for core and pelvic floor strength to reduce her urinary leakage.  Baseline:  Goal status: INITIAL  2.  Patient is able to have penile penetration vaginally with 0-1/10 pain due to the reduction of trigger points in the muscles.  Baseline:  Goal status: INITIAL  3.  Patient pelvic floor strength is >/= 3/5 holding 10 seconds to reduce her urinary leakage with coughing, sneezing and laughing >/= 50%.  Baseline:  Goal status: INITIAL  4.  Patient understands how to have a bowel movement correctly to reduce strain on pelvic floor after a hysterectomy.  Baseline:  Goal status: INITIAL   PLAN:  PT FREQUENCY: 1-2x/week  PT DURATION: 12 weeks  PLANNED INTERVENTIONS: 97110-Therapeutic exercises, 97530- Therapeutic activity, 97112- Neuromuscular re-education, 97535- Self Care, [id number]- Manual therapy, Patient/Family education, Joint mobilization, Spinal mobilization, and Biofeedback  PLAN FOR NEXT SESSION:  manual work to the iliococcygeus, urge to void, see if she has a  surgery date, hip adductor stretch, core engagement   Channing Pereyra, PT 03/14/24 4:01 PM

## 2024-03-21 ENCOUNTER — Encounter: Attending: Obstetrics and Gynecology | Admitting: Physical Therapy

## 2024-03-28 ENCOUNTER — Encounter: Attending: Obstetrics and Gynecology | Admitting: Physical Therapy

## 2024-03-28 ENCOUNTER — Encounter: Payer: Self-pay | Admitting: Physical Therapy

## 2024-03-28 DIAGNOSIS — M6281 Muscle weakness (generalized): Secondary | ICD-10-CM | POA: Diagnosis present

## 2024-03-28 DIAGNOSIS — R102 Pelvic and perineal pain unspecified side: Secondary | ICD-10-CM | POA: Insufficient documentation

## 2024-03-28 NOTE — Therapy (Signed)
 OUTPATIENT PHYSICAL THERAPY FEMALE PELVIC TREATMENT   Patient Name: [name] MRN: [id number] DOB:1985-10-14, 38 y.o., female Today's Date: 03/28/2024  END OF SESSION:  PT End of Session - 03/28/24 1601     Visit Number 3    Date for Recertification  05/30/24    Authorization Type [employer] medicare/medicaid    Authorization - Visit Number 3    Authorization - Number of Visits 10    PT Start Time 1600    PT Stop Time 1645    PT Time Calculation (min) 45 min    Activity Tolerance Patient tolerated treatment well    Behavior During Therapy [facility] for tasks assessed/performed          Past Medical History:  Diagnosis Date   Abnormal Pap smear    Colpo>normal   Cholestasis during pregnancy in third trimester 07/12/2020   07/11/20 Bile acids 13.8     Fibroid    Gonorrhea    Headache    last migraine 01/2017   History of IBS    watch diet, no meds   Infection    UTI   Multiple sclerosis    Neuromuscular disorder (HCC)    diagnosed with MS this visit   Obesity    Ovarian cyst    Pernicious anemia 05/02/2013   Pregnancy induced hypertension    Preterm labor    all 3 deliveries   SVD (spontaneous vaginal delivery)    x 3   Urinary tract infection    Vaginal Pap smear, abnormal    Past Surgical History:  Procedure Laterality Date   CHOLECYSTECTOMY N/A 11/25/2017   Procedure: LAPAROSCOPIC CHOLECYSTECTOMY WITH INTRAOPERATIVE CHOLANGIOGRAM;  Surgeon: Eletha Boas, MD;  Location: [facility];  Service: General;  Laterality: N/A;   COLPOSCOPY     MYOMECTOMY N/A 04/01/2017   Procedure: Vaginal Myomectomy;  Surgeon: Herchel Gloris LABOR, MD;  Location: [facility];  Service: Gynecology;  Laterality: N/A;   TUBAL LIGATION     2012/ reversal June 2020   WISDOM TOOTH EXTRACTION     Patient Active Problem List   Diagnosis Date Noted   DUB (dysfunctional uterine bleeding) 02/16/2024   Hyperglycemia 07/22/2020   Maternal morbid obesity, antepartum (HCC) 07/11/2020   BMI 40.0-44.9, adult  (HCC) 07/11/2020   Hx of preeclampsia, prior pregnancy, currently pregnant 05/23/2020   History of reversal of tubal ligation 12/20/2019   Adenomyosis 05/09/2019   Anxiety 11/28/2018   Endometriosis 05/23/2018   Fibroid of cervix    Cervical mass 12/23/2016   Child behavior problem 03/27/2016   Malabsorption 08/29/2013   Acute relapsing multiple sclerosis (HCC) 07/22/2013   B12 deficiency 06/27/2013   Insomnia 06/27/2013   Adjustment disorder with mixed anxiety and depressed mood 03/13/2013   Multiple sclerosis affecting pregnancy in third trimester 03/13/2013    PCP: Shelda Atlas, MD  REFERRING PROVIDER: Delores Nidia LITTIE, FNP    REFERRING DIAG:  N94.10 (ICD-10-CM) - Dyspareunia in female  N39.3 (ICD-10-CM) - Stress incontinence    THERAPY DIAG:  Muscle weakness (generalized)  Pelvic pain  Rationale for Evaluation and Treatment: Rehabilitation  ONSET DATE: 2012  SUBJECTIVE:  SUBJECTIVE STATEMENT: The toileting is helping.   Fluid intake: water, juice, soda  FUNCTIONAL LIMITATIONS: someday's has to use the walker depending on her MS  PERTINENT HISTORY:  Surgeries: cholecystectomy; Myomectomy; Multiple sclerosis; Neuromuscular disorder  Sexual abuse: No   PAIN:  Are you having pain? Yes NPRS scale: 7/10 03/28/24: pain level 5/10 Pain location: Vaginal  Pain type: sharp Pain description: intermittent   Aggravating factors: vaginal penetration Relieving factors: no vaginal penetration  PRECAUTIONS: Other: multiple sclerosis  RED FLAGS: None   WEIGHT BEARING RESTRICTIONS: No  FALLS:  Has patient fallen in last 6 months? No  OCCUPATION: disability  ACTIVITY LEVEL : walking  PLOF: Independent  PATIENT GOALS: reduce leakage and pain with intercourse   BOWEL  MOVEMENT: No issues   URINATION: Pain with urination: No Fully empty bladder: No feels like she has to urinate again                     Post-void dribble: Yes  Stream: Strong Urgency: Yes  Frequency:during the day every 3-4 hours                                                         Nocturia: No1 time   Leakage: Coughing, Sneezing, and Laughing Pads/briefs: Yes: 1 per day  INTERCOURSE:  Ability to have vaginal penetration Yes  Pain with intercourse: Deep Penetration Dryness: Yes  Climax: no Marinoff Scale: 1/3 Lubricant:: just starting  PREGNANCY: Vaginal deliveries 5 Tearing No  PROLAPSE: None   OBJECTIVE:  Note: Objective measures were completed at Evaluation unless otherwise noted.  DIAGNOSTIC FINDINGS:  one  PATIENT SURVEYS:  PFIQ-7: 50 UIQ-7: 48  COGNITION: Overall cognitive status: Within functional limits for tasks assessed     SENSATION: Light touch: Appears intact   POSTURE: rounded shoulders, forward head, and increased lumbar lordosis   LUMBARAROM/PROM:  A/PROM A/PROM  Eval (% available)  Extension 50  Left lateral flexion 75   (Blank rows = not tested)  LOWER EXTREMITY ROM: Bi lateal hip ROM is full   LOWER EXTREMITY MMT:  MMT Right eval Left eval  Hip extension 5/5 4/5  Hip abduction 5/5 3+/5   (Blank rows = not tested) PALPATION:  Pelvic Alignment: ASIS are equal  Abdominal: tenderness located throughout the abdomen  Diastasis: Yes: 1 finger width above and below the umbilicus with good tension Distortion: No  Breathing: chest breather, difficulty with opening the lower rib cage                 External Perineal Exam: good coloring of the vulva                             Internal Pelvic Floor: tightness on the sides of the introitus, pain in the iliococcygeus  Patient confirms identification and approves PT to assess internal pelvic floor and treatment Yes No emotional/communication barriers or cognitive limitation.  Patient is motivated to learn. Patient understands and agrees with treatment goals and plan. PT explains patient will be examined in standing, sitting, and lying down to see how their muscles and joints work. When they are ready, they will be asked to remove their underwear so PT can examine their perineum. The patient is also given the option  of providing their own chaperone as one is not provided in our facility. The patient also has the right and is explained the right to defer or refuse any part of the evaluation or treatment including the internal exam. With the patient's consent, PT will use one gloved finger to gently assess the muscles of the pelvic floor, seeing how well it contracts and relaxes and if there is muscle symmetry. After, the patient will get dressed and PT and patient will discuss exam findings and plan of care. PT and patient discuss plan of care, schedule, attendance policy and HEP activities.   PELVIC MMT:   MMT eval 03/28/24  Vaginal 3/5 posteriorly; other areas 2/5; hold 2 sec 4/5 after manual work  (Blank rows = not tested)        TONE:    PROLAPSE: Anterior and posterior wall weakness just above the introitus  TODAY'S TREATMENT:   03/28/24 Manual: Soft tissue mobilization: Manual work along the levator ani, ischiocavernosus, and perineal body externally to elongate the tissue Scar tissue mobilization: Manual work to the lower abdominal scar to elongate the tissue Internal pelvic floor techniques: No emotional/communication barriers or cognitive limitation. Patient is motivated to learn. Patient understands and agrees with treatment goals and plan. PT explains patient will be examined in standing, sitting, and lying down to see how their muscles and joints work. When they are ready, they will be asked to remove their underwear so PT can examine their perineum. The patient is also given the option of providing their own chaperone as one is not provided in our  facility. The patient also has the right and is explained the right to defer or refuse any part of the evaluation or treatment including the internal exam. With the patient's consent, PT will use one gloved finger to gently assess the muscles of the pelvic floor, seeing how well it contracts and relaxes and if there is muscle symmetry. After, the patient will get dressed and PT and patient will discuss exam findings and plan of care. PT and patient discuss plan of care, schedule, attendance policy and HEP activities.  Therapist gloved finger in the vaginal canal working on the introitus, urethra sphincter, iliococcygeus, obturator internist and posterior vaginal canal to elongate the tissue and reduce the trigger points.  Neuromuscular re-education: Pelvic floor contraction training: Therapist gloved finger in the vaginal canal working on pelvic floor contraction while breathing out and not holding her breath Down training: Educated patient on the urge to void technique and she was able to return demonstration         03/14/24 Manual: Soft tissue mobilization: Circular massage to the abdomen to promote peristalic motion of the intestines and educated patient on how to perform at home Manual work to bilateral hip adductors to lengthen the muscles Myofascial release: Fascial release of the lower abdomen especially above the pubic bone to reduce restrictions around the bladder and reduce abdominal pain Neuromuscular re-education: Down training: Diaphragmatic breathing in sitting with tactile cues on the rib cage to assist with expansion Therapeutic activities: Functional strengthening activities: Educated patient on how to sit on the commode with knees above hips, diaphragmatic breathing to relax the pelvic floor, how to generate pressure to push the stool out and patient was abel to return demonstration Self-care: Reviewed with patient on vaginal moisturizers and how the assist with vaginal  health  DATE: 03/07/24  EVAL Examination completed, findings reviewed, pt educated on POC, HEP, and female pelvic floor anatomy, reasoning with pelvic floor assessment internally with pt consent. Pt motivated to participate in PT and agreeable to attempt recommendations.     PATIENT EDUCATION:  03/28/24 Education details: Access Code: [email or web address], education on urge to void technique Person educated: Patient Education method: Explanation, Demonstration, Tactile cues, Verbal cues, and Handouts Education comprehension: verbalized understanding, returned demonstration, verbal cues required, tactile cues required, and needs further education  HOME EXERCISE PROGRAM: 03/14/24 Access Code: [email or web address] URL: [email or web address] Date: 03/14/2024 Prepared by: Channing Pereyra  Exercises - Seated Diaphragmatic Breathing  - 2 x daily - 7 x weekly - 1 sets - 10 reps  Patient Education - Abdominal Massage for Constipation   ASSESSMENT:  CLINICAL IMPRESSION: Patient is a 38 y.o. female who was seen today for physical therapy  treatment for stress incontinence and dyspareunia . Patient reports her pain level is 5/10 with vaginal penetration compared to 7/10.  No urinary leakage since last visit. Pelvic floor strength increased to 4/5. She had several trigger points in the pelvic floor muscles that were resolved. She continues to have some restrictions around the lower abdominal scar. Patient will benefit from skilled therapy to work on reducing urinary leakage and pelvic pain.   OBJECTIVE IMPAIRMENTS: decreased activity tolerance, decreased coordination, decreased strength, increased fascial restrictions, increased muscle spasms, and pain.   ACTIVITY LIMITATIONS: continence and toileting  PARTICIPATION LIMITATIONS: interpersonal relationship, shopping, and community  activity  PERSONAL FACTORS: cholecystectomy; Myomectomy; Multiple sclerosis; Neuromuscular disorder are also affecting patient's functional outcome.   REHAB POTENTIAL: Excellent  CLINICAL DECISION MAKING: Evolving/moderate complexity  EVALUATION COMPLEXITY: Moderate   GOALS: Goals reviewed with patient? Yes  SHORT TERM GOALS: Target date: 04/04/24  Patient independent with initial HEP for flexibility and diaphragmatic breathing.  Baseline: Goal status: INITIAL  2.  Patient educated on how to massage the introitus to work on tissue mobility.  Baseline:  Goal status: Met 03/28/24  3.  Patient educated on vaginal health and vaginal moisturizers.  Baseline:  Goal status: Met 03/14/24  4.  Patient educated on urge to void technique.  Baseline:  Goal status: Met 03/28/24   LONG TERM GOALS: Target date: 05/30/24  Patient independent with advanced HEP for core and pelvic floor strength to reduce her urinary leakage.  Baseline:  Goal status: INITIAL  2.  Patient is able to have penile penetration vaginally with 0-1/10 pain due to the reduction of trigger points in the muscles.  Baseline:  Goal status: INITIAL  3.  Patient pelvic floor strength is >/= 3/5 holding 10 seconds to reduce her urinary leakage with coughing, sneezing and laughing >/= 50%.  Baseline:  Goal status: INITIAL  4.  Patient understands how to have a bowel movement correctly to reduce strain on pelvic floor after a hysterectomy.  Baseline:  Goal status: INITIAL   PLAN:  PT FREQUENCY: 1-2x/week  PT DURATION: 12 weeks  PLANNED INTERVENTIONS: 97110-Therapeutic exercises, 97530- Therapeutic activity, 97112- Neuromuscular re-education, 97535- Self Care, [id number]- Manual therapy, Patient/Family education, Joint mobilization, Spinal mobilization, and Biofeedback  PLAN FOR NEXT SESSION:  see how the  urge to void,  hip adductor stretch, core engagement   Channing Pereyra, PT 03/28/24 4:48 PM

## 2024-03-28 NOTE — Patient Instructions (Signed)
 Urge Incontinence  Ideal urination frequency is every 2-4 wakeful hours, which equates to 5-8 times within a 24-hour period.   Urge incontinence is leakage that occurs when the bladder muscle contracts, creating a sudden need to go before getting to the bathroom.   Going too often when your bladder isn't actually full can disrupt the body's automatic signals to store and hold urine longer, which will increase urgency/frequency.  In this case, the bladder "is running the show" and strategies can be learned to retrain this pattern.   One should be able to control the first urge to urinate, at around .  The bladder can hold up to a "grande latte," or . To help you gain control, practice the Urge Drill below when urgency strikes.  This drill will help retrain your bladder signals and allow you to store and hold urine longer.  The overall goal is to stretch out your time between voids to reach a more manageable voiding schedule.    Practice your quick flicks often throughout the day (each waking hour) even when you don't need feel the urge to go.  This will help strengthen your pelvic floor muscles, making them more effective in controlling leakage.  Urge Drill  When you feel an urge to go, follow these steps to regain control: Stop what you are doing and be still Take one deep breath, directing your air into your abdomen Think an affirming thought, such as "I've got this." Do 5 quick flicks of your pelvic floor Place heel on the ground hard going up and down the toes Walk with control to the bathroom to void, or delay voiding If you get the urge again repeat again    [name], PT [facility] [address], [address] [place], [place] [address] W: [phone] [name]

## 2024-04-04 ENCOUNTER — Encounter: Payer: Self-pay | Admitting: Physical Therapy

## 2024-04-04 DIAGNOSIS — R102 Pelvic and perineal pain unspecified side: Secondary | ICD-10-CM

## 2024-04-04 DIAGNOSIS — M6281 Muscle weakness (generalized): Secondary | ICD-10-CM

## 2024-04-04 NOTE — Therapy (Signed)
 OUTPATIENT PHYSICAL THERAPY FEMALE PELVIC TREATMENT   Patient Name: [name] MRN: [id number] DOB:Oct 28, 1985, 38 y.o., female Today's Date: 04/04/2024  END OF SESSION:  PT End of Session - 04/04/24 0939     Visit Number 4    Date for Recertification  05/30/24    Authorization Type [employer] medicare/medicaid    Authorization - Visit Number 4    Authorization - Number of Visits 10    PT Start Time 0930    PT Stop Time 1015    PT Time Calculation (min) 45 min    Activity Tolerance Patient tolerated treatment well    Behavior During Therapy [facility] for tasks assessed/performed          Past Medical History:  Diagnosis Date   Abnormal Pap smear    Colpo>normal   Cholestasis during pregnancy in third trimester 07/12/2020   07/11/20 Bile acids 13.8     Fibroid    Gonorrhea    Headache    last migraine 01/2017   History of IBS    watch diet, no meds   Infection    UTI   Multiple sclerosis    Neuromuscular disorder (HCC)    diagnosed with MS this visit   Obesity    Ovarian cyst    Pernicious anemia 05/02/2013   Pregnancy induced hypertension    Preterm labor    all 3 deliveries   SVD (spontaneous vaginal delivery)    x 3   Urinary tract infection    Vaginal Pap smear, abnormal    Past Surgical History:  Procedure Laterality Date   CHOLECYSTECTOMY N/A 11/25/2017   Procedure: LAPAROSCOPIC CHOLECYSTECTOMY WITH INTRAOPERATIVE CHOLANGIOGRAM;  Surgeon: Eletha Boas, MD;  Location: [facility];  Service: General;  Laterality: N/A;   COLPOSCOPY     MYOMECTOMY N/A 04/01/2017   Procedure: Vaginal Myomectomy;  Surgeon: Herchel Gloris LABOR, MD;  Location: [facility];  Service: Gynecology;  Laterality: N/A;   TUBAL LIGATION     2012/ reversal June 2020   WISDOM TOOTH EXTRACTION     Patient Active Problem List   Diagnosis Date Noted   DUB (dysfunctional uterine bleeding) 02/16/2024   Hyperglycemia 07/22/2020   Maternal morbid obesity, antepartum (HCC) 07/11/2020   BMI 40.0-44.9, adult  (HCC) 07/11/2020   Hx of preeclampsia, prior pregnancy, currently pregnant 05/23/2020   History of reversal of tubal ligation 12/20/2019   Adenomyosis 05/09/2019   Anxiety 11/28/2018   Endometriosis 05/23/2018   Fibroid of cervix    Cervical mass 12/23/2016   Child behavior problem 03/27/2016   Malabsorption 08/29/2013   Acute relapsing multiple sclerosis (HCC) 07/22/2013   B12 deficiency 06/27/2013   Insomnia 06/27/2013   Adjustment disorder with mixed anxiety and depressed mood 03/13/2013   Multiple sclerosis affecting pregnancy in third trimester 03/13/2013    PCP: Shelda Atlas, MD  REFERRING PROVIDER: Delores Nidia LITTIE, FNP    REFERRING DIAG:  N94.10 (ICD-10-CM) - Dyspareunia in female  N39.3 (ICD-10-CM) - Stress incontinence    THERAPY DIAG:  Muscle weakness (generalized)  Pelvic pain  Rationale for Evaluation and Treatment: Rehabilitation  ONSET DATE: 2012  SUBJECTIVE:  SUBJECTIVE STATEMENT: The toileting is helping.   Fluid intake: water, juice, soda  FUNCTIONAL LIMITATIONS: someday's has to use the walker depending on her MS  PERTINENT HISTORY:  Surgeries: cholecystectomy; Myomectomy; Multiple sclerosis; Neuromuscular disorder  Sexual abuse: No   PAIN:  Are you having pain? Yes NPRS scale: 7/10 03/28/24: pain level 5/10 Pain location: Vaginal  Pain type: sharp Pain description: intermittent   Aggravating factors: vaginal penetration Relieving factors: no vaginal penetration  PRECAUTIONS: Other: multiple sclerosis  RED FLAGS: None   WEIGHT BEARING RESTRICTIONS: No  FALLS:  Has patient fallen in last 6 months? No  OCCUPATION: disability  ACTIVITY LEVEL : walking  PLOF: Independent  PATIENT GOALS: reduce leakage and pain with intercourse   BOWEL  MOVEMENT: No issues   URINATION: Pain with urination: No Fully empty bladder: No feels like she has to urinate again                     Post-void dribble: Yes  Stream: Strong Urgency: Yes  Frequency:during the day every 3-4 hours                                                         Nocturia: No1 time   Leakage: Coughing, Sneezing, and Laughing Pads/briefs: Yes: 1 per day  INTERCOURSE:  Ability to have vaginal penetration Yes  Pain with intercourse: Deep Penetration Dryness: Yes  Climax: no Marinoff Scale: 1/3 Lubricant:: just starting  PREGNANCY: Vaginal deliveries 5 Tearing No  PROLAPSE: None   OBJECTIVE:  Note: Objective measures were completed at Evaluation unless otherwise noted.  DIAGNOSTIC FINDINGS:  one  PATIENT SURVEYS:  PFIQ-7: 50 UIQ-7: 48  COGNITION: Overall cognitive status: Within functional limits for tasks assessed     SENSATION: Light touch: Appears intact   POSTURE: rounded shoulders, forward head, and increased lumbar lordosis   LUMBARAROM/PROM:  A/PROM A/PROM  Eval (% available)  Extension 50  Left lateral flexion 75   (Blank rows = not tested)  LOWER EXTREMITY ROM: Bi lateal hip ROM is full   LOWER EXTREMITY MMT:  MMT Right eval Left eval  Hip extension 5/5 4/5  Hip abduction 5/5 3+/5   (Blank rows = not tested) PALPATION:  Pelvic Alignment: ASIS are equal  Abdominal: tenderness located throughout the abdomen  Diastasis: Yes: 1 finger width above and below the umbilicus with good tension Distortion: No  Breathing: chest breather, difficulty with opening the lower rib cage                 External Perineal Exam: good coloring of the vulva                             Internal Pelvic Floor: tightness on the sides of the introitus, pain in the iliococcygeus  Patient confirms identification and approves PT to assess internal pelvic floor and treatment Yes No emotional/communication barriers or cognitive limitation.  Patient is motivated to learn. Patient understands and agrees with treatment goals and plan. PT explains patient will be examined in standing, sitting, and lying down to see how their muscles and joints work. When they are ready, they will be asked to remove their underwear so PT can examine their perineum. The patient is also given the option  of providing their own chaperone as one is not provided in our facility. The patient also has the right and is explained the right to defer or refuse any part of the evaluation or treatment including the internal exam. With the patient's consent, PT will use one gloved finger to gently assess the muscles of the pelvic floor, seeing how well it contracts and relaxes and if there is muscle symmetry. After, the patient will get dressed and PT and patient will discuss exam findings and plan of care. PT and patient discuss plan of care, schedule, attendance policy and HEP activities.   PELVIC MMT:   MMT eval 03/28/24  Vaginal 3/5 posteriorly; other areas 2/5; hold 2 sec 4/5 after manual work  (Blank rows = not tested)        TONE:    PROLAPSE: Anterior and posterior wall weakness just above the introitus  TODAY'S TREATMENT:   04/04/24 Manual: Scar tissue mobilization: Manual work around the lower abdominal scar working through restrictions and mobility Myofascial release: Fascial release along the pubovesical ligament and urachus ligament Fascial release above the pubic bone Neuromuscular re-education: Core retraining: Transverse abdominus contraction 10 x Supine alternate hip flexion with core engaged 20 x Supine alternate shoulder flexion with red band 20 x and core engaged Bridge with bilateral shoulder flexion with red band 20 x     03/28/24 Manual: Soft tissue mobilization: Manual work along the levator ani, ischiocavernosus, and perineal body externally to elongate the tissue Scar tissue mobilization: Manual work to the lower abdominal  scar to elongate the tissue Internal pelvic floor techniques: No emotional/communication barriers or cognitive limitation. Patient is motivated to learn. Patient understands and agrees with treatment goals and plan. PT explains patient will be examined in standing, sitting, and lying down to see how their muscles and joints work. When they are ready, they will be asked to remove their underwear so PT can examine their perineum. The patient is also given the option of providing their own chaperone as one is not provided in our facility. The patient also has the right and is explained the right to defer or refuse any part of the evaluation or treatment including the internal exam. With the patient's consent, PT will use one gloved finger to gently assess the muscles of the pelvic floor, seeing how well it contracts and relaxes and if there is muscle symmetry. After, the patient will get dressed and PT and patient will discuss exam findings and plan of care. PT and patient discuss plan of care, schedule, attendance policy and HEP activities.  Therapist gloved finger in the vaginal canal working on the introitus, urethra sphincter, iliococcygeus, obturator internist and posterior vaginal canal to elongate the tissue and reduce the trigger points.  Neuromuscular re-education: Pelvic floor contraction training: Therapist gloved finger in the vaginal canal working on pelvic floor contraction while breathing out and not holding her breath Down training: Educated patient on the urge to void technique and she was able to return demonstration         03/14/24 Manual: Soft tissue mobilization: Circular massage to the abdomen to promote peristalic motion of the intestines and educated patient on how to perform at home Manual work to bilateral hip adductors to lengthen the muscles Myofascial release: Fascial release of the lower abdomen especially above the pubic bone to reduce restrictions around the bladder and  reduce abdominal pain Neuromuscular re-education: Down training: Diaphragmatic breathing in sitting with tactile cues on the rib cage to assist  with expansion Therapeutic activities: Functional strengthening activities: Educated patient on how to sit on the commode with knees above hips, diaphragmatic breathing to relax the pelvic floor, how to generate pressure to push the stool out and patient was abel to return demonstration Self-care: Reviewed with patient on vaginal moisturizers and how the assist with vaginal health                                                                                                                          DATE: 03/07/24  EVAL Examination completed, findings reviewed, pt educated on POC, HEP, and female pelvic floor anatomy, reasoning with pelvic floor assessment internally with pt consent. Pt motivated to participate in PT and agreeable to attempt recommendations.     PATIENT EDUCATION:  04/04/24 Education details: Access Code: [email or web address], education on urge to void technique Person educated: Patient Education method: Explanation, Demonstration, Tactile cues, Verbal cues, and Handouts Education comprehension: verbalized understanding, returned demonstration, verbal cues required, tactile cues required, and needs further education  HOME EXERCISE PROGRAM: 04/04/24 Access Code: [email or web address] URL: [email or web address] Date: 04/04/2024 Prepared by: Channing Pereyra  Exercises - Seated Diaphragmatic Breathing  - 2 x daily - 7 x weekly - 1 sets - 10 reps - Supine March  - 1 x daily - 3 x weekly - 1 sets - 10 reps - Dead Bug Alternating Arm Extension  - 1 x daily - 3 x weekly - 1 sets - 10 reps - Bridge with Arms Raised  - 1 x daily - 3 x weekly - 2 sets - 10 reps  Patient Education - Abdominal Massage for Constipation   ASSESSMENT:  CLINICAL IMPRESSION: Patient is a 38 y.o. female who was seen today for physical therapy  treatment for stress  incontinence and dyspareunia . Patient has not leaked since last visit. She is using the urge to void and is helping. She has improved scar mobility. She is learning how to engage her core. Patient will benefit from skilled therapy to work on reducing urinary leakage and pelvic pain.   OBJECTIVE IMPAIRMENTS: decreased activity tolerance, decreased coordination, decreased strength, increased fascial restrictions, increased muscle spasms, and pain.   ACTIVITY LIMITATIONS: continence and toileting  PARTICIPATION LIMITATIONS: interpersonal relationship, shopping, and community activity  PERSONAL FACTORS: cholecystectomy; Myomectomy; Multiple sclerosis; Neuromuscular disorder are also affecting patient's functional outcome.   REHAB POTENTIAL: Excellent  CLINICAL DECISION MAKING: Evolving/moderate complexity  EVALUATION COMPLEXITY: Moderate   GOALS: Goals reviewed with patient? Yes  SHORT TERM GOALS: Target date: 04/04/24  Patient independent with initial HEP for flexibility and diaphragmatic breathing.  Baseline: Goal status: Met 04/04/24  2.  Patient educated on how to massage the introitus to work on tissue mobility.  Baseline:  Goal status: Met 03/28/24  3.  Patient educated on vaginal health and vaginal moisturizers.  Baseline:  Goal status: Met 03/14/24  4.  Patient educated on urge to void technique.  Baseline:  Goal status: Met 03/28/24   LONG TERM  GOALS: Target date: 05/30/24  Patient independent with advanced HEP for core and pelvic floor strength to reduce her urinary leakage.  Baseline:  Goal status: INITIAL  2.  Patient is able to have penile penetration vaginally with 0-1/10 pain due to the reduction of trigger points in the muscles.  Baseline:  Goal status: INITIAL  3.  Patient pelvic floor strength is >/= 3/5 holding 10 seconds to reduce her urinary leakage with coughing, sneezing and laughing >/= 50%.  Baseline:  Goal status: INITIAL  4.  Patient  understands how to have a bowel movement correctly to reduce strain on pelvic floor after a hysterectomy.  Baseline:  Goal status: INITIAL   PLAN:  PT FREQUENCY: 1-2x/week  PT DURATION: 12 weeks  PLANNED INTERVENTIONS: 97110-Therapeutic exercises, 97530- Therapeutic activity, 97112- Neuromuscular re-education, 97535- Self Care, [id number]- Manual therapy, Patient/Family education, Joint mobilization, Spinal mobilization, and Biofeedback  PLAN FOR NEXT SESSION:  see how bowel movements are going,   hip adductor stretch, core engagement; pain with intercourse   Channing Pereyra, PT 04/04/24 10:16 AM

## 2024-04-18 ENCOUNTER — Encounter: Payer: Self-pay | Admitting: Physical Therapy

## 2024-04-18 ENCOUNTER — Encounter: Attending: Obstetrics and Gynecology | Admitting: Physical Therapy

## 2024-04-18 DIAGNOSIS — M6281 Muscle weakness (generalized): Secondary | ICD-10-CM | POA: Insufficient documentation

## 2024-04-18 DIAGNOSIS — R102 Pelvic and perineal pain unspecified side: Secondary | ICD-10-CM | POA: Diagnosis present

## 2024-04-18 NOTE — Therapy (Signed)
 OUTPATIENT PHYSICAL THERAPY FEMALE PELVIC TREATMENT   Patient Name: [name] MRN: [id number] DOB:December 17, 1985, 38 y.o., female Today's Date: 04/18/2024  END OF SESSION:  PT End of Session - 04/18/24 1507     Visit Number 5    Date for Recertification  05/30/24    Authorization Type [employer] medicare/medicaid    Authorization - Visit Number 5    Authorization - Number of Visits 10    PT Start Time 1500    PT Stop Time 1545    PT Time Calculation (min) 45 min    Activity Tolerance Patient tolerated treatment well    Behavior During Therapy [facility] for tasks assessed/performed          Past Medical History:  Diagnosis Date   Abnormal Pap smear    Colpo>normal   Cholestasis during pregnancy in third trimester 07/12/2020   07/11/20 Bile acids 13.8     Fibroid    Gonorrhea    Headache    last migraine 01/2017   History of IBS    watch diet, no meds   Infection    UTI   Multiple sclerosis    Neuromuscular disorder (HCC)    diagnosed with MS this visit   Obesity    Ovarian cyst    Pernicious anemia 05/02/2013   Pregnancy induced hypertension    Preterm labor    all 3 deliveries   SVD (spontaneous vaginal delivery)    x 3   Urinary tract infection    Vaginal Pap smear, abnormal    Past Surgical History:  Procedure Laterality Date   CHOLECYSTECTOMY N/A 11/25/2017   Procedure: LAPAROSCOPIC CHOLECYSTECTOMY WITH INTRAOPERATIVE CHOLANGIOGRAM;  Surgeon: Eletha Boas, MD;  Location: [facility];  Service: General;  Laterality: N/A;   COLPOSCOPY     MYOMECTOMY N/A 04/01/2017   Procedure: Vaginal Myomectomy;  Surgeon: Herchel Gloris LABOR, MD;  Location: [facility];  Service: Gynecology;  Laterality: N/A;   TUBAL LIGATION     2012/ reversal June 2020   WISDOM TOOTH EXTRACTION     Patient Active Problem List   Diagnosis Date Noted   DUB (dysfunctional uterine bleeding) 02/16/2024   Hyperglycemia 07/22/2020   Maternal morbid obesity, antepartum (HCC) 07/11/2020   BMI 40.0-44.9, adult  (HCC) 07/11/2020   Hx of preeclampsia, prior pregnancy, currently pregnant 05/23/2020   History of reversal of tubal ligation 12/20/2019   Adenomyosis 05/09/2019   Anxiety 11/28/2018   Endometriosis 05/23/2018   Fibroid of cervix    Cervical mass 12/23/2016   Child behavior problem 03/27/2016   Malabsorption 08/29/2013   Acute relapsing multiple sclerosis (HCC) 07/22/2013   B12 deficiency 06/27/2013   Insomnia 06/27/2013   Adjustment disorder with mixed anxiety and depressed mood 03/13/2013   Multiple sclerosis affecting pregnancy in third trimester 03/13/2013    PCP: Shelda Atlas, MD  REFERRING PROVIDER: Delores Nidia LITTIE, FNP    REFERRING DIAG:  N94.10 (ICD-10-CM) - Dyspareunia in female  N39.3 (ICD-10-CM) - Stress incontinence    THERAPY DIAG:  Muscle weakness (generalized)  Pelvic pain  Rationale for Evaluation and Treatment: Rehabilitation  ONSET DATE: 2012  SUBJECTIVE:  SUBJECTIVE STATEMENT: No issues with bowel movements. No pain with vaginal penetration. No urinary leakage. Patient is able to walk to the bathroom and not leak.   Fluid intake: water, juice, soda  FUNCTIONAL LIMITATIONS: someday's has to use the walker depending on her MS  PERTINENT HISTORY:  Surgeries: cholecystectomy; Myomectomy; Multiple sclerosis; Neuromuscular disorder  Sexual abuse: No   PAIN:  Are you having pain? Yes NPRS scale: 7/10 03/28/24: pain level 5/10 Pain location: Vaginal  Pain type: sharp Pain description: intermittent   Aggravating factors: vaginal penetration Relieving factors: no vaginal penetration  PRECAUTIONS: Other: multiple sclerosis  RED FLAGS: None   WEIGHT BEARING RESTRICTIONS: No  FALLS:  Has patient fallen in last 6 months? No  OCCUPATION:  disability  ACTIVITY LEVEL : walking  PLOF: Independent  PATIENT GOALS: reduce leakage and pain with intercourse   BOWEL MOVEMENT: No issues   URINATION: Pain with urination: No Fully empty bladder: No feels like she has to urinate again                     Post-void dribble: Yes  Stream: Strong Urgency: Yes  Frequency:during the day every 3-4 hours                                                         Nocturia: No1 time   Leakage: Coughing, Sneezing, and Laughing Pads/briefs: Yes: 1 per day  INTERCOURSE:  Ability to have vaginal penetration Yes  Pain with intercourse: Deep Penetration Dryness: Yes  Climax: no Marinoff Scale: 1/3 Lubricant:: just starting  PREGNANCY: Vaginal deliveries 5 Tearing No  PROLAPSE: None   OBJECTIVE:  Note: Objective measures were completed at Evaluation unless otherwise noted.  DIAGNOSTIC FINDINGS:  one  PATIENT SURVEYS:  PFIQ-7: 50 UIQ-7: 48  COGNITION: Overall cognitive status: Within functional limits for tasks assessed     SENSATION: Light touch: Appears intact   POSTURE: rounded shoulders, forward head, and increased lumbar lordosis   LUMBARAROM/PROM:  A/PROM A/PROM  Eval (% available)  Extension 50  Left lateral flexion 75   (Blank rows = not tested)  LOWER EXTREMITY ROM: Bi lateal hip ROM is full   LOWER EXTREMITY MMT:  MMT Right eval Left eval  Hip extension 5/5 4/5  Hip abduction 5/5 3+/5   (Blank rows = not tested) PALPATION:  Pelvic Alignment: ASIS are equal  Abdominal: tenderness located throughout the abdomen  Diastasis: Yes: 1 finger width above and below the umbilicus with good tension Distortion: No  Breathing: chest breather, difficulty with opening the lower rib cage                 External Perineal Exam: good coloring of the vulva                             Internal Pelvic Floor: tightness on the sides of the introitus, pain in the iliococcygeus  Patient confirms  identification and approves PT to assess internal pelvic floor and treatment Yes No emotional/communication barriers or cognitive limitation. Patient is motivated to learn. Patient understands and agrees with treatment goals and plan. PT explains patient will be examined in standing, sitting, and lying down to see how their muscles and joints work. When they are ready, they  will be asked to remove their underwear so PT can examine their perineum. The patient is also given the option of providing their own chaperone as one is not provided in our facility. The patient also has the right and is explained the right to defer or refuse any part of the evaluation or treatment including the internal exam. With the patient's consent, PT will use one gloved finger to gently assess the muscles of the pelvic floor, seeing how well it contracts and relaxes and if there is muscle symmetry. After, the patient will get dressed and PT and patient will discuss exam findings and plan of care. PT and patient discuss plan of care, schedule, attendance policy and HEP activities.   PELVIC MMT:   MMT eval 03/28/24  Vaginal 3/5 posteriorly; other areas 2/5; hold 2 sec 4/5 after manual work  (Blank rows = not tested)        TONE:    PROLAPSE: Anterior and posterior wall weakness just above the introitus  TODAY'S TREATMENT:   04/18/24 Manual: Soft tissue mobilization: Circular massage to abdomen to work on peristalic motion Neuromuscular re-education: Form correction: Bridge with bilateral shoulder flexion with red band 20 x Supine alternate shoulder flexion with red band 20 x and core engaged Supine alternate hip flexion with core engaged 20 x Supine shoulder horizontal abduction with red band 15 x  Quadruped lift alternate extremity  Therapeutic activities: Functional strengthening activities: Sit to stand with leaning forward and breath out Supine to sitting with log rolling and breathing  out Self-care: Discussed with patient on things to do after surgery including no straining  with bowel movement, soft foods in the beginning, spandex pants, belly band, laying down with legs elevated to reduce the swelling Educated patient on circular massage to abdomen for after surgery to promote peristalic motion     04/04/24 Manual: Scar tissue mobilization: Manual work around the lower abdominal scar working through restrictions and mobility Myofascial release: Fascial release along the pubovesical ligament and urachus ligament Fascial release above the pubic bone Neuromuscular re-education: Core retraining: Transverse abdominus contraction 10 x Supine alternate hip flexion with core engaged 20 x Supine alternate shoulder flexion with red band 20 x and core engaged Bridge with bilateral shoulder flexion with red band 20 x     03/28/24 Manual: Soft tissue mobilization: Manual work along the levator ani, ischiocavernosus, and perineal body externally to elongate the tissue Scar tissue mobilization: Manual work to the lower abdominal scar to elongate the tissue Internal pelvic floor techniques: No emotional/communication barriers or cognitive limitation. Patient is motivated to learn. Patient understands and agrees with treatment goals and plan. PT explains patient will be examined in standing, sitting, and lying down to see how their muscles and joints work. When they are ready, they will be asked to remove their underwear so PT can examine their perineum. The patient is also given the option of providing their own chaperone as one is not provided in our facility. The patient also has the right and is explained the right to defer or refuse any part of the evaluation or treatment including the internal exam. With the patient's consent, PT will use one gloved finger to gently assess the muscles of the pelvic floor, seeing how well it contracts and relaxes and if there is muscle symmetry.  After, the patient will get dressed and PT and patient will discuss exam findings and plan of care. PT and patient discuss plan of care, schedule, attendance policy and  HEP activities.  Therapist gloved finger in the vaginal canal working on the introitus, urethra sphincter, iliococcygeus, obturator internist and posterior vaginal canal to elongate the tissue and reduce the trigger points.  Neuromuscular re-education: Pelvic floor contraction training: Therapist gloved finger in the vaginal canal working on pelvic floor contraction while breathing out and not holding her breath Down training: Educated patient on the urge to void technique and she was able to return demonstration    PATIENT EDUCATION:  04/18/24 Education details: Access Code: [email or web address], education on urge to void technique Person educated: Patient Education method: Explanation, Demonstration, Tactile cues, Verbal cues, and Handouts Education comprehension: verbalized understanding, returned demonstration, verbal cues required, tactile cues required, and needs further education  HOME EXERCISE PROGRAM: 04/18/24 Access Code: [email or web address] URL: [email or web address] Date: 04/18/2024 Prepared by: Channing Pereyra  Exercises - Seated Diaphragmatic Breathing  - 2 x daily - 7 x weekly - 1 sets - 10 reps - Supine March  - 1 x daily - 3 x weekly - 1 sets - 10 reps - Dead Bug Alternating Arm Extension  - 1 x daily - 3 x weekly - 1 sets - 10 reps - Bridge with Arms Raised  - 1 x daily - 3 x weekly - 2 sets - 10 reps - Quadruped Pelvic Floor Contraction with Opposite Arm and Leg Lift  - 1 x daily - 7 x weekly - 1 sets - 10 reps - Supine Shoulder Horizontal Abduction with Resistance  - 1 x daily - 7 x weekly - 2 sets - 10 reps  Patient Education - Abdominal Massage for Constipation - Abdominal Massage for Constipation   ASSESSMENT:  CLINICAL IMPRESSION: Patient is a 38 y.o. female who was seen today for physical therapy   treatment for stress incontinence and dyspareunia . Patient has not leaked since last visit. She is using the urge to void and is helping. She has improved scar mobility. She is learning how to engage her core. Patient is not straining to have a bowel movement. She has learned how to move it bed and go from sit to stand with less strain on pelvic floor. No pain with penile penetration. Patient will be having a hysterectomy in December.   OBJECTIVE IMPAIRMENTS: decreased activity tolerance, decreased coordination, decreased strength, increased fascial restrictions, increased muscle spasms, and pain.   ACTIVITY LIMITATIONS: continence and toileting  PARTICIPATION LIMITATIONS: interpersonal relationship, shopping, and community activity  PERSONAL FACTORS: cholecystectomy; Myomectomy; Multiple sclerosis; Neuromuscular disorder are also affecting patient's functional outcome.   REHAB POTENTIAL: Excellent  CLINICAL DECISION MAKING: Evolving/moderate complexity  EVALUATION COMPLEXITY: Moderate   GOALS: Goals reviewed with patient? Yes  SHORT TERM GOALS: Target date: 04/04/24  Patient independent with initial HEP for flexibility and diaphragmatic breathing.  Baseline: Goal status: Met 04/04/24  2.  Patient educated on how to massage the introitus to work on tissue mobility.  Baseline:  Goal status: Met 03/28/24  3.  Patient educated on vaginal health and vaginal moisturizers.  Baseline:  Goal status: Met 03/14/24  4.  Patient educated on urge to void technique.  Baseline:  Goal status: Met 03/28/24   LONG TERM GOALS: Target date: 05/30/24  Patient independent with advanced HEP for core and pelvic floor strength to reduce her urinary leakage.  Baseline:  Goal status: Met 04/18/24  2.  Patient is able to have penile penetration vaginally with 0-1/10 pain due to the reduction of trigger points in the muscles.  Baseline:  Goal status: Met 04/18/14  3.  Patient pelvic floor strength  is >/= 3/5 holding 10 seconds to reduce her urinary leakage with coughing, sneezing and laughing >/= 50%.  Baseline:  Goal status: Met 04/18/24  4.  Patient understands how to have a bowel movement correctly to reduce strain on pelvic floor after a hysterectomy.  Baseline:  Goal status: Met 04/18/24   PLAN: Discharge to HEP this visit.     Channing Pereyra, PT 04/18/24 3:50 PM  PHYSICAL THERAPY DISCHARGE SUMMARY  Visits from Start of Care: 5  Current functional level related to goals / functional outcomes: See above.    Remaining deficits: See above.    Education / Equipment: HEP   Patient agrees to discharge. Patient goals were met. Patient is being discharged due to meeting the stated rehab goals. Thank you for the referral.   Channing Pereyra, PT 04/18/24 3:50 PM

## 2024-04-19 ENCOUNTER — Ambulatory Visit: Payer: Self-pay | Admitting: Physical Therapy

## 2024-05-03 NOTE — Pre-Procedure Instructions (Signed)
 Surgical Instructions   Your procedure is scheduled on May 15, 2024. Report to [facility] Main Entrance A at 1:00 P.M., then check in with the Admitting office. Any questions or running late day of surgery: call [phone]  Questions prior to your surgery date: call [phone], Monday-Friday, 8am-4pm. If you experience any cold or flu symptoms such as cough, fever, chills, shortness of breath, etc. between now and your scheduled surgery, please notify us  at the above number.     Remember:  Do not eat after midnight the night before your surgery   You may drink clear liquids until 12:00 PM the afternoon of your surgery.   Clear liquids allowed are: Water, Non-Citrus Juices (without pulp), Carbonated Beverages, Clear Tea (no milk, honey, etc.), Black Coffee Only (NO MILK, CREAM OR POWDERED CREAMER of any kind), and Gatorade.    Take these medicines the morning of surgery with A SIP OF WATER: oxyCODONE -acetaminophen  (PERCOCET)    May take these medicines IF NEEDED: acetaminophen  (TYLENOL )  cyclobenzaprine  (FLEXERIL )  famotidine  (PEPCID )  pantoprazole  (PROTONIX )    One week prior to surgery, STOP taking any Aspirin  (unless otherwise instructed by your surgeon) Aleve , Naproxen , Ibuprofen , Motrin , Advil , Goody's, BC's, all herbal medications, fish oil, and non-prescription vitamins.                     Do NOT Smoke (Tobacco/Vaping) for 24 hours prior to your procedure.  If you use a CPAP at night, you may bring your mask/headgear for your overnight stay.   You will be asked to remove any contacts, glasses, piercing's, hearing aid's, dentures/partials prior to surgery. Please bring cases for these items if needed.    Patients discharged the day of surgery will not be allowed to drive home, and someone needs to stay with them for 24 hours.  SURGICAL WAITING ROOM VISITATION Patients may have no more than 2 support people in the waiting area - these visitors may rotate.    Pre-op nurse will coordinate an appropriate time for 1 ADULT support person, who may not rotate, to accompany patient in pre-op.  Children under the age of 17 must have an adult with them who is not the patient and must remain in the main waiting area with an adult.  If the patient needs to stay at the hospital during part of their recovery, the visitor guidelines for inpatient rooms apply.  Please refer to the [facility] website for the visitor guidelines for any additional information.   If you received a COVID test during your pre-op visit  it is requested that you wear a mask when out in public, stay away from anyone that may not be feeling well and notify your surgeon if you develop symptoms. If you have been in contact with anyone that has tested positive in the last 10 days please notify you surgeon.      Pre-operative CHG Bathing Instructions   You can play a key role in reducing the risk of infection after surgery. Your skin needs to be as free of germs as possible. You can reduce the number of germs on your skin by washing with CHG (chlorhexidine  gluconate) soap before surgery. CHG is an antiseptic soap that kills germs and continues to kill germs even after washing.   DO NOT use if you have an allergy to chlorhexidine /CHG or antibacterial soaps. If your skin becomes reddened or irritated, stop using the CHG and notify one of our RNs at [phone].  TAKE A SHOWER THE NIGHT BEFORE SURGERY   Please keep in mind the following:  DO NOT shave, including legs and underarms, 48 hours prior to surgery.   You may shave your face before/day of surgery.  Place clean sheets on your bed the night before surgery Use a clean washcloth (not used since being washed) for shower. DO NOT sleep with pet's night before surgery.  CHG Shower Instructions:  Wash your face and private area with normal soap. If you choose to wash your hair, wash first with your normal shampoo.  After  you use shampoo/soap, rinse your hair and body thoroughly to remove shampoo/soap residue.  Turn the water OFF and apply half the bottle of CHG soap to a CLEAN washcloth.  Apply CHG soap ONLY FROM YOUR NECK DOWN TO YOUR TOES (washing for 3-5 minutes)  DO NOT use CHG soap on face, private areas, open wounds, or sores.  Pay special attention to the area where your surgery is being performed.  If you are having back surgery, having someone wash your back for you may be helpful. Wait 2 minutes after CHG soap is applied, then you may rinse off the CHG soap.  Pat dry with a clean towel  Put on clean pajamas    Additional instructions for the day of surgery: If you choose, you may shower the morning of surgery with an antibacterial soap.  DO NOT APPLY any lotions, deodorants, cologne, or perfumes.   Do not wear jewelry or makeup Do not wear nail polish, gel polish, artificial nails, or any other type of covering on natural nails (fingers and toes) Do not bring valuables to the hospital. [facility] is not responsible for valuables/personal belongings. Put on clean/comfortable clothes.  Please brush your teeth.  Ask your nurse before applying any prescription medications to the skin.

## 2024-05-04 ENCOUNTER — Other Ambulatory Visit: Payer: Self-pay

## 2024-05-04 ENCOUNTER — Encounter (HOSPITAL_COMMUNITY): Payer: Self-pay

## 2024-05-04 ENCOUNTER — Encounter (HOSPITAL_COMMUNITY)
Admission: RE | Admit: 2024-05-04 | Discharge: 2024-05-04 | Disposition: A | Discharge status: Home / Self Care | Source: Ambulatory Visit | Attending: Obstetrics & Gynecology | Admitting: Obstetrics & Gynecology

## 2024-05-04 VITALS — BP 125/77 | HR 73 | Temp 98.3°F | Resp 16 | Ht 64.0 in | Wt 253.3 lb

## 2024-05-04 DIAGNOSIS — Z01818 Encounter for other preprocedural examination: Secondary | ICD-10-CM | POA: Diagnosis present

## 2024-05-04 HISTORY — DX: Anxiety disorder, unspecified: F41.9

## 2024-05-04 HISTORY — DX: Gastro-esophageal reflux disease without esophagitis: K21.9

## 2024-05-04 LAB — BASIC METABOLIC PANEL WITH GFR
Anion gap: 11 (ref 5–15)
BUN: 6 mg/dL (ref 6–20)
CO2: 24 mmol/L (ref 22–32)
Calcium: 9 mg/dL (ref 8.9–10.3)
Chloride: 103 mmol/L (ref 98–111)
Creatinine, Ser: 1.12 mg/dL — ABNORMAL HIGH (ref 0.44–1.00)
GFR, Estimated: >60 mL/min (ref >60)
Glucose, Bld: 81 mg/dL (ref 70–99)
Potassium: 4.4 mmol/L (ref 3.5–5.1)
Sodium: 138 mmol/L (ref 135–145)

## 2024-05-04 LAB — CBC
HCT: 35.1 % — ABNORMAL LOW (ref 36.0–46.0)
Hemoglobin: 11 g/dL — ABNORMAL LOW (ref 12.0–15.0)
MCH: 30.1 pg (ref 26.0–34.0)
MCHC: 31.3 g/dL (ref 30.0–36.0)
MCV: 96.2 fL (ref 80.0–100.0)
Platelets: 473 K/uL — ABNORMAL HIGH (ref 150–400)
RBC: 3.65 MIL/uL — ABNORMAL LOW (ref 3.87–5.11)
RDW: 12 % (ref 11.5–15.5)
WBC: 5.6 K/uL (ref 4.0–10.5)
nRBC: 0 % (ref 0.0–0.2)

## 2024-05-04 LAB — TYPE AND SCREEN
ABO/RH(D): A POS
Antibody Screen: NEGATIVE

## 2024-05-04 NOTE — Progress Notes (Signed)
 PCP - Dr. Aliene Colon Cardiologist - Denies Neurologist - Dr. Lenn Shape [name] - last office visit 12/15/2022  PPM/ICD - Denies Device Orders - n/a Rep Notified - n/a  Chest x-ray - n/a EKG - 11/18/2023 Stress Test - Denies ECHO - Denies Cardiac Cath - Denies  Sleep Study - Denies CPAP - n/a  No DM  Last dose of GLP1 agonist- n/a  GLP1 instructions: n/a  Blood Thinner Instructions: n/a Aspirin  Instructions: n/a  ERAS Protcol - Clear liquids until 1200 afternoon of surgery PRE-SURGERY Ensure or G2- n/a  COVID TEST- n/a   Anesthesia review: No  Patient denies shortness of breath, fever, cough and chest pain at PAT appointment. Pt denies any respiratory illness/infection in the last two months.    All instructions explained to the patient, with a verbal understanding of the material. Patient agrees to go over the instructions while at home for a better understanding. Patient also instructed to self quarantine after being tested for COVID-19. The opportunity to ask questions was provided.

## 2024-05-15 ENCOUNTER — Ambulatory Visit (HOSPITAL_COMMUNITY): Admitting: Anesthesiology

## 2024-05-15 ENCOUNTER — Observation Stay (HOSPITAL_COMMUNITY)
Admission: RE | Admit: 2024-05-15 | Discharge: 2024-05-16 | Disposition: A | Attending: Obstetrics & Gynecology | Admitting: Obstetrics & Gynecology

## 2024-05-15 ENCOUNTER — Other Ambulatory Visit: Payer: Self-pay | Admitting: Obstetrics & Gynecology

## 2024-05-15 ENCOUNTER — Encounter (HOSPITAL_COMMUNITY): Payer: Self-pay | Admitting: Obstetrics & Gynecology

## 2024-05-15 ENCOUNTER — Encounter (HOSPITAL_COMMUNITY): Admission: RE | Disposition: A | Payer: Self-pay | Source: Home / Self Care | Attending: Obstetrics & Gynecology

## 2024-05-15 DIAGNOSIS — I10 Essential (primary) hypertension: Secondary | ICD-10-CM | POA: Diagnosis not present

## 2024-05-15 DIAGNOSIS — N938 Other specified abnormal uterine and vaginal bleeding: Secondary | ICD-10-CM

## 2024-05-15 DIAGNOSIS — N888 Other specified noninflammatory disorders of cervix uteri: Secondary | ICD-10-CM

## 2024-05-15 DIAGNOSIS — N8003 Adenomyosis of the uterus: Principal | ICD-10-CM

## 2024-05-15 DIAGNOSIS — E66813 Obesity, class 3: Secondary | ICD-10-CM | POA: Diagnosis not present

## 2024-05-15 DIAGNOSIS — Z6841 Body Mass Index (BMI) 40.0 and over, adult: Secondary | ICD-10-CM

## 2024-05-15 DIAGNOSIS — N920 Excessive and frequent menstruation with regular cycle: Secondary | ICD-10-CM | POA: Diagnosis not present

## 2024-05-15 DIAGNOSIS — Z01818 Encounter for other preprocedural examination: Secondary | ICD-10-CM

## 2024-05-15 DIAGNOSIS — F418 Other specified anxiety disorders: Secondary | ICD-10-CM

## 2024-05-15 DIAGNOSIS — Z79899 Other long term (current) drug therapy: Secondary | ICD-10-CM | POA: Insufficient documentation

## 2024-05-15 HISTORY — PX: HYSTERECTOMY, VAGINAL, WITH SALPINGECTOMY: SHX7588

## 2024-05-15 LAB — POCT PREGNANCY, URINE: Preg Test, Ur: NEGATIVE

## 2024-05-15 LAB — CBC
HCT: 37.7 % (ref 36.0–46.0)
Hemoglobin: 11.4 g/dL — ABNORMAL LOW (ref 12.0–15.0)
MCH: 29.7 pg (ref 26.0–34.0)
MCHC: 30.2 g/dL (ref 30.0–36.0)
MCV: 98.2 fL (ref 80.0–100.0)
Platelets: 460 K/uL — ABNORMAL HIGH (ref 150–400)
RBC: 3.84 MIL/uL — ABNORMAL LOW (ref 3.87–5.11)
RDW: 12.2 % (ref 11.5–15.5)
WBC: 5.7 K/uL (ref 4.0–10.5)
nRBC: 0 % (ref 0.0–0.2)

## 2024-05-15 SURGERY — HYSTERECTOMY, VAGINAL, WITH SALPINGECTOMY
Anesthesia: General | Site: "Vagina "

## 2024-05-15 MED ORDER — HYDROMORPHONE HCL 1 MG/ML IJ SOLN
1.0000 mg | INTRAMUSCULAR | Status: DC | PRN
  Administered 2024-05-16 (×3): 1 mg via INTRAVENOUS
  Filled 2024-05-15 (×3): qty 1

## 2024-05-15 MED ORDER — ONDANSETRON HCL 4 MG/2ML IJ SOLN: 4.0000 mg | Freq: Four times a day (QID) | INTRAMUSCULAR | Status: DC | PRN

## 2024-05-15 MED ORDER — MEPERIDINE HCL 25 MG/ML IJ SOLN: 6.2500 mg | INTRAMUSCULAR | Status: DC | PRN

## 2024-05-15 MED ORDER — LIDOCAINE 2% (20 MG/ML) 5 ML SYRINGE
INTRAMUSCULAR | Status: DC | PRN
  Administered 2024-05-15: 100 mg via INTRAVENOUS

## 2024-05-15 MED ORDER — ONDANSETRON HCL 4 MG/2ML IJ SOLN
INTRAMUSCULAR | Status: AC
  Filled 2024-05-15: qty 2

## 2024-05-15 MED ORDER — ONDANSETRON HCL 4 MG PO TABS: 4.0000 mg | ORAL_TABLET | Freq: Four times a day (QID) | ORAL | Status: DC | PRN

## 2024-05-15 MED ORDER — PROPOFOL 10 MG/ML IV BOLUS
INTRAVENOUS | Status: AC
  Filled 2024-05-15: qty 20

## 2024-05-15 MED ORDER — IBUPROFEN 600 MG PO TABS: 600.0000 mg | ORAL_TABLET | Freq: Four times a day (QID) | ORAL | Status: DC

## 2024-05-15 MED ORDER — ROCURONIUM BROMIDE 10 MG/ML (PF) SYRINGE
PREFILLED_SYRINGE | INTRAVENOUS | Status: DC | PRN
  Administered 2024-05-15: 60 mg via INTRAVENOUS
  Administered 2024-05-15: 30 mg via INTRAVENOUS
  Administered 2024-05-15: 40 mg via INTRAVENOUS

## 2024-05-15 MED ORDER — CEFAZOLIN SODIUM-DEXTROSE 2-4 GM/100ML-% IV SOLN
2.0000 g | INTRAVENOUS | Status: AC
  Administered 2024-05-15: 2 g via INTRAVENOUS
  Filled 2024-05-15: qty 100

## 2024-05-15 MED ORDER — DEXAMETHASONE SOD PHOSPHATE PF 10 MG/ML IJ SOLN
INTRAMUSCULAR | Status: DC | PRN
  Administered 2024-05-15: 10 mg via INTRAVENOUS

## 2024-05-15 MED ORDER — LACTATED RINGERS IV SOLN: INTRAVENOUS | Status: DC

## 2024-05-15 MED ORDER — MIDAZOLAM HCL (PF) 2 MG/2ML IJ SOLN
INTRAMUSCULAR | Status: DC | PRN
  Administered 2024-05-15: 2 mg via INTRAVENOUS

## 2024-05-15 MED ORDER — MIDAZOLAM HCL 2 MG/2ML IJ SOLN
INTRAMUSCULAR | Status: AC
  Filled 2024-05-15: qty 2

## 2024-05-15 MED ORDER — GLYCOPYRROLATE PF 0.2 MG/ML IJ SOSY
PREFILLED_SYRINGE | INTRAMUSCULAR | Status: DC | PRN
  Administered 2024-05-15: .1 mg via INTRAVENOUS

## 2024-05-15 MED ORDER — KETOROLAC TROMETHAMINE 30 MG/ML IJ SOLN
INTRAMUSCULAR | Status: AC
  Filled 2024-05-15: qty 1

## 2024-05-15 MED ORDER — KETOROLAC TROMETHAMINE 30 MG/ML IJ SOLN
30.0000 mg | Freq: Once | INTRAMUSCULAR | Status: AC
  Administered 2024-05-15: 30 mg via INTRAVENOUS

## 2024-05-15 MED ORDER — HYDROMORPHONE HCL 1 MG/ML IJ SOLN
INTRAMUSCULAR | Status: AC
  Filled 2024-05-15: qty 0.5

## 2024-05-15 MED ORDER — ZOLPIDEM TARTRATE 5 MG PO TABS
5.0000 mg | ORAL_TABLET | Freq: Every evening | ORAL | Status: DC | PRN
  Administered 2024-05-16: 5 mg via ORAL
  Filled 2024-05-15: qty 1

## 2024-05-15 MED ORDER — ALBUMIN HUMAN 5 % IV SOLN: INTRAVENOUS | Status: DC | PRN

## 2024-05-15 MED ORDER — SUGAMMADEX SODIUM 200 MG/2ML IV SOLN
INTRAVENOUS | Status: DC | PRN
  Administered 2024-05-15: 300 mg via INTRAVENOUS

## 2024-05-15 MED ORDER — ONDANSETRON HCL 4 MG/2ML IJ SOLN
INTRAMUSCULAR | Status: DC | PRN
  Administered 2024-05-15 (×2): 4 mg via INTRAVENOUS

## 2024-05-15 MED ORDER — OXYCODONE HCL 5 MG PO TABS: 5.0000 mg | ORAL_TABLET | Freq: Once | ORAL | Status: DC | PRN

## 2024-05-15 MED ORDER — HYDROMORPHONE HCL 1 MG/ML IJ SOLN
INTRAMUSCULAR | Status: AC
  Filled 2024-05-15: qty 1

## 2024-05-15 MED ORDER — PROPOFOL 500 MG/50ML IV EMUL
INTRAVENOUS | Status: DC | PRN
  Administered 2024-05-15: 150 ug/kg/min via INTRAVENOUS

## 2024-05-15 MED ORDER — OXYCODONE HCL 5 MG PO TABS: 5.0000 mg | ORAL_TABLET | ORAL | Status: DC | PRN

## 2024-05-15 MED ORDER — HYDROMORPHONE HCL 1 MG/ML IJ SOLN
INTRAMUSCULAR | Status: DC | PRN
  Administered 2024-05-15: .5 mg via INTRAVENOUS

## 2024-05-15 MED ORDER — KETOROLAC TROMETHAMINE 30 MG/ML IJ SOLN
30.0000 mg | Freq: Four times a day (QID) | INTRAMUSCULAR | Status: DC
  Administered 2024-05-16 (×2): 30 mg via INTRAVENOUS
  Filled 2024-05-15 (×2): qty 1

## 2024-05-15 MED ORDER — POVIDONE-IODINE 10 % EX SWAB
2.0000 | Freq: Once | CUTANEOUS | Status: AC
  Administered 2024-05-15: 2 via TOPICAL

## 2024-05-15 MED ORDER — LIDOCAINE-EPINEPHRINE (PF) 1 %-1:200000 IJ SOLN
INTRAMUSCULAR | Status: DC | PRN
  Administered 2024-05-15: 10 mL

## 2024-05-15 MED ORDER — FENTANYL CITRATE (PF) 250 MCG/5ML IJ SOLN
INTRAMUSCULAR | Status: AC
  Filled 2024-05-15: qty 5

## 2024-05-15 MED ORDER — AMISULPRIDE (ANTIEMETIC) 5 MG/2ML IV SOLN: 10.0000 mg | Freq: Once | INTRAVENOUS | Status: DC | PRN

## 2024-05-15 MED ORDER — 0.9 % SODIUM CHLORIDE (POUR BTL) OPTIME
TOPICAL | Status: DC | PRN
  Administered 2024-05-15: 1000 mL

## 2024-05-15 MED ORDER — ORAL CARE MOUTH RINSE: 15.0000 mL | Freq: Once | OROMUCOSAL | Status: AC

## 2024-05-15 MED ORDER — ROCURONIUM BROMIDE 10 MG/ML (PF) SYRINGE
PREFILLED_SYRINGE | INTRAVENOUS | Status: AC
  Filled 2024-05-15: qty 10

## 2024-05-15 MED ORDER — PROPOFOL 10 MG/ML IV BOLUS
INTRAVENOUS | Status: DC | PRN
  Administered 2024-05-15: 200 mg via INTRAVENOUS

## 2024-05-15 MED ORDER — OXYCODONE HCL 5 MG/5ML PO SOLN: 5.0000 mg | Freq: Once | ORAL | Status: DC | PRN

## 2024-05-15 MED ORDER — FENTANYL CITRATE (PF) 250 MCG/5ML IJ SOLN
INTRAMUSCULAR | Status: DC | PRN
  Administered 2024-05-15: 50 ug via INTRAVENOUS
  Administered 2024-05-15 (×2): 100 ug via INTRAVENOUS

## 2024-05-15 MED ORDER — LIDOCAINE 2% (20 MG/ML) 5 ML SYRINGE
INTRAMUSCULAR | Status: AC
  Filled 2024-05-15: qty 5

## 2024-05-15 MED ORDER — CHLORHEXIDINE GLUCONATE 0.12 % MT SOLN
15.0000 mL | Freq: Once | OROMUCOSAL | Status: AC
  Administered 2024-05-15: 15 mL via OROMUCOSAL
  Filled 2024-05-15: qty 15

## 2024-05-15 MED ORDER — ALUM & MAG HYDROXIDE-SIMETH 200-200-20 MG/5ML PO SUSP: 30.0000 mL | ORAL | Status: DC | PRN

## 2024-05-15 MED ORDER — HYDROMORPHONE HCL 1 MG/ML IJ SOLN
0.2500 mg | INTRAMUSCULAR | Status: DC | PRN
  Administered 2024-05-15 (×2): 0.25 mg via INTRAVENOUS
  Administered 2024-05-15: 0.5 mg via INTRAVENOUS

## 2024-05-15 SURGICAL SUPPLY — 21 items
COVER MAYO STAND STRL (DRAPES) IMPLANT
GAUZE 4X4 16PLY ~~LOC~~+RFID DBL (SPONGE) ×2 IMPLANT
GLOVE BIO SURGEON STRL SZ 6.5 (GLOVE) ×2 IMPLANT
GLOVE BIOGEL PI IND STRL 7.0 (GLOVE) ×2 IMPLANT
GLOVE BIOGEL PI MICRO STRL 6 (GLOVE) ×2 IMPLANT
GOWN STRL REUS W/ TWL LRG LVL3 (GOWN DISPOSABLE) ×2 IMPLANT
HIBICLENS CHG 4% 4OZ BTL (MISCELLANEOUS) ×4 IMPLANT
HOLDER FOLEY CATH W/STRAP (MISCELLANEOUS) ×2 IMPLANT
KIT TURNOVER KIT B (KITS) ×2 IMPLANT
LIGASURE IMPACT 36 18CM CVD LR (INSTRUMENTS) IMPLANT
PACK VAGINAL WOMENS (CUSTOM PROCEDURE TRAY) ×2 IMPLANT
PAD OB MATERNITY 11 LF (PERSONAL CARE ITEMS) ×2 IMPLANT
SLEEVE SCD COMPRESS KNEE MED (STOCKING) ×2 IMPLANT
SOLN 0.9% NACL POUR BTL 1000ML (IV SOLUTION) ×2 IMPLANT
SUT VIC AB 0 CT1 18XCR BRD8 (SUTURE) ×2 IMPLANT
SUT VIC AB 0 CT1 36 (SUTURE) ×2 IMPLANT
SUT VIC AB 2-0 CT1 TAPERPNT 27 (SUTURE) IMPLANT
SUT VICRYL 0 TIES 12 18 (SUTURE) IMPLANT
TOWEL GREEN STERILE FF (TOWEL DISPOSABLE) ×2 IMPLANT
TRAY FOLEY W/BAG SLVR 14FR LF (SET/KITS/TRAYS/PACK) ×2 IMPLANT
UNDERPAD 30X36 HEAVY ABSORB (UNDERPADS AND DIAPERS) ×2 IMPLANT

## 2024-05-15 NOTE — H&P (Signed)
 [name] is an 38 y.o. female. [id number] Patient's last menstrual period was 04/23/2024 (approximate). Patient has history of menorrhagia and dysmenorrhea. She declines further hormonal management and is scheduled for [facility] BS. She declined ablation.  Pertinent Gynecological History: Bleeding: dysfunctional uterine bleeding Contraception: BTL Blood transfusions: none Sexually transmitted diseases: no past history Previous GYN Procedures: vaginal myomectomy  Last mammogram: normal Date: 12/2023 Last pap: normal Date: 2021    Menstrual History:  Patient's last menstrual period was 04/23/2024 (approximate).    Past Medical History:  Diagnosis Date   Abnormal Pap smear    Colpo>normal   Anxiety    Cholestasis during pregnancy in third trimester 07/12/2020   07/11/20 Bile acids 13.8     Fibroid    GERD (gastroesophageal reflux disease)    Gonorrhea    Headache    last migraine 01/2017   History of IBS    watch diet, no meds   Infection    UTI   Multiple sclerosis    Neuromuscular disorder (HCC)    diagnosed with MS this visit   Obesity    Ovarian cyst    Pernicious anemia 05/02/2013   Pregnancy induced hypertension    Preterm labor    all 3 deliveries   SVD (spontaneous vaginal delivery)    x 3   Urinary tract infection    Vaginal Pap smear, abnormal     Past Surgical History:  Procedure Laterality Date   CHOLECYSTECTOMY N/A 11/25/2017   Procedure: LAPAROSCOPIC CHOLECYSTECTOMY WITH INTRAOPERATIVE CHOLANGIOGRAM;  Surgeon: Eletha Boas, MD;  Location: [facility];  Service: General;  Laterality: N/A;   COLPOSCOPY     MYOMECTOMY N/A 04/01/2017   Procedure: Vaginal Myomectomy;  Surgeon: Herchel Gloris LABOR, MD;  Location: [facility];  Service: Gynecology;  Laterality: N/A;   TUBAL LIGATION     2012/ reversal June 2020   WISDOM TOOTH EXTRACTION      Family History  Problem Relation Age of Onset   Diabetes Mother    Hypertension Mother    Diabetes Father    Hypertension  Father    Kidney disease Father    Stroke Father    Learning disabilities Brother    Anesthesia problems Neg Hx    Other Neg Hx     Social History:  reports that she has never smoked. She has never used smokeless tobacco. She reports that she does not drink alcohol and does not use drugs.  Allergies: No Known Allergies  Medications Prior to Admission  Medication Sig Dispense Refill Last Dose/Taking   acetaminophen  (TYLENOL ) 325 MG tablet Take 650 mg by mouth every 6 (six) hours as needed for moderate pain (pain score 4-6).   05/14/2024   cyclobenzaprine  (FLEXERIL ) 10 MG tablet Take 5-10 mg by mouth 3 (three) times daily as needed for muscle spasms.   05/14/2024   hydrOXYzine  (ATARAX ) 25 MG tablet Take 25 mg by mouth at bedtime as needed (sleep).   Past Week   ibuprofen  (ADVIL ) 800 MG tablet Take 800 mg by mouth every 8 (eight) hours as needed for moderate pain (pain score 4-6).   Past Week   oxyCODONE -acetaminophen  (PERCOCET) 10-325 MG tablet Take 1 tablet by mouth in the morning, at noon, and at bedtime.   Past Month   pantoprazole  (PROTONIX ) 40 MG tablet Take 40 mg by mouth daily as needed (acid reflux).   05/14/2024   Vitamin D , Ergocalciferol , (DRISDOL ) 1.25 MG (50000 UNIT) CAPS capsule Take 50,000 Units by mouth once a  week.   Past Week   cyanocobalamin  (VITAMIN B12) 1000 MCG/ML injection Inject 1,000 mcg into the skin every 30 (thirty) days.   More than a month   dicyclomine  (BENTYL ) 20 MG tablet Take 1 tablet (20 mg total) by mouth 3 (three) times daily before meals. (Patient not taking: Reported on 05/01/2024) 90 tablet 0 Not Taking   famotidine  (PEPCID ) 20 MG tablet Take 1 tablet (20 mg total) by mouth 2 (two) times daily. (Patient taking differently: Take 20 mg by mouth daily as needed for heartburn or indigestion.) 20 tablet 0 More than a month   medroxyPROGESTERone  (DEPO-PROVERA ) 150 MG/ML injection Inject 1 mL (150 mg total) into the muscle every 3 (three) months. (Patient not  taking: Reported on 05/01/2024) 1 mL 3 Not Taking   ocrelizumab (OCREVUS) 300 MG/10ML injection Inject 300 mg into the vein every 6 (six) months.   More than a month   omeprazole  (PRILOSEC) 20 MG capsule Take 1 capsule (20 mg total) by mouth daily. (Patient not taking: Reported on 05/01/2024) 21 capsule 0 Not Taking   ondansetron  (ZOFRAN -ODT) 4 MG disintegrating tablet Take 1 tablet (4 mg total) by mouth every 8 (eight) hours as needed for nausea or vomiting. (Patient not taking: No sig reported) 10 tablet 0     Review of Systems  Constitutional: Negative.   Respiratory: Negative.    Cardiovascular: Negative.   Gastrointestinal: Negative.   Genitourinary:  Positive for menstrual problem, pelvic pain and vaginal bleeding.    Blood pressure (!) 147/92, pulse 76, temperature 97.8 F (36.6 C), temperature source Oral, resp. rate 18, height 5' 4 (1.626 m), weight 112 kg, last menstrual period 04/23/2024, SpO2 100%. Physical Exam Constitutional:      Appearance: Normal appearance.  HENT:     Head: Normocephalic and atraumatic.  Cardiovascular:     Rate and Rhythm: Normal rate.  Pulmonary:     Effort: Pulmonary effort is normal.  Musculoskeletal:     Cervical back: Normal range of motion.  Skin:    General: Skin is warm and dry.     Coloration: Skin is not pale.  Neurological:     Mental Status: She is alert.  Psychiatric:        Mood and Affect: Mood normal.        Behavior: Behavior normal.     Results for orders placed or performed during the hospital encounter of 05/15/24 (from the past 24 hours)  Pregnancy, urine POC     Status: None   Collection Time: 05/15/24  1:56 PM  Result Value Ref Range   Preg Test, Ur NEGATIVE NEGATIVE  CBC     Status: Abnormal   Collection Time: 05/15/24  2:01 PM  Result Value Ref Range   WBC 5.7 4.0 - 10.5 K/uL   RBC 3.84 (L) 3.87 - 5.11 MIL/uL   Hemoglobin 11.4 (L) 12.0 - 15.0 g/dL   HCT 62.2 63.9 - 53.9 %   MCV 98.2 80.0 - 100.0 fL   MCH  29.7 26.0 - 34.0 pg   MCHC 30.2 30.0 - 36.0 g/dL   RDW 87.7 88.4 - 84.4 %   Platelets 460 (H) 150 - 400 K/uL   nRBC 0.0 0.0 - 0.2 %    No results found.  Assessment/Plan: [facility] BS.   The risks of surgery were discussed in detail with the patient including but not limited to: bleeding which may require transfusion or reoperation; infection which may require prolonged hospitalization or re-hospitalization and antibiotic  therapy; injury to bowel, bladder, ureters and major vessels or other surrounding organs which may lead to other procedures; formation of adhesions; need for additional procedures including laparotomy or subsequent procedures secondary to intraoperative injury or abnormal pathology; thromboembolic phenomenon; incisional problems and other postoperative or anesthesia complications.  Patient was told that the likelihood that her condition and symptoms will be treated effectively with this surgical management was very high; the postoperative expectations were also discussed in detail. The patient also understands the alternative treatment options which were discussed in full. All questions were answered.    Lynwood Solomons 05/15/2024, 4:16 PM

## 2024-05-15 NOTE — Transfer of Care (Signed)
 Immediate Anesthesia Transfer of Care Note  Patient: [name]  Procedure(s) Performed: HYSTERECTOMY, VAGINAL, WITH RIGHT SALPINGECTOMY (Vagina )  Patient Location: PACU  Anesthesia Type:General  Level of Consciousness: awake, alert , and oriented  Airway & Oxygen Therapy: Patient Spontanous Breathing and Patient connected to face mask oxygen  Post-op Assessment: Report given to RN and Post -op Vital signs reviewed and stable  Post vital signs: Reviewed and stable  Last Vitals:  Vitals Value Taken Time  BP 117/60 05/15/24 19:15  Temp 36.6 C 05/15/24 19:15  Pulse 77 05/15/24 19:18  Resp 20 05/15/24 19:18  SpO2 98 % 05/15/24 19:18  Vitals shown include unfiled device data.  Last Pain:  Vitals:   05/15/24 1347  TempSrc:   PainSc: 5       Patients Stated Pain Goal: 1 (05/15/24 1347)  Complications: No notable events documented.

## 2024-05-15 NOTE — Anesthesia Procedure Notes (Signed)
 Procedure Name: Intubation Date/Time: 05/15/2024 5:08 PM  Performed by: Mollie Olivia SAUNDERS, CRNAPre-anesthesia Checklist: Patient identified, Emergency Drugs available, Suction available and Patient being monitored Patient Re-evaluated:Patient Re-evaluated prior to induction Oxygen Delivery Method: Circle System Utilized Preoxygenation: Pre-oxygenation with 100% oxygen Induction Type: IV induction Ventilation: Mask ventilation without difficulty Laryngoscope Size: Mac and 4 Grade View: Grade I Tube type: Oral Number of attempts: 1 Airway Equipment and Method: Stylet Placement Confirmation: ETT inserted through vocal cords under direct vision, positive ETCO2 and breath sounds checked- equal and bilateral Secured at: 22 cm Tube secured with: Tape Dental Injury: Teeth and Oropharynx as per pre-operative assessment

## 2024-05-15 NOTE — Progress Notes (Signed)
Patient arrived in the unit

## 2024-05-15 NOTE — Anesthesia Preprocedure Evaluation (Addendum)
 Anesthesia Evaluation  Patient identified by MRN, date of birth, ID band Patient awake    Reviewed: Allergy & Precautions, H&P , NPO status , Patient's Chart, lab work & pertinent test results  Airway Mallampati: II   Neck ROM: full    Dental  (+) Dental Advisory Given   Pulmonary neg pulmonary ROS   breath sounds clear to auscultation       Cardiovascular hypertension,  Rhythm:regular Rate:Normal     Neuro/Psych  Headaches PSYCHIATRIC DISORDERS Anxiety     Multiple sclerosis Acute relapsing multiple sclerosis     Neuromuscular disease    GI/Hepatic   Endo/Other    Class 3 obesity  Renal/GU      Musculoskeletal   Abdominal  (+) + obese  Peds  Hematology   Anesthesia Other Findings   Reproductive/Obstetrics                              Anesthesia Physical Anesthesia Plan  ASA: 3  Anesthesia Plan: General   Post-op Pain Management:    Induction: Intravenous  PONV Risk Score and Plan: 3 and Ondansetron , Dexamethasone , Midazolam  and Treatment may vary due to age or medical condition  Airway Management Planned: Oral ETT  Additional Equipment:   Intra-op Plan:   Post-operative Plan: Extubation in OR  Informed Consent: I have reviewed the patients History and Physical, chart, labs and discussed the procedure including the risks, benefits and alternatives for the proposed anesthesia with the patient or authorized representative who has indicated his/her understanding and acceptance.       Plan Discussed with: CRNA, Anesthesiologist and Surgeon  Anesthesia Plan Comments: (  )         Anesthesia Quick Evaluation

## 2024-05-15 NOTE — Op Note (Addendum)
 [name] Staff PROCEDURE DATE: 05/15/2024  PREOPERATIVE DIAGNOSIS:  menorrhagia POSTOPERATIVE DIAGNOSIS:   menorrhagia SURGEON:  [name], MD  ASSISTANT:  Dr. Hildegard Quarry.  An experienced assistant was required given the standard of surgical care given the complexity of the case.  This assistant was needed for exposure, dissection, suctioning, retraction, instrument exchange, and for overall help during the procedure. OPERATION:  Total Vaginal Hysterectomy, right Salpingectomy ANESTHESIA:  General endotracheal.  INDICATIONS: The patient is a 38 y.o. H1E9564 with history of menorrhagia. The patient made a decision to undergo definite surgical treatment. On the preoperative visit, the risks, benefits, indications, and alternatives of the procedure were reviewed with the patient.  On the day of surgery, the risks of surgery were again discussed with the patient including but not limited to: bleeding which may require transfusion or reoperation; infection which may require antibiotics; injury to bowel, bladder, ureters or other surrounding organs; need for additional procedures; thromboembolic phenomenon, incisional problems and other postoperative/anesthesia complications. Written informed consent was obtained.    OPERATIVE FINDINGS: A 5 week size uterus with normal tubes and ovaries bilaterally.  ESTIMATED BLOOD LOSS: 350 ml FLUIDS:  1000 ml of Lactated Ringers , 500 ml albumen  URINE OUTPUT:  250 ml of clear yellow urine. SPECIMENS:  Uterus and cervix**and right fallopian tube sent to pathology COMPLICATIONS:  None immediate.  DESCRIPTION OF PROCEDURE:  The patient received intravenous antibiotics and had sequential compression devices applied to her lower extremities while in the preoperative area.  She was then taken to the operating room where general anesthesia was administered and was found to be adequate.  She was placed in the dorsal lithotomy position, and was prepped and draped in  a sterile manner.  A Foley catheter was inserted into her bladder and attached to constant drainage. After an adequate timeout was performed, attention was turned to her pelvis.  A weighted speculum was then placed in the vagina, and the anterior and posterior lips of the cervix were grasped bilaterally with tenaculums.  The cervix was then injected circumferentially with 0.25% Marcaine  with epinephrine  solution to maintain hemostasis.  The cervix was then circumferentially incised, and the bladder was dissected off the pubocervical fascia anteriorly without complication.  The anterior cul-de-sac was then entered sharply without difficulty and a retractor was placed.  The same procedure was performed posteriorly and the posterior cul-de-sac was entered sharply without difficulty.     A long weighted speculum was inserted into the posterior cul-de-sac.  The Heaney clamp was then used to clamp the uterosacral ligaments on either side.  They were then cut and sutured ligated with 0 Vicryl, and the ligated uterosacral ligaments were transfixed to the ipsilateral vaginal epithelium to further support the vagina and provide hemostasis.   Of note, all sutures used in this case were 0 Vicryl unless otherwise noted.      The cardinal ligaments were then clamped, cut and ligated. The uterine vessels and broad ligaments were then serially clamped with the Heaney clamps, cut, and suture ligated on both sides.  Excellent hemostasis was noted at this point.  The uterus was then delivered via the posterior cul-de-sac, and the cornua were clamped with the Heaney clamps, transected, and the uterus was delivered and sent to pathology. These pedicles were then suture ligated to ensure hemostasis.  The right fallopian tube including the fimbria was clamped, cut and suture ligated. The left tube was not identified.   All pedicles from the uterosacral ligament to the  cornua were examined hemostasis was confirmed.   The vaginal cuff  was then closed with a in a running locked fashion with care given to incorporate the uterosacral pedicles bilaterally.  All instruments were then removed from the pelvis.  The patient tolerated the procedure well.  All instruments, needles, and sponge counts were correct times three.  The patient was extubated successfully, and was taken to the recovery room in stable condition.      [name], MD  Obstetrician & Gynecologist, [facility], [facility]

## 2024-05-16 ENCOUNTER — Encounter (HOSPITAL_COMMUNITY): Payer: Self-pay | Admitting: Obstetrics & Gynecology

## 2024-05-16 ENCOUNTER — Other Ambulatory Visit: Payer: Self-pay

## 2024-05-16 LAB — CBC
HCT: 25.5 % — ABNORMAL LOW (ref 36.0–46.0)
HCT: 29 % — ABNORMAL LOW (ref 36.0–46.0)
Hemoglobin: 8.2 g/dL — ABNORMAL LOW (ref 12.0–15.0)
Hemoglobin: 9.2 g/dL — ABNORMAL LOW (ref 12.0–15.0)
MCH: 30.6 pg (ref 26.0–34.0)
MCH: 30.3 pg (ref 26.0–34.0)
MCHC: 32.2 g/dL (ref 30.0–36.0)
MCHC: 31.7 g/dL (ref 30.0–36.0)
MCV: 95.1 fL (ref 80.0–100.0)
MCV: 95.4 fL (ref 80.0–100.0)
Platelets: 363 K/uL (ref 150–400)
Platelets: 439 K/uL — ABNORMAL HIGH (ref 150–400)
RBC: 2.68 MIL/uL — ABNORMAL LOW (ref 3.87–5.11)
RBC: 3.04 MIL/uL — ABNORMAL LOW (ref 3.87–5.11)
RDW: 12.3 % (ref 11.5–15.5)
RDW: 12.5 % (ref 11.5–15.5)
WBC: 7.9 K/uL (ref 4.0–10.5)
WBC: 7 K/uL (ref 4.0–10.5)
nRBC: 0 % (ref 0.0–0.2)
nRBC: 0 % (ref 0.0–0.2)

## 2024-05-16 MED ORDER — OXYCODONE HCL 5 MG PO TABS: 5.0000 mg | ORAL_TABLET | ORAL | 0 refills | Status: DC | PRN

## 2024-05-16 MED ORDER — ONDANSETRON 4 MG PO TBDP: 4.0000 mg | ORAL_TABLET | Freq: Four times a day (QID) | ORAL | 0 refills | Status: AC | PRN

## 2024-05-16 NOTE — Plan of Care (Signed)
   Problem: Education: Goal: Knowledge of General Education information will improve Description Including pain rating scale, medication(s)/side effects and non-pharmacologic comfort measures Outcome: Progressing

## 2024-05-16 NOTE — TOC Initial Note (Signed)
 Transition of Care [facility]) - Initial/Assessment Note    Patient Details  Name: [name] MRN: [id number] Date of Birth: 02/05/1986  Transition of Care [facility]) CM/SW Contact:    [name] [name] Phone Number: 05/16/2024, 10:32 AM  Clinical Narrative:                 Pt admitted from home with significant other for hysterectomy. No current TOC needs, please consult as needs arise.         Patient Goals and CMS Choice            Expected Discharge Plan and Services                                              Prior Living Arrangements/Services                       Activities of Daily Living   ADL Screening (condition at time of admission) Independently performs ADLs?: Yes (appropriate for developmental age) Is the patient deaf or have difficulty hearing?: No Does the patient have difficulty seeing, even when wearing glasses/contacts?: No Does the patient have difficulty concentrating, remembering, or making decisions?: No  Permission Sought/Granted                  Emotional Assessment              Admission diagnosis:  Dysfunctional uterine bleeding [N93.8] Menorrhagia with regular cycle [N92.0] Patient Active Problem List   Diagnosis Date Noted   Menorrhagia with regular cycle 05/15/2024   DUB (dysfunctional uterine bleeding) 02/16/2024   Hyperglycemia 07/22/2020   Maternal morbid obesity, antepartum (HCC) 07/11/2020   BMI 40.0-44.9, adult (HCC) 07/11/2020   Hx of preeclampsia, prior pregnancy, currently pregnant 05/23/2020   History of reversal of tubal ligation 12/20/2019   Adenomyosis 05/09/2019   Anxiety 11/28/2018   Endometriosis 05/23/2018   Fibroid of cervix    Cervical mass 12/23/2016   Child behavior problem 03/27/2016   Malabsorption 08/29/2013   Acute relapsing multiple sclerosis (HCC) 07/22/2013   B12 deficiency 06/27/2013   Insomnia 06/27/2013   Adjustment disorder with mixed anxiety and depressed mood  03/13/2013   Multiple sclerosis affecting pregnancy in third trimester 03/13/2013   PCP:  Meghan Atlas, MD Pharmacy:   [facility] [id number] - [place], [place] - [address] [place] [address] Phone: [phone] Fax: [phone]     Social Drivers of Health (SDOH) Social History: SDOH Screenings   Food Insecurity: No Food Insecurity (05/16/2024)  Housing: Low Risk  (05/16/2024)  Transportation Needs: No Transportation Needs (05/16/2024)  Utilities: Not At Risk (05/16/2024)  Depression (PHQ2-9): Low Risk  (12/15/2023)  Tobacco Use: Low Risk  (05/16/2024)   SDOH Interventions:     Readmission Risk Interventions     No data to display

## 2024-05-16 NOTE — Progress Notes (Signed)
 1 Day Post-Op Procedure(s): HYSTERECTOMY, VAGINAL, WITH RIGHT SALPINGECTOMY  Subjective: Patient reports tolerating PO and no problems voiding.   Appetite is good and pain is minimal Objective: I have reviewed patient's vital signs, intake and output, and labs. Blood pressure (!) 102/50, pulse 72, temperature 98.4 F (36.9 C), temperature source Oral, resp. rate 16, height 5' 4 (1.626 m), weight 112 kg, last menstrual period 04/23/2024, SpO2 100%.  General: alert, cooperative, and no distress Resp: effort normal GI: soft, non-tender; bowel sounds normal; no masses,  no organomegaly Extremities: extremities normal, atraumatic, no cyanosis or edema  Assessment: s/p Procedure(s) with comments: HYSTERECTOMY, VAGINAL, WITH RIGHT SALPINGECTOMY - [facility] BS: stable  Plan: Encourage ambulation Recheck CBC today due to 8.2 hb this am  LOS: 0 days    Lynwood Solomons, MD 05/16/2024, 8:32 AM

## 2024-05-16 NOTE — Progress Notes (Signed)
 Discharge Nurse Summary: DC order noted per MD. DC RN at bedside with patient/family. Patient agreeable with discharge plan. AVS printed/reviewed. PIV removed, skin intact. No DME needs. No home/TOC meds. CP/Edu resolved. Telemonitor not present on assessment. All belongings accounted for. See LDAs. Patient wheeled downstairs for discharge by private auto.   Rosario EMERSON Lund, RN

## 2024-05-16 NOTE — Care Management Obs Status (Signed)
 MEDICARE OBSERVATION STATUS NOTIFICATION   Patient Details  Name: [name] MRN: [id number] Date of Birth: September 28, 1985   Medicare Observation Status Notification Given:  Yes    [name] 05/16/2024, 12:52 PM

## 2024-05-16 NOTE — Discharge Summary (Signed)
 Gynecology Physician Postoperative Discharge Summary  Patient ID: [name] MRN: [id number] DOB/AGE: 40-Oct-1987 40 y.o.  Admit Date: 05/15/2024 Discharge Date: 05/16/2024  Preoperative Diagnoses: DUB  Procedures: Procedure(s): HYSTERECTOMY, VAGINAL, WITH RIGHT SALPINGECTOMY  Hospital Course:  [name] is a 38 y.o. H1E9564  admitted for scheduled surgery.  She underwent the procedures as mentioned above, her operation was uncomplicated. For further details about surgery, please refer to the operative report. Patient had an uncomplicated postoperative course. By time of discharge on POD#1, her pain was controlled on oral pain medications; she was ambulating, voiding without difficulty, tolerating regular diet and passing flatus. She was deemed stable for discharge to home.   Significant Labs:    Latest Ref Rng & Units 05/16/2024   10:00 AM 05/16/2024    4:58 AM 05/15/2024    2:01 PM  CBC  WBC 4.0 - 10.5 K/uL 7.0  7.9  5.7   Hemoglobin 12.0 - 15.0 g/dL 9.2  8.2  88.5   Hematocrit 36.0 - 46.0 % 29.0  25.5  37.7   Platelets 150 - 400 K/uL 439  363  460     Discharge Exam: Blood pressure 115/61, pulse 76, temperature 98.1 F (36.7 C), resp. rate 18, height 5' 4 (1.626 m), weight 112 kg, last menstrual period 04/23/2024, SpO2 97%. General appearance: alert and no distress  Resp: clear to auscultation bilaterally  Cardio: regular rate and rhythm  GI: soft, non-tender; bowel sounds normal; no masses, no organomegaly.  Incision: C/D/I, no erythema, no drainage noted Pelvic: scant blood on pad  Extremities: extremities normal, atraumatic, no cyanosis or edema and Homans sign is negative, no sign of DVT  Discharged Condition: Stable  Disposition: Discharge disposition: 01-Home or Self Care       Discharge Instructions     Discharge patient   Complete by: As directed    Discharge disposition: 01-Home or Self Care   Discharge patient date: 05/16/2024      Allergies  as of 05/16/2024   No Known Allergies      Medication List     STOP taking these medications    acetaminophen  325 MG tablet Commonly known as: TYLENOL    dicyclomine  20 MG tablet Commonly known as: BENTYL    hydrOXYzine  25 MG tablet Commonly known as: ATARAX    ibuprofen  800 MG tablet Commonly known as: ADVIL    medroxyPROGESTERone  150 MG/ML injection Commonly known as: DEPO-PROVERA    omeprazole  20 MG capsule Commonly known as: PRILOSEC   ondansetron  4 MG disintegrating tablet Commonly known as: ZOFRAN -ODT   oxyCODONE -acetaminophen  10-325 MG tablet Commonly known as: PERCOCET       TAKE these medications    cyanocobalamin  1000 MCG/ML injection Commonly known as: VITAMIN B12 Inject 1,000 mcg into the skin every 30 (thirty) days.   cyclobenzaprine  10 MG tablet Commonly known as: FLEXERIL  Take 5-10 mg by mouth 3 (three) times daily as needed for muscle spasms.   famotidine  20 MG tablet Commonly known as: PEPCID  Take 1 tablet (20 mg total) by mouth 2 (two) times daily. What changed:  when to take this reasons to take this   Ocrevus 300 MG/10ML injection Generic drug: ocrelizumab Inject 300 mg into the vein every 6 (six) months.   oxyCODONE  5 MG immediate release tablet Commonly known as: Oxy IR/ROXICODONE  Take 1-2 tablets (5-10 mg total) by mouth every 4 (four) hours as needed for moderate pain (pain score 4-6).   pantoprazole  40 MG tablet Commonly known as: PROTONIX  Take 40 mg by  mouth daily as needed (acid reflux).   Vitamin D  (Ergocalciferol ) 1.25 MG (50000 UNIT) Caps capsule Commonly known as: DRISDOL  Take 50,000 Units by mouth once a week.        Follow-up Information     [facility] for [facility] Follow up in 3 week(s).   Specialty: Obstetrics and Gynecology Contact information: [address] [place]  [address] [phone]                Signed: [name], MD,  FACOG Obstetrician & Gynecologist Faculty Practice, [facility]

## 2024-05-17 LAB — SURGICAL PATHOLOGY

## 2024-05-18 NOTE — Anesthesia Postprocedure Evaluation (Signed)
 Anesthesia Post Note  Patient: [name]  Procedure(s) Performed: HYSTERECTOMY, VAGINAL, WITH RIGHT SALPINGECTOMY (Vagina )     Patient location during evaluation: PACU Anesthesia Type: General Level of consciousness: awake and alert Pain management: pain level controlled Vital Signs Assessment: post-procedure vital signs reviewed and stable Respiratory status: spontaneous breathing, nonlabored ventilation, respiratory function stable and patient connected to nasal cannula oxygen Cardiovascular status: blood pressure returned to baseline and stable Postop Assessment: no apparent nausea or vomiting Anesthetic complications: no   No notable events documented.                  Alyx Mcguirk

## 2024-05-25 ENCOUNTER — Emergency Department (HOSPITAL_COMMUNITY)

## 2024-05-25 ENCOUNTER — Inpatient Hospital Stay (HOSPITAL_COMMUNITY)
Admission: EM | Admit: 2024-05-25 | Discharge: 2024-06-03 | DRG: 863 | Disposition: A | Discharge status: Home / Self Care | Attending: Obstetrics & Gynecology | Admitting: Obstetrics & Gynecology

## 2024-05-25 ENCOUNTER — Other Ambulatory Visit: Payer: Self-pay

## 2024-05-25 ENCOUNTER — Encounter (HOSPITAL_COMMUNITY): Payer: Self-pay

## 2024-05-25 DIAGNOSIS — R7881 Bacteremia: Secondary | ICD-10-CM | POA: Diagnosis present

## 2024-05-25 DIAGNOSIS — Z743 Need for continuous supervision: Secondary | ICD-10-CM | POA: Diagnosis not present

## 2024-05-25 DIAGNOSIS — N739 Female pelvic inflammatory disease, unspecified: Secondary | ICD-10-CM | POA: Diagnosis present

## 2024-05-25 DIAGNOSIS — R748 Abnormal levels of other serum enzymes: Secondary | ICD-10-CM | POA: Diagnosis present

## 2024-05-25 DIAGNOSIS — K219 Gastro-esophageal reflux disease without esophagitis: Secondary | ICD-10-CM | POA: Diagnosis present

## 2024-05-25 DIAGNOSIS — D510 Vitamin B12 deficiency anemia due to intrinsic factor deficiency: Secondary | ICD-10-CM | POA: Diagnosis present

## 2024-05-25 DIAGNOSIS — Z7401 Bed confinement status: Secondary | ICD-10-CM | POA: Diagnosis not present

## 2024-05-25 DIAGNOSIS — Z6841 Body Mass Index (BMI) 40.0 and over, adult: Secondary | ICD-10-CM

## 2024-05-25 DIAGNOSIS — I82409 Acute embolism and thrombosis of unspecified deep veins of unspecified lower extremity: Secondary | ICD-10-CM | POA: Clinically undetermined

## 2024-05-25 DIAGNOSIS — B9620 Unspecified Escherichia coli [E. coli] as the cause of diseases classified elsewhere: Secondary | ICD-10-CM | POA: Diagnosis present

## 2024-05-25 DIAGNOSIS — R7989 Other specified abnormal findings of blood chemistry: Secondary | ICD-10-CM | POA: Diagnosis present

## 2024-05-25 DIAGNOSIS — R7401 Elevation of levels of liver transaminase levels: Secondary | ICD-10-CM | POA: Diagnosis present

## 2024-05-25 DIAGNOSIS — K567 Ileus, unspecified: Secondary | ICD-10-CM

## 2024-05-25 DIAGNOSIS — E66813 Obesity, class 3: Secondary | ICD-10-CM | POA: Diagnosis present

## 2024-05-25 DIAGNOSIS — G35A Relapsing-remitting multiple sclerosis: Secondary | ICD-10-CM | POA: Diagnosis present

## 2024-05-25 DIAGNOSIS — R188 Other ascites: Principal | ICD-10-CM

## 2024-05-25 DIAGNOSIS — T8143XA Infection following a procedure, organ and space surgical site, initial encounter: Principal | ICD-10-CM | POA: Diagnosis present

## 2024-05-25 DIAGNOSIS — R109 Unspecified abdominal pain: Secondary | ICD-10-CM | POA: Diagnosis present

## 2024-05-25 DIAGNOSIS — Z8249 Family history of ischemic heart disease and other diseases of the circulatory system: Secondary | ICD-10-CM

## 2024-05-25 DIAGNOSIS — K560 Paralytic ileus: Secondary | ICD-10-CM | POA: Diagnosis present

## 2024-05-25 DIAGNOSIS — Z1611 Resistance to penicillins: Secondary | ICD-10-CM | POA: Diagnosis present

## 2024-05-25 DIAGNOSIS — I8291 Chronic embolism and thrombosis of unspecified vein: Secondary | ICD-10-CM | POA: Diagnosis not present

## 2024-05-25 DIAGNOSIS — D6489 Other specified anemias: Secondary | ICD-10-CM | POA: Diagnosis not present

## 2024-05-25 DIAGNOSIS — G35D Multiple sclerosis, unspecified: Secondary | ICD-10-CM | POA: Diagnosis present

## 2024-05-25 DIAGNOSIS — I82890 Acute embolism and thrombosis of other specified veins: Secondary | ICD-10-CM | POA: Diagnosis present

## 2024-05-25 DIAGNOSIS — N179 Acute kidney failure, unspecified: Secondary | ICD-10-CM | POA: Diagnosis present

## 2024-05-25 DIAGNOSIS — I8290 Acute embolism and thrombosis of unspecified vein: Secondary | ICD-10-CM | POA: Diagnosis not present

## 2024-05-25 DIAGNOSIS — R6889 Other general symptoms and signs: Secondary | ICD-10-CM | POA: Diagnosis not present

## 2024-05-25 LAB — COMPREHENSIVE METABOLIC PANEL WITH GFR
ALT: 153 U/L — ABNORMAL HIGH (ref 0–44)
AST: 188 U/L — ABNORMAL HIGH (ref 15–41)
Albumin: 4.3 g/dL (ref 3.5–5.0)
Alkaline Phosphatase: 86 U/L (ref 38–126)
Anion gap: 11 (ref 5–15)
BUN: 9 mg/dL (ref 6–20)
CO2: 23 mmol/L (ref 22–32)
Calcium: 9.3 mg/dL (ref 8.9–10.3)
Chloride: 101 mmol/L (ref 98–111)
Creatinine, Ser: 0.88 mg/dL (ref 0.44–1.00)
GFR, Estimated: >60 mL/min (ref >60)
Glucose, Bld: 106 mg/dL — ABNORMAL HIGH (ref 70–99)
Potassium: 3.9 mmol/L (ref 3.5–5.1)
Sodium: 135 mmol/L (ref 135–145)
Total Bilirubin: 0.7 mg/dL (ref 0.0–1.2)
Total Protein: 7.2 g/dL (ref 6.5–8.1)

## 2024-05-25 LAB — URINALYSIS, ROUTINE W REFLEX MICROSCOPIC
Bacteria, UA: NONE SEEN
Bilirubin Urine: NEGATIVE
Glucose, UA: NEGATIVE mg/dL
Ketones, ur: NEGATIVE mg/dL
Leukocytes,Ua: NEGATIVE
Nitrite: NEGATIVE
Protein, ur: 30 mg/dL — AB
Specific Gravity, Urine: 1.023 (ref 1.005–1.030)
pH: 5 (ref 5.0–8.0)

## 2024-05-25 LAB — CBC
HCT: 29.4 % — ABNORMAL LOW (ref 36.0–46.0)
Hemoglobin: 9.3 g/dL — ABNORMAL LOW (ref 12.0–15.0)
MCH: 29.7 pg (ref 26.0–34.0)
MCHC: 31.6 g/dL (ref 30.0–36.0)
MCV: 93.9 fL (ref 80.0–100.0)
Platelets: 641 K/uL — ABNORMAL HIGH (ref 150–400)
RBC: 3.13 MIL/uL — ABNORMAL LOW (ref 3.87–5.11)
RDW: 12.3 % (ref 11.5–15.5)
WBC: 11.9 K/uL — ABNORMAL HIGH (ref 4.0–10.5)
nRBC: 0 % (ref 0.0–0.2)

## 2024-05-25 LAB — I-STAT CG4 LACTIC ACID, ED: Lactic Acid, Venous: 0.8 mmol/L (ref 0.5–1.9)

## 2024-05-25 LAB — LIPASE, BLOOD: Lipase: 32 U/L (ref 11–51)

## 2024-05-25 LAB — HCG, SERUM, QUALITATIVE: Preg, Serum: NEGATIVE

## 2024-05-25 MED ORDER — GUAIFENESIN 100 MG/5ML PO LIQD
15.0000 mL | ORAL | Status: DC | PRN
  Administered 2024-06-01: 17:00:00 15 mL via ORAL
  Filled 2024-05-25: qty 15

## 2024-05-25 MED ORDER — METRONIDAZOLE 500 MG/100ML IV SOLN
500.0000 mg | Freq: Once | INTRAVENOUS | Status: AC
  Administered 2024-05-25: 500 mg via INTRAVENOUS
  Filled 2024-05-25: qty 100

## 2024-05-25 MED ORDER — IOHEXOL 300 MG/ML  SOLN
100.0000 mL | Freq: Once | INTRAMUSCULAR | Status: AC | PRN
  Administered 2024-05-25: 100 mL via INTRAVENOUS

## 2024-05-25 MED ORDER — POLYETHYLENE GLYCOL 3350 17 G PO PACK
17.0000 g | PACK | Freq: Every day | ORAL | Status: DC | PRN
  Administered 2024-05-27: 17 g via ORAL
  Filled 2024-05-25 (×2): qty 1

## 2024-05-25 MED ORDER — KETOROLAC TROMETHAMINE 30 MG/ML IJ SOLN
30.0000 mg | Freq: Four times a day (QID) | INTRAMUSCULAR | Status: AC
  Administered 2024-05-26 – 2024-05-27 (×8): 30 mg via INTRAVENOUS
  Filled 2024-05-25 (×8): qty 1

## 2024-05-25 MED ORDER — ALUM & MAG HYDROXIDE-SIMETH 200-200-20 MG/5ML PO SUSP: 30.0000 mL | ORAL | Status: DC | PRN

## 2024-05-25 MED ORDER — ONDANSETRON HCL 4 MG/2ML IJ SOLN
4.0000 mg | Freq: Four times a day (QID) | INTRAMUSCULAR | Status: DC | PRN
  Administered 2024-05-25 – 2024-05-28 (×3): 4 mg via INTRAVENOUS
  Filled 2024-05-25 (×2): qty 2

## 2024-05-25 MED ORDER — SODIUM CHLORIDE 0.9 % IV SOLN
2.0000 g | Freq: Once | INTRAVENOUS | Status: AC
  Administered 2024-05-25: 2 g via INTRAVENOUS
  Filled 2024-05-25: qty 12.5

## 2024-05-25 MED ORDER — ONDANSETRON HCL 4 MG PO TABS: 4.0000 mg | ORAL_TABLET | Freq: Four times a day (QID) | ORAL | Status: DC | PRN

## 2024-05-25 MED ORDER — MORPHINE SULFATE (PF) 4 MG/ML IV SOLN
4.0000 mg | Freq: Once | INTRAVENOUS | Status: AC
  Administered 2024-05-25: 4 mg via INTRAVENOUS
  Filled 2024-05-25: qty 1

## 2024-05-25 MED ORDER — MENTHOL 3 MG MT LOZG: 1.0000 | LOZENGE | OROMUCOSAL | Status: DC | PRN

## 2024-05-25 MED ORDER — PANTOPRAZOLE SODIUM 40 MG IV SOLR
40.0000 mg | Freq: Every day | INTRAVENOUS | Status: DC
  Administered 2024-05-26 – 2024-05-29 (×5): 40 mg via INTRAVENOUS
  Filled 2024-05-25 (×5): qty 10

## 2024-05-25 MED ORDER — HYDROMORPHONE HCL 1 MG/ML IJ SOLN
1.0000 mg | Freq: Once | INTRAMUSCULAR | Status: AC
  Administered 2024-05-25: 1 mg via INTRAVENOUS
  Filled 2024-05-25: qty 1

## 2024-05-25 MED ORDER — LACTATED RINGERS IV BOLUS
1000.0000 mL | Freq: Once | INTRAVENOUS | Status: AC
  Administered 2024-05-25: 1000 mL via INTRAVENOUS

## 2024-05-25 MED ORDER — CYCLOBENZAPRINE HCL 10 MG PO TABS
5.0000 mg | ORAL_TABLET | Freq: Three times a day (TID) | ORAL | Status: DC | PRN
  Administered 2024-05-30 – 2024-06-01 (×2): 10 mg via ORAL
  Filled 2024-05-25 (×2): qty 1

## 2024-05-25 MED ORDER — PIPERACILLIN-TAZOBACTAM 3.375 G IVPB
3.3750 g | Freq: Three times a day (TID) | INTRAVENOUS | Status: DC
  Administered 2024-05-26 – 2024-05-31 (×18): 3.375 g via INTRAVENOUS
  Filled 2024-05-25 (×18): qty 50

## 2024-05-25 MED ORDER — KETOROLAC TROMETHAMINE 30 MG/ML IJ SOLN
30.0000 mg | Freq: Four times a day (QID) | INTRAMUSCULAR | Status: AC
  Filled 2024-05-25 (×2): qty 1

## 2024-05-25 MED ORDER — VANCOMYCIN HCL 2000 MG/400ML IV SOLN
2000.0000 mg | Freq: Once | INTRAVENOUS | Status: AC
  Administered 2024-05-25: 2000 mg via INTRAVENOUS
  Filled 2024-05-25: qty 400

## 2024-05-25 MED ORDER — OXYCODONE-ACETAMINOPHEN 5-325 MG PO TABS
1.0000 | ORAL_TABLET | ORAL | Status: DC | PRN
  Administered 2024-05-25: 1 via ORAL
  Filled 2024-05-25: qty 2

## 2024-05-25 MED ORDER — ONDANSETRON HCL 4 MG/2ML IJ SOLN
INTRAMUSCULAR | Status: AC
  Filled 2024-05-25: qty 2

## 2024-05-25 MED ORDER — HYDROMORPHONE HCL 1 MG/ML IJ SOLN
1.0000 mg | INTRAMUSCULAR | Status: DC | PRN
  Administered 2024-05-25 – 2024-05-30 (×8): 1 mg via INTRAVENOUS
  Filled 2024-05-25 (×8): qty 1

## 2024-05-25 MED ORDER — LACTATED RINGERS IV SOLN: INTRAVENOUS | Status: AC

## 2024-05-25 NOTE — ED Notes (Signed)
 US  PIV placed L upper arm, 20g 1.88

## 2024-05-25 NOTE — Progress Notes (Signed)
 ED Pharmacy Antibiotic Sign Off An antibiotic consult was received from an ED provider for Vanco, Cefepime per pharmacy dosing for IAI. A chart review was completed to assess appropriateness.   The following one time order(s) were placed:  Vanco 2g IV x 1 Cefepime 2g IV x 1  Further antibiotic and/or antibiotic pharmacy consults should be ordered by the admitting provider if indicated.   Thank you for allowing pharmacy to be a part of this patient's care.    [name] [name], PharmD, BCPS Clinical Staff Pharmacist [name] Meghan, [name]  Clinical Pharmacist 05/25/2024 7:46 PM

## 2024-05-25 NOTE — ED Triage Notes (Signed)
 Pt bib [facility] from with c/o hysterectomy a week ago, has been having some bleeding, has had abd, vaginal, and anal pain. Pt reports when she stands up things are going tp fall out, reports light headiness when standing. Neg orthostatic with EMS, took prescribed 5 mg oxycodone  for pain with no improvement.

## 2024-05-25 NOTE — ED Provider Notes (Signed)
 [place] EMERGENCY DEPARTMENT AT [facility] Provider Note   CSN: [id number] Arrival date & time: 05/25/24  1358     Patient presents with: Post-op Problem and Vaginal Bleeding   [name] is a 38 y.o. female with history of MS and GERD presents to the emergency department today for evaluation of abdominal pain.  Patient underwent hysterectomy with right salpingectomy on 05-15-2024.  She reports that she has had some vaginal bleeding since then which they report was normal for her.  Reports is around 2 pantiliners per day.  She reports that yesterday she started to feel more pressure in her vagina/pelvic area as well as in her rectum.  She has only had 1 bowel movement since the first.  She reports that she has tried taking stool softeners and laxative but did not have relief and could not have a bowel movement.  She reports that today she took her temperature and it was 100.8 Fahrenheit.  She reports that today she was straining to have a bowel movement when she started to have increase of bleeding from her vagina.  She reports nausea but no vomiting.  She reports that she stopped passing gas yesterday.  She denies any dysuria, hematuria, urgency, or frequency.  She called on-call provider encouraged her to come to the ER.  Surgical history of abdomen includes cholecystectomy, bilateral tubal ligation, tubal reversal, and hysterectomy. NKDA. Denies any tobacco, EtOH, or illicit drug use.    Vaginal Bleeding Associated symptoms: abdominal pain, fever and nausea   Associated symptoms: no dysuria        Prior to Admission medications  Medication Sig Start Date End Date Taking? Authorizing Provider  cyanocobalamin  (VITAMIN B12) 1000 MCG/ML injection Inject 1,000 mcg into the skin every 30 (thirty) days.    [provider]  cyclobenzaprine  (FLEXERIL ) 10 MG tablet Take 5-10 mg by mouth 3 (three) times daily as needed for muscle spasms.    [provider]   famotidine  (PEPCID ) 20 MG tablet Take 1 tablet (20 mg total) by mouth 2 (two) times daily. Patient taking differently: Take 20 mg by mouth daily as needed for heartburn or indigestion. 01/12/24   Geiple, Joshua, PA-C  ocrelizumab (OCREVUS) 300 MG/10ML injection Inject 300 mg into the vein every 6 (six) months.    [provider]  ondansetron  (ZOFRAN -ODT) 4 MG disintegrating tablet Take 1 tablet (4 mg total) by mouth every 6 (six) hours as needed for nausea. 05/16/24   Constant, Peggy, MD  oxyCODONE  (OXY IR/ROXICODONE ) 5 MG immediate release tablet Take 1-2 tablets (5-10 mg total) by mouth every 4 (four) hours as needed for moderate pain (pain score 4-6). 05/16/24   [name], MD  pantoprazole  (PROTONIX ) 40 MG tablet Take 40 mg by mouth daily as needed (acid reflux).    [provider]  Vitamin D , Ergocalciferol , (DRISDOL ) 1.25 MG (50000 UNIT) CAPS capsule Take 50,000 Units by mouth once a week.    [provider]    Allergies: Patient has no known allergies.    Review of Systems  Constitutional:  Positive for fever. Negative for chills.  Respiratory:  Negative for shortness of breath.   Cardiovascular:  Negative for chest pain.  Gastrointestinal:  Positive for abdominal pain, constipation and nausea. Negative for diarrhea and vomiting.  Genitourinary:  Positive for pelvic pain and vaginal bleeding. Negative for dysuria and hematuria.    Updated Vital Signs BP 137/68 (BP Location: Right Arm)   Pulse 82  Temp 99 F (37.2 C) (Oral)   Resp 16   LMP 04/23/2024 (Approximate)   SpO2 100%   Physical Exam Constitutional:      General: She is not in acute distress.    Appearance: She is not toxic-appearing.  Eyes:     General: No scleral icterus. Cardiovascular:     Rate and Rhythm: Normal rate.  Pulmonary:     Effort: Pulmonary effort is normal. No respiratory distress.  Abdominal:     Palpations: Abdomen is soft.     Tenderness: There is abdominal  tenderness. There is no guarding or rebound.     Comments: Diffuse abdominal tenderness to palpation. Soft.   Skin:    General: Skin is warm and dry.  Neurological:     Mental Status: She is alert.     (all labs ordered are listed, but only abnormal results are displayed) Labs Reviewed  COMPREHENSIVE METABOLIC PANEL WITH GFR - Abnormal; Notable for the following components:      Result Value   Glucose, Bld 106 (*)    AST 188 (*)    ALT 153 (*)    All other components within normal limits  CBC - Abnormal; Notable for the following components:   WBC 11.9 (*)    RBC 3.13 (*)    Hemoglobin 9.3 (*)    HCT 29.4 (*)    Platelets 641 (*)    All other components within normal limits  URINALYSIS, ROUTINE W REFLEX MICROSCOPIC - Abnormal; Notable for the following components:   Hgb urine dipstick SMALL (*)    Protein, ur 30 (*)    All other components within normal limits  CULTURE, BLOOD (ROUTINE X 2)  CULTURE, BLOOD (ROUTINE X 2)  LIPASE, BLOOD  HCG, SERUM, QUALITATIVE  I-STAT CG4 LACTIC ACID, ED    EKG: None  Radiology: CT ABDOMEN PELVIS W CONTRAST Result Date: 05/25/2024 EXAM: CT ABDOMEN AND PELVIS WITH CONTRAST 05/25/2024 07:14:00 PM TECHNIQUE: CT of the abdomen and pelvis was performed with the administration of 100 mL of iohexol  (OMNIPAQUE ) 300 MG/ML solution. Multiplanar reformatted images are provided for review. Automated exposure control, iterative reconstruction, and/or weight-based adjustment of the mA/kV was utilized to reduce the radiation dose to as low as reasonably achievable. COMPARISON: 03/09/2024 CLINICAL HISTORY: Abdominal pain, post-op; Bowel obstruction suspected. FINDINGS: LOWER CHEST: No acute abnormality. LIVER: The liver is unremarkable. GALLBLADDER AND BILE DUCTS: Post cholecystectomy. No biliary ductal dilatation. SPLEEN: No acute abnormality. PANCREAS: No acute abnormality. ADRENAL GLANDS: No acute abnormality. KIDNEYS, URETERS AND BLADDER: No stones in the  kidneys or ureters. No hydronephrosis. No perinephric or periureteral stranding. Urinary bladder is unremarkable. GI AND BOWEL: Stomach demonstrates no acute abnormality. Gaseous distension of colon, likely reflecting adynamic ileus in this clinical setting. There is no bowel obstruction. PERITONEUM AND RETROPERITONEUM: 6.4 x 4.0 cm bilobed pelvic fluid collection, with surrounding pelvic inflammatory changes, raising concern for abscess. Secondary involvement of the rectosigmoid colon. High density within the collection (image 78) at least raises the possibility of foreign body. No free air. VASCULATURE: Aorta is normal in caliber. LYMPH NODES: No lymphadenopathy. REPRODUCTIVE ORGANS: No acute abnormality. BONES AND SOFT TISSUES: No acute osseous abnormality. Right abdominal wall lipoma. IMPRESSION: 1. Status post hysterectomy. 2. 6.4 cm pelvic fluid collection, concerning for abscess. 3. Although equivocal, high density within the collection raises the possibility of a foreign body. 4. Secondary involvement of the rectosigmoid colon with suspected adynamic colonic ileus. 5. These results were called by telephone at the  time of interpretation on 05/25/2024 at 1930 hrs to provider Dr Bari. Electronically signed by: Pinkie Pebbles MD 05/25/2024 07:35 PM EST RP Workstation: [id number]   Procedures   Medications Ordered in the ED  metroNIDAZOLE  (FLAGYL ) IVPB 500 mg (has no administration in time range)  vancomycin (VANCOREADY) IVPB 2000 mg/400 mL (has no administration in time range)  ceFEPIme (MAXIPIME) 2 g in sodium chloride  0.9 % 100 mL IVPB (has no administration in time range)  lactated ringers  bolus 1,000 mL (1,000 mLs Intravenous New Bag/Given 05/25/24 1857)  iohexol  (OMNIPAQUE ) 300 MG/ML solution 100 mL (100 mLs Intravenous Contrast Given 05/25/24 1902)  morphine  (PF) 4 MG/ML injection 4 mg (4 mg Intravenous Given 05/25/24 1917)    Clinical Course as of 05/25/24 1955  Thu May 25, 2024  1938  Attempted to call GYN without answer. [RR]  1945 Dr. Fredirick will put in orders for admission to [facility]. [RR]    Clinical Course User Index [RR] Bernis Ernst, PA-C   Medical Decision Making Amount and/or Complexity of Data Reviewed Labs: ordered. Radiology: ordered.  Risk Prescription drug management. Decision regarding hospitalization.   38 y.o. female presents to the ER for evaluation of lower abdominal pain post hysterectomy. Differential diagnosis includes but is not limited to postop complication, AAA, mesenteric ischemia, appendicitis, diverticulitis, DKA, gastroenteritis, nephrolithiasis, pancreatitis, constipation, UTI, bowel obstruction, biliary disease, IBD, PUD, hepatitis. Vital signs unremarkable. Physical exam as noted above.   On 05-15-2024, patient underwent hysterectomy with right salpingectomy.  She has some diffuse abdominal tenderness but mainly to the suprapubic region.  She reports that she has had a small amount of bleeding since the procedure however with hard straining today try to have a bowel movement, she did have increase of vaginal bleeding.  She also ports that she had a fever of 100.8 today however is afebrile here.  Will obtain CT imaging and lab work.  I independently reviewed and interpreted the patient's labs.  CMP shows glucose of 106, mild transaminitis with AST 188, ALT at 153 but no elevations or alk phos or total bili.  No other electrolyte or LFT abnormality.  Lipase within normal limits.  CBC does show slight leukocytosis at 11.9 with a hemoglobin of 9.3.  Platelets are elevated at 641.  Anemia is at her baseline.  Appears her platelets are usually in the 400s.  hCG is negative which is to be expected.  Urinalysis shows small amount of hemoglobin, 30 protein with 21-50 red blood cells but no white blood cells or bacteria.  Lactic acid pending.  Blood cultures pending  CT shows 1. Status post hysterectomy. 2. 6.4 cm pelvic fluid collection,  concerning for abscess. 3. Although equivocal, high density within the collection raises the possibility of a foreign body. 4. Secondary involvement of the rectosigmoid colon with suspected adynamic colonic ileus. 5. These results were called by telephone at the time of interpretation on 05/25/2024 at 1930 hrs to provider Dr Bari. Per radiologist's interpretation.    Given imaging findings, started the patient on broad-spectrum antibiotics.  Page out to gynecology.  I spoke with Dr. Fredirick and explained the patient's presentation, physical exam, as well as CT findings.  I do not feel comfortable doing a pelvic examination on the patient especially if did dislodge some of her suturing from her hysterectomy which is causing some of her bleeding.  Discussed this with Dr. Fredirick as well.  She will admit the patient over to [facility].  Portions of this  report may have been transcribed using voice recognition software. Every effort was made to ensure accuracy; however, inadvertent computerized transcription errors may be present.    Final diagnoses:  Intraabdominal fluid collection  [facility])    ED Discharge Orders     None          Bernis Ernst, PA-C 05/25/24 2005    Bari Roxie HERO, [place] 05/25/24 2237

## 2024-05-25 NOTE — H&P (Incomplete)
 [name] is an 38 y.o. [phone] female.   Chief Complaint: Abdominal pain HPI: Patient is a 38 year old who is postop day #10 status post transvaginal hysterectomy with right salpingectomy, who reports increasing vaginal and pelvic pressure as well as pain in her rectum.  She reports significant constipation having had only 1 bowel movement since surgery.  While she was straining she had increased pain and pressure in her vagina as well as new onset of vaginal bleeding.  She was seen in the ED where workup included a CT that showed probable pelvic abscess.  She was noted to have elevated WBC and mild low-grade fever.  She had a normal lactic acid.  Her hemoglobin was stable at 9.3.  Past Medical History:  Diagnosis Date   Abnormal Pap smear    Colpo>normal   Anxiety    Cholestasis during pregnancy in third trimester 07/12/2020   07/11/20 Bile acids 13.8     Fibroid    GERD (gastroesophageal reflux disease)    Gonorrhea    Headache    last migraine 01/2017   History of IBS    watch diet, no meds   Infection    UTI   Multiple sclerosis    Neuromuscular disorder (HCC)    diagnosed with MS this visit   Obesity    Ovarian cyst    Pernicious anemia 05/02/2013   Pregnancy induced hypertension    Preterm labor    all 3 deliveries   SVD (spontaneous vaginal delivery)    x 3   Urinary tract infection    Vaginal Pap smear, abnormal     Past Surgical History:  Procedure Laterality Date   CHOLECYSTECTOMY N/A 11/25/2017   Procedure: LAPAROSCOPIC CHOLECYSTECTOMY WITH INTRAOPERATIVE CHOLANGIOGRAM;  Surgeon: Eletha Boas, MD;  Location: [facility] ORS;  Service: General;  Laterality: N/A;   COLPOSCOPY     HYSTERECTOMY, VAGINAL, WITH SALPINGECTOMY  05/15/2024   Procedure: HYSTERECTOMY, VAGINAL, WITH RIGHT SALPINGECTOMY;  Surgeon: [name], MD;  [facility];  Service: Gynecology;;  [facility] BS   MYOMECTOMY N/A 04/01/2017   Procedure: Vaginal Myomectomy;  Surgeon:  Herchel Gloris LABOR, MD;  Location: [facility] ORS;  Service: Gynecology;  Laterality: N/A;   TUBAL LIGATION     2012/ reversal June 2020   WISDOM TOOTH EXTRACTION      Family History  Problem Relation Age of Onset   Diabetes Mother    Hypertension Mother    Diabetes Father    Hypertension Father    Kidney disease Father    Stroke Father    Learning disabilities Brother    Anesthesia problems Neg Hx    Other Neg Hx    Social History:  reports that she has never smoked. She has never used smokeless tobacco. She reports that she does not drink alcohol and does not use drugs.  Allergies: Allergies[1]  Medications Prior to Admission  Medication Sig Dispense Refill   cyanocobalamin  (VITAMIN B12) 1000 MCG/ML injection Inject 1,000 mcg into the skin every 30 (thirty) days.     cyclobenzaprine  (FLEXERIL ) 10 MG tablet Take 5-10 mg by mouth 3 (three) times daily as needed for muscle spasms.     famotidine  (PEPCID ) 20 MG tablet Take 1 tablet (20 mg total) by mouth 2 (two) times daily. (Patient taking differently: Take 20 mg by mouth daily as needed for heartburn or indigestion.) 20 tablet 0   ocrelizumab (OCREVUS) 300 MG/10ML injection Inject 300 mg into the vein every 6 (six) months.  ondansetron  (ZOFRAN -ODT) 4 MG disintegrating tablet Take 1 tablet (4 mg total) by mouth every 6 (six) hours as needed for nausea. 20 tablet 0   oxyCODONE  (OXY IR/ROXICODONE ) 5 MG immediate release tablet Take 1-2 tablets (5-10 mg total) by mouth every 4 (four) hours as needed for moderate pain (pain score 4-6). 30 tablet 0   pantoprazole  (PROTONIX ) 40 MG tablet Take 40 mg by mouth daily as needed (acid reflux).     Vitamin D , Ergocalciferol , (DRISDOL ) 1.25 MG (50000 UNIT) CAPS capsule Take 50,000 Units by mouth once a week.      Pertinent items are noted in HPI.  Blood pressure 122/64, pulse 94, temperature 99.6 F (37.6 C), temperature source Oral, resp. rate 16, last menstrual period 04/23/2024,  SpO2 96%. General appearance: {general exam:16600} Head: {head exam:30909::Normocephalic, without obvious abnormality,atraumatic} Neck: {neck exam:17463::no adenopathy,no carotid bruit,no JVD,supple, symmetrical, trachea midline,thyroid not enlarged, symmetric, no tenderness/mass/nodules} Lungs: {lung exam:16931} Heart: {heart exam:5510} Abdomen: {abdominal exam:16834} Extremities: {extremity exam:5109} Neurologic: {neuro exam:17854}   Results for orders placed or performed during the hospital encounter of 05/25/24 (from the past 24 hours)  Urinalysis, Routine w reflex microscopic -Urine, Clean Catch     Status: Abnormal   Collection Time: 05/25/24  2:21 PM  Result Value Ref Range   Color, Urine YELLOW YELLOW   APPearance CLEAR CLEAR   Specific Gravity, Urine 1.023 1.005 - 1.030   pH 5.0 5.0 - 8.0   Glucose, UA NEGATIVE NEGATIVE mg/dL   Hgb urine dipstick SMALL (A) NEGATIVE   Bilirubin Urine NEGATIVE NEGATIVE   Ketones, ur NEGATIVE NEGATIVE mg/dL   Protein, ur 30 (A) NEGATIVE mg/dL   Nitrite NEGATIVE NEGATIVE   Leukocytes,Ua NEGATIVE NEGATIVE   RBC / HPF 21-50 0 - 5 RBC/hpf   WBC, UA 0-5 0 - 5 WBC/hpf   Bacteria, UA NONE SEEN NONE SEEN   Squamous Epithelial / HPF 0-5 0 - 5 /HPF   Mucus PRESENT   Lipase, blood     Status: None   Collection Time: 05/25/24  2:25 PM  Result Value Ref Range   Lipase 32 11 - 51 U/L  Comprehensive metabolic panel     Status: Abnormal   Collection Time: 05/25/24  2:25 PM  Result Value Ref Range   Sodium 135 135 - 145 mmol/L   Potassium 3.9 3.5 - 5.1 mmol/L   Chloride 101 98 - 111 mmol/L   CO2 23 22 - 32 mmol/L   Glucose, Bld 106 (H) 70 - 99 mg/dL   BUN 9 6 - 20 mg/dL   Creatinine, Ser 9.11 0.44 - 1.00 mg/dL   Calcium  9.3 8.9 - 10.3 mg/dL   Total Protein 7.2 6.5 - 8.1 g/dL   Albumin  4.3 3.5 - 5.0 g/dL   AST 811 (H) 15 - 41 U/L   ALT 153 (H) 0 - 44 U/L   Alkaline Phosphatase 86 38 - 126 U/L   Total Bilirubin 0.7 0.0 - 1.2 mg/dL    GFR, Estimated >39 >39 mL/min   Anion gap 11 5 - 15  CBC     Status: Abnormal   Collection Time: 05/25/24  2:25 PM  Result Value Ref Range   WBC 11.9 (H) 4.0 - 10.5 K/uL   RBC 3.13 (L) 3.87 - 5.11 MIL/uL   Hemoglobin 9.3 (L) 12.0 - 15.0 g/dL   HCT 70.5 (L) 63.9 - 53.9 %   MCV 93.9 80.0 - 100.0 fL   MCH 29.7 26.0 - 34.0 pg   MCHC  31.6 30.0 - 36.0 g/dL   RDW 87.6 88.4 - 84.4 %   Platelets 641 (H) 150 - 400 K/uL   nRBC 0.0 0.0 - 0.2 %  hCG, serum, qualitative     Status: None   Collection Time: 05/25/24  2:25 PM  Result Value Ref Range   Preg, Serum NEGATIVE NEGATIVE  I-Stat CG4 Lactic Acid     Status: None   Collection Time: 05/25/24  8:12 PM  Result Value Ref Range   Lactic Acid, Venous 0.8 0.5 - 1.9 mmol/L   CT ABDOMEN PELVIS W CONTRAST Result Date: 05/25/2024 EXAM: CT ABDOMEN AND PELVIS WITH CONTRAST 05/25/2024 07:14:00 PM TECHNIQUE: CT of the abdomen and pelvis was performed with the administration of 100 mL of iohexol  (OMNIPAQUE ) 300 MG/ML solution. Multiplanar reformatted images are provided for review. Automated exposure control, iterative reconstruction, and/or weight-based adjustment of the mA/kV was utilized to reduce the radiation dose to as low as reasonably achievable. COMPARISON: 03/09/2024 CLINICAL HISTORY: Abdominal pain, post-op; Bowel obstruction suspected. FINDINGS: LOWER CHEST: No acute abnormality. LIVER: The liver is unremarkable. GALLBLADDER AND BILE DUCTS: Post cholecystectomy. No biliary ductal dilatation. SPLEEN: No acute abnormality. PANCREAS: No acute abnormality. ADRENAL GLANDS: No acute abnormality. KIDNEYS, URETERS AND BLADDER: No stones in the kidneys or ureters. No hydronephrosis. No perinephric or periureteral stranding. Urinary bladder is unremarkable. GI AND BOWEL: Stomach demonstrates no acute abnormality. Gaseous distension of colon, likely reflecting adynamic ileus in this clinical setting. There is no bowel obstruction. PERITONEUM AND  RETROPERITONEUM: 6.4 x 4.0 cm bilobed pelvic fluid collection, with surrounding pelvic inflammatory changes, raising concern for abscess. Secondary involvement of the rectosigmoid colon. High density within the collection (image 78) at least raises the possibility of foreign body. No free air. VASCULATURE: Aorta is normal in caliber. LYMPH NODES: No lymphadenopathy. REPRODUCTIVE ORGANS: No acute abnormality. BONES AND SOFT TISSUES: No acute osseous abnormality. Right abdominal wall lipoma. IMPRESSION: 1. Status post hysterectomy. 2. 6.4 cm pelvic fluid collection, concerning for abscess. 3. Although equivocal, high density within the collection raises the possibility of a foreign body. 4. Secondary involvement of the rectosigmoid colon with suspected adynamic colonic ileus. 5. These results were called by telephone at the time of interpretation on 05/25/2024 at 1930 hrs to provider Dr Bari. Electronically signed by: Pinkie Pebbles MD 05/25/2024 07:35 PM EST RP Workstation: [id number]    Assessment/Plan Principal Problem:   Pelvic abscess in female Active Problems:   Acute relapsing multiple sclerosis (HCC)  IV Zosyn Repeat labs in the a.m. Pain control Discuss case with primary surgeon i.e. retained foreign body     Glenys GORMAN Birk 05/25/2024, 11:31 PM          [1] No Known Allergies

## 2024-05-25 NOTE — H&P (Signed)
 [name] is an 38 y.o. [phone] female.   Chief Complaint: Abdominal pain HPI: Patient is a 38 year old who is postop day #10 status post transvaginal hysterectomy with right salpingectomy, who reports increasing vaginal and pelvic pressure as well as pain in her rectum.  She reports significant constipation having had only 1 bowel movement since surgery.  While she was straining she had increased pain and pressure in her vagina as well as new onset of vaginal bleeding.  She was seen in the ED where workup included a CT that showed probable pelvic abscess.  She was noted to have elevated WBC and mild low-grade fever.  She had a normal lactic acid.  Her hemoglobin was stable at 9.3.  Past Medical History:  Diagnosis Date   Abnormal Pap smear    Colpo>normal   Anxiety    Cholestasis during pregnancy in third trimester 07/12/2020   07/11/20 Bile acids 13.8     Fibroid    GERD (gastroesophageal reflux disease)    Gonorrhea    Headache    last migraine 01/2017   History of IBS    watch diet, no meds   Infection    UTI   Multiple sclerosis    Neuromuscular disorder (HCC)    diagnosed with MS this visit   Obesity    Ovarian cyst    Pernicious anemia 05/02/2013   Pregnancy induced hypertension    Preterm labor    all 3 deliveries   SVD (spontaneous vaginal delivery)    x 3   Urinary tract infection    Vaginal Pap smear, abnormal     Past Surgical History:  Procedure Laterality Date   CHOLECYSTECTOMY N/A 11/25/2017   Procedure: LAPAROSCOPIC CHOLECYSTECTOMY WITH INTRAOPERATIVE CHOLANGIOGRAM;  Surgeon: Eletha Boas, MD;  Location: [facility];  Service: General;  Laterality: N/A;   COLPOSCOPY     HYSTERECTOMY, VAGINAL, WITH SALPINGECTOMY  05/15/2024   Procedure: HYSTERECTOMY, VAGINAL, WITH RIGHT SALPINGECTOMY;  Surgeon: [name], MD;  Location: [facility] OR;  Service: Gynecology;;  [facility] BS   MYOMECTOMY N/A 04/01/2017   Procedure: Vaginal Myomectomy;  Surgeon: Herchel Gloris LABOR, MD;   Location: [facility];  Service: Gynecology;  Laterality: N/A;   TUBAL LIGATION     2012/ reversal June 2020   WISDOM TOOTH EXTRACTION      Family History  Problem Relation Age of Onset   Diabetes Mother    Hypertension Mother    Diabetes Father    Hypertension Father    Kidney disease Father    Stroke Father    Learning disabilities Brother    Anesthesia problems Neg Hx    Other Neg Hx    Social History:  reports that she has never smoked. She has never used smokeless tobacco. She reports that she does not drink alcohol and does not use drugs.  Allergies: Allergies[1]  Medications Prior to Admission  Medication Sig Dispense Refill   cyanocobalamin  (VITAMIN B12) 1000 MCG/ML injection Inject 1,000 mcg into the skin every 30 (thirty) days.     cyclobenzaprine  (FLEXERIL ) 10 MG tablet Take 5-10 mg by mouth 3 (three) times daily as needed for muscle spasms.     famotidine  (PEPCID ) 20 MG tablet Take 1 tablet (20 mg total) by mouth 2 (two) times daily. (Patient taking differently: Take 20 mg by mouth daily as needed for heartburn or indigestion.) 20 tablet 0   ocrelizumab (OCREVUS) 300 MG/10ML injection Inject 300 mg into the vein every 6 (six) months.  ondansetron  (ZOFRAN -ODT) 4 MG disintegrating tablet Take 1 tablet (4 mg total) by mouth every 6 (six) hours as needed for nausea. 20 tablet 0   oxyCODONE  (OXY IR/ROXICODONE ) 5 MG immediate release tablet Take 1-2 tablets (5-10 mg total) by mouth every 4 (four) hours as needed for moderate pain (pain score 4-6). 30 tablet 0   pantoprazole  (PROTONIX ) 40 MG tablet Take 40 mg by mouth daily as needed (acid reflux).     Vitamin D , Ergocalciferol , (DRISDOL ) 1.25 MG (50000 UNIT) CAPS capsule Take 50,000 Units by mouth once a week.      Pertinent items are noted in HPI.  Blood pressure 122/64, pulse 94, temperature 99.6 F (37.6 C), temperature source Oral, resp. rate 16, last menstrual period 04/23/2024, SpO2 96%. General appearance: alert,  cooperative, and appears stated age Head: Normocephalic, without obvious abnormality, atraumatic Neck: supple, symmetrical, trachea midline Lungs: normal effort Heart: regular rate and rhythm Abdomen: soft, tender lower abdomen Extremities: Homans sign is negative, no sign of DVT Neurologic: Grossly normal   Results for orders placed or performed during the hospital encounter of 05/25/24 (from the past 24 hours)  Urinalysis, Routine w reflex microscopic -Urine, Clean Catch     Status: Abnormal   Collection Time: 05/25/24  2:21 PM  Result Value Ref Range   Color, Urine YELLOW YELLOW   APPearance CLEAR CLEAR   Specific Gravity, Urine 1.023 1.005 - 1.030   pH 5.0 5.0 - 8.0   Glucose, UA NEGATIVE NEGATIVE mg/dL   Hgb urine dipstick SMALL (A) NEGATIVE   Bilirubin Urine NEGATIVE NEGATIVE   Ketones, ur NEGATIVE NEGATIVE mg/dL   Protein, ur 30 (A) NEGATIVE mg/dL   Nitrite NEGATIVE NEGATIVE   Leukocytes,Ua NEGATIVE NEGATIVE   RBC / HPF 21-50 0 - 5 RBC/hpf   WBC, UA 0-5 0 - 5 WBC/hpf   Bacteria, UA NONE SEEN NONE SEEN   Squamous Epithelial / HPF 0-5 0 - 5 /HPF   Mucus PRESENT   Lipase, blood     Status: None   Collection Time: 05/25/24  2:25 PM  Result Value Ref Range   Lipase 32 11 - 51 U/L  Comprehensive metabolic panel     Status: Abnormal   Collection Time: 05/25/24  2:25 PM  Result Value Ref Range   Sodium 135 135 - 145 mmol/L   Potassium 3.9 3.5 - 5.1 mmol/L   Chloride 101 98 - 111 mmol/L   CO2 23 22 - 32 mmol/L   Glucose, Bld 106 (H) 70 - 99 mg/dL   BUN 9 6 - 20 mg/dL   Creatinine, Ser 9.11 0.44 - 1.00 mg/dL   Calcium  9.3 8.9 - 10.3 mg/dL   Total Protein 7.2 6.5 - 8.1 g/dL   Albumin  4.3 3.5 - 5.0 g/dL   AST 811 (H) 15 - 41 U/L   ALT 153 (H) 0 - 44 U/L   Alkaline Phosphatase 86 38 - 126 U/L   Total Bilirubin 0.7 0.0 - 1.2 mg/dL   GFR, Estimated >39 >39 mL/min   Anion gap 11 5 - 15  CBC     Status: Abnormal   Collection Time: 05/25/24  2:25 PM  Result Value Ref Range    WBC 11.9 (H) 4.0 - 10.5 K/uL   RBC 3.13 (L) 3.87 - 5.11 MIL/uL   Hemoglobin 9.3 (L) 12.0 - 15.0 g/dL   HCT 70.5 (L) 63.9 - 53.9 %   MCV 93.9 80.0 - 100.0 fL   MCH 29.7 26.0 - 34.0  pg   MCHC 31.6 30.0 - 36.0 g/dL   RDW 87.6 88.4 - 84.4 %   Platelets 641 (H) 150 - 400 K/uL   nRBC 0.0 0.0 - 0.2 %  hCG, serum, qualitative     Status: None   Collection Time: 05/25/24  2:25 PM  Result Value Ref Range   Preg, Serum NEGATIVE NEGATIVE  I-Stat CG4 Lactic Acid     Status: None   Collection Time: 05/25/24  8:12 PM  Result Value Ref Range   Lactic Acid, Venous 0.8 0.5 - 1.9 mmol/L   CT ABDOMEN PELVIS W CONTRAST Result Date: 05/25/2024 EXAM: CT ABDOMEN AND PELVIS WITH CONTRAST 05/25/2024 07:14:00 PM TECHNIQUE: CT of the abdomen and pelvis was performed with the administration of 100 mL of iohexol  (OMNIPAQUE ) 300 MG/ML solution. Multiplanar reformatted images are provided for review. Automated exposure control, iterative reconstruction, and/or weight-based adjustment of the mA/kV was utilized to reduce the radiation dose to as low as reasonably achievable. COMPARISON: 03/09/2024 CLINICAL HISTORY: Abdominal pain, post-op; Bowel obstruction suspected. FINDINGS: LOWER CHEST: No acute abnormality. LIVER: The liver is unremarkable. GALLBLADDER AND BILE DUCTS: Post cholecystectomy. No biliary ductal dilatation. SPLEEN: No acute abnormality. PANCREAS: No acute abnormality. ADRENAL GLANDS: No acute abnormality. KIDNEYS, URETERS AND BLADDER: No stones in the kidneys or ureters. No hydronephrosis. No perinephric or periureteral stranding. Urinary bladder is unremarkable. GI AND BOWEL: Stomach demonstrates no acute abnormality. Gaseous distension of colon, likely reflecting adynamic ileus in this clinical setting. There is no bowel obstruction. PERITONEUM AND RETROPERITONEUM: 6.4 x 4.0 cm bilobed pelvic fluid collection, with surrounding pelvic inflammatory changes, raising concern for abscess. Secondary involvement  of the rectosigmoid colon. High density within the collection (image 78) at least raises the possibility of foreign body. No free air. VASCULATURE: Aorta is normal in caliber. LYMPH NODES: No lymphadenopathy. REPRODUCTIVE ORGANS: No acute abnormality. BONES AND SOFT TISSUES: No acute osseous abnormality. Right abdominal wall lipoma. IMPRESSION: 1. Status post hysterectomy. 2. 6.4 cm pelvic fluid collection, concerning for abscess. 3. Although equivocal, high density within the collection raises the possibility of a foreign body. 4. Secondary involvement of the rectosigmoid colon with suspected adynamic colonic ileus. 5. These results were called by telephone at the time of interpretation on 05/25/2024 at 1930 hrs to provider Dr Bari. Electronically signed by: Pinkie Pebbles MD 05/25/2024 07:35 PM EST RP Workstation: [id number]    Assessment/Plan Principal Problem:   Pelvic abscess in female Active Problems:   Acute relapsing multiple sclerosis (HCC)   Adynamic ileus (HCC)  IV Zosyn Repeat labs in the a.m. Pain control Discuss case with primary surgeon i.e. retained foreign body Consider general surgery consultation in am Consider IR consultation for drainage.     Glenys GORMAN Birk 05/25/2024, 11:31 PM        [1] No Known Allergies

## 2024-05-26 ENCOUNTER — Encounter (HOSPITAL_COMMUNITY): Payer: Self-pay | Admitting: Family Medicine

## 2024-05-26 ENCOUNTER — Inpatient Hospital Stay (HOSPITAL_COMMUNITY)

## 2024-05-26 DIAGNOSIS — R7989 Other specified abnormal findings of blood chemistry: Secondary | ICD-10-CM | POA: Diagnosis present

## 2024-05-26 DIAGNOSIS — K560 Paralytic ileus: Secondary | ICD-10-CM | POA: Diagnosis present

## 2024-05-26 LAB — COMPREHENSIVE METABOLIC PANEL WITH GFR
ALT: 159 U/L — ABNORMAL HIGH (ref 0–44)
AST: 142 U/L — ABNORMAL HIGH (ref 15–41)
Albumin: 2.8 g/dL — ABNORMAL LOW (ref 3.5–5.0)
Alkaline Phosphatase: 73 U/L (ref 38–126)
Anion gap: 8 (ref 5–15)
BUN: 8 mg/dL (ref 6–20)
CO2: 23 mmol/L (ref 22–32)
Calcium: 8.2 mg/dL — ABNORMAL LOW (ref 8.9–10.3)
Chloride: 102 mmol/L (ref 98–111)
Creatinine, Ser: 1.09 mg/dL — ABNORMAL HIGH (ref 0.44–1.00)
GFR, Estimated: >60 mL/min (ref >60)
Glucose, Bld: 109 mg/dL — ABNORMAL HIGH (ref 70–99)
Potassium: 3.9 mmol/L (ref 3.5–5.1)
Sodium: 133 mmol/L — ABNORMAL LOW (ref 135–145)
Total Bilirubin: 1.4 mg/dL — ABNORMAL HIGH (ref 0.0–1.2)
Total Protein: 5.6 g/dL — ABNORMAL LOW (ref 6.5–8.1)

## 2024-05-26 LAB — CBC WITH DIFFERENTIAL/PLATELET
Abs Immature Granulocytes: 0.11 K/uL — ABNORMAL HIGH (ref 0.00–0.07)
Basophils Absolute: 0.1 K/uL (ref 0.0–0.1)
Basophils Relative: 0 %
Eosinophils Absolute: 0 K/uL (ref 0.0–0.5)
Eosinophils Relative: 0 %
HCT: 25.5 % — ABNORMAL LOW (ref 36.0–46.0)
Hemoglobin: 8.2 g/dL — ABNORMAL LOW (ref 12.0–15.0)
Immature Granulocytes: 1 %
Lymphocytes Relative: 6 %
Lymphs Abs: 1.2 K/uL (ref 0.7–4.0)
MCH: 30 pg (ref 26.0–34.0)
MCHC: 32.2 g/dL (ref 30.0–36.0)
MCV: 93.4 fL (ref 80.0–100.0)
Monocytes Absolute: 1.4 K/uL — ABNORMAL HIGH (ref 0.1–1.0)
Monocytes Relative: 7 %
Neutro Abs: 17 K/uL — ABNORMAL HIGH (ref 1.7–7.7)
Neutrophils Relative %: 86 %
Platelets: 535 K/uL — ABNORMAL HIGH (ref 150–400)
RBC: 2.73 MIL/uL — ABNORMAL LOW (ref 3.87–5.11)
RDW: 12.3 % (ref 11.5–15.5)
WBC: 19.8 K/uL — ABNORMAL HIGH (ref 4.0–10.5)
nRBC: 0 % (ref 0.0–0.2)

## 2024-05-26 LAB — C-REACTIVE PROTEIN: CRP: 15 mg/dL — ABNORMAL HIGH (ref <1.0)

## 2024-05-26 MED ORDER — OXYCODONE HCL 5 MG PO TABS
5.0000 mg | ORAL_TABLET | ORAL | Status: DC | PRN
  Administered 2024-05-27 – 2024-05-30 (×5): 10 mg via ORAL
  Administered 2024-05-30: 02:00:00 5 mg via ORAL
  Administered 2024-05-30 – 2024-06-01 (×4): 10 mg via ORAL
  Administered 2024-06-02: 5 mg via ORAL
  Filled 2024-05-26 (×7): qty 2
  Filled 2024-05-26: qty 1
  Filled 2024-05-26: qty 2
  Filled 2024-05-26: qty 1
  Filled 2024-05-26: qty 2

## 2024-05-26 MED ORDER — SENNA 8.6 MG PO TABS
1.0000 | ORAL_TABLET | Freq: Every evening | ORAL | Status: DC | PRN
  Administered 2024-05-28: 8.6 mg via ORAL
  Filled 2024-05-26: qty 1

## 2024-05-26 MED ORDER — SMOG ENEMA
960.0000 mL | Freq: Once | RECTAL | Status: AC
  Administered 2024-05-26: 960 mL via RECTAL
  Filled 2024-05-26: qty 960

## 2024-05-26 MED ORDER — MIDAZOLAM HCL (PF) 2 MG/2ML IJ SOLN
INTRAMUSCULAR | Status: AC | PRN
  Administered 2024-05-26 (×3): 1 mg via INTRAVENOUS

## 2024-05-26 MED ORDER — FENTANYL CITRATE (PF) 100 MCG/2ML IJ SOLN
INTRAMUSCULAR | Status: AC | PRN
  Administered 2024-05-26 (×2): 50 ug via INTRAVENOUS

## 2024-05-26 MED ORDER — SODIUM CHLORIDE 0.9% FLUSH
5.0000 mL | Freq: Three times a day (TID) | INTRAVENOUS | Status: DC
  Administered 2024-05-26 – 2024-06-03 (×17): 5 mL

## 2024-05-26 MED ORDER — FENTANYL CITRATE (PF) 100 MCG/2ML IJ SOLN
INTRAMUSCULAR | Status: AC
  Filled 2024-05-26: qty 4

## 2024-05-26 MED ORDER — FERROUS SULFATE 325 (65 FE) MG PO TABS
325.0000 mg | ORAL_TABLET | ORAL | Status: DC
  Administered 2024-05-27 – 2024-06-02 (×4): 325 mg via ORAL
  Filled 2024-05-26 (×4): qty 1

## 2024-05-26 MED ORDER — MIDAZOLAM HCL 2 MG/2ML IJ SOLN
INTRAMUSCULAR | Status: AC
  Filled 2024-05-26: qty 4

## 2024-05-26 MED ORDER — GABAPENTIN 100 MG PO CAPS
100.0000 mg | ORAL_CAPSULE | Freq: Two times a day (BID) | ORAL | Status: DC
  Administered 2024-05-26 – 2024-06-02 (×17): 100 mg via ORAL
  Filled 2024-05-26 (×17): qty 1

## 2024-05-26 NOTE — Progress Notes (Signed)
 Notified by RN: pt complains of constipation. Would like something to help with BM.  Took multiple things including fleets enema, ginger tea, and dulcolax suppository with no results prior to admission.   Has Miralax  ordered. Will add Senna.   Since no evidence of bowel injury, can also do SMOG enema. Orders placed.   Vina Solian, MD Attending Obstetrician & Gynecologist, [facility] for [facility], [facility]

## 2024-05-26 NOTE — Procedures (Signed)
 Interventional Radiology Procedure Note  Procedure: CT guided placement of a RIGHT TRANSGLUTEAL 42F drain with evacuation of 10 mL bloody fluid.  Sent for culture  Complications: None  Estimated Blood Loss: None  Recommendations: - Probable post-op hematoma rather than abscess - Drain to JP, flush TID   Signed,  Wilkie LOIS Lent, MD

## 2024-05-26 NOTE — Plan of Care (Signed)

## 2024-05-26 NOTE — Consult Note (Signed)
 Reason for Consult:pelvic abscess Referring Physician: Dr. SHAUNNA Solian  [name] is an 38 y.o. female.  HPI: This is a 38 year old female who is postop day 11 status post transvaginal hysterectomy and right salpingectomy.  She was admitted last evening with increasing vaginal pelvic pressure and pain.  She was found to have an elevated white blood count which today is 19.8.  A CT scan of the abdomen and pelvis showed a 6.4 cm abscess in the pelvis.  There was a question of a foreign body in this collection.  Patient reports she has had only 1 small bowel meant postoperatively which was nonbloody.  She has been passing flatus.  Past Medical History:  Diagnosis Date   Abnormal Pap smear    Colpo>normal   Anxiety    Cholestasis during pregnancy in third trimester 07/12/2020   07/11/20 Bile acids 13.8     Fibroid    GERD (gastroesophageal reflux disease)    Gonorrhea    Headache    last migraine 01/2017   History of IBS    watch diet, no meds   Infection    UTI   Multiple sclerosis    Neuromuscular disorder (HCC)    diagnosed with MS this visit   Obesity    Ovarian cyst    Pernicious anemia 05/02/2013   Pregnancy induced hypertension    Preterm labor    all 3 deliveries   SVD (spontaneous vaginal delivery)    x 3   Urinary tract infection    Vaginal Pap smear, abnormal     Past Surgical History:  Procedure Laterality Date   CHOLECYSTECTOMY N/A 11/25/2017   Procedure: LAPAROSCOPIC CHOLECYSTECTOMY WITH INTRAOPERATIVE CHOLANGIOGRAM;  Surgeon: Eletha Boas, MD;  Location: [facility];  Service: General;  Laterality: N/A;   COLPOSCOPY     HYSTERECTOMY, VAGINAL, WITH SALPINGECTOMY  05/15/2024   Procedure: HYSTERECTOMY, VAGINAL, WITH RIGHT SALPINGECTOMY;  Surgeon: [name], MD;  Location: [facility] OR;  Service: Gynecology;;  [facility]   MYOMECTOMY N/A 04/01/2017   Procedure: Vaginal Myomectomy;  Surgeon: Herchel Gloris LABOR, MD;  Location: [facility];  Service: Gynecology;  Laterality: N/A;    TUBAL LIGATION     2012/ reversal June 2020   WISDOM TOOTH EXTRACTION      Family History  Problem Relation Age of Onset   Diabetes Mother    Hypertension Mother    Diabetes Father    Hypertension Father    Kidney disease Father    Stroke Father    Learning disabilities Brother    Anesthesia problems Neg Hx    Other Neg Hx     Social History:  reports that she has never smoked. She has never used smokeless tobacco. She reports that she does not drink alcohol and does not use drugs.  Allergies: Allergies[1]  Medications: I have reviewed the patient's current medications.  Results for orders placed or performed during the hospital encounter of 05/25/24 (from the past 48 hours)  Urinalysis, Routine w reflex microscopic -Urine, Clean Catch     Status: Abnormal   Collection Time: 05/25/24  2:21 PM  Result Value Ref Range   Color, Urine YELLOW YELLOW   APPearance CLEAR CLEAR   Specific Gravity, Urine 1.023 1.005 - 1.030   pH 5.0 5.0 - 8.0   Glucose, UA NEGATIVE NEGATIVE mg/dL   Hgb urine dipstick SMALL (A) NEGATIVE   Bilirubin Urine NEGATIVE NEGATIVE   Ketones, ur NEGATIVE NEGATIVE mg/dL   Protein, ur 30 (A) NEGATIVE  mg/dL   Nitrite NEGATIVE NEGATIVE   Leukocytes,Ua NEGATIVE NEGATIVE   RBC / HPF 21-50 0 - 5 RBC/hpf   WBC, UA 0-5 0 - 5 WBC/hpf   Bacteria, UA NONE SEEN NONE SEEN   Squamous Epithelial / HPF 0-5 0 - 5 /HPF   Mucus PRESENT     Comment: Performed at [facility], [address], [place], [place] [address]  Lipase, blood     Status: None   Collection Time: 05/25/24  2:25 PM  Result Value Ref Range   Lipase 32 11 - 51 U/L    Comment: Performed at [facility], [address] [place], [place] [address]  Comprehensive metabolic panel     Status: Abnormal   Collection Time: 05/25/24  2:25 PM  Result Value Ref Range   Sodium 135 135 - 145 mmol/L   Potassium 3.9 3.5 - 5.1 mmol/L   Chloride 101 98 - 111 mmol/L   CO2 23 22 -  32 mmol/L   Glucose, Bld 106 (H) 70 - 99 mg/dL    Comment: Glucose reference range applies only to samples taken after fasting for at least 8 hours.   BUN 9 6 - 20 mg/dL   Creatinine, Ser 9.11 0.44 - 1.00 mg/dL   Calcium  9.3 8.9 - 10.3 mg/dL   Total Protein 7.2 6.5 - 8.1 g/dL   Albumin  4.3 3.5 - 5.0 g/dL   AST 811 (H) 15 - 41 U/L   ALT 153 (H) 0 - 44 U/L   Alkaline Phosphatase 86 38 - 126 U/L   Total Bilirubin 0.7 0.0 - 1.2 mg/dL   GFR, Estimated >39 >39 mL/min    Comment: (NOTE) Calculated using the CKD-EPI Creatinine Equation (2021)    Anion gap 11 5 - 15    Comment: Performed at [facility], [address], [place], [place] [address]  CBC     Status: Abnormal   Collection Time: 05/25/24  2:25 PM  Result Value Ref Range   WBC 11.9 (H) 4.0 - 10.5 K/uL   RBC 3.13 (L) 3.87 - 5.11 MIL/uL   Hemoglobin 9.3 (L) 12.0 - 15.0 g/dL   HCT 70.5 (L) 63.9 - 53.9 %   MCV 93.9 80.0 - 100.0 fL   MCH 29.7 26.0 - 34.0 pg   MCHC 31.6 30.0 - 36.0 g/dL   RDW 87.6 88.4 - 84.4 %   Platelets 641 (H) 150 - 400 K/uL   nRBC 0.0 0.0 - 0.2 %    Comment: Performed at [facility], [address], [place], [place] [address]  hCG, serum, qualitative     Status: None   Collection Time: 05/25/24  2:25 PM  Result Value Ref Range   Preg, Serum NEGATIVE NEGATIVE    Comment:        THE SENSITIVITY OF THIS METHODOLOGY IS >10 mIU/mL. Performed at [facility], [address] [place], [place] [address]   Blood culture (routine x 2)     Status: None (Preliminary result)   Collection Time: 05/25/24  8:00 PM   Specimen: BLOOD  Result Value Ref Range   Specimen Description      BLOOD BLOOD LEFT ARM Performed at [facility], [address], [place], [place] [address]    Special Requests      BOTTLES DRAWN AEROBIC AND ANAEROBIC Blood Culture adequate volume Performed at [facility], [address]  [place], [place]  [address]    Culture      NO GROWTH < 12 HOURS Performed at [facility], [address] [place], [place] [address]    Report Status PENDING   I-Stat CG4 Lactic Acid     Status: None   Collection Time: 05/25/24  8:12 PM  Result Value Ref Range   Lactic Acid, Venous 0.8 0.5 - 1.9 mmol/L  Blood culture (routine x 2)     Status: None (Preliminary result)   Collection Time: 05/25/24  8:26 PM   Specimen: BLOOD  Result Value Ref Range   Specimen Description      BLOOD RIGHT ANTECUBITAL Performed at [facility], [address] [place], [place] [address]    Special Requests      BOTTLES DRAWN AEROBIC AND ANAEROBIC Blood Culture results may not be optimal due to an inadequate volume of blood received in culture bottles Performed at [facility], [address], [place], [place] [address]    Culture      NO GROWTH < 12 HOURS Performed at [facility], [address] [place], [place] [address]    Report Status PENDING   C-reactive protein     Status: Abnormal   Collection Time: 05/26/24  4:39 AM  Result Value Ref Range   CRP 15.0 (H) <1.0 mg/dL    Comment: Performed at [facility], [address], [place], [place] [address]  CBC with Differential/Platelet     Status: Abnormal   Collection Time: 05/26/24  4:39 AM  Result Value Ref Range   WBC 19.8 (H) 4.0 - 10.5 K/uL   RBC 2.73 (L) 3.87 - 5.11 MIL/uL   Hemoglobin 8.2 (L) 12.0 - 15.0 g/dL   HCT 74.4 (L) 63.9 - 53.9 %   MCV 93.4 80.0 - 100.0 fL   MCH 30.0 26.0 - 34.0 pg   MCHC 32.2 30.0 - 36.0 g/dL   RDW 87.6 88.4 - 84.4 %   Platelets 535 (H) 150 - 400 K/uL   nRBC 0.0 0.0 - 0.2 %   Neutrophils Relative % 86 %   Neutro Abs 17.0 (H) 1.7 - 7.7 K/uL   Lymphocytes Relative 6 %   Lymphs Abs 1.2 0.7 - 4.0 K/uL   Monocytes Relative 7 %   Monocytes Absolute 1.4 (H) 0.1 - 1.0 K/uL   Eosinophils Relative 0 %   Eosinophils Absolute 0.0 0.0 - 0.5 K/uL   Basophils  Relative 0 %   Basophils Absolute 0.1 0.0 - 0.1 K/uL   Immature Granulocytes 1 %   Abs Immature Granulocytes 0.11 (H) 0.00 - 0.07 K/uL    Comment: Performed at [facility], [address] [place], [place] [address]  Comprehensive metabolic panel     Status: Abnormal   Collection Time: 05/26/24  4:39 AM  Result Value Ref Range   Sodium 133 (L) 135 - 145 mmol/L   Potassium 3.9 3.5 - 5.1 mmol/L   Chloride 102 98 - 111 mmol/L   CO2 23 22 - 32 mmol/L   Glucose, Bld 109 (H) 70 - 99 mg/dL    Comment: Glucose reference range applies only to samples taken after fasting for at least 8 hours.   BUN 8 6 - 20 mg/dL   Creatinine, Ser 8.90 (H) 0.44 - 1.00 mg/dL   Calcium  8.2 (L) 8.9 - 10.3 mg/dL   Total Protein 5.6 (L) 6.5 - 8.1 g/dL   Albumin  2.8 (L) 3.5 -  5.0 g/dL   AST 857 (H) 15 - 41 U/L   ALT 159 (H) 0 - 44 U/L   Alkaline Phosphatase 73 38 - 126 U/L   Total Bilirubin 1.4 (H) 0.0 - 1.2 mg/dL   GFR, Estimated >39 >39 mL/min    Comment: (NOTE) Calculated using the CKD-EPI Creatinine Equation (2021)    Anion gap 8 5 - 15    Comment: Performed at [facility], [address] [place], [place] [address]    DG Chest 1 View Result Date: 05/26/2024 CLINICAL DATA:  Hypoxia. EXAM: CHEST  1 VIEW COMPARISON:  06/04/2023 FINDINGS: Low lung volumes. Low volume film accentuates cardiopericardial silhouette. No edema or focal consolidation. No substantial pleural effusion. No acute bony abnormality. Gaseous distention of colon noted in the visualized upper abdomen. IMPRESSION: Low volume film without acute cardiopulmonary findings. Electronically Signed   By: Camellia Candle M.D.   On: 05/26/2024 07:53   CT ABDOMEN PELVIS W CONTRAST Result Date: 05/25/2024 EXAM: CT ABDOMEN AND PELVIS WITH CONTRAST 05/25/2024 07:14:00 PM TECHNIQUE: CT of the abdomen and pelvis was performed with the administration of 100 mL of iohexol  (OMNIPAQUE ) 300 MG/ML solution. Multiplanar reformatted images are provided  for review. Automated exposure control, iterative reconstruction, and/or weight-based adjustment of the mA/kV was utilized to reduce the radiation dose to as low as reasonably achievable. COMPARISON: 03/09/2024 CLINICAL HISTORY: Abdominal pain, post-op; Bowel obstruction suspected. FINDINGS: LOWER CHEST: No acute abnormality. LIVER: The liver is unremarkable. GALLBLADDER AND BILE DUCTS: Post cholecystectomy. No biliary ductal dilatation. SPLEEN: No acute abnormality. PANCREAS: No acute abnormality. ADRENAL GLANDS: No acute abnormality. KIDNEYS, URETERS AND BLADDER: No stones in the kidneys or ureters. No hydronephrosis. No perinephric or periureteral stranding. Urinary bladder is unremarkable. GI AND BOWEL: Stomach demonstrates no acute abnormality. Gaseous distension of colon, likely reflecting adynamic ileus in this clinical setting. There is no bowel obstruction. PERITONEUM AND RETROPERITONEUM: 6.4 x 4.0 cm bilobed pelvic fluid collection, with surrounding pelvic inflammatory changes, raising concern for abscess. Secondary involvement of the rectosigmoid colon. High density within the collection (image 78) at least raises the possibility of foreign body. No free air. VASCULATURE: Aorta is normal in caliber. LYMPH NODES: No lymphadenopathy. REPRODUCTIVE ORGANS: No acute abnormality. BONES AND SOFT TISSUES: No acute osseous abnormality. Right abdominal wall lipoma. IMPRESSION: 1. Status post hysterectomy. 2. 6.4 cm pelvic fluid collection, concerning for abscess. 3. Although equivocal, high density within the collection raises the possibility of a foreign body. 4. Secondary involvement of the rectosigmoid colon with suspected adynamic colonic ileus. 5. These results were called by telephone at the time of interpretation on 05/25/2024 at 1930 hrs to provider Dr Bari. Electronically signed by: Pinkie Pebbles MD 05/25/2024 07:35 PM EST RP Workstation: [id number]    Review of Systems  All other systems reviewed  and are negative.  Blood pressure (!) 109/52, pulse 81, temperature 98.4 F (36.9 C), temperature source Oral, resp. rate 18, weight 113.4 kg, last menstrual period 04/23/2024, SpO2 97%. Physical Exam Constitutional:      Appearance: She is obese. She is not toxic-appearing.  HENT:     Right Ear: External ear normal.     Left Ear: External ear normal.     Nose: Nose normal.  Cardiovascular:     Rate and Rhythm: Normal rate and regular rhythm.  Pulmonary:     Effort: Pulmonary effort is normal. No respiratory distress.  Abdominal:     Comments: Abdomen is soft and obese.  There is some  mild suprapubic tenderness with guarding but no frank peritonitis  Skin:    General: Skin is warm and dry.  Neurological:     General: No focal deficit present.     Mental Status: She is alert.  Psychiatric:        Behavior: Behavior normal.        Thought Content: Thought content normal.     Assessment/Plan: POD 11 status post total vaginal hysterectomy and right salpingectomy now with pelvic abscess  I have reviewed the CT scan and evaluated the patient.  I suspect this is a postoperative hematoma that has become secondarily infected.  I doubt there is an extraperitoneal rectal injury.  I feel is less likely there is a foreign body in this collection.  IR has been asked to evaluate the patient.  Hopefully they will be able to place a drain.  If not, I would recommend the primary service perform a diagnostic laparoscopy and washout the collection in the pelvis.  Again, I do not suspect a rectal injury.  There is nothing further to offer from a general surgical standpoint.  We will see as needed.  Moderate medical decision making  Vicenta Poli 05/26/2024, 11:47 AM         [1] No Known Allergies

## 2024-05-26 NOTE — Progress Notes (Signed)
 Gynecology Progress Note  Admission Date: 05/25/2024 Current Date: 05/26/2024 12:12 PM  [name] is a 38 y.o. [id number] HD#1 admitted for pelvic abscess s/p TVH on 05/15/24 by Dr. Eveline.    History complicated by: Patient Active Problem List   Diagnosis Date Noted   Adynamic ileus (HCC) 05/26/2024   Abnormal LFTs 05/26/2024   Pelvic abscess in female 05/25/2024   Menorrhagia with regular cycle 05/15/2024   DUB (dysfunctional uterine bleeding) 02/16/2024   Hyperglycemia 07/22/2020   Maternal morbid obesity, antepartum (HCC) 07/11/2020   BMI 40.0-44.9, adult (HCC) 07/11/2020   Hx of preeclampsia, prior pregnancy, currently pregnant 05/23/2020   History of reversal of tubal ligation 12/20/2019   Adenomyosis 05/09/2019   Anxiety 11/28/2018   Endometriosis 05/23/2018   Fibroid of cervix    Cervical mass 12/23/2016   Child behavior problem 03/27/2016   Malabsorption 08/29/2013   Acute relapsing multiple sclerosis (HCC) 07/22/2013   B12 deficiency 06/27/2013   Insomnia 06/27/2013   Adjustment disorder with mixed anxiety and depressed mood 03/13/2013    ROS and patient/family/surgical history, located on admission H&P note dated 05/25/2024, have been reviewed, and there are no changes except as noted below Yesterday/Overnight Events:  She was started on Zosyn (switched from Cefepime, Flagyl  and Vancomycin).   Subjective:  Patient reports pain that is in her lower pelvis that is stable. She has some appetite, but has not yet eaten anything.  Objective:    Current Vital Signs 24h Vital Sign Ranges  T 98.4 F (36.9 C) Temp  Avg: 99.1 F (37.3 C)  Min: 98.3 F (36.8 C)  Max: 100.1 F (37.8 C)  BP (!) 109/52 BP  Min: 104/68  Max: 137/68  HR 81 Pulse  Avg: 86.2  Min: 81  Max: 94  RR 18 Resp  Avg: 17.2  Min: 16  Max: 18  SaO2 97 % Nasal Cannula SpO2  Avg: 95.8 %  Min: 89 %  Max: 100 %       24 Hour I/O Current Shift I/O  Time Ins Outs 12/11 0701 - 12/12 0700 In: 1375.3  [P.O.:120; I.V.:36.2] Out: 300 [Urine:300] No intake/output data recorded.   Patient Vitals for the past 24 hrs:  BP Temp Temp src Pulse Resp SpO2 Weight  05/26/24 1120 -- -- -- -- -- 97 % --  05/26/24 1015 -- -- -- -- -- 96 % --  05/26/24 0749 (!) 109/52 98.4 F (36.9 C) Oral 81 18 98 % --  05/26/24 0518 -- -- -- -- -- -- 113.4 kg  05/26/24 0321 (!) 108/53 98.9 F (37.2 C) Oral 83 18 -- --  05/26/24 0128 -- 99.2 F (37.3 C) Oral -- -- 98 % --  05/26/24 0057 -- -- -- -- -- (!) 89 % --  05/25/24 2345 (!) 111/57 100.1 F (37.8 C) Oral 92 18 92 % --  05/25/24 2315 -- -- -- -- -- 92 % --  05/25/24 2220 -- -- -- -- -- 97 % --  05/25/24 2215 122/64 99.6 F (37.6 C) Oral 94 -- 96 % --  05/25/24 1856 137/68 99 F (37.2 C) Oral 82 16 100 % --  05/25/24 1705 -- -- -- -- -- 97 % --  05/25/24 1407 104/68 98.3 F (36.8 C) Oral 85 16 97 % --    Physical exam: General appearance: alert, cooperative, and appears stated age Abdomen: Diffusely tender on exam GU: No gross VB Lungs: Normal effort Heart: Normal rate Skin: Intact Psych: appropriate Neurologic:  Alert and oriented X 3, normal strength and tone. Normal symmetric reflexes. Normal coordination and gait  Medications Current Facility-Administered Medications  Medication Dose Route Frequency Provider Last Rate Last Admin   alum & mag hydroxide-simeth (MAALOX/MYLANTA) 200-200-20 MG/5ML suspension 30 mL  30 mL Oral Q4H PRN Fredirick Glenys RAMAN, MD       cyclobenzaprine  (FLEXERIL ) tablet 5-10 mg  5-10 mg Oral TID PRN Fredirick Glenys RAMAN, MD       gabapentin  (NEURONTIN ) capsule 100 mg  100 mg Oral BID Fredirick Glenys RAMAN, MD   100 mg at 05/26/24 9090   guaiFENesin  (ROBITUSSIN) 100 MG/5ML liquid 15 mL  15 mL Oral Q4H PRN Fredirick Glenys RAMAN, MD       HYDROmorphone  (DILAUDID ) injection 1 mg  1 mg Intravenous Q2H PRN Pratt, Tanya S, MD   1 mg at 05/26/24 0945   ketorolac  (TORADOL ) 30 MG/ML injection 30 mg  30 mg Intravenous Q6H Pratt, Tanya S, MD   30 mg at  05/26/24 9092   Or   ketorolac  (TORADOL ) 30 MG/ML injection 30 mg  30 mg Intramuscular Q6H Fredirick Glenys RAMAN, MD       lactated ringers  infusion   Intravenous Continuous Fredirick Glenys RAMAN, MD 100 mL/hr at 05/25/24 2252 New Bag at 05/25/24 2252   menthol  (CEPACOL) lozenge 3 mg  1 lozenge Oral Q2H PRN Fredirick Glenys RAMAN, MD       ondansetron  (ZOFRAN ) tablet 4 mg  4 mg Oral Q6H PRN Fredirick Glenys RAMAN, MD       Or   ondansetron  (ZOFRAN ) injection 4 mg  4 mg Intravenous Q6H PRN Fredirick Glenys RAMAN, MD   4 mg at 05/25/24 2234   oxyCODONE -acetaminophen  (PERCOCET/ROXICET) 5-325 MG per tablet 1-2 tablet  1-2 tablet Oral Q3H PRN Fredirick Glenys RAMAN, MD   1 tablet at 05/25/24 2231   pantoprazole  (PROTONIX ) injection 40 mg  40 mg Intravenous QHS Pratt, Tanya S, MD   40 mg at 05/26/24 0112   piperacillin-tazobactam (ZOSYN) IVPB 3.375 g  3.375 g Intravenous Q8H Fredirick Glenys RAMAN, MD 12.5 mL/hr at 05/26/24 0911 3.375 g at 05/26/24 0911   polyethylene glycol (MIRALAX  / GLYCOLAX ) packet 17 g  17 g Oral Daily PRN Fredirick Glenys RAMAN, MD        Labs  Recent Labs  Lab 05/25/24 1425 05/26/24 0439  WBC 11.9* 19.8*  HGB 9.3* 8.2*  HCT 29.4* 25.5*  PLT 641* 535*    Recent Labs  Lab 05/25/24 1425 05/26/24 0439  NA 135 133*  K 3.9 3.9  CL 101 102  CO2 23 23  BUN 9 8  CREATININE 0.88 1.09*  CALCIUM  9.3 8.2*  PROT 7.2 5.6*  BILITOT 0.7 1.4*  ALKPHOS 86 73  ALT 153* 159*  AST 188* 142*  GLUCOSE 106* 109*   Assessment & Plan:  Pelvic Abscess  - Consulted IR and plan is to try a drain. Discussed with MD, not a foreign body but residual free body from an epicloacal appendigitis. She is NPO. Reviewed expectations for drain and the presence of it typically for a couple weeks.  - GS consulted to ensure no bowel injury - no concern. Appreciate their input.  - Reviewed with patient likely related  to slow blood loss after surgery.   Mildly elevated creatinine - Consistent with prior checks for her. Will continue IVF hydration. May  also improve with some PO hydration once no longer NPO.   Elevated LFTs - Will check again tomorrow  along with hepatitis panel. Could be related to infection as well.   Anemia - Will start Iron  tomorrow. Likely due to blood loss related to surgery and now why there is the infection.   Pain - Will switch to primarily oral medicines once done in IR  FEN/GI - Will allow diet once back from IR - Will do IVF overnight and then consider heplock if Cr improves.   Ppx - SCDs  Dispo - She is hopeful for discharge on Sunday as son's 16th birthday dinner. Reviewed will depend on how she feels clinically, WBC and fever. Goal is 48hr ABX (tomorrow evening) and then switch to PO regimen to ensure she tolerates it).    Code Status: Full Code  Total time taking care of the patient was 30 minutes, with greater than 50% of the time spent in face to face interaction with the patient.  Vina Solian, MD Attending Center for [facility])

## 2024-05-26 NOTE — Consult Note (Signed)
 Chief Complaint: Pelvic abscess. Request is for drain placement.  Referring Physician(s): Dr. Vina Solian  Supervising Physician: Karalee Beat  Patient Status: [facility] - In-pt  History of Present Illness: [name] is a 38 y.o. female  inpatient. History of GERD, MS.  Menorrhea s/p hysterectomy with right salpingectomy on 12.1.25. Presented to the ED at [facility] on 12.11.25 with abdominal pain. Found to have a pelvic abscess. CT abdomen pelvis from 12.11.25 reads 6.4 cm pelvic fluid collection, concerning for abscess. Team is requesting an abscess drain placement.  Currently without any significant complaints. Endorses lower abdominal pain that she states is improving. Patient alert and laying in bed,calm. Denies any fevers, headache, chest pain, SOB, cough, abdominal pain, nausea, vomiting or bleeding.   WBC 19.8, Albumin  2.8, AST 142, ALT 159. NKDA. All other labs and medications are within acceptable parameters. Patient has been NPO since midnight.   Return precautions and treatment recommendations and follow-up discussed with the patient  who is agreeable with the plan.    Past Medical History:  Diagnosis Date   Abnormal Pap smear    Colpo>normal   Anxiety    Cholestasis during pregnancy in third trimester 07/12/2020   07/11/20 Bile acids 13.8     Fibroid    GERD (gastroesophageal reflux disease)    Gonorrhea    Headache    last migraine 01/2017   History of IBS    watch diet, no meds   Infection    UTI   Multiple sclerosis    Neuromuscular disorder (HCC)    diagnosed with MS this visit   Obesity    Ovarian cyst    Pernicious anemia 05/02/2013   Pregnancy induced hypertension    Preterm labor    all 3 deliveries   SVD (spontaneous vaginal delivery)    x 3   Urinary tract infection    Vaginal Pap smear, abnormal     Past Surgical History:  Procedure Laterality Date   CHOLECYSTECTOMY N/A 11/25/2017   Procedure: LAPAROSCOPIC CHOLECYSTECTOMY WITH  INTRAOPERATIVE CHOLANGIOGRAM;  Surgeon: Eletha Boas, MD;  Location: [facility] ORS;  Service: General;  Laterality: N/A;   COLPOSCOPY     HYSTERECTOMY, VAGINAL, WITH SALPINGECTOMY  05/15/2024   Procedure: HYSTERECTOMY, VAGINAL, WITH RIGHT SALPINGECTOMY;  Surgeon: [name], MD;  Location: [facility] OR;  Service: Gynecology;;  [facility] BS   MYOMECTOMY N/A 04/01/2017   Procedure: Vaginal Myomectomy;  Surgeon: Herchel Gloris LABOR, MD;  Location: [facility] ORS;  Service: Gynecology;  Laterality: N/A;   TUBAL LIGATION     2012/ reversal June 2020   WISDOM TOOTH EXTRACTION      Allergies: Patient has no known allergies.  Medications: Prior to Admission medications  Medication Sig Start Date End Date Taking? Authorizing Provider  cyanocobalamin  (VITAMIN B12) 1000 MCG/ML injection Inject 1,000 mcg into the skin every 30 (thirty) days.   Yes [provider]  cyclobenzaprine  (FLEXERIL ) 10 MG tablet Take 5-10 mg by mouth 3 (three) times daily as needed for muscle spasms.   Yes [provider]  famotidine  (PEPCID ) 20 MG tablet Take 1 tablet (20 mg total) by mouth 2 (two) times daily. Patient taking differently: Take 20 mg by mouth daily as needed for heartburn or indigestion. 01/12/24  Yes Geiple, Joshua, PA-C  ocrelizumab (OCREVUS) 300 MG/10ML injection Inject 300 mg into the vein every 6 (six) months.   Yes [provider]  ondansetron  (ZOFRAN -ODT) 4 MG disintegrating tablet Take 1 tablet (4 mg total) by mouth every  6 (six) hours as needed for nausea. 05/16/24  Yes Constant, Peggy, MD  oxyCODONE  (OXY IR/ROXICODONE ) 5 MG immediate release tablet Take 1-2 tablets (5-10 mg total) by mouth every 4 (four) hours as needed for moderate pain (pain score 4-6). 05/16/24  Yes [name], MD  pantoprazole  (PROTONIX ) 40 MG tablet Take 40 mg by mouth daily as needed (acid reflux).   Yes [provider]  Vitamin D , Ergocalciferol , (DRISDOL ) 1.25 MG (50000 UNIT) CAPS capsule Take 50,000 Units by mouth  once a week.   Yes [provider]     Family History  Problem Relation Age of Onset   Diabetes Mother    Hypertension Mother    Diabetes Father    Hypertension Father    Kidney disease Father    Stroke Father    Learning disabilities Brother    Anesthesia problems Neg Hx    Other Neg Hx     Social History   Socioeconomic History   Marital status: Single    Spouse name: Not on file   Number of children: 4   Years of education: 13   Highest education level: Not on file  Occupational History   Occupation: UNEMPLOYED-DISABLE    Comment: [employer]  Tobacco Use   Smoking status: Never   Smokeless tobacco: Never  Vaping Use   Vaping status: Never Used  Substance and Sexual Activity   Alcohol use: No    Alcohol/week: 0.0 standard drinks of alcohol    Comment:     Drug use: No    Comment: denies use, states was given CBD gummies for nausea   Sexual activity: Yes    Partners: Male    Birth control/protection: Injection  Other Topics Concern   Not on file  Social History Narrative   Lives with 4 children. Good support group in her community including mother who lives close.    Social Drivers of Health   Tobacco Use: Low Risk (05/25/2024)   Patient History    Smoking Tobacco Use: Never    Smokeless Tobacco Use: Never    Passive Exposure: Not on file  Financial Resource Strain: Not on file  Food Insecurity: No Food Insecurity (05/16/2024)   Epic    Worried About Programme Researcher, Broadcasting/film/video in the Last Year: Never true    Ran Out of Food in the Last Year: Never true  Transportation Needs: No Transportation Needs (05/16/2024)   Epic    Lack of Transportation (Medical): No    Lack of Transportation (Non-Medical): No  Physical Activity: Not on file  Stress: Not on file  Social Connections: Not on file  Depression (PHQ2-9): Low Risk (12/15/2023)   Depression (PHQ2-9)    PHQ-2 Score: 3  Alcohol Screen: Not on file  Housing: Low Risk (05/16/2024)   Epic     Unable to Pay for Housing in the Last Year: No    Number of Times Moved in the Last Year: 0    Homeless in the Last Year: No  Utilities: Not At Risk (05/16/2024)   Epic    Threatened with loss of utilities: No  Health Literacy: Not on file    Review of Systems: A 12 point ROS discussed and pertinent positives are indicated in the HPI above.  All other systems are negative.  Review of Systems  Constitutional:  Negative for fatigue and fever.  HENT:  Negative for congestion.   Respiratory:  Negative for cough and shortness of breath.  Gastrointestinal:  Positive for abdominal pain (lower abdominal improving). Negative for diarrhea, nausea and vomiting.    Vital Signs: BP (!) 109/52 (BP Location: Right Arm)   Pulse 81   Temp 98.4 F (36.9 C) (Oral)   Resp 18   Wt 250 lb (113.4 kg)   LMP 04/23/2024 (Approximate)   SpO2 98%   BMI 42.91 kg/m     Physical Exam Vitals and nursing note reviewed.  Constitutional:      Appearance: She is well-developed. She is obese.  HENT:     Head: Normocephalic and atraumatic.     Mouth/Throat:     Mouth: Mucous membranes are dry.  Eyes:     Conjunctiva/sclera: Conjunctivae normal.  Pulmonary:     Effort: Pulmonary effort is normal.  Musculoskeletal:        General: Normal range of motion.     Cervical back: Normal range of motion.  Skin:    General: Skin is warm.  Neurological:     General: No focal deficit present.     Mental Status: She is alert and oriented to person, place, and time. Mental status is at baseline.  Psychiatric:        Mood and Affect: Mood normal.        Behavior: Behavior normal.        Thought Content: Thought content normal.        Judgment: Judgment normal.     Imaging: DG Chest 1 View Result Date: 05/26/2024 CLINICAL DATA:  Hypoxia. EXAM: CHEST  1 VIEW COMPARISON:  06/04/2023 FINDINGS: Low lung volumes. Low volume film accentuates cardiopericardial silhouette. No edema or focal consolidation. No  substantial pleural effusion. No acute bony abnormality. Gaseous distention of colon noted in the visualized upper abdomen. IMPRESSION: Low volume film without acute cardiopulmonary findings. Electronically Signed   By: Camellia Candle M.D.   On: 05/26/2024 07:53   CT ABDOMEN PELVIS W CONTRAST Result Date: 05/25/2024 EXAM: CT ABDOMEN AND PELVIS WITH CONTRAST 05/25/2024 07:14:00 PM TECHNIQUE: CT of the abdomen and pelvis was performed with the administration of 100 mL of iohexol  (OMNIPAQUE ) 300 MG/ML solution. Multiplanar reformatted images are provided for review. Automated exposure control, iterative reconstruction, and/or weight-based adjustment of the mA/kV was utilized to reduce the radiation dose to as low as reasonably achievable. COMPARISON: 03/09/2024 CLINICAL HISTORY: Abdominal pain, post-op; Bowel obstruction suspected. FINDINGS: LOWER CHEST: No acute abnormality. LIVER: The liver is unremarkable. GALLBLADDER AND BILE DUCTS: Post cholecystectomy. No biliary ductal dilatation. SPLEEN: No acute abnormality. PANCREAS: No acute abnormality. ADRENAL GLANDS: No acute abnormality. KIDNEYS, URETERS AND BLADDER: No stones in the kidneys or ureters. No hydronephrosis. No perinephric or periureteral stranding. Urinary bladder is unremarkable. GI AND BOWEL: Stomach demonstrates no acute abnormality. Gaseous distension of colon, likely reflecting adynamic ileus in this clinical setting. There is no bowel obstruction. PERITONEUM AND RETROPERITONEUM: 6.4 x 4.0 cm bilobed pelvic fluid collection, with surrounding pelvic inflammatory changes, raising concern for abscess. Secondary involvement of the rectosigmoid colon. High density within the collection (image 78) at least raises the possibility of foreign body. No free air. VASCULATURE: Aorta is normal in caliber. LYMPH NODES: No lymphadenopathy. REPRODUCTIVE ORGANS: No acute abnormality. BONES AND SOFT TISSUES: No acute osseous abnormality. Right abdominal wall lipoma.  IMPRESSION: 1. Status post hysterectomy. 2. 6.4 cm pelvic fluid collection, concerning for abscess. 3. Although equivocal, high density within the collection raises the possibility of a foreign body. 4. Secondary involvement of the rectosigmoid colon with suspected adynamic colonic ileus.  5. These results were called by telephone at the time of interpretation on 05/25/2024 at 1930 hrs to provider Dr Bari. Electronically signed by: Pinkie Pebbles MD 05/25/2024 07:35 PM EST RP Workstation: [id number]    Labs:  CBC: Recent Labs    05/16/24 0458 05/16/24 1000 05/25/24 1425 05/26/24 0439  WBC 7.9 7.0 11.9* 19.8*  HGB 8.2* 9.2* 9.3* 8.2*  HCT 25.5* 29.0* 29.4* 25.5*  PLT 363 439* 641* 535*    COAGS: No results for input(s): INR, APTT in the last 8760 hours.  BMP: Recent Labs    03/09/24 0013 05/04/24 1359 05/25/24 1425 05/26/24 0439  NA 139 138 135 133*  K 3.3* 4.4 3.9 3.9  CL 103 103 101 102  CO2 24 24 23 23   GLUCOSE 91 81 106* 109*  BUN 9 6 9 8   CALCIUM  9.3 9.0 9.3 8.2*  CREATININE 0.91 1.12* 0.88 1.09*  GFRNONAA >60 >60 >60 >60    LIVER FUNCTION TESTS: Recent Labs    01/12/24 0941 03/09/24 0013 05/25/24 1425 05/26/24 0439  BILITOT 0.7 0.4 0.7 1.4*  AST 23 44* 188* 142*  ALT 9 50* 153* 159*  ALKPHOS 56 54 86 73  PROT 7.7 6.5 7.2 5.6*  ALBUMIN  4.5 4.1 4.3 2.8*    TUMOR MARKERS: No results for input(s): AFPTM, CEA, CA199, CHROMGRNA in the last 8760 hours.  Assessment and Plan:  38 y.o. female inpatient. History of GERD, MS.  Menorrhea s/p hysterectomy with right salpingectomy on 12.1.25. Presented to the ED at [facility] on 12.11.25 with abdominal pain. Found to have a pelvic abscess. CT abdomen pelvis from 12.11.25 reads 6.4 cm pelvic fluid collection, concerning for abscess. Team is requesting an abscess drain placement.  PLAN: IR Image Guided Pelvic Abscess Drain Placement  Risks and benefits discussed with the patient including bleeding,  infection, damage to adjacent structures, bowel perforation/fistula connection, and sepsis.  All of the patient's questions were answered, patient is agreeable to proceed. Consent signed and in chart.   Thank you for this interesting consult.  I greatly enjoyed meeting [name] and look forward to participating in their care.  A copy of this report was sent to the requesting provider on this date.  Electronically Signed: Delon JAYSON Beagle, NP 05/26/2024, 10:25 AM   I spent a total of 40 Minutes  in face to face in clinical consultation, greater than 50% of which was counseling/coordinating care for pelvic abscess drain placement.

## 2024-05-27 LAB — COMPREHENSIVE METABOLIC PANEL WITH GFR
ALT: 143 U/L — ABNORMAL HIGH (ref 0–44)
AST: 80 U/L — ABNORMAL HIGH (ref 15–41)
Albumin: 2.6 g/dL — ABNORMAL LOW (ref 3.5–5.0)
Alkaline Phosphatase: 109 U/L (ref 38–126)
Anion gap: 8 (ref 5–15)
BUN: 7 mg/dL (ref 6–20)
CO2: 27 mmol/L (ref 22–32)
Calcium: 7.8 mg/dL — ABNORMAL LOW (ref 8.9–10.3)
Chloride: 100 mmol/L (ref 98–111)
Creatinine, Ser: 1.16 mg/dL — ABNORMAL HIGH (ref 0.44–1.00)
GFR, Estimated: >60 mL/min (ref >60)
Glucose, Bld: 132 mg/dL — ABNORMAL HIGH (ref 70–99)
Potassium: 3.7 mmol/L (ref 3.5–5.1)
Sodium: 135 mmol/L (ref 135–145)
Total Bilirubin: 0.9 mg/dL (ref 0.0–1.2)
Total Protein: 5.5 g/dL — ABNORMAL LOW (ref 6.5–8.1)

## 2024-05-27 LAB — HEPATITIS PANEL, ACUTE
HCV Ab: NONREACTIVE
Hep A IgM: NONREACTIVE
Hep B C IgM: NONREACTIVE
Hepatitis B Surface Ag: NONREACTIVE

## 2024-05-27 LAB — BLOOD CULTURE ID PANEL (REFLEXED) - BCID2

## 2024-05-27 LAB — CBC
HCT: 23.5 % — ABNORMAL LOW (ref 36.0–46.0)
Hemoglobin: 7.5 g/dL — ABNORMAL LOW (ref 12.0–15.0)
MCH: 29.6 pg (ref 26.0–34.0)
MCHC: 31.9 g/dL (ref 30.0–36.0)
MCV: 92.9 fL (ref 80.0–100.0)
Platelets: 498 K/uL — ABNORMAL HIGH (ref 150–400)
RBC: 2.53 MIL/uL — ABNORMAL LOW (ref 3.87–5.11)
RDW: 12.5 % (ref 11.5–15.5)
WBC: 16 K/uL — ABNORMAL HIGH (ref 4.0–10.5)
nRBC: 0 % (ref 0.0–0.2)

## 2024-05-27 MED ORDER — SIMETHICONE 80 MG PO CHEW
80.0000 mg | CHEWABLE_TABLET | Freq: Four times a day (QID) | ORAL | Status: DC | PRN
  Administered 2024-05-27 – 2024-06-01 (×4): 80 mg via ORAL
  Filled 2024-05-27 (×5): qty 1

## 2024-05-27 NOTE — Progress Notes (Signed)
 Patient ID: [name], female   DOB: [birth year], 38 y.o.   MRN: [id number]  POD#12 TVH  [name] is a 38 y.o. female patient.     Past Medical History:  Diagnosis Date   Abnormal Pap smear    Colpo>normal   Anxiety    Cholestasis during pregnancy in third trimester 07/12/2020   07/11/20 Bile acids 13.8     Fibroid    GERD (gastroesophageal reflux disease)    Gonorrhea    Headache    last migraine 01/2017   History of IBS    watch diet, no meds   Infection    UTI   Multiple sclerosis    Neuromuscular disorder (HCC)    diagnosed with MS this visit   Obesity    Ovarian cyst    Pernicious anemia 05/02/2013   Pregnancy induced hypertension    Preterm labor    all 3 deliveries   SVD (spontaneous vaginal delivery)    x 3   Urinary tract infection    Vaginal Pap smear, abnormal     No past surgical history pertinent negatives on file.  Scheduled Meds:  ferrous sulfate   325 mg Oral QODAY   gabapentin   100 mg Oral BID   ketorolac   30 mg Intravenous Q6H   Or   ketorolac   30 mg Intramuscular Q6H   pantoprazole  (PROTONIX ) IV  40 mg Intravenous QHS   sodium chloride  flush  5 mL Intracatheter Q8H    Continuous Infusions:  piperacillin -tazobactam 3.375 g (05/27/24 0704)    PRN Meds:alum & mag hydroxide-simeth, cyclobenzaprine , guaiFENesin , HYDROmorphone  (DILAUDID ) injection, menthol , ondansetron  **OR** ondansetron  (ZOFRAN ) IV, oxyCODONE , polyethylene glycol, senna  Allergies[1]  Principal Problem:   Pelvic abscess in female Active Problems:   Acute relapsing multiple sclerosis (HCC)   Adynamic ileus (HCC)   Abnormal LFTs   Subjective   Pt feels fine, no complaints  Objective   Vitals:   05/27/24 0557 05/27/24 0818 05/27/24 0820 05/27/24 0841  BP:   (!) 113/52 (!) 123/52  Pulse:   89 88  Resp:   20   Temp:  100.2 F (37.9 C)  100.1 F (37.8 C)  TempSrc:  Oral  Oral  SpO2:   96% 98%  Weight: 113.6 kg      Vitals:   05/26/24 1252 05/26/24 1305  05/26/24 1310 05/26/24 1315  BP: 117/68 116/68 118/65 116/64   05/26/24 1330 05/26/24 1359 05/26/24 1437 05/26/24 1457  BP: 112/68 (!) 104/49 (!) 111/56 (!) 111/48   05/26/24 1528 05/26/24 2348 05/27/24 0820 05/27/24 0841  BP: (!) 100/56 (!) 116/56 (!) 113/52 (!) 123/52     Subjective Objective: Vital signs (most recent): Blood pressure (!) 123/52, pulse 88, temperature 100.1 F (37.8 C), temperature source Oral, resp. rate 20, weight 113.6 kg, last menstrual period 04/23/2024, SpO2 98%.   Gen  WDWN NAD Abdomen  soft completely benign Incision  na     Latest Ref Rng & Units 05/27/2024    4:42 AM 05/26/2024    4:39 AM 05/25/2024    2:25 PM  CBC  WBC 4.0 - 10.5 K/uL 16.0  19.8  11.9   Hemoglobin 12.0 - 15.0 g/dL 7.5  8.2  9.3   Hematocrit 36.0 - 46.0 % 23.5  25.5  29.4   Platelets 150 - 400 K/uL 498  535  641        Latest Ref Rng & Units 05/27/2024    4:42 AM 05/26/2024    4:39  AM 05/25/2024    2:25 PM  CMP  Glucose 70 - 99 mg/dL 867  890  893   BUN 6 - 20 mg/dL 7  8  9    Creatinine 0.44 - 1.00 mg/dL 8.83  8.90  9.11   Sodium 135 - 145 mmol/L 135  133  135   Potassium 3.5 - 5.1 mmol/L 3.7  3.9  3.9   Chloride 98 - 111 mmol/L 100  102  101   CO2 22 - 32 mmol/L 27  23  23    Calcium  8.9 - 10.3 mg/dL 7.8  8.2  9.3   Total Protein 6.5 - 8.1 g/dL 5.5  5.6  7.2   Total Bilirubin 0.0 - 1.2 mg/dL 0.9  1.4  0.7   Alkaline Phos 38 - 126 U/L 109  73  86   AST 15 - 41 U/L 80  142  188   ALT 0 - 44 U/L 143  159  153      Assessment & Plan  GYN: >benign exam, drain output is minimal Culture pending  CARDIAC: >  ID: >B fragilis grew out of BC, on zosyn , transition to augmentin x 14d  HEME: >anemia but essentially stable, asymptomatic, if has trouble with lingering infection consider transfusion  PULM: >  FEN/GI: >normal  PPx: >lovenox , scd  DISPO: >maybe home tomorrow if afebrile on augmentin   Vonn VEAR Inch, MD 05/27/2024, 9:24 AM        [1] No  Known Allergies

## 2024-05-27 NOTE — Progress Notes (Signed)
 [name] is an 37 y.o. female who presented to [facility] on 05/25/2024 with a chief complaint of abscess following hysterectomy  Assessment:  pelvic abscess following TVH on 05/15/24 (include suspected source if known)  Name of physician (or Provider) Contacted: FREDRIK Inch  Current antibiotics: zosyn   Changes to prescribed antibiotics recommended:  Patient is on recommended antibiotics - No changes needed Plan to switch to augmentin to go home today or tomorrow  Results for orders placed or performed during the hospital encounter of 05/25/24  Blood Culture ID Panel (Reflexed) (Collected: 05/25/2024  8:26 PM)  Result Value Ref Range   Enterococcus faecalis NOT DETECTED NOT DETECTED   Enterococcus Faecium NOT DETECTED NOT DETECTED   Listeria monocytogenes NOT DETECTED NOT DETECTED   Staphylococcus species NOT DETECTED NOT DETECTED   Staphylococcus aureus (BCID) NOT DETECTED NOT DETECTED   Staphylococcus epidermidis NOT DETECTED NOT DETECTED   Staphylococcus lugdunensis NOT DETECTED NOT DETECTED   Streptococcus species NOT DETECTED NOT DETECTED   Streptococcus agalactiae NOT DETECTED NOT DETECTED   Streptococcus pneumoniae NOT DETECTED NOT DETECTED   Streptococcus pyogenes NOT DETECTED NOT DETECTED   A.calcoaceticus-baumannii NOT DETECTED NOT DETECTED   Bacteroides fragilis DETECTED (A) NOT DETECTED   Enterobacterales NOT DETECTED NOT DETECTED   Enterobacter cloacae complex NOT DETECTED NOT DETECTED   Escherichia coli NOT DETECTED NOT DETECTED   Klebsiella aerogenes NOT DETECTED NOT DETECTED   Klebsiella oxytoca NOT DETECTED NOT DETECTED   Klebsiella pneumoniae NOT DETECTED NOT DETECTED   Proteus species NOT DETECTED NOT DETECTED   Salmonella species NOT DETECTED NOT DETECTED   Serratia marcescens NOT DETECTED NOT DETECTED   Haemophilus influenzae NOT DETECTED NOT DETECTED    Neisseria meningitidis NOT DETECTED NOT DETECTED   Pseudomonas aeruginosa NOT DETECTED NOT DETECTED   Stenotrophomonas maltophilia NOT DETECTED NOT DETECTED   Candida albicans NOT DETECTED NOT DETECTED   Candida auris NOT DETECTED NOT DETECTED   Candida glabrata NOT DETECTED NOT DETECTED   Candida krusei NOT DETECTED NOT DETECTED   Candida parapsilosis NOT DETECTED NOT DETECTED   Candida tropicalis NOT DETECTED NOT DETECTED   Cryptococcus neoformans/gattii NOT DETECTED NOT DETECTED    Allison Deshotels Scarlett 05/27/2024  8:27 AM

## 2024-05-28 DIAGNOSIS — N739 Female pelvic inflammatory disease, unspecified: Secondary | ICD-10-CM | POA: Diagnosis not present

## 2024-05-28 LAB — COMPREHENSIVE METABOLIC PANEL WITH GFR
ALT: 100 U/L — ABNORMAL HIGH (ref 0–44)
AST: 49 U/L — ABNORMAL HIGH (ref 15–41)
Albumin: 2.4 g/dL — ABNORMAL LOW (ref 3.5–5.0)
Alkaline Phosphatase: 133 U/L — ABNORMAL HIGH (ref 38–126)
Anion gap: 7 (ref 5–15)
BUN: 8 mg/dL (ref 6–20)
CO2: 28 mmol/L (ref 22–32)
Calcium: 8.1 mg/dL — ABNORMAL LOW (ref 8.9–10.3)
Chloride: 100 mmol/L (ref 98–111)
Creatinine, Ser: 1.12 mg/dL — ABNORMAL HIGH (ref 0.44–1.00)
GFR, Estimated: >60 mL/min (ref >60)
Glucose, Bld: 122 mg/dL — ABNORMAL HIGH (ref 70–99)
Potassium: 3.7 mmol/L (ref 3.5–5.1)
Sodium: 135 mmol/L (ref 135–145)
Total Bilirubin: 1.2 mg/dL (ref 0.0–1.2)
Total Protein: 5.6 g/dL — ABNORMAL LOW (ref 6.5–8.1)

## 2024-05-28 LAB — CBC
HCT: 23.8 % — ABNORMAL LOW (ref 36.0–46.0)
Hemoglobin: 7.5 g/dL — ABNORMAL LOW (ref 12.0–15.0)
MCH: 29.3 pg (ref 26.0–34.0)
MCHC: 31.5 g/dL (ref 30.0–36.0)
MCV: 93 fL (ref 80.0–100.0)
Platelets: 534 K/uL — ABNORMAL HIGH (ref 150–400)
RBC: 2.56 MIL/uL — ABNORMAL LOW (ref 3.87–5.11)
RDW: 12.7 % (ref 11.5–15.5)
WBC: 14.1 K/uL — ABNORMAL HIGH (ref 4.0–10.5)
nRBC: 0 % (ref 0.0–0.2)

## 2024-05-28 LAB — CULTURE, BLOOD (ROUTINE X 2): Special Requests: ADEQUATE

## 2024-05-28 MED ORDER — ACETAMINOPHEN 325 MG PO TABS
650.0000 mg | ORAL_TABLET | Freq: Four times a day (QID) | ORAL | Status: DC | PRN
  Administered 2024-05-28 – 2024-05-30 (×4): 650 mg via ORAL
  Filled 2024-05-28 (×5): qty 2

## 2024-05-28 MED ORDER — SODIUM CHLORIDE FLUSH 0.9 % IV SOLN: 10.0000 mL | Freq: Every day | INTRAVENOUS | 0 refills | Status: DC

## 2024-05-28 MED ORDER — VANCOMYCIN HCL 2000 MG/400ML IV SOLN
2000.0000 mg | Freq: Once | INTRAVENOUS | Status: AC
  Administered 2024-05-28: 2000 mg via INTRAVENOUS
  Filled 2024-05-28: qty 400

## 2024-05-28 MED ORDER — VANCOMYCIN HCL 1500 MG/300ML IV SOLN
1500.0000 mg | INTRAVENOUS | Status: DC
  Administered 2024-05-29 – 2024-05-30 (×2): 1500 mg via INTRAVENOUS
  Filled 2024-05-28 (×4): qty 300

## 2024-05-28 MED ORDER — FERROUS SULFATE 325 (65 FE) MG PO TABS: 325.0000 mg | ORAL_TABLET | ORAL | 1 refills | Status: AC

## 2024-05-28 MED ORDER — SODIUM CHLORIDE FLUSH 0.9 % IV SOLN
10.0000 mL | Freq: Every day | INTRAVENOUS | 0 refills | Status: AC
  Filled 2024-05-28 – 2024-05-29 (×2): qty 600, 60d supply, fill #0

## 2024-05-28 MED ORDER — MAGNESIUM CITRATE PO SOLN
0.5000 | Freq: Once | ORAL | Status: AC
  Administered 2024-05-28: 0.5 via ORAL
  Filled 2024-05-28: qty 296

## 2024-05-28 MED ORDER — OXYCODONE HCL 5 MG PO TABS: 5.0000 mg | ORAL_TABLET | ORAL | 0 refills | Status: DC | PRN

## 2024-05-28 NOTE — Progress Notes (Signed)
 Referring Provider(s): Dr. Vina Solian, MD  Supervising Physician: Jennefer Rover  Patient Status:  [facility] - In-pt  Chief Complaint: Post-op problem; Vaginal bleeding; Pelvic abscess s/p trasngluteal drain placement on 12/12 by Dr. Karalee.  Brief History:  [name] is a 38 y.o. female  inpatient. History of GERD, MS.  Menorrhea s/p hysterectomy with right salpingectomy on 12.1.25. Presented to the ED at [facility] on 12.11.25 with abdominal pain. Found to have a pelvic abscess. CT abdomen pelvis from 12.11.25 reads 6.4 cm pelvic fluid collection, concerning for abscess. IR was requested for drain placement, which patient received on 12/12. Fluid collection noted to likely be a post-op hematoma rather than abscess.  Subjective:  Patient alert and laying in bed, calm. She is tearful as she is unable to go home today due to fever. Currently without any significant complaints. She has expected discomfort from drain placement, but otherwise feels well. Patient denies any headache, chest pain, SOB, cough, abdominal pain, nausea, vomiting or bleeding.    Allergies: Patient has no known allergies.  Medications: Prior to Admission medications  Medication Sig Start Date End Date Taking? Authorizing Provider  cyanocobalamin  (VITAMIN B12) 1000 MCG/ML injection Inject 1,000 mcg into the skin every 30 (thirty) days.   Yes [provider]  cyclobenzaprine  (FLEXERIL ) 10 MG tablet Take 5-10 mg by mouth 3 (three) times daily as needed for muscle spasms.   Yes [provider]  famotidine  (PEPCID ) 20 MG tablet Take 1 tablet (20 mg total) by mouth 2 (two) times daily. Patient taking differently: Take 20 mg by mouth daily as needed for heartburn or indigestion. 01/12/24  Yes Geiple, Joshua, PA-C  ocrelizumab (OCREVUS) 300 MG/10ML injection Inject 300 mg into the vein every 6 (six) months.   Yes [provider]  ondansetron  (ZOFRAN -ODT) 4 MG disintegrating tablet Take 1 tablet  (4 mg total) by mouth every 6 (six) hours as needed for nausea. 05/16/24  Yes Constant, Peggy, MD  pantoprazole  (PROTONIX ) 40 MG tablet Take 40 mg by mouth daily as needed (acid reflux).   Yes [provider]  sodium chloride  flush 0.9 % SOLN injection 10 mLs by Intracatheter route daily. Please flush catheter daily with 5-10 mL of saline. 05/28/24 07/27/24 Yes Raahim Shartzer A, PA-C  Vitamin D , Ergocalciferol , (DRISDOL ) 1.25 MG (50000 UNIT) CAPS capsule Take 50,000 Units by mouth once a week.   Yes [provider]  ferrous sulfate  325 (65 FE) MG tablet Take 1 tablet (325 mg total) by mouth every other day. 05/29/24   Erik Kieth BROCKS, MD  oxyCODONE  (OXY IR/ROXICODONE ) 5 MG immediate release tablet Take 1 tablet (5 mg total) by mouth every 4 (four) hours as needed for breakthrough pain. 05/28/24   Erik Kieth BROCKS, MD     Vital Signs: BP 130/69 (BP Location: Left Arm)   Pulse 83   Temp 98.4 F (36.9 C) (Oral)   Resp 17   Wt 252 lb 9.6 oz (114.6 kg)   LMP 04/23/2024 (Approximate)   SpO2 98%   BMI 43.36 kg/m   Physical Exam Constitutional:      Appearance: Normal appearance.  Cardiovascular:     Rate and Rhythm: Normal rate.  Pulmonary:     Effort: Pulmonary effort is normal.  Musculoskeletal:        General: Normal range of motion.     Comments: Right TG drain appropriately dressed. Dressing is clean, dry, intact. Drain incision site non-tender, without evidence of infection. Retaining suture and  Stat Lock in place.  ~15 mL serosanguinous output in collection bulb. Line flushes well.    Skin:    General: Skin is warm and dry.  Neurological:     Mental Status: She is alert and oriented to person, place, and time.      Labs:  CBC: Recent Labs    05/25/24 1425 05/26/24 0439 05/27/24 0442 05/28/24 0441  WBC 11.9* 19.8* 16.0* 14.1*  HGB 9.3* 8.2* 7.5* 7.5*  HCT 29.4* 25.5* 23.5* 23.8*  PLT 641* 535* 498* 534*    COAGS: No results for input(s):  INR, APTT in the last 8760 hours.  BMP: Recent Labs    05/25/24 1425 05/26/24 0439 05/27/24 0442 05/28/24 0441  NA 135 133* 135 135  K 3.9 3.9 3.7 3.7  CL 101 102 100 100  CO2 23 23 27 28   GLUCOSE 106* 109* 132* 122*  BUN 9 8 7 8   CALCIUM  9.3 8.2* 7.8* 8.1*  CREATININE 0.88 1.09* 1.16* 1.12*  GFRNONAA >60 >60 >60 >60    LIVER FUNCTION TESTS: Recent Labs    05/25/24 1425 05/26/24 0439 05/27/24 0442 05/28/24 0441  BILITOT 0.7 1.4* 0.9 1.2  AST 188* 142* 80* 49*  ALT 153* 159* 143* 100*  ALKPHOS 86 73 109 133*  PROT 7.2 5.6* 5.5* 5.6*  ALBUMIN  4.3 2.8* 2.6* 2.4*    Assessment and Plan:  Pelvic abscess s/p transgluteal drain placement.   Drain Location: right transgluteal. Size: Fr size: 12 Fr Date of placement: 05/26/2024  Currently to: Drain collection device: suction bulb 24 hour output:  Output by Drain (mL) 05/26/24 0701 - 05/26/24 1900 05/26/24 1901 - 05/27/24 0700 05/27/24 0701 - 05/27/24 1900 05/27/24 1901 - 05/28/24 0700 05/28/24 0701 - 05/28/24 1402  Closed System Drain 1 Inferior;Right;Medial Back Bulb (JP) 12 Fr. 7 40 15 20     Interval imaging/drain manipulation:  None.  Current examination: Flushes/aspirates easily.  Insertion site unremarkable. Suture and stat lock in place. Dressed appropriately.   Plan: Continue TID flushes with 5 cc NS. Record output Q shift. Dressing changes QD or PRN if soiled.  Call IR APP or on call IR MD if difficulty flushing or sudden change in drain output.  Repeat imaging/possible drain injection once output < 10 mL/QD (excluding flush material). Consideration for drain removal if output is < 10 mL/QD (excluding flush material), pending discussion with the providing surgical service.  Discharge planning: IR discharge/outpatient follow orders placed. Patient to flush drain once daily with 5-10 mL saline. Patient is prescribed saline flushes to be picked up at [facility] outpatient pharmacy.  Patient to  change dressings every 48-72 hours, or any time soiled or wet. Patient is rounded on daily by RN secondary to MS diagnosis.  Patient to maintain a daily drain output log, to bring at outpatient follow-up apointment. Log available on printed AVS. Teaching provided to patient on drain care. Per patient request, a video detailing flushing step was made on patient's phone for later review by RN. Printed direction also available on AVS once printed. Patient will be followed-up in 10-14 days s/p discharge in clinic for drain follow-up with imaging and possible drain injection Patient know to wait for IR schedulers to call for appointment, or to call in 10 days is she has not been scheduled by the. All questions answered.   IR will continue to follow - please call with questions or concerns.   Thank you for this interesting consult.  I greatly enjoyed meeting Abbigaile  L Kashuba and look forward to participating in their care.   Electronically Signed: Carlin DELENA Griffon, PA-C 05/28/2024, 1:37 PM     I spent a total of 15 Minutes at the the patient's bedside AND on the patient's hospital floor or unit, greater than 50% of which was counseling/coordinating care for pelvic abscess drain care and follow-up.

## 2024-05-28 NOTE — Progress Notes (Signed)
 Pharmacy Antibiotic Note  [name] is a 38 y.o. female admitted on 05/25/2024 with pelvic abscess s/p TVH on 12/1.  Pt has + Blood culture for B. Fragilis and + fluid culture for E.coli. Pharmacy has been consulted for Vancomycin  dosing.  Plan: Vancomycin  2000mg  IV x1 then 1500mg  IV q24h based on AUC dosing.  Calculated AUC: 501 Calculated Css max: 36.6 Calculated Css min: 11.3 Will continue to monitor renal function since pt is also receiving Zosyn  and SCr > 1  Weight: 114.6 kg (252 lb 9.6 oz)  Temp (24hrs), Avg:99.9 F (37.7 C), Min:98.4 F (36.9 C), Max:101.6 F (38.7 C)  Recent Labs  Lab 05/25/24 1425 05/25/24 2012 05/26/24 0439 05/27/24 0442 05/28/24 0441  WBC 11.9*  --  19.8* 16.0* 14.1*  CREATININE 0.88  --  1.09* 1.16* 1.12*  LATICACIDVEN  --  0.8  --   --   --     Estimated Creatinine Clearance: 83 mL/min (A) (by C-G formula based on SCr of 1.12 mg/dL (H)).    Allergies[1]  Antimicrobials this admission: Zosyn  3.375g IV q8h 12/11 >>   Microbiology results: 12/14 BCx: pending 12/11 BCx: B.fragilis x 2 12/12 Wound: E.Coli   Thank you for allowing pharmacy to be a part of this patients care.  Clayborne Alfonso MATSU 05/28/2024 9:35 PM     [1] No Known Allergies

## 2024-05-28 NOTE — Progress Notes (Addendum)
 FACULTY PRACTICE COMPREHENSIVE PROGRESS NOTE  DERRA SHARTZER is a 38 y.o. [id number] POD13 s/p TVH/RS admitted for post op abscess vs hematoma and 2d s/p drain placement by IR  Length of Stay:  3 Days. Admitted 05/25/2024  Subjective: Doing well without complaint. Pain well controlled. Tolerating regular diet without nausea/vomiting. Ambulating. Denies fevers/chills. Would like to be discharged home today  Vitals:  Blood pressure 110/61, pulse 87, temperature 98.7 F (37.1 C), temperature source Oral, resp. rate 16, weight 114.6 kg, last menstrual period 04/23/2024, SpO2 95%. Physical Examination: CONSTITUTIONAL: Well-developed, well-nourished female in no acute distress.  CARDIOVASCULAR: Normal heart rate noted RESPIRATORY: Effort normal, no problems with respiration noted MUSCULOSKELETAL: Normal range of motion. No edema and no tenderness. 2+ distal pulses. ABDOMEN: Soft, nontender, nondistended DRAIN: scant serosanguinous fluid present, 24h output 35cc  Results for orders placed or performed during the hospital encounter of 05/25/24 (from the past 48 hours)  Aerobic/Anaerobic Culture w Gram Stain (surgical/deep wound)     Status: None (Preliminary result)   Collection Time: 05/26/24  1:23 PM   Specimen: Abscess  Result Value Ref Range   Specimen Description ABSCESS    Special Requests Normal    Gram Stain      FEW WBC PRESENT, PREDOMINANTLY PMN NO ORGANISMS SEEN    Culture      RARE GRAM NEGATIVE RODS IDENTIFICATION AND SUSCEPTIBILITIES TO FOLLOW Performed at [facility], [address] [place], [place] [address]    Report Status PENDING   CBC     Status: Abnormal   Collection Time: 05/27/24  4:42 AM  Result Value Ref Range   WBC 16.0 (H) 4.0 - 10.5 K/uL   RBC 2.53 (L) 3.87 - 5.11 MIL/uL   Hemoglobin 7.5 (L) 12.0 - 15.0 g/dL   HCT 76.4 (L) 63.9 - 53.9 %   MCV 92.9 80.0 - 100.0 fL   MCH 29.6 26.0 - 34.0 pg   MCHC 31.9 30.0 - 36.0 g/dL   RDW 87.4 88.4 - 84.4 %    Platelets 498 (H) 150 - 400 K/uL   nRBC 0.0 0.0 - 0.2 %    Comment: Performed at [facility], [address], [place], [place] [address]  Comprehensive metabolic panel     Status: Abnormal   Collection Time: 05/27/24  4:42 AM  Result Value Ref Range   Sodium 135 135 - 145 mmol/L   Potassium 3.7 3.5 - 5.1 mmol/L   Chloride 100 98 - 111 mmol/L   CO2 27 22 - 32 mmol/L   Glucose, Bld 132 (H) 70 - 99 mg/dL    Comment: Glucose reference range applies only to samples taken after fasting for at least 8 hours.   BUN 7 6 - 20 mg/dL   Creatinine, Ser 8.83 (H) 0.44 - 1.00 mg/dL   Calcium  7.8 (L) 8.9 - 10.3 mg/dL   Total Protein 5.5 (L) 6.5 - 8.1 g/dL   Albumin  2.6 (L) 3.5 - 5.0 g/dL   AST 80 (H) 15 - 41 U/L   ALT 143 (H) 0 - 44 U/L   Alkaline Phosphatase 109 38 - 126 U/L   Total Bilirubin 0.9 0.0 - 1.2 mg/dL   GFR, Estimated >39 >39 mL/min    Comment: (NOTE) Calculated using the CKD-EPI Creatinine Equation (2021)    Anion gap 8 5 - 15    Comment: Performed at [facility], [address], [place], [place] [address]  Hepatitis panel, acute  Status: None   Collection Time: 05/27/24  4:42 AM  Result Value Ref Range   Hepatitis B Surface Ag NON REACTIVE NON REACTIVE   HCV Ab NON REACTIVE NON REACTIVE    Comment: (NOTE) Nonreactive HCV antibody screen is consistent with no HCV infections,  unless recent infection is suspected or other evidence exists to indicate HCV infection.     Hep A IgM NON REACTIVE NON REACTIVE   Hep B C IgM NON REACTIVE NON REACTIVE    Comment: Performed at [facility], [address], [place], [place] [address]  CBC     Status: Abnormal   Collection Time: 05/28/24  4:41 AM  Result Value Ref Range   WBC 14.1 (H) 4.0 - 10.5 K/uL   RBC 2.56 (L) 3.87 - 5.11 MIL/uL   Hemoglobin 7.5 (L) 12.0 - 15.0 g/dL   HCT 76.1 (L) 63.9 - 53.9 %   MCV 93.0 80.0 - 100.0 fL   MCH 29.3 26.0 - 34.0 pg   MCHC 31.5 30.0 - 36.0 g/dL   RDW 87.2 88.4 - 84.4 %    Platelets 534 (H) 150 - 400 K/uL   nRBC 0.0 0.0 - 0.2 %    Comment: Performed at [facility], [address] [place], [place] [address]  Comprehensive metabolic panel     Status: Abnormal   Collection Time: 05/28/24  4:41 AM  Result Value Ref Range   Sodium 135 135 - 145 mmol/L   Potassium 3.7 3.5 - 5.1 mmol/L   Chloride 100 98 - 111 mmol/L   CO2 28 22 - 32 mmol/L   Glucose, Bld 122 (H) 70 - 99 mg/dL    Comment: Glucose reference range applies only to samples taken after fasting for at least 8 hours.   BUN 8 6 - 20 mg/dL   Creatinine, Ser 8.87 (H) 0.44 - 1.00 mg/dL   Calcium  8.1 (L) 8.9 - 10.3 mg/dL   Total Protein 5.6 (L) 6.5 - 8.1 g/dL   Albumin  2.4 (L) 3.5 - 5.0 g/dL   AST 49 (H) 15 - 41 U/L   ALT 100 (H) 0 - 44 U/L   Alkaline Phosphatase 133 (H) 38 - 126 U/L   Total Bilirubin 1.2 0.0 - 1.2 mg/dL   GFR, Estimated >39 >39 mL/min    Comment: (NOTE) Calculated using the CKD-EPI Creatinine Equation (2021)    Anion gap 7 5 - 15    Comment: Performed at [facility], [address], [place], [place] [address]    CT GUIDED SOFT TISSUE FLUID DRAIN BY PERC CATH Result Date: 05/26/2024 INDICATION: 38 year old female with postoperative pelvic fluid collection following hysterectomy. She presents for CT-guided drain placement. EXAM: CT-guided drain placement MEDICATIONS: The patient is currently admitted to the hospital and receiving intravenous antibiotics. The antibiotics were administered within an appropriate time frame prior to the initiation of the procedure. ANESTHESIA/SEDATION: Moderate (conscious) sedation was employed during this procedure. A total of Versed  3 mg and Fentanyl  100 mcg was administered intravenously by the radiology nurse. Total intra-service moderate Sedation Time: 13 minutes. The patient's level of consciousness and vital signs were monitored continuously by radiology nursing throughout the procedure under my direct supervision. COMPLICATIONS: None  immediate. PROCEDURE: Informed written consent was obtained from the patient after a thorough discussion of the procedural risks, benefits and alternatives. All questions were addressed. Maximal Sterile Barrier Technique was utilized including caps, mask, sterile gowns, sterile gloves, sterile drape, hand hygiene and  skin antiseptic. A timeout was performed prior to the initiation of the procedure. A planning axial CT scan was performed. The complex high attenuation fluid collection in the anatomic pelvis was identified and localized. A suitable skin entry site was selected and marked. Local anesthesia was attained by infiltration with 1% lidocaine . A small dermatotomy was made. Under intermittent CT guidance, a 15 cm trocar needle was carefully advanced along a right parasacral approach through the sacro tuberous ligament and into the complex fluid collection. Next, a wire was coiled in the fluid collection. The needle was removed. The tract was dilated to 75 French. A 12 French all-purpose drainage catheter was then advanced and formed. Aspiration yields approximately 8 mL of bloody fluid. This was sent for Gram stain and culture. The drainage catheter was then flushed and connected to JP bulb suction before being secured to the skin with 0 Prolene suture. IMPRESSION: Successful placement of a 69 French drain via a right transgluteal approach with evacuation of 8 mL bloody fluid. It appears the collection represents postoperative hematoma rather than frank abscess. The aspirated blood was sent for Gram stain and culture to exclude superimposed infection. Electronically Signed   By: Wilkie Lent M.D.   On: 05/26/2024 14:28    Current scheduled medications  ferrous sulfate   325 mg Oral QODAY   gabapentin   100 mg Oral BID   pantoprazole  (PROTONIX ) IV  40 mg Intravenous QHS   sodium chloride  flush  5 mL Intracatheter Q8H    I have reviewed the patient's current medications.  ASSESSMENT: Principal  Problem:   Pelvic abscess in female Active Problems:   Acute relapsing multiple sclerosis (HCC)   Adynamic ileus (HCC)   Abnormal LFTs Continues to clinically improve, has remained afebrile for > 24h. Leukocytosis, transaminitis, and AKI are all improving as well.   PLAN: Transition to augmentin x 14d Follow up with IR for repeat imaging/drain removal - IR team notified of plan for discharge to help with coordination of care  Dispo: anticipate discharge home today  Kieth Carolin, MD Obstetrician & Gynecologist, [facility] for [facility], [facility]   Addendum We were called and notified that patient had a fever of 101.6 at 0800.  Also complaining of constipation.Mild abdominal pain. No urinary or respiratory symptoms. Blood cultures x 2 obtained.  Antipyretic medication given to patient. Talked to Pharmacy; previous blood cultures showed E.coli and abscess cultures grew B.fragilis. Both are covered by Zosyn .  Will continue this for now.  If fevers continue, will consider adding other antibiotics vs rescanning to make sure there are no loculated portions of the abscess that need further intervention. Patient was given medications for her constipation, will monitor effect.  She is passing flatus, no N/V, just no BM for days. Discharge order cancelled, patient is aware that discharge will only be considered if she is at least 48 hours afebrile and no further complications/concerning symptoms.  GLORIS HUGGER, MD, FACOG Obstetrician & Gynecologist, [facility] for [employer], [facility]

## 2024-05-28 NOTE — Discharge Instructions (Signed)
 Interventional Radiology Percutaneous Abscess Drain Placement After Care   This sheet gives you information about how to care for yourself after your procedure. Your health care provider may also give you more specific instructions. Your drain was placed by an interventional radiologist with [facility]. If you have questions or concerns, contact [facility] at [phone].   What is a percutaneous drain?   A drain is a small plastic tube (catheter) that goes into the fluid collection in your body through your skin.   How long will I need the drain?   How long the drain needs to stay in is determined by where the drain is, how much comes out of the drain each day and if you are having any other surgical procedures.   Interventional radiology will determine when it is time to remove the drain. It is important to follow up as directed so that the drain can be removed as soon as it is safe to do so.   What can I expect after the procedure?   After the procedure, it is common to have:   A small amount of bruising and discomfort in the area where the drainage tube (catheter) was placed.   Sleepiness and fatigue. This should go away after the medicines you were given have worn off.   Follow these instructions at home:   Insertion site care   Check your insertion site when you change the bandage. Check for:   More redness, swelling, or pain.   More fluid or blood.   Warmth.   Pus or a bad smell.   When caring for your insertion site:   Wash your hands with soap and water for at least 20 seconds before and after you change your bandage (dressing). If soap and water are not available, use hand sanitizer.   You do not need to change your dressing everyday if it is clean and dry. Change your dressing every 3 days or as needed when it is soiled, wet or becoming dislodged. You will need to change your dressing each time you shower.   Leave stitches (sutures), skin  glue, or adhesive strips in place. These skin closures may need to stay in place for 2 weeks or longer. If adhesive strip edges start to loosen and curl up, you may trim the loose edges. Do not remove adhesive strips completely unless your health care provider tells you to do so.   Catheter care   Flush the catheter once per day with 5 mL of 0.9% normal saline unless you are told otherwise by your healthcare provider. This helps to prevent clogs in the catheter.   To disconnect the drain, turn the clear plastic tube to the left. Attach the saline syringe by placing it on the white end of the drain and turning gently to the right. Once attached gently push the plunger to the 5 mL mark. After you are done flushing, disconnect the syringe by turning to the left and reattach your drainage container   If you have a bulb please be sure the bulb is charged after reconnecting it - to do this pinch the bulb between your thumb and first finger and close the stopper located on the top of the bulb.    Check for fluid leaking from around your catheter (instead of fluid draining through your catheter). This may be a sign that the drain is no longer working correctly.   Write down the following information every time you empty your  bag:   The date and time.   The amount of drainage.   Activity   Rest at home for 1-2 days after your procedure.   For the first 48 hours do not lift anything more than 10 lbs (about a gallon of milk). You may perform moderate activities/exercise. Please avoid strenuous activities during this time.   Avoid any activities which may pull on your drain as this can cause your drain to become dislodged.   If you were given a sedative during the procedure, it can affect you for several hours. Do not drive or operate machinery until your health care provider says that it is safe.   General instructions   For mild pain take over-the-counter medications as needed for pain such as  Tylenol or Advil. If you are experiencing severe pain please call our office as this may indicate an issue with your drain.    If you were prescribed an antibiotic medicine, take it as told by your health care provider. Do not stop using the antibiotic even if you start to feel better.   You may shower 24 hours after the drain is placed. To do this cover the insertion site with a water tight material such as saran wrap and seal the edges with tape, you may also purchase waterproof dressings at your local drug store. Shower as usual and then remove the water tight dressing and any gauze/tape underneath it once you have exited the shower and dried off. Allow the area to air dry or pat dry with a clean towel. Once the skin is completely dry place a new gauze dressing. It is important to keep the site dry at all times to prevent infection.   Do not submerge the drain - this means you cannot take baths, swim, use a hot tub, etc. until the drain is removed.    Do not use any products that contain nicotine or tobacco, such as cigarettes, e-cigarettes, and chewing tobacco. If you need help quitting, ask your health care provider.   Keep all follow-up visits as told by your health care provider. This is important.   Contact a health care provider if:   You have less than 10 mL of drainage a day for 2-3 days in a row, or as directed by your health care provider.   You have any of these signs of infection:   More redness, swelling, or pain around your incision area.   More fluid or blood coming from your incision area.   Warmth coming from your incision area.   Pus or a bad smell coming from your incision area.   You have fluid leaking from around your catheter (instead of through your catheter).   You are unable to flush the drain.   You have a fever or chills.   You have pain that does not get better with medicine.   You have not been contacted to schedule a drain follow up appointment  within 10 days of discharge from the hospital.   Please call [facility] at [phone] with any questions or concerns.   Get help right away if:   Your catheter comes out.   You suddenly stop having drainage from your catheter.   You suddenly have blood in the fluid that is draining from your catheter.   You become dizzy or you faint.   You develop a rash.   You have nausea or vomiting.   You have difficulty breathing or you feel short  of breath.   You develop chest pain.   You have problems with your speech or vision.   You have trouble balancing or moving your arms or legs.   Summary   It is common to have a small amount of bruising and discomfort in the area where the drainage tube (catheter) was placed. You may also have minor discomfort with movement while the drain is in place.   Flush the drain once per day with 5 mL of 0.9% normal saline (unless you were told otherwise by your healthcare provider).    Record the amount of drainage from the bag every time you empty it.   Change the dressing every 3 days or earlier if soiled/wet. Keep the skin dry under the dressing.   You may shower with the drain in place. Do not submerge the drain (no baths, swimming, hot tubs, etc.).   Contact [facility] at [phone] if you have more redness, swelling, or pain around your incision area or if you have pain that does not get better with medicine.   This information is not intended to replace advice given to you by your health care provider. Make sure you discuss any questions you have with your health care provider.   Document Revised: 09/04/2021 Document Reviewed: 05/27/2019   Elsevier Patient Education  2023 [employer]         Interventional Radiology Drain Record   Empty your drain at least once per day. You may empty it as often as needed. Use this form to write down the amount of fluid that has collected in the drainage container. Bring  this form with you to your follow-up visits. Please call [facility] at [phone] with any questions or concerns prior to your appointment.   Drain #1 location: ___________________   Date __________ Time __________ Amount __________   Date __________ Time __________ Amount __________   Date __________ Time __________ Amount __________   Date __________ Time __________ Amount __________   Date __________ Time __________ Amount __________   Date __________ Time __________ Amount __________   Date __________ Time __________ Amount __________   Date __________ Time __________ Amount __________   Date __________ Time __________ Amount __________   Date __________ Time __________ Amount __________   Date __________ Time __________ Amount __________   Date __________ Time __________ Amount __________   Date __________ Time __________ Amount __________   Date __________ Time __________ Amount __________

## 2024-05-29 ENCOUNTER — Inpatient Hospital Stay (HOSPITAL_COMMUNITY)

## 2024-05-29 ENCOUNTER — Other Ambulatory Visit (HOSPITAL_COMMUNITY): Payer: Self-pay

## 2024-05-29 LAB — COMPREHENSIVE METABOLIC PANEL WITH GFR
ALT: 104 U/L — ABNORMAL HIGH (ref 0–44)
AST: 74 U/L — ABNORMAL HIGH (ref 15–41)
Albumin: 2.3 g/dL — ABNORMAL LOW (ref 3.5–5.0)
Alkaline Phosphatase: 164 U/L — ABNORMAL HIGH (ref 38–126)
Anion gap: 9 (ref 5–15)
BUN: <5 mg/dL — ABNORMAL LOW (ref 6–20)
CO2: 26 mmol/L (ref 22–32)
Calcium: 8.4 mg/dL — ABNORMAL LOW (ref 8.9–10.3)
Chloride: 102 mmol/L (ref 98–111)
Creatinine, Ser: 0.96 mg/dL (ref 0.44–1.00)
GFR, Estimated: >60 mL/min (ref >60)
Glucose, Bld: 94 mg/dL (ref 70–99)
Potassium: 3.9 mmol/L (ref 3.5–5.1)
Sodium: 137 mmol/L (ref 135–145)
Total Bilirubin: 1.6 mg/dL — ABNORMAL HIGH (ref 0.0–1.2)
Total Protein: 5.8 g/dL — ABNORMAL LOW (ref 6.5–8.1)

## 2024-05-29 LAB — AEROBIC/ANAEROBIC CULTURE W GRAM STAIN (SURGICAL/DEEP WOUND): Special Requests: NORMAL

## 2024-05-29 LAB — CBC
HCT: 23.9 % — ABNORMAL LOW (ref 36.0–46.0)
Hemoglobin: 7.3 g/dL — ABNORMAL LOW (ref 12.0–15.0)
MCH: 28.4 pg (ref 26.0–34.0)
MCHC: 30.5 g/dL (ref 30.0–36.0)
MCV: 93 fL (ref 80.0–100.0)
Platelets: 618 K/uL — ABNORMAL HIGH (ref 150–400)
RBC: 2.57 MIL/uL — ABNORMAL LOW (ref 3.87–5.11)
RDW: 13 % (ref 11.5–15.5)
WBC: 10.2 K/uL (ref 4.0–10.5)
nRBC: 0 % (ref 0.0–0.2)

## 2024-05-29 MED ORDER — IOHEXOL 9 MG/ML PO SOLN
500.0000 mL | ORAL | Status: AC
  Administered 2024-05-29: 18:00:00 500 mL via ORAL

## 2024-05-29 MED ORDER — DOCUSATE SODIUM 100 MG PO CAPS
100.0000 mg | ORAL_CAPSULE | Freq: Two times a day (BID) | ORAL | Status: DC
  Administered 2024-05-29 – 2024-05-30 (×2): 100 mg via ORAL
  Filled 2024-05-29 (×3): qty 1

## 2024-05-29 MED ORDER — IOHEXOL 9 MG/ML PO SOLN
ORAL | Status: AC
  Filled 2024-05-29: qty 1000

## 2024-05-29 MED ORDER — IOHEXOL 350 MG/ML SOLN
75.0000 mL | Freq: Once | INTRAVENOUS | Status: AC | PRN
  Administered 2024-05-29: 20:00:00 75 mL via INTRAVENOUS

## 2024-05-29 NOTE — Plan of Care (Signed)

## 2024-05-29 NOTE — Progress Notes (Signed)
 Patient ID: [name], female   DOB: [birth year], 38 y.o.   MRN: [id number]   * No surgery found *  [name] is a 38 y.o. female patient.   1. Intraabdominal fluid collection   2. Ileus (HCC)   3. Pelvic abscess in female     Past Medical History:  Diagnosis Date   Abnormal Pap smear    Colpo>normal   Anxiety    Cholestasis during pregnancy in third trimester 07/12/2020   07/11/20 Bile acids 13.8     Fibroid    GERD (gastroesophageal reflux disease)    Gonorrhea    Headache    last migraine 01/2017   History of IBS    watch diet, no meds   Infection    UTI   Multiple sclerosis    Neuromuscular disorder (HCC)    diagnosed with MS this visit   Obesity    Ovarian cyst    Pernicious anemia 05/02/2013   Pregnancy induced hypertension    Preterm labor    all 3 deliveries   SVD (spontaneous vaginal delivery)    x 3   Urinary tract infection    Vaginal Pap smear, abnormal     No past surgical history pertinent negatives on file.  Scheduled Meds:  ferrous sulfate   325 mg Oral QODAY   gabapentin   100 mg Oral BID   pantoprazole  (PROTONIX ) IV  40 mg Intravenous QHS   sodium chloride  flush  5 mL Intracatheter Q8H    Continuous Infusions:  piperacillin -tazobactam 3.375 g (05/29/24 0644)   vancomycin       PRN Meds:acetaminophen , alum & mag hydroxide-simeth, cyclobenzaprine , guaiFENesin , HYDROmorphone  (DILAUDID ) injection, menthol , ondansetron  **OR** ondansetron  (ZOFRAN ) IV, oxyCODONE , polyethylene glycol, senna, simethicone   Allergies[1]  Principal Problem:   Pelvic abscess in female Active Problems:   Acute relapsing multiple sclerosis (HCC)   Adynamic ileus (HCC)   Abnormal LFTs   Subjective   Pt feels perfectly fine, no complaints  Objective   Vitals:   05/29/24 0100 05/29/24 0427 05/29/24 0814 05/29/24 1145  BP:  114/62 121/69 118/67  Pulse:  62 81 77  Resp:  18 16 16   Temp: 98.6 F (37 C) 98.5 F (36.9 C) 99.6 F (37.6 C) 98.5 F (36.9 C)   TempSrc: Oral Oral Oral Oral  SpO2:  94% 95% 100%  Weight:  113.4 kg     Vitals:   05/27/24 1201 05/27/24 1621 05/27/24 1946 05/27/24 2325  BP: (!) 114/51 (!) 118/55 125/71 110/61   05/28/24 0801 05/28/24 1212 05/28/24 1540 05/28/24 2000  BP: 120/64 130/69 (!) 146/87 (!) 146/78   05/28/24 2303 05/29/24 0427 05/29/24 0814 05/29/24 1145  BP: 121/64 114/62 121/69 118/67     Subjective Objective: Vital signs (most recent): Blood pressure 118/67, pulse 77, temperature 98.5 F (36.9 C), temperature source Oral, resp. rate 16, weight 113.4 kg, last menstrual period 04/23/2024, SpO2 100%.   Gen  WDWN NAD Abdomen  soft benign Incision  na     Latest Ref Rng & Units 05/29/2024    4:20 AM 05/28/2024    4:41 AM 05/27/2024    4:42 AM  CBC  WBC 4.0 - 10.5 K/uL 10.2  14.1  16.0   Hemoglobin 12.0 - 15.0 g/dL 7.3  7.5  7.5   Hematocrit 36.0 - 46.0 % 23.9  23.8  23.5   Platelets 150 - 400 K/uL 618  534  498        Latest Ref Rng & Units 05/29/2024  4:20 AM 05/28/2024    4:41 AM 05/27/2024    4:42 AM  CMP  Glucose 70 - 99 mg/dL 94  877  867   BUN 6 - 20 mg/dL 5  8  7    Creatinine 0.44 - 1.00 mg/dL 9.03  8.87  8.83   Sodium 135 - 145 mmol/L 137  135  135   Potassium 3.5 - 5.1 mmol/L 3.9  3.7  3.7   Chloride 98 - 111 mmol/L 102  100  100   CO2 22 - 32 mmol/L 26  28  27    Calcium  8.9 - 10.3 mg/dL 8.4  8.1  7.8   Total Protein 6.5 - 8.1 g/dL 5.8  5.6  5.5   Total Bilirubin 0.0 - 1.2 mg/dL 1.6  1.2  0.9   Alkaline Phos 38 - 126 U/L 164  133  109   AST 15 - 41 U/L 74  49  80   ALT 0 - 44 U/L 104  100  143      Assessment & Plan  POD#14 TVH RS with probable cuff hematoma that became cuff abscess S/P IR drain placement on Van/zosy With continued fevers +BC bacteroides fragilis Wound culture E coli  I reached back out to IR today and they recommend repeat imaging which is scheduled for today, may require redirecting drain   Vonn VEAR Inch, MD 05/29/2024, 3:10 PM         [1] No Known Allergies

## 2024-05-30 ENCOUNTER — Encounter (HOSPITAL_COMMUNITY): Payer: Self-pay | Admitting: Family Medicine

## 2024-05-30 DIAGNOSIS — I82409 Acute embolism and thrombosis of unspecified deep veins of unspecified lower extremity: Secondary | ICD-10-CM | POA: Clinically undetermined

## 2024-05-30 DIAGNOSIS — R7881 Bacteremia: Secondary | ICD-10-CM | POA: Insufficient documentation

## 2024-05-30 DIAGNOSIS — N179 Acute kidney failure, unspecified: Secondary | ICD-10-CM | POA: Diagnosis not present

## 2024-05-30 DIAGNOSIS — R748 Abnormal levels of other serum enzymes: Secondary | ICD-10-CM | POA: Diagnosis not present

## 2024-05-30 LAB — CBC
HCT: 22.7 % — ABNORMAL LOW (ref 36.0–46.0)
Hemoglobin: 7.1 g/dL — ABNORMAL LOW (ref 12.0–15.0)
MCH: 29.5 pg (ref 26.0–34.0)
MCHC: 31.3 g/dL (ref 30.0–36.0)
MCV: 94.2 fL (ref 80.0–100.0)
Platelets: 610 K/uL — ABNORMAL HIGH (ref 150–400)
RBC: 2.41 MIL/uL — ABNORMAL LOW (ref 3.87–5.11)
RDW: 13.3 % (ref 11.5–15.5)
WBC: 11.5 K/uL — ABNORMAL HIGH (ref 4.0–10.5)
nRBC: 0 % (ref 0.0–0.2)

## 2024-05-30 LAB — VITAMIN B12: Vitamin B-12: 536 pg/mL (ref 180–914)

## 2024-05-30 LAB — COMPREHENSIVE METABOLIC PANEL WITH GFR
ALT: 86 U/L — ABNORMAL HIGH (ref 0–44)
AST: 50 U/L — ABNORMAL HIGH (ref 15–41)
Albumin: 2.2 g/dL — ABNORMAL LOW (ref 3.5–5.0)
Alkaline Phosphatase: 172 U/L — ABNORMAL HIGH (ref 38–126)
Anion gap: 10 (ref 5–15)
BUN: 5 mg/dL — ABNORMAL LOW (ref 6–20)
CO2: 27 mmol/L (ref 22–32)
Calcium: 8.5 mg/dL — ABNORMAL LOW (ref 8.9–10.3)
Chloride: 100 mmol/L (ref 98–111)
Creatinine, Ser: 1.01 mg/dL — ABNORMAL HIGH (ref 0.44–1.00)
GFR, Estimated: >60 mL/min (ref >60)
Glucose, Bld: 111 mg/dL — ABNORMAL HIGH (ref 70–99)
Potassium: 3.8 mmol/L (ref 3.5–5.1)
Sodium: 137 mmol/L (ref 135–145)
Total Bilirubin: 1.2 mg/dL (ref 0.0–1.2)
Total Protein: 5.7 g/dL — ABNORMAL LOW (ref 6.5–8.1)

## 2024-05-30 LAB — PREPARE RBC (CROSSMATCH)

## 2024-05-30 LAB — IRON AND TIBC
Iron: 15 ug/dL — ABNORMAL LOW (ref 28–170)
Saturation Ratios: 5 % — ABNORMAL LOW (ref 10.4–31.8)
TIBC: 290 ug/dL (ref 250–450)
UIBC: 275 ug/dL

## 2024-05-30 LAB — FOLATE: Folate: 4.3 ng/mL — ABNORMAL LOW (ref >5.9)

## 2024-05-30 LAB — PROTIME-INR
INR: 1.1 (ref 0.8–1.2)
Prothrombin Time: 14.6 s (ref 11.4–15.2)

## 2024-05-30 LAB — APTT: aPTT: 41 s — ABNORMAL HIGH (ref 24–36)

## 2024-05-30 MED ORDER — SODIUM CHLORIDE 0.9% IV SOLUTION: Freq: Once | INTRAVENOUS | Status: AC

## 2024-05-30 MED ORDER — HEPARIN (PORCINE) 25000 UT/250ML-% IV SOLN
2400.0000 [IU]/h | INTRAVENOUS | Status: DC
  Administered 2024-05-30 – 2024-05-31 (×2): 2000 [IU]/h via INTRAVENOUS
  Filled 2024-05-30 (×2): qty 250

## 2024-05-30 MED ORDER — POLYETHYLENE GLYCOL 3350 17 G PO PACK
17.0000 g | PACK | ORAL | Status: DC
  Administered 2024-05-31 – 2024-06-01 (×2): 17 g via ORAL
  Filled 2024-05-30 (×3): qty 1

## 2024-05-30 MED ORDER — PANTOPRAZOLE SODIUM 40 MG PO TBEC
40.0000 mg | DELAYED_RELEASE_TABLET | Freq: Every day | ORAL | Status: DC
  Administered 2024-05-31 – 2024-06-02 (×3): 40 mg via ORAL
  Filled 2024-05-30 (×3): qty 1

## 2024-05-30 MED ORDER — VITAMIN D (ERGOCALCIFEROL) 1.25 MG (50000 UNIT) PO CAPS
50000.0000 [IU] | ORAL_CAPSULE | ORAL | Status: DC
  Administered 2024-05-30: 17:00:00 50000 [IU] via ORAL
  Filled 2024-05-30: qty 1

## 2024-05-30 NOTE — Assessment & Plan Note (Signed)
 Transaminitis noted, LFTs are improving, continue to monitor Hepatitis panel negative Possibly reactive to current infection    Latest Ref Rng & Units 05/30/2024    5:28 AM 05/29/2024    4:20 AM 05/28/2024    4:41 AM  Hepatic Function  Total Protein 6.5 - 8.1 g/dL 5.7  5.8  5.6   Albumin  3.5 - 5.0 g/dL 2.2  2.3  2.4   AST 15 - 41 U/L 50  74  49   ALT 0 - 44 U/L 86  104  100   Alk Phosphatase 38 - 126 U/L 172  164  133   Total Bilirubin 0.0 - 1.2 mg/dL 1.2  1.6  1.2    - Continue monitoring closely, avoiding hepatotoxins - Brief gentle IV hydration

## 2024-05-30 NOTE — Progress Notes (Signed)
 Referring Physician(s): Dr. Vina Solian   Supervising Physician: Philip Cornet  Patient Status:  [facility] - In-pt  Chief Complaint: Pelvic abscess s/p right transgluteal drain placement in IR 05/26/24  Subjective: Patient resting in bed. No apparent discomfort or distress observed. Dr. Izell was also in the room rounding on patient.   Allergies: Patient has no known allergies.  Medications: Prior to Admission medications  Medication Sig Start Date End Date Taking? Authorizing Provider  cyanocobalamin  (VITAMIN B12) 1000 MCG/ML injection Inject 1,000 mcg into the skin every 30 (thirty) days.   Yes [provider]  cyclobenzaprine  (FLEXERIL ) 10 MG tablet Take 5-10 mg by mouth 3 (three) times daily as needed for muscle spasms.   Yes [provider]  famotidine  (PEPCID ) 20 MG tablet Take 1 tablet (20 mg total) by mouth 2 (two) times daily. Patient taking differently: Take 20 mg by mouth daily as needed for heartburn or indigestion. 01/12/24  Yes Geiple, Joshua, PA-C  ocrelizumab (OCREVUS) 300 MG/10ML injection Inject 300 mg into the vein every 6 (six) months.   Yes [provider]  ondansetron  (ZOFRAN -ODT) 4 MG disintegrating tablet Take 1 tablet (4 mg total) by mouth every 6 (six) hours as needed for nausea. 05/16/24  Yes Constant, Peggy, MD  pantoprazole  (PROTONIX ) 40 MG tablet Take 40 mg by mouth daily as needed (acid reflux).   Yes [provider]  Vitamin D , Ergocalciferol , (DRISDOL ) 1.25 MG (50000 UNIT) CAPS capsule Take 50,000 Units by mouth once a week.   Yes [provider]  ferrous sulfate  325 (65 FE) MG tablet Take 1 tablet (325 mg total) by mouth every other day. 05/29/24   Erik Kieth BROCKS, MD  oxyCODONE  (OXY IR/ROXICODONE ) 5 MG immediate release tablet Take 1 tablet (5 mg total) by mouth every 4 (four) hours as needed for breakthrough pain. 05/28/24   Erik Kieth BROCKS, MD  sodium chloride  flush 0.9 % SOLN injection Please flush  catheter daily with 5-10 mL of saline. 05/28/24 07/28/24  Carim, Carlin LABOR, PA-C     Vital Signs: BP 127/73 (BP Location: Left Arm)   Pulse 78   Temp 98.5 F (36.9 C) (Oral)   Resp 16   Wt 250 lb 9.6 oz (113.7 kg)   LMP 04/23/2024 (Approximate)   SpO2 99%   BMI 43.02 kg/m   Physical Exam Constitutional:      General: She is not in acute distress.    Appearance: She is not ill-appearing.  Cardiovascular:     Rate and Rhythm: Normal rate.  Pulmonary:     Effort: Pulmonary effort is normal.  Abdominal:     Comments: Right TG drain to suction. Approximately 5 ml of serosanguineous fluid in bulb. Drain easily flushed. Dressing clean/intact. Skin insertion site unremarkable.   Skin:    General: Skin is warm and dry.  Neurological:     Mental Status: She is alert and oriented to person, place, and time.     Imaging:   Labs:  CBC: Recent Labs    05/27/24 0442 05/28/24 0441 05/29/24 0420 05/30/24 0528  WBC 16.0* 14.1* 10.2 11.5*  HGB 7.5* 7.5* 7.3* 7.1*  HCT 23.5* 23.8* 23.9* 22.7*  PLT 498* 534* 618* 610*    COAGS: No results for input(s): INR, APTT in the last 8760 hours.  BMP: Recent Labs    05/27/24 0442 05/28/24 0441 05/29/24 0420 05/30/24 0528  NA 135 135 137 137  K 3.7 3.7 3.9 3.8  CL 100 100 102  100  CO2 27 28 26 27   GLUCOSE 132* 122* 94 111*  BUN 7 8 <5* 5*  CALCIUM  7.8* 8.1* 8.4* 8.5*  CREATININE 1.16* 1.12* 0.96 1.01*  GFRNONAA >60 >60 >60 >60    LIVER FUNCTION TESTS: Recent Labs    05/27/24 0442 05/28/24 0441 05/29/24 0420 05/30/24 0528  BILITOT 0.9 1.2 1.6* 1.2  AST 80* 49* 74* 50*  ALT 143* 100* 104* 86*  ALKPHOS 109 133* 164* 172*  PROT 5.5* 5.6* 5.8* 5.7*  ALBUMIN  2.6* 2.4* 2.3* 2.2*    Assessment and Plan:   Drain Location: right transgluteal  Size: Fr size: 12 Fr Date of placement: 05/26/24  Currently to: Drain collection device: suction bulb 24 hour output:  Output by Drain (mL) 05/28/24 0700 - 05/28/24 1459  05/28/24 1500 - 05/28/24 2259 05/28/24 2300 - 05/29/24 0659 05/29/24 0700 - 05/29/24 1459 05/29/24 1500 - 05/29/24 2259 05/29/24 2300 - 05/30/24 0659 05/30/24 0700 - 05/30/24 1352  Closed System Drain 1 Inferior;Right;Medial Back Bulb (JP) 12 Fr. 10 5  3 10       Interval imaging/drain manipulation:  CT abdomen/pelvis 05/29/24  IMPRESSION: 1. Interval placement of a percutaneous drain in the right pelvis with decompression of a fluid pocket containing residual central high density. 2. Adjacent 10.0 cm fluid collection in the surgical bed, likely communicating with the drain, seroma/hematoma versus abscess. Follow-up is recommended. 3. 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  Current examination: Flushes/aspirates easily.  Insertion site unremarkable. Suture and stat lock in place. Dressed appropriately.   Plan: Continue TID flushes with 5 cc NS. Record output Q shift. Dressing changes QD or PRN if soiled.   ** Patient scheduled for 05/31/24 for drain injection with possible exchange/reposition. Procedure will be performed under moderate sedation. Procedure was discussed with the patient at the bedside. Consent in IR.   Risks and benefits discussed with the patient including bleeding, infection, damage to adjacent structures, bowel perforation/fistula connection, and sepsis.  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 If heparin  infusion is started this may need to be held several hours prior to drain evaluation in IR.   Electronically Signed: Warren Dais, AGACNP-BC 05/30/2024, 1:51 PM   I spent a total of 15 Minutes at the the patient's bedside AND on the patient's hospital floor or unit, greater than 50% of which was counseling/coordinating care for pelvic abscess.

## 2024-05-30 NOTE — Assessment & Plan Note (Signed)
 Left gonadal vein thrombosis -Incidental finding on a CT scan of pelvis and evaluation of current abscess -Recommending initiating heparin  drip with no bolus as patient's hemoglobin is low around 7.1 -No signs of active bleeding

## 2024-05-30 NOTE — Consult Note (Signed)
 History and Physical   Patient: [name] Staff                            PCP: Shelda Atlas, MD                    DOB: July 15, 1985            DOA: 05/25/2024 FMW:[id number]             DOS: 05/30/2024, 3:30 PM  Shelda Atlas, MD  Patient coming from:   HOME  I have personally reviewed patient's medical records, in electronic medical records, including:  [facility], and care everywhere.    Chief Complaint:   Chief Complaint  Patient presents with   Post-op Problem   Vaginal Bleeding    History of present illness:    [name]  is a 38 year old female with extensive history of multiple sclerosis, GERD, anxiety, B12 deficiency, menorrhagia, dysmenorrhea, pernicious anemia, pregnancy-induced hypertension, spontaneous vaginal delivery x 3, status post hysterectomy.  05/15/24- Status post  Transvaginal Hysterectomy, with right Salpingectomy 05/25/24 - postoperatively, postop day #10 developed intrapelvic abscess, hematoma.   05/25/24 Patient was readmitted to the hospital, IR was consulted, drain was placed, patient was started on broad-spectrum antibiotics.   Subsequently abscess drain grew beta-lactamase positive Bacteroides fragilis,  E. Coli, blood culture also grew bacteria with fragilis-resistant to ampicillin  otherwise pansensitive. Patient has been on IV antibiotics of Zosyn  and vancomycin .    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    Patient is noted for progressive anemia, transaminitis, improved leukocytosis.  [facility] hospitalist consulted for medical management, and treatment plan Regarding anticoagulation and treatment of gonadal vein thrombosis, ongoing infection.     Patient Denies having: Fever, Chills, Cough,  SOB, Chest Pain, Abd pain, N/V/D, headache, dizziness, lightheadedness,  Dysuria, Joint pain, rash, open wounds     Review of Systems: As per HPI, otherwise 10 point review of systems were negative.   ----------------------------------------------------------------------------------------------------------------------  Allergies[1]  Home MEDs:  Prior to Admission medications  Medication Sig Start Date End Date Taking? Authorizing Provider  cyanocobalamin  (VITAMIN B12) 1000 MCG/ML injection Inject 1,000 mcg into the skin every 30 (thirty) days.   Yes [provider]  cyclobenzaprine  (FLEXERIL ) 10 MG tablet Take 5-10 mg by mouth 3 (three) times daily as needed for muscle spasms.   Yes [provider]  famotidine  (PEPCID ) 20 MG tablet Take 1 tablet (20 mg total) by mouth 2 (two) times daily. Patient taking differently: Take 20 mg by mouth daily as needed for heartburn or indigestion. 01/12/24  Yes Geiple, Joshua, PA-C  ocrelizumab (OCREVUS) 300 MG/10ML injection Inject 300 mg into the vein every 6 (six) months.   Yes [provider]  ondansetron  (ZOFRAN -ODT) 4 MG disintegrating tablet Take 1 tablet (4 mg total) by mouth every 6 (six) hours as needed for nausea. 05/16/24  Yes Constant, Peggy, MD  pantoprazole  (PROTONIX ) 40 MG tablet Take 40 mg by mouth daily as needed (acid reflux).   Yes [provider]  Vitamin D , Ergocalciferol , (DRISDOL ) 1.25 MG (50000 UNIT) CAPS capsule Take 50,000 Units by mouth once a week.   Yes [provider]  ferrous sulfate  325 (65 FE) MG tablet Take 1  tablet (325 mg total) by mouth every other day. 05/29/24   Erik Kieth BROCKS, MD  oxyCODONE  (OXY IR/ROXICODONE ) 5 MG immediate release tablet Take 1 tablet (5 mg total) by mouth every 4 (four) hours as needed for breakthrough pain. 05/28/24   Erik Kieth BROCKS, MD  sodium chloride  flush 0.9 % SOLN injection Please flush catheter daily with 5-10 mL of saline. 05/28/24  07/28/24  Carim, Charles A, PA-C    PRN MEDs: acetaminophen , alum & mag hydroxide-simeth, cyclobenzaprine , guaiFENesin , HYDROmorphone  (DILAUDID ) injection, menthol , ondansetron  **OR** ondansetron  (ZOFRAN ) IV, oxyCODONE , senna, simethicone   Past Medical History:  Diagnosis Date   Abnormal Pap smear    Colpo>normal   Anxiety    B12 deficiency 06/27/2013   EGD and Duodenal bx done in 3/15 at [facility] not showing celiac or pernicious anemia     Child behavior problem 03/27/2016   [facility]) noted the patient is having difficulty managing her twin daughters behavior 03/27/2016     Cholestasis during pregnancy in third trimester 07/12/2020   07/11/20 Bile acids 13.8     Fibroid    GERD (gastroesophageal reflux disease)    Gonorrhea    Headache    last migraine 01/2017   History of IBS    watch diet, no meds   History of reversal of tubal ligation 12/20/2019   Done in [place]  [facility] - June 2020     Infection    UTI   Multiple sclerosis    Neuromuscular disorder (HCC)    diagnosed with MS this visit   Obesity    Ovarian cyst    Pernicious anemia 05/02/2013   Pregnancy induced hypertension    Preterm labor    all 3 deliveries   SVD (spontaneous vaginal delivery)    x 3   Urinary tract infection    Vaginal Pap smear, abnormal     Past Surgical History:  Procedure Laterality Date   CHOLECYSTECTOMY N/A 11/25/2017   Procedure: LAPAROSCOPIC CHOLECYSTECTOMY WITH INTRAOPERATIVE CHOLANGIOGRAM;  Surgeon: Eletha Boas, MD;  Location: [facility] ORS;  Service: General;  Laterality: N/A;   COLPOSCOPY     HYSTERECTOMY, VAGINAL, WITH SALPINGECTOMY  05/15/2024   Procedure: HYSTERECTOMY, VAGINAL, WITH RIGHT SALPINGECTOMY;  Surgeon: [name], MD;  Location: [facility] OR;  Service: Gynecology;;  [facility] BS   MYOMECTOMY N/A 04/01/2017   Procedure: Vaginal Myomectomy;  Surgeon: Herchel Gloris LABOR, MD;  Location: [facility] ORS;  Service: Gynecology;  Laterality: N/A;   TUBAL LIGATION     2012/  reversal June 2020   WISDOM TOOTH EXTRACTION       reports that she has never smoked. She has never used smokeless tobacco. She reports that she does not drink alcohol and does not use drugs.   Family History  Problem Relation Age of Onset   Diabetes Mother    Hypertension Mother    Diabetes Father    Hypertension Father    Kidney disease Father    Stroke Father    Learning disabilities Brother    Anesthesia problems Neg Hx    Other Neg Hx     Physical Exam:   Vitals:   05/30/24 0005 05/30/24 0413 05/30/24 0738 05/30/24 1155  BP: (!) 122/58 116/64 132/78 127/73  Pulse: 85 76 79 78  Resp: 17 16 16 16   Temp: 98.5 F (36.9 C) 98.8 F (37.1 C) 99.5 F (37.5 C) 98.5 F (36.9 C)  TempSrc: Oral Oral Oral Oral  SpO2: 100% 94% 97% 99%  Weight:  113.7 kg     Constitutional: NAD, calm, comfortable Eyes: PERRL, lids and conjunctivae normal ENMT: Mucous membranes are moist. Posterior pharynx clear of any exudate or lesions.Normal dentition.  Neck: normal, supple, no masses, no thyromegaly Respiratory: clear to auscultation bilaterally, no wheezing, no crackles. Normal respiratory effort. No accessory muscle use.  Cardiovascular: Regular rate and rhythm, no murmurs / rubs / gallops. No extremity edema. 2+ pedal pulses. No carotid bruits.  Abdomen: no tenderness, no masses palpated. No hepatosplenomegaly. Bowel sounds positive.  Musculoskeletal: no clubbing / cyanosis. No joint deformity upper and lower extremities. Good ROM, no contractures. Normal muscle tone.  Neurologic: CN II-XII grossly intact. Sensation intact, DTR normal. Strength 5/5 in all 4.  Psychiatric: Normal judgment and insight. Alert and oriented x 3. Normal mood.  Skin: no rashes, lesions, ulcers. No induration Pelvic JP drain in place, draining serosanguineous fluid       Labs on admission:    I have personally reviewed following labs and imaging studies  CBC: Recent Labs  Lab 05/26/24 0439  05/27/24 0442 05/28/24 0441 05/29/24 0420 05/30/24 0528  WBC 19.8* 16.0* 14.1* 10.2 11.5*  NEUTROABS 17.0*  --   --   --   --   HGB 8.2* 7.5* 7.5* 7.3* 7.1*  HCT 25.5* 23.5* 23.8* 23.9* 22.7*  MCV 93.4 92.9 93.0 93.0 94.2  PLT 535* 498* 534* 618* 610*   Basic Metabolic Panel: Recent Labs  Lab 05/26/24 0439 05/27/24 0442 05/28/24 0441 05/29/24 0420 05/30/24 0528  NA 133* 135 135 137 137  K 3.9 3.7 3.7 3.9 3.8  CL 102 100 100 102 100  CO2 23 27 28 26 27   GLUCOSE 109* 132* 122* 94 111*  BUN 8 7 8  <5* 5*  CREATININE 1.09* 1.16* 1.12* 0.96 1.01*  CALCIUM  8.2* 7.8* 8.1* 8.4* 8.5*   GFR: Estimated Creatinine Clearance: 91.5 mL/min (A) (by C-G formula based on SCr of 1.01 mg/dL (H)). Liver Function Tests: Recent Labs  Lab 05/26/24 0439 05/27/24 0442 05/28/24 0441 05/29/24 0420 05/30/24 0528  AST 142* 80* 49* 74* 50*  ALT 159* 143* 100* 104* 86*  ALKPHOS 73 109 133* 164* 172*  BILITOT 1.4* 0.9 1.2 1.6* 1.2  PROT 5.6* 5.5* 5.6* 5.8* 5.7*  ALBUMIN  2.8* 2.6* 2.4* 2.3* 2.2*   Recent Labs  Lab 05/25/24 1425  LIPASE 32    Urine analysis:    Component Value Date/Time   COLORURINE YELLOW 05/25/2024 1421   APPEARANCEUR CLEAR 05/25/2024 1421   LABSPEC 1.023 05/25/2024 1421   PHURINE 5.0 05/25/2024 1421   GLUCOSEU NEGATIVE 05/25/2024 1421   HGBUR SMALL (A) 05/25/2024 1421   HGBUR trace-intact 02/24/2010 0956   BILIRUBINUR NEGATIVE 05/25/2024 1421   BILIRUBINUR negative 05/09/2019 0830   KETONESUR NEGATIVE 05/25/2024 1421   PROTEINUR 30 (A) 05/25/2024 1421   UROBILINOGEN 0.2 05/09/2019 0830   UROBILINOGEN 0.2 03/11/2015 1105   NITRITE NEGATIVE 05/25/2024 1421   LEUKOCYTESUR NEGATIVE 05/25/2024 1421    Last A1C:  No results found for: HGBA1C   Radiologic Exams on Admission:   CT ABDOMEN PELVIS W CONTRAST Result Date: 05/29/2024 EXAM: CT ABDOMEN AND PELVIS WITH CONTRAST 05/29/2024 07:57:00 PM TECHNIQUE: CT of the abdomen and pelvis was performed with the  administration of 75 mL iohexol  (OMNIPAQUE ) 350 MG/ML injection. Multiplanar reformatted images are provided for review. Automated exposure control, iterative reconstruction, and/or weight-based adjustment of the mA/kV was utilized to reduce the radiation dose to as low as reasonably achievable. COMPARISON: 05/25/2024 CLINICAL HISTORY: patient  febrile s/p drain placement FINDINGS: LOWER CHEST: Mild bibasilar atelectasis. LIVER: The liver is unremarkable. GALLBLADDER AND BILE DUCTS: Status post cholecystectomy. No biliary ductal dilatation. SPLEEN: No acute abnormality. PANCREAS: No acute abnormality. ADRENAL GLANDS: No acute abnormality. KIDNEYS, URETERS AND BLADDER: No stones in the kidneys or ureters. No hydronephrosis. No perinephric or periureteral stranding. Urinary bladder is unremarkable. GI AND BOWEL: Stomach demonstrates no acute abnormality. There is no bowel obstruction. Normal appendix (image 66). PERITONEUM AND RETROPERITONEUM: No ascites. No free air. Interval placement of a percutaneous drain in the right pelvis, with residual central high density within an otherwise decompressed fluid pocket (image 87), which likely communicates with the larger collection. Lobulated 10.0 x 3.7 x 6.0 cm fluid collection in the surgical bed (sagittal image 78). VASCULATURE: Aorta is normal in caliber. 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 LYMPH NODES: No lymphadenopathy. REPRODUCTIVE ORGANS: Status post hysterectomy. Progressive enlargement/cystic changes of the left ovary (image 77). BONES AND SOFT TISSUES: No acute osseous abnormality. Right abdominal wall lipoma. IMPRESSION: 1. Interval placement of a percutaneous drain in the right pelvis with decompression of a fluid pocket containing residual central high density. 2. Adjacent 10.0 cm fluid collection in the surgical bed, likely communicating with the drain, seroma/hematoma versus abscess. Follow-up is recommended. 3. 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 Electronically signed by: Pinkie Pebbles MD 05/29/2024 09:58 PM EST RP Workstation: [id number]    EKG:   Independently reviewed.  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 by IR  Pernicious anemia Obtaining iron  studies, B12 and folate -Acute on chronic pernicious anemia    Latest Ref Rng & Units 05/30/2024    5:28 AM 05/29/2024    4:20 AM 05/28/2024    4:41 AM  CBC  WBC 4.0 - 10.5 K/uL 11.5  10.2  14.1   Hemoglobin 12.0 - 15.0 g/dL 7.1  7.3  7.5   Hematocrit 36.0 - 46.0 % 22.7  23.9  23.8   Platelets 150 - 400 K/uL 610  618  534    - No signs of active bleeding, possible intra pelvic abscess versus hematoma -Will continue to monitor H&H closely -Before initiating heparin  drip for left gonadal thrombosis, Recommended to you PRBC transfusion.  Pros  and cons of blood transfer discussed with the patient detail she is agreed to pursue  AKI (acute kidney injury) - Mildly elevated BUN/creatinine from baseline -Recommend gentle IV fluid hydration next 24 hours  Adynamic ileus (HCC) Improving, continue bowel regimen,  Acute relapsing multiple sclerosis (HCC) No flareup at this time Per patient under treatment with IVIG every 6 months -Close follow-up with primary oncologist recommended -Discussed with Dr. Link of close follow-up with her primary oncologist Commended especially regarding her anemia and left gonadal vein thrombosis treatment  Bacteremia 05/25/2024 blood cultures growing bacteria with fragilis-pansensitive, cultures from  05/28/2024-Repeat cultures -no growth to date -Currently patient on vancomycin /Rocephin  -Recommending discontinuing vancomycin  and Zosyn , -for monotherapy possibly ciprofloxacin   Elevated alkaline phosphatase level Likely elevated in the face of diffuse transaminitis -Possibly due to infection, hepatitis panel negative -Will initiate IV fluid hydration, will monitor closely  Abnormal LFTs Transaminitis noted, LFTs are improving, continue to monitor Hepatitis panel negative Possibly reactive to current infection    Latest Ref Rng & Units 05/30/2024    5:28 AM 05/29/2024    4:20 AM 05/28/2024    4:41 AM  Hepatic Function  Total Protein 6.5 - 8.1 g/dL 5.7  5.8  5.6   Albumin  3.5 - 5.0 g/dL 2.2  2.3  2.4   AST 15 - 41 U/L 50  74  49   ALT 0 - 44 U/L 86  104  100   Alk Phosphatase 38 - 126 U/L 172  164  133   Total Bilirubin 0.0 - 1.2 mg/dL 1.2  1.6  1.2    - Continue monitoring closely, avoiding hepatotoxins - Brief gentle IV hydration     Consults called: [facility] hospitalist/oncologist/IR -------------------------------------------------------------------------------------------------------------------------------------------- DVT prophylaxis:  SCDs Start: 05/25/24 2221   Code  Status:   Code Status: Full Code   Admission status: Patient will be admitted as Inpatient, with a greater than 2 midnight length of stay. Level of care: Med-Surg   Family Communication:  none at bedside  (The above findings and plan of care has been discussed with patient in detail, the patient expressed understanding and agreement of above plan)  --------------------------------------------------------------------------------------------------------------------------------------------------  Disposition Plan:  Anticipated 1-2 days Status is: Inpatient Remains inpatient appropriate because: Needing IV antibiotics, IR intervention  ----------------------------------------------------------------------------------------------------------------------------------------  Time spent:  14  Min.  Was spent seeing and evaluating the patient, reviewing all medical records, drawn plan of care.  SIGNED: Adriana DELENA Grams, MD, FHM. FAAFP. [facility], Pager  (Please use [email or web address] to page/ or secure chat through epic) If 7PM-7AM, please contact night-coverage [email or web address],  05/30/2024, 3:30 PM     [1] No Known Allergies

## 2024-05-30 NOTE — Assessment & Plan Note (Signed)
 Obtaining iron  studies, B12 and folate -Acute on chronic pernicious anemia    Latest Ref Rng & Units 05/30/2024    5:28 AM 05/29/2024    4:20 AM 05/28/2024    4:41 AM  CBC  WBC 4.0 - 10.5 K/uL 11.5  10.2  14.1   Hemoglobin 12.0 - 15.0 g/dL 7.1  7.3  7.5   Hematocrit 36.0 - 46.0 % 22.7  23.9  23.8   Platelets 150 - 400 K/uL 610  618  534    - No signs of active bleeding, possible intra pelvic abscess versus hematoma -Will continue to monitor H&H closely -Before initiating heparin  drip for left gonadal thrombosis, Recommended to you PRBC transfusion.  Pros and cons of blood transfer discussed with the patient detail she is agreed to pursue

## 2024-05-30 NOTE — Assessment & Plan Note (Signed)
 Postop pelvic abscess, drain in place, draining serosanguineous fluid -CT pelvis reviewed continues to reveal 10 cm fluid collection, abscess versus hematoma -Redirecting drain in a.m. by IR

## 2024-05-30 NOTE — Assessment & Plan Note (Addendum)
 Improving, continue bowel regimen,

## 2024-05-30 NOTE — Assessment & Plan Note (Signed)
 05/25/2024 blood cultures growing bacteria with fragilis-pansensitive, cultures from  05/28/2024-Repeat cultures -no growth to date -Currently patient on vancomycin /Rocephin  -Recommending discontinuing vancomycin  and Zosyn , -for monotherapy possibly ciprofloxacin 

## 2024-05-30 NOTE — Assessment & Plan Note (Signed)
 Likely elevated in the face of diffuse transaminitis -Possibly due to infection, hepatitis panel negative -Will initiate IV fluid hydration, will monitor closely

## 2024-05-30 NOTE — Hospital Course (Addendum)
 [name]  is a 37 year old female with extensive history of multiple sclerosis, GERD, anxiety, B12 deficiency, menorrhagia, dysmenorrhea, pernicious anemia, pregnancy-induced hypertension, spontaneous vaginal delivery x 3, status post hysterectomy.  05/15/24- Status post  Transvaginal Hysterectomy, with right Salpingectomy 05/25/24 - postoperatively, postop day #10 developed intrapelvic abscess, hematoma.   05/25/24 Patient was readmitted to the hospital, IR was consulted, drain was placed, patient was started on broad-spectrum antibiotics.   Subsequently abscess drain grew beta-lactamase positive Bacteroides fragilis,  E. Coli, blood culture also grew bacteria with fragilis-resistant to ampicillin  otherwise pansensitive. Patient has been on IV antibiotics of Zosyn  and vancomycin .    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    Patient is noted for progressive anemia, transaminitis, improved leukocytosis.  [facility] hospitalist consulted for medical management, and treatment plan Regarding anticoagulation and treatment of gonadal vein thrombosis, ongoing infection.

## 2024-05-30 NOTE — Progress Notes (Signed)
 Subjective: Patient reports nausea last night and pain that accompanied flushing her drain. CT was done. Some vaginal drainage noted  Objective: I have reviewed patient's vital signs, medications, labs, microbiology, and radiology results. Blood pressure 116/64, pulse 76, temperature 98.8 F (37.1 C), temperature source Oral, resp. rate 16, weight 113.7 kg, last menstrual period 04/23/2024, SpO2 94%.  General: alert, cooperative, and no distress Resp: normal effort Cardio: regular rate and rhythm GI: soft, non-tender; bowel sounds normal; no masses,  no organomegaly  Narrative & Impression  EXAM: CT ABDOMEN AND PELVIS WITH CONTRAST 05/29/2024 07:57:00 PM   TECHNIQUE: CT of the abdomen and pelvis was performed with the administration of 75 mL iohexol  (OMNIPAQUE ) 350 MG/ML injection. Multiplanar reformatted images are provided for review. Automated exposure control, iterative reconstruction, and/or weight-based adjustment of the mA/kV was utilized to reduce the radiation dose to as low as reasonably achievable.   COMPARISON: 05/25/2024   CLINICAL HISTORY: patient febrile s/p drain placement   FINDINGS:   LOWER CHEST: Mild bibasilar atelectasis.   LIVER: The liver is unremarkable.   GALLBLADDER AND BILE DUCTS: Status post cholecystectomy. No biliary ductal dilatation.   SPLEEN: No acute abnormality.   PANCREAS: No acute abnormality.   ADRENAL GLANDS: No acute abnormality.   KIDNEYS, URETERS AND BLADDER: No stones in the kidneys or ureters. No hydronephrosis. No perinephric or periureteral stranding. Urinary bladder is unremarkable.   GI AND BOWEL: Stomach demonstrates no acute abnormality. There is no bowel obstruction. Normal appendix (image 66).   PERITONEUM AND RETROPERITONEUM: No ascites. No free air. Interval placement of a percutaneous drain in the right pelvis, with residual central high density within an otherwise decompressed fluid pocket (image  87), which likely communicates with the larger collection. Lobulated 10.0 x 3.7 x 6.0 cm fluid collection in the surgical bed (sagittal image 78).   VASCULATURE: Aorta is normal in caliber. 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   LYMPH NODES: No lymphadenopathy.   REPRODUCTIVE ORGANS: Status post hysterectomy. Progressive enlargement/cystic changes of the left ovary (image 77).   BONES AND SOFT TISSUES: No acute osseous abnormality. Right abdominal wall lipoma.   IMPRESSION: 1. Interval placement of a percutaneous drain in the right pelvis with decompression of a fluid pocket containing residual central high density. 2. Adjacent 10.0 cm fluid collection in the surgical bed, likely communicating with the drain, seroma/hematoma versus abscess. Follow-up is recommended. 3. 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   Electronically signed by: Pinkie Pebbles MD 05/29/2024 09:58 PM EST RP Workstation: [id number]  CBC    Component Value Date/Time   WBC 11.5 (H) 05/30/2024 0528   RBC 2.41 (L) 05/30/2024 0528   HGB 7.1 (L) 05/30/2024 0528   HGB 9.4 (L) 06/03/2020 1046   HCT 22.7 (L) 05/30/2024 0528   HCT 29.2 (L) 06/03/2020 1046   PLT 610 (H) 05/30/2024 0528   PLT 371 06/03/2020 1046   MCV 94.2 05/30/2024 0528   MCV 92 06/03/2020 1046   MCH 29.5 05/30/2024 0528   MCHC 31.3 05/30/2024 0528   RDW 13.3 05/30/2024 0528   RDW 12.0 06/03/2020 1046   LYMPHSABS 1.2 05/26/2024 0439   LYMPHSABS 1.4 02/26/2020 1135   MONOABS 1.4 (H) 05/26/2024 0439   EOSABS 0.0 05/26/2024 0439   EOSABS 0.0 02/26/2020 1135   BASOSABS 0.1 05/26/2024 0439   BASOSABS 0.0 02/26/2020 1135    Assessment/Plan: Postoperative cuff hematoma/abscess, fluid collection reassessed by  CT last night  Ask IR to assess Follow CBC, HB with no significant drop in 3 days (7.5-7.1)  LOS: 5 days     Lynwood Solomons, MD 05/30/2024, 7:28 AM

## 2024-05-30 NOTE — Assessment & Plan Note (Signed)
-   Mildly elevated BUN/creatinine from baseline -Recommend gentle IV fluid hydration next 24 hours

## 2024-05-30 NOTE — Care Management Important Message (Signed)
 Important Message  Patient Details  Name: [name] MRN: [id number] Date of Birth: Dec 17, 1985   Important Message Given:  Yes - Medicare IM     [name] 05/30/2024, 8:10 AM

## 2024-05-30 NOTE — Assessment & Plan Note (Signed)
 No flareup at this time Per patient under treatment with IVIG every 6 months -Close follow-up with primary oncologist recommended -Discussed with Dr. Link of close follow-up with her primary oncologist Commended especially regarding her anemia and left gonadal vein thrombosis treatment

## 2024-05-30 NOTE — Progress Notes (Signed)
 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   Medical History: Past Medical History:  Diagnosis Date   Abnormal Pap smear    Colpo>normal   Anxiety    B12 deficiency 06/27/2013   EGD and Duodenal bx done in 3/15 at [facility] not showing celiac or pernicious anemia     Child behavior problem 03/27/2016   [facility]) noted the patient is having difficulty managing her twin daughters behavior 03/27/2016     Cholestasis during pregnancy in third trimester 07/12/2020   07/11/20 Bile acids 13.8     Fibroid    GERD (gastroesophageal reflux disease)    Gonorrhea    Headache    last migraine 01/2017   History of IBS    watch diet, no meds   History of reversal of tubal ligation 12/20/2019   Done in [place]  [facility]     Infection    UTI   Multiple sclerosis    Neuromuscular disorder (HCC)    diagnosed with MS this visit   Obesity    Ovarian cyst    Pernicious anemia 05/02/2013   Pregnancy induced hypertension    Preterm labor    all 3 deliveries   SVD (spontaneous vaginal delivery)    x 3   Urinary tract infection    Vaginal Pap smear, abnormal     Medications:  Scheduled:   sodium chloride    Intravenous Once   sodium chloride    Intravenous Once   ferrous sulfate   325 mg Oral QODAY   gabapentin   100 mg Oral BID   [START ON 05/31/2024] pantoprazole   40 mg Oral Daily   [START ON  05/31/2024] polyethylene glycol  17 g Oral QODAY   sodium chloride  flush  5 mL Intracatheter Q8H   Infusions:   heparin      piperacillin -tazobactam 3.375 g (05/30/24 1559)   vancomycin  1,500 mg (05/29/24 2232)   PRN: acetaminophen , alum & mag hydroxide-simeth, cyclobenzaprine , guaiFENesin , HYDROmorphone  (DILAUDID ) injection, menthol , ondansetron  **OR** ondansetron  (ZOFRAN ) IV, oxyCODONE , senna, simethicone  Anti-infectives (From admission, onward)    Start     Dose/Rate Route Frequency Ordered Stop   05/29/24 2200  vancomycin  (VANCOREADY) IVPB 1500 mg/300 mL        1,500 mg 150 mL/hr over 120 Minutes Intravenous Every 24 hours 05/28/24 2131     05/28/24 2200  vancomycin  (VANCOREADY) IVPB 2000 mg/400 mL        2,000 mg 200 mL/hr over 120 Minutes Intravenous  Once 05/28/24 2125 05/29/24 0101   05/25/24 2300  piperacillin -tazobactam (ZOSYN ) IVPB 3.375 g        3.375 g 12.5 mL/hr over 240 Minutes Intravenous Every 8 hours 05/25/24 2225     05/25/24 2000  vancomycin  (VANCOREADY) IVPB 2000 mg/400 mL        2,000 mg 200 mL/hr over 120 Minutes Intravenous  Once 05/25/24 1946 05/25/24 2325   05/25/24 2000  ceFEPIme  (MAXIPIME ) 2 g in sodium chloride  0.9 % 100 mL IVPB        2 g 200 mL/hr over 30 Minutes Intravenous  Once 05/25/24 1946 05/25/24 2120   05/25/24 1945  metroNIDAZOLE  (FLAGYL ) IVPB 500 mg        500 mg 100 mL/hr over 60 Minutes Intravenous  Once 05/25/24 1942 05/25/24 2151       Assessment: 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 Heparin  infusion at 2000 units/hr with no bolus due to patient's hemoglobin of 7.1. No active bleeding. Patient to scheduled for 12/17 for drain injection with possible exchange/reposition.  Goal of Therapy:  Heparin  level 0.3-0.7 units/ml Monitor platelets by anticoagulation protocol: Yes   Plan:  Start heparin  infusion at 2000 units/hr Check heparin  level in 6 hours from start and daily while  on heparin  Continue to monitor H&H and platelets  Heparin  to be held several hours prior to drain evaluation in IR.   [name] 05/30/2024,4:02 PM      [1] No Known Allergies

## 2024-05-30 NOTE — Progress Notes (Signed)
 GYN Note 05/30/2024 1030am RN told me that patient had questions re: plan of care; pt seen earlier today by Dr. Eveline. I went by and saw her at around 1130am and d/w her that I'll make sure IR has seen the final read of the scan from yesterday and for any possible interventions. She was also wondering if her being on Octrevus, for her MS, could have caused issues. I told her that any monoclonal antibody can potentially cause issues with post op healing; she last received her med 4 weeks before her surgery.   She was wondering about potential IR intervention and I told her that I'll touch base with IR to have the review the scan to see about possible intervention  Bebe Izell Raddle MD Attending Center for [facility] (Faculty Practice) GYN Consult Phone: [phone] (M-F, 0800-1700) & [phone] (Off hours, weekends, holidays)  UPDATE 1350 I spoke to IR and they will come see and evaluate tomorrow (potential readjustment vs new drain). Given this, I spoke to her about CT yesterday showing a non-occlusive left gonadal vein thrombus, which isn't not mentioned in her prior scans. I told her that given this I recommend anti-coagulation and will have the hospitalists come see since she'll need heparin . I also talked to IR about starting anti-coag prior to her IR eval tomorrow.   Bebe Izell Raddle MD Attending Center for [facility] (Faculty Practice) GYN Consult Phone: [phone] (M-F, [phone]) & [phone] (Off hours, weekends, holidays)

## 2024-05-31 ENCOUNTER — Other Ambulatory Visit (HOSPITAL_COMMUNITY): Payer: Self-pay

## 2024-05-31 ENCOUNTER — Telehealth (HOSPITAL_COMMUNITY): Payer: Self-pay

## 2024-05-31 ENCOUNTER — Inpatient Hospital Stay (HOSPITAL_COMMUNITY)

## 2024-05-31 DIAGNOSIS — R7881 Bacteremia: Secondary | ICD-10-CM | POA: Diagnosis not present

## 2024-05-31 DIAGNOSIS — I8290 Acute embolism and thrombosis of unspecified vein: Secondary | ICD-10-CM

## 2024-05-31 DIAGNOSIS — N739 Female pelvic inflammatory disease, unspecified: Secondary | ICD-10-CM | POA: Diagnosis not present

## 2024-05-31 DIAGNOSIS — R7989 Other specified abnormal findings of blood chemistry: Secondary | ICD-10-CM

## 2024-05-31 DIAGNOSIS — N179 Acute kidney failure, unspecified: Secondary | ICD-10-CM | POA: Diagnosis not present

## 2024-05-31 DIAGNOSIS — K560 Paralytic ileus: Secondary | ICD-10-CM | POA: Diagnosis not present

## 2024-05-31 DIAGNOSIS — D510 Vitamin B12 deficiency anemia due to intrinsic factor deficiency: Secondary | ICD-10-CM | POA: Diagnosis not present

## 2024-05-31 DIAGNOSIS — G35A Relapsing-remitting multiple sclerosis: Secondary | ICD-10-CM | POA: Diagnosis not present

## 2024-05-31 HISTORY — PX: IR CATHETER TUBE CHANGE: IMG717

## 2024-05-31 LAB — COMPREHENSIVE METABOLIC PANEL WITH GFR
ALT: 102 U/L — ABNORMAL HIGH (ref 0–44)
AST: 72 U/L — ABNORMAL HIGH (ref 15–41)
Albumin: 3.2 g/dL — ABNORMAL LOW (ref 3.5–5.0)
Alkaline Phosphatase: 263 U/L — ABNORMAL HIGH (ref 38–126)
Anion gap: 9 (ref 5–15)
BUN: 8 mg/dL (ref 6–20)
CO2: 27 mmol/L (ref 22–32)
Calcium: 8.9 mg/dL (ref 8.9–10.3)
Chloride: 101 mmol/L (ref 98–111)
Creatinine, Ser: 0.99 mg/dL (ref 0.44–1.00)
GFR, Estimated: >60 mL/min (ref >60)
Glucose, Bld: 91 mg/dL (ref 70–99)
Potassium: 4.3 mmol/L (ref 3.5–5.1)
Sodium: 137 mmol/L (ref 135–145)
Total Bilirubin: 0.9 mg/dL (ref 0.0–1.2)
Total Protein: 6.1 g/dL — ABNORMAL LOW (ref 6.5–8.1)

## 2024-05-31 LAB — TRANSFUSION REACTION
DAT C3: NEGATIVE
Post RXN DAT IgG: NEGATIVE

## 2024-05-31 LAB — CBC
HCT: 29.9 % — ABNORMAL LOW (ref 36.0–46.0)
Hemoglobin: 9.5 g/dL — ABNORMAL LOW (ref 12.0–15.0)
MCH: 28.9 pg (ref 26.0–34.0)
MCHC: 31.8 g/dL (ref 30.0–36.0)
MCV: 90.9 fL (ref 80.0–100.0)
Platelets: 673 K/uL — ABNORMAL HIGH (ref 150–400)
RBC: 3.29 MIL/uL — ABNORMAL LOW (ref 3.87–5.11)
RDW: 14.6 % (ref 11.5–15.5)
WBC: 13 K/uL — ABNORMAL HIGH (ref 4.0–10.5)
nRBC: 1.2 % — ABNORMAL HIGH (ref 0.0–0.2)

## 2024-05-31 LAB — PROTIME-INR
INR: 1.1 (ref 0.8–1.2)
Prothrombin Time: 14.5 s (ref 11.4–15.2)

## 2024-05-31 LAB — LACTATE DEHYDROGENASE: LDH: 316 U/L — ABNORMAL HIGH (ref 105–235)

## 2024-05-31 LAB — HEPARIN LEVEL (UNFRACTIONATED): Heparin Unfractionated: 0.17 [IU]/mL — ABNORMAL LOW (ref 0.30–0.70)

## 2024-05-31 MED ORDER — IOHEXOL 300 MG/ML  SOLN
50.0000 mL | Freq: Once | INTRAMUSCULAR | Status: AC | PRN
  Administered 2024-05-31: 09:00:00 10 mL

## 2024-05-31 MED ORDER — ZOLPIDEM TARTRATE 5 MG PO TABS
5.0000 mg | ORAL_TABLET | Freq: Every evening | ORAL | Status: DC | PRN
  Administered 2024-05-31 – 2024-06-02 (×3): 5 mg via ORAL
  Filled 2024-05-31 (×3): qty 1

## 2024-05-31 MED ORDER — SODIUM CHLORIDE 0.9 % IV SOLN
3.0000 g | Freq: Four times a day (QID) | INTRAVENOUS | Status: DC
  Administered 2024-05-31 – 2024-06-03 (×11): 3 g via INTRAVENOUS
  Filled 2024-05-31 (×11): qty 8

## 2024-05-31 MED ORDER — LIDOCAINE HCL 1 % IJ SOLN
INTRAMUSCULAR | Status: AC
  Filled 2024-05-31: qty 20

## 2024-05-31 MED ORDER — APIXABAN 5 MG PO TABS: 5.0000 mg | ORAL_TABLET | Freq: Two times a day (BID) | ORAL | Status: DC

## 2024-05-31 MED ORDER — HEPARIN BOLUS VIA INFUSION
3450.0000 [IU] | Freq: Once | INTRAVENOUS | Status: AC
  Administered 2024-05-31: 07:00:00 3450 [IU] via INTRAVENOUS
  Filled 2024-05-31: qty 3450

## 2024-05-31 MED ORDER — FENTANYL CITRATE (PF) 100 MCG/2ML IJ SOLN
INTRAMUSCULAR | Status: AC | PRN
  Administered 2024-05-31 (×3): 50 ug via INTRAVENOUS

## 2024-05-31 MED ORDER — MIDAZOLAM HCL (PF) 2 MG/2ML IJ SOLN
INTRAMUSCULAR | Status: AC | PRN
  Administered 2024-05-31 (×2): 1 mg via INTRAVENOUS

## 2024-05-31 MED ORDER — APIXABAN 5 MG PO TABS
10.0000 mg | ORAL_TABLET | Freq: Two times a day (BID) | ORAL | Status: DC
  Administered 2024-05-31 – 2024-06-02 (×6): 10 mg via ORAL
  Filled 2024-05-31 (×8): qty 2

## 2024-05-31 MED ORDER — FENTANYL CITRATE (PF) 100 MCG/2ML IJ SOLN
INTRAMUSCULAR | Status: AC
  Filled 2024-05-31: qty 2

## 2024-05-31 MED ORDER — LIDOCAINE HCL 1 % IJ SOLN
20.0000 mL | Freq: Once | INTRAMUSCULAR | Status: AC
  Administered 2024-05-31: 09:00:00 10 mL

## 2024-05-31 MED FILL — Midazolam HCl Inj 2 MG/2ML (Base Equivalent): INTRAMUSCULAR | Qty: 2 | Status: AC

## 2024-05-31 MED FILL — Fentanyl Citrate Preservative Free (PF) Inj 100 MCG/2ML: INTRAMUSCULAR | Qty: 2 | Status: AC

## 2024-05-31 NOTE — Progress Notes (Signed)
 Subjective: Patient reports incisional pain.  She had drain replaced this morning under fluoro. Her appetite is ok  Objective:  Blood pressure 118/75, pulse 67, temperature 98 F (36.7 C), temperature source Oral, resp. rate 18, weight 114.5 kg, last menstrual period 04/23/2024, SpO2 98%.  I have reviewed patient's vital signs, microbiology, and radiology results.  General: alert, cooperative, and no distress Resp: normal Cardio: regular rate and rhythm GI: soft, non-tender; bowel sounds normal; no masses,  no organomegaly and SS drainage in bulb from drain  CBC    Component Value Date/Time   WBC 13.0 (H) 05/31/2024 0500   RBC 3.29 (L) 05/31/2024 0500   HGB 9.5 (L) 05/31/2024 0500   HGB 9.4 (L) 06/03/2020 1046   HCT 29.9 (L) 05/31/2024 0500   HCT 29.2 (L) 06/03/2020 1046   PLT 673 (H) 05/31/2024 0500   PLT 371 06/03/2020 1046   MCV 90.9 05/31/2024 0500   MCV 92 06/03/2020 1046   MCH 28.9 05/31/2024 0500   MCHC 31.8 05/31/2024 0500   RDW 14.6 05/31/2024 0500   RDW 12.0 06/03/2020 1046   LYMPHSABS 1.2 05/26/2024 0439   LYMPHSABS 1.4 02/26/2020 1135   MONOABS 1.4 (H) 05/26/2024 0439   EOSABS 0.0 05/26/2024 0439   EOSABS 0.0 02/26/2020 1135   BASOSABS 0.1 05/26/2024 0439   BASOSABS 0.0 02/26/2020 1135    Assessment/Plan: Afebrile, continue ABX S/p transfusion Drain was replaced Heparin  followed by Eliquis  for ovarian vs thrombosis  LOS: 6 days    Lynwood Solomons, MD 05/31/2024, 12:50 PM

## 2024-05-31 NOTE — Procedures (Signed)
 Interventional Radiology Procedure Note  Procedure: fluoro exchg of the 12 fr pelvic drain    Complications: None  Estimated Blood Loss:  min  Findings: New 12 fr drain, attempted to reposition Connected to suction bulb    M. TREVOR Kalynn Declercq, MD

## 2024-05-31 NOTE — Progress Notes (Signed)
 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   Allergies[1]  Patient Measurements: Weight: 114.5 kg (252 lb 6 oz)  Vital Signs: Temp: 98.7 F (37.1 C) (12/17 0941) Temp Source: Oral (12/17 0941) BP: 129/70 (12/17 0941) Pulse Rate: 74 (12/17 0941)  Labs: Recent Labs    05/29/24 0420 05/30/24 0528 05/30/24 1620 05/30/24 2350 05/31/24 0500  HGB 7.3* 7.1*  --   --  9.5*  HCT 23.9* 22.7*  --   --  29.9*  PLT 618* 610*  --   --  673*  APTT  --   --  41*  --   --   LABPROT  --   --  14.6  --  14.5  INR  --   --  1.1  --  1.1  HEPARINUNFRC  --   --   --  0.17*  --   CREATININE 0.96 1.01*  --   --  0.99    Estimated Creatinine Clearance: 93.7 mL/min (by C-G formula based on SCr of 0.99 mg/dL).   Medical History: Past Medical History:  Diagnosis Date   Abnormal Pap smear    Colpo>normal   Anxiety    B12 deficiency 06/27/2013   EGD and Duodenal bx done in 3/15 at [facility] not showing celiac or pernicious anemia     Child behavior problem 03/27/2016   [facility]) noted the patient is having difficulty managing her twin daughters behavior 03/27/2016     Cholestasis during pregnancy in third trimester 07/12/2020   07/11/20 Bile acids 13.8     Fibroid    GERD (gastroesophageal reflux disease)    Gonorrhea    Headache    last migraine 01/2017   History of IBS    watch diet, no meds   History of reversal of tubal ligation 12/20/2019   Done in [place]  [facility] June [facility]     Infection    UTI   Multiple sclerosis    Neuromuscular disorder (HCC)    diagnosed with MS this visit   Obesity    Ovarian cyst    Pernicious anemia 05/02/2013   Pregnancy induced hypertension    Preterm labor    all 3 deliveries   Urinary tract infection    Vaginal Pap smear, abnormal     Medications:  Scheduled:   apixaban   10 mg Oral BID   Followed by   NOREEN ON 06/07/2024]  apixaban   5 mg Oral BID   ferrous sulfate   325 mg Oral QODAY   gabapentin   100 mg Oral BID   pantoprazole   40 mg Oral Daily   polyethylene glycol  17 g Oral QODAY   sodium chloride  flush  5 mL Intracatheter Q8H   Vitamin D  (Ergocalciferol )  50,000 Units Oral Q7 days   Infusions:   piperacillin -tazobactam 3.375 g (05/31/24 9362)   vancomycin  1,500 mg (05/30/24 2158)   PRN: alum & mag hydroxide-simeth, cyclobenzaprine , guaiFENesin , HYDROmorphone  (DILAUDID ) injection, menthol , ondansetron  **OR** ondansetron  (ZOFRAN ) IV, oxyCODONE , senna, simethicone   Assessment: 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 Previously on heparin  due to drain needing to be replaced. Patient has come back from IR and there is no future plans to return to surgery. Switching patient over to oral DOAC.     Plan:  Discontinue heparin . Initiate Eliquis  10 mg BID x7 days, then 5 mg BID thereafter.  Monitor platelets by anticoagulation  protocol: Yes   Rosina DEL Ulysees Robarts 05/31/2024,12:05 PM      [1] No Known Allergies

## 2024-05-31 NOTE — Telephone Encounter (Signed)
 Pharmacy Patient Advocate Encounter  Insurance verification completed.    The patient is insured through [employer] and medicaid  Ran test claim for Eliquis  5mg  tablet and the current 30 day co-pay is $0.  Ran test claim for Xarelto 20mg  tablet and the current 30 day co-pay is $0.  This test claim was processed through [employer]- copay amounts may vary at other pharmacies due to [employer], or as the patient moves through the different stages of their insurance plan.

## 2024-05-31 NOTE — Progress Notes (Signed)
 Progress Note   Patient: [name] FMW:[id number] DOB: September 30, 1985 DOA: 05/25/2024     6 DOS: the patient was seen and examined on 05/31/2024   Brief hospital course: [name]  is a 38 year old female with extensive history of multiple sclerosis, GERD, anxiety, B12 deficiency, menorrhagia, dysmenorrhea, pernicious anemia, pregnancy-induced hypertension, spontaneous vaginal delivery x 3, status post hysterectomy.  05/15/24- Status post  Transvaginal Hysterectomy, with right Salpingectomy 05/25/24 - postoperatively, postop day #10 developed intrapelvic abscess, hematoma.   05/25/24 Patient was readmitted to the hospital, IR was consulted, drain was placed, patient was started on broad-spectrum antibiotics.   Subsequently abscess drain grew beta-lactamase positive Bacteroides fragilis,  E. Coli, blood culture also grew bacteria with fragilis-resistant to ampicillin  otherwise pansensitive. Patient has been on IV antibiotics of Zosyn  and vancomycin .    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    Patient is noted for progressive anemia, transaminitis, improved leukocytosis.  [facility] hospitalist consulted for medical management, and treatment plan Regarding anticoagulation and treatment of gonadal vein thrombosis, ongoing infection.     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 Heparin  transition to Eliquis  per pharmacy protocol. Discussed risk and benefits of anticoagulation. Follow PCP upon discharge, AC for 3 months suggested.  Pelvic abscess in  female Postop pelvic abscess, drain in place, draining serosanguineous fluid CT pelvis reviewed continues to reveal 10 cm fluid collection, abscess versus hematoma. IR replaced the drain today 05/31/24. Continue broad spectrum antibiotics for now, follow cultures.  Pernicious anemia Obtaining iron  studies, B12 and folate Acute on chronic pernicious anemia Hb 9.5 today, continue to monitor H&H closely  Pros and cons of blood transfer discussed with the patient detail she is agreed to pursue  AKI (acute kidney injury) - Mildly elevated BUN/creatinine from baseline -Recommend gentle IV fluid hydration next 24 hours  Adynamic ileus (HCC) Improving, continue bowel regimen,  Acute relapsing multiple sclerosis (HCC) No flareup at this time Per patient under treatment with IVIG every 6 months Follow-up with primary oncologist recommended Discussed with Dr. Link of close follow-up with her primary oncologist given especially regarding her anemia and left gonadal vein thrombosis treatment.  Bacteremia 05/25/2024 blood cultures growing bacteria with fragilis-pansensitive, cultures from  05/28/2024-Repeat cultures -no growth to date Currently patient on unasyn  therapy.  Abnormal LFTs Elevated alkaline phosphatase level Likely elevated in the face of diffuse transaminitis Possibly reactive due to infection, hepatitis panel negative Trend LFT. Continue monitoring closely, avoiding hepatotoxins  Morbid obesity- BMI 43.32 Diet, exercise and weight reduction.    Out of bed to chair. Incentive spirometry. Nursing supportive care. Fall, aspiration precautions. Diet:  Diet Orders (From admission, onward)     Start     Ordered   05/31/24 1035  Diet regular Room service appropriate? Yes; Fluid consistency: Thin  Diet effective now       Question Answer Comment  Room service appropriate? Yes   Fluid consistency: Thin      05/31/24 1034           DVT prophylaxis: SCDs  Start: 05/25/24 2221  Level of care: Med-Surg   Code Status: Full  Code  Subjective: Patient is seen and examined today morning. She is lying in bed, able to get out of bed with help. Has some spotting. She did get drain replaced this morning.   Physical Exam: Vitals:   05/31/24 0910 05/31/24 0915 05/31/24 0924 05/31/24 0941  BP: 130/73 124/78 131/71 129/70  Pulse: 77 70 72 74  Resp: 16 20  18   Temp:    98.7 F (37.1 C)  TempSrc:    Oral  SpO2: 97% 99% 92% 93%  Weight:        General - Young  morbidly obese African American female, no apparent distress HEENT - PERRLA, EOMI, atraumatic head, non tender sinuses. Lung - distant breath sounds, basal rales, rhonchi, no wheezes. Heart - S1, S2 heard, no murmurs, rubs, trace pedal edema. Abdomen - Soft, non tender, obese, drain noted, bowel sounds good Neuro - Alert, awake and oriented x 3, non focal exam. Skin - Warm and dry.  Data Reviewed:      Latest Ref Rng & Units 05/31/2024    5:00 AM 05/30/2024    5:28 AM 05/29/2024    4:20 AM  CBC  WBC 4.0 - 10.5 K/uL 13.0  11.5  10.2   Hemoglobin 12.0 - 15.0 g/dL 9.5  7.1  7.3   Hematocrit 36.0 - 46.0 % 29.9  22.7  23.9   Platelets 150 - 400 K/uL 673  610  618       Latest Ref Rng & Units 05/31/2024    5:00 AM 05/30/2024    5:28 AM 05/29/2024    4:20 AM  BMP  Glucose 70 - 99 mg/dL 91  888  94   BUN 6 - 20 mg/dL 8  5  <5   Creatinine 9.55 - 1.00 mg/dL 9.00  8.98  9.03   Sodium 135 - 145 mmol/L 137  137  137   Potassium 3.5 - 5.1 mmol/L 4.3  3.8  3.9   Chloride 98 - 111 mmol/L 101  100  102   CO2 22 - 32 mmol/L 27  27  26    Calcium  8.9 - 10.3 mg/dL 8.9  8.5  8.4    IR Catheter Tube Change Result Date: 05/31/2024 INDICATION: PELVIC ABSCESS DRAIN, DECREASED OUTPUT, PERSISTENT COLLECTION BY FOLLOW-UP CT EXAM: FLUOROSCOPIC EXCHANGE AND CATHETER REPOSITION MEDICATIONS: The patient is currently admitted to the hospital and receiving intravenous antibiotics. The antibiotics were  administered within an appropriate time frame prior to the initiation of the procedure. ANESTHESIA/SEDATION: Moderate (conscious) sedation was employed during this procedure. A total of Versed  2.0 mg and Fentanyl  150 mcg was administered intravenously by the radiology nurse. Total intra-service moderate Sedation Time: 11 minutes. The patient's level of consciousness and vital signs were monitored continuously by radiology nursing throughout the procedure under my direct supervision. COMPLICATIONS: None immediate. PROCEDURE: Informed written consent was obtained from the patient after a thorough discussion of the procedural risks, benefits and alternatives. All questions were addressed. Maximal Sterile Barrier Technique was utilized including caps, mask, sterile gowns, sterile gloves, sterile drape, hand hygiene and skin antiseptic. A timeout was performed prior to the initiation of the procedure. Previous imaging reviewed. Preliminary injection performed of the transgluteal drain. Small loculated collection persist. Catheter removed over an Amplatz guidewire catheter successfully exchanged and slightly repositioned within the residual abscess cavity. Images obtained for documentation. Catheter secured with a silk suture and a sterile dressing. Gravity drainage bag connected. No immediate complication. Patient tolerated the procedure well. IMPRESSION: Successful fluoroscopic  exchange and slight reposition of the trans gluteal pelvic abscess drain Electronically Signed   By: CHRISTELLA.  Shick M.D.   On: 05/31/2024 10:28   CT ABDOMEN PELVIS W CONTRAST Result Date: 05/29/2024 EXAM: CT ABDOMEN AND PELVIS WITH CONTRAST 05/29/2024 07:57:00 PM TECHNIQUE: CT of the abdomen and pelvis was performed with the administration of 75 mL iohexol  (OMNIPAQUE ) 350 MG/ML injection. Multiplanar reformatted images are provided for review. Automated exposure control, iterative reconstruction, and/or weight-based adjustment of the mA/kV was  utilized to reduce the radiation dose to as low as reasonably achievable. COMPARISON: 05/25/2024 CLINICAL HISTORY: patient febrile s/p drain placement FINDINGS: LOWER CHEST: Mild bibasilar atelectasis. LIVER: The liver is unremarkable. GALLBLADDER AND BILE DUCTS: Status post cholecystectomy. No biliary ductal dilatation. SPLEEN: No acute abnormality. PANCREAS: No acute abnormality. ADRENAL GLANDS: No acute abnormality. KIDNEYS, URETERS AND BLADDER: No stones in the kidneys or ureters. No hydronephrosis. No perinephric or periureteral stranding. Urinary bladder is unremarkable. GI AND BOWEL: Stomach demonstrates no acute abnormality. There is no bowel obstruction. Normal appendix (image 66). PERITONEUM AND RETROPERITONEUM: No ascites. No free air. Interval placement of a percutaneous drain in the right pelvis, with residual central high density within an otherwise decompressed fluid pocket (image 87), which likely communicates with the larger collection. Lobulated 10.0 x 3.7 x 6.0 cm fluid collection in the surgical bed (sagittal image 78). VASCULATURE: Aorta is normal in caliber. 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 LYMPH NODES: No lymphadenopathy. REPRODUCTIVE ORGANS: Status post hysterectomy. Progressive enlargement/cystic changes of the left ovary (image 77). BONES AND SOFT TISSUES: No acute osseous abnormality. Right abdominal wall lipoma. IMPRESSION: 1. Interval placement of a percutaneous drain in the right pelvis with decompression of a fluid pocket containing residual central high density. 2. Adjacent 10.0 cm fluid collection in the surgical bed, likely communicating with the drain, seroma/hematoma versus abscess. Follow-up is recommended. 3. 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 Electronically signed by: Pinkie Pebbles MD 05/29/2024 09:58 PM EST RP Workstation: [id number]    Family  Communication: Discussed with patient, spouse at bedside. They understand and agree. All questions answered.  Disposition: Status is: Inpatient Remains inpatient appropriate because: per primary team  Planned Discharge Destination: Home     Time spent: 44 minutes  Author: Concepcion Riser, MD 05/31/2024 11:49 AM Secure chat 7am to 7pm For on call review [email or web address].

## 2024-05-31 NOTE — Progress Notes (Signed)
 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   Allergies[1]  Patient Measurements: Weight: 114.5 kg (252 lb 6 oz)  Vital Signs: Temp: 98.9 F (37.2 C) (12/17 0401) Temp Source: Oral (12/17 0401) BP: 114/56 (12/17 0403) Pulse Rate: 73 (12/17 0403)  Labs: Recent Labs    05/29/24 0420 05/30/24 0528 05/30/24 1620 05/30/24 2350 05/31/24 0500  HGB 7.3* 7.1*  --   --  9.5*  HCT 23.9* 22.7*  --   --  29.9*  PLT 618* 610*  --   --  673*  APTT  --   --  41*  --   --   LABPROT  --   --  14.6  --  14.5  INR  --   --  1.1  --  1.1  HEPARINUNFRC  --   --   --  0.17*  --   CREATININE 0.96 1.01*  --   --   --     Estimated Creatinine Clearance: 91.9 mL/min (A) (by C-G formula based on SCr of 1.01 mg/dL (H)).   Medical History: Past Medical History:  Diagnosis Date   Abnormal Pap smear    Colpo>normal   Anxiety    B12 deficiency 06/27/2013   EGD and Duodenal bx done in 3/15 at [facility] not showing celiac or pernicious anemia     Child behavior problem 03/27/2016   [facility]) noted the patient is having difficulty managing her twin daughters behavior 03/27/2016     Cholestasis during pregnancy in third trimester 07/12/2020   07/11/20 Bile acids 13.8     Fibroid    GERD (gastroesophageal reflux disease)    Gonorrhea    Headache    last migraine 01/2017   History of IBS    watch diet, no meds   History of reversal of tubal ligation 12/20/2019   Done in [place]  [facility] June [facility]     Infection    UTI   Multiple sclerosis    Neuromuscular disorder (HCC)    diagnosed with MS this visit   Obesity    Ovarian cyst    Pernicious anemia 05/02/2013   Pregnancy induced hypertension    Preterm labor    all 3 deliveries   Urinary tract infection    Vaginal Pap smear, abnormal     Medications:  Scheduled:   ferrous sulfate   325 mg Oral QODAY   gabapentin   100 mg Oral  BID   heparin   3,450 Units Intravenous Once   pantoprazole   40 mg Oral Daily   polyethylene glycol  17 g Oral QODAY   sodium chloride  flush  5 mL Intracatheter Q8H   Vitamin D  (Ergocalciferol )  50,000 Units Oral Q7 days   Infusions:   heparin  2,000 Units/hr (05/31/24 0518)   piperacillin -tazobactam 3.375 g (05/31/24 9362)   vancomycin  1,500 mg (05/30/24 2158)   PRN: alum & mag hydroxide-simeth, cyclobenzaprine , guaiFENesin , HYDROmorphone  (DILAUDID ) injection, menthol , ondansetron  **OR** ondansetron  (ZOFRAN ) IV, oxyCODONE , senna, simethicone   Assessment: 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 Heparin  infusion at 2000 units/hr with no bolus initiated, Patient heparin  level returned at 0.17, below the goal of 0.3-0.7 units/ml. Heparin  will be bolused and increased.  Patient scheduled for 12/17 for drain injection with possible exchange/reposition. Unknown time when that will take place.   Goal of Therapy:  Heparin  level 0.3-0.7 units/ml Monitor platelets by anticoagulation protocol: Yes  Plan:  Give 3450 units bolus x1 Increase heparin  infusion to 2400 units/hour Check heparin  level in 6 hours from start and daily while on heparin  Continue to monitor H&H and platelets  Heparin  to be held several hours prior to drain evaluation in IR.  [name] 05/31/2024,6:43 AM      [1] No Known Allergies

## 2024-06-01 ENCOUNTER — Other Ambulatory Visit (HOSPITAL_COMMUNITY): Payer: Self-pay

## 2024-06-01 DIAGNOSIS — R7881 Bacteremia: Secondary | ICD-10-CM | POA: Diagnosis not present

## 2024-06-01 DIAGNOSIS — I8290 Acute embolism and thrombosis of unspecified vein: Secondary | ICD-10-CM | POA: Diagnosis not present

## 2024-06-01 DIAGNOSIS — N739 Female pelvic inflammatory disease, unspecified: Secondary | ICD-10-CM | POA: Diagnosis not present

## 2024-06-01 DIAGNOSIS — D510 Vitamin B12 deficiency anemia due to intrinsic factor deficiency: Secondary | ICD-10-CM | POA: Diagnosis not present

## 2024-06-01 LAB — CBC
HCT: 31 % — ABNORMAL LOW (ref 36.0–46.0)
Hemoglobin: 9.7 g/dL — ABNORMAL LOW (ref 12.0–15.0)
MCH: 29 pg (ref 26.0–34.0)
MCHC: 31.3 g/dL (ref 30.0–36.0)
MCV: 92.8 fL (ref 80.0–100.0)
Platelets: 695 K/uL — ABNORMAL HIGH (ref 150–400)
RBC: 3.34 MIL/uL — ABNORMAL LOW (ref 3.87–5.11)
RDW: 14.6 % (ref 11.5–15.5)
WBC: 14.4 K/uL — ABNORMAL HIGH (ref 4.0–10.5)
nRBC: 0.3 % — ABNORMAL HIGH (ref 0.0–0.2)

## 2024-06-01 LAB — TYPE AND SCREEN
ABO/RH(D): A POS
Antibody Screen: NEGATIVE
Unit division: 0
Unit division: 0

## 2024-06-01 LAB — BPAM RBC
Blood Product Expiration Date: 202601122359
Blood Product Expiration Date: 202601122359
ISSUE DATE / TIME: 202512161938
ISSUE DATE / TIME: 202512170144
Unit Type and Rh: 6200
Unit Type and Rh: 6200

## 2024-06-01 LAB — COMPREHENSIVE METABOLIC PANEL WITH GFR
ALT: 86 U/L — ABNORMAL HIGH (ref 0–44)
AST: 41 U/L (ref 15–41)
Albumin: 3.3 g/dL — ABNORMAL LOW (ref 3.5–5.0)
Alkaline Phosphatase: 233 U/L — ABNORMAL HIGH (ref 38–126)
Anion gap: 7 (ref 5–15)
BUN: 6 mg/dL (ref 6–20)
CO2: 28 mmol/L (ref 22–32)
Calcium: 9.2 mg/dL (ref 8.9–10.3)
Chloride: 102 mmol/L (ref 98–111)
Creatinine, Ser: 0.92 mg/dL (ref 0.44–1.00)
GFR, Estimated: >60 mL/min (ref >60)
Glucose, Bld: 98 mg/dL (ref 70–99)
Potassium: 4.3 mmol/L (ref 3.5–5.1)
Sodium: 137 mmol/L (ref 135–145)
Total Bilirubin: 0.7 mg/dL (ref 0.0–1.2)
Total Protein: 6.3 g/dL — ABNORMAL LOW (ref 6.5–8.1)

## 2024-06-01 MED ORDER — SODIUM CHLORIDE 0.9% FLUSH: 5.0000 mL | Freq: Three times a day (TID) | INTRAVENOUS | Status: DC

## 2024-06-01 NOTE — Plan of Care (Signed)

## 2024-06-01 NOTE — Progress Notes (Signed)
 Gynecology Progress Note  Admission Date: 05/25/2024 Current Date: 06/01/2024 10:03 AM  [name] is a 38 y.o. 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  She is eating without difficulty.  No nausea/vomiting.  No real flatus.  Pain is well controlled.  Objective:   Vitals:   05/31/24 2340 06/01/24 0334 06/01/24 0552 06/01/24 0827  BP: 123/86 (!) 106/55  127/75  Pulse: 98 74  72  Resp: (P) 16 16  18   Temp: (P) 98 F (36.7 C) 98.2 F (36.8 C)  98.1 F (36.7 C)  TempSrc: (P) Oral Oral  Oral  SpO2: 99% 100%  96%  Weight:   113.9 kg     Temp:  [97.6 F (36.4 C)-98.4 F (36.9 C)] 98.1 F (36.7 C) (12/18 0827) Pulse Rate:  [66-98] 72 (12/18 0827) Resp:  [16-18] 18 (12/18 0827) BP: (106-132)/(55-86) 127/75 (12/18 0827) SpO2:  [96 %-100 %] 96 % (12/18 0827) Weight:  [113.9 kg] 113.9 kg (12/18 0552) I/O last 3 completed shifts: In: 1279.8 [I.V.:202.8; Blood:687.5; Other:15; IV Piggyback:374.6] Out: 25 [Drains:25] Total I/O In: 314.1 [IV Piggyback:314.1] Out: -   Intake/Output Summary (Last 24 hours) at 06/01/2024 1003 Last data filed at  06/01/2024 0801 Gross per 24 hour  Intake 334.05 ml  Output 15 ml  Net 319.05 ml     Current Vital Signs 24h Vital Sign Ranges  T 98.1 F (36.7 C) Temp  Avg: 98.1 F (36.7 C)  Min: 97.6 F (36.4 C)  Max: 98.4 F (36.9 C)  BP 127/75 BP  Min: 106/55  Max: 132/82  HR 72 Pulse  Avg: 76.5  Min: 66  Max: 98  RR 18 Resp  Avg: 17  Min: 16  Max: 18  SaO2 96 % Room Air SpO2  Avg: 98.8 %  Min: 96 %  Max: 100 %       24 Hour I/O Current Shift I/O  Time Ins Outs 12/17 0701 - 12/18 0700 In: 20 [I.V.:5] Out: 15 [Drains:15] 12/18 0701 - 12/18 1900 In: 314.1  Out: -    Patient Vitals for the past 12 hrs:  BP Temp Temp src Pulse Resp SpO2 Weight  06/01/24 0827 127/75 98.1 F (36.7 C) Oral 72 18 96 % --  06/01/24 0552 -- -- -- -- -- -- 113.9 kg  06/01/24 0334 (!) 106/55 98.2 F (36.8 C) Oral 74 16 100 % --  05/31/24 2340 123/86 (P) 98 F (36.7 C) (P) Oral 98 (P) 16 99 % --     Patient Vitals for the past 24 hrs:  BP Temp Temp src Pulse Resp SpO2 Weight  06/01/24 0827 127/75 98.1 F (36.7 C) Oral 72 18 96 % --  06/01/24 0552 -- -- -- -- -- -- 113.9 kg  06/01/24 0334 (!) 106/55 98.2 F (36.8 C) Oral 74 16 100 % --  05/31/24 2340 123/86 (P) 98 F (36.7 C) (P) Oral 98 (P) 16 99 % --  05/31/24 1943 132/82 98.4 F (36.9 C) Oral 66 16 100 % --  05/31/24 1531 113/63 97.6 F (36.4 C) Oral 82 18 100 % --  05/31/24 1249 118/75 98 F (36.7 C) Oral 67 18 98 % --    Physical exam: General appearance: alert, cooperative, appears stated age, and no distress Abdomen: soft, non-tender; bowel sounds normal; no masses,  no organomegaly GU: No gross VB Lungs: clear to auscultation bilaterally Heart: regular rate and rhythm Extremities: no lower extremity edema Skin: WNL Psych: appropriate Neurologic: Grossly normal  Medications Current Facility-Administered Medications  Medication Dose Route Frequency Provider Last Rate Last Admin   alum & mag hydroxide-simeth (MAALOX/MYLANTA)  200-200-20 MG/5ML suspension 30 mL  30 mL Oral Q4H PRN Pratt, Tanya S, MD       Ampicillin -Sulbactam (UNASYN ) 3 g in sodium chloride  0.9 % 100 mL IVPB  3 g Intravenous Q6H [name], MD   Stopped at 06/01/24 [phone]   apixaban  (ELIQUIS ) tablet 10 mg  10 mg Oral BID Izell Harari, MD   10 mg at 06/01/24 9045   Followed by   NOREEN ON 06/07/2024] apixaban  (ELIQUIS ) tablet 5 mg  5 mg Oral BID Izell Harari, MD       cyclobenzaprine  (FLEXERIL ) tablet 5-10 mg  5-10 mg Oral TID PRN Pratt, Tanya S, MD   10 mg at 05/30/24 0750   ferrous sulfate  tablet 325 mg  325 mg Oral QODAY Duncan, Paula, MD   325 mg at 05/31/24 1032   gabapentin  (NEURONTIN ) capsule 100 mg  100 mg Oral BID Pratt, Tanya S, MD   100 mg at 06/01/24 0954   guaiFENesin  (ROBITUSSIN) 100 MG/5ML liquid 15 mL  15 mL Oral Q4H PRN Fredirick Glenys RAMAN, MD       HYDROmorphone  (DILAUDID ) injection 1 mg  1 mg Intravenous Q2H PRN Pratt, Tanya S, MD   1 mg at 05/30/24 0016   menthol  (CEPACOL) lozenge 3 mg  1 lozenge Oral Q2H PRN Fredirick Glenys RAMAN, MD       ondansetron  (ZOFRAN ) tablet 4 mg  4 mg Oral Q6H PRN Fredirick Glenys RAMAN, MD       Or   ondansetron  (ZOFRAN ) injection 4 mg  4 mg Intravenous Q6H PRN Pratt, Tanya S, MD   4 mg at 05/28/24 2202   oxyCODONE  (Oxy IR/ROXICODONE ) immediate release tablet 5-10 mg  5-10 mg Oral Q4H PRN Cleatus Moccasin, MD   10 mg at 05/31/24 2340   pantoprazole  (PROTONIX ) EC tablet 40 mg  40 mg Oral Daily Izell Harari, MD   40 mg at 06/01/24 0954   polyethylene glycol (MIRALAX  / GLYCOLAX ) packet 17 g  17 g Oral QODAY Pickens, Charlie, MD   17 g at 05/31/24 1033   senna (SENOKOT) tablet 8.6 mg  1 tablet Oral QHS PRN Cleatus Moccasin, MD   8.6 mg at 05/28/24 1034   simethicone  (MYLICON) chewable tablet 80 mg  80 mg Oral QID PRN Forsyth, Kymberly C, MD   80 mg at 06/01/24 9166   sodium chloride  flush (NS) 0.9 % injection 5 mL  5 mL Intracatheter Q8H Karalee Wilkie POUR, MD   5 mL at 06/01/24 0556   sodium chloride  flush (NS) 0.9 %  injection 5  mL  5 mL Intracatheter Q8H Vanice Sharper, MD       Vitamin D  (Ergocalciferol ) (DRISDOL ) 1.25 MG (50000 UNIT) capsule 50,000 Units  50,000 Units Oral Q7 days Izell Harari, MD   50,000 Units at 05/30/24 1700   zolpidem  (AMBIEN ) tablet 5 mg  5 mg Oral QHS PRN Anyanwu, Ugonna A, MD   5 mg at 05/31/24 2341      Labs  Recent Labs  Lab 05/30/24 0528 05/31/24 0500 06/01/24 0817  WBC 11.5* 13.0* 14.4*  HGB 7.1* 9.5* 9.7*  HCT 22.7* 29.9* 31.0*  PLT 610* 673* 695*    Recent Labs  Lab 05/30/24 0528 05/31/24 0500 06/01/24 0817  NA 137 137 137  K 3.8 4.3 4.3  CL 100 101 102  CO2 27 27 28   BUN 5* 8 6  CREATININE 1.01* 0.99 0.92  CALCIUM  8.5* 8.9 9.2  PROT 5.7* 6.1* 6.3*  BILITOT 1.2 0.9 0.7  ALKPHOS 172* 263* 233*  ALT 86* 102* 86*  AST 50* 72* 41  GLUCOSE 111* 91 98    Radiology N/a  Assessment & Plan:  HD##7 *GYN: continue IV antibiotics, once improved may need to curbside ID for oral regimen, possibly augmentin * drain management per IR H/h stable s/p transfusion *Pain: well controlled *FEN/GI: Increase ambulation, tolerating regular diet, need to increase ambulation to aid in flatus.  Recheck CMP, but currently LFTs are improving *PPx: pt now on eliquis  *Dispo: continue inpatient hospital stay, trend WBC  Code Status: Full Code   Jerilynn Buddle, MD Attending Center for [facility])

## 2024-06-01 NOTE — Progress Notes (Signed)
 Referring Physician(s): Dr. Vina Solian  Supervising Physician: Philip Cornet  Patient Status:  [name] - In-pt  Chief Complaint:  Pelvic abscess s/p right transgluteal drain placement in IR 05/26/24 and subsequent reposition on 05/31/24.  Brief History: [name] is a 38 y.o. female  inpatient. History of GERD, MS, menorrhea s/p hysterectomy with right salpingectomy on 05/15/24.  05/25/24: Presented to the ED at [facility] for abd pain, workup included CT: 6.4 cm pelvic fluid collection, concerning for abscess.  05/26/24: Dr. Karalee (IR) placed R TG drain. Fluid collection noted to likely be a post-op hematoma rather than abscess.  05/29/24: CT showed percutaneous drain in the right pelvis with decompression of a fluid pocket containing residual central high density. Adjacent 10.0 cm fluid collection in the surgical bed, likely communicating with the drain, seroma/hematoma versus abscess 05/31/24: Drain exchanged/repositioned in IR by Dr. Vanice (12Fr) under moderate sedation based on decreased drain output along with persistent collection on CT.   Subjective:  Patient is resting in bed, tells me that she is overall feeling better today, has eaten and walked more. Denies N/V. Endorses scant vaginal bleeding and chills intermittently. Notes that the TG drain is more uncomfortable since exchange but not unmanageable.   Allergies: Patient has no known allergies.  Medications: Prior to Admission medications  Medication Sig Start Date End Date Taking? Authorizing Provider  cyanocobalamin  (VITAMIN B12) 1000 MCG/ML injection Inject 1,000 mcg into the skin every 30 (thirty) days.   Yes [provider]  cyclobenzaprine  (FLEXERIL ) 10 MG tablet Take 5-10 mg by mouth 3 (three) times daily as needed for muscle spasms.   Yes [provider]  famotidine  (PEPCID ) 20 MG tablet Take 1 tablet (20 mg total) by mouth 2 (two) times daily. Patient taking differently: Take 20 mg by mouth  daily as needed for heartburn or indigestion. 01/12/24  Yes Geiple, Joshua, PA-C  ocrelizumab (OCREVUS) 300 MG/10ML injection Inject 300 mg into the vein every 6 (six) months.   Yes [provider]  ondansetron  (ZOFRAN -ODT) 4 MG disintegrating tablet Take 1 tablet (4 mg total) by mouth every 6 (six) hours as needed for nausea. 05/16/24  Yes Constant, Peggy, MD  pantoprazole  (PROTONIX ) 40 MG tablet Take 40 mg by mouth daily as needed (acid reflux).   Yes [provider]  Vitamin D , Ergocalciferol , (DRISDOL ) 1.25 MG (50000 UNIT) CAPS capsule Take 50,000 Units by mouth once a week.   Yes [provider]  ferrous sulfate  325 (65 FE) MG tablet Take 1 tablet (325 mg total) by mouth every other day. 05/29/24   Erik Kieth BROCKS, MD  oxyCODONE  (OXY IR/ROXICODONE ) 5 MG immediate release tablet Take 1 tablet (5 mg total) by mouth every 4 (four) hours as needed for breakthrough pain. 05/28/24   Erik Kieth BROCKS, MD  sodium chloride  flush 0.9 % SOLN injection Please flush catheter daily with 5-10 mL of saline. 05/28/24 07/28/24  Carim, Carlin LABOR, PA-C     Vital Signs: BP (!) 140/99 (BP Location: Left Arm)   Pulse 81   Temp 98 F (36.7 C) (Oral)   Resp 18   Wt 251 lb (113.9 kg)   LMP 04/23/2024 (Approximate)   SpO2 98%   BMI 43.08 kg/m   Physical Exam Pulmonary:     Effort: Pulmonary effort is normal.  Abdominal:     Comments: R TG drain. Ext suture intact, F/A easily, scant serosanguinous material in bulb. No erythema or drainage of flush material around insertion site  of drain.   Skin:    General: Skin is warm and dry.  Neurological:     Mental Status: She is alert and oriented to person, place, and time.  Psychiatric:        Mood and Affect: Mood normal.        Behavior: Behavior normal.     Imaging: IR Catheter Tube Change Result Date: 05/31/2024 INDICATION: PELVIC ABSCESS DRAIN, DECREASED OUTPUT, PERSISTENT COLLECTION BY FOLLOW-UP CT EXAM: FLUOROSCOPIC  EXCHANGE AND CATHETER REPOSITION MEDICATIONS: The patient is currently admitted to the hospital and receiving intravenous antibiotics. The antibiotics were administered within an appropriate time frame prior to the initiation of the procedure. ANESTHESIA/SEDATION: Moderate (conscious) sedation was employed during this procedure. A total of Versed  2.0 mg and Fentanyl  150 mcg was administered intravenously by the radiology nurse. Total intra-service moderate Sedation Time: 11 minutes. The patient's level of consciousness and vital signs were monitored continuously by radiology nursing throughout the procedure under my direct supervision. COMPLICATIONS: None immediate. PROCEDURE: Informed written consent was obtained from the patient after a thorough discussion of the procedural risks, benefits and alternatives. All questions were addressed. Maximal Sterile Barrier Technique was utilized including caps, mask, sterile gowns, sterile gloves, sterile drape, hand hygiene and skin antiseptic. A timeout was performed prior to the initiation of the procedure. Previous imaging reviewed. Preliminary injection performed of the transgluteal drain. Small loculated collection persist. Catheter removed over an Amplatz guidewire catheter successfully exchanged and slightly repositioned within the residual abscess cavity. Images obtained for documentation. Catheter secured with a silk suture and a sterile dressing. Gravity drainage bag connected. No immediate complication. Patient tolerated the procedure well. IMPRESSION: Successful fluoroscopic exchange and slight reposition of the trans gluteal pelvic abscess drain Electronically Signed   By: CHRISTELLA.  Shick M.D.   On: 05/31/2024 10:28   CT ABDOMEN PELVIS W CONTRAST Result Date: 05/29/2024 EXAM: CT ABDOMEN AND PELVIS WITH CONTRAST 05/29/2024 07:57:00 PM TECHNIQUE: CT of the abdomen and pelvis was performed with the administration of 75 mL iohexol  (OMNIPAQUE ) 350 MG/ML injection.  Multiplanar reformatted images are provided for review. Automated exposure control, iterative reconstruction, and/or weight-based adjustment of the mA/kV was utilized to reduce the radiation dose to as low as reasonably achievable. COMPARISON: 05/25/2024 CLINICAL HISTORY: patient febrile s/p drain placement FINDINGS: LOWER CHEST: Mild bibasilar atelectasis. LIVER: The liver is unremarkable. GALLBLADDER AND BILE DUCTS: Status post cholecystectomy. No biliary ductal dilatation. SPLEEN: No acute abnormality. PANCREAS: No acute abnormality. ADRENAL GLANDS: No acute abnormality. KIDNEYS, URETERS AND BLADDER: No stones in the kidneys or ureters. No hydronephrosis. No perinephric or periureteral stranding. Urinary bladder is unremarkable. GI AND BOWEL: Stomach demonstrates no acute abnormality. There is no bowel obstruction. Normal appendix (image 66). PERITONEUM AND RETROPERITONEUM: No ascites. No free air. Interval placement of a percutaneous drain in the right pelvis, with residual central high density within an otherwise decompressed fluid pocket (image 87), which likely communicates with the larger collection. Lobulated 10.0 x 3.7 x 6.0 cm fluid collection in the surgical bed (sagittal image 78). VASCULATURE: Aorta is normal in caliber. 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 LYMPH NODES: No lymphadenopathy. REPRODUCTIVE ORGANS: Status post hysterectomy. Progressive enlargement/cystic changes of the left ovary (image 77). BONES AND SOFT TISSUES: No acute osseous abnormality. Right abdominal wall lipoma. IMPRESSION: 1. Interval placement of a percutaneous drain in the right pelvis with decompression of a fluid pocket containing residual central high density. 2. Adjacent 10.0 cm fluid collection in the surgical bed,  likely communicating with the drain, seroma/hematoma versus abscess. Follow-up is recommended. 3. 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 Electronically signed by: Pinkie Pebbles MD 05/29/2024 09:58 PM EST RP Workstation: [id number]    Labs:  CBC: Recent Labs    05/29/24 0420 05/30/24 0528 05/31/24 0500 06/01/24 0817  WBC 10.2 11.5* 13.0* 14.4*  HGB 7.3* 7.1* 9.5* 9.7*  HCT 23.9* 22.7* 29.9* 31.0*  PLT 618* 610* 673* 695*    COAGS: Recent Labs    05/30/24 1620 05/31/24 0500  INR 1.1 1.1  APTT 41*  --     BMP: Recent Labs    05/29/24 0420 05/30/24 0528 05/31/24 0500 06/01/24 0817  NA 137 137 137 137  K 3.9 3.8 4.3 4.3  CL 102 100 101 102  CO2 26 27 27 28   GLUCOSE 94 111* 91 98  BUN <5* 5* 8 6  CALCIUM  8.4* 8.5* 8.9 9.2  CREATININE 0.96 1.01* 0.99 0.92  GFRNONAA >60 >60 >60 >60    LIVER FUNCTION TESTS: Recent Labs    05/29/24 0420 05/30/24 0528 05/31/24 0500 06/01/24 0817  BILITOT 1.6* 1.2 0.9 0.7  AST 74* 50* 72* 41  ALT 104* 86* 102* 86*  ALKPHOS 164* 172* 263* 233*  PROT 5.8* 5.7* 6.1* 6.3*  ALBUMIN  2.3* 2.2* 3.2* 3.3*    Assessment and Plan:  Drain Location: R TG Size: Fr size: 12 Fr Date of placement: 12/12, replaced 12/17  Currently to: Drain collection device: suction bulb 24 hour output:  Output by Drain (mL) 05/30/24 0701 - 05/30/24 1900 05/30/24 1901 - 05/31/24 0700 05/31/24 0701 - 05/31/24 1900 05/31/24 1901 - 06/01/24 0700 06/01/24 0701 - 06/01/24 1312  Closed System Drain Inferior;Right Back 12 Fr.    15     Interval imaging/drain manipulation:  05/29/24 CT - perc drain in residual central high density within an otherwise decompressed fluid pocket (image 87), which likely communicates with the larger Collection: lobulated 10.0 x 3.7 x 6.0 cm fluid collection in the surgical bed   Current examination: Flushes/aspirates easily.  Insertion site unremarkable. Suture and stat lock in place. Dressed appropriately.   Fluid collection culture + E coli. WBC trending up ( 11.5 > 13> 14.4 today) Zosyn  and Vanc  transitioned to Unasyn  by prim team today OP continues to be modest - 15 cc (10cc with flush volume excluded) so far today, 10 cc out yesterday. Serosanguinous appearance.  VSS, afebrile  Plan:  The drain continues to have little output after reposition, likely due to being a complex collection. Discussed case with IR attending, Dr. Philip. It would likely not benefit the patient to place additional drain into the collection. There is some evidence of developing L TOA but would not warrant a drain based on most recent imaging.   Will round on patient tomorrow, trend labs, consider repeat imaging based on exam.   Continue TID flushes with 5 cc NS. Record output Q shift. Dressing changes QD or PRN if soiled.  Call IR APP or on call IR MD if difficulty flushing or sudden change in drain output.   Discharge planning: Please contact IR APP or on call IR MD prior to patient d/c to ensure appropriate follow up plans are in place.  IR will continue to follow - please call with questions or concerns.     Electronically Signed: Laymon Coast, NP 06/01/2024, 12:37 PM   I spent a total of 15 Minutes at the  the patient's bedside AND on the patient's hospital floor or unit, greater than 50% of which was counseling/coordinating care for s/p R TG drain for pelvic fluid collection.

## 2024-06-01 NOTE — Progress Notes (Signed)
 Progress Note   Patient: [name] FMW:[id number] DOB: [birth year] DOA: 05/25/2024     7 DOS: the patient was seen and examined on 06/01/2024   Brief hospital course: [name]  is a 38 year old female with extensive history of multiple sclerosis, GERD, anxiety, B12 deficiency, menorrhagia, dysmenorrhea, pernicious anemia, pregnancy-induced hypertension, spontaneous vaginal delivery x 3, status post hysterectomy.  05/15/24- Status post  Transvaginal Hysterectomy, with right Salpingectomy 05/25/24 - postoperatively, postop day #10 developed intrapelvic abscess, hematoma.   05/25/24 Patient was readmitted to the hospital, IR was consulted, drain was placed, patient was started on broad-spectrum antibiotics.   Subsequently abscess drain grew beta-lactamase positive Bacteroides fragilis,  E. Coli, blood culture also grew Bacteroides fragilis-resistant to ampicillin  otherwise pansensitive. Patient has been on IV antibiotics of Zosyn  and vancomycin .  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  Patient is noted for progressive anemia, transaminitis, improved leukocytosis.  [facility] hospitalist consulted for medical management, and treatment plan Regarding anticoagulation and treatment of gonadal vein thrombosis, ongoing infection.   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 Heparin  transition to Eliquis  per pharmacy protocol. Discussed risk and benefits of anticoagulation. Follow PCP upon discharge, AC for 3 months suggested.  Pelvic abscess in female Postop  pelvic abscess, drain in place, draining serosanguineous fluid CT pelvis reviewed continues to reveal 10 cm fluid collection, abscess versus hematoma. IR replaced the drain today 05/31/24. Continue broad spectrum antibiotics for now, follow cultures.  Bacteremia 05/25/2024 blood cultures growing bacteria with fragilis-pansensitive, cultures from  05/28/2024-Repeat cultures -no growth to date Currently patient on unasyn  therapy appropriate for B fragilis. Can be sent home on oral Augmentin therapy on white count improves.  Pernicious anemia Obtaining iron  studies, B12 and folate Acute on chronic pernicious anemia Hb 9.5 today, continue to monitor H&H closely  Pros and cons of blood transfer discussed with the patient detail she is agreed to pursue  AKI (acute kidney injury) Improved with IV fluids.  Adynamic ileus (HCC) Improved, continue bowel regimen,  Acute relapsing multiple sclerosis (HCC) No flareup at this time Per patient under treatment with IVIG every 6 months Follow-up with primary oncologist recommended Close follow-up with her primary oncologist recommended given especially regarding her anemia and left gonadal vein thrombosis treatment.  Bacteremia 05/25/2024 blood cultures growing bacteria with fragilis-pansensitive, cultures from  05/28/2024-Repeat cultures -no growth to date Currently patient on unasyn  therapy appropriate for B fragilis. Can be sent home on oral Augmentin therapy on white count improves.  Abnormal LFTs Elevated alkaline phosphatase level Likely elevated in the face of diffuse transaminitis Possibly reactive due to infection, hepatitis panel negative Trend LFT. Continue monitoring closely, avoiding hepatotoxins  Morbid obesity- BMI 43.32 Diet, exercise and weight reduction.    Out of bed to chair. Incentive spirometry. Nursing supportive care. Fall, aspiration precautions. Diet:  Diet Orders (From admission, onward)     Start      Ordered   05/31/24 1035  Diet regular Room service appropriate? Yes; Fluid consistency: Thin  Diet effective now       Question Answer Comment  Room service appropriate? Yes   Fluid consistency:  Thin      05/31/24 1034           DVT prophylaxis: SCDs Start: 05/25/24 2221 apixaban  (ELIQUIS ) tablet 10 mg  apixaban  (ELIQUIS ) tablet 5 mg  Level of care: Med-Surg   Code Status: Full Code  Subjective: Patient is seen and examined today morning. She is able to get out of bed. Drain with some pink output, eating fair. No complaints.  Physical Exam: Vitals:   06/01/24 0334 06/01/24 0552 06/01/24 0827 06/01/24 1210  BP: (!) 106/55  127/75 (!) 140/99  Pulse: 74  72 81  Resp: 16  18 18   Temp: 98.2 F (36.8 C)  98.1 F (36.7 C) 98 F (36.7 C)  TempSrc: Oral  Oral Oral  SpO2: 100%  96% 98%  Weight:  113.9 kg      General - Young  morbidly obese African American female, no apparent distress HEENT - PERRLA, EOMI, atraumatic head, non tender sinuses. Lung - distant breath sounds, basal rales, rhonchi, no wheezes. Heart - S1, S2 heard, no murmurs, rubs, trace pedal edema. Abdomen - Soft, non tender, obese, drain noted, bowel sounds good Neuro - Alert, awake and oriented x 3, non focal exam. Skin - Warm and dry.  Data Reviewed:      Latest Ref Rng & Units 06/01/2024    8:17 AM 05/31/2024    5:00 AM 05/30/2024    5:28 AM  CBC  WBC 4.0 - 10.5 K/uL 14.4  13.0  11.5   Hemoglobin 12.0 - 15.0 g/dL 9.7  9.5  7.1   Hematocrit 36.0 - 46.0 % 31.0  29.9  22.7   Platelets 150 - 400 K/uL 695  673  610       Latest Ref Rng & Units 06/01/2024    8:17 AM 05/31/2024    5:00 AM 05/30/2024    5:28 AM  BMP  Glucose 70 - 99 mg/dL 98  91  888   BUN 6 - 20 mg/dL 6  8  5    Creatinine 0.44 - 1.00 mg/dL 9.07  9.00  8.98   Sodium 135 - 145 mmol/L 137  137  137   Potassium 3.5 - 5.1 mmol/L 4.3  4.3  3.8   Chloride 98 - 111 mmol/L 102  101  100   CO2 22 - 32 mmol/L 28  27  27    Calcium  8.9 -  10.3 mg/dL 9.2  8.9  8.5    IR Catheter Tube Change Result Date: 05/31/2024 INDICATION: PELVIC ABSCESS DRAIN, DECREASED OUTPUT, PERSISTENT COLLECTION BY FOLLOW-UP CT EXAM: FLUOROSCOPIC EXCHANGE AND CATHETER REPOSITION MEDICATIONS: The patient is currently admitted to the hospital and receiving intravenous antibiotics. The antibiotics were administered within an appropriate time frame prior to the initiation of the procedure. ANESTHESIA/SEDATION: Moderate (conscious) sedation was employed during this procedure. A total of Versed  2.0 mg and Fentanyl  150 mcg was administered intravenously by the radiology nurse. Total intra-service moderate Sedation Time: 11 minutes. The patient's level of consciousness and vital signs were monitored continuously by radiology nursing throughout the procedure under my direct supervision. COMPLICATIONS: None immediate. PROCEDURE: Informed written consent was obtained from the patient after a thorough discussion of the procedural risks, benefits and alternatives. All questions were addressed. Maximal Sterile Barrier Technique was utilized including caps, mask, sterile gowns, sterile gloves, sterile drape, hand hygiene and skin antiseptic. A timeout was performed prior to the initiation of the procedure. Previous imaging reviewed. Preliminary injection performed of the transgluteal drain. Small loculated collection  persist. Catheter removed over an Amplatz guidewire catheter successfully exchanged and slightly repositioned within the residual abscess cavity. Images obtained for documentation. Catheter secured with a silk suture and a sterile dressing. Gravity drainage bag connected. No immediate complication. Patient tolerated the procedure well. IMPRESSION: Successful fluoroscopic exchange and slight reposition of the trans gluteal pelvic abscess drain Electronically Signed   By: CHRISTELLA.  Shick M.D.   On: 05/31/2024 10:28    Family Communication: Discussed with patient, she understand  and agree. All questions answered.  Disposition: Status is: Inpatient Remains inpatient appropriate because: per primary team  Planned Discharge Destination: Home     Time spent: 43 minutes  Author: Concepcion Riser, MD 06/01/2024 1:20 PM Secure chat 7am to 7pm For on call review [email or web address].

## 2024-06-01 NOTE — Plan of Care (Deleted)
°  Problem: Education: Goal: Knowledge of General Education information will improve Description: Including pain rating scale, medication(s)/side effects and non-pharmacologic comfort measures 06/01/2024 1049 by Jackquline Camelia LABOR, RN Outcome: Progressing 06/01/2024 0840 by Jackquline Camelia LABOR, RN Outcome: Progressing   Problem: Health Behavior/Discharge Planning: Goal: Ability to manage health-related needs will improve 06/01/2024 1049 by Jackquline Camelia LABOR, RN Outcome: Progressing 06/01/2024 0840 by Jackquline Camelia LABOR, RN Outcome: Progressing   Problem: Clinical Measurements: Goal: Ability to maintain clinical measurements within normal limits will improve 06/01/2024 1049 by Jackquline Camelia LABOR, RN Outcome: Progressing 06/01/2024 0840 by Jackquline Camelia LABOR, RN Outcome: Progressing Goal: Will remain free from infection 06/01/2024 1049 by Jackquline Camelia LABOR, RN Outcome: Progressing 06/01/2024 0840 by Jackquline Camelia LABOR, RN Outcome: Progressing Goal: Diagnostic test results will improve 06/01/2024 1049 by Jackquline Camelia LABOR, RN Outcome: Progressing 06/01/2024 0840 by Jackquline Camelia LABOR, RN Outcome: Progressing Goal: Respiratory complications will improve 06/01/2024 1049 by Jackquline Camelia LABOR, RN Outcome: Progressing 06/01/2024 0840 by Jackquline Camelia LABOR, RN Outcome: Progressing Goal: Cardiovascular complication will be avoided 06/01/2024 1049 by Jackquline Camelia LABOR, RN Outcome: Progressing 06/01/2024 0840 by Jackquline Camelia LABOR, RN Outcome: Progressing   Problem: Activity: Goal: Risk for activity intolerance will decrease 06/01/2024 1049 by Jackquline Camelia LABOR, RN Outcome: Progressing 06/01/2024 0840 by Jackquline Camelia LABOR, RN Outcome: Progressing   Problem: Nutrition: Goal: Adequate nutrition will be maintained 06/01/2024 1049 by Jackquline Camelia LABOR, RN Outcome: Progressing 06/01/2024 0840 by Jackquline Camelia LABOR, RN Outcome: Progressing   Problem: Coping: Goal: Level  of anxiety will decrease 06/01/2024 1049 by Jackquline Camelia LABOR, RN Outcome: Progressing 06/01/2024 0840 by Jackquline Camelia LABOR, RN Outcome: Progressing   Problem: Elimination: Goal: Will not experience complications related to bowel motility 06/01/2024 1049 by Jackquline Camelia LABOR, RN Outcome: Progressing 06/01/2024 0840 by Jackquline Camelia LABOR, RN Outcome: Progressing Goal: Will not experience complications related to urinary retention 06/01/2024 1049 by Jackquline Camelia LABOR, RN Outcome: Progressing 06/01/2024 0840 by Jackquline Camelia LABOR, RN Outcome: Progressing   Problem: Pain Managment: Goal: General experience of comfort will improve and/or be controlled 06/01/2024 1049 by Jackquline Camelia LABOR, RN Outcome: Progressing 06/01/2024 0840 by Jackquline Camelia LABOR, RN Outcome: Progressing   Problem: Safety: Goal: Ability to remain free from injury will improve 06/01/2024 1049 by Jackquline Camelia LABOR, RN Outcome: Progressing 06/01/2024 0840 by Jackquline Camelia LABOR, RN Outcome: Progressing   Problem: Skin Integrity: Goal: Risk for impaired skin integrity will decrease 06/01/2024 1049 by Jackquline Camelia LABOR, RN Outcome: Progressing 06/01/2024 0840 by Jackquline Camelia LABOR, RN Outcome: Progressing

## 2024-06-01 NOTE — Plan of Care (Signed)

## 2024-06-02 ENCOUNTER — Other Ambulatory Visit (HOSPITAL_COMMUNITY): Payer: Self-pay

## 2024-06-02 DIAGNOSIS — D510 Vitamin B12 deficiency anemia due to intrinsic factor deficiency: Secondary | ICD-10-CM | POA: Diagnosis not present

## 2024-06-02 DIAGNOSIS — I8290 Acute embolism and thrombosis of unspecified vein: Secondary | ICD-10-CM | POA: Diagnosis not present

## 2024-06-02 DIAGNOSIS — R7881 Bacteremia: Secondary | ICD-10-CM | POA: Diagnosis not present

## 2024-06-02 DIAGNOSIS — N739 Female pelvic inflammatory disease, unspecified: Secondary | ICD-10-CM | POA: Diagnosis not present

## 2024-06-02 LAB — CBC
HCT: 29.6 % — ABNORMAL LOW (ref 36.0–46.0)
Hemoglobin: 9.2 g/dL — ABNORMAL LOW (ref 12.0–15.0)
MCH: 28.7 pg (ref 26.0–34.0)
MCHC: 31.1 g/dL (ref 30.0–36.0)
MCV: 92.2 fL (ref 80.0–100.0)
Platelets: 684 K/uL — ABNORMAL HIGH (ref 150–400)
RBC: 3.21 MIL/uL — ABNORMAL LOW (ref 3.87–5.11)
RDW: 14.3 % (ref 11.5–15.5)
WBC: 13.1 K/uL — ABNORMAL HIGH (ref 4.0–10.5)
nRBC: 0.2 % (ref 0.0–0.2)

## 2024-06-02 LAB — COMPREHENSIVE METABOLIC PANEL WITH GFR
ALT: 60 U/L — ABNORMAL HIGH (ref 0–44)
AST: 30 U/L (ref 15–41)
Albumin: 3.1 g/dL — ABNORMAL LOW (ref 3.5–5.0)
Alkaline Phosphatase: 201 U/L — ABNORMAL HIGH (ref 38–126)
Anion gap: 16 — ABNORMAL HIGH (ref 5–15)
BUN: 7 mg/dL (ref 6–20)
CO2: 24 mmol/L (ref 22–32)
Calcium: 8.5 mg/dL — ABNORMAL LOW (ref 8.9–10.3)
Chloride: 104 mmol/L (ref 98–111)
Creatinine, Ser: 0.97 mg/dL (ref 0.44–1.00)
GFR, Estimated: >60 mL/min
Glucose, Bld: 87 mg/dL (ref 70–99)
Potassium: 3.9 mmol/L (ref 3.5–5.1)
Sodium: 144 mmol/L (ref 135–145)
Total Bilirubin: 0.5 mg/dL (ref 0.0–1.2)
Total Protein: 5.9 g/dL — ABNORMAL LOW (ref 6.5–8.1)

## 2024-06-02 LAB — CULTURE, BLOOD (ROUTINE X 2)
Culture: NO GROWTH
Culture: NO GROWTH
Special Requests: ADEQUATE
Special Requests: ADEQUATE

## 2024-06-02 MED ORDER — FOLIC ACID 1 MG PO TABS
1.0000 mg | ORAL_TABLET | Freq: Every day | ORAL | Status: DC
  Administered 2024-06-02: 1 mg via ORAL
  Filled 2024-06-02: qty 1

## 2024-06-02 NOTE — Care Management Important Message (Signed)
 Important Message  Patient Details  Name: [name] MRN: [id number] Date of Birth: [birth year]   Important Message Given:  Yes - Medicare IM     Vonzell Arrie Sharps 06/02/2024, 8:01 AM

## 2024-06-02 NOTE — Progress Notes (Signed)
 "   Referring Physician(s): Dr. Vina Solian  Supervising Physician: Vanice Revel  Patient Status:  [name] - In-pt  Chief Complaint:  Pelvic abscess s/p right transgluteal drain placement in IR 05/26/24 and subsequent reposition on 05/31/24.  Brief History: [name] is a 38 y.o. female  inpatient. History of GERD, MS, menorrhea s/p hysterectomy with right salpingectomy on 05/15/24.  05/25/24: Presented to the ED at [facility] for abd pain, workup included CT: 6.4 cm pelvic fluid collection, concerning for abscess.  05/26/24: Dr. Karalee (IR) placed R TG drain. Fluid collection noted to likely be a post-op hematoma rather than abscess.  05/29/24: CT showed percutaneous drain in the right pelvis with decompression of a fluid pocket containing residual central high density. Adjacent 10.0 cm fluid collection in the surgical bed, likely communicating with the drain, seroma/hematoma versus abscess 05/31/24: Drain exchanged/repositioned in IR by Dr. Vanice (12Fr) under moderate sedation based on decreased drain output along with persistent collection on CT.   Subjective:  Patient in room walking, NAD.  States that she is feeling fine today. She was told if her white blood cell count continues to go down, she may be able to go home tomorrow.     Allergies: Patient has no known allergies.  Medications: Prior to Admission medications  Medication Sig Start Date End Date Taking? Authorizing Provider  cyanocobalamin  (VITAMIN B12) 1000 MCG/ML injection Inject 1,000 mcg into the skin every 30 (thirty) days.   Yes [provider]  cyclobenzaprine  (FLEXERIL ) 10 MG tablet Take 5-10 mg by mouth 3 (three) times daily as needed for muscle spasms.   Yes [provider]  famotidine  (PEPCID ) 20 MG tablet Take 1 tablet (20 mg total) by mouth 2 (two) times daily. Patient taking differently: Take 20 mg by mouth daily as needed for heartburn or indigestion. 01/12/24  Yes Geiple, Joshua, PA-C   ocrelizumab (OCREVUS) 300 MG/10ML injection Inject 300 mg into the vein every 6 (six) months.   Yes [provider]  ondansetron  (ZOFRAN -ODT) 4 MG disintegrating tablet Take 1 tablet (4 mg total) by mouth every 6 (six) hours as needed for nausea. 05/16/24  Yes Constant, Peggy, MD  pantoprazole  (PROTONIX ) 40 MG tablet Take 40 mg by mouth daily as needed (acid reflux).   Yes [provider]  Vitamin D , Ergocalciferol , (DRISDOL ) 1.25 MG (50000 UNIT) CAPS capsule Take 50,000 Units by mouth once a week.   Yes [provider]  ferrous sulfate  325 (65 FE) MG tablet Take 1 tablet (325 mg total) by mouth every other day. 05/29/24   Erik Kieth BROCKS, MD  oxyCODONE  (OXY IR/ROXICODONE ) 5 MG immediate release tablet Take 1 tablet (5 mg total) by mouth every 4 (four) hours as needed for breakthrough pain. 05/28/24   Erik Kieth BROCKS, MD  sodium chloride  flush 0.9 % SOLN injection Please flush catheter daily with 5-10 mL of saline. 05/28/24 07/28/24  Carim, Carlin LABOR, PA-C     Vital Signs: BP 137/85 (BP Location: Left Arm)   Pulse 76   Temp 98 F (36.7 C) (Oral)   Resp 14   Wt 251 lb (113.9 kg)   LMP 04/23/2024 (Approximate)   SpO2 100%   BMI 43.08 kg/m   Physical Exam Pulmonary:     Effort: Pulmonary effort is normal.  Abdominal:     Comments: Positive R TG drain to a suction bulb. Site is unremarkable with no erythema, edema, tenderness, bleeding or drainage. Suture and stat lock in place. Dressing is clean,  dry, and intact. ~5 ml of  serosanguinous fluid noted in the bulb. Drain aspirates and flushes well.    Musculoskeletal:     Cervical back: Neck supple.  Skin:    General: Skin is warm and dry.     Coloration: Skin is not jaundiced or pale.  Neurological:     Mental Status: She is alert and oriented to person, place, and time.  Psychiatric:        Mood and Affect: Mood normal.        Behavior: Behavior normal.     Imaging: IR Catheter Tube  Change Result Date: 05/31/2024 INDICATION: PELVIC ABSCESS DRAIN, DECREASED OUTPUT, PERSISTENT COLLECTION BY FOLLOW-UP CT EXAM: FLUOROSCOPIC EXCHANGE AND CATHETER REPOSITION MEDICATIONS: The patient is currently admitted to the hospital and receiving intravenous antibiotics. The antibiotics were administered within an appropriate time frame prior to the initiation of the procedure. ANESTHESIA/SEDATION: Moderate (conscious) sedation was employed during this procedure. A total of Versed  2.0 mg and Fentanyl  150 mcg was administered intravenously by the radiology nurse. Total intra-service moderate Sedation Time: 11 minutes. The patient's level of consciousness and vital signs were monitored continuously by radiology nursing throughout the procedure under my direct supervision. COMPLICATIONS: None immediate. PROCEDURE: Informed written consent was obtained from the patient after a thorough discussion of the procedural risks, benefits and alternatives. All questions were addressed. Maximal Sterile Barrier Technique was utilized including caps, mask, sterile gowns, sterile gloves, sterile drape, hand hygiene and skin antiseptic. A timeout was performed prior to the initiation of the procedure. Previous imaging reviewed. Preliminary injection performed of the transgluteal drain. Small loculated collection persist. Catheter removed over an Amplatz guidewire catheter successfully exchanged and slightly repositioned within the residual abscess cavity. Images obtained for documentation. Catheter secured with a silk suture and a sterile dressing. Gravity drainage bag connected. No immediate complication. Patient tolerated the procedure well. IMPRESSION: Successful fluoroscopic exchange and slight reposition of the trans gluteal pelvic abscess drain Electronically Signed   By: CHRISTELLA.  Shick M.D.   On: 05/31/2024 10:28   CT ABDOMEN PELVIS W CONTRAST Result Date: 05/29/2024 EXAM: CT ABDOMEN AND PELVIS WITH CONTRAST 05/29/2024  07:57:00 PM TECHNIQUE: CT of the abdomen and pelvis was performed with the administration of 75 mL iohexol  (OMNIPAQUE ) 350 MG/ML injection. Multiplanar reformatted images are provided for review. Automated exposure control, iterative reconstruction, and/or weight-based adjustment of the mA/kV was utilized to reduce the radiation dose to as low as reasonably achievable. COMPARISON: 05/25/2024 CLINICAL HISTORY: patient febrile s/p drain placement FINDINGS: LOWER CHEST: Mild bibasilar atelectasis. LIVER: The liver is unremarkable. GALLBLADDER AND BILE DUCTS: Status post cholecystectomy. No biliary ductal dilatation. SPLEEN: No acute abnormality. PANCREAS: No acute abnormality. ADRENAL GLANDS: No acute abnormality. KIDNEYS, URETERS AND BLADDER: No stones in the kidneys or ureters. No hydronephrosis. No perinephric or periureteral stranding. Urinary bladder is unremarkable. GI AND BOWEL: Stomach demonstrates no acute abnormality. There is no bowel obstruction. Normal appendix (image 66). PERITONEUM AND RETROPERITONEUM: No ascites. No free air. Interval placement of a percutaneous drain in the right pelvis, with residual central high density within an otherwise decompressed fluid pocket (image 87), which likely communicates with the larger collection. Lobulated 10.0 x 3.7 x 6.0 cm fluid collection in the surgical bed (sagittal image 78). VASCULATURE: Aorta is normal in caliber. 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 LYMPH NODES: No lymphadenopathy. REPRODUCTIVE ORGANS: Status post hysterectomy. Progressive enlargement/cystic changes of the left ovary (image 77). BONES AND SOFT TISSUES: No acute osseous  abnormality. Right abdominal wall lipoma. IMPRESSION: 1. Interval placement of a percutaneous drain in the right pelvis with decompression of a fluid pocket containing residual central high density. 2. Adjacent 10.0 cm fluid collection in the surgical bed, likely communicating with the  drain, seroma/hematoma versus abscess. Follow-up is recommended. 3. 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 Electronically signed by: Pinkie Pebbles MD 05/29/2024 09:58 PM EST RP Workstation: [id number]    Labs:  CBC: Recent Labs    05/30/24 0528 05/31/24 0500 06/01/24 0817 06/02/24 0500  WBC 11.5* 13.0* 14.4* 13.1*  HGB 7.1* 9.5* 9.7* 9.2*  HCT 22.7* 29.9* 31.0* 29.6*  PLT 610* 673* 695* 684*    COAGS: Recent Labs    05/30/24 1620 05/31/24 0500  INR 1.1 1.1  APTT 41*  --     BMP: Recent Labs    05/30/24 0528 05/31/24 0500 06/01/24 0817 06/02/24 0500  NA 137 137 137 144  K 3.8 4.3 4.3 3.9  CL 100 101 102 104  CO2 27 27 28 24   GLUCOSE 111* 91 98 87  BUN 5* 8 6 7   CALCIUM  8.5* 8.9 9.2 8.5*  CREATININE 1.01* 0.99 0.92 0.97  GFRNONAA >60 >60 >60 >60    LIVER FUNCTION TESTS: Recent Labs    05/30/24 0528 05/31/24 0500 06/01/24 0817 06/02/24 0500  BILITOT 1.2 0.9 0.7 0.5  AST 50* 72* 41 30  ALT 86* 102* 86* 60*  ALKPHOS 172* 263* 233* 201*  PROT 5.7* 6.1* 6.3* 5.9*  ALBUMIN  2.2* 3.2* 3.3* 3.1*    Assessment and Plan: 38 yo female with pelvic abscess s/p right transgluteal drain placement in IR 05/26/24 and subsequent reposition due to low output on 05/31/24.  WBCc trended down over night 14.4>>13.1  VSS Cx E coli  Output 25 mL overnight, serosanguinous today   Drain Location: R TG Size: Fr size: 12 Fr Date of placement: 12/12, reposition 12/17   Currently to: Drain collection device: suction bulb 24 hour output:  Output by Drain (mL) 05/31/24 0701 - 05/31/24 1900 05/31/24 1901 - 06/01/24 0700 06/01/24 0701 - 06/01/24 1900 06/01/24 1901 - 06/02/24 0700 06/02/24 0701 - 06/02/24 1424  Closed System Drain Inferior;Right Back 12 Fr.  15 5 20      Interval imaging/drain manipulation:  - 12/12;R TG drain placement  - 05/29/24 CT  perc drain in residual central high  density within an otherwise decompressed fluid pocket (image 87), which likely communicates with the larger Collection: lobulated 10.0 x 3.7 x 6.0 cm fluid collection in the surgical bed - 12/17 R TG reposition   Current examination: Flushes/aspirates easily.  Insertion site unremarkable. Suture and stat lock in place. Dressed appropriately.   Plan: Continue TID flushes with 5 cc NS. Record output Q shift. Dressing changes QD or PRN if soiled.  Call IR APP or on call IR MD if difficulty flushing or sudden change in drain output.  Repeat imaging/possible drain injection once output < 10 mL/QD (excluding flush material). Consideration for drain removal if output is < 10 mL/QD (excluding flush material), pending discussion with the providing surgical service.  Discharge planning:  Outpatient f/u has been arranged.  Patient will receive a call from schedulers for drain f/u.   IR will continue to follow - please call with questions or concerns.   Electronically Signed: Toya VEAR Cousin, PA-C 06/02/2024, 2:24 PM   I spent a total of 15 Minutes at the the patient's bedside AND  on the patient's hospital floor or unit, greater than 50% of which was counseling/coordinating care for s/p R TG drain for pelvic fluid collection.       "

## 2024-06-02 NOTE — Progress Notes (Signed)
 " Progress Note   Patient: [name] FMW:[id number] DOB: Feb 02, 1986 DOA: 05/25/2024     8 DOS: the patient was seen and examined on 06/02/2024   Brief hospital course: [name]  is a 38 year old female with extensive history of multiple sclerosis, GERD, anxiety, B12 deficiency, menorrhagia, dysmenorrhea, pernicious anemia, pregnancy-induced hypertension, spontaneous vaginal delivery x 3, status post hysterectomy.  05/15/24- Status post  Transvaginal Hysterectomy, with right Salpingectomy 05/25/24 - postoperatively, postop day #10 developed intrapelvic abscess, hematoma.   05/25/24 Patient was readmitted to the hospital, IR was consulted, drain was placed, patient was started on broad-spectrum antibiotics.   Subsequently abscess drain grew beta-lactamase positive Bacteroides fragilis,  E. Coli, blood culture also grew Bacteroides fragilis-resistant to ampicillin  otherwise pansensitive. Patient has been on IV antibiotics of Zosyn  and vancomycin .  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  Patient is noted for progressive anemia, transaminitis, improved leukocytosis.  TRH hospitalist consulted for medical management, and treatment plan Regarding anticoagulation and treatment of gonadal vein thrombosis, ongoing infection.   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 Heparin  transition to Eliquis  per pharmacy protocol. Discussed risk and benefits of anticoagulation. Follow PCP upon discharge, AC for 3 months suggested.  Pelvic abscess in female Postop  pelvic abscess, drain in place, draining serosanguineous fluid CT pelvis reviewed continues to reveal 10 cm fluid collection, abscess versus hematoma. IR replaced the drain today 05/31/24. IR to follow for drain management. Continue broad spectrum antibiotics for now, follow cultures. Augmentin  upon discharge, call ID for duration of therapy.  Bacteremia 05/25/2024 blood cultures growing bacteria with fragilis-pansensitive, cultures from  05/28/2024-Repeat cultures -no growth to date Currently patient on unasyn  therapy appropriate for B fragilis. Can be sent home on oral Augmentin  therapy once white count improves. Call ID for duration of oral antibiotic therapy.  Pernicious anemia Acute on chronic pernicious anemia Hb 9.5 today, continue to monitor H&H. Iron  and folate low, continue supplements.  AKI (acute kidney injury) Improved with IV fluids.  Adynamic ileus (HCC) Improved, continue bowel regimen,  Acute relapsing multiple sclerosis (HCC) No flareup at this time Per patient under treatment with IVIG every 6 months Follow-up with primary oncologist recommended Close follow-up with her primary oncologist recommended given especially regarding her anemia and left gonadal vein thrombosis treatment.  Abnormal LFTs Elevated alkaline phosphatase level Likely elevated in the face of diffuse transaminitis Possibly reactive due to infection, hepatitis panel negative Trend LFT. Continue monitoring closely, avoid hepatotoxins  Morbid obesity- BMI 43.32 Diet, exercise and weight reduction.    TRH team to sign off, call, secure chat for any questions or concerns. Diet:  Diet Orders (From admission, onward)     Start     Ordered   05/31/24 1035  Diet regular Room service appropriate? Yes; Fluid consistency: Thin  Diet effective now       Question Answer Comment  Room service appropriate? Yes   Fluid consistency: Thin      05/31/24 1034           DVT prophylaxis: SCDs  Start: 05/25/24 2221 apixaban  (ELIQUIS ) tablet 10 mg  apixaban  (ELIQUIS ) tablet  5 mg  Level of care: Med-Surg   Code Status: Full Code  Subjective: Patient is seen and examined today morning. She is able to get out of bed. Drain with some pink output, eating fair. No complaints.  Physical Exam: Vitals:   06/01/24 1929 06/02/24 0004 06/02/24 0956 06/02/24 1139  BP: 132/81 122/72 133/82 137/85  Pulse: 73 68 78 76  Resp: 18 18 15 14   Temp: 97.9 F (36.6 C) 98.4 F (36.9 C) 97.9 F (36.6 C) 98 F (36.7 C)  TempSrc: Oral Oral Oral Oral  SpO2: 100%  100% 100%  Weight:        General - Young  morbidly obese African American female, no apparent distress HEENT - PERRLA, EOMI, atraumatic head, non tender sinuses. Lung - distant breath sounds, basal rales, rhonchi, no wheezes. Heart - S1, S2 heard, no murmurs, rubs, trace pedal edema. Abdomen - Soft, non tender, obese, drain noted, bowel sounds good Neuro - Alert, awake and oriented x 3, non focal exam. Skin - Warm and dry.  Data Reviewed:      Latest Ref Rng & Units 06/02/2024    5:00 AM 06/01/2024    8:17 AM 05/31/2024    5:00 AM  CBC  WBC 4.0 - 10.5 K/uL 13.1  14.4  13.0   Hemoglobin 12.0 - 15.0 g/dL 9.2  9.7  9.5   Hematocrit 36.0 - 46.0 % 29.6  31.0  29.9   Platelets 150 - 400 K/uL 684  695  673       Latest Ref Rng & Units 06/02/2024    5:00 AM 06/01/2024    8:17 AM 05/31/2024    5:00 AM  BMP  Glucose 70 - 99 mg/dL 87  98  91   BUN 6 - 20 mg/dL 7  6  8    Creatinine 0.44 - 1.00 mg/dL 9.02  9.07  9.00   Sodium 135 - 145 mmol/L 144  137  137   Potassium 3.5 - 5.1 mmol/L 3.9  4.3  4.3   Chloride 98 - 111 mmol/L 104  102  101   CO2 22 - 32 mmol/L 24  28  27    Calcium  8.9 - 10.3 mg/dL 8.5  9.2  8.9    No results found.   Family Communication: Discussed with patient, she understand and agree. All questions answered.  Disposition: Status is: Inpatient Remains inpatient appropriate because: per primary team   Planned Discharge Destination: Home     Time spent: 45 minutes  Author: Concepcion Riser, MD 06/02/2024 1:52 PM Secure chat 7am to 7pm For on call review [email or web address].    "

## 2024-06-03 ENCOUNTER — Other Ambulatory Visit (HOSPITAL_COMMUNITY): Payer: Self-pay

## 2024-06-03 LAB — CBC
HCT: 34.2 % — ABNORMAL LOW (ref 36.0–46.0)
Hemoglobin: 10.8 g/dL — ABNORMAL LOW (ref 12.0–15.0)
MCH: 28.6 pg (ref 26.0–34.0)
MCHC: 31.6 g/dL (ref 30.0–36.0)
MCV: 90.7 fL (ref 80.0–100.0)
Platelets: 744 K/uL — ABNORMAL HIGH (ref 150–400)
RBC: 3.77 MIL/uL — ABNORMAL LOW (ref 3.87–5.11)
RDW: 14.3 % (ref 11.5–15.5)
WBC: 13.1 K/uL — ABNORMAL HIGH (ref 4.0–10.5)
nRBC: 0 % (ref 0.0–0.2)

## 2024-06-03 MED ORDER — OXYCODONE HCL 5 MG PO TABS: 5.0000 mg | ORAL_TABLET | ORAL | 0 refills | Status: AC | PRN

## 2024-06-03 MED ORDER — APIXABAN 5 MG PO TABS: ORAL_TABLET | ORAL | 2 refills | Status: AC

## 2024-06-03 MED ORDER — AMOXICILLIN-POT CLAVULANATE 875-125 MG PO TABS: 1.0000 | ORAL_TABLET | Freq: Two times a day (BID) | ORAL | 1 refills | Status: AC

## 2024-06-03 MED ORDER — FOLIC ACID 1 MG PO TABS: 1.0000 mg | ORAL_TABLET | Freq: Every day | ORAL | 0 refills | Status: AC

## 2024-06-03 NOTE — Discharge Summary (Signed)
 Physician Discharge Summary  Patient ID: [name] MRN: [id number] DOB/AGE: [birth year] 38 y.o.  Admit date: 05/25/2024 Discharge date: 06/03/2024  Admission Diagnoses:  Discharge Diagnoses:  Principal Problem:   Deep vein thrombosis (DVT) (HCC) Active Problems:   Pernicious anemia   Acute relapsing multiple sclerosis (HCC)   Obesity, Class III, BMI 40-49.9 (morbid obesity) (HCC)   Pelvic abscess in female   Adynamic ileus (HCC)   Abnormal LFTs   Elevated alkaline phosphatase level   AKI (acute kidney injury)   Bacteremia   Discharged Condition: good  Hospital Course: Pt was admitted post op day 10 from [facility].  Pt had pelvic pain and vaginal/pelvic pressure.  Pelvic abscess was noted on CT scan in the ER.  Drain was placed with CT guidance.  During the stay a thrombus was noted in the left ovarian vasculature.  Pt was started on heparin  drip and then transitioned to eliquis  for anticoagulation.  Drain was readjusted and patient continued to approve.  No major compliactions the remainder of the stay.  Pt was discharged home with the drain and oral antibiotics as well as oral anticoagulation.  Consults: interventional radiology, hospitalist  Significant Diagnostic Studies: radiology: CT scan: of abdomen and pelvis, CT guidance  Treatments: procedures: percutaneous drainage catheter placement and IV antibiotics  Discharge Exam: Blood pressure 130/79, pulse 92, temperature 97.9 F (36.6 C), temperature source Oral, resp. rate 18, weight 113.9 kg, last menstrual period 04/23/2024, SpO2 99%. General appearance: alert, cooperative, no distress, and mildly obese Head: Normocephalic, without obvious abnormality, atraumatic Resp: clear to auscultation bilaterally Cardio: regular rate and rhythm Extremities: Homans sign is negative, no sign of DVT Abdomen: NT/ND soft, positive bowel sounds Disposition: Discharge disposition: 01-Home or Self Care       Discharge Instructions      Call MD for:  persistant nausea and vomiting   Complete by: As directed    Call MD for:  severe uncontrolled pain   Complete by: As directed    Call MD for:  temperature >100.4   Complete by: As directed    Diet general   Complete by: As directed    Discharge wound care:   Complete by: As directed    Maintain clean dressing over drain site   Discharge wound care:   Complete by: As directed    Drain management per IR, they will contact the patient   Driving Restrictions   Complete by: As directed    No driving while taking narcotic pain medication   Increase activity slowly   Complete by: As directed    Increase activity slowly   Complete by: As directed    Lifting restrictions   Complete by: As directed    Avoid lifting more than 10-15lbs   Sexual Activity Restrictions   Complete by: As directed    Avoid sexual activity for 12 weeks   Sexual Activity Restrictions   Complete by: As directed    Pelvic rest 2 months      Allergies as of 06/03/2024   No Known Allergies      Medication List     TAKE these medications    amoxicillin -clavulanate 875-125 MG tablet Commonly known as: AUGMENTIN  Take 1 tablet by mouth 2 (two) times daily.   apixaban  5 MG Tabs tablet Commonly known as: ELIQUIS  Take 2 tablets (10 mg total) by mouth 2 (two) times daily for 4 days, THEN 1 tablet (5 mg total) 2 (two) times daily. Start taking on: June 03, 2024   cyanocobalamin  1000 MCG/ML injection Commonly known as: VITAMIN B12 Inject 1,000 mcg into the skin every 30 (thirty) days.   cyclobenzaprine  10 MG tablet Commonly known as: FLEXERIL  Take 5-10 mg by mouth 3 (three) times daily as needed for muscle spasms.   famotidine  20 MG tablet Commonly known as: PEPCID  Take 1 tablet (20 mg total) by mouth 2 (two) times daily. What changed:  when to take this reasons to take this   ferrous sulfate  325 (65 FE) MG tablet Take 1 tablet (325 mg total) by mouth every other day.    folic acid  1 MG tablet Commonly known as: FOLVITE  Take 1 tablet (1 mg total) by mouth daily.   Ocrevus 300 MG/10ML injection Generic drug: ocrelizumab Inject 300 mg into the vein every 6 (six) months.   ondansetron  4 MG disintegrating tablet Commonly known as: ZOFRAN -ODT Take 1 tablet (4 mg total) by mouth every 6 (six) hours as needed for nausea.   oxyCODONE  5 MG immediate release tablet Commonly known as: Oxy IR/ROXICODONE  Take 1 tablet (5 mg total) by mouth every 4 (four) hours as needed for breakthrough pain. What changed:  how much to take reasons to take this   oxyCODONE  5 MG immediate release tablet Commonly known as: Oxy IR/ROXICODONE  Take 1 tablet (5 mg total) by mouth every 4 (four) hours as needed for severe pain (pain score 7-10) or breakthrough pain. What changed: You were already taking a medication with the same name, and this prescription was added. Make sure you understand how and when to take each.   pantoprazole  40 MG tablet Commonly known as: PROTONIX  Take 40 mg by mouth daily as needed (acid reflux).   sodium chloride  flush 0.9 % Soln injection Please flush catheter daily with 5-10 mL of saline.   Vitamin D  (Ergocalciferol ) 1.25 MG (50000 UNIT) Caps capsule Commonly known as: DRISDOL  Take 50,000 Units by mouth once a week.               Discharge Care Instructions  (From admission, onward)           Start     Ordered   06/03/24 0000  Discharge wound care:       Comments: Drain management per IR, they will contact the patient   06/03/24 1025   05/28/24 0000  Discharge wound care:       Comments: Maintain clean dressing over drain site   05/28/24 0757            Follow-up Information     [facility] IR Imaging Follow up.   Specialty: Radiology Why: IR scheduler will call you in one week to 10 days to schedule outpatient folow-up in clinic. Please call if you have not heard from scheduler in 10 days. Contact  information: [address] [place]  [address] [phone]        [facility]: [facility] Rhonda W for [facility] [place]. Schedule an appointment as soon as possible for a visit in 2 week(s).   Specialty: Obstetrics and Gynecology Why: schedule with Dr. Eveline for hospital and surgery follow up Contact information: [address] [place]  [address] [phone]                Signed: Jerilynn DELENA Buddle 06/03/2024, 10:25 AM

## 2024-06-03 NOTE — Plan of Care (Signed)

## 2024-06-03 NOTE — Progress Notes (Signed)
 Pt requested not to be woken up from 0000 to 0600. Vital signs WNL and pt informed to use call bell for any needs throughout the night.

## 2024-06-06 ENCOUNTER — Other Ambulatory Visit: Payer: Self-pay | Admitting: Obstetrics & Gynecology

## 2024-06-06 DIAGNOSIS — R188 Other ascites: Secondary | ICD-10-CM

## 2024-06-08 ENCOUNTER — Inpatient Hospital Stay (HOSPITAL_COMMUNITY)
Admission: AD | Admit: 2024-06-08 | Discharge: 2024-06-08 | Disposition: A | Discharge status: Home / Self Care | Attending: Obstetrics & Gynecology | Admitting: Obstetrics & Gynecology

## 2024-06-08 DIAGNOSIS — Y831 Surgical operation with implant of artificial internal device as the cause of abnormal reaction of the patient, or of later complication, without mention of misadventure at the time of the procedure: Secondary | ICD-10-CM | POA: Insufficient documentation

## 2024-06-08 DIAGNOSIS — T81328A Disruption or dehiscence of closure of other specified internal operation (surgical) wound, initial encounter: Secondary | ICD-10-CM | POA: Diagnosis not present

## 2024-06-08 DIAGNOSIS — Z90710 Acquired absence of both cervix and uterus: Secondary | ICD-10-CM | POA: Insufficient documentation

## 2024-06-08 NOTE — MAU Note (Signed)
..  [name] is a 38 y.o. at Unknown here in MAU reporting: had a hysterectomy on 05/15/2024 Now since yesterday is having draining from around incision instead of drain. Today has had incisional sharp stabbing pain and vaginal pain that is constant. Took tylenol  around 1200, and it has not helped with pain.  Is concerned because she was recently admitted for 10 days due to complications from procedure.   Pain score: incision 8/10; vaginal 8/10 Vitals:   06/08/24 1959  BP: 126/61  Pulse: 87  Resp: 16  Temp: 98.6 F (37 C)  SpO2: 100%      Lab orders placed from triage:  none

## 2024-06-08 NOTE — MAU Provider Note (Signed)
 History     CSN: [id number]  Arrival date and time: 06/08/24 1945 First Provider Initiated Contact with Patient   Chief Complaint  Patient presents with   Post-op Problem    HPI [name] is a 38 y.o. [id number] at Unknown, Not found., who presents to the Maternity Assessment Unit for pain at drain site.   Patient reports pain and drainage at site of IR drain but no output into the bulb. Drain is in R glute. She reports that there is leakage around the drain when it is flushed and that her nurse reported now being able to see strings that she didn't see before. She is flushing 5mL once daily. She has developed pain when seated that is new. She is still taking abx and AC.   ROS (+) pain, drainage (-) f/c, rash, vaginal discharge  Chart review 12/1 [facility] 12/11 CT w/ 6.4cm fluid collection c/f pelvic abscess. Admitted to [facility].  12/12 R transgluteal drain placed by IR 12/15 fluid collection decompression, now 10cm collection ddx hematoma vs abscess 12/17 drain repositioned IR 12/20 Discharged  Past Medical History:  Diagnosis Date   Abnormal Pap smear    Colpo>normal   Anxiety    B12 deficiency 06/27/2013   EGD and Duodenal bx done in 3/15 at [facility] not showing celiac or pernicious anemia     Child behavior problem 03/27/2016   [facility]) noted the patient is having difficulty managing her twin daughters behavior 03/27/2016     Cholestasis during pregnancy in third trimester 07/12/2020   07/11/20 Bile acids 13.8     Fibroid    GERD (gastroesophageal reflux disease)    Gonorrhea    Headache    last migraine 01/2017   History of IBS    watch diet, no meds   History of reversal of tubal ligation 12/20/2019   Done in [place]  [facility]     Infection    UTI   Multiple sclerosis    Neuromuscular disorder (HCC)    diagnosed with MS this visit   Obesity    Ovarian cyst    Pernicious anemia 05/02/2013   Pregnancy induced hypertension     Preterm labor    all 3 deliveries   Urinary tract infection    Vaginal Pap smear, abnormal    Past Surgical History:  Procedure Laterality Date   CHOLECYSTECTOMY N/A 11/25/2017   Procedure: LAPAROSCOPIC CHOLECYSTECTOMY WITH INTRAOPERATIVE CHOLANGIOGRAM;  Surgeon: Eletha Boas, MD;  Location: [facility] ORS;  Service: General;  Laterality: N/A;   COLPOSCOPY     HYSTERECTOMY, VAGINAL, WITH SALPINGECTOMY  05/15/2024   Procedure: HYSTERECTOMY, VAGINAL, WITH RIGHT SALPINGECTOMY;  Surgeon: [name], MD;  Location: [facility] OR;  Service: Gynecology;;  [facility] BS   IR CATHETER TUBE CHANGE  05/31/2024   MYOMECTOMY N/A 04/01/2017   Procedure: Vaginal Myomectomy;  Surgeon: Herchel Gloris LABOR, MD;  Location: [facility] ORS;  Service: Gynecology;  Laterality: N/A;   TUBAL LIGATION     2012/ reversal June 2020   WISDOM TOOTH EXTRACTION     Allergies[1]  Physical Exam  BP 126/61 (BP Location: Right Arm)   Pulse 87   Temp 98.6 F (37 C) (Oral)   Resp 16   Ht 5' 4 (1.626 m)   Wt 111.1 kg   SpO2 100%   BMI 42.04 kg/m   Gen: alert, no acute distress CV: regular rate Resp: nonlabored Buttocks: Drain present on R, hub of the catheter is about  10cm from the skin. One of the ties on the drain has broken. Skin is c/d/i  w/o erythema or exudate. No fluctuance or induration. No leaking at drain site when flushed, however the saline was output vaginally.      Assessment and Plan  MDM [name] is a 38 y.o. [phone] s/p TVH w/ post-op pelvic abscess with IR drain in place who presents to the MAU for output at drain site. Ddx: drain displaced, expected decreased output from the drain, subcutaneous abscess, fistulization of the pelvic abscess..  No signs of new or worsening infection. Suspect gluteal pain was the stitch ripping. Discussed the output into the bulb is likely to be the fluid from the flush. Decreased output may otherwise indicate resolving infection. Pt feeling reassured after exam. She has IR f/u  scheduled, and she plans to call tomorrow to see if she can be seen sooner. Per their note on 12/19, can consider removal once output is <3mL/day exclude flush material. Possible vaginal fistula based on exam today, however this will be investigated by IR as part of their planned follow up. Continue abx and anticoagulation as prescribed at last discharge.   1. Disruption of tissue around surgical drain, initial encounter (Primary) - Discharge patient  2. Status post vaginal hysterectomy    Results pending at the time of DC: none Dispo: DC home in stable condition with return precautions discussed and included in AVS.    Meghan Maier, DO FM-OB Fellow [facility] for [facility]     [1] No Known Allergies

## 2024-06-09 ENCOUNTER — Ambulatory Visit (HOSPITAL_COMMUNITY)

## 2024-06-13 ENCOUNTER — Encounter (HOSPITAL_COMMUNITY): Payer: Self-pay

## 2024-06-13 ENCOUNTER — Ambulatory Visit (HOSPITAL_COMMUNITY)
Admission: RE | Admit: 2024-06-13 | Discharge: 2024-06-13 | Disposition: A | Discharge status: Home / Self Care | Source: Ambulatory Visit | Attending: Obstetrics & Gynecology | Admitting: Obstetrics & Gynecology

## 2024-06-13 DIAGNOSIS — R188 Other ascites: Secondary | ICD-10-CM | POA: Insufficient documentation

## 2024-06-13 DIAGNOSIS — N3289 Other specified disorders of bladder: Secondary | ICD-10-CM | POA: Diagnosis not present

## 2024-06-13 MED ORDER — IOHEXOL 350 MG/ML SOLN
75.0000 mL | Freq: Once | INTRAVENOUS | Status: AC | PRN
  Administered 2024-06-13: 75 mL via INTRAVENOUS

## 2024-06-13 NOTE — Progress Notes (Signed)
 "  Referring Physician(s): Arnold,James G  Chief Complaint: The patient is seen in follow up today s/p pelvic abscess  History of present illness:  [name] is a 38 y.o. female with history of GERD, MS, menorrhea s/p hysterectomy with right salpingectomy on 05/15/24 who presented to the [facility] 05/25/24 with abdominal pain.  CT Abdomen Pelvis showed concern for pelvic fluid collection, potential post-op abscess.  She underwent R TG abscess drain placement 05/26/24 by Dr. Karalee. She returns to [facility] today for assessment of her drain after >2 weeks in place.  She reports significantly reduced output.  She has required assistance for care related to the drain due to it's posterior position, however states that she has had multiple family members assist with drain care with no issues noted.  She has no abdominal pain and has been able to eat and drink per her usual.  Overall she is feeling better.  She is hopeful for drain removal.   Past Medical History:  Diagnosis Date   Abnormal Pap smear    Colpo>normal   Anxiety    B12 deficiency 06/27/2013   EGD and Duodenal bx done in 3/15 at [facility] not showing celiac or pernicious anemia     Child behavior problem 03/27/2016   [facility]) noted the patient is having difficulty managing her twin daughters behavior 03/27/2016     Cholestasis during pregnancy in third trimester 07/12/2020   07/11/20 Bile acids 13.8     Fibroid    GERD (gastroesophageal reflux disease)    Gonorrhea    Headache    last migraine 01/2017   History of IBS    watch diet, no meds   History of reversal of tubal ligation 12/20/2019   Done in [place]  [facility] - June 2020     Infection    UTI   Multiple sclerosis    Neuromuscular disorder (HCC)    diagnosed with MS this visit   Obesity    Ovarian cyst    Pernicious anemia 05/02/2013   Pregnancy induced hypertension    Preterm labor    all 3 deliveries   Urinary tract infection     Vaginal Pap smear, abnormal     Past Surgical History:  Procedure Laterality Date   CHOLECYSTECTOMY N/A 11/25/2017   Procedure: LAPAROSCOPIC CHOLECYSTECTOMY WITH INTRAOPERATIVE CHOLANGIOGRAM;  Surgeon: Eletha Boas, MD;  Location: [facility] ORS;  Service: General;  Laterality: N/A;   COLPOSCOPY     HYSTERECTOMY, VAGINAL, WITH SALPINGECTOMY  05/15/2024   Procedure: HYSTERECTOMY, VAGINAL, WITH RIGHT SALPINGECTOMY;  Surgeon: [name], MD;  Location: [facility] OR;  Service: Gynecology;;  [facility] BS   IR CATHETER TUBE CHANGE  05/31/2024   MYOMECTOMY N/A 04/01/2017   Procedure: Vaginal Myomectomy;  Surgeon: Herchel Gloris LABOR, MD;  Location: [facility] ORS;  Service: Gynecology;  Laterality: N/A;   TUBAL LIGATION     2012/ reversal June 2020   WISDOM TOOTH EXTRACTION      Allergies: Patient has no known allergies.  Medications: Prior to Admission medications  Medication Sig Start Date End Date Taking? Authorizing Provider  amoxicillin -clavulanate (AUGMENTIN ) 875-125 MG tablet Take 1 tablet by mouth 2 (two) times daily. 06/03/24   Zina Jerilynn LABOR, MD  apixaban  (ELIQUIS ) 5 MG TABS tablet Take 2 tablets (10 mg total) by mouth 2 (two) times daily for 4 days, THEN 1 tablet (5 mg total) 2 (two) times daily. 06/03/24 09/05/24  Zina Jerilynn LABOR, MD  cyanocobalamin  (VITAMIN B12) 1000 MCG/ML  injection Inject 1,000 mcg into the skin every 30 (thirty) days.    [provider]  cyclobenzaprine  (FLEXERIL ) 10 MG tablet Take 5-10 mg by mouth 3 (three) times daily as needed for muscle spasms.    [provider]  famotidine  (PEPCID ) 20 MG tablet Take 1 tablet (20 mg total) by mouth 2 (two) times daily. Patient taking differently: Take 20 mg by mouth daily as needed for heartburn or indigestion. 01/12/24   Desiderio Chew, PA-C  ferrous sulfate  325 (65 FE) MG tablet Take 1 tablet (325 mg total) by mouth every other day. 05/29/24   Erik Kieth BROCKS, MD  folic acid  (FOLVITE ) 1 MG tablet Take 1 tablet (1 mg total)  by mouth daily. 06/03/24   Zina Jerilynn LABOR, MD  ocrelizumab (OCREVUS) 300 MG/10ML injection Inject 300 mg into the vein every 6 (six) months.    [provider]  ondansetron  (ZOFRAN -ODT) 4 MG disintegrating tablet Take 1 tablet (4 mg total) by mouth every 6 (six) hours as needed for nausea. 05/16/24   Constant, Peggy, MD  oxyCODONE  (OXY IR/ROXICODONE ) 5 MG immediate release tablet Take 1 tablet (5 mg total) by mouth every 4 (four) hours as needed for severe pain (pain score 7-10) or breakthrough pain. 06/03/24   Zina Jerilynn LABOR, MD  pantoprazole  (PROTONIX ) 40 MG tablet Take 40 mg by mouth daily as needed (acid reflux).    [provider]  sodium chloride  flush 0.9 % SOLN injection Please flush catheter daily with 5-10 mL of saline. 05/28/24 08/02/24  Carim, Charles A, PA-C  Vitamin D , Ergocalciferol , (DRISDOL ) 1.25 MG (50000 UNIT) CAPS capsule Take 50,000 Units by mouth once a week.    [provider]     Family History  Problem Relation Age of Onset   Diabetes Mother    Hypertension Mother    Diabetes Father    Hypertension Father    Kidney disease Father    Stroke Father    Learning disabilities Brother    Anesthesia problems Neg Hx    Other Neg Hx     Social History   Socioeconomic History   Marital status: Single    Spouse name: Not on file   Number of children: 4   Years of education: 13   Highest education level: Not on file  Occupational History   Occupation: UNEMPLOYED-DISABLE    Comment: [employer]  Tobacco Use   Smoking status: Never   Smokeless tobacco: Never  Vaping Use   Vaping status: Never Used  Substance and Sexual Activity   Alcohol use: No    Alcohol/week: 0.0 standard drinks of alcohol    Comment:     Drug use: No    Comment: denies use, states was given CBD gummies for nausea   Sexual activity: Yes    Partners: Male    Birth control/protection: Injection  Other Topics Concern   Not on file  Social History Narrative    Lives with 4 children. Good support group in her community including mother who lives close.    Social Drivers of Health   Tobacco Use: Low Risk (05/25/2024)   Patient History    Smoking Tobacco Use: Never    Smokeless Tobacco Use: Never    Passive Exposure: Not on file  Financial Resource Strain: Not on file  Food Insecurity: No Food Insecurity (05/27/2024)   Epic    Worried About Programme Researcher, Broadcasting/film/video in the Last Year: Never true    Ran Out  of Food in the Last Year: Never true  Transportation Needs: No Transportation Needs (05/27/2024)   Epic    Lack of Transportation (Medical): No    Lack of Transportation (Non-Medical): No  Physical Activity: Not on file  Stress: Not on file  Social Connections: Not on file  Depression (PHQ2-9): Low Risk (12/15/2023)   Depression (PHQ2-9)    PHQ-2 Score: 3  Alcohol Screen: Not on file  Housing: Low Risk (05/27/2024)   Epic    Unable to Pay for Housing in the Last Year: No    Number of Times Moved in the Last Year: 0    Homeless in the Last Year: No  Utilities: Not At Risk (05/27/2024)   Epic    Threatened with loss of utilities: No  Health Literacy: Not on file     Vital Signs: There were no vitals taken for this visit.  Physical Exam NAD, alert Abdomen: soft, non-tender. TG right-sided drain in place.  Insertion site intact, clean, and dry.  Dressing in place.  Small amount of thin, serosanguinous output in bulb.   Imaging: No results found.  Labs:  CBC: Recent Labs    05/31/24 0500 06/01/24 0817 06/02/24 0500 06/03/24 0704  WBC 13.0* 14.4* 13.1* 13.1*  HGB 9.5* 9.7* 9.2* 10.8*  HCT 29.9* 31.0* 29.6* 34.2*  PLT 673* 695* 684* 744*    COAGS: Recent Labs    05/30/24 1620 05/31/24 0500  INR 1.1 1.1  APTT 41*  --     BMP: Recent Labs    05/30/24 0528 05/31/24 0500 06/01/24 0817 06/02/24 0500  NA 137 137 137 144  K 3.8 4.3 4.3 3.9  CL 100 101 102 104  CO2 27 27 28 24   GLUCOSE 111* 91 98 87  BUN 5* 8 6  7   CALCIUM  8.5* 8.9 9.2 8.5*  CREATININE 1.01* 0.99 0.92 0.97  GFRNONAA >60 >60 >60 >60    LIVER FUNCTION TESTS: Recent Labs    05/30/24 0528 05/31/24 0500 06/01/24 0817 06/02/24 0500  BILITOT 1.2 0.9 0.7 0.5  AST 50* 72* 41 30  ALT 86* 102* 86* 60*  ALKPHOS 172* 263* 233* 201*  PROT 5.7* 6.1* 6.3* 5.9*  ALBUMIN  2.2* 3.2* 3.3* 3.1*    Assessment: Pelvic abscess s/p drain placement 05/26/24 Patient seen in follow-up today related to her R TG drain.  She reports overall improvement in her clinical status.   Reports decreased drain output.  CT Abdomen Pelvis obtained today and reviewed by Dr. Philip who notes resolve of the abscess.  Based on imaging and report of improvement, ok to proceed with drain removal.   Drain removed at bedside without complication.  Dressing replaced.  Care instructions given. Patient verbalizes understanding.    No further follow-up needed with IR at this time.   Signed: Sarajean Dessert Sue-Ellen Ladarrell Cornwall, PA 06/13/2024, 2:34 PM   Please refer to Dr. Philip attestation of this note for management and plan.        "

## 2024-06-19 ENCOUNTER — Other Ambulatory Visit

## 2024-06-19 ENCOUNTER — Ambulatory Visit: Admitting: Podiatry

## 2024-06-19 ENCOUNTER — Ambulatory Visit (INDEPENDENT_AMBULATORY_CARE_PROVIDER_SITE_OTHER)

## 2024-06-19 DIAGNOSIS — M7751 Other enthesopathy of right foot: Secondary | ICD-10-CM

## 2024-06-19 DIAGNOSIS — M205X1 Other deformities of toe(s) (acquired), right foot: Secondary | ICD-10-CM | POA: Diagnosis not present

## 2024-06-19 DIAGNOSIS — M21611 Bunion of right foot: Secondary | ICD-10-CM

## 2024-06-19 MED ORDER — TRIAMCINOLONE ACETONIDE 10 MG/ML IJ SUSP
5.0000 mg | Freq: Once | INTRAMUSCULAR | Status: AC
  Administered 2024-06-19: 5 mg

## 2024-06-19 NOTE — Progress Notes (Signed)
 "  Subjective:  Patient ID: [name], female    DOB: [birth year],  MRN: [id number]  Chief Complaint  Patient presents with   Foot Pain    R foot pain from great toe to ankle dorsal. Bunion is painful to touch. Not diabetic. Eliquis .     Discussed the use of AI scribe software for clinical note transcription with the patient, who gave verbal consent to proceed.  History of Present Illness [name] is a 39 year old female with right foot bunion deformity and hallux rigidus who presents with chronic right foot pain and limited motion.  For one year, she has had progressively worsening pain in the right great toe and first metatarsophalangeal joint, described as aching, throbbing, and intermittently sharp with episodes of severe pain that can feel angry and throbbing. The pain radiates proximally within the foot only.  Pain is worsened by certain footwear, especially leather boots. She notes the toe sometimes feels stuck with decreased range of motion, particularly dorsiflexion, and day-to-day fluctuation in symptom severity. She denies trauma and has no similar symptoms in other joints.  She is taking a blood thinner for prior deep vein thrombosis, expected to stop in about a month. She does not use tobacco or vaping products.      Objective:    Physical Exam MUSCULOSKELETAL: Decreased range of motion in the right foot with dorsiflexion.  Mild to moderate bunion deformity with medial eminence and periarticular spurring noted.  Slight lateral drift of first toe.  Muscle strength 5/5 in dorsiflexion, plantarflexion inversion and eversion DP and PT pulses palpable 2/4 bilaterally.  No edema.  Cap refill intact to the digits Light touch and protective sensation intact Pedal skin well-hydrated.   No images are attached to the encounter.    Results Radiology Right foot x-ray: Hallux valgus deformity, osteophyte formation of the first metatarsal, mild joint space narrowing  of the first metatarsophalangeal joint, no significant lateral deviation of the hallux (Independently interpreted)   Assessment:   1. Bunion of great toe of right foot   2. Capsulitis of metatarsophalangeal (MTP) joint of right foot   3. Hallux limitus, right      Plan:  Patient was evaluated and treated and all questions answered.  Assessment and Plan Assessment & Plan Right foot bunion deformity with hallux limitus Chronic bunion deformity with hallux limitus causing progressive pain and decreased range of motion. Radiographs show mild deformity, metatarsal spurring, and early joint space narrowing indicating early osteoarthritis. Minimal lateral hallux drift noted. Conservative management indicated to minimize irritation and slow progression. Surgery considered if symptoms worsen or become functionally limiting. - Recommended shoes with soft or mesh uppers to reduce bunion irritation. - Provided bunion shield to protect the bunion prominence. - Recommended shoe inserts/orthotics for arch support and hindfoot control; discussed both custom and over-the-counter options. - Instructed on gradual increase in use of new insoles, starting with one hour per day and increasing as tolerated. - Provided education on appropriate footwear brands and avoidance of aggravating footwear, such as leather boots. - Scheduled follow-up in 4-6 weeks to assess response to conservative measures and after discontinuation of anticoagulation.  Capsulitis of right first metatarsophalangeal joint Capsulitis causing intermittent pain, throbbing, and decreased range of motion. Joint is acutely inflamed and symptomatic. Corticosteroid injection recommended for acute inflammation and pain control, with NSAIDs considered post-anticoagulation. - Administered corticosteroid injection with local anesthetic to the right first metatarsophalangeal joint. - Advised that the area may be numb for  several hours post-injection and  to keep the Band-Aid in place for 24 hours. - Discussed use of NSAIDs (ibuprofen , naproxen ) for pain management after discontinuation of anticoagulation. - Scheduled follow-up in 4-6 weeks to reassess symptoms and response to injection and conservative measures.  Procedure: Injection small joint Discussed alternatives, risks, complications and verbal consent was obtained.  Location: Right first MPJ. Skin Prep: Betadine . Injectate: 0.75 cc 0.5% marcaine  plain, 0.75 cc kenalog   Disposition: Patient tolerated procedure well. Injection site dressed with a band-aid.  Post-injection care was discussed and return precautions discussed.        Return in about 6 weeks (around 07/31/2024) for bunion.     "

## 2024-06-19 NOTE — Patient Instructions (Addendum)
Hallux Limitus Hallux limitus is a condition involving pain and a loss of motion of the first (big) toe. The pain gets worse with lifting up (extension) of the toe. This is usually due to arthritic bony bumps (spurring) of the joint at the base of the big toe.  SYMPTOMS   Pain, with lifting up of the toe.  Tenderness over the joint where the big toe meets the foot.  Redness, swelling, and warmth over the top of the base of the big toe (sometimes).  Foot pain, stiffness, and limping. CAUSES  Hallux limitus is caused by arthritis of the joint where the big toe meets the foot. The arthritis creates a bone spur that pinches the soft tissues when the toe is extended. RISK INCREASES WITH:  Tight shoes with a narrow toe box.  Family history of foot problems.  Gout and rheumatoid and psoriatic arthritis.  History of previous toe injury, including "turf toe."  Long first toe, flat feet, and other big toe bony bumps.  Arthritis of the big toe. PREVENTION   Wear wide-toed shoes that fit well.  Tape the big toe to reduce motion and to prevent pinching of the tissues between the bone.  Maintain physical fitness:  Foot and ankle flexibility.  Muscle strength and endurance. PROGNOSIS  This condition can usually be managed with proper treatment. However, surgery is typically required to prevent the problem from recurring.  RELATED COMPLICATIONS  Injury to other areas of the foot or ankle, caused by abnormal walking in an attempt to avoid the pain felt when walking normally. TREATMENT Treatment first involves stopping the activities that aggravate your symptoms. Ice and medicine can be used to reduce the pain and inflammation. Modifications to shoes may help reduce pain, including wearing stiff-soled shoes, shoes with a wide toe box, inserting a padded donut to relieve pressure on top of the joint, or wearing an arch support. Corticosteroid injections may be given to reduce inflammation. If  nonsurgical treatment is unsuccessful, surgery may be needed. Surgical options include removing the arthritic bony spur, cutting a bone in the foot to change the arc of motion (allowing the toe to extend more), or fusion of the joint (eliminating all motion in the joint at the base of the big toe).  MEDICATION   If pain medicine is needed, nonsteroidal anti-inflammatory medicines (aspirin and ibuprofen), or other minor pain relievers (acetaminophen), are often advised.  Do not take pain medicine for 7 days before surgery.  Prescription pain relievers are usually prescribed only after surgery. Use only as directed and only as much as you need.  Ointments for arthritis, applied to the skin, may give some relief.  Injections of corticosteroids may be given to reduce inflammation. HEAT AND COLD  Cold treatment (icing) relieves pain and reduces inflammation. Cold treatment should be applied for 10 to 15 minutes every 2 to 3 hours, and immediately after activity that aggravates your symptoms. Use ice packs or an ice massage.  Heat treatment may be used before performing the stretching and strengthening activities prescribed by your caregiver, physical therapist, or athletic trainer. Use a heat pack or a warm water soak. SEEK MEDICAL CARE IF:   Symptoms get worse or do not improve in 2 weeks, despite treatment.  After surgery you develop fever, increasing pain, redness, swelling, drainage of fluids, bleeding, or increasing warmth.  New, unexplained symptoms develop.    

## 2024-06-24 ENCOUNTER — Encounter: Payer: Self-pay | Admitting: Podiatry

## 2024-06-28 ENCOUNTER — Encounter: Payer: Self-pay | Admitting: Obstetrics & Gynecology

## 2024-06-28 ENCOUNTER — Ambulatory Visit: Payer: Self-pay | Admitting: Obstetrics & Gynecology

## 2024-06-28 VITALS — BP 130/83 | HR 66 | Ht 64.0 in | Wt 244.0 lb

## 2024-06-28 DIAGNOSIS — N739 Female pelvic inflammatory disease, unspecified: Secondary | ICD-10-CM

## 2024-06-28 DIAGNOSIS — Z90710 Acquired absence of both cervix and uterus: Secondary | ICD-10-CM

## 2024-06-28 NOTE — Progress Notes (Signed)
 Subjective: feeling well postop     [name] is a 39 y.o. female who presents to the clinic 7 weeks status post vaginal hysterectomy for abnormal uterine bleeding. Eating a regular diet without difficulty. Bowel movements are normal. The patient is not having any pain. She had pelvic abscess and ovarian vein thrombosis and require pelvic drainage. Drain removed on 12/30, abx course is complete and will likely stop Eliquis  soon The following portions of the patient's history were reviewed and updated as appropriate: allergies, current medications, past family history, past medical history, past social history, past surgical history, and problem list.  Review of Systems Pertinent items are noted in HPI.    Objective:    BP 130/83   Pulse 66   Ht 5' 4 (1.626 m)   Wt 244 lb (110.7 kg)   LMP 04/24/2024 (Approximate)   BMI 41.88 kg/m  General:  alert, cooperative, and no distress  Abdomen: Soft non-tender  Incision:   Drain site at right buttock well healing    Pelvic exam: normal external genitalia, vulva, vagina, cervix, uterus and adnexa, VULVA: normal appearing vulva with no masses, tenderness or lesions, VAGINA: normal appearing vagina with normal color and discharge, no lesions, CERVIX: surgically absent. Cuff intact and not tender, small suture fragments noted, no discharge or  blood  Assessment:    Doing well postoperatively. Operative findings again reviewed. Pathology report discussed.    Plan:    1. Continue any current medications. 2. Wound care discussed. 3. Activity restrictions: pelvic rest 2 more weeks 4. Anticipated return to work: now. 5. Follow up: 6 mo   [name], MD 06/28/2024

## 2024-06-28 NOTE — Progress Notes (Signed)
 39 y.o. GYN presents for Post OP.  C/o hot flashes.

## 2024-07-27 ENCOUNTER — Ambulatory Visit: Admitting: Podiatry
